# Patient Record
Sex: Female | Born: 1950 | Race: Black or African American | Hispanic: No | Smoking: Current every day smoker
Health system: Southern US, Community
[De-identification: ages and names within clinical notes are randomized; demographics above are authoritative.]

## PROBLEM LIST (undated history)

## (undated) DIAGNOSIS — I452 Bifascicular block: Secondary | ICD-10-CM

## (undated) DIAGNOSIS — M419 Scoliosis, unspecified: Secondary | ICD-10-CM

## (undated) DIAGNOSIS — F329 Major depressive disorder, single episode, unspecified: Secondary | ICD-10-CM

## (undated) DIAGNOSIS — G4700 Insomnia, unspecified: Secondary | ICD-10-CM

## (undated) DIAGNOSIS — I499 Cardiac arrhythmia, unspecified: Secondary | ICD-10-CM

## (undated) DIAGNOSIS — F32A Depression, unspecified: Secondary | ICD-10-CM

## (undated) DIAGNOSIS — F419 Anxiety disorder, unspecified: Secondary | ICD-10-CM

## (undated) DIAGNOSIS — I10 Essential (primary) hypertension: Secondary | ICD-10-CM

## (undated) DIAGNOSIS — I5032 Chronic diastolic (congestive) heart failure: Secondary | ICD-10-CM

## (undated) HISTORY — DX: Depression, unspecified: F32.A

## (undated) HISTORY — PX: BACK SURGERY: SHX140

## (undated) HISTORY — DX: Major depressive disorder, single episode, unspecified: F32.9

## (undated) HISTORY — PX: TONSILLECTOMY: SUR1361

## (undated) HISTORY — DX: Insomnia, unspecified: G47.00

## (undated) HISTORY — DX: Cardiac arrhythmia, unspecified: I49.9

## (undated) HISTORY — DX: Anxiety disorder, unspecified: F41.9

---

## 2004-08-18 ENCOUNTER — Ambulatory Visit: Payer: Self-pay | Admitting: Internal Medicine

## 2004-09-06 ENCOUNTER — Ambulatory Visit: Payer: Self-pay | Admitting: Internal Medicine

## 2007-09-19 ENCOUNTER — Ambulatory Visit: Payer: Self-pay | Admitting: Hospitalist

## 2007-09-19 ENCOUNTER — Encounter (INDEPENDENT_AMBULATORY_CARE_PROVIDER_SITE_OTHER): Payer: Self-pay | Admitting: Hospitalist

## 2007-09-19 ENCOUNTER — Ambulatory Visit: Payer: Self-pay | Admitting: Cardiovascular Disease

## 2007-09-19 ENCOUNTER — Inpatient Hospital Stay (HOSPITAL_COMMUNITY): Admission: EM | Admit: 2007-09-19 | Discharge: 2007-09-24 | Payer: Self-pay | Admitting: Emergency Medicine

## 2007-09-20 ENCOUNTER — Encounter (INDEPENDENT_AMBULATORY_CARE_PROVIDER_SITE_OTHER): Payer: Self-pay | Admitting: Hospitalist

## 2007-11-05 ENCOUNTER — Emergency Department (HOSPITAL_COMMUNITY): Admission: EM | Admit: 2007-11-05 | Discharge: 2007-11-05 | Payer: Self-pay | Admitting: Emergency Medicine

## 2007-11-13 ENCOUNTER — Inpatient Hospital Stay (HOSPITAL_COMMUNITY): Admission: EM | Admit: 2007-11-13 | Discharge: 2007-11-15 | Payer: Self-pay | Admitting: Emergency Medicine

## 2008-02-02 ENCOUNTER — Emergency Department (HOSPITAL_COMMUNITY): Admission: EM | Admit: 2008-02-02 | Discharge: 2008-02-02 | Payer: Self-pay | Admitting: Emergency Medicine

## 2008-04-21 ENCOUNTER — Emergency Department (HOSPITAL_COMMUNITY): Admission: EM | Admit: 2008-04-21 | Discharge: 2008-04-21 | Payer: Self-pay | Admitting: Emergency Medicine

## 2010-07-07 ENCOUNTER — Encounter: Admission: RE | Admit: 2010-07-07 | Discharge: 2010-07-07 | Payer: Self-pay | Admitting: Geriatric Medicine

## 2010-09-14 ENCOUNTER — Ambulatory Visit: Admit: 2010-09-14 | Payer: Self-pay | Admitting: Internal Medicine

## 2010-09-20 ENCOUNTER — Encounter (INDEPENDENT_AMBULATORY_CARE_PROVIDER_SITE_OTHER): Payer: Self-pay | Admitting: *Deleted

## 2010-09-20 ENCOUNTER — Ambulatory Visit
Admission: RE | Admit: 2010-09-20 | Discharge: 2010-09-20 | Payer: Self-pay | Source: Home / Self Care | Attending: Internal Medicine | Admitting: Internal Medicine

## 2010-09-27 ENCOUNTER — Ambulatory Visit: Admit: 2010-09-27 | Payer: Self-pay | Admitting: Internal Medicine

## 2010-09-29 NOTE — Letter (Signed)
Summary: New Patient letter  [facility]  [address] [place], [place] [address]   Phone: [phone]  Fax: [phone]       09/20/2010 MRN: [id number]  Heidi Blevins [address] [place], [place]  [address]  Dear Heidi Blevins,  Welcome to the Gastroenterology Division at [facility].    You are scheduled to see Dr.  Leone Payor on 10-20-10 at 2:30P.M. on the 3rd floor at [facility], [address]  We ask that you try to arrive at our office 15 minutes prior to your appointment time to allow for check-in.  We would like you to complete the enclosed self-administered evaluation form prior to your visit and bring it with you on the day of your appointment.  We will review it with you.  Also, please bring a complete list of all your medications or, if you prefer, bring the medication bottles and we will list them.  Please bring your insurance card so that we may make a copy of it.  If your insurance requires a referral to see a specialist, please bring your referral form from your primary care physician.  Co-payments are due at the time of your visit and may be paid by cash, check or credit card.     Your office visit will consist of a consult with your physician (includes a physical exam), any laboratory testing he/she may order, scheduling of any necessary diagnostic testing (e.g. x-ray, ultrasound, CT-scan), and scheduling of a procedure (e.g. Endoscopy, Colonoscopy) if required.  Please allow enough time on your schedule to allow for any/all of these possibilities.    If you cannot keep your appointment, please call [phone] to cancel or reschedule prior to your appointment date.  This allows [place] the opportunity to schedule an appointment for another patient in need of care.  If you do not cancel or reschedule by 5 p.m. the business day prior to your appointment date, you will be charged a $50.00 late cancellation/no-show fee.    Thank you for  choosing [facility] for your medical needs.  We appreciate the opportunity to care for you.  Please visit [place] at our website  to learn more about our practice.                     Sincerely,                                                             The Gastroenterology Division

## 2010-09-29 NOTE — Miscellaneous (Signed)
Summary: PV for colon-needs OV  Clinical Lists Changes   Pt is from assisted living facility.  Here for screening colon.  Pt on multiple psychotrophic drugs.  Also c/o severe abdominal bloating.  Colonoscopy cancelled and scheduled for new patient appt w/ Dr. Leone Payor for 10/20/2010 at 2:30.

## 2010-10-20 ENCOUNTER — Encounter: Payer: Self-pay | Admitting: Internal Medicine

## 2010-10-20 ENCOUNTER — Ambulatory Visit (INDEPENDENT_AMBULATORY_CARE_PROVIDER_SITE_OTHER): Payer: Medicare Other | Admitting: Internal Medicine

## 2010-10-20 ENCOUNTER — Encounter (INDEPENDENT_AMBULATORY_CARE_PROVIDER_SITE_OTHER): Payer: Self-pay | Admitting: *Deleted

## 2010-10-20 DIAGNOSIS — R141 Gas pain: Secondary | ICD-10-CM

## 2010-10-20 DIAGNOSIS — R142 Eructation: Secondary | ICD-10-CM

## 2010-10-20 DIAGNOSIS — R143 Flatulence: Secondary | ICD-10-CM | POA: Insufficient documentation

## 2010-10-20 DIAGNOSIS — Z1211 Encounter for screening for malignant neoplasm of colon: Secondary | ICD-10-CM

## 2010-10-25 NOTE — Letter (Signed)
Summary: [facility] Instructions  [facility]  [address] [place], [place] [address]   Phone: [phone]  Fax: [phone]       Heidi Blevins    [birth year]    MRN: [id number]        Procedure Day /Date:11/01/10 TUE     Arrival Time:1230 pm     Procedure Time:130 pm    Location of Procedure:                    X  [facility] (4th Floor)   PREPARATION FOR COLONOSCOPY WITH MOVIPREP   Starting 5 days prior to your procedure 10/27/10 do not eat nuts, seeds, popcorn, corn, beans, peas,  salads, or any raw vegetables.  Do not take any fiber supplements (e.g. Metamucil, Citrucel, and Benefiber).  THE DAY BEFORE YOUR PROCEDURE         DATE: 10/31/10  DAY:MON  1.  Drink clear liquids the entire day-NO SOLID FOOD  2.  Do not drink anything colored red or purple.  Avoid juices with pulp.  No orange juice.  3.  Drink at least 64 oz. (8 glasses) of fluid/clear liquids during the day to prevent dehydration and help the prep work efficiently.  CLEAR LIQUIDS INCLUDE: Water Jello Ice Popsicles Tea (sugar ok, no milk/cream) Powdered fruit flavored drinks Coffee (sugar ok, no milk/cream) Gatorade Juice: apple, white grape, white cranberry  Lemonade Clear bullion, consomm, broth Carbonated beverages (any kind) Strained chicken noodle soup Hard Candy                             4.  In the morning, mix first dose of MoviPrep solution:    Empty 1 Pouch A and 1 Pouch B into the disposable container    Add lukewarm drinking water to the top line of the container. Mix to dissolve    Refrigerate (mixed solution should be used within 24 hrs)  5.  Begin drinking the prep at 5:00 p.m. The MoviPrep container is divided by 4 marks.   Every 15 minutes drink the solution down to the next mark (approximately 8 oz) until the full liter is complete.   6.  Follow completed prep with 16 oz of clear liquid of your choice (Nothing red or purple).  Continue to drink  clear liquids until bedtime.  7.  Before going to bed, mix second dose of MoviPrep solution:    Empty 1 Pouch A and 1 Pouch B into the disposable container    Add lukewarm drinking water to the top line of the container. Mix to dissolve    Refrigerate  THE DAY OF YOUR PROCEDURE      DATE: 11/01/10 DAY: TUE  Beginning at 830 a.m. (5 hours before procedure):         1. Every 15 minutes, drink the solution down to the next mark (approx 8 oz) until the full liter is complete.  2. Follow completed prep with 16 oz. of clear liquid of your choice.    3. You may drink clear liquids until 1130 am (2 HOURS BEFORE PROCEDURE).   MEDICATION INSTRUCTIONS  Unless otherwise instructed, you should take regular prescription medications with a small sip of water   as early as possible the morning of your procedure.        OTHER INSTRUCTIONS  You will need a responsible adult at least 60 years of age to accompany you  and drive you home.   This person must remain in the waiting room during your procedure.  Wear loose fitting clothing that is easily removed.  Leave jewelry and other valuables at home.  However, you may wish to bring a book to read or  an iPod/MP3 player to listen to music as you wait for your procedure to start.  Remove all body piercing jewelry and leave at home.  Total time from sign-in until discharge is approximately 2-3 hours.  You should go home directly after your procedure and rest.  You can resume normal activities the  day after your procedure.  The day of your procedure you should not:   Drive   Make legal decisions   Operate machinery   Drink alcohol   Return to work  You will receive specific instructions about eating, activities and medications before you leave.    The above instructions have been reviewed and explained to me by   _______________________    I fully understand and can verbalize these instructions _____________________________  Date _________

## 2010-10-25 NOTE — Assessment & Plan Note (Signed)
Summary: colon cx.  needs OV for abd bloating, need for procedure.   History of Present Illness Visit Type: Initial Visit Primary GI MD: Stan Head MD [facility] Primary Provider: Florentina Jenny, MD Chief Complaint: Colon screening, abdominal bloating History of Present Illness:   60 yo African-American woman, resides in an assisted lving center. She is complaining of intermittent bloating and abdominal distention as well as gas pain. This started about 1 month ago. No medication changes then. Has never had this before. No diet change.  Staff member here says some days her abdomen is very distended. Passage of flatus will relieve the distention.     GI Review of Systems    Reports bloating.      Denies abdominal pain, acid reflux, belching, chest pain, dysphagia with liquids, dysphagia with solids, heartburn, loss of appetite, nausea, vomiting, vomiting blood, weight loss, and  weight gain.        Denies anal fissure, black tarry stools, change in bowel habit, constipation, diarrhea, diverticulosis, fecal incontinence, heme positive stool, hemorrhoids, irritable bowel syndrome, jaundice, light color stool, liver problems, rectal bleeding, and  rectal pain. Preventive Screening-Counseling & Management  Alcohol-Tobacco     Smoking Status: current      Drug Use:  no.      Current Medications (verified): 1)  Fluphenazine Hcl 5 Mg Tabs (Fluphenazine Hcl) .... 2 By Mouth At Bedtime 2)  Lithium Carbonate 450 Mg Cr-Tabs (Lithium Carbonate) .Marland Kitchen.. 1 By Mouth Two Times A Day 3)  Zyprexa 10 Mg Tabs (Olanzapine) .... 3 By Mouth At Bedtime 4)  Lorazepam 1 Mg Tabs (Lorazepam) .Marland Kitchen.. 1 By Mouth Three Times A Day 5)  Flonase 50 Mcg/act Susp (Fluticasone Propionate) .... Inhale 1 Spray Into Each Nostril Two Times A Day 6)  Vitamin D3 1000 Unit Tabs (Cholecalciferol) .Marland Kitchen.. 1 By Mouth Once Daily 7)  Cartia Xt 180 Mg Xr24h-Cap (Diltiazem Hcl Coated Beads) .Marland Kitchen.. 1 By Mouth Once Daily 8)  Cranberry 400 Mg  Caps (Cranberry) .Marland Kitchen.. 1 By Mouth Once Daily 9)  Zolpidem Tartrate 10 Mg Tabs (Zolpidem Tartrate) .Marland Kitchen.. 1 By Mouth At Bedtime 10)  Sertraline Hcl 100 Mg Tabs (Sertraline Hcl) .Marland Kitchen.. 1 By Mouth At Bedtime 11)  Nystatin 100000 Unit/gm Crea (Nystatin) .... Apply To Afected Area Twice Daily As Needed 12)  Mucinex 600 Mg Xr12h-Tab (Guaifenesin) .Marland Kitchen.. 1 By Mouth Every 12 Hours As Needed For Cough 13)  Acetaminophen 325 Mg Tabs (Acetaminophen) .... 2 By Mouth Every Six Hours As Needed 14)  Fluphenazine Hcl 5 Mg Tabs (Fluphenazine Hcl) .... Take 2 By Mouth  Every Morning 15)  Ibuprofen 600 Mg Tabs (Ibuprofen) .... Take 1 By Mouth Every 6 Hours As Needed For Back Pain  Allergies (verified): 1)  ! Sulfa  Past History:  Past Medical History: schizoaffective disorder insomnia  anxiety depression vit d def  Arrhythmia  Past Surgical History: Back Surgery - fell out of tree age 34  Family History: No FH of Colon Cancer:  Social History: Patient currently smokes.  Alcohol Use - no Daily Caffeine Use Illicit Drug Use - no Lives in assisted living, makes own decisions.Smoking Status:  current Drug Use:  no  Review of Systems       The patient complains of back pain, heart rhythm changes, and sleeping problems.         All other ROS negative except as per HPI.   Vital Signs:  Patient profile:   60 year old female Height:  64 inches Weight:      167.50 pounds BMI:     28.86 Pulse rate:   72 / minute Pulse rhythm:   regular BP sitting:   126 / 78  (left arm) Cuff size:   regular  Vitals Entered By: June McMurray CMA Duncan Dull) (October 20, 2010 2:26 PM)  Physical Exam  General:  Well developed, well nourished, no acute distress. Eyes:  PERRLA, no icterus. bilateral arcus Mouth:  dentures, otherwise clear Neck:  Supple; no masses or thyromegaly. Lungs:  Clear throughout to auscultation. Heart:  Regular rate and rhythm; no murmurs, rubs,  or bruits. Abdomen:  soft, decreased  abdominal wall tone, moderate diastasis recti BS+, no fluid wave, nontender, no HSM or mass Rectal:  deferred until time of colonoscopy.   Extremities:  no edema Neurologic:  Alert and  oriented x3 Psych:  speech slightly slow but oherwise she is appropriate and with normal affect   Impression & Recommendations:  Problem # 1:  ABDOMINAL BLOATING (ICD-787.3) Assessment New No red flags to report. Colonoscopy is for screening but will await that before further evaluation.  Problem # 2:  SCREENING, COLON CANCER (ICD-V76.51) Assessment: New  Risks, benefits,and indications of endoscopic procedure(s) were reviewed with the patient and all questions answered.  Orders: Colonoscopy (Colon)  Patient Instructions: 1)  Colonoscopy brochure given.  2)  You have been scheduled for a Colonoscopy for 11/01/10. 3)  Instructions have been provided. 4)  Your prescription for your Movi prep has been  printed and handed to you. 5)  The medication list was reviewed and reconciled.  All changed / newly prescribed medications were explained.  A complete medication list was provided to the patient / caregiver. Prescriptions: MOVIPREP 100 GM  SOLR (PEG-KCL-NACL-NASULF-NA ASC-C) As per prep instructions.  #1 x 0   Entered by:   Chales Abrahams CMA (AAMA)   Authorized by:   Iva Boop MD, [facility]   Signed by:   Chales Abrahams CMA (AAMA) on 10/20/2010   Method used:   Print then Give to Patient   RxID:   [id number] MOVIPREP 100 GM  SOLR (PEG-KCL-NACL-NASULF-NA ASC-C) As per prep instructions.  #1 x 0   Entered by:   Chales Abrahams CMA (AAMA)   Authorized by:   Iva Boop MD, [facility]   Signed by:   Chales Abrahams CMA (AAMA) on 10/20/2010   Method used:   Historical   RxID:   [id number]  cc: Dr. Florentina Jenny

## 2010-11-01 ENCOUNTER — Other Ambulatory Visit: Payer: Medicare Other | Admitting: Internal Medicine

## 2010-11-30 ENCOUNTER — Encounter: Payer: Self-pay | Admitting: Internal Medicine

## 2010-12-01 ENCOUNTER — Encounter: Payer: Self-pay | Admitting: Internal Medicine

## 2010-12-01 ENCOUNTER — Ambulatory Visit (AMBULATORY_SURGERY_CENTER): Payer: Medicare Other | Admitting: Internal Medicine

## 2010-12-01 VITALS — BP 126/76 | HR 74 | Temp 98.7°F | Resp 18 | Ht 65.0 in | Wt 166.0 lb

## 2010-12-01 DIAGNOSIS — D126 Benign neoplasm of colon, unspecified: Secondary | ICD-10-CM

## 2010-12-01 DIAGNOSIS — K635 Polyp of colon: Secondary | ICD-10-CM

## 2010-12-01 DIAGNOSIS — K5730 Diverticulosis of large intestine without perforation or abscess without bleeding: Secondary | ICD-10-CM

## 2010-12-01 DIAGNOSIS — Z1211 Encounter for screening for malignant neoplasm of colon: Secondary | ICD-10-CM

## 2010-12-01 MED ORDER — SODIUM CHLORIDE 0.9 % IV SOLN: 500.0000 mL | INTRAVENOUS | Status: DC

## 2010-12-01 MED ORDER — IBUPROFEN 600 MG PO TABS: 600.0000 mg | ORAL_TABLET | Freq: Four times a day (QID) | ORAL | Status: DC | PRN

## 2010-12-01 NOTE — Progress Notes (Signed)
Mild diverticulosis, polyp

## 2010-12-01 NOTE — Patient Instructions (Addendum)
6 polyps were removed today. They appear benign. No aspirin or NSAID-anti-inflammatory medications until December 15, 2010.

## 2010-12-01 NOTE — Progress Notes (Signed)
Hand off report to Eual Fines RN

## 2010-12-05 ENCOUNTER — Telehealth: Payer: Self-pay | Admitting: *Deleted

## 2010-12-05 NOTE — Telephone Encounter (Signed)

## 2010-12-12 ENCOUNTER — Encounter: Payer: Self-pay | Admitting: Internal Medicine

## 2010-12-12 NOTE — Progress Notes (Signed)
Quick Note:  5 adenomas so recall colonoscopy 3 years - 11/2013 ______

## 2011-01-10 NOTE — Procedures (Signed)
EEG NUMBER:  [id number].   HISTORY:  This is a 60 year old patient with episodes of altered mental  status with drowsiness.  Patient is being evaluated for possible seizure  events.  This is a routine EEG.  No skull defects were noted.   MEDICATIONS:  1. Protonix.  2. Lovenox.  3. Diflucan.  4. Zithromax.   EEG CLASSIFICATION:  Normal awake and drowsy.   DESCRIPTION OF RECORDING:  Background rhythm:  This recording consists  of a fairly well modulated medium amplitude alpha rhythm of 9 Hz that is  reactive to eye opening and closure.  As the record progresses, the  patient appears initially to be in an awakened state.  Off and on during  the recording, the patient seems to enter the drowsy state with a  dropout of the background rhythm activities and onset of 7 rate Hz  background slowing seen.  The patient never clearly enters stage II  sleep at any time.  Towards the end of the recording, photic stimulation  is performed resulting in a bilateral and symmetric photic drive  response.  Hyperventilation was not performed.  At no time during the  recording did it appear rhythms of spike wave discharges or evidence of  focal slowing.  EKG monitor shows no evidence of cardiac rhythm  abnormalities with a heart rate of 72.   IMPRESSION:  This is a normal EEG recording awake and drowsy state.  No  evidence of ictal or interictal discharges were seen.      Marlan Palau, M.D.  Electronically Signed     ZOX:WRUE  D:  09/20/2007 13:08:08  T:  09/20/2007 14:37:16  Job #:  [id number]

## 2011-01-10 NOTE — Consult Note (Signed)
NAMESHELBYLYNN, WALCZYK NO.:  [id number]   MEDICAL RECORD NO.:  [id number]          PATIENT TYPE:  INP   LOCATION:  1828                         FACILITY:  [facility]   PHYSICIAN:  Marlan Palau, M.D.  DATE OF BIRTH:  07-10-1951   DATE OF CONSULTATION:  09/20/2007  DATE OF DISCHARGE:                                 CONSULTATION   HISTORY OF PRESENT ILLNESS:  Heidi Blevins is a 60 year old, right-  handed, black female born 02-08-1951 with a history of schizoaffective  disorder on very large doses of medications.  The patient lives in a  group home setting.  The patient was noted to have onset of sudden  episodes of altered mental status that began around breakfast time on  the day of admission on January 22 and recurred today in the late  morning. The patient suddenly becomes sleepy,  lethargic and nonfocal  examination is noted.  The patient has had a CT scan of the brain on  admission that questions some small vessel disease but no acute changes  were seen.  The patient has been set up for an MRI scan of the brain but  this has not yet been done.  A 2-D echocardiogram has been done and  shows no cardiac source of embolus that is apparent.  Ejection fraction  60-65%.  A carotid Doppler study was done and preliminary results are  unremarkable.  An EEG study has been done but the results are pending at  this time.  Neurology is called to see this patient as a code stroke.  Code stroke was cancelled after initial evaluation revealed a nonfocal  examination.   PAST MEDICAL HISTORY:  1. Altered mental status and lethargy sudden onset, rule out seizure      versus TIA.  2. Schizoaffective disorder.  3. History of scoliosis.  4. Lumbosacral spine surgery in the past following a fall.  5. The patient smokes a pack of cigarettes a day, does not drink      alcohol.  6. Has no known allergies.   CURRENT MEDICATIONS:  1. Aspirin 325 mg a day.  2. Clozaril 400 mg  twice daily.  3. Lovenox 40 mg subcu q.12 h.  4. Diflucan 100 mg daily.  5. Lithium 450 mg q.12 h.  6. Ativan 1 mg q.h.s.  7. Protonix 40 mg daily.  8. Zoloft 100 mg daily.   SOCIAL HISTORY:  This patient is unmarried, has two children, both sons.  The patient is not employed.   FAMILY MEDICAL HISTORY:  Notable that mother is alive, has a history of  stroke and high blood pressure. Father died with peptic ulcer disease.  The patient has several brothers and sisters, one sister has lupus.  The  patient has two sisters that are in good health, a brother that has  hypertension.   PHYSICAL EXAMINATION:  VITAL SIGNS:  Blood pressure is 121/66, heart  rate 50, respiratory rate 16.  The patient is afebrile.  GENERAL:  This patient is a fairly well-developed black female who is  sleepy but can be aroused at the time  examination.  HEENT:  Head is atraumatic.  EYES:  Pupils are round, small pupils noted.  Disks are difficult to  visualize but appear to be flat.  NECK:  Supple. No carotid bruits noted.  RESPIRATORY:  Clear.  CARDIOVASCULAR:  Slow heart rate.  No obvious murmurs or rubs noted.  EXTREMITIES:  Without significant edema.  NEUROLOGIC:  Cranial nerves as above. There may be some slight  depression of the right nasolabial fold but the patient has relatively  symmetric smile and grimace. Extraocular movements are relatively full.  The patient blinks to threat bilaterally.  Has full visual fields.  The  patient has somewhat garbled speech pattern but not aphasic.  The  patient is able answer questions.   Motor testing reveals the patient has good motor function of the arms  and legs, tries to sit up and get out of bed on her own.  The patient  has no obvious asterixis, is able to perform finger-nose-finger, has  difficulty performing toe to finger due to lethargy. Deep tendon  reflexes depressed but symmetric.  Toes are neutral bilaterally.  The  patient reports symmetric  pinprick to soft touch and vibratory sensation  throughout.   REVIEW OF SYSTEMS:  Difficult to obtain but the patient reports some  mild right-sided headache.  Denies shortness of breath, chest pains,  abdominal pains, numbness and  weakness on the arms or legs.   LABORATORY VALUES:  Notable for white count of 8.5, hemoglobin 11.4,  hematocrit 34.1, MCV of 94.0, platelets of 258. The patient has a  cholesterol level of 125, HDL 42, LDL 76, VLDL of 7.  CK of 76, MB  fraction 1.7, troponin-I of 0.03, sodium 142, potassium 4.2, chloride  115, CO2 of 24, glucose of 85, BUN of 9, creatinine 0.71, total bili of  0.5, alk phosphatase 85, SGOT of 17, SGPT of 17, total protein 5.58,  albumin of 3.1, calcium 9.5.  Lithium level is within therapeutic range.   Urine drug screen was positive for benzodiazepines, otherwise negative.  Urinalysis reveals specific gravity of 1.008, pH of 6.5.   IMPRESSION:  1. Sudden recurring altered mental status, rule out seizures,      transient ischemic attacks are possible but less likely.  2. Schizoaffective disorder.   This patient is on high doses of medication that can be quite sedating.  The patient was on a combination Geodon 80 mg twice daily and Clozaril  400 mg twice daily as well as Ativan prior to this admission.  It is not  apparent that there have been any medication changes since the end of  October 2008. Clozaril in particular can be very sedating.  Given the  sudden onset of altered mental status that has been recurrent, seizures  are high on the list of differential diagnosis.  The patient is also  undergoing that of a TIA workup.   PLAN:  1. Would agree with the MRI of the brain and MRI angiogram.  2. EEG has been done and will need to follow-up with these results.      If the workup is unremarkable, would consider lowering the a.m.      dose of Clozaril. Lithium does appear to be within the therapeutic      range.  Will follow the  patient's clinical course while in-house.     Marlan Palau, M.D.  Electronically Signed    CKW/MEDQ  D:  09/20/2007  T:  09/20/2007  Job:  [id number]

## 2011-01-10 NOTE — Consult Note (Signed)
NAMESCHAE, CANDO           ACCOUNT NO.:  [id number]   MEDICAL RECORD NO.:  [id number]          PATIENT TYPE:  INP   LOCATION:  4729                         FACILITY:  [facility]   PHYSICIAN:  Veverly Fells. Excell Seltzer, MD  DATE OF BIRTH:  05/31/1951   DATE OF CONSULTATION:  09/20/2007  DATE OF DISCHARGE:                                 CONSULTATION   REASON FOR CONSULTATION:  Abnormal EKG with altered mental status.   HISTORY OF PRESENT ILLNESS:  Heidi Blevins is a 60 year old woman  who we were asked to see, because of because of right bundle branch  block with left anterior fascicular block on EKG.  There is a question  of whether she has had syncope.  Heidi Blevins is a 60 year old woman  with schizoaffective disorder who became unresponsive at a group meeting  and then had altered mental status for several hours.  She has been on  several new antipsychotic medications, and now that these have been  stopped, seems to be much improved with regard to her mental status.  Heidi Blevins has no history of cardiac disease.  She has not had a  previous EKG.  She denies past history of chest pain, dyspnea or  syncope.  She has no other acute complaints at this time.   PAST MEDICAL HISTORY:  Is pertinent for:  1. Schizoaffective disorder.  2. Scoliosis.  3. Tobacco abuse.   SOCIAL HISTORY:  The patient has a son who is very supportive.  She  lives in a group home.  She smokes cigarettes but does not drink  alcohol.   FAMILY HISTORY:  The patient's mother died of CVA.  She had  hypertension.  She has a brother with hypertension and a sister with  lupus.   REVIEW OF SYSTEMS:  A complete 12-point review of systems was performed.  There are no pertinent positives to report.   MEDICATIONS:  Currently are:  1. Protonix 40 mg IV daily.  2. Lovenox 40 mg sub-cu daily.  3. Aspirin 325 mg daily.  4. Diflucan 100 mg daily.  5. Zithromax 500 mg daily.  6. At home. she was taking Zoloft,  lorazepam, Geodon, lithium,      clozapine and Ambien.   ALLERGIES:  NKDA.   EXAMINATION:  GENERAL:  On exam, the patient is alert and oriented.  She  is in no acute distress.  She is a very pleasant woman.  VITAL SIGNS:  Blood pressure 131/78, heart rate 90, respiratory rate 18,  oxygen saturation is 99% on room air, temperature 97.9.  HEENT:  Normal.  NECK:  Normal carotid upstroke without bruits.  Jugular venous pressure  normal.  No thyromegaly or thyroid nodules.  LUNGS:  Clear to auscultation bilaterally.  BACK:  There is a midline scar from the cervical spine, all the way to  the lower lumbar spine.  There is no CVA tenderness.  CARDIAC:  The apex  is discreet nondisplaced.  HEART:  Regular rate rhythm without murmurs or gallops.  ABDOMEN:  Soft, nontender, no organomegaly, no bruits.  EXTREMITIES:  No clubbing, cyanosis or edema.  Peripheral pulses  2+,  stable throughout.  SKIN:  Warm, dry without rash.  Lymphatics no adenopathy.  NEUROLOGIC:  Cranial nerves II-XII intact.  Strength 5/5 and equal in  the arms and legs bilaterally.   EKG shows sinus rhythm with right bundle branch block and left anterior  fascicular block.   Echocardiogram shows normal LV size and systolic function, LVEF  estimated at 60% to 65%.  There are no regional wall motion  abnormalities.  There is no significant valvular disease.   ASSESSMENT:  1. Abnormal EKG with bifascicular block.  2. Altered mental status, likely secondary to psychotropic      medications.  3. Tobacco abuse.   Heidi Blevins is appears to be much improved, after change in her  medications.  Her EKG findings are nonspecific.  I have reviewed her  telemetry, and she has had no evidence of high-grade heart block or  significant brady or tachyarrhythmias.  Her normal echocardiogram is  reassuring.  I do not think she needs any other any further cardiac  workup at present.  If she has symptoms suggestive of true syncope, she   will certainly need to be assessed in the setting of her abnormal  conduction system.  AV nodal blocking agents should be avoided.      Veverly Fells. Excell Seltzer, MD  Electronically Signed     MDC/MEDQ  D:  09/20/2007  T:  09/21/2007  Job:  [id number]

## 2011-01-10 NOTE — Discharge Summary (Signed)
NAMEMORRIGAN, WICKENS           ACCOUNT NO.:  [id number]   MEDICAL RECORD NO.:  [id number]          PATIENT TYPE:  INP   LOCATION:  4729                         FACILITY:  [facility]   PHYSICIAN:  Eliseo Gum, M.D.   DATE OF BIRTH:  13-Apr-1951   DATE OF ADMISSION:  09/19/2007  DATE OF DISCHARGE:  09/24/2007                               DISCHARGE SUMMARY   CONTINUITY DOCTOR:  Primary care Dr. at nursing home.   CONSULTANTS:  1. Dr. Lesia Sago, neurology.  2. Dr. Tonny Bollman, cardiology.   DISCHARGE DIAGNOSES:  1. Altered mental status secondary to medications.  2. Schizoaffective disorder.  3. Scoliosis.  4. Fascicular block.   DISCHARGE MEDICATIONS:  1. Geodon 20 mg orally b.i.d.  2. Aspirin 325 mg p.o. daily.  3. Lithium 450 mg p.o. every 12 hours.  4. Lorazepam 1 mg p.o. q.h.s.  5. Zoloft 100 mg p.o. daily.  6. Miconazole apply b.i.d. for candida.  7. Metronidazole 500 mg p.o. twice a day for 7 days.  8. Zolpidem 10 mg p.o. at bedtime as needed.  9. Clozapine 200 mg p.o. b.i.d.   DISPOSITION ON FOLLOWUP:  Ms. Nana Vastine needs to be followed up  at the nursing home by her primary care physician.  They need to follow  up her resolution symptoms.  Also, she needs to be followed up with an  EKG to see the effect of her psych medications on the QT segment.   LABS THAT NEED TO BE FOLLOWED UP:  DNA probe for chlamydia and gonorrhea  down here in the hospital.  Also, the wet prep for Trichomonas, need to  be followed up.  Ms. Garside also she needs to be seen by her  psychiatry at the nursing home to readjust her dose of medication as  needed.   PROCEDURE PERFORMED:  1-Chest x-ray shows a stable cardiac silhouette.  There is interval improvement of the bilateral lower lobe which suggests  atelectasis, particularly of the lower lobe opacity.  There are small  bilateral pleural effusions.  There is mild interstitial edema.   2-MRI of the head shows no acute  infarct.  Mild atrophy.  Scattered  nonspecific white matter-type changes.  There is also ectatic flow  system with impression up on the undersurface of the hypothalamus and  mammillary body.  There is normal appearance of the left internal  carotid artery.  MRI of the brain, intracranial sclerotic type changes.  There is a possibility of left internal carotid artery stenosis at the  level of the carotid peripheral bifurcation is raised based on T1 axial  images.   3-Carotid Doppler, there is mild mixed plaque throughout bilaterally  vertebral artery and blood flow bilaterally.  No significant right ICA  stenosis noted.  No significant left ICA stenosis noted.   4-A 2D echo, overall left ventricular systolic function was normal.  Left ventricular ejection fraction was 60% to 65%.  There were no left  ventricular wall motion abnormalities.  Left ventricular wall thickening  was at the upper limits of normal.   HISTORY OF PRESENT ILLNESS:  Ms. Braylyn Eye is a  60 year old  female with past medical history of schizoaffective disorder who was  brought to the emergency department after being unresponsive while  having breakfast at nursing home.  She denies prior to the episode any  chest pain, dyspnea, palpitations.  She relates that she does not  remember what happened.  She only recalls waking up in the ED.  She is  also complaining of vaginal itching seen 1 week ago.   VITAL SIGNS ON ADMISSION:  Blood pressure 137/76.  Oxygen saturation 99%  on 2 L.  Pulse 59.  Respirations 18.  Temperature 98.7.   PHYSICAL EXAM:  GENERAL:  Patient was awake, able to answer and respond  to some questions.  EYES:  Pinpoint pupil.  No gaze periphery.  RESPIRATIONS:  Clear breath sounds.  Minor crackles at the base.  CARDIOVASCULAR:  S1 and S2.  Regular rhythm and rate.  No murmur.  GASTROINTESTINAL:  Soft, nontender, and nondistended.  NEUROLOGICAL EXAM:  Was not able to perform at the beginning  because  patient was awake and arousable but was not cooperating.   ADMISSION LABS:  Sodium 139, potassium 3.9, chloride 112, bicarb 21, BUN  8, creatinine 0.75, glucose 101, white blood cells 7.0, hemoglobin 13.3,  platelets 265, ANC 5.1, anion gap 6, bilirubin 0.7, alkaline phosphatase  109, ALT 21, AST 24, protein 6.7, albumin 4.1, calcium 10.1, ABG 7.35,  pH, CO2 of 44, oxygen 208, lithium level 0.86.  UA negative.  EKG,  patient had right bundle branch block, right axis deviation, normal sins  rhythm.   HOSPITAL COURSE:  1. Altered mental status versus syncope.  Ms. Shingler was admitted      and she had a workup for a syncope episode and for altered mental      status.  MRI, MRA, carotid Doppler, 2D echo were done as a result      as above.  Cardiology was consulted.  They decided not to do any      further intervention because on telemetry patient was sinus rhythm      and troponin and cardiac enzymes x3 were negative.  She was not      complaining of chest pain.  On the 4th day of hospitalization, Ms.      Antwine had another altered mental status changes in which she was      arousable but she appears sleeping, lethargic.  On telemetry during      that episode, she was sinus rhythm.  Blood pressure was stable at      126/70, oxygen saturation 95%.  The only thing that correlates with      that episode was that she received her dose of clozapine 30 minutes      before AMS.  A stroke code was called at that moment and it was      cancelled because the patient was not having any focal neurologic      findings.  EEG was reviewed and it did not show any sign of seizure      activity.  So patient's altered mental status was considered      secondary to medication.  Other cause of altered mental status was      ruled metabolic causes with normal electrolytes.  She was with      normal blood pressure.  She was not orthostatic.  Glucose level was      normal.  Other cause of syncope  was also ruled out like cardiac  cause.  She did not have any arrhythmia during telemetry, 2D echo      was normal.  MRI and MRA of the brain did not show any acute      infarct.   1. Vaginal candida versus bacterial vaginosis.  During      hospitalization, patient received treatment with fluconazole.  A      pelvic exam was performed and culture for DNA for chlamydia,      gonorrhea and wet prep was sent to the laboratory.  She is going to      be treated with metronidazole 500 mg p.o. twice a day for 7 days      empirically.  The dose of metronidazole needs to be changed if      there is Trichomonas vaginosis on the result of the test.   1. Schizoaffective disorder.  We were considering that the patient      altered mental status was secondary to her medication doses.  Dr.      Dorene Grebe was consulted.  Clozapine was changed to 200 mg p.o.      b.i.d.  Geodon was decreased to 20 mg orally b.i.d. because of some      changes on the EKG.  They need to be readjusted and increased as      needed after reviewing EKG because of prolonged Q/T.  We continued      the same dose of lithium and lorazepam 1 mg p.o. q.h.s.   DISCHARGE LABS AND VITAL SIGNS:  On the day of discharge, Ms. Giorgio  was alert, awake, and with no new complaints.  Vital signs, temperature  97.6, blood pressure 125/80, respirations 18, pulse 76.  Labs, sodium  142, potassium 4.2, chloride 111, CO2 of 27, glucose 94, BUN 9,  creatinine 0.84, white blood cells 7.3, hemoglobin 12.0, hematocrit  36.0, MCV 94.5, MCA 33.4, platelets 277.      Hartley Barefoot, MD  Electronically Signed      Eliseo Gum, M.D.  Electronically Signed    BR/MEDQ  D:  09/24/2007  T:  09/24/2007  Job:  [id number]

## 2011-01-10 NOTE — Discharge Summary (Signed)
NAMEGUDRUN, AXE           ACCOUNT NO.:  [id number]   MEDICAL RECORD NO.:  [id number]          PATIENT TYPE:  INP   LOCATION:  5503                         FACILITY:  [facility]   PHYSICIAN:  Wilson Singer, M.D.DATE OF BIRTH:  [birth year]   DATE OF ADMISSION:  11/13/2007  DATE OF DISCHARGE:  11/15/2007                               DISCHARGE SUMMARY   FINAL DISCHARGE DIAGNOSES:  1. Possible urinary tract infection.  2. Schizoaffective disorder.  3. Dementia.  4. Pneumonia ruled out.   MEDICATIONS ON DISCHARGE:  1. Lithium carbonate 450 mg b.i.d.  2. Ativan 1 mg daily.  3. Ativan 2 mg q.h.s.  4. Geodon 80 mg b.i.d.  5. Clozapine 400 mg b.i.d.  6. Ambien 10 mg q.h.s.  p.r.n.  7. Ciprofloxacin 500 mg b.i.d. for three days.   CONDITION ON DISCHARGE:  Stable.   HISTORY:  This 60 year old lady was admitted in a somewhat lethargic  state and the initial admitting diagnosis was a possible right upper  lobe pneumonia.  Please see initial history and physical examination  done by Dr. Lilly Cove.   HOSPITAL PROGRESS:  The patient's initial diagnosis was really in doubt  in terms of her clinical signs and symptoms, but based only on possible  chest x-ray findings.  My examination of her made me think that really  this was not the diagnosis and a repeat chest x-ray on November 14, 2007,  confirmed that there was no obvious pneumonia present, whatsoever.  However, the urine is growing more than 100,000 colonies of an  unidentified organism at the present time.  Therefore, she was started  on ciprofloxacin empirically, although she had been already on Avelox as  an antibiotic of choice for possible pneumonia.  Today, she is  clinically stable with no new complaints.  There is no cough or  shortness of breath.  On physical examination, temperature 97.9, blood  pressure 118/75, pulse 53, saturation 100% on room air.  Lung fields are  entirely clear.   FURTHER DISPOSITION:  The  plan is to discharge her to [facility]  again today with ciprofloxacin for three further days.  I suspect that  her lethargic state may be largely to do with medications, which may  need to be adjusted by the psychiatrist that looks after her.      Wilson Singer, M.D.  Electronically Signed     NCG/MEDQ  D:  11/15/2007  T:  11/15/2007  Job:  [id number]

## 2011-01-10 NOTE — H&P (Signed)
NAME:  Heidi Blevins, Heidi Blevins           ACCOUNT NO.:  [id number]   MEDICAL RECORD NO.:  [id number]          PATIENT TYPE:  INP   LOCATION:  5503                         FACILITY:  [facility]   PHYSICIAN:  Wilson Singer, M.D.DATE OF BIRTH:  Sep 20, 1950   DATE OF ADMISSION:  11/13/2007  DATE OF DISCHARGE:                              HISTORY & PHYSICAL   HISTORY:  This is a 60 year old African American female who has a  history of schizo-affective disorder and dementia and lives at [facility], who apparently was more confused today.  She denies any  shortness of breath, but does say she has a dry cough.  There is no  obvious fever that she admits to, nor was there one documented in the  emergency room record.  She unfortunately cannot give me any other  further clear history.   PAST MEDICAL HISTORY:  Significant for dementia and schizo-affective  disorder.   PAST SURGICAL HISTORY:  Scoliosis repair.   SOCIAL HISTORY:  She lives at [facility].  She currently smokes 4-  5 cigarettes per day.  She does not drink alcohol.   MEDICATIONS:  1. Zoloft 100 mg daily.  2. Ativan 1 mg daily.  3. Ativan 2 mg nightly.  4. Geodon 80 mg b.i.d.  5. Lithium 450 mg b.i.d.  6. Clonazepam 400 mg b.i.d.  7. Ambien 10 mg nightly p.r.n.   ALLERGIES:  NONE NOTED, BUT SHE SAYS SHE IS ALLERGIC TO SULFA DRUGS.   FAMILY HISTORY:  Noncontributory.   REVIEW OF SYSTEMS:  Apart from the symptoms mentioned above, there are  no other symptoms referable to all systems reviewed.  In fact, the  patient does not quite understand why she is here.   PHYSICAL EXAMINATION:  Temperature 98.5.  Blood pressure 124/64.  Pulse  72.  Respiratory rate 12 to 14.  Saturation 100%.  She looks  systemically well and not toxic.  She does not look confused or  lethargic at the present time, nor does she look toxic.  CARDIOVASCULAR:  Heart sounds present and normal, without any murmurs.  RESPIRATORY:  Lung fields are  clear.  Possibly some crackles at the  right lower zone.  ABDOMEN:  Soft, nontender with no hepatosplenomegaly.  NEUROLOGIC:  She is alert and oriented without any focal neurological  findings.   INVESTIGATIONS:  Chest x-ray showed the possibility of a right lower  lobe infiltrate.  Hemoglobin 12.3, white blood cell count 7.4.  Platelets 273.  Sodium 140, potassium 4.0, chloride 111, bicarbonate 22,  glucose 89, BUN 10, creatinine 0.75.  Alcohol level less than 5.  Urinalysis is unremarkable.  Lithium level is 0.91 in the normal range.   IMPRESSION:  1. Possible right lower lobe pneumonia, although clinically this is      not entirely convincing.  2. Schizo-affective disorder.  3. Dementia.   PLAN:  1. Admit.  2. Intravenous antibiotics.  3. Probable discharge soon based on hospital progress.      Wilson Singer, M.D.  Electronically Signed     NCG/MEDQ  D:  11/13/2007  T:  11/13/2007  Job:  [id number] 

## 2011-01-10 NOTE — Consult Note (Signed)
NAMERAMANDA, PAULES           ACCOUNT NO.:  [id number]   MEDICAL RECORD NO.:  [id number]          PATIENT TYPE:  INP   LOCATION:  4729                         FACILITY:  [facility]   PHYSICIAN:  Antonietta Breach, M.D.  DATE OF BIRTH:  [birth year]   DATE OF CONSULTATION:  09/23/2007  DATE OF DISCHARGE:                                 CONSULTATION   REQUESTING PHYSICIAN:  Eliseo Gum, M.D.   REASON FOR CONSULTATION:  Adverse psychotropic effects and  hallucinations.   HISTORY OF PRESENT ILLNESS:  Ms. Heidi Blevins is a 60 year old  female admitted to the [facility] on September 19, 2007.  The patient  has been experiencing for the past week sudden obtundation episodes that  are associated with her Clozaril dosing.  She also has been taking  Geodon, please see the discussion below.  Her Geodon was at 80 mg daily.  Her Clozaril was at 400 mg b.i.d. upon admission.  She also has been  taking lithium 450 mg b.i.d. as well as Zoloft 100 mg q.a.m. and Ativan  1 mg q.h.s.  The patient reports that although she has chronic auditory  hallucinations, she had not been experiencing any command destructive  auditory hallucinations.  The intensity of the hallucinations is low  enough that  she can enjoy a TV show as well as music and time with her  family.   Other than the possible relationship between Clozaril and her episodes,  she does not appear to be having any adverse psychotropic attacks by  history.  However, please see the discussion of EKG below.  At this  time, her Geodon has been discontinued and her Clozaril has been  decreased to 200 mg b.i.d.   PAST PSYCHIATRIC HISTORY:  The patient does have a long-term history of  severe auditory hallucination episodes.  She also has a history of mood  swings.  She has received Zoloft for anti-depression over the long-term.  She also has received Zoloft for antianxiety.  She is receiving Ativan 1  mg q.h.s. for anti-insomnia and  anti-acute anxiety.  She has been taking  lithium 400 mg b.i.d. as a mood stabilizer.  The patient reports that  she never has had a period of complete remission from auditory  hallucinations since she first began to experience psychotic episodes.  [facility]   FAMILY PSYCHIATRIC HISTORY:  None known.   SOCIAL HISTORY:  The patient has 2 sons.  She lives in a group home.  She smokes cigarettes.  She does not use any alcohol or illegal drugs.  Occupationally, she is retired.   PAST MEDICAL HISTORY:  1. Scoliosis.  2. History of lumbosacral surgery.  3. 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  On the original EKG at this admission was 509      milliseconds.  This was before the discontinuation of the Geodon.   REVIEW OF SYSTEMS:  Please see the review of systems.   ALLERGIES:  NO KNOWN DRUG ALLERGIES.   MEDICATIONS:  MAR is  reviewed.  Her psychotropics include:  1. Clozaril 200 mg b.i.d.  2. Lithium 450 mg b.i.d.  3. Atacand 1 mg q.h.s.  4. Zoloft 100 mg q.a.m.   DIAGNOSTICS:  An MR of the brain showed no infarct and mild atrophy.   LABORATORY DATA:  WBC 7.2, hemoglobin 12.1, platelet count 250, sodium  142, BUN 9, creatinine 0.77, SGOT 17, SGPT 17.  Urine drug screen  positive for benzodiazepines.  Lithium blood level was 0.86.   REVIEW OF SYSTEMS:  CONSTITUTIONAL/HEAD, EYES, EARS, NOSE AND THROAT:  Mouth unremarkable.  NEUROLOGIC:  Unremarkable.  PSYCHIATRIC:  As above.  CARDIOVASCULAR:  Cardiology has consulted and they assessed a  bifascicular block with no need for acute intervention.  The QTC of 509  was recorded prior to the discontinuation of the Geodon as well as prior  to the reduction of Clozaril.  VITAL SIGNS:  Temperature 100.0, pulse 73, respiratory rate 16, blood  pressure 94/56.  GENERAL APPEARANCE:  Heidi Blevins is a middle-aged female lying in a  supine position in her  hospital bed with no abnormal involuntary  movements.  MENTAL STATUS:  Heidi Blevins is alert.  She is oriented to all spheres.  Her attention span is within normal limits.  Her eye contact is good.  Her affect is slightly anxious.  Her mood is slightly anxious.  She is  oriented completely to all spheres.  Her memory is intact to immediate,  recent and remote.  Her speech involves normal repeat and prosody  without dysarthria.  The speech volume is mildly decreased.  Thought  process is logical, coherent, goal-directed.  No looseness of  association.  Fund of knowledge and intelligence are within normal  limits.  Language, expression and comprehension are intact.  Thought  content:  No thoughts of harming herself, no thoughts of harming others,  no delusions.  The hallucinations are of a mild level.  Please see the  history of present illness.  There are no command destructive  hallucinations.  Insight is intact.  Judgment is intact.   Respiratory, gastrointestinal, genitourinary, skin, musculoskeletal,  endocrine, metabolic hematologic, lymphatic all unremarkable.   ASSESSMENT:  AXIS I:  293.82.  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  The patient may have a form of  schizoaffective disorder.  She also may have a form of schizophrenia.  Typically patients with schizoaffective disorder will not have the  severe deterioration pattern in social and occupational functioning that  patients with schizophrenia have.  293.84.  Anxiety disorder not otherwise specified.  The patient has been  maintained on mood stabilization medication for the cyclic mood  component.  AXIS II:  None.  AXIS III:  See general medical section.  AXIS IV:  General medical.  AXIS V:  55.   Heidi Blevins is not at risk to harm herself or others.  She does agree  to call emergency services immediately for any thoughts of harming  herself, thoughts of harming others or other psychiatric  emergency  symptoms.   The undersigned provided ego supportive psychotherapy and education.  The patient does concur with the adjustments in her psychotropic  medication that are currently underway.   RECOMMENDATIONS:  1. Would continue with Clozaril at the current dosage 200 mg b.i.d.  2. Would recheck her EKG.  If the EKG QTC is less than 450 msc, would      increase her Clozaril back to the original dosage.  Would not  restart the Geodon.  3. Would also find it helpful if cardiology could provide some input      regarding managing her antipsychotics utilizing the patient's QTC.      For example, with the condition of  bifascicular block and damage      to the conduction system with the usual QTC parameters apply in      managing risks of antipsychotics and their adverse effects.  4. Would insure that a copy of this consultation as well as cardiology      and her general medical dictations are sent to her outpatient      psychiatrist for the next followup appointment.  5. Would ask the social worker to set this patient up with an      outpatient psychiatric  followup within the first week of discharge.  1. Would continue the patient's lithium as her primary mood      stabilizer, Ativan 1 mg q.h.s. for antianxiety and anti-insomnia,      Zoloft 100 mg q.a.m. for anti-depression and antianxiety.  2. As alluded to above, the management of her hallucinations will      involve the long-term ongoing judgment, weighing adverse effects,      particularly cardiologic, versus the likelihood of further      reduction of hallucinations.      Antonietta Breach, M.D.  Electronically Signed     JW/MEDQ  D:  09/24/2007  T:  09/24/2007  Job:  [id number]

## 2011-05-19 LAB — CBC
HCT: 33.9 — ABNORMAL LOW
HCT: 34.7 — ABNORMAL LOW
Hemoglobin: 12
Hemoglobin: 12.1
MCHC: 32.8
MCHC: 33.3
MCHC: 33.4
MCV: 94
MCV: 94.2
MCV: 94.3
Platelets: 259
Platelets: 261
RBC: 3.69 — ABNORMAL LOW
RBC: 3.8 — ABNORMAL LOW
RBC: 3.86 — ABNORMAL LOW
RDW: 13.5
RDW: 13.7
RDW: 13.7
RDW: 13.7
RDW: 13.7
WBC: 6.6
WBC: 7.3
WBC: 7.5

## 2011-05-19 LAB — RAPID URINE DRUG SCREEN, HOSP PERFORMED
Amphetamines: NOT DETECTED
Tetrahydrocannabinol: NOT DETECTED

## 2011-05-19 LAB — CULTURE, BLOOD (ROUTINE X 2): Culture: NO GROWTH

## 2011-05-19 LAB — BASIC METABOLIC PANEL
BUN: 10
BUN: 10
CO2: 24
Calcium: 10.1
Calcium: 9.9
Chloride: 112
Chloride: 113 — ABNORMAL HIGH
Creatinine, Ser: 0.66
Creatinine, Ser: 0.77
GFR calc Af Amer: >60
GFR calc Af Amer: >60
GFR calc Af Amer: >60
GFR calc non Af Amer: >60
GFR calc non Af Amer: >60
GFR calc non Af Amer: >60
Glucose, Bld: 106 — ABNORMAL HIGH
Glucose, Bld: 91
Potassium: 3.7
Potassium: 4.2
Sodium: 142
Sodium: 142
Sodium: 142

## 2011-05-19 LAB — LIPID PANEL
Cholesterol: 125
HDL: 42
LDL Cholesterol: 76
Total CHOL/HDL Ratio: 3
Triglycerides: 37
VLDL: 7

## 2011-05-19 LAB — DIFFERENTIAL
Eosinophils Absolute: 0
Eosinophils Absolute: 0
Eosinophils Relative: 0
Lymphocytes Relative: 21
Lymphocytes Relative: 24
Lymphocytes Relative: 26
Lymphs Abs: 1.4
Lymphs Abs: 1.7
Lymphs Abs: 2
Monocytes Relative: 10
Monocytes Relative: 7
Monocytes Relative: 9
Neutrophils Relative %: 66
Neutrophils Relative %: 72

## 2011-05-19 LAB — CARDIAC PANEL(CRET KIN+CKTOT+MB+TROPI)
CK, MB: 1.7
Relative Index: INVALID
Total CK: 71
Total CK: 76

## 2011-05-19 LAB — TROPONIN I: Troponin I: 0.02

## 2011-05-19 LAB — COMPREHENSIVE METABOLIC PANEL
ALT: 17
ALT: 24
AST: 17
AST: 21
Calcium: 10.1
Calcium: 9.5
Creatinine, Ser: 0.71
GFR calc Af Amer: >60
GFR calc Af Amer: >60
GFR calc non Af Amer: >60
Glucose, Bld: 101 — ABNORMAL HIGH
Glucose, Bld: 85
Sodium: 139
Sodium: 142
Total Protein: 5.5 — ABNORMAL LOW
Total Protein: 6.7

## 2011-05-19 LAB — POCT I-STAT 3, ART BLOOD GAS (G3+)
Acid-base deficit: 2
Bicarbonate: 24.2 — ABNORMAL HIGH
Operator id: 244801
TCO2: 25

## 2011-05-19 LAB — URINALYSIS, ROUTINE W REFLEX MICROSCOPIC
Bilirubin Urine: NEGATIVE
Ketones, ur: NEGATIVE
Nitrite: NEGATIVE
Specific Gravity, Urine: 1.008
Urobilinogen, UA: 0.2

## 2011-05-19 LAB — GC/CHLAMYDIA PROBE AMP, GENITAL: GC Probe Amp, Genital: NEGATIVE

## 2011-05-19 LAB — I-STAT 8, (EC8 V) (CONVERTED LAB)
Acid-base deficit: 5 — ABNORMAL HIGH
Chloride: 113 — ABNORMAL HIGH
HCT: 42
Hemoglobin: 14.3
Operator id: 198171
Potassium: 4
Sodium: 139
pH, Ven: 7.412 — ABNORMAL HIGH

## 2011-05-19 LAB — MAGNESIUM: Magnesium: 2.1

## 2011-05-19 LAB — PROTIME-INR: INR: 1

## 2011-05-19 LAB — WET PREP, GENITAL

## 2011-05-19 LAB — POCT I-STAT CREATININE: Operator id: 198171

## 2011-05-22 LAB — DIFFERENTIAL
Basophils Absolute: 0
Eosinophils Relative: 0
Lymphocytes Relative: 23
Lymphocytes Relative: 29
Lymphs Abs: 2.1
Monocytes Absolute: 0.4
Monocytes Relative: 10
Neutro Abs: 4.5
Neutrophils Relative %: 61

## 2011-05-22 LAB — COMPREHENSIVE METABOLIC PANEL
Albumin: 3.5
Alkaline Phosphatase: 88
BUN: 10
BUN: 9
Calcium: 10.1
Calcium: 10.5
Creatinine, Ser: 0.74
Creatinine, Ser: 0.78
Glucose, Bld: 89
Potassium: 4.2
Sodium: 140
Total Protein: 6.2
Total Protein: 6.2

## 2011-05-22 LAB — CBC
HCT: 36.4
HCT: 36.1
HCT: 36.6
Hemoglobin: 12.2
Hemoglobin: 12.3
MCHC: 33.5
MCHC: 33.7
Platelets: 252
RDW: 15.1
RDW: 14.2
RDW: 14.8

## 2011-05-22 LAB — POCT CARDIAC MARKERS
Myoglobin, poc: 31.1
Operator id: 234501

## 2011-05-22 LAB — BASIC METABOLIC PANEL
CO2: 25
Chloride: 112
GFR calc non Af Amer: >60
Glucose, Bld: 85
Potassium: 4.3
Sodium: 141

## 2011-05-22 LAB — LITHIUM LEVEL
Lithium Lvl: 0.78 — ABNORMAL LOW
Lithium Lvl: 0.91

## 2011-05-22 LAB — URINE CULTURE

## 2011-05-22 LAB — RAPID URINE DRUG SCREEN, HOSP PERFORMED
Benzodiazepines: POSITIVE — AB
Tetrahydrocannabinol: NOT DETECTED

## 2011-05-22 LAB — URINALYSIS, ROUTINE W REFLEX MICROSCOPIC
Glucose, UA: NEGATIVE
Hgb urine dipstick: NEGATIVE
Specific Gravity, Urine: 1.006

## 2011-05-25 LAB — POCT I-STAT, CHEM 8
BUN: 9
Calcium, Ion: 1.36 — ABNORMAL HIGH
Chloride: 111
Creatinine, Ser: 0.8
Glucose, Bld: 82
HCT: 38
Hemoglobin: 12.9
Potassium: 4.1
Sodium: 142
TCO2: 22

## 2011-05-25 LAB — URINE MICROSCOPIC-ADD ON

## 2011-05-25 LAB — URINALYSIS, ROUTINE W REFLEX MICROSCOPIC
Glucose, UA: NEGATIVE
Nitrite: NEGATIVE
Specific Gravity, Urine: 1.022
pH: 6.5

## 2011-05-31 ENCOUNTER — Other Ambulatory Visit: Payer: Self-pay | Admitting: Geriatric Medicine

## 2011-05-31 DIAGNOSIS — Z1231 Encounter for screening mammogram for malignant neoplasm of breast: Secondary | ICD-10-CM

## 2011-07-11 ENCOUNTER — Ambulatory Visit: Payer: Medicare Other

## 2011-08-03 ENCOUNTER — Ambulatory Visit: Payer: Medicare Other

## 2011-08-05 ENCOUNTER — Emergency Department (HOSPITAL_COMMUNITY): Payer: Medicare Other

## 2011-08-05 ENCOUNTER — Emergency Department (HOSPITAL_COMMUNITY)
Admission: EM | Admit: 2011-08-05 | Discharge: 2011-08-06 | Disposition: A | Discharge status: Home / Self Care | Payer: Medicare Other | Attending: Emergency Medicine | Admitting: Emergency Medicine

## 2011-08-05 ENCOUNTER — Encounter (HOSPITAL_COMMUNITY): Payer: Self-pay | Admitting: Emergency Medicine

## 2011-08-05 DIAGNOSIS — J069 Acute upper respiratory infection, unspecified: Secondary | ICD-10-CM | POA: Insufficient documentation

## 2011-08-05 DIAGNOSIS — R509 Fever, unspecified: Secondary | ICD-10-CM

## 2011-08-05 DIAGNOSIS — F3289 Other specified depressive episodes: Secondary | ICD-10-CM | POA: Insufficient documentation

## 2011-08-05 DIAGNOSIS — N390 Urinary tract infection, site not specified: Secondary | ICD-10-CM

## 2011-08-05 DIAGNOSIS — F329 Major depressive disorder, single episode, unspecified: Secondary | ICD-10-CM | POA: Insufficient documentation

## 2011-08-05 LAB — URINALYSIS, ROUTINE W REFLEX MICROSCOPIC
Hgb urine dipstick: NEGATIVE
Specific Gravity, Urine: 1.011 (ref 1.005–1.030)
Urobilinogen, UA: 1 mg/dL (ref 0.0–1.0)
pH: 7 (ref 5.0–8.0)

## 2011-08-05 LAB — CBC
HCT: 38 % (ref 36.0–46.0)
Hemoglobin: 12.4 g/dL (ref 12.0–15.0)
MCH: 30.2 pg (ref 26.0–34.0)
MCHC: 32.6 g/dL (ref 30.0–36.0)

## 2011-08-05 LAB — URINE MICROSCOPIC-ADD ON

## 2011-08-05 LAB — BASIC METABOLIC PANEL
BUN: 6 mg/dL (ref 6–23)
Creatinine, Ser: 0.7 mg/dL (ref 0.50–1.10)
GFR calc Af Amer: >90 mL/min (ref >90)
GFR calc non Af Amer: >90 mL/min (ref >90)

## 2011-08-05 LAB — DIFFERENTIAL
Basophils Relative: 0 % (ref 0–1)
Eosinophils Absolute: 0 10*3/uL (ref 0.0–0.7)
Monocytes Absolute: 0.3 10*3/uL (ref 0.1–1.0)
Monocytes Relative: 3 % (ref 3–12)

## 2011-08-05 LAB — RAPID STREP SCREEN (MED CTR MEBANE ONLY): Streptococcus, Group A Screen (Direct): NEGATIVE

## 2011-08-05 MED ORDER — DEXTROSE 5 % IV SOLN
1.0000 g | Freq: Once | INTRAVENOUS | Status: AC
  Administered 2011-08-05: 1 g via INTRAVENOUS
  Filled 2011-08-05: qty 10

## 2011-08-05 MED ORDER — SODIUM CHLORIDE 0.9 % IV BOLUS (SEPSIS)
500.0000 mL | Freq: Once | INTRAVENOUS | Status: AC
  Administered 2011-08-05: 500 mL via INTRAVENOUS

## 2011-08-05 MED ORDER — ACETAMINOPHEN 500 MG PO TABS
1000.0000 mg | ORAL_TABLET | Freq: Once | ORAL | Status: AC
  Administered 2011-08-05: 1000 mg via ORAL
  Filled 2011-08-05: qty 2

## 2011-08-05 MED ORDER — DEXTROSE 5 % IV SOLN: 1.0000 g | Freq: Once | INTRAVENOUS | Status: DC

## 2011-08-05 MED ORDER — SODIUM CHLORIDE 0.9 % IV SOLN
INTRAVENOUS | Status: DC
  Administered 2011-08-05: 23:00:00 via INTRAVENOUS

## 2011-08-05 MED ORDER — IBUPROFEN 200 MG PO TABS
400.0000 mg | ORAL_TABLET | Freq: Once | ORAL | Status: AC
  Administered 2011-08-05: 400 mg via ORAL
  Filled 2011-08-05: qty 2

## 2011-08-05 MED ORDER — CEPHALEXIN 500 MG PO CAPS: 500.0000 mg | ORAL_CAPSULE | Freq: Four times a day (QID) | ORAL | Status: AC

## 2011-08-05 NOTE — ED Notes (Signed)
Pt to ED with nausea/fever/cough cold symptoms that started today. Pt also with c/o general aches all over

## 2011-08-05 NOTE — ED Notes (Signed)
ZOX:WRUEA<VW> Expected date:08/05/11<BR> Expected time: 4:35 PM<BR> Means of arrival:Ambulance<BR> Comments:<BR> EMS 10 GC, 60 yof flu-like symptoms

## 2011-08-05 NOTE — ED Provider Notes (Signed)
History     CSN: [id number] Arrival date & time: 08/05/2011  5:00 PM   Chief Complaint  Patient presents with  . Fever   HPI Pt was seen at 1705.   Per pt, c/o gradual onset and persistence of constant generalized body aches/fatigue, runny/stuffy nose, sinus congestion, sore throat, nausea, home fevers/chills, and cough that started this morning.  Pt states others at her facility have similar symptoms.  Denies CP/SOB, no abd pain, no back pain, no vomiting/diarrhea, no rash.    Past Medical History  Diagnosis Date  . Schizoaffective disorder   . Insomnia   . Anxiety   . Arrhythmia   . Depression   . Vitamin D deficiency     Past Surgical History  Procedure Date  . Back surgery     History  Substance Use Topics  . Smoking status: Current Everyday Smoker -- 0.5 packs/day for 10 years    Types: Cigarettes  . Smokeless tobacco: Never Used  . Alcohol Use: No    Review of Systems ROS: Statement: All systems negative except as marked or noted in the HPI; Constitutional: +fever and chills. ; ; Eyes: Negative for eye pain, redness and discharge. ; ; ENMT: Negative for ear pain, hoarseness, +nasal congestion, sinus pressure and sore throat. ; ; Cardiovascular: Negative for chest pain, palpitations, diaphoresis, dyspnea and peripheral edema. ; ; Respiratory: +cough. Negative for wheezing and stridor. ; ; Gastrointestinal: +nausea.  Negative for vomiting, diarrhea, abdominal pain, blood in stool, hematemesis, jaundice and rectal bleeding. . ; ; Genitourinary: Negative for dysuria, flank pain and hematuria. ; ; Musculoskeletal: Negative for back pain and neck pain. Negative for swelling and trauma.; ; Skin: Negative for pruritus, rash, abrasions, blisters, bruising and skin lesion.; ; Neuro: +generalized body aches/fatigue.  Negative for headache, lightheadedness and neck stiffness. Negative for altered level of consciousness , altered mental status, extremity weakness, paresthesias,  involuntary movement, seizure and syncope.     Allergies  Sulfonamide derivatives  Home Medications   Current Outpatient Rx  Name Route Sig Dispense Refill  . ACETAMINOPHEN 325 MG PO TABS Oral Take 650 mg by mouth every 6 (six) hours as needed.      Marland Kitchen VITAMIN D 1000 UNITS PO TABS Oral Take 1,000 Units by mouth daily.      Marland Kitchen CRANBERRY 400 MG PO TABS Oral Take 400 mg by mouth daily.      Marland Kitchen DILTIAZEM HCL ER COATED BEADS 180 MG PO CP24 Oral Take 180 mg by mouth daily.      Marland Kitchen FLUPHENAZINE HCL PO Oral Take 5 mg by mouth at bedtime. 2 tabs     . FLUTICASONE PROPIONATE 50 MCG/ACT NA SUSP Nasal 1 spray by Nasal route 2 (two) times daily.      . GUAIFENESIN ER 600 MG PO TB12 Oral Take 600 mg by mouth 2 (two) times daily.      . IBUPROFEN 600 MG PO TABS Oral Take 1 tablet (600 mg total) by mouth every 6 (six) hours as needed. DO NOT TAKE AGAIN UNTIL December 15, 2010 30 tablet   . LITHIUM CARBONATE PO Oral Take 440 mg by mouth daily.      Marland Kitchen LORAZEPAM 1 MG PO TABS Oral Take 1 mg by mouth every 8 (eight) hours.      . NYSTATIN 100000 UNIT/GM EX CREA Topical Apply 1 application topically 2 (two) times daily.      Marland Kitchen OLANZAPINE 10 MG PO TABS Oral Take 10 mg by  mouth at bedtime. 3 tabs      . SERTRALINE HCL 100 MG PO TABS Oral Take 100 mg by mouth at bedtime.      Marland Kitchen ZOLPIDEM TARTRATE 10 MG PO TABS Oral Take 10 mg by mouth at bedtime as needed.        BP 147/71  Pulse 93  Temp(Src) 101.1 F (38.4 C) (Oral)  Resp 24  SpO2 93%  Physical Exam 1710: Physical examination:  Nursing notes reviewed; Vital signs and O2 SAT reviewed;  Constitutional: Well developed, Well nourished, Well hydrated, In no acute distress; Head:  Normocephalic, atraumatic; Eyes: EOMI, PERRL, No scleral icterus; ENMT: Mouth and pharynx normal, Mucous membranes moist; Neck: Supple, Full range of motion, No lymphadenopathy; Cardiovascular: Regular rate and rhythm, No murmur, rub, or gallop; Respiratory: Breath sounds coarse & equal  bilaterally, No wheezes, +non-productive cough during exam, Normal respiratory effort/excursion; Chest: Nontender, Movement normal; Abdomen: Soft, Nontender, Nondistended, Normal bowel sounds; Extremities: Pulses normal, No tenderness, No edema, No calf edema or asymmetry.; Neuro: AA&Ox3, Major CN grossly intact. No gross focal motor or sensory deficits in extremities.; Skin: Color normal, Warm, Dry, no rash, no petechiae.    ED Course  Procedures   MDM  MDM Reviewed: nursing note and vitals Interpretation: labs and x-ray   Results for orders placed during the hospital encounter of 08/05/11  URINALYSIS, ROUTINE W REFLEX MICROSCOPIC      Component Value Range   Color, Urine YELLOW  YELLOW    APPearance CLOUDY (*) CLEAR    Specific Gravity, Urine 1.011  1.005 - 1.030    pH 7.0  5.0 - 8.0    Glucose, UA NEGATIVE  NEGATIVE (mg/dL)   Hgb urine dipstick NEGATIVE  NEGATIVE    Bilirubin Urine NEGATIVE  NEGATIVE    Ketones, ur NEGATIVE  NEGATIVE (mg/dL)   Protein, ur NEGATIVE  NEGATIVE (mg/dL)   Urobilinogen, UA 1.0  0.0 - 1.0 (mg/dL)   Nitrite POSITIVE (*) NEGATIVE    Leukocytes, UA TRACE (*) NEGATIVE   RAPID STREP SCREEN      Component Value Range   Streptococcus, Group A Screen (Direct) NEGATIVE  NEGATIVE   URINE MICROSCOPIC-ADD ON      Component Value Range   Squamous Epithelial / LPF RARE  RARE    WBC, UA 0-2  <3 (WBC/hpf)   RBC / HPF 0-2  <3 (RBC/hpf)   Bacteria, UA MANY (*) RARE   LACTIC ACID, PLASMA      Component Value Range   Lactic Acid, Venous 0.8  0.5 - 2.2 (mmol/L)  CBC      Component Value Range   WBC 11.5 (*) 4.0 - 10.5 (K/uL)   RBC 4.11  3.87 - 5.11 (MIL/uL)   Hemoglobin 12.4  12.0 - 15.0 (g/dL)   HCT 16.1  09.6 - 04.5 (%)   MCV 92.5  78.0 - 100.0 (fL)   MCH 30.2  26.0 - 34.0 (pg)   MCHC 32.6  30.0 - 36.0 (g/dL)   RDW 40.9  81.1 - 91.4 (%)   Platelets 320  150 - 400 (K/uL)  DIFFERENTIAL      Component Value Range   Neutrophils Relative 94 (*) 43 - 77 (%)    Neutro Abs 10.7 (*) 1.7 - 7.7 (K/uL)   Lymphocytes Relative 4 (*) 12 - 46 (%)   Lymphs Abs 0.4 (*) 0.7 - 4.0 (K/uL)   Monocytes Relative 3  3 - 12 (%)   Monocytes Absolute 0.3  0.1 -  1.0 (K/uL)   Eosinophils Relative 0  0 - 5 (%)   Eosinophils Absolute 0.0  0.0 - 0.7 (K/uL)   Basophils Relative 0  0 - 1 (%)   Basophils Absolute 0.0  0.0 - 0.1 (K/uL)  BASIC METABOLIC PANEL      Component Value Range   Sodium 138  135 - 145 (mEq/L)   Potassium 3.6  3.5 - 5.1 (mEq/L)   Chloride 106  96 - 112 (mEq/L)   CO2 22  19 - 32 (mEq/L)   Glucose, Bld 151 (*) 70 - 99 (mg/dL)   BUN 6  6 - 23 (mg/dL)   Creatinine, Ser 1.61  0.50 - 1.10 (mg/dL)   Calcium 09.6 (*) 8.4 - 10.5 (mg/dL)   GFR calc non Af Amer >90  >90 (mL/min)   GFR calc Af Amer >90  >90 (mL/min)   Dg Chest 2 View  08/05/2011  *RADIOLOGY REPORT*  Clinical Data: Smoker with cough and shortness of breath.  CHEST - 2 VIEW 08/05/2011:  Comparison: Portable chest x-ray 11/14/2007 and two-view chest x- ray 09/20/2007 [facility].  Findings: Severe thoracic scoliosis convex right with Harrington rod.  Linear atelectasis in the inferior right upper lobe and in the left lower lobe.  No confluent airspace consolidation. Pulmonary vascularity normal.  No visible pleural effusions. Cardiac silhouette mildly enlarged but stable.  Thoracic aorta tortuous, unchanged.  Hilar and mediastinal contours otherwise unremarkable.  IMPRESSION: Linear atelectasis in the right upper lobe and in the left lower lobe.  No acute cardiopulmonary disease otherwise.  Original Report Authenticated By: Arnell Sieving, M.D.     10:03 PM:  Fever improved after APAP.  Lactic acid normal.  +UTI, UC pending.  Will dose IV rocephin now, rx keflex.  VS remain stable.  Family at bedside states "she and her roommate keep passing this cold back and forth."  Want to go back to facility.  Dx testing d/w pt and family.  Questions answered.  Verb understanding, agreeable to d/c  home with outpt f/u.       Larue Drawdy Allison Quarry, DO 08/07/11 1606

## 2011-08-06 NOTE — ED Notes (Signed)
Discharge instructions given to son, son verbalized understanding discharge instructions

## 2011-08-08 LAB — URINE CULTURE: Colony Count: >=100000

## 2011-08-09 NOTE — ED Notes (Signed)
+   urine Patient treated with keflex-sensitive to same-chart appended per protocol MD. 

## 2011-10-19 ENCOUNTER — Ambulatory Visit: Payer: Medicare Other

## 2013-12-04 ENCOUNTER — Encounter: Payer: Self-pay | Admitting: Internal Medicine

## 2014-05-15 ENCOUNTER — Encounter: Payer: Self-pay | Admitting: Internal Medicine

## 2016-05-01 ENCOUNTER — Emergency Department (HOSPITAL_COMMUNITY)
Admission: EM | Admit: 2016-05-01 | Discharge: 2016-05-01 | Disposition: A | Discharge status: Home / Self Care | Payer: Medicare Other | Source: Home / Self Care | Attending: Emergency Medicine | Admitting: Emergency Medicine

## 2016-05-01 ENCOUNTER — Emergency Department (HOSPITAL_COMMUNITY): Payer: Medicare Other

## 2016-05-01 ENCOUNTER — Encounter (HOSPITAL_COMMUNITY): Payer: Self-pay | Admitting: Emergency Medicine

## 2016-05-01 DIAGNOSIS — I10 Essential (primary) hypertension: Secondary | ICD-10-CM

## 2016-05-01 DIAGNOSIS — Y92129 Unspecified place in nursing home as the place of occurrence of the external cause: Secondary | ICD-10-CM | POA: Insufficient documentation

## 2016-05-01 DIAGNOSIS — Y939 Activity, unspecified: Secondary | ICD-10-CM | POA: Insufficient documentation

## 2016-05-01 DIAGNOSIS — Z79899 Other long term (current) drug therapy: Secondary | ICD-10-CM

## 2016-05-01 DIAGNOSIS — F17210 Nicotine dependence, cigarettes, uncomplicated: Secondary | ICD-10-CM | POA: Insufficient documentation

## 2016-05-01 DIAGNOSIS — Y999 Unspecified external cause status: Secondary | ICD-10-CM

## 2016-05-01 DIAGNOSIS — W1839XA Other fall on same level, initial encounter: Secondary | ICD-10-CM | POA: Insufficient documentation

## 2016-05-01 DIAGNOSIS — R531 Weakness: Secondary | ICD-10-CM | POA: Insufficient documentation

## 2016-05-01 DIAGNOSIS — R410 Disorientation, unspecified: Secondary | ICD-10-CM | POA: Insufficient documentation

## 2016-05-01 DIAGNOSIS — W19XXXA Unspecified fall, initial encounter: Secondary | ICD-10-CM

## 2016-05-01 LAB — CBC WITH DIFFERENTIAL/PLATELET
BASOS ABS: 0 10*3/uL (ref 0.0–0.1)
Basophils Relative: 0 %
EOS ABS: 0.2 10*3/uL (ref 0.0–0.7)
Eosinophils Relative: 3 %
HCT: 37.6 % (ref 36.0–46.0)
HEMOGLOBIN: 12 g/dL (ref 12.0–15.0)
LYMPHS ABS: 1.7 10*3/uL (ref 0.7–4.0)
LYMPHS PCT: 23 %
MCH: 29.8 pg (ref 26.0–34.0)
MCHC: 31.9 g/dL (ref 30.0–36.0)
MCV: 93.3 fL (ref 78.0–100.0)
Monocytes Absolute: 0.6 10*3/uL (ref 0.1–1.0)
Monocytes Relative: 8 %
NEUTROS PCT: 66 %
Neutro Abs: 4.9 10*3/uL (ref 1.7–7.7)
Platelets: 260 10*3/uL (ref 150–400)
RBC: 4.03 MIL/uL (ref 3.87–5.11)
RDW: 13.1 % (ref 11.5–15.5)
WBC: 7.4 10*3/uL (ref 4.0–10.5)

## 2016-05-01 LAB — COMPREHENSIVE METABOLIC PANEL
ALK PHOS: 88 U/L (ref 38–126)
ALT: 26 U/L (ref 14–54)
AST: 23 U/L (ref 15–41)
Albumin: 4.1 g/dL (ref 3.5–5.0)
Anion gap: 6 (ref 5–15)
BUN: 8 mg/dL (ref 6–20)
CALCIUM: 10.7 mg/dL — AB (ref 8.9–10.3)
CO2: 22 mmol/L (ref 22–32)
CREATININE: 0.87 mg/dL (ref 0.44–1.00)
Chloride: 110 mmol/L (ref 101–111)
GFR calc non Af Amer: >60 mL/min (ref >60)
GLUCOSE: 96 mg/dL (ref 65–99)
Potassium: 3.8 mmol/L (ref 3.5–5.1)
SODIUM: 138 mmol/L (ref 135–145)
Total Bilirubin: 0.6 mg/dL (ref 0.3–1.2)
Total Protein: 6.6 g/dL (ref 6.5–8.1)

## 2016-05-01 LAB — URINALYSIS, ROUTINE W REFLEX MICROSCOPIC
BILIRUBIN URINE: NEGATIVE
Glucose, UA: NEGATIVE mg/dL
Hgb urine dipstick: NEGATIVE
Ketones, ur: NEGATIVE mg/dL
NITRITE: NEGATIVE
Protein, ur: NEGATIVE mg/dL
SPECIFIC GRAVITY, URINE: 1.012 (ref 1.005–1.030)
pH: 7 (ref 5.0–8.0)

## 2016-05-01 LAB — URINE MICROSCOPIC-ADD ON: RBC / HPF: NONE SEEN RBC/hpf (ref 0–5)

## 2016-05-01 LAB — LITHIUM LEVEL: Lithium Lvl: 0.68 mmol/L (ref 0.60–1.20)

## 2016-05-01 NOTE — ED Triage Notes (Signed)
Per EMS,  Patient from skilled nursing facility ([facility]). Pt was going to smoking area and she fell outside on patio.  Pt states that she also fell in same area yesterday.  EMS states that no weakness in legs or arms. Speech slurred since yesterday.    Patient does complain of numbness in left arm but says that she has had numbness for a while.  Vital signs stable. 18 G IV in left wrist.

## 2016-05-01 NOTE — ED Provider Notes (Signed)
[facility] DEPT Provider Note   CSN: [id number] Arrival date & time: 05/01/16  1408     History   Chief Complaint Chief Complaint  Patient presents with  . Fall    HPI Heidi Blevins is a 65 y.o. female.  HPI Patient is a 65 year old female past medical history of anxiety and depression schizoaffective disorder currently treated on lithium who comes in today after suffering a fall at her care facility. Patient states that she had a fall yesterday the facility however she did not have any complaints. Patient had a second fall today on her way to the smoking area again, additionally nursing facility staff state the patient's filled I will use on herself last night and made no effort to clean it up or acknowledge it. Patient has recent history of a UTI. Patient denies fevers chills nausea vomiting but does endorse some left upper extremity numbness. Patient denies chest pain or shortness of breath. Past Medical History:  Diagnosis Date  . Anxiety   . Arrhythmia   . Bifascicular block   . Depression   . Hypertension   . Insomnia   . Schizoaffective disorder   . Scoliosis   . Vitamin D deficiency     Patient Active Problem List   Diagnosis Date Noted  . Complicated UTI (urinary tract infection) 05/03/2016  . Hypercalcemia 05/03/2016  . Dehydration 05/03/2016  . Acute encephalopathy 05/03/2016  . 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 Schizoaffective disorder ([place]) 05/03/2016  . Bifascicular block 05/03/2016    Past Surgical History:  Procedure Laterality Date  . BACK SURGERY     "as a child after she fell out of a tree"  . TONSILLECTOMY      OB History    No data available       Home Medications    Prior to Admission medications   Medication Sig Start Date End Date Taking? Authorizing Provider  acetaminophen (TYLENOL) 325 MG tablet Take 650 mg by mouth every 6 (six) hours as needed. For fever/pain.    Historical Provider, MD  guaifenesin (ROBITUSSIN)  100 MG/5ML syrup Take 200 mg by mouth every 6 (six) hours as needed for cough.    Historical Provider, MD  hydrOXYzine (ATARAX/VISTARIL) 50 MG tablet Take 100 mg by mouth 3 (three) times daily.    Historical Provider, MD  lisinopril (PRINIVIL,ZESTRIL) 5 MG tablet Take 5 mg by mouth daily.    Historical Provider, MD  metoprolol tartrate (LOPRESSOR) 25 MG tablet Take 12.5 mg by mouth 2 (two) times daily.    Historical Provider, MD  risperiDONE (RISPERDAL) 3 MG tablet Take 3 mg by mouth 2 (two) times daily.    Historical Provider, MD  sertraline (ZOLOFT) 100 MG tablet Take 150 mg by mouth at bedtime.     Historical Provider, MD    Family History Family History  Problem Relation Age of Onset  . CVA Mother   . Hypertension Mother   . Lupus Sister   . Hypertension Brother   . Peptic Ulcer Disease Father   . Colon cancer Neg Hx     Social History Social History  Substance Use Topics  . Smoking status: Current Every Day Smoker    Packs/day: 0.25    Years: 40.00    Types: Cigarettes  . Smokeless tobacco: Never Used  . Alcohol use No     Allergies   Sulfonamide derivatives   Review of Systems Review of Systems  Constitutional: Negative for chills and fever.  Respiratory: Negative  for shortness of breath.   Cardiovascular: Negative for chest pain.  Gastrointestinal: Negative for abdominal pain.  Neurological: Positive for numbness. Negative for dizziness, weakness and light-headedness.  Psychiatric/Behavioral: Positive for confusion. Negative for agitation and behavioral problems.  All other systems reviewed and are negative.    Physical Exam Updated Vital Signs BP 146/80   Pulse 71   Temp 98.2 F (36.8 C) (Oral)   Resp 24   Ht 5\' 5"  (1.651 m)   SpO2 97%   Physical Exam  Constitutional: She is oriented to person, place, and time. She appears well-developed and well-nourished. No distress.  HENT:  Head: Normocephalic and atraumatic.  Eyes: Conjunctivae are normal.    Neck: Neck supple.  Cardiovascular: Normal rate and regular rhythm.   No murmur heard. Pulmonary/Chest: Effort normal and breath sounds normal. No respiratory distress.  Abdominal: Soft. There is no tenderness.  Musculoskeletal: She exhibits no edema.  Neurological: She is alert and oriented to person, place, and time. No cranial nerve deficit.  Skin: Skin is warm and dry.  Psychiatric: She has a normal mood and affect.  Nursing note and vitals reviewed.    ED Treatments / Results  Labs (all labs ordered are listed, but only abnormal results are displayed) Labs Reviewed  URINE CULTURE - Abnormal; Notable for the following:       Result Value   Culture MULTIPLE SPECIES PRESENT, SUGGEST RECOLLECTION (*)    All other components within normal limits  URINALYSIS, ROUTINE W REFLEX MICROSCOPIC (NOT AT [facility]) - Abnormal; Notable for the following:    APPearance CLOUDY (*)    Leukocytes, UA MODERATE (*)    All other components within normal limits  COMPREHENSIVE METABOLIC PANEL - Abnormal; Notable for the following:    Calcium 10.7 (*)    All other components within normal limits  URINE MICROSCOPIC-ADD ON - Abnormal; Notable for the following:    Squamous Epithelial / LPF 0-5 (*)    Bacteria, UA FEW (*)    All other components within normal limits  CBC WITH DIFFERENTIAL/PLATELET  LITHIUM LEVEL    EKG  EKG Interpretation  Date/Time:  Monday May 01 2016 14:19:45 EDT Ventricular Rate:  65 PR Interval:    QRS Duration: 162 QT Interval:  468 QTC Calculation: 487 R Axis:   -73 Text Interpretation:  Normal sinus rhythm RBBB and LAFB similar to 2009 Confirmed by GOLDSTON MD, [place] [id number]) on 05/01/2016 3:51:22 PM Also confirmed by Regenia Skeeter MD, [place] [phone]), editor Gilford Rile, CCT, [place] ([id number])  on 05/01/2016 4:27:54 PM       Radiology Dg Chest 2 View  Result Date: 05/03/2016 CLINICAL DATA:  Weakness.  Altered mental status . EXAM: CHEST  2 VIEW COMPARISON:  05/01/2016.   08/05/2011 . FINDINGS: Heart size stable. Mild right base subsegmental atelectasis and or infiltrate. Rounded density projected over the right perihilar region most likely pulmonary vessel on end. Follow-up chest x-rays recommended to demonstrate resolution of these findings. No pleural effusion or pneumothorax. Thoracic spine scoliosis. Prior thoracolumbar spine spine fusion. IMPRESSION: 1. Mild right base subsegmental atelectasis and or infiltrate. Rounded density projected the right perihilar region, most likely a pulmonary vessel on end. Follow-up chest x-rays recommended to demonstrate resolution of these findings. 2. Thoracolumbar spine scoliosis with thoracolumbar spine fusion again noted . Electronically Signed   By: Marcello Moores  Register   On: 05/03/2016 16:18   Ct Head Wo Contrast  Result Date: 05/03/2016 CLINICAL DATA:  Altered mental status. Weakness starting 2 days ago.  Fall. Right arm pain. EXAM: CT HEAD WITHOUT CONTRAST TECHNIQUE: Contiguous axial images were obtained from the base of the skull through the vertex without intravenous contrast. COMPARISON:  05/01/2016 FINDINGS: Brain: There is difficulty with head positioning resulting in asymmetry of slice position between right and left sides. Small stable remote lacunar infarct in the right lentiform nucleus, image 16/3. Periventricular white matter and corona radiata hypodensities favor chronic ischemic microvascular white matter disease. Otherwise, the brainstem, cerebellum, cerebral peduncles, thalami, basal ganglia, basilar cisterns, and ventricular system appear within normal limits. No intracranial hemorrhage, mass lesion, or acute CVA. Vascular: There is atherosclerotic calcification of the cavernous carotid arteries bilaterally. Skull: Unremarkable Sinuses/Orbits: Mild chronic ethmoid sinusitis. Mild chronic left frontal sinusitis. Other: Large plugs of cerumen (2.0 cm in length on the left and 1.6 cm in length on the right) appear to  completely occlude the external auditory canals bilaterally, and likely interfere with auditory acuity. IMPRESSION: 1. No acute intracranial findings. 2. Periventricular white matter and corona radiata hypodensities favor chronic ischemic microvascular white matter disease. 3. Small remote lacunar infarct in the right lentiform nucleus. 4. Very large plugs of cerumen completely occlude the external auditory canals bilaterally, and likely interfere with auditory acuity. Electronically Signed   By: Van Clines M.D.   On: 05/03/2016 15:53    Procedures Procedures (including critical care time)  Medications Ordered in ED Medications - No data to display   Initial Impression / Assessment and Plan / ED Course  I have reviewed the triage vital signs and the nursing notes.  Pertinent labs & imaging results that were available during my care of the patient were reviewed by me and considered in my medical decision making (see chart for details).  Clinical Course   Patient is a 65 year old female past medical history of anxiety and depression schizoaffective disorder currently treated on lithium who comes in today after suffering a fall at her care facility. Patient states that she had a fall yesterday the facility however she did not have any complaints. Patient had a second fall today on her way to the smoking area again, additionally nursing facility staff state the patient's filled I will use on herself last night and made no effort to clean it up or acknowledge it. Patient has recent history of a UTI. Patient denies fevers chills nausea vomiting but does endorse some left upper extremity numbness. Patient denies chest pain or shortness of breath.  Physical exam patient in c-collar. Cranial nerves II through XII intact. Patient clear to auscultation abdomen soft nontender. Remainder physical exam within normal limits.  We'll check lithium level chest x-ray basic labs as well as urine and CT head  and C-spine.  Patient's workup largely within normal limits.  CTs showed no acute abnormalities.  Patient given appropriate follow-up and return precautions, patient voiced understanding.  Patient agreeable to discharge this time.   Final Clinical Impressions(s) / ED Diagnoses   Final diagnoses:  Fall, initial encounter    New Prescriptions Discharge Medication List as of 05/01/2016  5:48 PM       Chapman Moss, MD 05/04/16 [id number]    Sherwood Gambler, MD 05/10/16 [phone]

## 2016-05-01 NOTE — ED Notes (Signed)
Pt states that she felt weak which caused her to fall.

## 2016-05-03 ENCOUNTER — Inpatient Hospital Stay (HOSPITAL_COMMUNITY)
Admission: EM | Admit: 2016-05-03 | Discharge: 2016-05-06 | DRG: 689 | Disposition: A | Discharge status: Nursing Home (Includes ICF) | Payer: Medicare Other | Attending: Family Medicine | Admitting: Family Medicine

## 2016-05-03 ENCOUNTER — Emergency Department (HOSPITAL_COMMUNITY): Payer: Medicare Other

## 2016-05-03 ENCOUNTER — Encounter (HOSPITAL_COMMUNITY): Payer: Self-pay

## 2016-05-03 DIAGNOSIS — I119 Hypertensive heart disease without heart failure: Secondary | ICD-10-CM | POA: Diagnosis present

## 2016-05-03 DIAGNOSIS — R5383 Other fatigue: Secondary | ICD-10-CM | POA: Diagnosis not present

## 2016-05-03 DIAGNOSIS — Z823 Family history of stroke: Secondary | ICD-10-CM

## 2016-05-03 DIAGNOSIS — E876 Hypokalemia: Secondary | ICD-10-CM | POA: Diagnosis present

## 2016-05-03 DIAGNOSIS — I739 Peripheral vascular disease, unspecified: Secondary | ICD-10-CM | POA: Diagnosis present

## 2016-05-03 DIAGNOSIS — G9341 Metabolic encephalopathy: Secondary | ICD-10-CM | POA: Diagnosis present

## 2016-05-03 DIAGNOSIS — N390 Urinary tract infection, site not specified: Secondary | ICD-10-CM | POA: Diagnosis not present

## 2016-05-03 DIAGNOSIS — Z79899 Other long term (current) drug therapy: Secondary | ICD-10-CM

## 2016-05-03 DIAGNOSIS — F259 Schizoaffective disorder, unspecified: Secondary | ICD-10-CM | POA: Diagnosis present

## 2016-05-03 DIAGNOSIS — R2681 Unsteadiness on feet: Secondary | ICD-10-CM | POA: Diagnosis present

## 2016-05-03 DIAGNOSIS — F17210 Nicotine dependence, cigarettes, uncomplicated: Secondary | ICD-10-CM | POA: Diagnosis present

## 2016-05-03 DIAGNOSIS — Z8249 Family history of ischemic heart disease and other diseases of the circulatory system: Secondary | ICD-10-CM

## 2016-05-03 DIAGNOSIS — G9340 Encephalopathy, unspecified: Secondary | ICD-10-CM | POA: Diagnosis present

## 2016-05-03 DIAGNOSIS — F419 Anxiety disorder, unspecified: Secondary | ICD-10-CM | POA: Diagnosis present

## 2016-05-03 DIAGNOSIS — Z882 Allergy status to sulfonamides status: Secondary | ICD-10-CM

## 2016-05-03 DIAGNOSIS — I679 Cerebrovascular disease, unspecified: Secondary | ICD-10-CM | POA: Diagnosis present

## 2016-05-03 DIAGNOSIS — Z8673 Personal history of transient ischemic attack (TIA), and cerebral infarction without residual deficits: Secondary | ICD-10-CM

## 2016-05-03 DIAGNOSIS — E860 Dehydration: Secondary | ICD-10-CM

## 2016-05-03 DIAGNOSIS — Z9181 History of falling: Secondary | ICD-10-CM

## 2016-05-03 DIAGNOSIS — I452 Bifascicular block: Secondary | ICD-10-CM | POA: Diagnosis present

## 2016-05-03 DIAGNOSIS — R296 Repeated falls: Secondary | ICD-10-CM | POA: Diagnosis present

## 2016-05-03 DIAGNOSIS — F329 Major depressive disorder, single episode, unspecified: Secondary | ICD-10-CM | POA: Diagnosis present

## 2016-05-03 HISTORY — DX: Scoliosis, unspecified: M41.9

## 2016-05-03 HISTORY — DX: Essential (primary) hypertension: I10

## 2016-05-03 HISTORY — DX: Bifascicular block: I45.2

## 2016-05-03 LAB — BASIC METABOLIC PANEL
Anion gap: 8 (ref 5–15)
BUN: 11 mg/dL (ref 6–20)
CHLORIDE: 112 mmol/L — AB (ref 101–111)
CO2: 20 mmol/L — ABNORMAL LOW (ref 22–32)
CREATININE: 0.87 mg/dL (ref 0.44–1.00)
Calcium: 10.6 mg/dL — ABNORMAL HIGH (ref 8.9–10.3)
GFR calc Af Amer: >60 mL/min (ref >60)
GFR calc non Af Amer: >60 mL/min (ref >60)
GLUCOSE: 93 mg/dL (ref 65–99)
Potassium: 3.6 mmol/L (ref 3.5–5.1)
SODIUM: 140 mmol/L (ref 135–145)

## 2016-05-03 LAB — CBC
HEMATOCRIT: 37.2 % (ref 36.0–46.0)
Hemoglobin: 11.8 g/dL — ABNORMAL LOW (ref 12.0–15.0)
MCH: 29.4 pg (ref 26.0–34.0)
MCHC: 31.7 g/dL (ref 30.0–36.0)
MCV: 92.5 fL (ref 78.0–100.0)
PLATELETS: 262 10*3/uL (ref 150–400)
RBC: 4.02 MIL/uL (ref 3.87–5.11)
RDW: 13.1 % (ref 11.5–15.5)
WBC: 12.2 10*3/uL — AB (ref 4.0–10.5)

## 2016-05-03 LAB — URINALYSIS, ROUTINE W REFLEX MICROSCOPIC
BILIRUBIN URINE: NEGATIVE
GLUCOSE, UA: NEGATIVE mg/dL
KETONES UR: 15 mg/dL — AB
Nitrite: NEGATIVE
PH: 6.5 (ref 5.0–8.0)
PROTEIN: 30 mg/dL — AB
Specific Gravity, Urine: 1.018 (ref 1.005–1.030)

## 2016-05-03 LAB — URINE MICROSCOPIC-ADD ON: RBC / HPF: NONE SEEN RBC/hpf (ref 0–5)

## 2016-05-03 LAB — I-STAT CG4 LACTIC ACID, ED
LACTIC ACID, VENOUS: 0.68 mmol/L (ref 0.5–1.9)
LACTIC ACID, VENOUS: 0.64 mmol/L (ref 0.5–1.9)

## 2016-05-03 LAB — HEPATIC FUNCTION PANEL
ALK PHOS: 91 U/L (ref 38–126)
ALT: 25 U/L (ref 14–54)
AST: 24 U/L (ref 15–41)
Albumin: 4 g/dL (ref 3.5–5.0)
BILIRUBIN DIRECT: 0.1 mg/dL (ref 0.1–0.5)
BILIRUBIN INDIRECT: 0.7 mg/dL (ref 0.3–0.9)
BILIRUBIN TOTAL: 0.8 mg/dL (ref 0.3–1.2)
Total Protein: 6.8 g/dL (ref 6.5–8.1)

## 2016-05-03 LAB — URINE CULTURE

## 2016-05-03 LAB — CBG MONITORING, ED: Glucose-Capillary: 98 mg/dL (ref 65–99)

## 2016-05-03 LAB — LITHIUM LEVEL: LITHIUM LVL: 0.47 mmol/L — AB (ref 0.60–1.20)

## 2016-05-03 MED ORDER — SODIUM CHLORIDE 0.9 % IV SOLN
Freq: Once | INTRAVENOUS | Status: AC
  Administered 2016-05-03: 18:00:00 via INTRAVENOUS

## 2016-05-03 MED ORDER — ACETAMINOPHEN 325 MG PO TABS
650.0000 mg | ORAL_TABLET | Freq: Four times a day (QID) | ORAL | Status: DC | PRN
  Administered 2016-05-06: 650 mg via ORAL
  Filled 2016-05-03: qty 2

## 2016-05-03 MED ORDER — LISINOPRIL 5 MG PO TABS
5.0000 mg | ORAL_TABLET | Freq: Every day | ORAL | Status: DC
  Administered 2016-05-04 – 2016-05-06 (×3): 5 mg via ORAL
  Filled 2016-05-03 (×3): qty 1

## 2016-05-03 MED ORDER — SERTRALINE HCL 50 MG PO TABS
150.0000 mg | ORAL_TABLET | Freq: Every day | ORAL | Status: DC
  Administered 2016-05-03 – 2016-05-05 (×3): 150 mg via ORAL
  Filled 2016-05-03 (×3): qty 1

## 2016-05-03 MED ORDER — SODIUM CHLORIDE 0.9 % IV SOLN
INTRAVENOUS | Status: DC
  Administered 2016-05-03 – 2016-05-04 (×3): via INTRAVENOUS
  Administered 2016-05-05: 1000 mL via INTRAVENOUS
  Administered 2016-05-05: 11:00:00 via INTRAVENOUS

## 2016-05-03 MED ORDER — ASPIRIN EC 81 MG PO TBEC
81.0000 mg | DELAYED_RELEASE_TABLET | Freq: Every day | ORAL | Status: DC
  Administered 2016-05-03 – 2016-05-06 (×4): 81 mg via ORAL
  Filled 2016-05-03 (×4): qty 1

## 2016-05-03 MED ORDER — POLYETHYLENE GLYCOL 3350 17 G PO PACK
17.0000 g | PACK | Freq: Every day | ORAL | Status: DC
  Administered 2016-05-03 – 2016-05-06 (×4): 17 g via ORAL
  Filled 2016-05-03 (×4): qty 1

## 2016-05-03 MED ORDER — DEXTROSE 5 % IV SOLN
1.0000 g | INTRAVENOUS | Status: DC
  Administered 2016-05-04 – 2016-05-05 (×2): 1 g via INTRAVENOUS
  Filled 2016-05-03 (×3): qty 10

## 2016-05-03 MED ORDER — ENOXAPARIN SODIUM 40 MG/0.4ML ~~LOC~~ SOLN
40.0000 mg | SUBCUTANEOUS | Status: DC
  Administered 2016-05-03 – 2016-05-05 (×3): 40 mg via SUBCUTANEOUS
  Filled 2016-05-03 (×3): qty 0.4

## 2016-05-03 MED ORDER — METOPROLOL TARTRATE 12.5 MG HALF TABLET
12.5000 mg | ORAL_TABLET | Freq: Two times a day (BID) | ORAL | Status: DC
  Administered 2016-05-03 – 2016-05-05 (×5): 12.5 mg via ORAL
  Filled 2016-05-03 (×5): qty 1

## 2016-05-03 MED ORDER — SODIUM CHLORIDE 0.9 % IV BOLUS (SEPSIS)
1000.0000 mL | Freq: Once | INTRAVENOUS | Status: AC
  Administered 2016-05-03: 1000 mL via INTRAVENOUS

## 2016-05-03 MED ORDER — DEXTROSE 5 % IV SOLN
1.0000 g | Freq: Once | INTRAVENOUS | Status: AC
  Administered 2016-05-03: 1 g via INTRAVENOUS
  Filled 2016-05-03: qty 10

## 2016-05-03 MED ORDER — SODIUM CHLORIDE 0.9 % IV SOLN: INTRAVENOUS | Status: AC

## 2016-05-03 MED ORDER — RISPERIDONE 3 MG PO TABS
3.0000 mg | ORAL_TABLET | Freq: Two times a day (BID) | ORAL | Status: DC
  Administered 2016-05-03 – 2016-05-06 (×6): 3 mg via ORAL
  Filled 2016-05-03 (×6): qty 1

## 2016-05-03 NOTE — ED Notes (Signed)
Pt returned from CT °

## 2016-05-03 NOTE — ED Triage Notes (Signed)
Per [facility], Pt is coming from [facility] with continued weakness and altered Mental Status that started two days ago. Pt was seen here in the ED and discharged home. Pt had a decline in mental status three days ago with a fall that came afterwards. Pt was sent to the ED for fall. Facility reports the weakness and AMS has gotten worse since discharge. Pt's baseline is Alert and Oriented x4. Pt is in facility for schizoaffect. Pt ambulates and takes care of ADLs. Upon arrival to ED, Pt is drowsy, has a droop to the right face while sleeping, but can correct when moving face, complains of right arm pain, and will not follow all commands. Vitals per EMS: CBG 102, 26 RR, 95% on RA, 82 HR, 161/94. EMS placed a 18 Gauge IV L AC

## 2016-05-03 NOTE — ED Notes (Signed)
MD Bland Span at the bedside

## 2016-05-03 NOTE — H&P (Signed)
History and Physical:    Heidi Blevins   [id number] DOB: [birth year] DOA: 05/03/2016  Referring MD/provider: Duffy Bruce, MD PCP: [name], MD   Patient coming from: ALF  Chief Complaint: AMS, recurrent falls  History of Present Illness:   Heidi Blevins is an 65 y.o. female with a PMH of schizoaffective disorder who resides in an assisted living facility and who presented to the hospital for evaluation of weakness and multiple falls. Patient was recently evaluated in the ED on 05/01/16 after falling when she went out to go smoke on the patio. She also has had altered mental status and soiled herself at her facility without making any attempts to clean it up toward knowledge in which is unusual for her. A CT of her head and cervical spine were done during that visit and these were negative for acute findings. Urinalysis was fairly unremarkable and a culture ultimately grew multiple bacterial morphotypes. According to the patient's son, she has become increasingly confused and has a total of 3 falls over the past week with unsteadiness of the feet and generalized fatigue. She is normally talkative and interactive, but has become increasingly withdrawn. Her appetite is diminished and she has had little oral intake over the past several days. The patient's mentation precludes getting an exact history from her, but she does endorse abdominal pain and dysuria. She cannot tell me how long she has had the pain for or the quality of the pain. Her speech is difficult to understand.  ED Course:  The patient had a repeat CT scan of her head which was unremarkable. Chest x-ray showed mild right base subsegmental atelectasis and/or infiltrate. Lactic acid was not elevated. Urinalysis shows 6-30 WBC and a few bacteria. WBC is mildly elevated at 12.2. Chemistries show elevated calcium which has been elevated chronically.  ROS:   Review of Systems  Constitutional: Positive for  malaise/fatigue. Negative for chills and fever.  HENT: Negative.   Eyes: Negative.   Respiratory: Negative.   Cardiovascular: Negative.   Gastrointestinal: Positive for abdominal pain.  Genitourinary: Positive for dysuria.  Musculoskeletal: Positive for falls.  Neurological: Positive for weakness.  Endo/Heme/Allergies: Negative.   Psychiatric/Behavioral: Positive for depression.    Past Medical History:   Past Medical History:  Diagnosis Date  . Anxiety   . Arrhythmia   . Bifascicular block   . Depression   . Insomnia   . Schizoaffective disorder   . Scoliosis   . Vitamin D deficiency     Past Surgical History:   Past Surgical History:  Procedure Laterality Date  . BACK SURGERY      Social History:   Social History   Social History  . Marital status: Divorced    Spouse name: N/A  . Number of children: N/A  . Years of education: N/A   Occupational History  . Unemployed    Social History Main Topics  . Smoking status: Current Every Day Smoker    Packs/day: 0.50    Years: 10.00    Types: Cigarettes  . Smokeless tobacco: Never Used  . Alcohol use No  . Drug use: No  . Sexual activity: Not on file   Other Topics Concern  . Not on file   Social History Narrative   Lives in an ALF.  Normally independent of ADLs and with ambulation.    Allergies   Sulfonamide derivatives  Family history:   Family History  Problem Relation Age of Onset  . CVA  Mother   . Hypertension Mother   . Lupus Sister   . Hypertension Brother   . Peptic Ulcer Disease Father   . Colon cancer Neg Hx     Current Medications:   Prior to Admission medications   Medication Sig Start Date End Date Taking? Authorizing Provider  acetaminophen (TYLENOL) 325 MG tablet Take 650 mg by mouth every 6 (six) hours as needed. For fever/pain.   Yes Historical Provider, MD  guaifenesin (ROBITUSSIN) 100 MG/5ML syrup Take 200 mg by mouth every 6 (six) hours as needed for cough.   Yes  Historical Provider, MD  hydrOXYzine (ATARAX/VISTARIL) 50 MG tablet Take 100 mg by mouth 3 (three) times daily.   Yes Historical Provider, MD  lisinopril (PRINIVIL,ZESTRIL) 5 MG tablet Take 5 mg by mouth daily.   Yes Historical Provider, MD  metoprolol tartrate (LOPRESSOR) 25 MG tablet Take 12.5 mg by mouth 2 (two) times daily.   Yes Historical Provider, MD  risperiDONE (RISPERDAL) 3 MG tablet Take 3 mg by mouth 2 (two) times daily.   Yes Historical Provider, MD  sertraline (ZOLOFT) 100 MG tablet Take 150 mg by mouth at bedtime.    Yes Historical Provider, MD    Physical Exam:   Vitals:   05/03/16 1500 05/03/16 1515 05/03/16 1545 05/03/16 1730  BP: 159/83 140/83 167/85 166/84  Pulse: 87 84 80 85  Resp: 20 24 20 18   SpO2: 98% 97% 100% 100%  Weight:      Height:         Physical Exam: Blood pressure 166/84, pulse 85, resp. rate 18, height 5\' 5"  (1.651 m), weight 72.6 kg (160 lb), SpO2 100 %. Gen: No acute distress. Head: Normocephalic, atraumatic. Eyes: Pupils equal, round and reactive to light. Extraocular movements intact.  Sclerae nonicteric but injected bilaterally. No lid lag. Mouth: Oropharynx reveals extremely dry mucous membranes. Dentures to the upper palate. Neck: Supple, no thyromegaly, no lymphadenopathy, no jugular venous distention. Chest: Lungs are clear to auscultation with good air movement. No rales, rhonchi or wheezes.  CV: Heart sounds are regular with an S1, S2. No murmurs, rubs, clicks, or gallops.  Abdomen: Soft, mildly tender lower abdomen to deep palpation, nondistended with normal active bowel sounds. No hepatosplenomegaly or palpable masses. Extremities: Extremities are without clubbing, edema, or cyanosis. Pedal pulses 2+.  Skin: Warm and dry. No rashes, lesions or wounds. Neuro: Sleepy/disoriented; grossly nonfocal.  Psych: Insight is good and judgment is impaired. Mood and affect flat.   Data Review:    Labs: Basic Metabolic Panel:  Recent  Labs Lab 05/01/16 1640 05/03/16 1505  NA 138 140  K 3.8 3.6  CL 110 112*  CO2 22 20*  GLUCOSE 96 93  BUN 8 11  CREATININE 0.87 0.87  CALCIUM 10.7* 10.6*   Liver Function Tests:  Recent Labs Lab 05/01/16 1640  AST 23  ALT 26  ALKPHOS 88  BILITOT 0.6  PROT 6.6  ALBUMIN 4.1   CBC:  Recent Labs Lab 05/01/16 1640 05/03/16 1505  WBC 7.4 12.2*  NEUTROABS 4.9  --   HGB 12.0 11.8*  HCT 37.6 37.2  MCV 93.3 92.5  PLT 260 262   CBG:  Recent Labs Lab 05/03/16 1502  GLUCAP 98    Urinalysis    Component Value Date/Time   COLORURINE YELLOW 05/03/2016 1509   APPEARANCEUR TURBID (A) 05/03/2016 1509   LABSPEC 1.018 05/03/2016 1509   PHURINE 6.5 05/03/2016 1509   GLUCOSEU NEGATIVE 05/03/2016 1509   HGBUR  TRACE (A) 05/03/2016 1509   BILIRUBINUR NEGATIVE 05/03/2016 1509   KETONESUR 15 (A) 05/03/2016 1509   PROTEINUR 30 (A) 05/03/2016 1509   UROBILINOGEN 1.0 08/05/2011 1751   NITRITE NEGATIVE 05/03/2016 1509   LEUKOCYTESUR LARGE (A) 05/03/2016 1509      Radiographic Studies: Dg Chest 2 View  Result Date: 05/03/2016 CLINICAL DATA:  Weakness.  Altered mental status . EXAM: CHEST  2 VIEW COMPARISON:  05/01/2016.  08/05/2011 . FINDINGS: Heart size stable. Mild right base subsegmental atelectasis and or infiltrate. Rounded density projected over the right perihilar region most likely pulmonary vessel on end. Follow-up chest x-rays recommended to demonstrate resolution of these findings. No pleural effusion or pneumothorax. Thoracic spine scoliosis. Prior thoracolumbar spine spine fusion. IMPRESSION: 1. Mild right base subsegmental atelectasis and or infiltrate. Rounded density projected the right perihilar region, most likely a pulmonary vessel on end. Follow-up chest x-rays recommended to demonstrate resolution of these findings. 2. Thoracolumbar spine scoliosis with thoracolumbar spine fusion again noted . Electronically Signed   By: Marcello Moores  Register   On: 05/03/2016 16:18    Ct Head Wo Contrast  Result Date: 05/03/2016 CLINICAL DATA:  Altered mental status. Weakness starting 2 days ago. Fall. Right arm pain. EXAM: CT HEAD WITHOUT CONTRAST TECHNIQUE: Contiguous axial images were obtained from the base of the skull through the vertex without intravenous contrast. COMPARISON:  05/01/2016 FINDINGS: Brain: There is difficulty with head positioning resulting in asymmetry of slice position between right and left sides. Small stable remote lacunar infarct in the right lentiform nucleus, image 16/3. Periventricular white matter and corona radiata hypodensities favor chronic ischemic microvascular white matter disease. Otherwise, the brainstem, cerebellum, cerebral peduncles, thalami, basal ganglia, basilar cisterns, and ventricular system appear within normal limits. No intracranial hemorrhage, mass lesion, or acute CVA. Vascular: There is atherosclerotic calcification of the cavernous carotid arteries bilaterally. Skull: Unremarkable Sinuses/Orbits: Mild chronic ethmoid sinusitis. Mild chronic left frontal sinusitis. Other: Large plugs of cerumen (2.0 cm in length on the left and 1.6 cm in length on the right) appear to completely occlude the external auditory canals bilaterally, and likely interfere with auditory acuity. IMPRESSION: 1. No acute intracranial findings. 2. Periventricular white matter and corona radiata hypodensities favor chronic ischemic microvascular white matter disease. 3. Small remote lacunar infarct in the right lentiform nucleus. 4. Very large plugs of cerumen completely occlude the external auditory canals bilaterally, and likely interfere with auditory acuity. Electronically Signed   By: Van Clines M.D.   On: 05/03/2016 15:53    EKG: Independently reviewed. Sinus rhythm at 83 bpm. QTC 508 ms. Right bundle branch block and left anterior fascicular block.   Assessment/Plan:   Principal Problem:   Acute encephalopathy/Dehydration While its possible  that the patient's symptoms are from her UTI, cultures done 05/01/16 were polymicrobial and her urinalysis looks similar. Would hold sedating medications including hydroxyzine. Would hydrate as the patient is clinically very dehydrated. Would do a reversible causes of dementia evaluation including B-12, RPR, and TSH.  Active Problems:   UTI (urinary tract infection) We'll continue Rocephin for now and follow-up urine culture.    Hypercalcemia Appears to be chronic. We'll check an intact PTH.    Cerebrovascular disease CT scan shows microvascular small vessel disease and remote lacunar infarct. Will start on aspirin.    Schizoaffective disorder (HCC) Continue Risperdal and Zoloft.    Bifascicular block Chronic. No evidence of acute coronary syndrome.   Other information:   DVT prophylaxis: Lovenox ordered. Code Status: Full  code. Family Communication: No family at the bedside. Disposition Plan: From an assisted living facility with plans to return there. Consults called: None. Admission status: Observation.  The medical decision making on this patient was of high complexity and the patient is at high risk for clinical deterioration, therefore this is a level 3 visit.   Willette Mudry [facility] Pager [phone] Cell: [phone]   If 7PM-7AM, please contact night-coverage [email or web address] Password [email or web address] 05/03/2016, 5:51 PM

## 2016-05-03 NOTE — ED Provider Notes (Signed)
[facility] DEPT Provider Note   CSN: [id number] Arrival date & time: 05/03/16  1359     History   Chief Complaint Chief Complaint  Patient presents with  . Fatigue    HPI Heidi Blevins is a 65 y.o. female.  HPI 65 year old female past medical history of schizoaffective disorder who presents with altered mental status. Over the last 6 days, the patient's son states the patient is become increasingly confused and altered. She has had 3 falls over the last week due to unsteadiness on her feet and general fatigue. At baseline, the patient is normally very interactive and talkative over the last several days she's become increasingly withdrawn. She fell on 9/4 and was evaluated in the ED, at which time imaging was negative and she was sent home. Since then she has had little to no by mouth intake. She began to complain of suprapubic abdominal pain. Remainder of history limited due to patient's altered mental status  Past Medical History:  Diagnosis Date  . Anxiety   . Arrhythmia   . Bifascicular block   . Depression   . Hypertension   . Insomnia   . Schizoaffective disorder   . Scoliosis   . Vitamin D deficiency     Patient Active Problem List   Diagnosis Date Noted  . Complicated UTI (urinary tract infection) 05/03/2016  . Hypercalcemia 05/03/2016  . Dehydration 05/03/2016  . Acute encephalopathy 05/03/2016  . 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 Schizoaffective disorder ([place]) 05/03/2016  . Bifascicular block 05/03/2016    Past Surgical History:  Procedure Laterality Date  . BACK SURGERY     "as a child after she fell out of a tree"  . TONSILLECTOMY      OB History    No data available       Home Medications    Prior to Admission medications   Medication Sig Start Date End Date Taking? Authorizing Provider  acetaminophen (TYLENOL) 325 MG tablet Take 650 mg by mouth every 6 (six) hours as needed. For fever/pain.   Yes Historical Provider, MD    guaifenesin (ROBITUSSIN) 100 MG/5ML syrup Take 200 mg by mouth every 6 (six) hours as needed for cough.   Yes Historical Provider, MD  hydrOXYzine (ATARAX/VISTARIL) 50 MG tablet Take 100 mg by mouth 3 (three) times daily.   Yes Historical Provider, MD  lisinopril (PRINIVIL,ZESTRIL) 5 MG tablet Take 5 mg by mouth daily.   Yes Historical Provider, MD  metoprolol tartrate (LOPRESSOR) 25 MG tablet Take 12.5 mg by mouth 2 (two) times daily.   Yes Historical Provider, MD  risperiDONE (RISPERDAL) 3 MG tablet Take 3 mg by mouth 2 (two) times daily.   Yes Historical Provider, MD  sertraline (ZOLOFT) 100 MG tablet Take 150 mg by mouth at bedtime.    Yes Historical Provider, MD    Family History Family History  Problem Relation Age of Onset  . CVA Mother   . Hypertension Mother   . Lupus Sister   . Hypertension Brother   . Peptic Ulcer Disease Father   . Colon cancer Neg Hx     Social History Social History  Substance Use Topics  . Smoking status: Current Every Day Smoker    Packs/day: 0.25    Years: 40.00    Types: Cigarettes  . Smokeless tobacco: Never Used  . Alcohol use No     Allergies   Sulfonamide derivatives   Review of Systems Review of Systems  Unable to perform ROS: Mental  status change  Constitutional: Positive for fatigue. Negative for fever.  Gastrointestinal: Positive for abdominal pain and nausea. Negative for diarrhea and vomiting.  Musculoskeletal: Positive for gait problem.  All other systems reviewed and are negative.    Physical Exam Updated Vital Signs BP (!) 168/80 (BP Location: Right Arm)   Pulse 89   Temp 97.3 F (36.3 C) (Oral)   Resp 18   Ht 5\' 5"  (1.651 m)   Wt 160 lb (72.6 kg)   SpO2 100%   BMI 26.63 kg/m   Physical Exam  Constitutional: She is oriented to person, place, and time. She appears well-developed and well-nourished. No distress.  HENT:  Head: Normocephalic and atraumatic.  Eyes: Conjunctivae are normal.  Neck: Neck supple.   Cardiovascular: Normal rate, regular rhythm and normal heart sounds.  Exam reveals no friction rub.   No murmur heard. Pulmonary/Chest: Effort normal and breath sounds normal. No respiratory distress. She has no wheezes. She has no rales.  Abdominal: Soft. She exhibits no distension. There is tenderness (mild, suprapubic).  Musculoskeletal: She exhibits no edema.  Neurological: She is alert and oriented to person, place, and time. She exhibits normal muscle tone.  Skin: Skin is warm. Capillary refill takes less than 2 seconds.  Psychiatric: She has a normal mood and affect.  Nursing note and vitals reviewed.    ED Treatments / Results  Labs (all labs ordered are listed, but only abnormal results are displayed) Labs Reviewed  BASIC METABOLIC PANEL - Abnormal; Notable for the following:       Result Value   Chloride 112 (*)    CO2 20 (*)    Calcium 10.6 (*)    All other components within normal limits  CBC - Abnormal; Notable for the following:    WBC 12.2 (*)    Hemoglobin 11.8 (*)    All other components within normal limits  URINALYSIS, ROUTINE W REFLEX MICROSCOPIC (NOT AT [facility]) - Abnormal; Notable for the following:    APPearance TURBID (*)    Hgb urine dipstick TRACE (*)    Ketones, ur 15 (*)    Protein, ur 30 (*)    Leukocytes, UA LARGE (*)    All other components within normal limits  LITHIUM LEVEL - Abnormal; Notable for the following:    Lithium Lvl 0.47 (*)    All other components within normal limits  URINE MICROSCOPIC-ADD ON - Abnormal; Notable for the following:    Squamous Epithelial / LPF 0-5 (*)    Bacteria, UA FEW (*)    All other components within normal limits  URINE CULTURE  CULTURE, BLOOD (ROUTINE X 2)  CULTURE, BLOOD (ROUTINE X 2)  MRSA PCR SCREENING  HEPATIC FUNCTION PANEL  TSH  HEMOGLOBIN A1C  BASIC METABOLIC PANEL  VITAMIN B12  RPR  PTH, INTACT AND CALCIUM  CBG MONITORING, ED  I-STAT CG4 LACTIC ACID, ED  I-STAT CG4 LACTIC ACID, ED     EKG  EKG Interpretation  Date/Time:  Wednesday May 03 2016 14:03:24 EDT Ventricular Rate:  83 PR Interval:    QRS Duration: 154 QT Interval:  432 QTC Calculation: 508 R Axis:   -82 Text Interpretation:  Sinus rhythm RBBB and LAFB Similar to 05/01/2008 No significant change since last tracing Confirmed by Jaimen Melone MD, Kasyn Rolph [id number]) on 05/03/2016 3:32:10 PM       Radiology Dg Chest 2 View  Result Date: 05/03/2016 CLINICAL DATA:  Weakness.  Altered mental status . EXAM: CHEST  2 VIEW COMPARISON:  05/01/2016.  08/05/2011 . FINDINGS: Heart size stable. Mild right base subsegmental atelectasis and or infiltrate. Rounded density projected over the right perihilar region most likely pulmonary vessel on end. Follow-up chest x-rays recommended to demonstrate resolution of these findings. No pleural effusion or pneumothorax. Thoracic spine scoliosis. Prior thoracolumbar spine spine fusion. IMPRESSION: 1. Mild right base subsegmental atelectasis and or infiltrate. Rounded density projected the right perihilar region, most likely a pulmonary vessel on end. Follow-up chest x-rays recommended to demonstrate resolution of these findings. 2. Thoracolumbar spine scoliosis with thoracolumbar spine fusion again noted . Electronically Signed   By: Marcello Moores  Register   On: 05/03/2016 16:18   Ct Head Wo Contrast  Result Date: 05/03/2016 CLINICAL DATA:  Altered mental status. Weakness starting 2 days ago. Fall. Right arm pain. EXAM: CT HEAD WITHOUT CONTRAST TECHNIQUE: Contiguous axial images were obtained from the base of the skull through the vertex without intravenous contrast. COMPARISON:  05/01/2016 FINDINGS: Brain: There is difficulty with head positioning resulting in asymmetry of slice position between right and left sides. Small stable remote lacunar infarct in the right lentiform nucleus, image 16/3. Periventricular white matter and corona radiata hypodensities favor chronic ischemic microvascular white  matter disease. Otherwise, the brainstem, cerebellum, cerebral peduncles, thalami, basal ganglia, basilar cisterns, and ventricular system appear within normal limits. No intracranial hemorrhage, mass lesion, or acute CVA. Vascular: There is atherosclerotic calcification of the cavernous carotid arteries bilaterally. Skull: Unremarkable Sinuses/Orbits: Mild chronic ethmoid sinusitis. Mild chronic left frontal sinusitis. Other: Large plugs of cerumen (2.0 cm in length on the left and 1.6 cm in length on the right) appear to completely occlude the external auditory canals bilaterally, and likely interfere with auditory acuity. IMPRESSION: 1. No acute intracranial findings. 2. Periventricular white matter and corona radiata hypodensities favor chronic ischemic microvascular white matter disease. 3. Small remote lacunar infarct in the right lentiform nucleus. 4. Very large plugs of cerumen completely occlude the external auditory canals bilaterally, and likely interfere with auditory acuity. Electronically Signed   By: Van Clines M.D.   On: 05/03/2016 15:53    Procedures Procedures (including critical care time)  Medications Ordered in ED Medications  0.9 %  sodium chloride infusion ( Intravenous Not Given 05/03/16 1949)  lisinopril (PRINIVIL,ZESTRIL) tablet 5 mg (not administered)  metoprolol tartrate (LOPRESSOR) tablet 12.5 mg (12.5 mg Oral Given 05/03/16 2206)  risperiDONE (RISPERDAL) tablet 3 mg (3 mg Oral Given 05/03/16 2206)  acetaminophen (TYLENOL) tablet 650 mg (not administered)  sertraline (ZOLOFT) tablet 150 mg (150 mg Oral Given 05/03/16 2206)  enoxaparin (LOVENOX) injection 40 mg (40 mg Subcutaneous Given 05/03/16 2207)  0.9 %  sodium chloride infusion ( Intravenous New Bag/Given 05/03/16 1950)  polyethylene glycol (MIRALAX / GLYCOLAX) packet 17 g (17 g Oral Given 05/03/16 2206)  aspirin EC tablet 81 mg (81 mg Oral Given 05/03/16 2206)  cefTRIAXone (ROCEPHIN) 1 g in dextrose 5 % 50 mL IVPB (not  administered)  sodium chloride 0.9 % bolus 1,000 mL (0 mLs Intravenous Stopped 05/03/16 1630)  cefTRIAXone (ROCEPHIN) 1 g in dextrose 5 % 50 mL IVPB (0 g Intravenous Stopped 05/03/16 1840)  0.9 %  sodium chloride infusion ( Intravenous Transfusing/Transfer 05/03/16 1847)     Initial Impression / Assessment and Plan / ED Course  I have reviewed the triage vital signs and the nursing notes.  Pertinent labs & imaging results that were available during my care of the patient were reviewed by me and considered in my medical decision making (see  chart for details).  Clinical Course    65 yo F with PMHx of schizoaffective d/o who presents with increasing fatigue, confusion, and increased frequency of falls. Seen 9/4 with negative imaging. On arrival here, pt AOx3 but no focal neuro deficits. She has mildly slurred speech that improves with arousal. Initial DDx is broad, and includes occult infection (UTI, PNA), CVA, SDH, electrolyte abnormality (dehydration, uremia, etc), less likely ACS. EKG unremarkable. Will check labs, possibly admit. Will repeat CT Head. Last known normal >24 hours ago.  CT head shows NAICA. Labs reviewed as above. UA shows dehydration, +leukocytes c/w UTI. This is new from previous. BMP with mild dehyration. No signs of sepsis and LA normal. Will start Rocephin, continue IVF, admit to medicine.  Final Clinical Impressions(s) / ED Diagnoses   Final diagnoses:  UTI (lower urinary tract infection)  Dehydration    New Prescriptions Current Discharge Medication List       Duffy Bruce, MD 05/04/16 [phone]

## 2016-05-03 NOTE — Progress Notes (Signed)
Pt admitted to 5w14 from ED at shift change. Pt is A&Ox1. Son at bedside. Pt's skin is warm, dry and intact. Pt and son oriented to room. [name] brought depends from home and requesting pt to wear them. Pt placed on bedalarm. Will continue to monitor pt. Ranelle Oyster, RN

## 2016-05-04 ENCOUNTER — Observation Stay (HOSPITAL_COMMUNITY): Payer: Medicare Other

## 2016-05-04 DIAGNOSIS — Z79899 Other long term (current) drug therapy: Secondary | ICD-10-CM | POA: Diagnosis not present

## 2016-05-04 DIAGNOSIS — I739 Peripheral vascular disease, unspecified: Secondary | ICD-10-CM | POA: Diagnosis present

## 2016-05-04 DIAGNOSIS — E876 Hypokalemia: Secondary | ICD-10-CM | POA: Diagnosis present

## 2016-05-04 DIAGNOSIS — Z8673 Personal history of transient ischemic attack (TIA), and cerebral infarction without residual deficits: Secondary | ICD-10-CM | POA: Diagnosis not present

## 2016-05-04 DIAGNOSIS — Z882 Allergy status to sulfonamides status: Secondary | ICD-10-CM | POA: Diagnosis not present

## 2016-05-04 DIAGNOSIS — F251 Schizoaffective disorder, depressive type: Secondary | ICD-10-CM | POA: Diagnosis not present

## 2016-05-04 DIAGNOSIS — G9341 Metabolic encephalopathy: Secondary | ICD-10-CM | POA: Diagnosis present

## 2016-05-04 DIAGNOSIS — R5383 Other fatigue: Secondary | ICD-10-CM | POA: Diagnosis present

## 2016-05-04 DIAGNOSIS — I452 Bifascicular block: Secondary | ICD-10-CM | POA: Diagnosis present

## 2016-05-04 DIAGNOSIS — R2681 Unsteadiness on feet: Secondary | ICD-10-CM | POA: Diagnosis present

## 2016-05-04 DIAGNOSIS — G9340 Encephalopathy, unspecified: Secondary | ICD-10-CM | POA: Diagnosis not present

## 2016-05-04 DIAGNOSIS — F329 Major depressive disorder, single episode, unspecified: Secondary | ICD-10-CM | POA: Diagnosis present

## 2016-05-04 DIAGNOSIS — R296 Repeated falls: Secondary | ICD-10-CM | POA: Diagnosis present

## 2016-05-04 DIAGNOSIS — Z9181 History of falling: Secondary | ICD-10-CM | POA: Diagnosis not present

## 2016-05-04 DIAGNOSIS — E860 Dehydration: Secondary | ICD-10-CM | POA: Diagnosis present

## 2016-05-04 DIAGNOSIS — Z8249 Family history of ischemic heart disease and other diseases of the circulatory system: Secondary | ICD-10-CM | POA: Diagnosis not present

## 2016-05-04 DIAGNOSIS — N390 Urinary tract infection, site not specified: Secondary | ICD-10-CM | POA: Diagnosis present

## 2016-05-04 DIAGNOSIS — Z823 Family history of stroke: Secondary | ICD-10-CM | POA: Diagnosis not present

## 2016-05-04 DIAGNOSIS — I119 Hypertensive heart disease without heart failure: Secondary | ICD-10-CM | POA: Diagnosis present

## 2016-05-04 DIAGNOSIS — F17210 Nicotine dependence, cigarettes, uncomplicated: Secondary | ICD-10-CM | POA: Diagnosis present

## 2016-05-04 DIAGNOSIS — F419 Anxiety disorder, unspecified: Secondary | ICD-10-CM | POA: Diagnosis present

## 2016-05-04 DIAGNOSIS — F259 Schizoaffective disorder, unspecified: Secondary | ICD-10-CM | POA: Diagnosis present

## 2016-05-04 LAB — MRSA PCR SCREENING: MRSA BY PCR: NEGATIVE

## 2016-05-04 LAB — BASIC METABOLIC PANEL
ANION GAP: 10 (ref 5–15)
BUN: 11 mg/dL (ref 6–20)
CHLORIDE: 114 mmol/L — AB (ref 101–111)
CO2: 18 mmol/L — ABNORMAL LOW (ref 22–32)
Calcium: 10.2 mg/dL (ref 8.9–10.3)
Creatinine, Ser: 0.93 mg/dL (ref 0.44–1.00)
GFR calc Af Amer: >60 mL/min (ref >60)
GFR calc non Af Amer: >60 mL/min (ref >60)
GLUCOSE: 92 mg/dL (ref 65–99)
POTASSIUM: 3.5 mmol/L (ref 3.5–5.1)
Sodium: 142 mmol/L (ref 135–145)

## 2016-05-04 LAB — TSH: TSH: 1.753 u[IU]/mL (ref 0.350–4.500)

## 2016-05-04 LAB — VITAMIN B12: VITAMIN B 12: 761 pg/mL (ref 180–914)

## 2016-05-04 LAB — RPR: RPR Ser Ql: NONREACTIVE

## 2016-05-04 NOTE — Care Management Note (Signed)
Case Management Note  Patient Details  Name: Heidi Blevins MRN: [id number] Date of Birth: February 05, 1951  Subjective/Objective:                 Patient in obs from [facility] for [place].    Action/Plan:  CM and CSw for continue to follow for DC planning.   Expected Discharge Date:                  Expected Discharge Plan:  Assisted Living / Rest Home ([place])  In-House Referral:  Clinical Social Work  Discharge planning Services  CM Consult  Post Acute Care Choice:    Choice offered to:     DME Arranged:    DME Agency:     HH Arranged:    [place] Agency:     Status of Service:  In process, will continue to follow  If discussed at Long Length of Stay Meetings, dates discussed:    Additional Comments:  Carles Collet, RN 05/04/2016, 12:29 PM

## 2016-05-04 NOTE — Procedures (Signed)
EEG Report  Clinical History:  UTI, confusion, generalized weakness.  Technical Summary: A 19 channel digital EEG recording was performed using the 10-20 international system of electrode placement.  Bipolar and Referential montages were used.  The total recording time was about 20 minutes.  Description:  The patient is in stage 2 sleep for the majority of the study with the presence of theta frequency background, vertex sharp waves, and frontocentral sleep spindles.  No abnormalities during sleep.  When she does wake up during the latter part of the study, with eyes closed she has a posterior dominant rhythm of 9 Hz. Both hemispheres are symmetrical and there is no focal slowing present.  There are no epileptiform discharges or electrographic seizures present.   Impression:  This is a normal EEG in the awake and sleep states.   Rogue Jury, MS, MD

## 2016-05-04 NOTE — Progress Notes (Signed)
Patients son was in room, inquiring if patient was getting anything for anxiety, as she takes antianxiety medicine every day at [facility].  He thought she was having tremors, and actually, she was just cold.  Another blanket added did the trick.

## 2016-05-04 NOTE — Clinical Social Work Note (Signed)
Clinical Social Work Assessment  Patient Details  Name: Heidi Blevins MRN: [id number] Date of Birth: 13-Jan-1951  Date of referral:  05/04/16               Reason for consult:  Facility Placement                Permission sought to share information with:  Facility Sport and exercise psychologist, Family Supports Permission granted to share information::  No (Patient is disoriented; completed assessment w/ son at bedside)  Name::     Geneticist, molecular::  [place]  Relationship::  son  Contact Information:  [phone]  Housing/Transportation Living arrangements for the past 2 months:  [place] of Information:  Adult Children Patient Interpreter Needed:  None Criminal Activity/Legal Involvement Pertinent to Current Situation/Hospitalization:  No - Comment as needed Significant Relationships:  Adult Children Lives with:  Facility Resident Do you feel safe going back to the place where you live?  Yes Need for family participation in patient care:  Yes (Comment)  Care giving concerns:  CSW received referral regarding discharge planning. Patient is disoriented. CSW spoke with patient's son at bedside. He stated patient is from [facility] and will return there at discharge. CSW to continue to follow for discharge needs.    Social Worker assessment / plan:  Patient to return to ALF at discharge. Sons will take her by car.   Employment status:  Retired Forensic scientist:  Information systems manager, Medicaid In [place] PT Recommendations:  [place] / Referral to community resources:     Patient/Family's Response to care:  Patient's son expressed understanding of CSW role and discharge process.   Patient/Family's Understanding of and Emotional Response to Diagnosis, Current Treatment, and Prognosis:  No questions/concerns at this time, though patient's son hopes she gets better soon.  Emotional Assessment Appearance:  Appears stated  age Attitude/Demeanor/Rapport:  Unable to Assess Affect (typically observed):  Unable to Assess Orientation:  Oriented to Self Alcohol / Substance use:  Not Applicable Psych involvement (Current and /or in the community):  No (Comment)  Discharge Needs  Concerns to be addressed:  Care Coordination Readmission within the last 30 days:  No Current discharge risk:  None Barriers to Discharge:  Continued Medical Work up, Requiring sitter/restraints   [name], [place] 05/04/2016, 2:29 PM

## 2016-05-04 NOTE — Evaluation (Signed)
Physical Therapy Evaluation Patient Details Name: Heidi Blevins MRN: [id number] DOB: 1950-10-30 Today's Date: 05/04/2016   History of Present Illness  65 y.o. female admitted for acute encephalopathy, dehydration, and UTI. PMH significant for schizoaffective disorder, HTN, arrhythmia, insomnia, vitamin D deficiency, depression, and anxiety.   Clinical Impression  Patient presents with decreased mobility and AMS with deficits listed in PT problem list.  She will benefit from skilled PT in the acute setting to allow return to ALF with follow up HHPT.     Follow Up Recommendations Home health PT    Equipment Recommendations  None recommended by PT    Recommendations for Other Services       Precautions / Restrictions Precautions Precautions: Fall Restrictions Weight Bearing Restrictions: No      Mobility  Bed Mobility Overal bed mobility: Needs Assistance Bed Mobility: Supine to Sit Rolling: Mod assist   Supine to sit: Max assist     General bed mobility comments: assist for rolling and bringing legs off bed and lifting trunk upright  Transfers Overall transfer level: Needs assistance Equipment used: 2 person hand held assist Transfers: Sit to/from Stand Sit to Stand: Mod assist;+2 physical assistance         General transfer comment: assist to bring forward, pt accepting weight on her legs; stood x 2  Ambulation/Gait             General Gait Details: attempted to assist for stepping with lateral weight shift and pt started shuffling foot, but then legs buckled  Stairs            Wheelchair Mobility    Modified Rankin (Stroke Patients Only)       Balance Overall balance assessment: Needs assistance   Sitting balance-Leahy Scale: Poor Sitting balance - Comments: leaning back; at times able to sit with supervision, but jerky and at times falling back     Standing balance-Leahy Scale: Poor Standing balance comment: +2 A for standing  balance                             Pertinent Vitals/Pain Pain Assessment: No/denies pain    Home Living Family/patient expects to be discharged to:: Assisted living ([facility])                 Additional Comments: Unable to obtain information. Pt did not respond to any of therapist's questions and no family present.    Prior Function Level of Independence: Independent         Comments: Per chart, pt was independent with all ADLs and did not use AD for ambulation. Unsure if this is accurate as pt unable to provide information and no family present.     Hand Dominance        Extremity/Trunk Assessment   Upper Extremity Assessment: Difficult to assess due to impaired cognition           Lower Extremity Assessment: Difficult to assess due to impaired cognition         Communication   Communication: Other (comment) (mumbles to most questions, only clear answer second time I asked was her first name)  Cognition Arousal/Alertness: Lethargic Behavior During Therapy: Flat affect Overall Cognitive Status: Impaired/Different from baseline Area of Impairment: Orientation;Attention;Memory;Following commands;Safety/judgement;Awareness;Problem solving Orientation Level: Disoriented to;Place;Time;Situation Current Attention Level: Focused Memory: Decreased short-term memory Following Commands: Follows one step commands inconsistently Safety/Judgement: Decreased awareness of safety;Decreased awareness of deficits Awareness: Intellectual  Problem Solving: Slow processing;Decreased initiation;Requires tactile cues General Comments: lethargic, but rouses easily to voice, doesn't respond to many questions    General Comments      Exercises        Assessment/Plan    PT Assessment Patient needs continued PT services  PT Diagnosis Generalized weakness;Altered mental status   PT Problem List Decreased safety awareness;Decreased mobility;Decreased  activity tolerance;Decreased balance;Decreased knowledge of use of DME;Decreased cognition  PT Treatment Interventions DME instruction;Therapeutic activities;Therapeutic exercise;Gait training;Functional mobility training;Patient/family education   PT Goals (Current goals can be found in the Care Plan section) Acute Rehab PT Goals Patient Stated Goal: none stated PT Goal Formulation: Patient unable to participate in goal setting Time For Goal Achievement: 05/18/16 Potential to Achieve Goals: Fair    Frequency Min 2X/week   Barriers to discharge        Co-evaluation               End of Session Equipment Utilized During Treatment: Gait belt Activity Tolerance: Patient limited by fatigue Patient left: in bed;with call bell/phone within reach;with bed alarm set      Functional Assessment Tool Used: Clinical Judgement Functional Limitation: Mobility: Walking and moving around Mobility: Walking and Moving Around Current Status [phone]): At least 60 percent but less than 80 percent impaired, limited or restricted Mobility: Walking and Moving Around Goal Status [phone]): At least 40 percent but less than 60 percent impaired, limited or restricted    Time: 1350-1408 PT Time Calculation (min) (ACUTE ONLY): 18 min   Charges:   PT Evaluation $PT Eval Moderate Complexity: 1 Procedure     PT G Codes:   PT G-Codes **NOT FOR INPATIENT CLASS** Functional Assessment Tool Used: Clinical Judgement Functional Limitation: Mobility: Walking and moving around Mobility: Walking and Moving Around Current Status [id number]): At least 60 percent but less than 80 percent impaired, limited or restricted Mobility: Walking and Moving Around Goal Status [phone]): At least 40 percent but less than 60 percent impaired, limited or restricted    Reginia Naas 05/04/2016, 3:18 PM  Magda Kiel, [place] 05/04/2016

## 2016-05-04 NOTE — Evaluation (Signed)
Clinical/Bedside Swallow Evaluation Patient Details  Name: Heidi Blevins MRN: [id number] Date of Birth: 20-Jul-1951  Today's Date: 05/04/2016 Time: SLP Start Time (ACUTE ONLY): 1000 SLP Stop Time (ACUTE ONLY): 1015 SLP Time Calculation (min) (ACUTE ONLY): 15 min  Past Medical History:  Past Medical History:  Diagnosis Date  . Anxiety   . Arrhythmia   . Bifascicular block   . Depression   . Hypertension   . Insomnia   . Schizoaffective disorder   . Scoliosis   . Vitamin D deficiency    Past Surgical History:  Past Surgical History:  Procedure Laterality Date  . BACK SURGERY     "as a child after she fell out of a tree"  . TONSILLECTOMY     HPI:  Pt is admitted with acute encephalopathy and dehydration with UTI. Chest x-ray showed mild right base subsegmental atelectasis and/or infiltrate. Pt has a history of schizoaffective disorder, resides at ALF. Was recently admitted for UTI, but has no SLP in history.    Assessment / Plan / Recommendation Clinical Impression  Pt is drowsy and needs tactile and verbal cues to attend to PO intake. When attentitve, swallow is normal with no signs of aspiration, timely and strong. Pt did drift off x1 while trying to drink, with immediate hard cough. Would recommend pt consume dys 1 (puree) solids and thin liquids until mentation and arousal improve. There is no evidence of a significant dysphagia that would impact safety with PO when awake. Careful feeding and full supervision is needed. Will f/u for tolerance.     Aspiration Risk  Moderate aspiration risk    Diet Recommendation Dysphagia 1 (Puree);Thin liquid   Liquid Administration via: Cup;Straw Medication Administration: Whole meds with puree Supervision: Staff to assist with self feeding;Full supervision/cueing for compensatory strategies Compensations: Slow rate;Small sips/bites Postural Changes: Seated upright at 90 degrees    Other  Recommendations Oral Care Recommendations:  Oral care BID   Follow up Recommendations  None    Frequency and Duration min 2x/week  1 week       Prognosis Prognosis for Safe Diet Advancement: Good      Swallow Study   General HPI: Pt is admitted with acute encephalopathy and dehydration with UTI. Chest x-ray showed mild right base subsegmental atelectasis and/or infiltrate. Pt has a history of schizoaffective disorder, resides at ALF. Was recently admitted for UTI, but has no SLP in history.  Type of Study: Bedside Swallow Evaluation Previous Swallow Assessment: none Diet Prior to this Study: Thin liquids Temperature Spikes Noted: No Respiratory Status: Room air History of Recent Intubation: No Behavior/Cognition: Requires cueing;Distractible;Confused Oral Cavity Assessment: Dried secretions Oral Care Completed by SLP: Yes Oral Cavity - Dentition: Dentures, top;Edentulous Self-Feeding Abilities: Needs assist Patient Positioning: Upright in bed Baseline Vocal Quality: Normal Volitional Cough: Strong Volitional Swallow: Unable to elicit    Oral/Motor/Sensory Function Overall Oral Motor/Sensory Function: Within functional limits   Ice Chips Ice chips: Not tested   Thin Liquid Thin Liquid: Impaired Presentation: Cup;Straw Pharyngeal  Phase Impairments: Cough - Immediate    Nectar Thick Nectar Thick Liquid: Not tested   Honey Thick Honey Thick Liquid: Not tested   Puree Puree: Within functional limits   Solid   GO   Solid: Not tested       Herbie Baltimore, MA CCC-SLP [id number]  Diron Haddon, Katherene Ponto 05/04/2016,10:39 AM

## 2016-05-04 NOTE — Progress Notes (Signed)
EEG completed, results pending. 

## 2016-05-04 NOTE — Evaluation (Signed)
Occupational Therapy Evaluation Patient Details Name: Heidi Blevins MRN: [id number] DOB: 1951/08/13 Today's Date: 05/04/2016    History of Present Illness 65 y.o. female admitted for acute encephalopathy, dehydration, and UTI. PMH significant for schizoaffective disorder, HTN, arrhythmia, insomnia, vitamin D deficiency, depression, and anxiety.    Clinical Impression   PTA, pt was independent with ADLs and mobility per chart - pt unable to provide information due to lethargy and cognitive deficits. Pt required total assist for bed level ADLs and was unable to safely eat even with total assist provided. Pt lives at an ALF per chart and will require SNF for post-acute rehab at d/c. Pt will benefit from continued acute OT to increase independence and safety with ADLs and mobility to allow for safe discharge to the next venue of care. Will continue to follow acutely.    Follow Up Recommendations  SNF;Supervision/Assistance - 24 hour    Equipment Recommendations  Other (comment) (TBD at next venue of care)    Recommendations for Other Services       Precautions / Restrictions Precautions Precautions: Fall Restrictions Weight Bearing Restrictions: No      Mobility Bed Mobility Overal bed mobility: Needs Assistance;+2 for physical assistance Bed Mobility: Rolling Rolling: Total assist         General bed mobility comments: Due to pt's cognitive deficits and increased physical assistance required, did not mobilize pt further for safety.  Transfers                      Balance                                            ADL Overall ADL's : Needs assistance/impaired Eating/Feeding: Total assistance;Bed level   Grooming: Wash/dry face;Total assistance;Bed level                                 General ADL Comments: Pt unable to follow commands. Pt required total assist to wash face and hands despite multimodal cues, increased, time  and hand-over-hand assist. Attempted to assist pt eat breakfast, but pt very lethargic and would not chew jello or swallow small sips of liquids. Pt dozed off x4 while completing tasks with therapist.      Vision Additional Comments: Difficult to assess due to cognition   Perception     Praxis      Pertinent Vitals/Pain Pain Assessment: No/denies pain     Hand Dominance     Extremity/Trunk Assessment Upper Extremity Assessment Upper Extremity Assessment: Difficult to assess due to impaired cognition   Lower Extremity Assessment Lower Extremity Assessment: Difficult to assess due to impaired cognition       Communication     Cognition Arousal/Alertness: Lethargic Behavior During Therapy: Flat affect Overall Cognitive Status: Impaired/Different from baseline Area of Impairment: Orientation;Attention;Memory;Following commands;Safety/judgement;Awareness;Problem solving Orientation Level: Disoriented to;Place;Time;Situation Current Attention Level: Focused Memory: Decreased short-term memory Following Commands: Follows one step commands inconsistently Safety/Judgement: Decreased awareness of safety;Decreased awareness of deficits Awareness: Intellectual Problem Solving: Slow processing;Difficulty sequencing;Requires verbal cues;Requires tactile cues;Decreased initiation General Comments: When asked pt to state her name, she did not reply. When therapist said "Is your name Judson Roch" pt replied "No" and when therapist asked "Is your name Koral?" pt replied "Yes." Pt unable to respond to any other questions  and follow 10% of commands.   General Comments       Exercises       Shoulder Instructions      Home Living Family/patient expects to be discharged to:: Assisted living [facility])                                 Additional Comments: Unable to obtain information. Pt did not respond to any of therapist's questions and no family present.       Prior Functioning/Environment Level of Independence: Independent        Comments: Per chart, pt was independent with all ADLs and did not use AD for ambulation. Unsure if this is accurate as pt unable to provide information and no family present.    OT Diagnosis: Generalized weakness;Cognitive deficits;Altered mental status   OT Problem List: Decreased strength;Decreased range of motion;Decreased activity tolerance;Impaired balance (sitting and/or standing);Decreased coordination;Decreased cognition;Decreased safety awareness;Decreased knowledge of use of DME or AE   OT Treatment/Interventions: Self-care/ADL training;Therapeutic exercise;Neuromuscular education;DME and/or AE instruction;Therapeutic activities;Cognitive remediation/compensation;Patient/family education;Balance training    OT Goals(Current goals can be found in the care plan section) Acute Rehab OT Goals Patient Stated Goal: none stated OT Goal Formulation: Patient unable to participate in goal setting ADL Goals Pt Will Perform Grooming: with min assist;sitting Pt Will Perform Upper Body Bathing: with min assist;sitting Pt Will Perform Lower Body Bathing: with mod assist;sit to/from stand Pt Will Transfer to Toilet: with mod assist;ambulating;bedside commode (over toilet) Pt Will Perform Toileting - Clothing Manipulation and hygiene: with mod assist;sit to/from stand  OT Frequency: Min 2X/week   Barriers to D/C:            Co-evaluation              End of Session Nurse Communication: Mobility status  Activity Tolerance: Patient limited by lethargy Patient left: in bed;with call bell/phone within reach;with bed alarm set   Time: [id number] OT Time Calculation (min): 15 min Charges:  OT General Charges $OT Visit: 1 Procedure OT Evaluation $OT Eval High Complexity: 1 Procedure G-Codes: OT G-codes **NOT FOR INPATIENT CLASS** Functional Assessment Tool Used: clinical judgement Functional Limitation: Self  care Self Care Current Status [id number]): At least 80 percent but less than 100 percent impaired, limited or restricted Self Care Goal Status [id number]): At least 40 percent but less than 60 percent impaired, limited or restricted  Redmond Baseman, OTR/L Pager: [phone] 05/04/2016, 1:57 PM

## 2016-05-04 NOTE — Progress Notes (Signed)
PROGRESS NOTE    Heidi Blevins  [id number] DOB: [birth year] DOA: 05/03/2016 PCP: [name], MD   Brief Narrative:  Heidi Blevins is an 65 y.o. female with a PMH of schizoaffective disorder who resides in an assisted living facility and who presented to the hospital for evaluation of weakness and multiple falls. Admitted for UTI and acute encephalopathy. CT scan of her head showed no acute events. Chest x-ray showed mild right base subsegmental atelectasis and/or infiltrate. WBC is mildly elevated at 12.2. Chemistries show elevated calcium. Sedating meds on hold. Day 1 of hospital stay patient continues to be poorly responsive and difficult to understand, labs for reversible causes of metabolic encephalopathy so far negative. Neuro was consulted, recommended MRI and EEG.   Assessment & Plan:   Principal Problem:   Acute encephalopathy Active Problems:   Complicated UTI (urinary tract infection)   Hypercalcemia   Dehydration   Cerebrovascular disease   Schizoaffective disorder (HCC)   Bifascicular block     Acute encephalopathy/Dehydration Likely secondary to UTI/dehydration, labs for reversible causes of metabolic encephalopathy so far negative. Patient not at baseline, and poor appetite.  - Continue antibiotic therapy  - Continue IVF  - Neuro consult (verbal recommendation - Brain MRI and EEG) - Speech pathology - recommended  Thin liquid diet  - Will order ammonia - Continue to monitor w/ safety precautions   Active Problems:   UTI (urinary tract infection) - continue Rocephin for now and follow-up urine culture.  Hypercalcemia: Appears to be chronic. - Follow up intact PTH. - IV hydration   Cerebrovascular disease: CT scan shows microvascular small vessel disease and remote lacunar infarct. Likely to be chronic changes  -Continue aspirin.  Schizoaffective disorder (HCC) - Continue Risperdal and Zoloft.  Bifascicular block - Chronic. No evidence of  acute coronary syndrome.  DVT prophylaxis: Lovenox ordered. Code Status: Full code. Family Communication: No family at the bedside. Disposition Plan: From an assisted living facility with plans to return there.   Consultants:   Neurology   Procedures:   None    Antimicrobials:  Rocephin  (05/03/16) will continue for now, will switch to PO when patient more alert   Subjective:  Patient seen and examined at bedside. Difficult to understand patient. Patient alert but not oriented to place. Patient reports no pain.   Called son Kermit Mothershed, he reports that patient is not at baseline, which is normally independent with mild difficult on her speech. He notice declining in mental status 3 days ago. Also not eating or drinking well.  Objective: Vitals:   05/03/16 1906 05/03/16 1935 05/03/16 2147 05/04/16 0558  BP: (!) 171/88  (!) 168/80 (!) 179/75  Pulse: 93  89 79  Resp: (!) 2 20 18 18   Temp: 98.1 F (36.7 C)  97.3 F (36.3 C) 99 F (37.2 C)  TempSrc: Oral  Oral Oral  SpO2: 98%  100% 96%  Weight:      Height:        Intake/Output Summary (Last 24 hours) at 05/04/16 1457 Last data filed at 05/04/16 0612  Gross per 24 hour  Intake          1295.83 ml  Output              350 ml  Net           945.83 ml   Filed Weights   05/03/16 1412  Weight: 72.6 kg (160 lb)    Examination:  General exam: Appears  calm and comfortable  HEENT: PERRLA, OP dry mucosa. Respiratory system: Clear to auscultation. Respiratory effort normal. Cardiovascular system: S1 & S2 heard, RRR. No JVD, murmurs, rubs, gallops or clicks. Gastrointestinal system: Abdomen mild suprapubic tenderness, is nondistended, and soft. No organomegaly or masses felt. Normal bowel sounds heard. Central nervous system: Sleepy/disoriented to place. No focal deficits Extremities: Good ROM, No edema, Pedal pulses 2+.  Skin: No rashes, lesions or ulcers Psychiatry: Judgement and insight appear impaired. Mood &  affect flat      Data Reviewed: I have personally reviewed following labs and imaging studies  CBC:  Recent Labs Lab 05/01/16 1640 05/03/16 1505  WBC 7.4 12.2*  NEUTROABS 4.9  --   HGB 12.0 11.8*  HCT 37.6 37.2  MCV 93.3 92.5  PLT 260 [id number]   Basic Metabolic Panel:  Recent Labs Lab 05/01/16 1640 05/03/16 1505 05/04/16 0411  NA 138 140 142  K 3.8 3.6 3.5  CL 110 112* 114*  CO2 22 20* 18*  GLUCOSE 96 93 92  BUN 8 11 11   CREATININE 0.87 0.87 0.93  CALCIUM 10.7* 10.6* 10.2   GFR: Estimated Creatinine Clearance: 60.2 mL/min (by C-G formula based on SCr of 0.93 mg/dL). Liver Function Tests:  Recent Labs Lab 05/01/16 1640 05/03/16 1921  AST 23 24  ALT 26 25  ALKPHOS 88 91  BILITOT 0.6 0.8  PROT 6.6 6.8  ALBUMIN 4.1 4.0   No results for input(s): LIPASE, AMYLASE in the last 168 hours. No results for input(s): AMMONIA in the last 168 hours. Coagulation Profile: No results for input(s): INR, PROTIME in the last 168 hours. Cardiac Enzymes: No results for input(s): CKTOTAL, CKMB, CKMBINDEX, TROPONINI in the last 168 hours. BNP (last 3 results) No results for input(s): PROBNP in the last 8760 hours. HbA1C: No results for input(s): HGBA1C in the last 72 hours. CBG:  Recent Labs Lab 05/03/16 1502  GLUCAP 98   Lipid Profile: No results for input(s): CHOL, HDL, LDLCALC, TRIG, CHOLHDL, LDLDIRECT in the last 72 hours. Thyroid Function Tests:  Recent Labs  05/04/16 0411  TSH 1.753   Anemia Panel:  Recent Labs  05/04/16 0411  VITAMINB12 761   Sepsis Labs:  Recent Labs Lab 05/03/16 1520 05/03/16 1802  LATICACIDVEN 0.68 0.64    Recent Results (from the past 240 hour(s))  Urine culture     Status: Abnormal   Collection Time: 05/01/16  5:07 PM  Result Value Ref Range Status   Specimen Description URINE, CLEAN CATCH  Final   Special Requests NONE  Final   Culture MULTIPLE SPECIES PRESENT, SUGGEST RECOLLECTION (A)  Final   Report Status 05/03/2016  FINAL  Final  MRSA PCR Screening     Status: None   Collection Time: 05/03/16  9:18 PM  Result Value Ref Range Status   MRSA by PCR NEGATIVE NEGATIVE Final    Comment:        The GeneXpert MRSA Assay (FDA approved for NASAL specimens only), is one component of a comprehensive MRSA colonization surveillance program. It is not intended to diagnose MRSA infection nor to guide or monitor treatment for MRSA infections.          Radiology Studies: Dg Chest 2 View  Result Date: 05/03/2016 CLINICAL DATA:  Weakness.  Altered mental status . EXAM: CHEST  2 VIEW COMPARISON:  05/01/2016.  08/05/2011 . FINDINGS: Heart size stable. Mild right base subsegmental atelectasis and or infiltrate. Rounded density projected over the right perihilar region most likely  pulmonary vessel on end. Follow-up chest x-rays recommended to demonstrate resolution of these findings. No pleural effusion or pneumothorax. Thoracic spine scoliosis. Prior thoracolumbar spine spine fusion. IMPRESSION: 1. Mild right base subsegmental atelectasis and or infiltrate. Rounded density projected the right perihilar region, most likely a pulmonary vessel on end. Follow-up chest x-rays recommended to demonstrate resolution of these findings. 2. Thoracolumbar spine scoliosis with thoracolumbar spine fusion again noted . Electronically Signed   By: Marcello Moores  Register   On: 05/03/2016 16:18   Ct Head Wo Contrast  Result Date: 05/03/2016 CLINICAL DATA:  Altered mental status. Weakness starting 2 days ago. Fall. Right arm pain. EXAM: CT HEAD WITHOUT CONTRAST TECHNIQUE: Contiguous axial images were obtained from the base of the skull through the vertex without intravenous contrast. COMPARISON:  05/01/2016 FINDINGS: Brain: There is difficulty with head positioning resulting in asymmetry of slice position between right and left sides. Small stable remote lacunar infarct in the right lentiform nucleus, image 16/3. Periventricular white matter and  corona radiata hypodensities favor chronic ischemic microvascular white matter disease. Otherwise, the brainstem, cerebellum, cerebral peduncles, thalami, basal ganglia, basilar cisterns, and ventricular system appear within normal limits. No intracranial hemorrhage, mass lesion, or acute CVA. Vascular: There is atherosclerotic calcification of the cavernous carotid arteries bilaterally. Skull: Unremarkable Sinuses/Orbits: Mild chronic ethmoid sinusitis. Mild chronic left frontal sinusitis. Other: Large plugs of cerumen (2.0 cm in length on the left and 1.6 cm in length on the right) appear to completely occlude the external auditory canals bilaterally, and likely interfere with auditory acuity. IMPRESSION: 1. No acute intracranial findings. 2. Periventricular white matter and corona radiata hypodensities favor chronic ischemic microvascular white matter disease. 3. Small remote lacunar infarct in the right lentiform nucleus. 4. Very large plugs of cerumen completely occlude the external auditory canals bilaterally, and likely interfere with auditory acuity. Electronically Signed   By: Van Clines M.D.   On: 05/03/2016 15:53        Scheduled Meds: . sodium chloride   Intravenous STAT  . aspirin EC  81 mg Oral Daily  . cefTRIAXone (ROCEPHIN)  IV  1 g Intravenous Q24H  . enoxaparin (LOVENOX) injection  40 mg Subcutaneous Q24H  . lisinopril  5 mg Oral Daily  . metoprolol tartrate  12.5 mg Oral BID  . polyethylene glycol  17 g Oral Daily  . risperiDONE  3 mg Oral BID  . sertraline  150 mg Oral QHS   Continuous Infusions: . sodium chloride 125 mL/hr at 05/04/16 0445     LOS: 0 days    Time spent: 60 minutes    Doreatha Lew, MD [facility] Pager [phone]  If 7PM-7AM, please contact night-coverage [email or web address] Password [email or web address] 05/04/2016, 2:57 PM

## 2016-05-05 DIAGNOSIS — G9340 Encephalopathy, unspecified: Secondary | ICD-10-CM

## 2016-05-05 DIAGNOSIS — F251 Schizoaffective disorder, depressive type: Secondary | ICD-10-CM

## 2016-05-05 LAB — HEMOGLOBIN A1C
Hgb A1c MFr Bld: 5.1 % (ref 4.8–5.6)
Mean Plasma Glucose: 100 mg/dL

## 2016-05-05 LAB — BASIC METABOLIC PANEL
ANION GAP: 7 (ref 5–15)
BUN: 9 mg/dL (ref 6–20)
CO2: 18 mmol/L — ABNORMAL LOW (ref 22–32)
Calcium: 9.6 mg/dL (ref 8.9–10.3)
Chloride: 116 mmol/L — ABNORMAL HIGH (ref 101–111)
Creatinine, Ser: 0.81 mg/dL (ref 0.44–1.00)
GFR calc Af Amer: >60 mL/min (ref >60)
Glucose, Bld: 107 mg/dL — ABNORMAL HIGH (ref 65–99)
POTASSIUM: 3.4 mmol/L — AB (ref 3.5–5.1)
SODIUM: 141 mmol/L (ref 135–145)

## 2016-05-05 LAB — CBC
HEMATOCRIT: 32.1 % — AB (ref 36.0–46.0)
HEMOGLOBIN: 10.4 g/dL — AB (ref 12.0–15.0)
MCH: 29.5 pg (ref 26.0–34.0)
MCHC: 32.4 g/dL (ref 30.0–36.0)
MCV: 91.2 fL (ref 78.0–100.0)
PLATELETS: 219 10*3/uL (ref 150–400)
RBC: 3.52 MIL/uL — AB (ref 3.87–5.11)
RDW: 13.6 % (ref 11.5–15.5)
WBC: 10.1 10*3/uL (ref 4.0–10.5)

## 2016-05-05 LAB — PTH, INTACT AND CALCIUM
Calcium, Total (PTH): 10 mg/dL (ref 8.7–10.3)
PTH: 63 pg/mL (ref 15–65)

## 2016-05-05 LAB — MAGNESIUM: MAGNESIUM: 1.7 mg/dL (ref 1.7–2.4)

## 2016-05-05 LAB — AMMONIA: AMMONIA: 20 umol/L (ref 9–35)

## 2016-05-05 LAB — PHOSPHORUS: PHOSPHORUS: 2.2 mg/dL — AB (ref 2.5–4.6)

## 2016-05-05 MED ORDER — POTASSIUM CHLORIDE CRYS ER 20 MEQ PO TBCR
40.0000 meq | EXTENDED_RELEASE_TABLET | Freq: Once | ORAL | Status: AC
  Administered 2016-05-05: 40 meq via ORAL
  Filled 2016-05-05: qty 2

## 2016-05-05 MED ORDER — SODIUM CHLORIDE 0.45 % IV SOLN
INTRAVENOUS | Status: DC
  Administered 2016-05-05: 14:00:00 via INTRAVENOUS
  Administered 2016-05-06: 1000 mL via INTRAVENOUS

## 2016-05-05 NOTE — Consult Note (Signed)
Admission H&P    Chief Complaint: Altered mental status.  HPI: Heidi Blevins is an 65 y.o. female history of schizoaffective disorder, hypertension, depression, cardiac dysrhythmia and anxiety, admitted on 05/03/2016 for altered mental status. She was found to have likely urinary tract infection as well as dehydration. She also has chronic hypercalcemia. CT scan of her head showed no acute intracranial abnormality. MRI on 05/04/2016 also showed no acute changes. EEG on 05/04/2016 showed normal findings during wakefulness and during sleep. Vitamin B 12 and TSH were normal. RPR was nonreactive. Patient's son thinks that her mental status has improved since admission, although not back to baseline.  Past Medical History:  Diagnosis Date  . Anxiety   . Arrhythmia   . Bifascicular block   . Depression   . Hypertension   . Insomnia   . Schizoaffective disorder   . Scoliosis   . Vitamin D deficiency     Past Surgical History:  Procedure Laterality Date  . BACK SURGERY     "as a child after she fell out of a tree"  . TONSILLECTOMY      Family History  Problem Relation Age of Onset  . CVA Mother   . Hypertension Mother   . Lupus Sister   . Hypertension Brother   . Peptic Ulcer Disease Father   . Colon cancer Neg Hx    Social History:  reports that she has been smoking Cigarettes.  She has a 10.00 pack-year smoking history. She has never used smokeless tobacco. She reports that she does not drink alcohol or use drugs.  Allergies:  Allergies  Allergen Reactions  . Sulfonamide Derivatives     Facility-Administered Medications Prior to Admission  Medication Dose Route Frequency Provider Last Rate Last Dose  . 0.9 %  sodium chloride infusion  500 mL Intravenous Continuous Gatha Mayer, MD       Medications Prior to Admission  Medication Sig Dispense Refill  . acetaminophen (TYLENOL) 325 MG tablet Take 650 mg by mouth every 6 (six) hours as needed. For fever/pain.    Marland Kitchen  guaifenesin (ROBITUSSIN) 100 MG/5ML syrup Take 200 mg by mouth every 6 (six) hours as needed for cough.    . hydrOXYzine (ATARAX/VISTARIL) 50 MG tablet Take 100 mg by mouth 3 (three) times daily.    Marland Kitchen lisinopril (PRINIVIL,ZESTRIL) 5 MG tablet Take 5 mg by mouth daily.    . metoprolol tartrate (LOPRESSOR) 25 MG tablet Take 12.5 mg by mouth 2 (two) times daily.    . risperiDONE (RISPERDAL) 3 MG tablet Take 3 mg by mouth 2 (two) times daily.    . sertraline (ZOLOFT) 100 MG tablet Take 150 mg by mouth at bedtime.       ROS: Unavailable due to patient's confusional state.  Physical Examination: Blood pressure (!) 169/88, pulse 90, temperature 98.9 F (37.2 C), temperature source Oral, resp. rate 20, height _0  (1.651 m), weight 72.6 kg (160 lb), SpO2 98 %.  HEENT-  Normocephalic, no lesions, without obvious abnormality.  Normal external eye and conjunctiva.  Normal TM's bilaterally.  Normal auditory canals and external ears. Normal external nose, mucus membranes and septum.  Normal pharynx. Neck supple with no masses, nodes, nodules or enlargement. Cardiovascular - regular rate and rhythm, S1, S2 normal, no murmur, click, rub or gallop Lungs - chest clear, no wheezing, rales, normal symmetric air entry Abdomen - soft, non-tender; bowel sounds normal; no masses,  no organomegaly Extremities - no joint deformities, effusion, or inflammation  Neurologic Examination: Mental Status: Somewhat drowsy, disoriented to time including current age, no acute distress.  Speech fluent without evidence of aphasia. Able to follow commands without difficulty. Cranial Nerves: II-Visual fields were normal. III/IV/VI-Pupils were equal and reacted normally to light. Extraocular movements were full and conjugate.    V/VII-no facial numbness and no facial weakness. VIII-normal. X-normal speech. XI: trapezius strength/neck flexion strength normal bilaterally XII-midline tongue extension with normal  strength. Motor: 5/5 bilaterally with normal tone and bulk Sensory: Normal throughout. Deep Tendon Reflexes: 2+ and symmetric. Plantars: Mute bilaterally Cerebellar: Normal coordination of upper extremities. Carotid auscultation: Normal  Results for orders placed or performed during the hospital encounter of 05/03/16 (from the past 48 hour(s))  CBG monitoring, ED     Status: None   Collection Time: 05/03/16  3:02 PM  Result Value Ref Range   Glucose-Capillary 98 65 - 99 mg/dL  Basic metabolic panel     Status: Abnormal   Collection Time: 05/03/16  3:05 PM  Result Value Ref Range   Sodium 140 135 - 145 mmol/L   Potassium 3.6 3.5 - 5.1 mmol/L   Chloride 112 (H) 101 - 111 mmol/L   CO2 20 (L) 22 - 32 mmol/L   Glucose, Bld 93 65 - 99 mg/dL   BUN 11 6 - 20 mg/dL   Creatinine, Ser 0.87 0.44 - 1.00 mg/dL   Calcium 10.6 (H) 8.9 - 10.3 mg/dL   GFR calc non Af Amer >60 >60 mL/min   GFR calc Af Amer >60 >60 mL/min    Comment: (NOTE) The eGFR has been calculated using the CKD EPI equation. This calculation has not been validated in all clinical situations. eGFR's persistently <60 mL/min signify possible Chronic Kidney Disease.    Anion gap 8 5 - 15  CBC     Status: Abnormal   Collection Time: 05/03/16  3:05 PM  Result Value Ref Range   WBC 12.2 (H) 4.0 - 10.5 K/uL   RBC 4.02 3.87 - 5.11 MIL/uL   Hemoglobin 11.8 (L) 12.0 - 15.0 g/dL   HCT 37.2 36.0 - 46.0 %   MCV 92.5 78.0 - 100.0 fL   MCH 29.4 26.0 - 34.0 pg   MCHC 31.7 30.0 - 36.0 g/dL   RDW 13.1 11.5 - 15.5 %   Platelets 262 150 - 400 K/uL  Urinalysis, Routine w reflex microscopic     Status: Abnormal   Collection Time: 05/03/16  3:09 PM  Result Value Ref Range   Color, Urine YELLOW YELLOW   APPearance TURBID (A) CLEAR   Specific Gravity, Urine 1.018 1.005 - 1.030   pH 6.5 5.0 - 8.0   Glucose, UA NEGATIVE NEGATIVE mg/dL   Hgb urine dipstick TRACE (A) NEGATIVE   Bilirubin Urine NEGATIVE NEGATIVE   Ketones, ur 15 (A)  NEGATIVE mg/dL   Protein, ur 30 (A) NEGATIVE mg/dL   Nitrite NEGATIVE NEGATIVE   Leukocytes, UA LARGE (A) NEGATIVE  Urine microscopic-add on     Status: Abnormal   Collection Time: 05/03/16  3:09 PM  Result Value Ref Range   Squamous Epithelial / LPF 0-5 (A) NONE SEEN   WBC, UA 6-30 0 - 5 WBC/hpf   RBC / HPF NONE SEEN 0 - 5 RBC/hpf   Bacteria, UA FEW (A) NONE SEEN   Urine-Other AMORPHOUS URATES/PHOSPHATES   I-Stat CG4 Lactic Acid, ED     Status: None   Collection Time: 05/03/16  3:20 PM  Result Value Ref Range   Lactic  Acid, Venous 0.68 0.5 - 1.9 mmol/L  Blood culture (routine x 2)     Status: None (Preliminary result)   Collection Time: 05/03/16  5:03 PM  Result Value Ref Range   Specimen Description BLOOD RIGHT ARM    Special Requests BOTTLES DRAWN AEROBIC AND ANAEROBIC 5CC    Culture NO GROWTH < 24 HOURS    Report Status PENDING   Blood culture (routine x 2)     Status: None (Preliminary result)   Collection Time: 05/03/16  5:05 PM  Result Value Ref Range   Specimen Description BLOOD LEFT HAND    Special Requests IN PEDIATRIC BOTTLE 4CC    Culture NO GROWTH < 24 HOURS    Report Status PENDING   I-Stat CG4 Lactic Acid, ED     Status: None   Collection Time: 05/03/16  6:02 PM  Result Value Ref Range   Lactic Acid, Venous 0.64 0.5 - 1.9 mmol/L  Hepatic function panel     Status: None   Collection Time: 05/03/16  7:21 PM  Result Value Ref Range   Total Protein 6.8 6.5 - 8.1 g/dL   Albumin 4.0 3.5 - 5.0 g/dL   AST 24 15 - 41 U/L   ALT 25 14 - 54 U/L   Alkaline Phosphatase 91 38 - 126 U/L   Total Bilirubin 0.8 0.3 - 1.2 mg/dL   Bilirubin, Direct 0.1 0.1 - 0.5 mg/dL   Indirect Bilirubin 0.7 0.3 - 0.9 mg/dL  Lithium level     Status: Abnormal   Collection Time: 05/03/16  7:21 PM  Result Value Ref Range   Lithium Lvl 0.47 (L) 0.60 - 1.20 mmol/L  MRSA PCR Screening     Status: None   Collection Time: 05/03/16  9:18 PM  Result Value Ref Range   MRSA by PCR NEGATIVE  NEGATIVE    Comment:        The GeneXpert MRSA Assay (FDA approved for NASAL specimens only), is one component of a comprehensive MRSA colonization surveillance program. It is not intended to diagnose MRSA infection nor to guide or monitor treatment for MRSA infections.   TSH     Status: None   Collection Time: 05/04/16  4:11 AM  Result Value Ref Range   TSH 1.753 0.350 - 4.500 uIU/mL  Hemoglobin A1c     Status: None   Collection Time: 05/04/16  4:11 AM  Result Value Ref Range   Hgb A1c MFr Bld 5.1 4.8 - 5.6 %    Comment: (NOTE)         Pre-diabetes: 5.7 - 6.4         Diabetes: >6.4         Glycemic control for adults with diabetes: <7.0    Mean Plasma Glucose 100 mg/dL    Comment: (NOTE) Performed At: [facility] [place], [place] [id number] Lindon Romp MD JM:[id number]   Basic metabolic panel     Status: Abnormal   Collection Time: 05/04/16  4:11 AM  Result Value Ref Range   Sodium 142 135 - 145 mmol/L   Potassium 3.5 3.5 - 5.1 mmol/L   Chloride 114 (H) 101 - 111 mmol/L   CO2 18 (L) 22 - 32 mmol/L   Glucose, Bld 92 65 - 99 mg/dL   BUN 11 6 - 20 mg/dL   Creatinine, Ser 0.93 0.44 - 1.00 mg/dL   Calcium 10.2 8.9 - 10.3 mg/dL   GFR calc non Af Amer >60 >60  mL/min   GFR calc Af Amer >60 >60 mL/min    Comment: (NOTE) The eGFR has been calculated using the CKD EPI equation. This calculation has not been validated in all clinical situations. eGFR's persistently <60 mL/min signify possible Chronic Kidney Disease.    Anion gap 10 5 - 15  Vitamin B12     Status: None   Collection Time: 05/04/16  4:11 AM  Result Value Ref Range   Vitamin B-12 761 180 - 914 pg/mL    Comment: (NOTE) This assay is not validated for testing neonatal or myeloproliferative syndrome specimens for Vitamin B12 levels.   RPR     Status: None   Collection Time: 05/04/16  4:11 AM  Result Value Ref Range   RPR Ser Ql Non Reactive Non Reactive    Comment:  (NOTE) Performed At: [facility] [place], [place] [id number] Lindon Romp MD OE:[id number]    Dg Chest 2 View  Result Date: 05/03/2016 CLINICAL DATA:  Weakness.  Altered mental status . EXAM: CHEST  2 VIEW COMPARISON:  05/01/2016.  08/05/2011 . FINDINGS: Heart size stable. Mild right base subsegmental atelectasis and or infiltrate. Rounded density projected over the right perihilar region most likely pulmonary vessel on end. Follow-up chest x-rays recommended to demonstrate resolution of these findings. No pleural effusion or pneumothorax. Thoracic spine scoliosis. Prior thoracolumbar spine spine fusion. IMPRESSION: 1. Mild right base subsegmental atelectasis and or infiltrate. Rounded density projected the right perihilar region, most likely a pulmonary vessel on end. Follow-up chest x-rays recommended to demonstrate resolution of these findings. 2. Thoracolumbar spine scoliosis with thoracolumbar spine fusion again noted . Electronically Signed   By: Marcello Moores  Register   On: 05/03/2016 16:18   Ct Head Wo Contrast  Result Date: 05/03/2016 CLINICAL DATA:  Altered mental status. Weakness starting 2 days ago. Fall. Right arm pain. EXAM: CT HEAD WITHOUT CONTRAST TECHNIQUE: Contiguous axial images were obtained from the base of the skull through the vertex without intravenous contrast. COMPARISON:  05/01/2016 FINDINGS: Brain: There is difficulty with head positioning resulting in asymmetry of slice position between right and left sides. Small stable remote lacunar infarct in the right lentiform nucleus, image 16/3. Periventricular white matter and corona radiata hypodensities favor chronic ischemic microvascular white matter disease. Otherwise, the brainstem, cerebellum, cerebral peduncles, thalami, basal ganglia, basilar cisterns, and ventricular system appear within normal limits. No intracranial hemorrhage, mass lesion, or acute CVA. Vascular: There is atherosclerotic calcification  of the cavernous carotid arteries bilaterally. Skull: Unremarkable Sinuses/Orbits: Mild chronic ethmoid sinusitis. Mild chronic left frontal sinusitis. Other: Large plugs of cerumen (2.0 cm in length on the left and 1.6 cm in length on the right) appear to completely occlude the external auditory canals bilaterally, and likely interfere with auditory acuity. IMPRESSION: 1. No acute intracranial findings. 2. Periventricular white matter and corona radiata hypodensities favor chronic ischemic microvascular white matter disease. 3. Small remote lacunar infarct in the right lentiform nucleus. 4. Very large plugs of cerumen completely occlude the external auditory canals bilaterally, and likely interfere with auditory acuity. Electronically Signed   By: Van Clines M.D.   On: 05/03/2016 15:53   Mr Brain Wo Contrast  Result Date: 05/04/2016 CLINICAL DATA:  Altered mental status. EXAM: MRI HEAD WITHOUT CONTRAST TECHNIQUE: Multiplanar, multiecho pulse sequences of the brain and surrounding structures were obtained without intravenous contrast. COMPARISON:  Head CT 05/03/2016, brain MRI 09/20/2007 FINDINGS: Despite efforts by the technologist and patient, motion artifact is present on today's  examination and could not be eliminated. This reduces the sensitivity and specificity of the study. The examination could not be completed due to the patient's altered mental status and inability to cooperate with the technologist instructions. Diffusion weighted imaging, axial gradient echo and axial T2 weighted sequences were obtained. Brain: No acute infarct or intraparenchymal hemorrhage. There is multifocal periventricular and subcortical hyperintense T2 weighted signal compatible with chronic microvascular disease. No midline shift. No hydrocephalus. Vascular: No evidence of chronic microhemorrhage or amyloid angiopathy. Sinuses/Orbits: No fluid levels or advanced mucosal thickening. No mastoid effusion. IMPRESSION: 1.  Truncated examination, as above. 2. No acute infarct, midline shift or evidence of acute hemorrhage. Electronically Signed   By: Ulyses Jarred M.D.   On: 05/04/2016 22:11    Assessment/Plan 65 year old lady admitted with altered mental status with encephalopathic state most likely secondary to multiple factors, including urinary tract infection and dehydration, as well as baseline psychiatric disorder. Mild early dementia cannot be ruled out.  Recommend no changes in current management. No further neurodiagnostic studies are indicated at this point.  We will continue to follow this patient with you, for now.  C.R. Nicole Kindred, MD [facility] [phone]  05/05/2016, 4:38 AM

## 2016-05-05 NOTE — Progress Notes (Signed)
Speech Language Pathology Treatment: Dysphagia  Patient Details Name: Heidi Blevins MRN: [id number] DOB: 04-07-1951 Today's Date: 05/05/2016 Time: 0900-1020 SLP Time Calculation (min) (ACUTE ONLY): 80 min  Assessment / Plan / Recommendation Clinical Impression  Pt demonstrated improved sustained arousal, but still with generalized weakness requiring assistance with self feeding. SLP positioned pt at edge of bed with min assist for trunk support. And mod assist to grasp cups for self feeding. Pt tolerated thin liquids well with one cough when swallow appeared dis coordinated. Will upgrade texture to dys 2 (fine chop) for energy conservation.    HPI HPI: Pt is admitted with acute encephalopathy and dehydration with UTI. Chest x-ray showed mild right base subsegmental atelectasis and/or infiltrate. Pt has a history of schizoaffective disorder, resides at ALF. Was recently admitted for UTI, but has no SLP in history.       SLP Plan  Continue with current plan of care     Recommendations  Diet recommendations: Dysphagia 2 (fine chop);Thin liquid Medication Administration: Whole meds with puree Supervision: Staff to assist with self feeding Compensations: Slow rate;Small sips/bites Postural Changes and/or Swallow Maneuvers: Seated upright 90 degrees             Plan: Continue with current plan of care     GO               [facility], MA CCC-SLP [id number]  Heidi Blevins 05/05/2016, 10:16 AM

## 2016-05-05 NOTE — Progress Notes (Signed)
PROGRESS NOTE    Heidi Blevins  [id number] DOB: Apr 17, 1951 DOA: 05/03/2016 PCP: [name], MD   Brief Narrative:  Heidi Blevins is an 65 y.o. female with a PMH of schizoaffective disorder who resides in an assisted living facility and who presented to the hospital for evaluation of weakness and multiple falls. Admitted for UTI and acute encephalopathy. CT scan of her head showed no acute events. Chest x-ray showed mild right base subsegmental atelectasis and/or infiltrate. WBC is mildly elevated at 12.2. Chemistries show elevated calcium. Sedating meds on hold, labs for reversible causes of metabolic encephalopathy so far negative. EEG and MRI negative. Neuro evaluation recommends continue current management. Today patient more alert although continue to be disoriented to place. White count trending down, urine cultures pending, blood cultures no growth up to date. PT recommended home therapy.   Assessment & Plan:   Principal Problem:   Acute encephalopathy Active Problems:   Complicated UTI (urinary tract infection)   Hypercalcemia   Dehydration   Cerebrovascular disease   Schizoaffective disorder (HCC)   Bifascicular block     Acute encephalopathy/Dehydration - patient improving slowly Likely secondary to UTI/dehydration, labs for reversible causes of metabolic encephalopathy so far negative. MRI negative for acute events EEG normal. Ammonia levels normal - Continue antibiotic therapy  - We'll decrease IV fluid due to elevated blood pressures - Neuro recommendations seen and appreciated - Continue to monitor w/ safety precautions     UTI (urinary tract infection) - previous culture 4 days ago with polymicrobial results - continue Rocephin for now and follow-up urine culture.  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 Likely to be chronic changes  -Continue aspirin.  Schizoaffective disorder (HCC) - Continue Risperdal and Zoloft.  Bifascicular block - Chronic. No evidence of acute coronary syndrome.  Hypokalemia: Likely likely due to IV fluids - Replace it with oral potassium 40 mEq - Repeat BMP in a.m.  DVT prophylaxis: Lovenox ordered. Code Status: Full code. Family Communication: No family at the bedside. Disposition Plan: From an assisted living facility with plans to return there with home therapy. I waiting for patient to be crit close to baseline.  Consultants:   Neurology   Procedures:   None    Antimicrobials:  Rocephin  (05/03/16) will continue for now, will switch to PO when patient more alert   Subjective:  Patient seen and examined at bedside. Her awake more responsive than yesterday seems to be improving. Her son patient has improved but not baseline yet. Was able to understand better and follow commands. Patient continues with poor appetite. Denies pain, dizziness and weakness  Objective: Vitals:   05/04/16 1400 05/04/16 2139 05/05/16 0408 05/05/16 1027  BP: (!) 175/80 (!) 177/85 (!) 169/88 (!) 157/76  Pulse: 78 83 90 75  Resp: 18 17 20 18   Temp: 98 F (36.7 C) 99.2 F (37.3 C) 98.9 F (37.2 C)   TempSrc: Oral Oral Oral   SpO2: 99% 99% 98%   Weight:      Height:        Intake/Output Summary (Last 24 hours) at 05/05/16 1317 Last data filed at 05/05/16 1200  Gross per 24 hour  Intake          2127.08 ml  Output  0 ml  Net          2127.08 ml   Filed Weights   05/03/16 1412  Weight: 72.6 kg (160 lb)    Examination:  General exam: Appears calm and comfortable  HEENT: PERRLA, OP dry mucosa - improving. Respiratory system: Clear to auscultation. Respiratory effort normal. Cardiovascular system: S1 & S2 heard, RRR. No JVD, murmurs, rubs, gallops or clicks. Gastrointestinal system: Abdomen,  is nondistended, nontender and soft. No organomegaly or masses felt. Normal bowel sounds heard. Central nervous system: Awake, oriented to person, not time or place, follow commands. No focal deficits Extremities: Good ROM, No edema, Pedal pulses 2+.  Skin: No rashes, lesions or ulcers Psychiatry:  Mood & affect flat     Data Reviewed: I have personally reviewed following labs and imaging studies  CBC:  Recent Labs Lab 05/01/16 1640 05/03/16 1505 05/05/16 0728  WBC 7.4 12.2* 10.1  NEUTROABS 4.9  --   --   HGB 12.0 11.8* 10.4*  HCT 37.6 37.2 32.1*  MCV 93.3 92.5 91.2  PLT 260 262 [id number]   Basic Metabolic Panel:  Recent Labs Lab 05/01/16 1640 05/03/16 1505 05/04/16 0411 05/05/16 0728  NA 138 140 142 141  K 3.8 3.6 3.5 3.4*  CL 110 112* 114* 116*  CO2 22 20* 18* 18*  GLUCOSE 96 93 92 107*  BUN 8 11 11 9   CREATININE 0.87 0.87 0.93 0.81  CALCIUM 10.7* 10.6* 10.2  10.0 9.6   GFR: Estimated Creatinine Clearance: 69.1 mL/min (by C-G formula based on SCr of 0.81 mg/dL). Liver Function Tests:  Recent Labs Lab 05/01/16 1640 05/03/16 1921  AST 23 24  ALT 26 25  ALKPHOS 88 91  BILITOT 0.6 0.8  PROT 6.6 6.8  ALBUMIN 4.1 4.0   No results for input(s): LIPASE, AMYLASE in the last 168 hours.  Recent Labs Lab 05/05/16 0728  AMMONIA 20   Coagulation Profile: No results for input(s): INR, PROTIME in the last 168 hours. Cardiac Enzymes: No results for input(s): CKTOTAL, CKMB, CKMBINDEX, TROPONINI in the last 168 hours. BNP (last 3 results) No results for input(s): PROBNP in the last 8760 hours. HbA1C:  Recent Labs  05/04/16 0411  HGBA1C 5.1   CBG:  Recent Labs Lab 05/03/16 1502  GLUCAP 98   Lipid Profile: No results for input(s): CHOL, HDL, LDLCALC, TRIG, CHOLHDL, LDLDIRECT in the last 72 hours. Thyroid Function Tests:  Recent Labs  05/04/16 0411  TSH 1.753   Anemia Panel:  Recent Labs  05/04/16 0411  VITAMINB12 761   Sepsis Labs:  Recent  Labs Lab 05/03/16 1520 05/03/16 1802  LATICACIDVEN 0.68 0.64    Recent Results (from the past 240 hour(s))  Urine culture     Status: Abnormal   Collection Time: 05/01/16  5:07 PM  Result Value Ref Range Status   Specimen Description URINE, CLEAN CATCH  Final   Special Requests NONE  Final   Culture MULTIPLE SPECIES PRESENT, SUGGEST RECOLLECTION (A)  Final   Report Status 05/03/2016 FINAL  Final  Urine culture     Status: None (Preliminary result)   Collection Time: 05/03/16  3:09 PM  Result Value Ref Range Status   Specimen Description URINE, CLEAN CATCH  Final   Special Requests Normal  Final   Culture CULTURE REINCUBATED FOR BETTER GROWTH  Final   Report Status PENDING  Incomplete  Blood culture (routine x 2)     Status: None (Preliminary result)   Collection Time: 05/03/16  5:03 PM  Result Value Ref Range Status   Specimen Description BLOOD RIGHT ARM  Final   Special Requests BOTTLES DRAWN AEROBIC AND ANAEROBIC 5CC  Final   Culture NO GROWTH 2 DAYS  Final   Report Status PENDING  Incomplete  Blood culture (routine x 2)     Status: None (Preliminary result)   Collection Time: 05/03/16  5:05 PM  Result Value Ref Range Status   Specimen Description BLOOD LEFT HAND  Final   Special Requests IN PEDIATRIC BOTTLE 4CC  Final   Culture NO GROWTH 2 DAYS  Final   Report Status PENDING  Incomplete  MRSA PCR Screening     Status: None   Collection Time: 05/03/16  9:18 PM  Result Value Ref Range Status   MRSA by PCR NEGATIVE NEGATIVE Final    Comment:        The GeneXpert MRSA Assay (FDA approved for NASAL specimens only), is one component of a comprehensive MRSA colonization surveillance program. It is not intended to diagnose MRSA infection nor to guide or monitor treatment for MRSA infections.       Radiology Studies: Dg Chest 2 View  Result Date: 05/03/2016 CLINICAL DATA:  Weakness.  Altered mental status . EXAM: CHEST  2 VIEW COMPARISON:  05/01/2016.  08/05/2011 .  FINDINGS: Heart size stable. Mild right base subsegmental atelectasis and or infiltrate. Rounded density projected over the right perihilar region most likely pulmonary vessel on end. Follow-up chest x-rays recommended to demonstrate resolution of these findings. No pleural effusion or pneumothorax. Thoracic spine scoliosis. Prior thoracolumbar spine spine fusion. IMPRESSION: 1. Mild right base subsegmental atelectasis and or infiltrate. Rounded density projected the right perihilar region, most likely a pulmonary vessel on end. Follow-up chest x-rays recommended to demonstrate resolution of these findings. 2. Thoracolumbar spine scoliosis with thoracolumbar spine fusion again noted . Electronically Signed   By: Marcello Moores  Register   On: 05/03/2016 16:18   Ct Head Wo Contrast  Result Date: 05/03/2016 CLINICAL DATA:  Altered mental status. Weakness starting 2 days ago. Fall. Right arm pain. EXAM: CT HEAD WITHOUT CONTRAST TECHNIQUE: Contiguous axial images were obtained from the base of the skull through the vertex without intravenous contrast. COMPARISON:  05/01/2016 FINDINGS: Brain: There is difficulty with head positioning resulting in asymmetry of slice position between right and left sides. Small stable remote lacunar infarct in the right lentiform nucleus, image 16/3. Periventricular white matter and corona radiata hypodensities favor chronic ischemic microvascular white matter disease. Otherwise, the brainstem, cerebellum, cerebral peduncles, thalami, basal ganglia, basilar cisterns, and ventricular system appear within normal limits. No intracranial hemorrhage, mass lesion, or acute CVA. Vascular: There is atherosclerotic calcification of the cavernous carotid arteries bilaterally. Skull: Unremarkable Sinuses/Orbits: Mild chronic ethmoid sinusitis. Mild chronic left frontal sinusitis. Other: Large plugs of cerumen (2.0 cm in length on the left and 1.6 cm in length on the right) appear to completely occlude the  external auditory canals bilaterally, and likely interfere with auditory acuity. IMPRESSION: 1. No acute intracranial findings. 2. Periventricular white matter and corona radiata hypodensities favor chronic ischemic microvascular white matter disease. 3. Small remote lacunar infarct in the right lentiform nucleus. 4. Very large plugs of cerumen completely occlude the external auditory canals bilaterally, and likely interfere with auditory acuity. Electronically Signed   By: Van Clines M.D.   On: 05/03/2016 15:53   Mr Brain Wo Contrast  Result Date: 05/04/2016 CLINICAL DATA:  Altered mental status. EXAM: MRI HEAD WITHOUT CONTRAST TECHNIQUE: Multiplanar,  multiecho pulse sequences of the brain and surrounding structures were obtained without intravenous contrast. COMPARISON:  Head CT 05/03/2016, brain MRI 09/20/2007 FINDINGS: Despite efforts by the technologist and patient, motion artifact is present on today's examination and could not be eliminated. This reduces the sensitivity and specificity of the study. The examination could not be completed due to the patient's altered mental status and inability to cooperate with the technologist instructions. Diffusion weighted imaging, axial gradient echo and axial T2 weighted sequences were obtained. Brain: No acute infarct or intraparenchymal hemorrhage. There is multifocal periventricular and subcortical hyperintense T2 weighted signal compatible with chronic microvascular disease. No midline shift. No hydrocephalus. Vascular: No evidence of chronic microhemorrhage or amyloid angiopathy. Sinuses/Orbits: No fluid levels or advanced mucosal thickening. No mastoid effusion. IMPRESSION: 1. Truncated examination, as above. 2. No acute infarct, midline shift or evidence of acute hemorrhage. Electronically Signed   By: Ulyses Jarred M.D.   On: 05/04/2016 22:11        Scheduled Meds: . aspirin EC  81 mg Oral Daily  . cefTRIAXone (ROCEPHIN)  IV  1 g Intravenous  Q24H  . enoxaparin (LOVENOX) injection  40 mg Subcutaneous Q24H  . lisinopril  5 mg Oral Daily  . metoprolol tartrate  12.5 mg Oral BID  . polyethylene glycol  17 g Oral Daily  . risperiDONE  3 mg Oral BID  . sertraline  150 mg Oral QHS   Continuous Infusions: . sodium chloride       LOS: 1 day    Time spent: 60 minutes   Doreatha Lew, MD [facility] Pager [phone]  If 7PM-7AM, please contact night-coverage [email or web address] Password [email or web address] 05/05/2016, 1:17 PM

## 2016-05-06 LAB — BASIC METABOLIC PANEL
Anion gap: 8 (ref 5–15)
BUN: 6 mg/dL (ref 6–20)
CHLORIDE: 114 mmol/L — AB (ref 101–111)
CO2: 20 mmol/L — ABNORMAL LOW (ref 22–32)
CREATININE: 0.74 mg/dL (ref 0.44–1.00)
Calcium: 9.6 mg/dL (ref 8.9–10.3)
GFR calc Af Amer: >60 mL/min (ref >60)
GFR calc non Af Amer: >60 mL/min (ref >60)
Glucose, Bld: 92 mg/dL (ref 65–99)
Potassium: 3.7 mmol/L (ref 3.5–5.1)
SODIUM: 142 mmol/L (ref 135–145)

## 2016-05-06 LAB — PHOSPHORUS: Phosphorus: 2.2 mg/dL — ABNORMAL LOW (ref 2.5–4.6)

## 2016-05-06 LAB — URINE CULTURE: SPECIAL REQUESTS: NORMAL

## 2016-05-06 LAB — MAGNESIUM: MAGNESIUM: 1.7 mg/dL (ref 1.7–2.4)

## 2016-05-06 MED ORDER — AMOXICILLIN-POT CLAVULANATE 875-125 MG PO TABS: 1.0000 | ORAL_TABLET | Freq: Two times a day (BID) | ORAL | 0 refills | Status: AC

## 2016-05-06 MED ORDER — AMOXICILLIN-POT CLAVULANATE 875-125 MG PO TABS
1.0000 | ORAL_TABLET | Freq: Two times a day (BID) | ORAL | Status: DC
  Administered 2016-05-06: 1 via ORAL
  Filled 2016-05-06: qty 1

## 2016-05-06 MED ORDER — METOPROLOL TARTRATE 25 MG PO TABS
25.0000 mg | ORAL_TABLET | Freq: Two times a day (BID) | ORAL | Status: DC
  Administered 2016-05-06: 25 mg via ORAL
  Filled 2016-05-06: qty 1

## 2016-05-06 MED ORDER — METOPROLOL TARTRATE 25 MG PO TABS: 25.0000 mg | ORAL_TABLET | Freq: Two times a day (BID) | ORAL | 0 refills | Status: DC

## 2016-05-06 MED ORDER — ASPIRIN 81 MG PO TBEC: 81.0000 mg | DELAYED_RELEASE_TABLET | Freq: Every day | ORAL | 0 refills | Status: DC

## 2016-05-06 NOTE — Discharge Summary (Signed)
Physician Discharge Summary  Heidi Blevins [id number] DOB: 04-Nov-1950 DOA: 05/03/2016  PCP: [name], MD  Admit date: 05/03/2016 Discharge date: 05/06/2016  Admitted From:  ALF Disposition: ALF with HHPT  Recommendations for Outpatient Follow-up:  1. Follow up with PCP in 1 weeks 2. Repeat UA  1 week after antibiotic therapy is completed 3. Encourage oral hydration  4. Home PT   Home Health:  PT    Discharge Condition: Stable CODE STATUS: FULL Diet recommendation: Dysphagia 2 (fine chop);Thin liquid  Brief/Interim Summary:  Heidi Blevins an 65 y.o.femalewith a PMH of schizoaffective disorder who resides in an assisted living facility and who presented to the hospital for evaluation of weakness and multiple falls. Admitted for UTI and acute encephalopathy. CT scan of her head showed no acute events. Chest x-ray showed mild right base subsegmental atelectasis and/or infiltrate. WBC is mildly elevated at 12.2. Chemistries show elevated calcium. Sedating meds placed on hold, labs for reversible causes of metabolic encephalopathy negative. Neurology consulted, EEG and MRI negative. White count down to normal, urine cultures positive for multiple organisms, blood cultures no growth over 48hrs. Patient mental status has return to baseline. Case discussed with her son, whom agrees the patient is back to baseline. Patient will continue on antibiotics for 10 more days and will be discharge to her ALF today with physical therapy.   Discharge Diagnoses:  Principal Problem:   Acute encephalopathy Active Problems:   Complicated UTI (urinary tract infection)   Hypercalcemia   Dehydration   Cerebrovascular disease   Schizoaffective disorder (HCC)   Bifascicular block  Acute encephalopathy/Dehydration - Secondary to UTI unspecified organism - resolved Likely secondary to UTI/dehydration, labs for reversible causes of metabolic encephalopathy negative. 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 Likely to be chronic changes  -Continue aspirin.  Schizoaffective disorder (HCC) - Continue Risperdal and Zoloft. - Avoid sedating medications  Bifascicular block - Chronic. No evidence of acute coronary syndrome.   Discharge Instructions  Discharge Instructions    Diet - low sodium heart healthy    Complete by:  As directed   Increase activity slowly    Complete by:  As directed       Medication List    STOP taking these medications   hydrOXYzine 50 MG tablet Commonly known as:  ATARAX/VISTARIL     TAKE these medications   acetaminophen 325 MG tablet Commonly known as:  TYLENOL Take 650 mg by mouth every 6 (six) hours as needed. For fever/pain.   amoxicillin-clavulanate 875-125 MG tablet Commonly known as:  AUGMENTIN Take 1 tablet by mouth every 12 (twelve) hours.   aspirin 81 MG EC tablet Take 1 tablet (81 mg total) by mouth daily.   guaifenesin 100 MG/5ML syrup Commonly known as:  ROBITUSSIN Take 200 mg by mouth every 6 (six) hours as needed for cough.   lisinopril 5 MG tablet Commonly known as:  PRINIVIL,ZESTRIL Take 5 mg by mouth daily.   metoprolol tartrate 25 MG tablet Commonly known as:  LOPRESSOR Take 1 tablet (25 mg total) by  mouth 2 (two) times daily. What changed:  how much to take   risperiDONE 3 MG tablet Commonly known as:  RISPERDAL Take 3 mg by mouth 2 (two) times daily.   sertraline 100 MG tablet Commonly known as:  ZOLOFT Take 150 mg  by mouth at bedtime.       Allergies  Allergen Reactions  . Sulfonamide Derivatives     Consultations:  Neurology, Wallie Char, MD   Procedures/Studies: Dg Chest 2 View  Result Date: 05/03/2016 CLINICAL DATA:  Weakness.  Altered mental status . EXAM: CHEST  2 VIEW COMPARISON:  05/01/2016.  08/05/2011 . FINDINGS: Heart size stable. Mild right base subsegmental atelectasis and or infiltrate. Rounded density projected over the right perihilar region most likely pulmonary vessel on end. Follow-up chest x-rays recommended to demonstrate resolution of these findings. No pleural effusion or pneumothorax. Thoracic spine scoliosis. Prior thoracolumbar spine spine fusion. IMPRESSION: 1. Mild right base subsegmental atelectasis and or infiltrate. Rounded density projected the right perihilar region, most likely a pulmonary vessel on end. Follow-up chest x-rays recommended to demonstrate resolution of these findings. 2. Thoracolumbar spine scoliosis with thoracolumbar spine fusion again noted . Electronically Signed   By: Marcello Moores  Register   On: 05/03/2016 16:18   Dg Chest 2 View  Result Date: 05/01/2016 CLINICAL DATA:  Fall. Hx of Arrhythmia, schizoaffective disorder. Daily smoker. EXAM: CHEST  2 VIEW COMPARISON:  08/05/2011 FINDINGS: The heart is enlarged. There are no focal consolidations or pleural effusions. No pulmonary edema. Stable appearance of scoliosis and spinal fixation rods. IMPRESSION: Cardiomegaly without pulmonary edema. Electronically Signed   By: Nolon Nations M.D.   On: 05/01/2016 15:57   Ct Head Wo Contrast  Result Date: 05/03/2016 CLINICAL DATA:  Altered mental status. Weakness starting 2 days ago. Fall. Right arm pain. EXAM: CT HEAD WITHOUT CONTRAST TECHNIQUE: Contiguous axial images were obtained from the base of the skull through the vertex without intravenous contrast. COMPARISON:  05/01/2016 FINDINGS: Brain: There is difficulty with head positioning resulting in asymmetry  of slice position between right and left sides. Small stable remote lacunar infarct in the right lentiform nucleus, image 16/3. Periventricular white matter and corona radiata hypodensities favor chronic ischemic microvascular white matter disease. Otherwise, the brainstem, cerebellum, cerebral peduncles, thalami, basal ganglia, basilar cisterns, and ventricular system appear within normal limits. No intracranial hemorrhage, mass lesion, or acute CVA. Vascular: There is atherosclerotic calcification of the cavernous carotid arteries bilaterally. Skull: Unremarkable Sinuses/Orbits: Mild chronic ethmoid sinusitis. Mild chronic left frontal sinusitis. Other: Large plugs of cerumen (2.0 cm in length on the left and 1.6 cm in length on the right) appear to completely occlude the external auditory canals bilaterally, and likely interfere with auditory acuity. IMPRESSION: 1. No acute intracranial findings. 2. Periventricular white matter and corona radiata hypodensities favor chronic ischemic microvascular white matter disease. 3. Small remote lacunar infarct in the right lentiform nucleus. 4. Very large plugs of cerumen completely occlude the external auditory canals bilaterally, and likely interfere with auditory acuity. Electronically Signed   By: Van Clines M.D.   On: 05/03/2016 15:53   Ct Head Wo Contrast  Result Date: 05/01/2016 CLINICAL DATA:  Status post fall today. Headache and neck pain. Initial encounter. EXAM: CT HEAD WITHOUT CONTRAST CT CERVICAL SPINE WITHOUT CONTRAST TECHNIQUE: Multidetector CT imaging of the head and cervical spine was performed following the standard protocol without intravenous contrast. Multiplanar CT image reconstructions of the cervical spine were also generated. COMPARISON:  Head CT scan 11/13/2007. FINDINGS:  CT HEAD FINDINGS There is some chronic microvascular ischemic change. No evidence of acute abnormality including hemorrhage, infarct, mass lesion, mass effect, midline  shift or abnormal extra-axial fluid collection. No hydrocephalus or pneumocephalus. The calvarium is intact. Imaged paranasal sinuses and mastoid air cells are clear. CT CERVICAL SPINE FINDINGS Vertebral body height and alignment are maintained. Mild loss of disc space height is seen at C4-5 and C5-6 with some uncovertebral spurring. Lung apices are clear. IMPRESSION: No acute abnormality head or cervical spine. Electronically Signed   By: Inge Rise M.D.   On: 05/01/2016 16:38   Ct Cervical Spine Wo Contrast  Result Date: 05/01/2016 CLINICAL DATA:  Status post fall today. Headache and neck pain. Initial encounter. EXAM: CT HEAD WITHOUT CONTRAST CT CERVICAL SPINE WITHOUT CONTRAST TECHNIQUE: Multidetector CT imaging of the head and cervical spine was performed following the standard protocol without intravenous contrast. Multiplanar CT image reconstructions of the cervical spine were also generated. COMPARISON:  Head CT scan 11/13/2007. FINDINGS: CT HEAD FINDINGS There is some chronic microvascular ischemic change. No evidence of acute abnormality including hemorrhage, infarct, mass lesion, mass effect, midline shift or abnormal extra-axial fluid collection. No hydrocephalus or pneumocephalus. The calvarium is intact. Imaged paranasal sinuses and mastoid air cells are clear. CT CERVICAL SPINE FINDINGS Vertebral body height and alignment are maintained. Mild loss of disc space height is seen at C4-5 and C5-6 with some uncovertebral spurring. Lung apices are clear. IMPRESSION: No acute abnormality head or cervical spine. Electronically Signed   By: Inge Rise M.D.   On: 05/01/2016 16:38   Mr Brain Wo Contrast  Result Date: 05/04/2016 CLINICAL DATA:  Altered mental status. EXAM: MRI HEAD WITHOUT CONTRAST TECHNIQUE: Multiplanar, multiecho pulse sequences of the brain and surrounding structures were obtained without intravenous contrast. COMPARISON:  Head CT 05/03/2016, brain MRI 09/20/2007 FINDINGS:  Despite efforts by the technologist and patient, motion artifact is present on today's examination and could not be eliminated. This reduces the sensitivity and specificity of the study. The examination could not be completed due to the patient's altered mental status and inability to cooperate with the technologist instructions. Diffusion weighted imaging, axial gradient echo and axial T2 weighted sequences were obtained. Brain: No acute infarct or intraparenchymal hemorrhage. There is multifocal periventricular and subcortical hyperintense T2 weighted signal compatible with chronic microvascular disease. No midline shift. No hydrocephalus. Vascular: No evidence of chronic microhemorrhage or amyloid angiopathy. Sinuses/Orbits: No fluid levels or advanced mucosal thickening. No mastoid effusion. IMPRESSION: 1. Truncated examination, as above. 2. No acute infarct, midline shift or evidence of acute hemorrhage. Electronically Signed   By: Ulyses Jarred M.D.   On: 05/04/2016 22:11     Subjective:  Patient seen and examined at bedside. Significantly improved and yesterday, today awake and alert. Patient was able to carry a full conversation and follow commands. Appetite has increased and patient states eating well. Denies chest pain, dizziness, weakness, nausea, vomiting, diarrhea and shortness of breath. Spoke with son [name]) he believed the patient is back to baseline.   Discharge Exam: Vitals:   05/05/16 2137 05/06/16 0540  BP: (!) 181/83 (!) 173/82  Pulse: 72 72  Resp: 20 18  Temp: 98.2 F (36.8 C) 98.2 F (36.8 C)   Vitals:   05/05/16 1027 05/05/16 1431 05/05/16 2137 05/06/16 0540  BP: (!) 157/76 (!) 160/86 (!) 181/83 (!) 173/82  Pulse: 75 73 72 72  Resp: 18 18 20 18   Temp:  98.3 F (36.8 C) 98.2 F (36.8  C) 98.2 F (36.8 C)  TempSrc:  Oral  Oral  SpO2:  94% 97% 99%  Weight:      Height:        General: Pt is alert, awake, not in acute distress Cardiovascular: RRR, S1/S2 +, no  rubs, no gallops Respiratory: CTA bilaterally, no wheezing, no rhonchi Abdominal: Soft, NT, ND, bowel sounds + Extremities: no edema, no cyanosis    The results of significant diagnostics from this hospitalization (including imaging, microbiology, ancillary and laboratory) are listed below for reference.     Microbiology: Recent Results (from the past 240 hour(s))  Urine culture     Status: Abnormal   Collection Time: 05/01/16  5:07 PM  Result Value Ref Range Status   Specimen Description URINE, CLEAN CATCH  Final   Special Requests NONE  Final   Culture MULTIPLE SPECIES PRESENT, SUGGEST RECOLLECTION (A)  Final   Report Status 05/03/2016 FINAL  Final  Urine culture     Status: Abnormal   Collection Time: 05/03/16  3:09 PM  Result Value Ref Range Status   Specimen Description URINE, CLEAN CATCH  Final   Special Requests Normal  Final   Culture MULTIPLE SPECIES PRESENT, SUGGEST RECOLLECTION (A)  Final   Report Status 05/06/2016 FINAL  Final  Blood culture (routine x 2)     Status: None (Preliminary result)   Collection Time: 05/03/16  5:03 PM  Result Value Ref Range Status   Specimen Description BLOOD RIGHT ARM  Final   Special Requests BOTTLES DRAWN AEROBIC AND ANAEROBIC 5CC  Final   Culture NO GROWTH 3 DAYS  Final   Report Status PENDING  Incomplete  Blood culture (routine x 2)     Status: None (Preliminary result)   Collection Time: 05/03/16  5:05 PM  Result Value Ref Range Status   Specimen Description BLOOD LEFT HAND  Final   Special Requests IN PEDIATRIC BOTTLE 4CC  Final   Culture NO GROWTH 3 DAYS  Final   Report Status PENDING  Incomplete  MRSA PCR Screening     Status: None   Collection Time: 05/03/16  9:18 PM  Result Value Ref Range Status   MRSA by PCR NEGATIVE NEGATIVE Final    Comment:        The GeneXpert MRSA Assay (FDA approved for NASAL specimens only), is one component of a comprehensive MRSA colonization surveillance program. It is not intended to  diagnose MRSA infection nor to guide or monitor treatment for MRSA infections.      Labs: BNP (last 3 results) No results for input(s): BNP in the last 8760 hours. Basic Metabolic Panel:  Recent Labs Lab 05/01/16 1640 05/03/16 1505 05/04/16 0411 05/05/16 0723 05/05/16 0728 05/06/16 0721  NA 138 140 142  --  141 142  K 3.8 3.6 3.5  --  3.4* 3.7  CL 110 112* 114*  --  116* 114*  CO2 22 20* 18*  --  18* 20*  GLUCOSE 96 93 92  --  107* 92  BUN 8 11 11   --  9 6  CREATININE 0.87 0.87 0.93  --  0.81 0.74  CALCIUM 10.7* 10.6* 10.2  10.0  --  9.6 9.6  MG  --   --   --  1.7  --  1.7  PHOS  --   --   --  2.2*  --  2.2*   Liver Function Tests:  Recent Labs Lab 05/01/16 1640 05/03/16 1921  AST 23 24  ALT  26 25  ALKPHOS 88 91  BILITOT 0.6 0.8  PROT 6.6 6.8  ALBUMIN 4.1 4.0   No results for input(s): LIPASE, AMYLASE in the last 168 hours.  Recent Labs Lab 05/05/16 0728  AMMONIA 20   CBC:  Recent Labs Lab 05/01/16 1640 05/03/16 1505 05/05/16 0728  WBC 7.4 12.2* 10.1  NEUTROABS 4.9  --   --   HGB 12.0 11.8* 10.4*  HCT 37.6 37.2 32.1*  MCV 93.3 92.5 91.2  PLT 260 262 219   Cardiac Enzymes: No results for input(s): CKTOTAL, CKMB, CKMBINDEX, TROPONINI in the last 168 hours. BNP: Invalid input(s): POCBNP CBG:  Recent Labs Lab 05/03/16 1502  GLUCAP 98   D-Dimer No results for input(s): DDIMER in the last 72 hours. Hgb A1c  Recent Labs  05/04/16 0411  HGBA1C 5.1   Lipid Profile No results for input(s): CHOL, HDL, LDLCALC, TRIG, CHOLHDL, LDLDIRECT in the last 72 hours. Thyroid function studies  Recent Labs  05/04/16 0411  TSH 1.753   Anemia work up  Recent Labs  05/04/16 0411  VITAMINB12 761   Urinalysis    Component Value Date/Time   COLORURINE YELLOW 05/03/2016 1509   APPEARANCEUR TURBID (A) 05/03/2016 1509   LABSPEC 1.018 05/03/2016 1509   PHURINE 6.5 05/03/2016 1509   GLUCOSEU NEGATIVE 05/03/2016 1509   HGBUR TRACE (A)  05/03/2016 1509   BILIRUBINUR NEGATIVE 05/03/2016 1509   KETONESUR 15 (A) 05/03/2016 1509   PROTEINUR 30 (A) 05/03/2016 1509   UROBILINOGEN 1.0 08/05/2011 1751   NITRITE NEGATIVE 05/03/2016 1509   LEUKOCYTESUR LARGE (A) 05/03/2016 1509   Sepsis Labs Invalid input(s): PROCALCITONIN,  WBC,  LACTICIDVEN Microbiology Recent Results (from the past 240 hour(s))  Urine culture     Status: Abnormal   Collection Time: 05/01/16  5:07 PM  Result Value Ref Range Status   Specimen Description URINE, CLEAN CATCH  Final   Special Requests NONE  Final   Culture MULTIPLE SPECIES PRESENT, SUGGEST RECOLLECTION (A)  Final   Report Status 05/03/2016 FINAL  Final  Urine culture     Status: Abnormal   Collection Time: 05/03/16  3:09 PM  Result Value Ref Range Status   Specimen Description URINE, CLEAN CATCH  Final   Special Requests Normal  Final   Culture MULTIPLE SPECIES PRESENT, SUGGEST RECOLLECTION (A)  Final   Report Status 05/06/2016 FINAL  Final  Blood culture (routine x 2)     Status: None (Preliminary result)   Collection Time: 05/03/16  5:03 PM  Result Value Ref Range Status   Specimen Description BLOOD RIGHT ARM  Final   Special Requests BOTTLES DRAWN AEROBIC AND ANAEROBIC 5CC  Final   Culture NO GROWTH 3 DAYS  Final   Report Status PENDING  Incomplete  Blood culture (routine x 2)     Status: None (Preliminary result)   Collection Time: 05/03/16  5:05 PM  Result Value Ref Range Status   Specimen Description BLOOD LEFT HAND  Final   Special Requests IN PEDIATRIC BOTTLE 4CC  Final   Culture NO GROWTH 3 DAYS  Final   Report Status PENDING  Incomplete  MRSA PCR Screening     Status: None   Collection Time: 05/03/16  9:18 PM  Result Value Ref Range Status   MRSA by PCR NEGATIVE NEGATIVE Final    Comment:        The GeneXpert MRSA Assay (FDA approved for NASAL specimens only), is one component of a comprehensive MRSA colonization surveillance program.  It is not intended to diagnose  MRSA infection nor to guide or monitor treatment for MRSA infections.      Time coordinating discharge: 30 minutes  SIGNED:  Doreatha Lew, MD  [facility] 05/06/2016, 11:47 AM Pager   If 7PM-7AM, please contact night-coverage [email or web address] Password [email or web address]

## 2016-05-06 NOTE — NC FL2 (Signed)
[place] LEVEL OF CARE SCREENING TOOL     IDENTIFICATION  Patient Name: Heidi Blevins Birthdate: 27-Nov-1950 Sex: female Admission Date (Current Location): 05/03/2016  [facility]: [place] [facility] and [place] Number:  Herbalist and Address:  [facility], [place] [address], [place],  [address]      Provider Number: [id number]  Attending Physician Name and Address:  Doreatha Lew, MD  Relative Name and Phone Number:  Reece Agar, son, [phone]    Current Level of Care: Hospital Recommended Level of Care: [place] Prior Approval Number:    Date Approved/Denied:   PASRR Number:    Discharge Plan: Other (Comment) (ALF)    Current Diagnoses: Patient Active Problem List   Diagnosis Date Noted  . Complicated UTI (urinary tract infection) 05/03/2016  . Hypercalcemia 05/03/2016  . Dehydration 05/03/2016  . Acute encephalopathy 05/03/2016  . 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 Schizoaffective disorder ([place]) 05/03/2016  . Bifascicular block 05/03/2016    Orientation RESPIRATION BLADDER Height & Weight     Self  Normal Incontinent Weight: 72.6 kg (160 lb) Height:  5\' 5"  (165.1 cm)  BEHAVIORAL SYMPTOMS/MOOD NEUROLOGICAL BOWEL NUTRITION STATUS   (NONE)  (NONE) Continent Diet - No Table Salt Added  AMBULATORY STATUS COMMUNICATION OF NEEDS Skin   Limited Assist Verbally Normal                       Personal Care Assistance Level of Assistance  Bathing, Feeding, Dressing Bathing Assistance: Limited assistance Feeding assistance: Limited assistance Dressing Assistance: Limited assistance     Functional Limitations Info  Sight, Hearing, Speech Sight Info: Adequate Hearing Info: Adequate Speech Info: Adequate    SPECIAL CARE FACTORS FREQUENCY  PT (By licensed PT), OT (By licensed OT)     PT Frequency: min 2x/week OT Frequency: min 2x/week            Contractures      Additional Factors Info  Code  Status, Allergies, Psychotropic Code Status Info: Full Allergies Info: Sulfonamide Derivatives Psychotropic Info: Zoloft         Current Medications (05/06/2016):  This is the current hospital active medication list Current Facility-Administered Medications  Medication Dose Route Frequency Provider Last Rate Last Dose  . 0.45 % sodium chloride infusion   Intravenous Continuous Doreatha Lew, MD 75 mL/hr at 05/06/16 0559 1,000 mL at 05/06/16 0559  . acetaminophen (TYLENOL) tablet 650 mg  650 mg Oral Q6H PRN Venetia Maxon Rama, MD   650 mg at 05/06/16 0559  . amoxicillin-clavulanate (AUGMENTIN) 875-125 MG per tablet 1 tablet  1 tablet Oral Q12H Doreatha Lew, MD      . aspirin EC tablet 81 mg  81 mg Oral Daily Venetia Maxon Rama, MD   81 mg at 05/06/16 0957  . enoxaparin (LOVENOX) injection 40 mg  40 mg Subcutaneous Q24H Venetia Maxon Rama, MD   40 mg at 05/05/16 2158  . lisinopril (PRINIVIL,ZESTRIL) tablet 5 mg  5 mg Oral Daily Venetia Maxon Rama, MD   5 mg at 05/06/16 0957  . metoprolol tartrate (LOPRESSOR) tablet 25 mg  25 mg Oral BID Doreatha Lew, MD   25 mg at 05/06/16 0957  . polyethylene glycol (MIRALAX / GLYCOLAX) packet 17 g  17 g Oral Daily Venetia Maxon Rama, MD   17 g at 05/06/16 0957  . risperiDONE (RISPERDAL) tablet 3 mg  3 mg Oral BID Venetia Maxon Rama, MD  3 mg at 05/06/16 0957  . sertraline (ZOLOFT) tablet 150 mg  150 mg Oral QHS Venetia Maxon Rama, MD   150 mg at 05/05/16 2158     Discharge Medications: Medication List    STOP taking these medications   hydrOXYzine 50 MG tablet Commonly known as:  ATARAX/VISTARIL    TAKE these medications   acetaminophen 325 MG tablet Commonly known as:  TYLENOL Take 650 mg by mouth every 6 (six) hours as needed. For fever/pain.  amoxicillin-clavulanate 875-125 MG tablet Commonly known as:  AUGMENTIN Take 1 tablet by mouth every 12 (twelve) hours.  aspirin 81 MG EC tablet Take 1 tablet (81 mg total) by mouth daily.   guaifenesin 100 MG/5ML syrup Commonly known as:  ROBITUSSIN Take 200 mg by mouth every 6 (six) hours as needed for cough.  lisinopril 5 MG tablet Commonly known as:  PRINIVIL,ZESTRIL Take 5 mg by mouth daily.  metoprolol tartrate 25 MG tablet Commonly known as:  LOPRESSOR Take 1 tablet (25 mg total) by mouth 2 (two) times daily. What changed:  how much to take  risperiDONE 3 MG tablet Commonly known as:  RISPERDAL Take 3 mg by mouth 2 (two) times daily.  sertraline 100 MG tablet Commonly known as:  ZOLOFT Take 150 mg by mouth at bedtime.     Relevant Imaging Results:  Relevant Lab Results:   Additional Information SSN: [id number]    Home health PT and OT  Rigoberto Noel, LCSW

## 2016-05-06 NOTE — Clinical Social Work Note (Signed)
Per MD patient ready to DC back to [facility]F. Patent approved by Tish to return. RN, patient/family (Per RN, son Reece Agar will transport patient around 2:00PM), and facility notified of patient's DC. RN given number for report. CSW signing off at this time.   Liz Beach MSW, [place], [place], JI:[id number]

## 2016-05-06 NOTE — Discharge Instructions (Signed)
Dehydration, Adult Dehydration means your body does not have as much fluid or water as it needs. It happens when you take in less fluid than you lose. Your kidneys, brain, and heart will not work properly without the right amount of fluids.  Dehydration can range from mild to severe. It should be treated right away to help prevent it from becoming severe. HOME CARE  Drink enough fluid to keep your pee (urine) clear or pale yellow.  Drink water or fluid slowly by taking small sips. You can also try sucking on ice cubes.  Have food or drinks that contain electrolytes. Examples include bananas and sports drinks.  Take over-the-counter and prescription medicines only as told by your doctor.  Prepare oral rehydration solution (ORS) according to the instructions that came with it. Take sips of ORS every 5 minutes until your pee returns to normal.  If you are throwing up (vomiting) or have watery poop (diarrhea), keep trying to drink water, ORS, or both.  If you have watery poop, avoid:  Drinks with caffeine.  Fruit juice.  Milk.  Carbonated soft drinks.  Do not take salt tablets. This can lead to having too much sodium in your body (hypernatremia). GET HELP IF:  You cannot eat or drink without throwing up.  You have had mild watery poop for longer than 24 hours.  You have a fever. GET HELP RIGHT AWAY IF:   You have very strong thirst.  You have very bad watery poop.  You have not peed in 6-8 hours, or you have peed only a small amount of very dark pee.  You have shriveled skin.  You are dizzy, confused, or both.   This information is not intended to replace advice given to you by your health care provider. Make sure you discuss any questions you have with your health care provider.   Document Released: 06/10/2009 Document Revised: 05/05/2015 Document Reviewed: 12/30/2014 Elsevier Interactive Patient Education 2016 Elsevier Inc.   Urinary Tract Infection A urinary  tract infection (UTI) can occur any place along the urinary tract. The tract includes the kidneys, ureters, bladder, and urethra. A type of germ called bacteria often causes a UTI. UTIs are often helped with antibiotic medicine.  HOME CARE   If given, take antibiotics as told by your doctor. Finish them even if you start to feel better.  Drink enough fluids to keep your pee (urine) clear or pale yellow.  Avoid tea, drinks with caffeine, and bubbly (carbonated) drinks.  Pee often. Avoid holding your pee in for a long time.  Pee before and after having sex (intercourse).  Wipe from front to back after you poop (bowel movement) if you are a woman. Use each tissue only once. GET HELP RIGHT AWAY IF:   You have back pain.  You have lower belly (abdominal) pain.  You have chills.  You feel sick to your stomach (nauseous).  You throw up (vomit).  Your burning or discomfort with peeing does not go away.  You have a fever.  Your symptoms are not better in 3 days. MAKE SURE YOU:   Understand these instructions.  Will watch your condition.  Will get help right away if you are not doing well or get worse.   This information is not intended to replace advice given to you by your health care provider. Make sure you discuss any questions you have with your health care provider.   Document Released: 01/31/2008 Document Revised: 09/04/2014 Document Reviewed: 03/14/2012 Elsevier  Interactive Patient Education ©2016 Elsevier Inc. ° °

## 2016-05-08 LAB — CULTURE, BLOOD (ROUTINE X 2)
CULTURE: NO GROWTH
Culture: NO GROWTH

## 2016-05-16 ENCOUNTER — Emergency Department (HOSPITAL_COMMUNITY)
Admission: EM | Admit: 2016-05-16 | Discharge: 2016-05-16 | Disposition: A | Discharge status: Home / Self Care | Payer: Medicare Other | Attending: Emergency Medicine | Admitting: Emergency Medicine

## 2016-05-16 ENCOUNTER — Encounter (HOSPITAL_COMMUNITY): Payer: Self-pay

## 2016-05-16 ENCOUNTER — Emergency Department (HOSPITAL_COMMUNITY): Payer: Medicare Other

## 2016-05-16 DIAGNOSIS — Z79899 Other long term (current) drug therapy: Secondary | ICD-10-CM | POA: Diagnosis not present

## 2016-05-16 DIAGNOSIS — Z7982 Long term (current) use of aspirin: Secondary | ICD-10-CM | POA: Insufficient documentation

## 2016-05-16 DIAGNOSIS — I10 Essential (primary) hypertension: Secondary | ICD-10-CM | POA: Diagnosis not present

## 2016-05-16 DIAGNOSIS — M549 Dorsalgia, unspecified: Secondary | ICD-10-CM | POA: Diagnosis present

## 2016-05-16 DIAGNOSIS — F17210 Nicotine dependence, cigarettes, uncomplicated: Secondary | ICD-10-CM | POA: Insufficient documentation

## 2016-05-16 DIAGNOSIS — Z8673 Personal history of transient ischemic attack (TIA), and cerebral infarction without residual deficits: Secondary | ICD-10-CM | POA: Diagnosis not present

## 2016-05-16 LAB — CBC
HEMATOCRIT: 38.8 % (ref 36.0–46.0)
HEMOGLOBIN: 12.4 g/dL (ref 12.0–15.0)
MCH: 29.7 pg (ref 26.0–34.0)
MCHC: 32 g/dL (ref 30.0–36.0)
MCV: 92.8 fL (ref 78.0–100.0)
Platelets: 342 10*3/uL (ref 150–400)
RBC: 4.18 MIL/uL (ref 3.87–5.11)
RDW: 13.4 % (ref 11.5–15.5)
WBC: 9.9 10*3/uL (ref 4.0–10.5)

## 2016-05-16 LAB — LITHIUM LEVEL: Lithium Lvl: 0.78 mmol/L (ref 0.60–1.20)

## 2016-05-16 LAB — BASIC METABOLIC PANEL
ANION GAP: 6 (ref 5–15)
BUN: 19 mg/dL (ref 6–20)
CHLORIDE: 108 mmol/L (ref 101–111)
CO2: 25 mmol/L (ref 22–32)
Calcium: 10.8 mg/dL — ABNORMAL HIGH (ref 8.9–10.3)
Creatinine, Ser: 1.13 mg/dL — ABNORMAL HIGH (ref 0.44–1.00)
GFR calc Af Amer: 58 mL/min — ABNORMAL LOW (ref >60)
GFR calc non Af Amer: 50 mL/min — ABNORMAL LOW (ref >60)
GLUCOSE: 117 mg/dL — AB (ref 65–99)
POTASSIUM: 4.1 mmol/L (ref 3.5–5.1)
Sodium: 139 mmol/L (ref 135–145)

## 2016-05-16 LAB — URINALYSIS, ROUTINE W REFLEX MICROSCOPIC
BILIRUBIN URINE: NEGATIVE
GLUCOSE, UA: NEGATIVE mg/dL
HGB URINE DIPSTICK: NEGATIVE
Ketones, ur: NEGATIVE mg/dL
Leukocytes, UA: NEGATIVE
NITRITE: NEGATIVE
PH: 7 (ref 5.0–8.0)
Protein, ur: NEGATIVE mg/dL
SPECIFIC GRAVITY, URINE: 1.017 (ref 1.005–1.030)

## 2016-05-16 LAB — I-STAT TROPONIN, ED: Troponin i, poc: 0.01 ng/mL (ref 0.00–0.08)

## 2016-05-16 MED ORDER — ONDANSETRON 4 MG PO TBDP: 4.0000 mg | ORAL_TABLET | Freq: Three times a day (TID) | ORAL | 0 refills | Status: DC | PRN

## 2016-05-16 MED ORDER — HYDROCODONE-ACETAMINOPHEN 5-325 MG PO TABS: 1.0000 | ORAL_TABLET | ORAL | 0 refills | Status: DC | PRN

## 2016-05-16 NOTE — ED Triage Notes (Signed)
Per Pt, Pt is coming from assisted living with son with complaints of lower bilateral back pain and lethargy that has been consistent since patient was released from the hospital. Family reports decreased appetite and nausea.

## 2016-05-16 NOTE — ED Provider Notes (Signed)
[facility] DEPT Provider Note   CSN: [id number] Arrival date & time: 05/16/16  1205     History   Chief Complaint Chief Complaint  Patient presents with  . Back Pain    HPI Heidi Blevins is a 65 y.o. female.  The history is provided by the patient, medical records and a relative.  Back Pain   Associated symptoms include weakness.    65 year old female with history of anxiety, depression, hypertension, schizoaffective disorder, scoliosis, presenting to the ED for multiple complaints. Patient was recently admitted to the hospital and discharged about 10 days ago.  She was here for UTI and acute encephalopathy.  Per her son who is present at bedside, she seemed to get better while in the hospital and was ultimately discharged back to her assisted living facility with antibiotics and orders for physical therapy. He states currently she appears better than she was prior to last admission, however she has not returned back to baseline. He states he was contacted today for her to come to the emergency room for complaint of back pain. She did have a fall prior to her admission, none since this time.  Staff there also report she has not been eating or drinking very well and has continued to be weak. He states usually she is ambulatory without assistance, however since leaving the hospital has remained wheelchair-bound as she is too weak to walk. He is not sure how her physical therapy is going. States currently she seems oriented to her baseline without apparent confusion or changes in her speech. Patient denies any chest pain or shortness of breath. No headache or dizziness. There've not been any other recent medication changes aside from the antibiotics which she has completed.  Past Medical History:  Diagnosis Date  . Anxiety   . Arrhythmia   . Bifascicular block   . Depression   . Hypertension   . Insomnia   . Schizoaffective disorder   . Scoliosis   . Vitamin D deficiency      Patient Active Problem List   Diagnosis Date Noted  . Complicated UTI (urinary tract infection) 05/03/2016  . Hypercalcemia 05/03/2016  . Dehydration 05/03/2016  . Acute encephalopathy 05/03/2016  . 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 Schizoaffective disorder ([place]) 05/03/2016  . Bifascicular block 05/03/2016    Past Surgical History:  Procedure Laterality Date  . BACK SURGERY     "as a child after she fell out of a tree"  . TONSILLECTOMY      OB History    No data available       Home Medications    Prior to Admission medications   Medication Sig Start Date End Date Taking? Authorizing Provider  amoxicillin-clavulanate (AUGMENTIN) 875-125 MG tablet Take 1 tablet by mouth every 12 (twelve) hours. 05/06/16 05/16/16 Yes Doreatha Lew, MD  benztropine (COGENTIN) 1 MG tablet Take 1 mg by mouth 2 (two) times daily.   Yes Historical Provider, MD  cholecalciferol (VITAMIN D) 400 units TABS tablet Take 400 Units by mouth daily.   Yes Historical Provider, MD  Cranberry 500 MG TABS Take 1 tablet by mouth daily.   Yes Historical Provider, MD  cycloSPORINE (RESTASIS) 0.05 % ophthalmic emulsion Place 1 drop into both eyes 2 (two) times daily.   Yes Historical Provider, MD  fluPHENAZine (PROLIXIN) 10 MG tablet Take 10 mg by mouth 2 (two) times daily.   Yes Historical Provider, MD  fluticasone (FLONASE) 50 MCG/ACT nasal spray Place 1 spray into both  nostrils 2 (two) times daily.   Yes Historical Provider, MD  guaifenesin (ROBITUSSIN) 100 MG/5ML syrup Take 200 mg by mouth every 6 (six) hours as needed for cough.   Yes Historical Provider, MD  hydrOXYzine (ATARAX/VISTARIL) 50 MG tablet Take 50 mg by mouth 2 (two) times daily.   Yes Historical Provider, MD  ibuprofen (ADVIL,MOTRIN) 200 MG tablet Take 200 mg by mouth every 6 (six) hours as needed for moderate pain.   Yes Historical Provider, MD  lisinopril (PRINIVIL,ZESTRIL) 5 MG tablet Take 5 mg by mouth daily.   Yes Historical Provider,  MD  lithium carbonate 300 MG capsule Take 300 mg by mouth 2 (two) times daily with a meal.   Yes Historical Provider, MD  Melatonin 3 MG TABS Take 1 tablet by mouth at bedtime.   Yes Historical Provider, MD  metoprolol tartrate (LOPRESSOR) 25 MG tablet Take 1 tablet (25 mg total) by mouth 2 (two) times daily. Patient taking differently: Take 12.5 mg by mouth 2 (two) times daily.  05/06/16  Yes Doreatha Lew, MD  Propylene Glycol (SYSTANE BALANCE) 0.6 % SOLN Place 1 drop into both eyes 2 (two) times daily.   Yes Historical Provider, MD  risperiDONE (RISPERDAL) 3 MG tablet Take 3 mg by mouth 2 (two) times daily.   Yes Historical Provider, MD  sertraline (ZOLOFT) 100 MG tablet Take 150 mg by mouth at bedtime.    Yes Historical Provider, MD  aspirin EC 81 MG EC tablet Take 1 tablet (81 mg total) by mouth daily. Patient not taking: Reported on 05/16/2016 05/06/16   Doreatha Lew, MD    Family History Family History  Problem Relation Age of Onset  . CVA Mother   . Hypertension Mother   . Lupus Sister   . Hypertension Brother   . Peptic Ulcer Disease Father   . Colon cancer Neg Hx     Social History Social History  Substance Use Topics  . Smoking status: Current Every Day Smoker    Packs/day: 0.25    Years: 40.00    Types: Cigarettes  . Smokeless tobacco: Never Used  . Alcohol use No     Allergies   Sulfonamide derivatives   Review of Systems Review of Systems  Constitutional: Positive for appetite change.  Musculoskeletal: Positive for back pain.  Neurological: Positive for weakness.  All other systems reviewed and are negative.    Physical Exam Updated Vital Signs BP 107/85   Pulse 70   Temp 98.4 F (36.9 C) (Oral)   Resp 15   SpO2 99%   Physical Exam  Constitutional: She is oriented to person, place, and time. She appears well-developed and well-nourished.  HENT:  Head: Normocephalic and atraumatic.  Mouth/Throat: Oropharynx is clear and moist.  Eyes:  Conjunctivae and EOM are normal. Pupils are equal, round, and reactive to light.  Neck: Normal range of motion.  Cardiovascular: Normal rate, regular rhythm and normal heart sounds.   Pulmonary/Chest: Effort normal and breath sounds normal. She has no wheezes. She has no rhonchi. She has no rales.  Abdominal: Soft. Bowel sounds are normal. There is no CVA tenderness.  Abdomen soft, nondistended, no focal tenderness No CVA tenderness  Musculoskeletal: Normal range of motion.       Cervical back: Normal.       Thoracic back: Normal.  No gross deformities of the lumbar spine, no apparent focal tenderness to palpation; normal strength of both legs; normal sensation throughout  Neurological: She is alert  and oriented to person, place, and time.  AAO to baseline person at bedside, spontaneously moving extremities well, no focal deficits noted  Skin: Skin is warm and dry.  Psychiatric: She has a normal mood and affect.  Nursing note and vitals reviewed.    ED Treatments / Results  Labs (all labs ordered are listed, but only abnormal results are displayed) Labs Reviewed  URINALYSIS, [place] (NOT AT [facility])  BASIC METABOLIC PANEL  CBC  LITHIUM LEVEL  LITHIUM LEVEL  BASIC METABOLIC PANEL  CBC  I-STAT TROPOININ, ED    EKG  EKG Interpretation  Date/Time:  Tuesday May 16 2016 14:20:33 EDT Ventricular Rate:  71 PR Interval:    QRS Duration: 141 QT Interval:  433 QTC Calculation: 471 R Axis:   -75 Text Interpretation:  Sinus rhythm RBBB and LAFB No significant change since last tracing Abnormal ekg Confirmed by Carmin Muskrat  MD [phone]) on 05/16/2016 2:47:22 PM       Radiology Dg Lumbar Spine Complete  Result Date: 05/16/2016 CLINICAL DATA:  Low back pain radiating to the left leg. Fell at home several weeks ago. EXAM: LUMBAR SPINE - COMPLETE 4+ VIEW COMPARISON:  None. FINDINGS: Chronic thoracolumbar curvature convex to the left. Chronic spinal fixation  with visualization of a distal Harrington ride with a laminar hip on the right at the L2-3 level. Disc degeneration at L2-3, L3-4, L4-5 and L5-S1. Some deformity of the L2 and L3 vertebral bodies that are presumably chronic. There is no finding that I can identify as being acute. Osteoarthritis of the left hip is noted. 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 Spinal curvature convex to the left. 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 No acute/ traumatic finding. Electronically Signed   By: Nelson Chimes M.D.   On: 05/16/2016 15:06    Procedures Procedures (including critical care time)  Medications Ordered in ED Medications - No data to display   Initial Impression / Assessment and Plan / ED Course  I have reviewed the triage vital signs and the nursing notes.  Pertinent labs & imaging results that were available during my care of the patient were reviewed by me and considered in my medical decision making (see chart for details).  Clinical Course   65 year old female here with back pain. Recent hospital admission for UTI earlier this month. She is afebrile and nontoxic here. Son reports she still appears somewhat generally weak but is oriented to her baseline. Facility staff also reports her appetite has been somewhat poor. Her abdomen is soft and benign here. Lumbar spine without gross deformity or signs of trauma. No apparent focal tenderness on exam. She is moving her extremities well, normal sensation throughout.  Screening lab work reassuring.  UA without signs of infection.  Trop negative.  Lumbar films without evidence of fracture-- chronic thoracic scoliosis noted and degenerative changes which is likely causing her pain.  3:51 PM Patient has remained stable here.  Son reports she has remained at her baseline.  Lithium level pending.  As long as this is not in toxic range, do not see a need  for emergent admission here today and feel patient can be discharged back to her assisted living facility with pain control for her back. This was discussed with patient and son at the bedside, they both acknowledged understanding and agreed with plan of care.   Care signed out to oncoming provider at  this time to follow-up on lithium level.  Case discussed with attending physician, Dr. Vanita Panda, who evaluated patient and agrees with assessment and plan of care.  Final Clinical Impressions(s) / ED Diagnoses   Final diagnoses:  Back pain, unspecified location    New Prescriptions New Prescriptions   HYDROCODONE-ACETAMINOPHEN (NORCO/VICODIN) 5-325 MG TABLET    Take 1 tablet by mouth every 4 (four) hours as needed.   ONDANSETRON (ZOFRAN ODT) 4 MG DISINTEGRATING TABLET    Take 1 tablet (4 mg total) by mouth every 8 (eight) hours as needed for nausea.     Larene Pickett, PA-C 05/16/16 [place], PA-C 05/16/16 1556    Carmin Muskrat, MD 05/18/16 1125

## 2016-05-16 NOTE — ED Notes (Signed)
Notified by lab that lithium level blood specimen was grossly hemolyzed.

## 2016-05-16 NOTE — Discharge Instructions (Signed)
Take the prescribed medication as directed.  Norco can make you mildly sleepy-- use with caution. Follow-up with your primary care doctor. Return to the ED for new or worsening symptoms.

## 2016-05-16 NOTE — ED Notes (Signed)
Pt placed in wheelchair and taken out to her daughter in laws car.

## 2016-06-06 ENCOUNTER — Emergency Department (HOSPITAL_COMMUNITY): Payer: Medicare Other

## 2016-06-06 ENCOUNTER — Encounter (HOSPITAL_COMMUNITY): Payer: Self-pay | Admitting: Emergency Medicine

## 2016-06-06 ENCOUNTER — Emergency Department (HOSPITAL_COMMUNITY)
Admission: EM | Admit: 2016-06-06 | Discharge: 2016-06-06 | Disposition: A | Discharge status: Home / Self Care | Payer: Medicare Other | Attending: Emergency Medicine | Admitting: Emergency Medicine

## 2016-06-06 DIAGNOSIS — I10 Essential (primary) hypertension: Secondary | ICD-10-CM | POA: Insufficient documentation

## 2016-06-06 DIAGNOSIS — Z79899 Other long term (current) drug therapy: Secondary | ICD-10-CM | POA: Diagnosis not present

## 2016-06-06 DIAGNOSIS — N390 Urinary tract infection, site not specified: Secondary | ICD-10-CM | POA: Insufficient documentation

## 2016-06-06 DIAGNOSIS — F17210 Nicotine dependence, cigarettes, uncomplicated: Secondary | ICD-10-CM | POA: Diagnosis not present

## 2016-06-06 DIAGNOSIS — Z7982 Long term (current) use of aspirin: Secondary | ICD-10-CM | POA: Diagnosis not present

## 2016-06-06 LAB — COMPREHENSIVE METABOLIC PANEL
ALT: 19 U/L (ref 14–54)
ANION GAP: 8 (ref 5–15)
AST: 22 U/L (ref 15–41)
Albumin: 3.8 g/dL (ref 3.5–5.0)
Alkaline Phosphatase: 133 U/L — ABNORMAL HIGH (ref 38–126)
BUN: 17 mg/dL (ref 6–20)
CALCIUM: 10.6 mg/dL — AB (ref 8.9–10.3)
CHLORIDE: 110 mmol/L (ref 101–111)
CO2: 22 mmol/L (ref 22–32)
Creatinine, Ser: 0.92 mg/dL (ref 0.44–1.00)
Glucose, Bld: 111 mg/dL — ABNORMAL HIGH (ref 65–99)
Potassium: 4 mmol/L (ref 3.5–5.1)
SODIUM: 140 mmol/L (ref 135–145)
Total Bilirubin: 0.8 mg/dL (ref 0.3–1.2)
Total Protein: 7.2 g/dL (ref 6.5–8.1)

## 2016-06-06 LAB — CBC WITH DIFFERENTIAL/PLATELET
BASOS ABS: 0 10*3/uL (ref 0.0–0.1)
Basophils Relative: 0 %
EOS ABS: 0.3 10*3/uL (ref 0.0–0.7)
EOS PCT: 2 %
HCT: 34.7 % — ABNORMAL LOW (ref 36.0–46.0)
HEMOGLOBIN: 11.5 g/dL — AB (ref 12.0–15.0)
LYMPHS ABS: 2 10*3/uL (ref 0.7–4.0)
Lymphocytes Relative: 14 %
MCH: 29.3 pg (ref 26.0–34.0)
MCHC: 33.1 g/dL (ref 30.0–36.0)
MCV: 88.5 fL (ref 78.0–100.0)
Monocytes Absolute: 1.3 10*3/uL — ABNORMAL HIGH (ref 0.1–1.0)
Monocytes Relative: 9 %
NEUTROS PCT: 75 %
Neutro Abs: 11.3 10*3/uL — ABNORMAL HIGH (ref 1.7–7.7)
PLATELETS: 393 10*3/uL (ref 150–400)
RBC: 3.92 MIL/uL (ref 3.87–5.11)
RDW: 13.9 % (ref 11.5–15.5)
WBC: 15 10*3/uL — AB (ref 4.0–10.5)

## 2016-06-06 LAB — LIPASE, BLOOD: LIPASE: 19 U/L (ref 11–51)

## 2016-06-06 LAB — URINALYSIS, ROUTINE W REFLEX MICROSCOPIC
Bilirubin Urine: NEGATIVE
Glucose, UA: NEGATIVE mg/dL
HGB URINE DIPSTICK: NEGATIVE
Ketones, ur: NEGATIVE mg/dL
NITRITE: NEGATIVE
PROTEIN: NEGATIVE mg/dL
SPECIFIC GRAVITY, URINE: 1.013 (ref 1.005–1.030)
pH: 8 (ref 5.0–8.0)

## 2016-06-06 LAB — URINE MICROSCOPIC-ADD ON
BACTERIA UA: NONE SEEN
RBC / HPF: NONE SEEN RBC/hpf (ref 0–5)
SQUAMOUS EPITHELIAL / LPF: NONE SEEN

## 2016-06-06 MED ORDER — DEXTROSE 5 % IV SOLN
1.0000 g | Freq: Once | INTRAVENOUS | Status: AC
  Administered 2016-06-06: 1 g via INTRAVENOUS
  Filled 2016-06-06: qty 10

## 2016-06-06 MED ORDER — CEPHALEXIN 500 MG PO CAPS: 500.0000 mg | ORAL_CAPSULE | Freq: Four times a day (QID) | ORAL | 0 refills | Status: DC

## 2016-06-06 MED ORDER — SODIUM CHLORIDE 0.9 % IV SOLN
INTRAVENOUS | Status: DC
  Administered 2016-06-06: 21:00:00 via INTRAVENOUS

## 2016-06-06 MED ORDER — SODIUM CHLORIDE 0.9 % IV BOLUS (SEPSIS)
1000.0000 mL | Freq: Once | INTRAVENOUS | Status: AC
  Administered 2016-06-06: 1000 mL via INTRAVENOUS

## 2016-06-06 MED ORDER — SODIUM CHLORIDE 0.9 % IV BOLUS (SEPSIS)
1000.0000 mL | Freq: Once | INTRAVENOUS | Status: AC
Start: 2016-06-06 — End: 2016-06-06
  Administered 2016-06-06: 1000 mL via INTRAVENOUS

## 2016-06-06 MED ORDER — ONDANSETRON HCL 4 MG/2ML IJ SOLN
4.0000 mg | Freq: Once | INTRAMUSCULAR | Status: AC
  Administered 2016-06-06: 4 mg via INTRAVENOUS
  Filled 2016-06-06: qty 2

## 2016-06-06 MED ORDER — HYDROMORPHONE HCL 1 MG/ML IJ SOLN
0.5000 mg | INTRAMUSCULAR | Status: DC | PRN
  Administered 2016-06-06: 0.5 mg via INTRAVENOUS
  Filled 2016-06-06: qty 1

## 2016-06-06 NOTE — Discharge Instructions (Signed)
The urine sample does show a urinary tract infection. We are given a dose of IV antibiotics in the emergency room continue the Keflex antibiotic. Make sure to follow up with the nursing home doctor to have your urine rechecked. Make sure to drink plenty of fluids

## 2016-06-06 NOTE — Progress Notes (Signed)
Son stated he is waiting to speak to the doctor before taking his mother home. Per son his mom does not have her dentures in however her face drooped like this last time she was admitted with a urinary infection. Son stated the primary nurse is aware and he will talk to the EDP about his concerns.

## 2016-06-06 NOTE — ED Provider Notes (Signed)
[facility] DEPT Provider Note   CSN: [id number] Arrival date & time: 06/06/16  1542     History   Chief Complaint Chief Complaint  Patient presents with  . Urinary Tract Infection    HPI Heidi Blevins is a 65 y.o. female. Patient was sent to the emergency room for evaluation of decreased appetite, fatigue and lethargy. Patient has a history of prior urinary tract infections. She also has history of schizophrenia. She is a resident of [facility]. Staff was concerned that she was developing an infection so they sent her to the emergency room for evaluation. Patient herself mentions having generalized fatigue and poor appetite. She denies any dysuria. She denies any chest pain. She denies any abdominal pain. No vomiting or diarrhea.    Past Medical History:  Diagnosis Date  . Anxiety   . Arrhythmia   . Bifascicular block   . Depression   . Hypertension   . Insomnia   . Schizoaffective disorder   . Scoliosis   . Vitamin D deficiency     Patient Active Problem List   Diagnosis Date Noted  . Complicated UTI (urinary tract infection) 05/03/2016  . Hypercalcemia 05/03/2016  . Dehydration 05/03/2016  . Acute encephalopathy 05/03/2016  . 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 Schizoaffective disorder ([place]) 05/03/2016  . Bifascicular block 05/03/2016    Past Surgical History:  Procedure Laterality Date  . BACK SURGERY     "as a child after she fell out of a tree"  . TONSILLECTOMY      OB History    No data available       Home Medications    Prior to Admission medications   Medication Sig Start Date End Date Taking? Authorizing Provider  aspirin EC 81 MG EC tablet Take 1 tablet (81 mg total) by mouth daily. Patient not taking: Reported on 05/16/2016 05/06/16   Doreatha Lew, MD  benztropine (COGENTIN) 1 MG tablet Take 1 mg by mouth 2 (two) times daily.    Historical Provider, MD  cephALEXin (KEFLEX) 500 MG capsule Take 1 capsule  (500 mg total) by mouth 4 (four) times daily. 06/06/16   Dorie Rank, MD  cholecalciferol (VITAMIN D) 400 units TABS tablet Take 400 Units by mouth daily.    Historical Provider, MD  Cranberry 500 MG TABS Take 1 tablet by mouth daily.    Historical Provider, MD  cycloSPORINE (RESTASIS) 0.05 % ophthalmic emulsion Place 1 drop into both eyes 2 (two) times daily.    Historical Provider, MD  fluPHENAZine (PROLIXIN) 10 MG tablet Take 10 mg by mouth 2 (two) times daily.    Historical Provider, MD  fluticasone (FLONASE) 50 MCG/ACT nasal spray Place 1 spray into both nostrils 2 (two) times daily.    Historical Provider, MD  guaifenesin (ROBITUSSIN) 100 MG/5ML syrup Take 200 mg by mouth every 6 (six) hours as needed for cough.    Historical Provider, MD  HYDROcodone-acetaminophen (NORCO/VICODIN) 5-325 MG tablet Take 1 tablet by mouth every 4 (four) hours as needed. 05/16/16   Larene Pickett, PA-C  hydrOXYzine (ATARAX/VISTARIL) 50 MG tablet Take 50 mg by mouth 2 (two) times daily.    Historical Provider, MD  ibuprofen (ADVIL,MOTRIN) 200 MG tablet Take 200 mg by mouth every 6 (six) hours as needed for moderate pain.    Historical Provider, MD  lisinopril (PRINIVIL,ZESTRIL) 5 MG tablet Take 5 mg by mouth daily.    Historical Provider, MD  lithium carbonate 300 MG capsule  Take 300 mg by mouth 2 (two) times daily with a meal.    Historical Provider, MD  Melatonin 3 MG TABS Take 1 tablet by mouth at bedtime.    Historical Provider, MD  metoprolol tartrate (LOPRESSOR) 25 MG tablet Take 1 tablet (25 mg total) by mouth 2 (two) times daily. Patient taking differently: Take 12.5 mg by mouth 2 (two) times daily.  05/06/16   Doreatha Lew, MD  ondansetron (ZOFRAN ODT) 4 MG disintegrating tablet Take 1 tablet (4 mg total) by mouth every 8 (eight) hours as needed for nausea. 05/16/16   Larene Pickett, PA-C  Propylene Glycol (SYSTANE BALANCE) 0.6 % SOLN Place 1 drop into both eyes 2 (two) times daily.    Historical Provider,  MD  risperiDONE (RISPERDAL) 3 MG tablet Take 3 mg by mouth 2 (two) times daily.    Historical Provider, MD  sertraline (ZOLOFT) 100 MG tablet Take 150 mg by mouth at bedtime.     Historical Provider, MD    Family History Family History  Problem Relation Age of Onset  . CVA Mother   . Hypertension Mother   . Lupus Sister   . Hypertension Brother   . Peptic Ulcer Disease Father   . Colon cancer Neg Hx     Social History Social History  Substance Use Topics  . Smoking status: Current Every Day Smoker    Packs/day: 0.25    Years: 40.00    Types: Cigarettes  . Smokeless tobacco: Never Used  . Alcohol use No     Allergies   Sulfonamide derivatives   Review of Systems Review of Systems  All other systems reviewed and are negative.    Physical Exam Updated Vital Signs BP 144/98 (BP Location: Left Arm)   Pulse 101   Temp 97.5 F (36.4 C) (Oral)   Resp 15   SpO2 95%   Physical Exam  Constitutional: No distress.  Elderly  HENT:  Head: Normocephalic and atraumatic.  Right Ear: External ear normal.  Left Ear: External ear normal.  Mouth/Throat: No oropharyngeal exudate.  Mucous membranes are dry  Eyes: Conjunctivae are normal. Right eye exhibits no discharge. Left eye exhibits no discharge. No scleral icterus.  Neck: Neck supple. No tracheal deviation present.  Cardiovascular: Normal rate, regular rhythm and intact distal pulses.   Pulmonary/Chest: Effort normal and breath sounds normal. No stridor. No respiratory distress. She has no wheezes. She has no rales.  Abdominal: Soft. Bowel sounds are normal. She exhibits no distension. There is no tenderness. There is no rebound and no guarding.  Musculoskeletal: She exhibits no edema or tenderness.  Neurological: She is alert. She has normal strength. No cranial nerve deficit (no facial droop, extraocular movements intact, no slurred speech) or sensory deficit. She exhibits normal muscle tone. She displays no seizure  activity. Coordination normal.  Generalized weakness but no focal deficits, patient is able to sit up in bed, equal grip strength bilaterally, nl plantar flexion bilaterally  Skin: Skin is warm and dry. No rash noted. She is not diaphoretic.  Psychiatric: She has a normal mood and affect.  Nursing note and vitals reviewed.    ED Treatments / Results  Labs (all labs ordered are listed, but only abnormal results are displayed) Labs Reviewed  COMPREHENSIVE METABOLIC PANEL - Abnormal; Notable for the following:       Result Value   Glucose, Bld 111 (*)    Calcium 10.6 (*)    Alkaline Phosphatase 133 (*)  All other components within normal limits  CBC WITH DIFFERENTIAL/PLATELET - Abnormal; Notable for the following:    WBC 15.0 (*)    Hemoglobin 11.5 (*)    HCT 34.7 (*)    Neutro Abs 11.3 (*)    Monocytes Absolute 1.3 (*)    All other components within normal limits  URINALYSIS, ROUTINE W REFLEX MICROSCOPIC (NOT AT [facility]) - Abnormal; Notable for the following:    APPearance CLOUDY (*)    Leukocytes, UA TRACE (*)    All other components within normal limits  URINE CULTURE  LIPASE, BLOOD  URINE MICROSCOPIC-ADD ON    Radiology Dg Chest 2 View  Result Date: 06/06/2016 CLINICAL DATA:  Weakness EXAM: CHEST  2 VIEW COMPARISON:  05/03/2016 FINDINGS: Cardiomediastinal silhouette is stable. Dextroscoliosis thoracic spine again noted. Metallic rod in thoracic spine is stable. Limited study by poor inspiration. Streaky right basilar atelectasis or infiltrate. No pulmonary edema. IMPRESSION: Dextroscoliosis thoracic spine again noted. Metallic rod in thoracic spine is stable. Limited study by poor inspiration. Streaky right basilar atelectasis or infiltrate. No pulmonary edema. Electronically Signed   By: Lahoma Crocker M.D.   On: 06/06/2016 17:09    Procedures Procedures (including critical care time)  Medications Ordered in ED Medications  sodium chloride 0.9 % bolus 1,000 mL (not  administered)    And  0.9 %  sodium chloride infusion (not administered)  HYDROmorphone (DILAUDID) injection 0.5 mg (not administered)  cefTRIAXone (ROCEPHIN) 1 g in dextrose 5 % 50 mL IVPB (not administered)  ondansetron (ZOFRAN) injection 4 mg (4 mg Intravenous Given 06/06/16 1749)  sodium chloride 0.9 % bolus 1,000 mL (1,000 mLs Intravenous New Bag/Given 06/06/16 1747)     Initial Impression / Assessment and Plan / ED Course  I have reviewed the triage vital signs and the nursing notes.  Pertinent labs & imaging results that were available during my care of the patient were reviewed by me and considered in my medical decision making (see chart for details).  Clinical Course  Labs are consistent with a UTI.  PT does have an elevated WBC but is not febrile and BP is normal.  Will give, IV fluids and a dose of IV abx.  Pt should be able to continue treatment at the nursing facility.  Final Clinical Impressions(s) / ED Diagnoses   Final diagnoses:  Urinary tract infection without hematuria, site unspecified    New Prescriptions New Prescriptions   CEPHALEXIN (KEFLEX) 500 MG CAPSULE    Take 1 capsule (500 mg total) by mouth 4 (four) times daily.     Dorie Rank, MD 06/06/16 [phone]

## 2016-06-06 NOTE — ED Notes (Signed)
Bed: [id number] Expected date:  Expected time:  Means of arrival:  Comments: EMS-FTT-UTI

## 2016-06-06 NOTE — ED Triage Notes (Signed)
Patient from [facility] facility with change in behavior noted by staff.  Staff reports decreased appetite, urinating on the floor, more lethargic and fatigued than usual.  Patient is A&O x4 according to EMS.  History schizo-affective disorder.  Patient takes lithium.

## 2016-06-08 LAB — URINE CULTURE: Culture: >=100000 — AB

## 2016-06-09 ENCOUNTER — Telehealth (HOSPITAL_BASED_OUTPATIENT_CLINIC_OR_DEPARTMENT_OTHER): Payer: Self-pay

## 2016-06-09 NOTE — Telephone Encounter (Signed)
Post ED Visit - Positive Culture Follow-up: Unsuccessful Patient Follow-up  Culture assessed and recommendations reviewed by: []  Elenor Quinones, Pharm.D. []  Heide Guile, Pharm.D., BCPS []  Parks Neptune, Pharm.D. []  Alycia Rossetti, Pharm.D., BCPS []  [place], [place].D., BCPS, AAHIVP []  Legrand Como, Pharm.D., BCPS, AAHIVP []  Milus Glazier, Pharm.D. []  Stephens November, [place].D. Dimitri Ped Pharm D Positive urine culture  []  Patient discharged without antimicrobial prescription and treatment is now indicated [x]  Organism is resistant to prescribed ED discharge antimicrobial []  Patient with positive blood cultures   Unable to contact patient after 3 attempts, letter will be sent to address on file  Genia Del 06/09/2016, 10:54 AM

## 2016-06-12 ENCOUNTER — Inpatient Hospital Stay (HOSPITAL_COMMUNITY)
Admission: EM | Admit: 2016-06-12 | Discharge: 2016-06-18 | DRG: 689 | Disposition: A | Discharge status: Skilled Nursing Facility | Payer: Medicare Other | Attending: Family Medicine | Admitting: Family Medicine

## 2016-06-12 ENCOUNTER — Emergency Department (HOSPITAL_COMMUNITY): Payer: Medicare Other

## 2016-06-12 ENCOUNTER — Encounter (HOSPITAL_COMMUNITY): Payer: Self-pay | Admitting: Emergency Medicine

## 2016-06-12 DIAGNOSIS — Z7951 Long term (current) use of inhaled steroids: Secondary | ICD-10-CM

## 2016-06-12 DIAGNOSIS — F17210 Nicotine dependence, cigarettes, uncomplicated: Secondary | ICD-10-CM | POA: Diagnosis present

## 2016-06-12 DIAGNOSIS — Z823 Family history of stroke: Secondary | ICD-10-CM

## 2016-06-12 DIAGNOSIS — R4182 Altered mental status, unspecified: Secondary | ICD-10-CM | POA: Diagnosis not present

## 2016-06-12 DIAGNOSIS — N390 Urinary tract infection, site not specified: Secondary | ICD-10-CM | POA: Diagnosis not present

## 2016-06-12 DIAGNOSIS — F329 Major depressive disorder, single episode, unspecified: Secondary | ICD-10-CM | POA: Diagnosis present

## 2016-06-12 DIAGNOSIS — J690 Pneumonitis due to inhalation of food and vomit: Secondary | ICD-10-CM | POA: Diagnosis present

## 2016-06-12 DIAGNOSIS — F259 Schizoaffective disorder, unspecified: Secondary | ICD-10-CM | POA: Diagnosis present

## 2016-06-12 DIAGNOSIS — G9341 Metabolic encephalopathy: Secondary | ICD-10-CM | POA: Diagnosis present

## 2016-06-12 DIAGNOSIS — Z8249 Family history of ischemic heart disease and other diseases of the circulatory system: Secondary | ICD-10-CM

## 2016-06-12 DIAGNOSIS — F419 Anxiety disorder, unspecified: Secondary | ICD-10-CM | POA: Diagnosis present

## 2016-06-12 DIAGNOSIS — N179 Acute kidney failure, unspecified: Secondary | ICD-10-CM | POA: Diagnosis present

## 2016-06-12 DIAGNOSIS — I452 Bifascicular block: Secondary | ICD-10-CM | POA: Diagnosis present

## 2016-06-12 DIAGNOSIS — E440 Moderate protein-calorie malnutrition: Secondary | ICD-10-CM | POA: Diagnosis present

## 2016-06-12 DIAGNOSIS — Z7982 Long term (current) use of aspirin: Secondary | ICD-10-CM

## 2016-06-12 DIAGNOSIS — G9340 Encephalopathy, unspecified: Secondary | ICD-10-CM | POA: Diagnosis present

## 2016-06-12 DIAGNOSIS — I679 Cerebrovascular disease, unspecified: Secondary | ICD-10-CM | POA: Diagnosis present

## 2016-06-12 DIAGNOSIS — Z6823 Body mass index (BMI) 23.0-23.9, adult: Secondary | ICD-10-CM

## 2016-06-12 DIAGNOSIS — I10 Essential (primary) hypertension: Secondary | ICD-10-CM

## 2016-06-12 DIAGNOSIS — E860 Dehydration: Secondary | ICD-10-CM | POA: Diagnosis present

## 2016-06-12 DIAGNOSIS — M6281 Muscle weakness (generalized): Secondary | ICD-10-CM

## 2016-06-12 DIAGNOSIS — R0600 Dyspnea, unspecified: Secondary | ICD-10-CM

## 2016-06-12 DIAGNOSIS — G4700 Insomnia, unspecified: Secondary | ICD-10-CM | POA: Diagnosis present

## 2016-06-12 DIAGNOSIS — E872 Acidosis: Secondary | ICD-10-CM | POA: Diagnosis present

## 2016-06-12 LAB — CBC WITH DIFFERENTIAL/PLATELET
BASOS ABS: 0.1 10*3/uL (ref 0.0–0.1)
Basophils Relative: 1 %
EOS PCT: 2 %
Eosinophils Absolute: 0.3 10*3/uL (ref 0.0–0.7)
HEMATOCRIT: 36.1 % (ref 36.0–46.0)
Hemoglobin: 12.1 g/dL (ref 12.0–15.0)
LYMPHS PCT: 14 %
Lymphs Abs: 1.8 10*3/uL (ref 0.7–4.0)
MCH: 29.8 pg (ref 26.0–34.0)
MCHC: 33.5 g/dL (ref 30.0–36.0)
MCV: 88.9 fL (ref 78.0–100.0)
Monocytes Absolute: 1.2 10*3/uL — ABNORMAL HIGH (ref 0.1–1.0)
Monocytes Relative: 9 %
NEUTROS ABS: 9.8 10*3/uL — AB (ref 1.7–7.7)
Neutrophils Relative %: 74 %
PLATELETS: 323 10*3/uL (ref 150–400)
RBC: 4.06 MIL/uL (ref 3.87–5.11)
RDW: 13.6 % (ref 11.5–15.5)
WBC: 13.1 10*3/uL — AB (ref 4.0–10.5)

## 2016-06-12 LAB — BASIC METABOLIC PANEL
ANION GAP: 10 (ref 5–15)
BUN: 13 mg/dL (ref 6–20)
CO2: 20 mmol/L — ABNORMAL LOW (ref 22–32)
Calcium: 11.3 mg/dL — ABNORMAL HIGH (ref 8.9–10.3)
Chloride: 110 mmol/L (ref 101–111)
Creatinine, Ser: 1.23 mg/dL — ABNORMAL HIGH (ref 0.44–1.00)
GFR calc Af Amer: 52 mL/min — ABNORMAL LOW (ref >60)
GFR, EST NON AFRICAN AMERICAN: 45 mL/min — AB (ref >60)
GLUCOSE: 100 mg/dL — AB (ref 65–99)
POTASSIUM: 3.7 mmol/L (ref 3.5–5.1)
Sodium: 140 mmol/L (ref 135–145)

## 2016-06-12 MED ORDER — SODIUM CHLORIDE 0.9 % IV BOLUS (SEPSIS)
500.0000 mL | Freq: Once | INTRAVENOUS | Status: AC
  Administered 2016-06-12: 500 mL via INTRAVENOUS

## 2016-06-12 NOTE — ED Notes (Signed)
Patient transported to X-ray 

## 2016-06-12 NOTE — ED Notes (Signed)
Nurse currently starting IV,  Will get labs.

## 2016-06-12 NOTE — ED Triage Notes (Signed)
Patient arrived to ED via [facility]. EMS reports: Patient is resident at [facility]. Staff reports that son yesterday reported patient "not herself." Staff reports patient usually talkative, but not so now.  Staff also report recent UTI.  VSS. BP 130/70, Pulse 90, 96-97% on room air. CBG 110

## 2016-06-12 NOTE — ED Provider Notes (Signed)
[facility] DEPT Provider Note   CSN: [id number] Arrival date & time: 06/12/16  2219     History   Chief Complaint Chief Complaint  Patient presents with  . Altered Mental Status    HPI Heidi Blevins is a 65 y.o. female.  HPI   Level V caveat - AMS  Patient brought to the ER by EMS for altered mental status. She is recently being treated for a UTI. She comes in today with all normal vital signs. She has a PMH of anxiety, arrhythmia, bifascicular block, depression, hypertension, insomnia, schizoaffective disorder, sciolosis, Vit D deficiency.  The patient is currently non verbal and will not follow commands. Per facility, the son says she is not acting like herself. Per previous chart review, patient is verbal, is able to sit up on her own and follows commands.   Past Medical History:  Diagnosis Date  . Anxiety   . Arrhythmia   . Bifascicular block   . Depression   . Hypertension   . Insomnia   . Schizoaffective disorder   . Scoliosis   . Vitamin D deficiency     Patient Active Problem List   Diagnosis Date Noted  . AKI (acute kidney injury) ([place]) 06/13/2016  . UTI (urinary tract infection) 06/13/2016  . Complicated UTI (urinary tract infection) 05/03/2016  . Hypercalcemia 05/03/2016  . Dehydration 05/03/2016  . Acute encephalopathy 05/03/2016  . 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 Schizoaffective disorder ([place]) 05/03/2016  . Bifascicular block 05/03/2016    Past Surgical History:  Procedure Laterality Date  . BACK SURGERY     "as a child after she fell out of a tree"  . TONSILLECTOMY      OB History    No data available       Home Medications    Prior to Admission medications   Medication Sig Start Date End Date Taking? Authorizing Provider  aspirin EC 81 MG EC tablet Take 1 tablet (81 mg total) by mouth daily. 05/06/16  Yes Doreatha Lew, MD  benzocaine-resorcinol (VAGISIL) 5-2 % vaginal cream Place 1 application vaginally as  needed for itching.   Yes Historical Provider, MD  benztropine (COGENTIN) 1 MG tablet Take 1 mg by mouth 2 (two) times daily.   Yes Historical Provider, MD  cephALEXin (KEFLEX) 500 MG capsule Take 1 capsule (500 mg total) by mouth 4 (four) times daily. 06/06/16  Yes Dorie Rank, MD  cholecalciferol (VITAMIN D) 400 units TABS tablet Take 400 Units by mouth daily.   Yes Historical Provider, MD  Cranberry 500 MG TABS Take 1 tablet by mouth 2 (two) times daily.    Yes Historical Provider, MD  cycloSPORINE (RESTASIS) 0.05 % ophthalmic emulsion Place 1 drop into both eyes 2 (two) times daily.   Yes Historical Provider, MD  fluPHENAZine (PROLIXIN) 10 MG tablet Take 10 mg by mouth 2 (two) times daily.   Yes Historical Provider, MD  fluticasone (FLONASE) 50 MCG/ACT nasal spray Place 1 spray into both nostrils 2 (two) times daily.   Yes Historical Provider, MD  guaifenesin (ROBITUSSIN) 100 MG/5ML syrup Take 200 mg by mouth every 6 (six) hours as needed for cough.   Yes Historical Provider, MD  hydrOXYzine (ATARAX/VISTARIL) 50 MG tablet Take 100 mg by mouth 2 (two) times daily.    Yes Historical Provider, MD  ibuprofen (ADVIL,MOTRIN) 200 MG tablet Take 400 mg by mouth every 6 (six) hours as needed for moderate pain.    Yes Historical Provider, MD  lisinopril (  PRINIVIL,ZESTRIL) 5 MG tablet Take 5 mg by mouth daily.   Yes Historical Provider, MD  lithium carbonate 300 MG capsule Take 300 mg by mouth 2 (two) times daily with a meal.   Yes Historical Provider, MD  Melatonin 3 MG TABS Take 3 mg by mouth at bedtime.    Yes Historical Provider, MD  Nutritional Supplements (NUTRITIONAL SHAKE PLUS PO) Take 1 Container by mouth 3 (three) times daily.   Yes Historical Provider, MD  Propylene Glycol (SYSTANE BALANCE) 0.6 % SOLN Place 1 drop into both eyes 2 (two) times daily.   Yes Historical Provider, MD  risperiDONE (RISPERDAL) 3 MG tablet Take 3 mg by mouth 2 (two) times daily.   Yes Historical Provider, MD  sertraline  (ZOLOFT) 100 MG tablet Take 150 mg by mouth daily.    Yes Historical Provider, MD  HYDROcodone-acetaminophen (NORCO/VICODIN) 5-325 MG tablet Take 1 tablet by mouth every 4 (four) hours as needed. Patient not taking: Reported on 06/12/2016 05/16/16   Larene Pickett, PA-C  metoprolol tartrate (LOPRESSOR) 25 MG tablet Take 1 tablet (25 mg total) by mouth 2 (two) times daily. Patient not taking: Reported on 06/12/2016 05/06/16   Doreatha Lew, MD  ondansetron (ZOFRAN ODT) 4 MG disintegrating tablet Take 1 tablet (4 mg total) by mouth every 8 (eight) hours as needed for nausea. Patient not taking: Reported on 06/12/2016 05/16/16   Larene Pickett, PA-C    Family History Family History  Problem Relation Age of Onset  . CVA Mother   . Hypertension Mother   . Lupus Sister   . Hypertension Brother   . Peptic Ulcer Disease Father   . Colon cancer Neg Hx     Social History Social History  Substance Use Topics  . Smoking status: Current Every Day Smoker    Packs/day: 0.25    Years: 40.00    Types: Cigarettes  . Smokeless tobacco: Never Used  . Alcohol use No     Allergies   Sulfonamide derivatives   Review of Systems Review of Systems Level V caveat - AMS  Physical Exam Updated Vital Signs BP 154/90   Pulse 75   Temp 98.4 F (36.9 C) (Oral)   Resp 24   SpO2 98%   Physical Exam Constitutional: She appears well-developed and well-nourished. No distress.  HENT:  Head: Normocephalic and atraumatic.  Eyes: Conjunctivae and EOM are normal. Pupils are equal, round, and reactive to light. No scleral icterus.  Neck: Normal range of motion. Neck supple.  Cardiovascular: Normal rate, regular rhythm, normal heart sounds and intact distal pulses.   Pulmonary/Chest: Effort normal. No respiratory distress. She has no wheezes. She has no rales. She exhibits no tenderness.  Abdominal: Soft. There is no tenderness.  Musculoskeletal: She exhibits no edema or tenderness.  Neurological: She  is alert. She displays a negative Romberg sign.  Unable to asses cranial nerves d/t pts inability to follow commands.  Patient is alert but will not answer questions, respond to commands. No seizure activity noted.  Skin: Skin is warm and dry. No petechiae, no purpura and no rash noted. She is not diaphoretic.  Psychiatric: Her speech is not slurred. Cognition and memory are impaired.  Nursing note and vitals reviewed.  ED Treatments / Results  Labs (all labs ordered are listed, but only abnormal results are displayed) Labs Reviewed  URINALYSIS, ROUTINE W REFLEX MICROSCOPIC (NOT AT [facility]) - Abnormal; Notable for the following:       Result Value  APPearance CLOUDY (*)    Bilirubin Urine MODERATE (*)    Ketones, ur 15 (*)    Leukocytes, UA LARGE (*)    All other components within normal limits  CBC WITH DIFFERENTIAL/PLATELET - Abnormal; Notable for the following:    WBC 13.1 (*)    Neutro Abs 9.8 (*)    Monocytes Absolute 1.2 (*)    All other components within normal limits  BASIC METABOLIC PANEL - Abnormal; Notable for the following:    CO2 20 (*)    Glucose, Bld 100 (*)    Creatinine, Ser 1.23 (*)    Calcium 11.3 (*)    GFR calc non Af Amer 45 (*)    GFR calc Af Amer 52 (*)    All other components within normal limits  URINE MICROSCOPIC-ADD ON - Abnormal; Notable for the following:    Squamous Epithelial / LPF 0-5 (*)    Bacteria, UA MANY (*)    Casts HYALINE CASTS (*)    All other components within normal limits  I-STAT ARTERIAL BLOOD GAS, ED - Abnormal; Notable for the following:    pO2, Arterial 67.0 (*)    Bicarbonate 19.2 (*)    Acid-base deficit 5.0 (*)    All other components within normal limits  URINE CULTURE  LITHIUM LEVEL  HEPATIC FUNCTION PANEL    EKG  EKG Interpretation None       Radiology Dg Chest 2 View  Result Date: 06/12/2016 CLINICAL DATA:  Initial evaluation for acute altered mental status. The EXAM: CHEST  2 VIEW COMPARISON:  Prior  radiograph from 06/06/2016. FINDINGS: Study somewhat limited by overlying arm on lateral projection. Cardiomegaly is stable.  Mediastinal silhouette unchanged. Lungs hypoinflated. Mild scattered peribronchial thickening. No focal infiltrates. No overt pulmonary edema. No pleural effusion. No pneumothorax. Scoliosis with Harrington rod fixation noted. No acute osseous abnormality. Osteopenia noted. IMPRESSION: 1. Mild scattered peribronchial thickening, nonspecific, but could reflect sequela of acute bronchiolitis in the correct clinical setting. Mild pulmonary interstitial congestion could also have this appearance. 2. Stable cardiomegaly. Electronically Signed   By: Jeannine Boga M.D.   On: 06/12/2016 23:41   Ct Head Wo Contrast  Result Date: 06/13/2016 CLINICAL DATA:  Acute onset of altered mental status. Initial encounter. EXAM: CT HEAD WITHOUT CONTRAST TECHNIQUE: Contiguous axial images were obtained from the base of the skull through the vertex without intravenous contrast. COMPARISON:  CT of the head performed 05/03/2016, and MRI of the brain performed 05/04/2016 FINDINGS: Brain: No evidence of acute infarction, hemorrhage, hydrocephalus, extra-axial collection or mass lesion/mass effect. Mild prominence of the ventricles and sulci suggests mild cortical volume loss. Mild periventricular and subcortical white matter change likely reflects small vessel ischemic microangiopathy. A chronic lacunar infarct is noted at the right basal ganglia. The brainstem and fourth ventricle are within normal limits. The cerebral hemispheres demonstrate grossly normal gray-white differentiation. No mass effect or midline shift is seen. Vascular: No hyperdense vessel or unexpected calcification. Skull: There is no evidence of fracture; visualized osseous structures are unremarkable in appearance. Sinuses/Orbits: The visualized portions of the orbits are within normal limits. The paranasal sinuses and mastoid air cells  are well-aerated. Other: No significant soft tissue abnormalities are seen. IMPRESSION: 1. No acute intracranial pathology seen on CT. 2. Mild cortical volume loss and scattered small vessel ischemic microangiopathy. 3. Chronic lacunar infarct at the right basal ganglia. Electronically Signed   By: Garald Balding M.D.   On: 06/13/2016 00:13    Procedures Procedures (  including critical care time)  Medications Ordered in ED Medications  cefTRIAXone (ROCEPHIN) 1 g in dextrose 5 % 50 mL IVPB (1 g Intravenous New Bag/Given 06/13/16 0211)  sodium chloride 0.9 % bolus 500 mL (0 mLs Intravenous Stopped 06/13/16 0110)  sodium chloride 0.9 % bolus 500 mL (0 mLs Intravenous Stopped 06/13/16 0110)     Initial Impression / Assessment and Plan / ED Course  I have reviewed the triage vital signs and the nursing notes.  Pertinent labs & imaging results that were available during my care of the patient were reviewed by me and considered in my medical decision making (see chart for details).  Clinical Course    CRITICAL CARE Performed by: Linus Mako Total critical care time: 40 minutes Critical care time was exclusive of separately billable procedures and treating other patients. Critical care was necessary to treat or prevent imminent or life-threatening deterioration. Critical care was time spent personally by me on the following activities: development of treatment plan with patient and/or surrogate as well as nursing, discussions with consultants, evaluation of patient's response to treatment, examination of patient, obtaining history from patient or surrogate, ordering and performing treatments and interventions, ordering and review of laboratory studies, ordering and review of radiographic studies, pulse oximetry and re-evaluation of patient's condition.   Patients Head CT and chest xray are unremarkable. She has a significant UTI which is the only explanation for AMS that is noted. She is  also dehydrated and according to the facility mal nourished. She moves both arms and legs but does not do so with purposeful movements. She will shy away from my touching her eyelids.  Admit to inpatient, Tele, Dr. Blaine Hamper, [place], [facility] admits. Given 1 mg IV rocephin and urine culture sent out.  Final Clinical Impressions(s) / ED Diagnoses   Final diagnoses:  Altered mental status, unspecified altered mental status type  Urinary tract infection without hematuria, site unspecified    New Prescriptions New Prescriptions   No medications on file     Delos Haring, PA-C 06/13/16 [place], DO 06/15/16 1004

## 2016-06-13 DIAGNOSIS — I679 Cerebrovascular disease, unspecified: Secondary | ICD-10-CM | POA: Diagnosis present

## 2016-06-13 DIAGNOSIS — F258 Other schizoaffective disorders: Secondary | ICD-10-CM

## 2016-06-13 DIAGNOSIS — F259 Schizoaffective disorder, unspecified: Secondary | ICD-10-CM | POA: Diagnosis present

## 2016-06-13 DIAGNOSIS — Z8249 Family history of ischemic heart disease and other diseases of the circulatory system: Secondary | ICD-10-CM | POA: Diagnosis not present

## 2016-06-13 DIAGNOSIS — E440 Moderate protein-calorie malnutrition: Secondary | ICD-10-CM | POA: Diagnosis present

## 2016-06-13 DIAGNOSIS — N179 Acute kidney failure, unspecified: Secondary | ICD-10-CM

## 2016-06-13 DIAGNOSIS — Z6823 Body mass index (BMI) 23.0-23.9, adult: Secondary | ICD-10-CM | POA: Diagnosis not present

## 2016-06-13 DIAGNOSIS — G9340 Encephalopathy, unspecified: Secondary | ICD-10-CM | POA: Diagnosis not present

## 2016-06-13 DIAGNOSIS — G9341 Metabolic encephalopathy: Secondary | ICD-10-CM | POA: Diagnosis present

## 2016-06-13 DIAGNOSIS — Z7951 Long term (current) use of inhaled steroids: Secondary | ICD-10-CM | POA: Diagnosis not present

## 2016-06-13 DIAGNOSIS — F17210 Nicotine dependence, cigarettes, uncomplicated: Secondary | ICD-10-CM | POA: Diagnosis present

## 2016-06-13 DIAGNOSIS — F329 Major depressive disorder, single episode, unspecified: Secondary | ICD-10-CM | POA: Diagnosis present

## 2016-06-13 DIAGNOSIS — Z7982 Long term (current) use of aspirin: Secondary | ICD-10-CM | POA: Diagnosis not present

## 2016-06-13 DIAGNOSIS — E860 Dehydration: Secondary | ICD-10-CM | POA: Diagnosis present

## 2016-06-13 DIAGNOSIS — N390 Urinary tract infection, site not specified: Secondary | ICD-10-CM | POA: Diagnosis present

## 2016-06-13 DIAGNOSIS — I452 Bifascicular block: Secondary | ICD-10-CM | POA: Diagnosis present

## 2016-06-13 DIAGNOSIS — F419 Anxiety disorder, unspecified: Secondary | ICD-10-CM | POA: Diagnosis present

## 2016-06-13 DIAGNOSIS — G4700 Insomnia, unspecified: Secondary | ICD-10-CM | POA: Diagnosis present

## 2016-06-13 DIAGNOSIS — I10 Essential (primary) hypertension: Secondary | ICD-10-CM

## 2016-06-13 DIAGNOSIS — J690 Pneumonitis due to inhalation of food and vomit: Secondary | ICD-10-CM | POA: Diagnosis present

## 2016-06-13 DIAGNOSIS — E872 Acidosis: Secondary | ICD-10-CM | POA: Diagnosis present

## 2016-06-13 DIAGNOSIS — R4182 Altered mental status, unspecified: Secondary | ICD-10-CM | POA: Diagnosis present

## 2016-06-13 DIAGNOSIS — Z823 Family history of stroke: Secondary | ICD-10-CM | POA: Diagnosis not present

## 2016-06-13 LAB — CBC
HCT: 36.8 % (ref 36.0–46.0)
Hemoglobin: 12.1 g/dL (ref 12.0–15.0)
MCH: 29.2 pg (ref 26.0–34.0)
MCHC: 32.9 g/dL (ref 30.0–36.0)
MCV: 88.9 fL (ref 78.0–100.0)
PLATELETS: 401 10*3/uL — AB (ref 150–400)
RBC: 4.14 MIL/uL (ref 3.87–5.11)
RDW: 13.7 % (ref 11.5–15.5)
WBC: 14.3 10*3/uL — AB (ref 4.0–10.5)

## 2016-06-13 LAB — BASIC METABOLIC PANEL
Anion gap: 12 (ref 5–15)
BUN: 12 mg/dL (ref 6–20)
CHLORIDE: 110 mmol/L (ref 101–111)
CO2: 22 mmol/L (ref 22–32)
CREATININE: 1.12 mg/dL — AB (ref 0.44–1.00)
Calcium: 11.2 mg/dL — ABNORMAL HIGH (ref 8.9–10.3)
GFR calc Af Amer: 58 mL/min — ABNORMAL LOW (ref >60)
GFR calc non Af Amer: 50 mL/min — ABNORMAL LOW (ref >60)
GLUCOSE: 108 mg/dL — AB (ref 65–99)
Potassium: 3.5 mmol/L (ref 3.5–5.1)
SODIUM: 144 mmol/L (ref 135–145)

## 2016-06-13 LAB — SODIUM, URINE, RANDOM: Sodium, Ur: 51 mmol/L

## 2016-06-13 LAB — URINE MICROSCOPIC-ADD ON: RBC / HPF: NONE SEEN RBC/hpf (ref 0–5)

## 2016-06-13 LAB — HEPATIC FUNCTION PANEL
ALK PHOS: 124 U/L (ref 38–126)
ALT: 26 U/L (ref 14–54)
AST: 35 U/L (ref 15–41)
Albumin: 3.5 g/dL (ref 3.5–5.0)
BILIRUBIN INDIRECT: 0.9 mg/dL (ref 0.3–0.9)
BILIRUBIN TOTAL: 1.1 mg/dL (ref 0.3–1.2)
Bilirubin, Direct: 0.2 mg/dL (ref 0.1–0.5)
Total Protein: 7.3 g/dL (ref 6.5–8.1)

## 2016-06-13 LAB — RESPIRATORY PANEL BY PCR
Adenovirus: NOT DETECTED
BORDETELLA PERTUSSIS-RVPCR: NOT DETECTED
CHLAMYDOPHILA PNEUMONIAE-RVPPCR: NOT DETECTED
CORONAVIRUS 229E-RVPPCR: NOT DETECTED
CORONAVIRUS HKU1-RVPPCR: NOT DETECTED
Coronavirus NL63: NOT DETECTED
Coronavirus OC43: NOT DETECTED
INFLUENZA B-RVPPCR: NOT DETECTED
Influenza A: NOT DETECTED
Metapneumovirus: NOT DETECTED
Mycoplasma pneumoniae: NOT DETECTED
Parainfluenza Virus 1: NOT DETECTED
Parainfluenza Virus 2: NOT DETECTED
Parainfluenza Virus 3: NOT DETECTED
Parainfluenza Virus 4: NOT DETECTED
Respiratory Syncytial Virus: NOT DETECTED
Rhinovirus / Enterovirus: NOT DETECTED

## 2016-06-13 LAB — I-STAT ARTERIAL BLOOD GAS, ED
ACID-BASE DEFICIT: 5 mmol/L — AB (ref 0.0–2.0)
BICARBONATE: 19.2 mmol/L — AB (ref 20.0–28.0)
O2 Saturation: 93 %
PO2 ART: 67 mmHg — AB (ref 83.0–108.0)
TCO2: 20 mmol/L (ref 0–100)
pCO2 arterial: 32.7 mmHg (ref 32.0–48.0)
pH, Arterial: 7.377 (ref 7.350–7.450)

## 2016-06-13 LAB — CREATININE, URINE, RANDOM: Creatinine, Urine: 146.36 mg/dL

## 2016-06-13 LAB — MRSA PCR SCREENING: MRSA by PCR: POSITIVE — AB

## 2016-06-13 LAB — LITHIUM LEVEL: Lithium Lvl: 1.03 mmol/L (ref 0.60–1.20)

## 2016-06-13 LAB — URINALYSIS, ROUTINE W REFLEX MICROSCOPIC
GLUCOSE, UA: NEGATIVE mg/dL
HGB URINE DIPSTICK: NEGATIVE
KETONES UR: 15 mg/dL — AB
Nitrite: NEGATIVE
PH: 6.5 (ref 5.0–8.0)
Protein, ur: NEGATIVE mg/dL
Specific Gravity, Urine: 1.014 (ref 1.005–1.030)

## 2016-06-13 LAB — CBG MONITORING, ED
GLUCOSE-CAPILLARY: 110 mg/dL — AB (ref 65–99)
GLUCOSE-CAPILLARY: 90 mg/dL (ref 65–99)

## 2016-06-13 LAB — LACTIC ACID, PLASMA: Lactic Acid, Venous: 0.8 mmol/L (ref 0.5–1.9)

## 2016-06-13 LAB — PROCALCITONIN: Procalcitonin: <0.1 ng/mL

## 2016-06-13 MED ORDER — ONDANSETRON HCL 4 MG/2ML IJ SOLN: 4.0000 mg | Freq: Four times a day (QID) | INTRAMUSCULAR | Status: DC | PRN

## 2016-06-13 MED ORDER — ASPIRIN 300 MG RE SUPP
300.0000 mg | Freq: Every day | RECTAL | Status: DC
  Administered 2016-06-14 – 2016-06-17 (×4): 300 mg via RECTAL
  Filled 2016-06-13 (×4): qty 1

## 2016-06-13 MED ORDER — FLUTICASONE PROPIONATE 50 MCG/ACT NA SUSP
1.0000 | Freq: Two times a day (BID) | NASAL | Status: DC
  Administered 2016-06-13 – 2016-06-18 (×10): 1 via NASAL
  Filled 2016-06-13 (×2): qty 16

## 2016-06-13 MED ORDER — SODIUM CHLORIDE 0.9 % IV BOLUS (SEPSIS)
500.0000 mL | Freq: Once | INTRAVENOUS | Status: AC
  Administered 2016-06-13: 500 mL via INTRAVENOUS

## 2016-06-13 MED ORDER — ENOXAPARIN SODIUM 40 MG/0.4ML ~~LOC~~ SOLN
40.0000 mg | Freq: Every day | SUBCUTANEOUS | Status: DC
  Administered 2016-06-13 – 2016-06-18 (×6): 40 mg via SUBCUTANEOUS
  Filled 2016-06-13 (×7): qty 0.4

## 2016-06-13 MED ORDER — SODIUM CHLORIDE 0.9 % IV BOLUS (SEPSIS)
1000.0000 mL | Freq: Once | INTRAVENOUS | Status: AC
  Administered 2016-06-13: 1000 mL via INTRAVENOUS

## 2016-06-13 MED ORDER — HYDRALAZINE HCL 20 MG/ML IJ SOLN: 5.0000 mg | INTRAMUSCULAR | Status: DC | PRN

## 2016-06-13 MED ORDER — POLYVINYL ALCOHOL 1.4 % OP SOLN
1.0000 [drp] | Freq: Two times a day (BID) | OPHTHALMIC | Status: DC
  Administered 2016-06-13 – 2016-06-18 (×10): 1 [drp] via OPHTHALMIC
  Filled 2016-06-13 (×2): qty 15

## 2016-06-13 MED ORDER — MELATONIN 3 MG PO TABS
3.0000 mg | ORAL_TABLET | Freq: Every day | ORAL | Status: DC
  Administered 2016-06-14 – 2016-06-17 (×4): 3 mg via ORAL
  Filled 2016-06-13 (×6): qty 1

## 2016-06-13 MED ORDER — LITHIUM CARBONATE 300 MG PO CAPS
300.0000 mg | ORAL_CAPSULE | Freq: Two times a day (BID) | ORAL | Status: DC
  Administered 2016-06-14 – 2016-06-18 (×9): 300 mg via ORAL
  Filled 2016-06-13 (×11): qty 1

## 2016-06-13 MED ORDER — DEXTROSE 5 % IV SOLN
1.0000 g | Freq: Once | INTRAVENOUS | Status: AC
  Administered 2016-06-13: 1 g via INTRAVENOUS
  Filled 2016-06-13: qty 10

## 2016-06-13 MED ORDER — FLUPHENAZINE HCL 10 MG PO TABS
10.0000 mg | ORAL_TABLET | Freq: Two times a day (BID) | ORAL | Status: DC
  Administered 2016-06-14 – 2016-06-18 (×9): 10 mg via ORAL
  Filled 2016-06-13 (×12): qty 1

## 2016-06-13 MED ORDER — DEXTROSE 5 % IV SOLN
1.0000 g | Freq: Three times a day (TID) | INTRAVENOUS | Status: DC
  Administered 2016-06-13 – 2016-06-16 (×8): 1 g via INTRAVENOUS
  Filled 2016-06-13 (×10): qty 1

## 2016-06-13 MED ORDER — SODIUM CHLORIDE 0.9 % IV SOLN: INTRAVENOUS | Status: DC

## 2016-06-13 MED ORDER — DEXTROSE 5 % IV SOLN
2.0000 g | Freq: Once | INTRAVENOUS | Status: AC
  Administered 2016-06-13: 2 g via INTRAVENOUS
  Filled 2016-06-13: qty 2

## 2016-06-13 MED ORDER — HYDROXYZINE HCL 25 MG PO TABS: 100.0000 mg | ORAL_TABLET | Freq: Two times a day (BID) | ORAL | Status: DC

## 2016-06-13 MED ORDER — ONDANSETRON HCL 4 MG PO TABS: 4.0000 mg | ORAL_TABLET | Freq: Four times a day (QID) | ORAL | Status: DC | PRN

## 2016-06-13 MED ORDER — CRANBERRY 500 MG PO TABS: 1.0000 | ORAL_TABLET | Freq: Two times a day (BID) | ORAL | Status: DC

## 2016-06-13 MED ORDER — SERTRALINE HCL 50 MG PO TABS
150.0000 mg | ORAL_TABLET | Freq: Every day | ORAL | Status: DC
  Administered 2016-06-14 – 2016-06-18 (×5): 150 mg via ORAL
  Filled 2016-06-13 (×5): qty 1

## 2016-06-13 MED ORDER — ACETAMINOPHEN 325 MG PO TABS
650.0000 mg | ORAL_TABLET | Freq: Four times a day (QID) | ORAL | Status: DC | PRN
  Administered 2016-06-17: 650 mg via ORAL
  Filled 2016-06-13: qty 2

## 2016-06-13 MED ORDER — RISPERIDONE 3 MG PO TABS
3.0000 mg | ORAL_TABLET | Freq: Two times a day (BID) | ORAL | Status: DC
  Filled 2016-06-13: qty 1

## 2016-06-13 MED ORDER — ORAL CARE MOUTH RINSE
15.0000 mL | Freq: Two times a day (BID) | OROMUCOSAL | Status: DC
  Administered 2016-06-14 – 2016-06-18 (×9): 15 mL via OROMUCOSAL

## 2016-06-13 MED ORDER — ASPIRIN EC 81 MG PO TBEC: 81.0000 mg | DELAYED_RELEASE_TABLET | Freq: Every day | ORAL | Status: DC

## 2016-06-13 MED ORDER — BENZOCAINE-RESORCINOL 5-2 % VA CREA: 1.0000 "application " | TOPICAL_CREAM | VAGINAL | Status: DC | PRN

## 2016-06-13 MED ORDER — VANCOMYCIN HCL IN DEXTROSE 750-5 MG/150ML-% IV SOLN
750.0000 mg | Freq: Two times a day (BID) | INTRAVENOUS | Status: DC
  Administered 2016-06-13 – 2016-06-15 (×5): 750 mg via INTRAVENOUS
  Filled 2016-06-13 (×8): qty 150

## 2016-06-13 MED ORDER — HALOPERIDOL LACTATE 5 MG/ML IJ SOLN: 2.0000 mg | Freq: Four times a day (QID) | INTRAMUSCULAR | Status: DC | PRN

## 2016-06-13 MED ORDER — BENZTROPINE MESYLATE 1 MG PO TABS
1.0000 mg | ORAL_TABLET | Freq: Two times a day (BID) | ORAL | Status: DC
  Administered 2016-06-14 – 2016-06-18 (×9): 1 mg via ORAL
  Filled 2016-06-13 (×10): qty 1

## 2016-06-13 MED ORDER — VITAMIN D 1000 UNITS PO TABS
1000.0000 [IU] | ORAL_TABLET | Freq: Every day | ORAL | Status: DC
  Administered 2016-06-14 – 2016-06-18 (×5): 1000 [IU] via ORAL
  Filled 2016-06-13 (×5): qty 1

## 2016-06-13 MED ORDER — SODIUM CHLORIDE 0.9 % IV SOLN
INTRAVENOUS | Status: DC
  Administered 2016-06-13 – 2016-06-15 (×4): via INTRAVENOUS
  Filled 2016-06-13 (×8): qty 1000

## 2016-06-13 MED ORDER — CHLORHEXIDINE GLUCONATE CLOTH 2 % EX PADS
6.0000 | MEDICATED_PAD | Freq: Every day | CUTANEOUS | Status: DC
  Administered 2016-06-14 – 2016-06-18 (×4): 6 via TOPICAL

## 2016-06-13 MED ORDER — GUAIFENESIN 100 MG/5ML PO SYRP
200.0000 mg | ORAL_SOLUTION | Freq: Four times a day (QID) | ORAL | Status: DC | PRN
  Filled 2016-06-13: qty 10

## 2016-06-13 MED ORDER — MUPIROCIN 2 % EX OINT
TOPICAL_OINTMENT | Freq: Two times a day (BID) | CUTANEOUS | Status: DC
  Administered 2016-06-13 – 2016-06-18 (×10): via NASAL
  Filled 2016-06-13 (×3): qty 22

## 2016-06-13 MED ORDER — ACETAMINOPHEN 650 MG RE SUPP: 650.0000 mg | Freq: Four times a day (QID) | RECTAL | Status: DC | PRN

## 2016-06-13 MED ORDER — WHITE PETROLATUM GEL
Status: AC
  Administered 2016-06-13: 1
  Filled 2016-06-13: qty 1

## 2016-06-13 MED ORDER — VANCOMYCIN HCL 10 G IV SOLR
1500.0000 mg | Freq: Once | INTRAVENOUS | Status: AC
  Administered 2016-06-13: 1500 mg via INTRAVENOUS
  Filled 2016-06-13: qty 1500

## 2016-06-13 MED ORDER — CYCLOSPORINE 0.05 % OP EMUL
1.0000 [drp] | Freq: Two times a day (BID) | OPHTHALMIC | Status: DC
  Administered 2016-06-13 – 2016-06-18 (×10): 1 [drp] via OPHTHALMIC
  Filled 2016-06-13 (×12): qty 1

## 2016-06-13 MED ORDER — CEFTRIAXONE SODIUM 1 G IJ SOLR
1.0000 g | INTRAMUSCULAR | Status: DC
Start: 2016-06-13 — End: 2016-06-13

## 2016-06-13 MED ORDER — SODIUM CHLORIDE 0.9% FLUSH
3.0000 mL | Freq: Two times a day (BID) | INTRAVENOUS | Status: DC
  Administered 2016-06-13 – 2016-06-18 (×9): 3 mL via INTRAVENOUS

## 2016-06-13 NOTE — Progress Notes (Signed)
Pharmacy Antibiotic Note  Heidi Blevins is a 65 y.o. female admitted on 06/12/2016 with pneumonia and UTI.  Pharmacy has been consulted for vancomycin and cefepime dosing. Pt is afebrile but WBC is elevated at 14.3. Scr is 1.12. Recent urine culture with enterococcus.   Plan: - Vancomycin 1500mg  IV x 1 then 750mg  IV Q12H - Cefepime 2gm IV x 1 then 1gm IV Q8H - F/u renal fxn, C&S, clinical status and trough at SS    Temp (24hrs), Avg:98.3 F (36.8 C), Min:98.2 F (36.8 C), Max:98.4 F (36.9 C)   Recent Labs Lab 06/06/16 1650 06/12/16 2242 06/13/16 0310  WBC 15.0* 13.1* 14.3*  CREATININE 0.92 1.23* 1.12*  LATICACIDVEN  --   --  0.8    CrCl cannot be calculated (Unknown ideal weight.).    Allergies  Allergen Reactions  . Sulfonamide Derivatives Hives         Antimicrobials this admission: Vanc 10/17>> Cefepime 10/17>> CTX x 1 10/17  Dose adjustments this admission: N/A  Microbiology results: 10/10 Urine - enterococcus  Thank you for allowing pharmacy to be a part of this patient's care.  Hikaru Delorenzo, Rande Lawman 06/13/2016 8:58 AM

## 2016-06-13 NOTE — ED Notes (Signed)
Pt squirming in bed and bending left arm where iv is in left ac, it has not infused all the way again her son left to get some sleep states that he  Or his brother will be back to see her

## 2016-06-13 NOTE — Progress Notes (Signed)
PROGRESS NOTE  Heidi Blevins [id number] DOB: 01/18/1951 DOA: 06/12/2016 PCP: [name], MD  Brief History:  65 y.o. female with medical history significant of hypertension, depression, anxiety, schizoaffective disorder, hypercalcemia, bifasicular block, tobacco abuse, stroke, who presents with AMS. The patient's son stated that the patient had some lethargy and malaise beginning 06/07/2016. At that time, her facility made a decision to start treating the patient empirically for UTI and start the patient on cephalexin. Unfortunately, the patient's mental status continued to decline to the point where she was unable to get up to go to the dining room to eat at her assisted living facility, [facility].  As a result, the patient's son states that she has had minimal oral intake for the past 5 days.  There's been some complaints of nausea without any emesis. There is been no fevers, chills, chest pain, shortness breath, vomiting, diarrhea, abdominal pain. Notably, the patient was admitted to the hospital from 05/03/2016 through 05/06/2016. At that time, the patient was treated for UTI.  Enterococcus faecium grew from culture resistant to ampicillin.  The patient was discharge with Augmentin. Upon presentation, the patient was noted to have WBC 14.3, serum creatinine 1.23. Chest x-ray showed peribronchial thickening with slight increase in left lower lobe atelectasis.  Assessment/Plan: Acute metabolic encephalopathy -Multifactorial including UTI, dehydration, and possible aspiration pneumonitis -Continue IV fluids -Given history of Enterococcus faecium from culture--start empiric vancomycin pending culture data -Patient had extensive workup during her last admission including MRI brain negative for acute findings -Serum B12, TSH, RPR, ammonia, lithium level--all unremarkable previously  Pyuria -likely UTI -vancomycin as discussed above -d/c ceftriaxone -06/12/16 UA--TNTC  WBC  LLL opacity -CXR--personally reviewed--appears to have slight increase LLL opacity vs 06/06/16 CXR -concerned about aspiration pneumonitis in setting of decreased mentation x 5 days -speech therapy eval when pt is more awake -d/c ceftriaxone -start cefepime--tx as HCAP/aspiration -procalcitonin -viral respiratory panel  AKI -baseline creatinine 0.7-0.9 -presenting creatinine 1.23 -secondary to dehydration -improving with IVF  Hypercalcemia -Secondary to lithium -Review of the medical record reveals that the patient has had hypercalcemia chronically which likely due to the patient's chronic lithium use -Certainly, the patient's immobility may contribute to slight worsening -Chronic calcium 11.6 -Intact PTH -Continue IV fluids  Schizoaffective disorder -Continue home medications including Cogentin, Prolixin, lithium, Risperdal -d/c vistaril for now -may need to d/c other meds if no improvement in mental status -continue zoloft    Disposition Plan:   ALF vs SNF 2-3 days  Family Communication:   Son updated at bedside--Total time spent 40 minutes.  Greater than 50% spent face to face counseling and coordinating care.  0750 to 0830   Consultants:  none  Code Status:  FULL--confirmed with son  DVT Prophylaxis:  [place] Lovenox   Procedures: As Listed in Progress Note Above  Antibiotics: None    Subjective: Patient is encephalopathic. She awakens to examination. She intermittently answers yes or no questions. Denies any chest pain, short of breath. Otherwise review of systems unobtainable secondary to patient's encephalopathy.  Objective: Vitals:   06/13/16 0545 06/13/16 0630 06/13/16 0645 06/13/16 0700  BP: 146/78 124/98 131/79 116/70  Pulse: 84 67 83 84  Resp: 16 17 19 18   Temp:      TempSrc:      SpO2: 97% 98% 100% 99%    Intake/Output Summary (Last 24 hours) at 06/13/16 0810 Last data filed at 06/13/16 0245  Gross per 24 hour  Intake             2050  ml  Output                0 ml  Net             2050 ml   Weight change:  Exam:   General:  Pt is alert, follows commands appropriately, not in acute distress  HEENT: No icterus, No thrush, No neck mass, [place]/AT; no meningismus  Cardiovascular: RRR, S1/S2, no rubs, no gallops  Respiratory: Diminished breath sounds. Right basal crackles. No wheeze.  Abdomen: Soft/+BS, non tender, non distended, no guarding  Extremities: No edema, No lymphangitis, No petechiae, No rashes, no synovitis   Data Reviewed: I have personally reviewed following labs and imaging studies Basic Metabolic Panel:  Recent Labs Lab 06/06/16 1650 06/12/16 2242 06/13/16 0310  NA 140 140 144  K 4.0 3.7 3.5  CL 110 110 110  CO2 22 20* 22  GLUCOSE 111* 100* 108*  BUN 17 13 12   CREATININE 0.92 1.23* 1.12*  CALCIUM 10.6* 11.3* 11.2*   Liver Function Tests:  Recent Labs Lab 06/06/16 1650 06/12/16 2327  AST 22 35  ALT 19 26  ALKPHOS 133* 124  BILITOT 0.8 1.1  PROT 7.2 7.3  ALBUMIN 3.8 3.5    Recent Labs Lab 06/06/16 1650  LIPASE 19   No results for input(s): AMMONIA in the last 168 hours. Coagulation Profile: No results for input(s): INR, PROTIME in the last 168 hours. CBC:  Recent Labs Lab 06/06/16 1650 06/12/16 2242 06/13/16 0310  WBC 15.0* 13.1* 14.3*  NEUTROABS 11.3* 9.8*  --   HGB 11.5* 12.1 12.1  HCT 34.7* 36.1 36.8  MCV 88.5 88.9 88.9  PLT 393 323 401*   Cardiac Enzymes: No results for input(s): CKTOTAL, CKMB, CKMBINDEX, TROPONINI in the last 168 hours. BNP: Invalid input(s): POCBNP CBG: No results for input(s): GLUCAP in the last 168 hours. HbA1C: No results for input(s): HGBA1C in the last 72 hours. Urine analysis:    Component Value Date/Time   COLORURINE YELLOW 06/13/2016 0102   APPEARANCEUR CLOUDY (A) 06/13/2016 0102   LABSPEC 1.014 06/13/2016 0102   PHURINE 6.5 06/13/2016 0102   GLUCOSEU NEGATIVE 06/13/2016 0102   HGBUR NEGATIVE 06/13/2016 0102    BILIRUBINUR MODERATE (A) 06/13/2016 0102   KETONESUR 15 (A) 06/13/2016 0102   PROTEINUR NEGATIVE 06/13/2016 0102   UROBILINOGEN 1.0 08/05/2011 1751   NITRITE NEGATIVE 06/13/2016 0102   LEUKOCYTESUR LARGE (A) 06/13/2016 0102   Sepsis Labs: @LABRCNTIP (procalcitonin:4,lacticidven:4) ) Recent Results (from the past 240 hour(s))  Urine culture     Status: Abnormal   Collection Time: 06/06/16  4:30 PM  Result Value Ref Range Status   Specimen Description URINE, RANDOM  Final   Special Requests NONE  Final   Culture >=100,000 COLONIES/mL ENTEROCOCCUS FAECIUM (A)  Final   Report Status 06/08/2016 FINAL  Final   Organism ID, Bacteria ENTEROCOCCUS FAECIUM (A)  Final      Susceptibility   Enterococcus faecium - MIC*    AMPICILLIN >=32 RESISTANT Resistant     LEVOFLOXACIN >=8 RESISTANT Resistant     NITROFURANTOIN 256 RESISTANT Resistant     VANCOMYCIN <=0.5 SENSITIVE Sensitive     LINEZOLID 2 SENSITIVE Sensitive     * >=100,000 COLONIES/mL ENTEROCOCCUS FAECIUM     Scheduled Meds: . aspirin EC  81 mg Oral Daily  . benztropine  1 mg Oral BID  . cholecalciferol  1,000 Units Oral  Daily  . cycloSPORINE  1 drop Both Eyes BID  . enoxaparin (LOVENOX) injection  40 mg Subcutaneous Daily  . fluPHENAZine  10 mg Oral BID  . fluticasone  1 spray Each Nare BID  . hydrOXYzine  100 mg Oral BID  . lithium carbonate  300 mg Oral BID WC  . Melatonin  3 mg Oral QHS  . polyvinyl alcohol  1 drop Both Eyes BID  . risperiDONE  3 mg Oral BID  . sertraline  150 mg Oral Daily  . sodium chloride flush  3 mL Intravenous Q12H   Continuous Infusions: . cefTRIAXone (ROCEPHIN)  IV      Procedures/Studies: Dg Chest 2 View  Result Date: 06/12/2016 CLINICAL DATA:  Initial evaluation for acute altered mental status. The EXAM: CHEST  2 VIEW COMPARISON:  Prior radiograph from 06/06/2016. FINDINGS: Study somewhat limited by overlying arm on lateral projection. Cardiomegaly is stable.  Mediastinal silhouette  unchanged. Lungs hypoinflated. Mild scattered peribronchial thickening. No focal infiltrates. No overt pulmonary edema. No pleural effusion. No pneumothorax. Scoliosis with Harrington rod fixation noted. No acute osseous abnormality. Osteopenia noted. IMPRESSION: 1. Mild scattered peribronchial thickening, nonspecific, but could reflect sequela of acute bronchiolitis in the correct clinical setting. Mild pulmonary interstitial congestion could also have this appearance. 2. Stable cardiomegaly. Electronically Signed   By: Jeannine Boga M.D.   On: 06/12/2016 23:41   Dg Chest 2 View  Result Date: 06/06/2016 CLINICAL DATA:  Weakness EXAM: CHEST  2 VIEW COMPARISON:  05/03/2016 FINDINGS: Cardiomediastinal silhouette is stable. Dextroscoliosis thoracic spine again noted. Metallic rod in thoracic spine is stable. Limited study by poor inspiration. Streaky right basilar atelectasis or infiltrate. No pulmonary edema. IMPRESSION: Dextroscoliosis thoracic spine again noted. Metallic rod in thoracic spine is stable. Limited study by poor inspiration. Streaky right basilar atelectasis or infiltrate. No pulmonary edema. Electronically Signed   By: Lahoma Crocker M.D.   On: 06/06/2016 17:09   Dg Lumbar Spine Complete  Result Date: 05/16/2016 CLINICAL DATA:  Low back pain radiating to the left leg. Fell at home several weeks ago. EXAM: LUMBAR SPINE - COMPLETE 4+ VIEW COMPARISON:  None. FINDINGS: Chronic thoracolumbar curvature convex to the left. Chronic spinal fixation with visualization of a distal Harrington ride with a laminar hip on the right at the L2-3 level. Disc degeneration at L2-3, L3-4, L4-5 and L5-S1. Some deformity of the L2 and L3 vertebral bodies that are presumably chronic. There is no finding that I can identify as being acute. Osteoarthritis of the left hip is noted. 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 Spinal curvature convex to the left.  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 No acute/ traumatic finding. Electronically Signed   By: Nelson Chimes M.D.   On: 05/16/2016 15:06   Ct Head Wo Contrast  Result Date: 06/13/2016 CLINICAL DATA:  Acute onset of altered mental status. Initial encounter. EXAM: CT HEAD WITHOUT CONTRAST TECHNIQUE: Contiguous axial images were obtained from the base of the skull through the vertex without intravenous contrast. COMPARISON:  CT of the head performed 05/03/2016, and MRI of the brain performed 05/04/2016 FINDINGS: Brain: No evidence of acute infarction, hemorrhage, hydrocephalus, extra-axial collection or mass lesion/mass effect. Mild prominence of the ventricles and sulci suggests mild cortical volume loss. Mild periventricular and subcortical white matter change likely reflects small vessel ischemic microangiopathy. A chronic lacunar infarct is noted at the right basal ganglia. The brainstem and fourth  ventricle are within normal limits. The cerebral hemispheres demonstrate grossly normal gray-white differentiation. No mass effect or midline shift is seen. Vascular: No hyperdense vessel or unexpected calcification. Skull: There is no evidence of fracture; visualized osseous structures are unremarkable in appearance. Sinuses/Orbits: The visualized portions of the orbits are within normal limits. The paranasal sinuses and mastoid air cells are well-aerated. Other: No significant soft tissue abnormalities are seen. IMPRESSION: 1. No acute intracranial pathology seen on CT. 2. Mild cortical volume loss and scattered small vessel ischemic microangiopathy. 3. Chronic lacunar infarct at the right basal ganglia. Electronically Signed   By: Garald Balding M.D.   On: 06/13/2016 00:13    Elana Jian, DO  [facility] Pager [phone]  If 7PM-7AM, please contact night-coverage [email or web address] Password [email or web address] 06/13/2016, 8:10 AM   LOS: 0 days

## 2016-06-13 NOTE — ED Notes (Signed)
Report called to [facility] pt is 65 year old black female here for alt loc, pt dx with uti, failed stroke swallow x 2 dr made aware,  Family back now, had 2 ivs rt wrist and left ac both patent

## 2016-06-13 NOTE — ED Notes (Signed)
CBG 110. 

## 2016-06-13 NOTE — Progress Notes (Signed)
Initial Nutrition Assessment  DOCUMENTATION CODES:   Not applicable  INTERVENTION:   -RD will follow for diet advancement and supplement as appropriate -If pt continues to be unable to take PO safely, recommend short term nutrition support:  Initiate Jevity 1.2 @ 20 ml/hr and increase by 10 ml every 4 hours to goal rate of 60 ml/hr.   Tube feeding regimen provides 1728 kcal (100% of needs), 80 grams of protein, and 1162 ml of H2O.   NUTRITION DIAGNOSIS:   Inadequate oral intake related to inability to eat as evidenced by NPO status.  GOAL:   Patient will meet greater than or equal to 90% of their needs  MONITOR:   Diet advancement, Labs, Weight trends, Skin, I & O's  REASON FOR ASSESSMENT:   Consult Assessment of nutrition requirement/status  ASSESSMENT:   Heidi Blevins is a 65 y.o. female with medical history significant of hypertension, depression, anxiety, schizoaffective disorder, hypercalcemia, bifasicular block, tobacco abuse, stroke, who presents with AMS.  Pt admitted with AMS and UTI.   Pt is a resident of [facility], where she has been a resident for approximately 8 years. Unable to obtain hx from pt, due to AMS, however, pt son reports pt more alert during visit since admission. Pt was able to greet this RD and would respond in incoherent language when this RD spoke to her.   Hx obtained from pt's son Gerald Stabs) at bedside. He reports that pt has never been "a big eater", however, oral intake has declined over the past 8 days PTA related to decline in functional status. At baseline, pt is able to feed herself and walk with minimal assistance, however, pt unable to perform these tasks currently. Per pt son, pt has not had "a full meal in 8 days". He expressed concern over pt failing swallow screen. Pt did not have swallowing issues prior to this event and received a regular diet. Pt son also shares that pt was receiving "Great Shakes" PTA.   Pt son reveals  that pt's UBW is around 160#. He estimates pt has lost 15# over the past 2 weeks. However, no data is available to confirm this.   Nutrition-Focused physical exam completed. Findings are mild fat depletion, mild muscle depletion, and no edema. Suspect some degree of muscle depletion and weight loss may be related to dehydration. Unable to identify malnutrition at this time, however, pt is at high risk given hx and NPO status.   Labs reviewed: CBGS: 90-100.   Diet Order:  Diet NPO time specified Except for: Sips with Meds  Skin:  Reviewed, no issues  Last BM:  PTA  Height:   Ht Readings from Last 1 Encounters:  06/13/16 5\' 6"  (1.676 m)    Weight:   Wt Readings from Last 1 Encounters:  06/13/16 145 lb 11.2 oz (66.1 kg)    Ideal Body Weight:  59.1 kg  BMI:  Body mass index is 23.52 kg/m.  Estimated Nutritional Needs:   Kcal:  1600-1800  Protein:  75-90 grams  Fluid:  1.6-1.8 L  EDUCATION NEEDS:   No education needs identified at this time  Milanni Ayub A. Jimmye Norman, RD, LDN, CDE Pager: [phone] After hours Pager: [phone]

## 2016-06-13 NOTE — ED Notes (Signed)
Pt placed on bedpaN but did not pee , pt placed in adult diaper and pulled up in bed and made comfortable

## 2016-06-13 NOTE — Evaluation (Signed)
Clinical/Bedside Swallow Evaluation Patient Details  Name: Heidi Blevins MRN: [id number] Date of Birth: May 26, 1951  Today's Date: 06/13/2016 Time: SLP Start Time (ACUTE ONLY): 1555 SLP Stop Time (ACUTE ONLY): 1606 SLP Time Calculation (min) (ACUTE ONLY): 11 min  Past Medical History:  Past Medical History:  Diagnosis Date  . Anxiety   . Arrhythmia   . Bifascicular block   . Depression   . Hypertension   . Insomnia   . Schizoaffective disorder   . Scoliosis   . Vitamin D deficiency    Past Surgical History:  Past Surgical History:  Procedure Laterality Date  . BACK SURGERY     "as a child after she fell out of a tree"  . TONSILLECTOMY     HPI:  65 y.o.femalewith medical history significant of hypertension, depression, anxiety, schizoaffective disorder, hypercalcemia, bifasicular block, tobacco abuse, stroke, who presents with AMS. Admitted with pna and UTI. She failed the SSS. Head CT showed no acute intracranial pathology.   Assessment / Plan / Recommendation Clinical Impression  Pt was lethargic upon arrival and SLP repositioned her for PO trials. Oral motor exam was limited due to pt's reduced mentation; however, her vocal quality was normal. Pt consumed a single ice chip trial and poor bolus awareness, anterior loss, reduced mastication and lingual movements were all observed. SLP attempted to initiate oral movement by offering a drink via straw and despite maximal verbal cueing no labial seal occurred. Given mentation and bedside performance, no further PO trials were offered. Recommend pt remain NPO. Will f/u for PO readiness pending improved alertness and mentation.    Aspiration Risk  Moderate aspiration risk    Diet Recommendation NPO   Medication Administration: Via alternative means    Other  Recommendations Oral Care Recommendations: Oral care QID   Follow up Recommendations 24 hour supervision/assistance      Frequency and Duration min 2x/week  2  weeks       Prognosis Prognosis for Safe Diet Advancement: Good Barriers to Reach Goals: Cognitive deficits;Severity of deficits      Swallow Study   General HPI: 65 y.o.femalewith medical history significant of hypertension, depression, anxiety, schizoaffective disorder, hypercalcemia, bifasicular block, tobacco abuse, stroke, who presents with AMS. Admitted with pna and UTI. She failed the SSS. Head CT showed no acute intracranial pathology. Type of Study: Bedside Swallow Evaluation Previous Swallow Assessment: BSE on 05/04/16 Diet Prior to this Study: NPO Temperature Spikes Noted: No Respiratory Status: Room air History of Recent Intubation: No Behavior/Cognition: Requires cueing;Lethargic/Drowsy Oral Cavity Assessment: Dried secretions Oral Care Completed by SLP: No Oral Cavity - Dentition: Missing dentition Self-Feeding Abilities: Total assist Patient Positioning: Upright in bed Baseline Vocal Quality: Normal Volitional Cough: Cognitively unable to elicit    Oral/Motor/Sensory Function Overall Oral Motor/Sensory Function: Other (comment) (limited assessment due to cognition)   Ice Chips Ice chips: Impaired Presentation: Spoon Oral Phase Impairments: Reduced labial seal;Poor awareness of bolus;Impaired mastication;Reduced lingual movement/coordination Oral Phase Functional Implications: Other (comment) (anterior loss)   Thin Liquid Thin Liquid: Not tested    Nectar Thick Nectar Thick Liquid: Not tested   Honey Thick Honey Thick Liquid: Not tested   Puree Puree: Not tested   Solid   GO   Solid: Not tested       Ezekiel Slocumb, Student SLP  Shela Leff 06/13/2016,4:28 PM

## 2016-06-13 NOTE — H&P (Signed)
History and Physical    Heidi Blevins [id number] DOB: December 23, 1950 DOA: 06/12/2016  Referring MD/NP/PA:   PCP: [name], MD   Patient coming from:  The patient is coming from SNF.  At baseline, pt is dependent for most of ADL.   Chief Complaint: AMS  HPI: Heidi Blevins is a 65 y.o. female with medical history significant of hypertension, depression, anxiety, schizoaffective disorder, hypercalcemia, bifasicular block, tobacco abuse, stroke, who presents with AMS.  Per pt's sons, patient has not been able to eat well in the past 8 days.Ppatient is noted to be "not herself" by staff in SNF. Staff reports patient usually talkative, but not so now. She is more confused than her baseline. She is not actively coughing, nauseated. She does not seem to have abdominal pain, diarrhea, chest pain or shortness of breath. Not sure if she has any symptoms of UTI. She moves all extremities.  ED Course: pt was found to have posterior urinalysis with large amount of leukocytes, WBC 13.1, lactate 0.8, AKI with Cre 1.23, Ca 11.3, temperature normal, no tachycardia, no tachypnea, negative CT head for acute intracranial abnormalities. X-ray showed mild peirbronchial thickening. Pt is admitted to tele bed as inpt.  Review of Systems: Could not be reviewed due to altered mental status.  Allergy:  Allergies  Allergen Reactions  . Sulfonamide Derivatives Hives         Past Medical History:  Diagnosis Date  . Anxiety   . Arrhythmia   . Bifascicular block   . Depression   . Hypertension   . Insomnia   . Schizoaffective disorder   . Scoliosis   . Vitamin D deficiency     Past Surgical History:  Procedure Laterality Date  . BACK SURGERY     "as a child after she fell out of a tree"  . TONSILLECTOMY      Social History:  reports that she has been smoking Cigarettes.  She has a 10.00 pack-year smoking history. She has never used smokeless tobacco. She reports that she does not drink  alcohol or use drugs.  Family History:  Family History  Problem Relation Age of Onset  . CVA Mother   . Hypertension Mother   . Lupus Sister   . Hypertension Brother   . Peptic Ulcer Disease Father   . Colon cancer Neg Hx      Prior to Admission medications   Medication Sig Start Date End Date Taking? Authorizing Provider  aspirin EC 81 MG EC tablet Take 1 tablet (81 mg total) by mouth daily. 05/06/16  Yes Doreatha Lew, MD  benzocaine-resorcinol (VAGISIL) 5-2 % vaginal cream Place 1 application vaginally as needed for itching.   Yes Historical Provider, MD  benztropine (COGENTIN) 1 MG tablet Take 1 mg by mouth 2 (two) times daily.   Yes Historical Provider, MD  cephALEXin (KEFLEX) 500 MG capsule Take 1 capsule (500 mg total) by mouth 4 (four) times daily. 06/06/16  Yes Dorie Rank, MD  cholecalciferol (VITAMIN D) 400 units TABS tablet Take 400 Units by mouth daily.   Yes Historical Provider, MD  Cranberry 500 MG TABS Take 1 tablet by mouth 2 (two) times daily.    Yes Historical Provider, MD  cycloSPORINE (RESTASIS) 0.05 % ophthalmic emulsion Place 1 drop into both eyes 2 (two) times daily.   Yes Historical Provider, MD  fluPHENAZine (PROLIXIN) 10 MG tablet Take 10 mg by mouth 2 (two) times daily.   Yes Historical Provider, MD  fluticasone (  FLONASE) 50 MCG/ACT nasal spray Place 1 spray into both nostrils 2 (two) times daily.   Yes Historical Provider, MD  guaifenesin (ROBITUSSIN) 100 MG/5ML syrup Take 200 mg by mouth every 6 (six) hours as needed for cough.   Yes Historical Provider, MD  hydrOXYzine (ATARAX/VISTARIL) 50 MG tablet Take 100 mg by mouth 2 (two) times daily.    Yes Historical Provider, MD  ibuprofen (ADVIL,MOTRIN) 200 MG tablet Take 400 mg by mouth every 6 (six) hours as needed for moderate pain.    Yes Historical Provider, MD  lisinopril (PRINIVIL,ZESTRIL) 5 MG tablet Take 5 mg by mouth daily.   Yes Historical Provider, MD  lithium carbonate 300 MG capsule Take 300 mg by  mouth 2 (two) times daily with a meal.   Yes Historical Provider, MD  Melatonin 3 MG TABS Take 3 mg by mouth at bedtime.    Yes Historical Provider, MD  Nutritional Supplements (NUTRITIONAL SHAKE PLUS PO) Take 1 Container by mouth 3 (three) times daily.   Yes Historical Provider, MD  Propylene Glycol (SYSTANE BALANCE) 0.6 % SOLN Place 1 drop into both eyes 2 (two) times daily.   Yes Historical Provider, MD  risperiDONE (RISPERDAL) 3 MG tablet Take 3 mg by mouth 2 (two) times daily.   Yes Historical Provider, MD  sertraline (ZOLOFT) 100 MG tablet Take 150 mg by mouth daily.    Yes Historical Provider, MD  HYDROcodone-acetaminophen (NORCO/VICODIN) 5-325 MG tablet Take 1 tablet by mouth every 4 (four) hours as needed. Patient not taking: Reported on 06/12/2016 05/16/16   Larene Pickett, PA-C  metoprolol tartrate (LOPRESSOR) 25 MG tablet Take 1 tablet (25 mg total) by mouth 2 (two) times daily. Patient not taking: Reported on 06/12/2016 05/06/16   Doreatha Lew, MD  ondansetron (ZOFRAN ODT) 4 MG disintegrating tablet Take 1 tablet (4 mg total) by mouth every 8 (eight) hours as needed for nausea. Patient not taking: Reported on 06/12/2016 05/16/16   Larene Pickett, PA-C    Physical Exam: Vitals:   06/13/16 0545 06/13/16 0630 06/13/16 0645 06/13/16 0700  BP: 146/78 124/98 131/79 116/70  Pulse: 84 67 83 84  Resp: 16 17 19 18   Temp:      TempSrc:      SpO2: 97% 98% 100% 99%   General: Not in acute distress. Dry mucus and membrane HEENT:       Eyes: PERRL, EOMI, no scleral icterus.       ENT: No discharge from the ears and nose, no pharynx injection, no tonsillar enlargement.        Neck: No JVD, no bruit, no mass felt. Heme: No neck lymph node enlargement. Cardiac: S1/S2, RRR, No murmurs, No gallops or rubs. Respiratory: No rales, wheezing, rhonchi or rubs. GI: Soft, nondistended, nontender, no organomegaly, BS present. GU: No hematuria Ext: No pitting leg edema bilaterally. 2+DP/PT pulse  bilaterally. Musculoskeletal: No joint deformities, No joint redness or warmth, no limitation of ROM in spin. Skin: No rashes.  Neuro: confused, not oriented X3, cranial nerves II-XII grossly intact, moves all extremities. Psych: Patient is not psychotic, no suicidal or hemocidal ideation.  Labs on Admission: I have personally reviewed following labs and imaging studies  CBC:  Recent Labs Lab 06/06/16 1650 06/12/16 2242 06/13/16 0310  WBC 15.0* 13.1* 14.3*  NEUTROABS 11.3* 9.8*  --   HGB 11.5* 12.1 12.1  HCT 34.7* 36.1 36.8  MCV 88.5 88.9 88.9  PLT 393 323 [id number]*   Basic Metabolic Panel:  Recent Labs Lab 06/06/16 1650 06/12/16 2242 06/13/16 0310  NA 140 140 144  K 4.0 3.7 3.5  CL 110 110 110  CO2 22 20* 22  GLUCOSE 111* 100* 108*  BUN 17 13 12   CREATININE 0.92 1.23* 1.12*  CALCIUM 10.6* 11.3* 11.2*   GFR: CrCl cannot be calculated (Unknown ideal weight.). Liver Function Tests:  Recent Labs Lab 06/06/16 1650 06/12/16 2327  AST 22 35  ALT 19 26  ALKPHOS 133* 124  BILITOT 0.8 1.1  PROT 7.2 7.3  ALBUMIN 3.8 3.5    Recent Labs Lab 06/06/16 1650  LIPASE 19   No results for input(s): AMMONIA in the last 168 hours. Coagulation Profile: No results for input(s): INR, PROTIME in the last 168 hours. Cardiac Enzymes: No results for input(s): CKTOTAL, CKMB, CKMBINDEX, TROPONINI in the last 168 hours. BNP (last 3 results) No results for input(s): PROBNP in the last 8760 hours. HbA1C: No results for input(s): HGBA1C in the last 72 hours. CBG: No results for input(s): GLUCAP in the last 168 hours. Lipid Profile: No results for input(s): CHOL, HDL, LDLCALC, TRIG, CHOLHDL, LDLDIRECT in the last 72 hours. Thyroid Function Tests: No results for input(s): TSH, T4TOTAL, FREET4, T3FREE, THYROIDAB in the last 72 hours. Anemia Panel: No results for input(s): VITAMINB12, FOLATE, FERRITIN, TIBC, IRON, RETICCTPCT in the last 72 hours. Urine analysis:    Component Value  Date/Time   COLORURINE YELLOW 06/13/2016 0102   APPEARANCEUR CLOUDY (A) 06/13/2016 0102   LABSPEC 1.014 06/13/2016 0102   PHURINE 6.5 06/13/2016 0102   GLUCOSEU NEGATIVE 06/13/2016 0102   HGBUR NEGATIVE 06/13/2016 0102   BILIRUBINUR MODERATE (A) 06/13/2016 0102   KETONESUR 15 (A) 06/13/2016 0102   PROTEINUR NEGATIVE 06/13/2016 0102   UROBILINOGEN 1.0 08/05/2011 1751   NITRITE NEGATIVE 06/13/2016 0102   LEUKOCYTESUR LARGE (A) 06/13/2016 0102   Sepsis Labs: @LABRCNTIP (procalcitonin:4,lacticidven:4) ) Recent Results (from the past 240 hour(s))  Urine culture     Status: Abnormal   Collection Time: 06/06/16  4:30 PM  Result Value Ref Range Status   Specimen Description URINE, RANDOM  Final   Special Requests NONE  Final   Culture >=100,000 COLONIES/mL ENTEROCOCCUS FAECIUM (A)  Final   Report Status 06/08/2016 FINAL  Final   Organism ID, Bacteria ENTEROCOCCUS FAECIUM (A)  Final      Susceptibility   Enterococcus faecium - MIC*    AMPICILLIN >=32 RESISTANT Resistant     LEVOFLOXACIN >=8 RESISTANT Resistant     NITROFURANTOIN 256 RESISTANT Resistant     VANCOMYCIN <=0.5 SENSITIVE Sensitive     LINEZOLID 2 SENSITIVE Sensitive     * >=100,000 COLONIES/mL ENTEROCOCCUS FAECIUM     Radiological Exams on Admission: Dg Chest 2 View  Result Date: 06/12/2016 CLINICAL DATA:  Initial evaluation for acute altered mental status. The EXAM: CHEST  2 VIEW COMPARISON:  Prior radiograph from 06/06/2016. FINDINGS: Study somewhat limited by overlying arm on lateral projection. Cardiomegaly is stable.  Mediastinal silhouette unchanged. Lungs hypoinflated. Mild scattered peribronchial thickening. No focal infiltrates. No overt pulmonary edema. No pleural effusion. No pneumothorax. Scoliosis with Harrington rod fixation noted. No acute osseous abnormality. Osteopenia noted. IMPRESSION: 1. Mild scattered peribronchial thickening, nonspecific, but could reflect sequela of acute bronchiolitis in the correct  clinical setting. Mild pulmonary interstitial congestion could also have this appearance. 2. Stable cardiomegaly. Electronically Signed   By: Jeannine Boga M.D.   On: 06/12/2016 23:41   Ct Head Wo Contrast  Result Date: 06/13/2016 CLINICAL DATA:  Acute onset of altered mental status. Initial encounter. EXAM: CT HEAD WITHOUT CONTRAST TECHNIQUE: Contiguous axial images were obtained from the base of the skull through the vertex without intravenous contrast. COMPARISON:  CT of the head performed 05/03/2016, and MRI of the brain performed 05/04/2016 FINDINGS: Brain: No evidence of acute infarction, hemorrhage, hydrocephalus, extra-axial collection or mass lesion/mass effect. Mild prominence of the ventricles and sulci suggests mild cortical volume loss. Mild periventricular and subcortical white matter change likely reflects small vessel ischemic microangiopathy. A chronic lacunar infarct is noted at the right basal ganglia. The brainstem and fourth ventricle are within normal limits. The cerebral hemispheres demonstrate grossly normal gray-white differentiation. No mass effect or midline shift is seen. Vascular: No hyperdense vessel or unexpected calcification. Skull: There is no evidence of fracture; visualized osseous structures are unremarkable in appearance. Sinuses/Orbits: The visualized portions of the orbits are within normal limits. The paranasal sinuses and mastoid air cells are well-aerated. Other: No significant soft tissue abnormalities are seen. IMPRESSION: 1. No acute intracranial pathology seen on CT. 2. Mild cortical volume loss and scattered small vessel ischemic microangiopathy. 3. Chronic lacunar infarct at the right basal ganglia. Electronically Signed   By: Garald Balding M.D.   On: 06/13/2016 00:13     EKG:  Not done in ED, will get one.   Assessment/Plan Principal Problem:   UTI (urinary tract infection) Active Problems:   Hypercalcemia   Dehydration   Acute  encephalopathy   Cerebrovascular disease   Schizoaffective disorder (HCC)   Bifascicular block   AKI (acute kidney injury) ([place])   Essential hypertension   UTI (urinary tract infection): Patient has positive urinalysis, indicating possible UTI, which may partially explain her altered mental status. Pt is not septic. Lactic acid is normal. Hemodynamically stable.  - Admit to telemetry as inpt - Ceftriaxone by IV - Follow up results of urine and blood cx and amend antibiotic regimen if needed per sensitivity results  Dehydration: -- IVF: 2L of NS bolus in ED, followed by 100cc/h   Hypercalcemia: This seemed to be a chronic issues. Etiology is not clear. Patient had PTH of 63 on 05/04/16. May be related to dehydration. Lithium use may have also contributed partially -IVF as above -f/u by BMP  Acute encephalopathy: Likely multifactorial, including UTI, dehydration, electrolyte disturbance (Ca 11.3) and AKI.  -treat underlying issues as above -IVF -Frequent neuro check  Cerebrovascular disease:  -Continue ASA  Schizoaffective disorder and depression: The Lithium level I.03 which is okay. -continue home Cogentin, fluphenazine, risperidone, lithium and zoloft  AKI: Likely due to prerenal secondary to dehydration and continuation of ACEI, NSAIDs - IVF as above - Check FeNa - Follow up renal function by BMP - Hold lisinopril and ibuprofen  HTN: -hold lisinopril due to worsening renal function -IV hydralazine when necessary  Protein calorie malnutrition-moderate -Consult to nutrition   DVT ppx: SQ Lovenox Code Status: Full code Family Communication: Yes, patient's 2 sons at bed side Disposition Plan:  Anticipate discharge back to previous SNF  environment Consults called:  none Admission status: Inpatient/tele      Date of Service 06/13/2016    Ivor Costa [facility] Pager [phone]  If 7PM-7AM, please contact night-coverage [email or web address] Password  [email or web address] 06/13/2016, 7:40 AM

## 2016-06-13 NOTE — ED Notes (Signed)
POCT CBG resulted 90; Cindy, RN aware

## 2016-06-13 NOTE — Progress Notes (Signed)
PHARMACIST - PHYSICIAN ORDER COMMUNICATION  CONCERNING: P&T Medication Policy on Herbal Medications  DESCRIPTION:  This patient's order for:  Cranberry  has been noted.  This product(s) is classified as an "herbal" or natural product. Due to a lack of definitive safety studies or FDA approval, nonstandard manufacturing practices, plus the potential risk of unknown drug-drug interactions while on inpatient medications, the Pharmacy and Therapeutics Committee does not permit the use of "herbal" or natural products of this type within [facility].   ACTION TAKEN: The pharmacy department is unable to verify this order at this time and your patient has been informed of this safety policy. Please reevaluate patient's clinical condition at discharge and address if the herbal or natural product(s) should be resumed at that time.   

## 2016-06-13 NOTE — Progress Notes (Signed)
Called by ED RN--failed bedside swallow screen eval --will need speech therapy eval when more awake --I acknowledged that it is okay to hold cogentin, vit D, fluphenazine, lithium, melatonin, risperdal, zoloft for now. -change ASA to PR -continue IVF and IV abx  DTat

## 2016-06-13 NOTE — Progress Notes (Addendum)
Paged by RN requesting meds for patient acting up I went to evaluate patient.  Laying in bed awake but confused -pt is a little more alert than this am but remains confused and not following commands -she answered no when asked about cp or sob.  Remainder ROS unobtainable -pt at risk for delirium, normally takes risperdal 3 mg bid -haldol 2mg  IV q 6 hrs prn agitation  DTat

## 2016-06-13 NOTE — ED Notes (Signed)
Phlebotomy at bedside at this time.

## 2016-06-13 NOTE — ED Notes (Signed)
Pt failed stroke swallow screen again coild not follow commands to lick top and bottem lip, son in room and aware

## 2016-06-13 NOTE — ED Notes (Signed)
Pt given mouth care and face washed,  Son I n room

## 2016-06-13 NOTE — ED Notes (Signed)
Family at bedside. 

## 2016-06-13 NOTE — Progress Notes (Signed)
CRITICAL VALUE ALERT  Critical value received:  MRSA positive PCR  Date of notification:  06/13/2016  Time of notification:  16:15  Critical value read back:Yes.    Nurse who received alert:  Alphonsus Sias  MD notified (1st page):  Dr. Carles Collet   Time of first page:  16:16  MD notified (2nd page):  Time of second page:  Responding MD:  Dr. Carles Collet   Time MD responded:  16:18

## 2016-06-13 NOTE — Care Management Note (Signed)
Case Management Note  Patient Details  Name: HAYAT FERRONE MRN: [id number] Date of Birth: 03-Sep-1950  Subjective/Objective:                 Patient admitted through ED from [place] for AMS. Recently through ED on 06/06/16 for UTI and DC'd on Keflex.    Action/Plan:  CM will follow with CSW for discharge planning.   Expected Discharge Date:                  Expected Discharge Plan:  Assisted Living / Rest Home  In-House Referral:  Clinical Social Work  Discharge planning Services  CM Consult  Post Acute Care Choice:    Choice offered to:     DME Arranged:    DME Agency:     HH Arranged:    HH Agency:     Status of Service:  In process, will continue to follow  If discussed at Long Length of Stay Meetings, dates discussed:    Additional Comments:  Carles Collet, RN 06/13/2016, 2:36 PM

## 2016-06-13 NOTE — ED Notes (Signed)
Admitting dr made awre of pt failing stroke swallow screen x 2

## 2016-06-14 DIAGNOSIS — I679 Cerebrovascular disease, unspecified: Secondary | ICD-10-CM

## 2016-06-14 LAB — BASIC METABOLIC PANEL
ANION GAP: 8 (ref 5–15)
BUN: 11 mg/dL (ref 6–20)
CALCIUM: 11 mg/dL — AB (ref 8.9–10.3)
CO2: 18 mmol/L — AB (ref 22–32)
Chloride: 121 mmol/L — ABNORMAL HIGH (ref 101–111)
Creatinine, Ser: 1.03 mg/dL — ABNORMAL HIGH (ref 0.44–1.00)
GFR, EST NON AFRICAN AMERICAN: 56 mL/min — AB (ref >60)
Glucose, Bld: 103 mg/dL — ABNORMAL HIGH (ref 65–99)
POTASSIUM: 3.5 mmol/L (ref 3.5–5.1)
Sodium: 147 mmol/L — ABNORMAL HIGH (ref 135–145)

## 2016-06-14 LAB — CBC
HEMATOCRIT: 32.6 % — AB (ref 36.0–46.0)
HEMOGLOBIN: 10.4 g/dL — AB (ref 12.0–15.0)
MCH: 28.8 pg (ref 26.0–34.0)
MCHC: 31.9 g/dL (ref 30.0–36.0)
MCV: 90.3 fL (ref 78.0–100.0)
Platelets: 377 10*3/uL (ref 150–400)
RBC: 3.61 MIL/uL — AB (ref 3.87–5.11)
RDW: 14.1 % (ref 11.5–15.5)
WBC: 13.4 10*3/uL — ABNORMAL HIGH (ref 4.0–10.5)

## 2016-06-14 LAB — MAGNESIUM: MAGNESIUM: 1.9 mg/dL (ref 1.7–2.4)

## 2016-06-14 LAB — PARATHYROID HORMONE, INTACT (NO CA): PTH: 85 pg/mL — ABNORMAL HIGH (ref 15–65)

## 2016-06-14 LAB — GLUCOSE, CAPILLARY
Glucose-Capillary: 97 mg/dL (ref 65–99)
Glucose-Capillary: 117 mg/dL — ABNORMAL HIGH (ref 65–99)

## 2016-06-14 MED ORDER — RISPERIDONE 3 MG PO TABS: 3.0000 mg | ORAL_TABLET | Freq: Two times a day (BID) | ORAL | Status: DC

## 2016-06-14 MED ORDER — RISPERIDONE 3 MG PO TABS
3.0000 mg | ORAL_TABLET | Freq: Two times a day (BID) | ORAL | Status: DC
  Administered 2016-06-14 – 2016-06-18 (×9): 3 mg via ORAL
  Filled 2016-06-14 (×10): qty 1

## 2016-06-14 MED ORDER — LORAZEPAM 2 MG/ML IJ SOLN
0.5000 mg | Freq: Once | INTRAMUSCULAR | Status: AC
  Administered 2016-06-14: 0.5 mg via INTRAVENOUS
  Filled 2016-06-14: qty 1

## 2016-06-14 NOTE — Progress Notes (Signed)
Call received per floor RN at 2046 regarding Pt with acute change in mental status. Per floor RN day shift reported Pt oriented to name and attempting to get OOB often. Tonight Pt found lethargic and minimally responsive to floor RN. Po2 78% on room air per floor RN. Advised RN to place Pt on NRB check full set of VS and page MD while RRT en route. Upon my arrival at 2050 Pt found resting in bed, eyes open, able to track. Pupils size 3 equal round responsive.  Withdraws to pain x 4 extremities. Pt seen moving all four limbs with fair strength but does not follow commands.  Lung sounds clear, Po2 100% on NRB and placed on [place] 4 L . Tylene Fantasia [facility] paged and updated on Pt status. Discussed current assessment and chart notes showing Pt wax/wane neuro status and lethargy since admission. No orders at this time. RN advised to monitor Pt closely tonight, alert Provider and myself for worsening changes. RRT to follow tonight.

## 2016-06-14 NOTE — Progress Notes (Signed)
PROGRESS NOTE    Heidi Blevins  [id number] DOB: [birth year] DOA: 06/12/2016 PCP: [name], MD   Brief Narrative: Heidi Blevins is a 65 y.o. female with a history of hypertension, depression, anxiety, schizoaffective disorder, hypercalcemia, bifasicular block, tobacco abuse, stroke. She presented with altered mental status which has been unresolved from previous admission for UTI. She was initially started on ceftriaxone but transition to vancomycin for history of enterococcus faecium.   Assessment & Plan:   Principal Problem:   UTI (urinary tract infection) Active Problems:   Hypercalcemia   Dehydration   Acute encephalopathy   Cerebrovascular disease   Schizoaffective disorder (HCC)   Bifascicular block   AKI (acute kidney injury) ([place])   Essential hypertension   Acute metabolic encephalopathy Likely secondary to UTI vs dehydration vs aspiration pneumonitis vs combination. -continue IV fluids -continue vancomycin/cefepime  Pyuria Likely UTI -continue vancomycin as above -urine culture pending  LLL opacity Concerning for HCAP/aspiration pneumonia. Speech therapy recommends NPO for now. Viral respiratory panel negative -continue cefepime  AKI Improved -continue IVF  Hypercalcemia ?Chronic. Recently worked up. Recent PTH elevated. Appears likely that this is secondary to chronic lithium usage -monitor calcium  Schizoaffective disorder -continue cogentin, prolixin, lithium, Risperdal -continue zoloft -if no improvement in mental status, may consult psych for adjustment of medications    DVT prophylaxis: Lovenox subq Code Status: Full code Family Communication: Son, sister Disposition Plan: Discharge to SNF in 2-3 days   Consultants:   None  Procedures:  None  Antimicrobials:   Ceftriaxone (10/17)  Vancomycin (10/17>  Cefepime (10/17>>   Subjective: Patient reports no pain.  Objective: Vitals:   06/13/16 2116 06/14/16  0549 06/14/16 0800 06/14/16 1338  BP: (!) 167/89 (!) 169/97 (!) 173/90 (!) 155/87  Pulse: 85 95 86 (!) 106  Resp: 20 20 18 20   Temp: 99.8 F (37.7 C) 98.2 F (36.8 C) 98.3 F (36.8 C) 97.6 F (36.4 C)  TempSrc: Oral Oral Oral Oral  SpO2: 98% 100% 97% 100%  Weight:      Height:        Intake/Output Summary (Last 24 hours) at 06/14/16 1507 Last data filed at 06/14/16 0658  Gross per 24 hour  Intake           1477.5 ml  Output                0 ml  Net           1477.5 ml   Filed Weights   06/13/16 1443  Weight: 66.1 kg (145 lb 11.2 oz)    Examination:  General exam: Appears calm and comfortable. Lying in right side. Respiratory system: Clear to auscultation. Respiratory effort normal. Cardiovascular system: S1 & S2 heard, RRR. No murmurs, rubs, gallops or clicks. Gastrointestinal system: Abdomen is nondistended, soft and nontender. No organomegaly or masses felt. Normal bowel sounds heard. Central nervous system: Alert. Could not assess orientation. No focal neurological deficits. Extremities: No edema. No calf tenderness Skin: No cyanosis. No rashes Psychiatry: could not assess    Data Reviewed: I have personally reviewed following labs and imaging studies  CBC:  Recent Labs Lab 06/12/16 2242 06/13/16 0310 06/14/16 0716  WBC 13.1* 14.3* 13.4*  NEUTROABS 9.8*  --   --   HGB 12.1 12.1 10.4*  HCT 36.1 36.8 32.6*  MCV 88.9 88.9 90.3  PLT 323 401* [id number]   Basic Metabolic Panel:  Recent Labs Lab 06/12/16 2242 06/13/16 0310 06/14/16 0716  NA 140  144 147*  K 3.7 3.5 3.5  CL 110 110 121*  CO2 20* 22 18*  GLUCOSE 100* 108* 103*  BUN 13 12 11   CREATININE 1.23* 1.12* 1.03*  CALCIUM 11.3* 11.2* 11.0*  MG  --   --  1.9   GFR: Estimated Creatinine Clearance: 51 mL/min (by C-G formula based on SCr of 1.03 mg/dL (H)). Liver Function Tests:  Recent Labs Lab 06/12/16 2327  AST 35  ALT 26  ALKPHOS 124  BILITOT 1.1  PROT 7.3  ALBUMIN 3.5   No results for  input(s): LIPASE, AMYLASE in the last 168 hours. No results for input(s): AMMONIA in the last 168 hours. Coagulation Profile: No results for input(s): INR, PROTIME in the last 168 hours. Cardiac Enzymes: No results for input(s): CKTOTAL, CKMB, CKMBINDEX, TROPONINI in the last 168 hours. BNP (last 3 results) No results for input(s): PROBNP in the last 8760 hours. HbA1C: No results for input(s): HGBA1C in the last 72 hours. CBG:  Recent Labs Lab 06/13/16 0855 06/13/16 1252 06/14/16 0745  GLUCAP 110* 90 97   Lipid Profile: No results for input(s): CHOL, HDL, LDLCALC, TRIG, CHOLHDL, LDLDIRECT in the last 72 hours. Thyroid Function Tests: No results for input(s): TSH, T4TOTAL, FREET4, T3FREE, THYROIDAB in the last 72 hours. Anemia Panel: No results for input(s): VITAMINB12, FOLATE, FERRITIN, TIBC, IRON, RETICCTPCT in the last 72 hours. Sepsis Labs:  Recent Labs Lab 06/13/16 0310 06/13/16 0915  PROCALCITON  --  <0.10  LATICACIDVEN 0.8  --     Recent Results (from the past 240 hour(s))  Urine culture     Status: Abnormal   Collection Time: 06/06/16  4:30 PM  Result Value Ref Range Status   Specimen Description URINE, RANDOM  Final   Special Requests NONE  Final   Culture >=100,000 COLONIES/mL ENTEROCOCCUS FAECIUM (A)  Final   Report Status 06/08/2016 FINAL  Final   Organism ID, Bacteria ENTEROCOCCUS FAECIUM (A)  Final      Susceptibility   Enterococcus faecium - MIC*    AMPICILLIN >=32 RESISTANT Resistant     LEVOFLOXACIN >=8 RESISTANT Resistant     NITROFURANTOIN 256 RESISTANT Resistant     VANCOMYCIN <=0.5 SENSITIVE Sensitive     LINEZOLID 2 SENSITIVE Sensitive     * >=100,000 COLONIES/mL ENTEROCOCCUS FAECIUM  Urine culture     Status: Abnormal (Preliminary result)   Collection Time: 06/13/16  1:02 AM  Result Value Ref Range Status   Specimen Description URINE, CATHETERIZED  Final   Special Requests NONE  Final   Culture >=100,000 COLONIES/mL ENTEROCOCCUS FAECIUM  (A)  Final   Report Status PENDING  Incomplete  Culture, blood (Routine X 2) w Reflex to ID Panel     Status: None (Preliminary result)   Collection Time: 06/13/16  3:05 AM  Result Value Ref Range Status   Specimen Description BLOOD RIGHT ARM  Final   Special Requests AEROBIC BOTTLE ONLY 5ML  Final   Culture NO GROWTH < 12 HOURS  Final   Report Status PENDING  Incomplete  Culture, blood (Routine X 2) w Reflex to ID Panel     Status: None (Preliminary result)   Collection Time: 06/13/16  3:10 AM  Result Value Ref Range Status   Specimen Description BLOOD RIGHT HAND  Final   Special Requests AEROBIC BOTTLE ONLY 5ML  Final   Culture NO GROWTH < 12 HOURS  Final   Report Status PENDING  Incomplete  Respiratory Panel by PCR  Status: None   Collection Time: 06/13/16  2:26 PM  Result Value Ref Range Status   Adenovirus NOT DETECTED NOT DETECTED Final   Coronavirus 229E NOT DETECTED NOT DETECTED Final   Coronavirus HKU1 NOT DETECTED NOT DETECTED Final   Coronavirus NL63 NOT DETECTED NOT DETECTED Final   Coronavirus OC43 NOT DETECTED NOT DETECTED Final   Metapneumovirus NOT DETECTED NOT DETECTED Final   Rhinovirus / Enterovirus NOT DETECTED NOT DETECTED Final   Influenza A NOT DETECTED NOT DETECTED Final   Influenza B NOT DETECTED NOT DETECTED Final   Parainfluenza Virus 1 NOT DETECTED NOT DETECTED Final   Parainfluenza Virus 2 NOT DETECTED NOT DETECTED Final   Parainfluenza Virus 3 NOT DETECTED NOT DETECTED Final   Parainfluenza Virus 4 NOT DETECTED NOT DETECTED Final   Respiratory Syncytial Virus NOT DETECTED NOT DETECTED Final   Bordetella pertussis NOT DETECTED NOT DETECTED Final   Chlamydophila pneumoniae NOT DETECTED NOT DETECTED Final   Mycoplasma pneumoniae NOT DETECTED NOT DETECTED Final  MRSA PCR Screening     Status: Abnormal   Collection Time: 06/13/16  2:26 PM  Result Value Ref Range Status   MRSA by PCR POSITIVE (A) NEGATIVE Final    Comment:        The GeneXpert MRSA  Assay (FDA approved for NASAL specimens only), is one component of a comprehensive MRSA colonization surveillance program. It is not intended to diagnose MRSA infection nor to guide or monitor treatment for MRSA infections. RESULT CALLED TO, READ BACK BY AND VERIFIED WITH: [facility] RN 16:15 06/13/16 ([email or web address])          Radiology Studies: Dg Chest 2 View  Result Date: 06/12/2016 CLINICAL DATA:  Initial evaluation for acute altered mental status. The EXAM: CHEST  2 VIEW COMPARISON:  Prior radiograph from 06/06/2016. FINDINGS: Study somewhat limited by overlying arm on lateral projection. Cardiomegaly is stable.  Mediastinal silhouette unchanged. Lungs hypoinflated. Mild scattered peribronchial thickening. No focal infiltrates. No overt pulmonary edema. No pleural effusion. No pneumothorax. Scoliosis with Harrington rod fixation noted. No acute osseous abnormality. Osteopenia noted. IMPRESSION: 1. Mild scattered peribronchial thickening, nonspecific, but could reflect sequela of acute bronchiolitis in the correct clinical setting. Mild pulmonary interstitial congestion could also have this appearance. 2. Stable cardiomegaly. Electronically Signed   By: Jeannine Boga M.D.   On: 06/12/2016 23:41   Ct Head Wo Contrast  Result Date: 06/13/2016 CLINICAL DATA:  Acute onset of altered mental status. Initial encounter. EXAM: CT HEAD WITHOUT CONTRAST TECHNIQUE: Contiguous axial images were obtained from the base of the skull through the vertex without intravenous contrast. COMPARISON:  CT of the head performed 05/03/2016, and MRI of the brain performed 05/04/2016 FINDINGS: Brain: No evidence of acute infarction, hemorrhage, hydrocephalus, extra-axial collection or mass lesion/mass effect. Mild prominence of the ventricles and sulci suggests mild cortical volume loss. Mild periventricular and subcortical white matter change likely reflects small vessel ischemic microangiopathy. A chronic lacunar  infarct is noted at the right basal ganglia. The brainstem and fourth ventricle are within normal limits. The cerebral hemispheres demonstrate grossly normal gray-white differentiation. No mass effect or midline shift is seen. Vascular: No hyperdense vessel or unexpected calcification. Skull: There is no evidence of fracture; visualized osseous structures are unremarkable in appearance. Sinuses/Orbits: The visualized portions of the orbits are within normal limits. The paranasal sinuses and mastoid air cells are well-aerated. Other: No significant soft tissue abnormalities are seen. IMPRESSION: 1. No acute intracranial pathology seen on CT. 2. Mild cortical volume  loss and scattered small vessel ischemic microangiopathy. 3. Chronic lacunar infarct at the right basal ganglia. Electronically Signed   By: Garald Balding M.D.   On: 06/13/2016 00:13        Scheduled Meds: . aspirin  300 mg Rectal Daily  . benztropine  1 mg Oral BID  . ceFEPime (MAXIPIME) IV  1 g Intravenous Q8H  . Chlorhexidine Gluconate Cloth  6 each Topical Q0600  . cholecalciferol  1,000 Units Oral Daily  . cycloSPORINE  1 drop Both Eyes BID  . enoxaparin (LOVENOX) injection  40 mg Subcutaneous Daily  . fluPHENAZine  10 mg Oral BID  . fluticasone  1 spray Each Nare BID  . lithium carbonate  300 mg Oral BID WC  . mouth rinse  15 mL Mouth Rinse q12n4p  . Melatonin  3 mg Oral QHS  . mupirocin ointment   Nasal BID  . polyvinyl alcohol  1 drop Both Eyes BID  . risperiDONE  3 mg Oral BID  . sertraline  150 mg Oral Daily  . sodium chloride flush  3 mL Intravenous Q12H  . vancomycin  750 mg Intravenous Q12H   Continuous Infusions: . sodium chloride 0.9 % 1,000 mL with potassium chloride 20 mEq infusion 75 mL/hr at 06/14/16 0549     LOS: 1 day     Cordelia Poche [facility] 06/14/2016, 3:07 PM Pager: [phone]  If 7PM-7AM, please contact night-coverage [email or web address] Password [email or web address] 06/14/2016, 3:07 PM

## 2016-06-14 NOTE — Progress Notes (Signed)
Speech Language Pathology Treatment: Dysphagia  Patient Details Name: Heidi Blevins MRN: [id number] DOB: 11/02/1950 Today's Date: 06/14/2016 Time: 0842-0900 SLP Time Calculation (min) (ACUTE ONLY): 18 min  Assessment / Plan / Recommendation Clinical Impression  Pt demonstrates improvement in awareness of PO, but still quite lethargic. WIth tactile cues for hand over hand feeding, verbal cues for attention and awareness pt was able to take sips of water vis cup and straw though both methods elicited coughing. Attempted nectar, but by that time pt was falling asleep with PO in mouth and needed cues to swallow. Recommend pt remain NPO until arousal has improved. Pt may have meds crushed in puree, though ok to to try whole if necessary, check for pocketing.    HPI HPI: 65 y.o.femalewith medical history significant of hypertension, depression, anxiety, schizoaffective disorder, hypercalcemia, bifasicular block, tobacco abuse, stroke, who presents with AMS. Admitted with pna and UTI. She failed the SSS. Head CT showed no acute intracranial pathology.      SLP Plan  Continue with current plan of care     Recommendations  Diet recommendations: NPO (except for meds) Medication Administration: Crushed with puree                Oral Care Recommendations: Oral care QID Follow up Recommendations: 24 hour supervision/assistance;Skilled Nursing facility Plan: Continue with current plan of care       [place] Ashyia Schraeder, MA CCC-SLP [id number]  Lynann Beaver 06/14/2016, 9:04 AM

## 2016-06-14 NOTE — Clinical Social Work Note (Signed)
Clinical Social Work Assessment  Patient Details  Name: Heidi Blevins MRN: [id number] Date of Birth: [birth year]  Date of referral:  06/14/16               Reason for consult:  Discharge Planning                Permission sought to share information with:  Facility Sport and exercise psychologist, Family Supports Permission granted to share information::  No (Patient is disoriented; completed assessment with son, Heidi Blevins)  Name::     Proofreader::  SNFs  Relationship::  Son  Contact Information:  [phone]  Housing/Transportation Living arrangements for the past 2 months:  [place] of Information:  Adult Children, Facility Patient Interpreter Needed:  None Criminal Activity/Legal Involvement Pertinent to Current Situation/Hospitalization:  No - Comment as needed Significant Relationships:  Adult Children Lives with:  Facility Resident Do you feel safe going back to the place where you live?  Yes Need for family participation in patient care:  Yes (Comment)  Care giving concerns:  CSW received consult for possible discharge planning. Patient is disoriented. CSW completed assessment with patient's son, Heidi Blevins. Patient is from [facility] and per facility, is able to return once stable. Patient's son thinks patient can return with extra home health care since he does not want patient to become disoriented with different surroundings, but he will speak to his family and decide on the discharge plan.    Social Worker assessment / plan:  Patient is from [facility] and will likely return there at discharge.   Employment status:  Retired Forensic scientist:  Medicare PT Recommendations:  [place] / Referral to community resources:  [place]  Patient/Family's Response to care:  Patient's son expressed appreciation for CSW's help.  Patient/Family's Understanding of and Emotional Response to Diagnosis, Current  Treatment, and Prognosis:  No questions/concerns at this time.   Emotional Assessment Appearance:  Appears stated age Attitude/Demeanor/Rapport:  Unable to Assess Affect (typically observed):  Unable to Assess Orientation:   (Disoriented x4) Alcohol / Substance use:  Not Applicable Psych involvement (Current and /or in the community):  No (Comment)  Discharge Needs  Concerns to be addressed:  Care Coordination Readmission within the last 30 days:  No Current discharge risk:  None Barriers to Discharge:  Continued Medical Work up   [employer], [place] 06/14/2016, 11:11 AM

## 2016-06-14 NOTE — Progress Notes (Addendum)
Pt was unable to swallow, NP paged, she advised to hold all PO meds until assessed by a SLP.  Pt was very restless and unable to calm down to sleep. NP put in Ativan. Med given. Pt resting. Will continue to monitor.  Pt had Mittens on during the start of shift. The son with pt requested that the mittens be removed because it seemed to be disturbing the pt. The second son that came in to stay the night with pt requested that the mittens be replaced. Message passed on to day RN.

## 2016-06-15 LAB — BASIC METABOLIC PANEL
Anion gap: 6 (ref 5–15)
BUN: 9 mg/dL (ref 6–20)
CHLORIDE: 123 mmol/L — AB (ref 101–111)
CO2: 19 mmol/L — ABNORMAL LOW (ref 22–32)
CREATININE: 0.88 mg/dL (ref 0.44–1.00)
Calcium: 11.1 mg/dL — ABNORMAL HIGH (ref 8.9–10.3)
GFR calc non Af Amer: >60 mL/min (ref >60)
Glucose, Bld: 127 mg/dL — ABNORMAL HIGH (ref 65–99)
POTASSIUM: 3.8 mmol/L (ref 3.5–5.1)
SODIUM: 148 mmol/L — AB (ref 135–145)

## 2016-06-15 LAB — CBC
HCT: 34.3 % — ABNORMAL LOW (ref 36.0–46.0)
HEMOGLOBIN: 11.2 g/dL — AB (ref 12.0–15.0)
MCH: 29.4 pg (ref 26.0–34.0)
MCHC: 32.7 g/dL (ref 30.0–36.0)
MCV: 90 fL (ref 78.0–100.0)
Platelets: 366 10*3/uL (ref 150–400)
RBC: 3.81 MIL/uL — AB (ref 3.87–5.11)
RDW: 14.2 % (ref 11.5–15.5)
WBC: 14.7 10*3/uL — AB (ref 4.0–10.5)

## 2016-06-15 LAB — URINE CULTURE: Culture: >=100000 — AB

## 2016-06-15 LAB — GLUCOSE, CAPILLARY: GLUCOSE-CAPILLARY: 116 mg/dL — AB (ref 65–99)

## 2016-06-15 LAB — PROCALCITONIN

## 2016-06-15 MED ORDER — POTASSIUM CHLORIDE IN NACL 20-0.9 MEQ/L-% IV SOLN
INTRAVENOUS | Status: DC
  Administered 2016-06-15 – 2016-06-16 (×2): via INTRAVENOUS
  Filled 2016-06-15 (×2): qty 1000

## 2016-06-15 MED ORDER — ENSURE ENLIVE PO LIQD
237.0000 mL | Freq: Two times a day (BID) | ORAL | Status: DC
  Administered 2016-06-16 – 2016-06-18 (×4): 237 mL via ORAL

## 2016-06-15 NOTE — Progress Notes (Signed)
Speech Language Pathology Treatment: Dysphagia  Patient Details Name: LANIER YELL MRN: [id number] DOB: [birth year] Today's Date: 06/15/2016 Time: [id number] SLP Time Calculation (min) (ACUTE ONLY): 14 min  Assessment / Plan / Recommendation Clinical Impression  Pt is alert, still distractible and restless. SLP was able to use moderate to max tactile visual and verbal cues to improve pts awareness of PO. She accepted sips of thin liquids in isolation without signs of aspiration. When given solids to masticate, pt pocketed the unmasticated solid in the right buccal cavity and had some coughing with water given with solids in her mouth. Recommend a dys 1 (puree) diet and thin liquids pills whole in puree. Will f/u for further upgrade.    HPI        SLP Plan  Continue with current plan of care     Recommendations  Diet recommendations: Dysphagia 1 (puree);Thin liquid Liquids provided via: Cup;Straw Medication Administration: Crushed with puree Supervision: Staff to assist with self feeding;Full supervision/cueing for compensatory strategies Compensations: Slow rate;Small sips/bites Postural Changes and/or Swallow Maneuvers: Seated upright 90 degrees                Oral Care Recommendations: Oral care BID Follow up Recommendations: 24 hour supervision/assistance;Skilled Nursing facility Plan: Continue with current plan of care       GO                Daina Cara, Katherene Ponto 06/15/2016, 2:24 PM

## 2016-06-15 NOTE — Evaluation (Signed)
Physical Therapy Evaluation Patient Details Name: Heidi Blevins MRN: [id number] DOB: 07-06-1951 Today's Date: 06/15/2016   History of Present Illness  65 y.o. female with medical history significant of hypertension, depression, anxiety, schizoaffective disorder, hypercalcemia, bifasicular block, tobacco abuse, stroke, who presents with AMS. Admitted with pna and UTI.  Clinical Impression  Patient presents with decreased mobility due to deficits listed in PT problem list.  She presents with myoclonic jerking similar to asterixis which limits safety and independence with transfers, gait and sitting balance.  She will benefit from skilled PT in the acute setting and follow up SNF level rehab prior to return to ALF.    Follow Up Recommendations SNF    Equipment Recommendations  None recommended by PT    Recommendations for Other Services       Precautions / Restrictions Precautions Precautions: Fall      Mobility  Bed Mobility Overal bed mobility: Needs Assistance Bed Mobility: Supine to Sit;Sit to Supine     Supine to sit: Max assist Sit to supine: Mod assist   General bed mobility comments: allowed pt increased time to allow her to initiate movement to EOB and for progress with multimodal cues, but pt unable to process movement, so provided help for legs off bed and to lift trunk and scoot to EOB, to supine lifted legs into bed  Transfers Overall transfer level: Needs assistance   Transfers: Stand Pivot Transfers   Stand pivot transfers: Max assist       General transfer comment: pt able to initiate, but legs buckle with tremor (?asterixis) requiring help to prevent falls pivot to chair, then BSC, then to bed  Ambulation/Gait             General Gait Details: unable due to LE buckling  Stairs            Wheelchair Mobility    Modified Rankin (Stroke Patients Only)       Balance Overall balance assessment: Needs assistance   Sitting  balance-Leahy Scale: Poor Sitting balance - Comments: falls backward, then recovers on her own most of the time, involuntary muscle jerks   Standing balance support: Bilateral upper extremity supported Standing balance-Leahy Scale: Poor Standing balance comment: pivoting to bed from [facility] assist for balance while pulling up brief                             Pertinent Vitals/Pain Faces Pain Scale: No hurt    Home Living Family/patient expects to be discharged to:: Assisted living                      Prior Function           Comments: son reports pt was able to walk without device prior to illness, but states she has been back and forth couple of times (to hospital for tx of UTI, then not cleared up prior to return to ALF)      Hand Dominance        Extremity/Trunk Assessment   Upper Extremity Assessment: Generalized weakness;Difficult to assess due to impaired cognition           Lower Extremity Assessment: Generalized weakness;Difficult to assess due to impaired cognition         Communication   Communication: Expressive difficulties (mumbles)  Cognition Arousal/Alertness: Awake/alert Behavior During Therapy: Flat affect Overall Cognitive Status: Impaired/Different from baseline Area of Impairment: Attention;Following commands;Problem solving  Current Attention Level: Focused   Following Commands: Follows one step commands inconsistently     Problem Solving: Slow processing;Decreased initiation;Requires tactile cues General Comments: son relates pt usually with wax/wane of mental status due to prolonged use of meds for schizophrenia    General Comments General comments (skin integrity, edema, etc.): attempted to engage pt in LE therex in sitting, but pt unable to complete more that one or two reps with visual demonstration, multimodal cues    Exercises     Assessment/Plan    PT Assessment Patient needs continued PT services  PT  Problem List Decreased strength;Decreased activity tolerance;Decreased balance;Decreased mobility;Decreased knowledge of use of DME;Decreased safety awareness;Decreased cognition          PT Treatment Interventions DME instruction;Therapeutic activities;Therapeutic exercise;Patient/family education;Gait training;Functional mobility training    PT Goals (Current goals can be found in the Care Plan section)  Acute Rehab PT Goals Patient Stated Goal: To get stronger back to functioning at ALF PT Goal Formulation: With family Time For Goal Achievement: 06/22/16 Potential to Achieve Goals: Fair    Frequency Min 3X/week   Barriers to discharge        Co-evaluation               End of Session Equipment Utilized During Treatment: Gait belt Activity Tolerance: Patient limited by fatigue Patient left: in bed;with call bell/phone within reach;with bed alarm set;with family/visitor present           Time: 1232-1256 PT Time Calculation (min) (ACUTE ONLY): 24 min   Charges:   PT Evaluation $PT Eval Moderate Complexity: 1 Procedure PT Treatments $Therapeutic Activity: 8-22 mins   PT G CodesReginia Naas 2016/07/11, 5:05 PM  Magda Kiel, [place] 2016/07/11

## 2016-06-15 NOTE — NC FL2 (Signed)
[place] LEVEL OF CARE SCREENING TOOL     IDENTIFICATION  Patient Name: Heidi Blevins Birthdate: Apr 11, 1951 Sex: female Admission Date (Current Location): 06/12/2016  [facility] and [place] Number:  Herbalist and Address:  The . [facility], [place] [address], [place], [place] [address]      Provider Number: [id number]  Attending Physician Name and Address:  Mariel Aloe, MD  Relative Name and Phone Number:  Reece Agar, son, [phone]    Current Level of Care: Hospital Recommended Level of Care: [place] Prior Approval Number:    Date Approved/Denied:   PASRR Number:    Discharge Plan: SNF    Current Diagnoses: Patient Active Problem List   Diagnosis Date Noted  . AKI (acute kidney injury) ([place]) 06/13/2016  . UTI (urinary tract infection) 06/13/2016  . Essential hypertension 06/13/2016  . Urinary tract infection without hematuria   . Complicated UTI (urinary tract infection) 05/03/2016  . Hypercalcemia 05/03/2016  . Dehydration 05/03/2016  . Acute encephalopathy 05/03/2016  . 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 Schizoaffective disorder ([place]) 05/03/2016  . Bifascicular block 05/03/2016    Orientation RESPIRATION BLADDER Height & Weight     Self  Normal Incontinent Weight: 145 lb 11.2 oz (66.1 kg) Height:  5\' 6"  (167.6 cm)  BEHAVIORAL SYMPTOMS/MOOD NEUROLOGICAL BOWEL NUTRITION STATUS   (None)   Continent Diet  AMBULATORY STATUS COMMUNICATION OF NEEDS Skin   Limited Assist Verbally Normal                       Personal Care Assistance Level of Assistance  Bathing, Dressing Bathing Assistance: Limited assistance Feeding assistance: Limited assistance Dressing Assistance: Limited assistance     Functional Limitations Info             SPECIAL CARE FACTORS FREQUENCY  PT (By licensed PT), OT (By licensed OT)     PT Frequency: 5/wk OT Frequency: 5/wk            Contractures       Additional Factors Info  Code Status, Allergies, Psychotropic, Isolation Precautions Code Status Info: FULL Allergies Info: Sulfonamide Derivatives Psychotropic Info: zoloft, risperdal   Isolation Precautions Info: MRSA     Current Medications (06/15/2016):  This is the current hospital active medication list Current Facility-Administered Medications  Medication Dose Route Frequency Provider Last Rate Last Dose  . acetaminophen (TYLENOL) tablet 650 mg  650 mg Oral Q6H PRN Ivor Costa, MD       Or  . acetaminophen (TYLENOL) suppository 650 mg  650 mg Rectal Q6H PRN Ivor Costa, MD      . aspirin suppository 300 mg  300 mg Rectal Daily Orson Eva, MD   300 mg at 06/15/16 0904  . benztropine (COGENTIN) tablet 1 mg  1 mg Oral BID Ivor Costa, MD   1 mg at 06/15/16 0839  . ceFEPIme (MAXIPIME) 1 g in dextrose 5 % 50 mL IVPB  1 g Intravenous Q8H Rachel L Rumbarger, RPH   1 g at 06/15/16 1136  . Chlorhexidine Gluconate Cloth 2 % PADS 6 each  6 each Topical Q0600 Orson Eva, MD   6 each at 06/14/16 0549  . cholecalciferol (VITAMIN D) tablet 1,000 Units  1,000 Units Oral Daily Ivor Costa, MD   1,000 Units at 06/15/16 [phone]  . cycloSPORINE (RESTASIS) 0.05 % ophthalmic emulsion 1 drop  1 drop Both Eyes BID Ivor Costa, MD   1 drop at  06/15/16 0856  . enoxaparin (LOVENOX) injection 40 mg  40 mg Subcutaneous Daily Ivor Costa, MD   40 mg at 06/15/16 0847  . fluPHENAZine (PROLIXIN) tablet 10 mg  10 mg Oral BID Ivor Costa, MD   10 mg at 06/15/16 0835  . fluticasone (FLONASE) 50 MCG/ACT nasal spray 1 spray  1 spray Each Nare BID Ivor Costa, MD   1 spray at 06/15/16 0844  . guaifenesin (ROBITUSSIN) 100 MG/5ML syrup 200 mg  200 mg Oral Q6H PRN Ivor Costa, MD      . hydrALAZINE (APRESOLINE) injection 5 mg  5 mg Intravenous Q2H PRN Ivor Costa, MD      . lithium carbonate capsule 300 mg  300 mg Oral BID WC Ivor Costa, MD   300 mg at 06/15/16 0834  . MEDLINE mouth rinse  15 mL Mouth Rinse q12n4p Orson Eva, MD   15 mL at  06/14/16 1600  . Melatonin TABS 3 mg  3 mg Oral QHS Ivor Costa, MD   3 mg at 06/14/16 2237  . mupirocin ointment (BACTROBAN) 2 %   Nasal BID Orson Eva, MD      . ondansetron [facility]) tablet 4 mg  4 mg Oral Q6H PRN Ivor Costa, MD       Or  . ondansetron [facility]) injection 4 mg  4 mg Intravenous Q6H PRN Ivor Costa, MD      . polyvinyl alcohol (LIQUIFILM TEARS) 1.4 % ophthalmic solution 1 drop  1 drop Both Eyes BID Ivor Costa, MD   1 drop at 06/15/16 0859  . risperiDONE (RISPERDAL) tablet 3 mg  3 mg Oral BID Gardiner Barefoot, NP   3 mg at 06/15/16 [id number]  . sertraline (ZOLOFT) tablet 150 mg  150 mg Oral Daily Ivor Costa, MD   150 mg at 06/15/16 0836  . sodium chloride 0.9 % 1,000 mL with potassium chloride 20 mEq infusion   Intravenous Continuous Orson Eva, MD 75 mL/hr at 06/15/16 0100    . sodium chloride flush (NS) 0.9 % injection 3 mL  3 mL Intravenous Q12H Ivor Costa, MD   3 mL at 06/14/16 2238  . vancomycin (VANCOCIN) IVPB 750 mg/150 ml premix  750 mg Intravenous Q12H Valeda Malm Rumbarger, RPH   750 mg at 06/15/16 [id number]     Discharge Medications: Please see discharge summary for a list of discharge medications.  Relevant Imaging Results:  Relevant Lab Results:   Additional Information SSN: [id number]      Faysal Fenoglio, Connye Burkitt, LCSW

## 2016-06-15 NOTE — Progress Notes (Signed)
PROGRESS NOTE    Heidi Blevins  [id number] DOB: 02-24-1951 DOA: 06/12/2016 PCP: [name], MD   Brief Narrative: Heidi Blevins is a 65 y.o. female with a history of hypertension, depression, anxiety, schizoaffective disorder, hypercalcemia, bifasicular block, tobacco abuse, stroke. She presented with altered mental status which has been unresolved from previous admission for UTI. She was initially started on ceftriaxone but transition to vancomycin for history of enterococcus faecium.   Assessment & Plan:   Principal Problem:   UTI (urinary tract infection) Active Problems:   Hypercalcemia   Dehydration   Acute encephalopathy   Cerebrovascular disease   Schizoaffective disorder (HCC)   Bifascicular block   AKI (acute kidney injury) ([place])   Essential hypertension   Acute metabolic encephalopathy Likely secondary to untreated UTI vs dehydration vs aspiration pneumonitis vs combination. Urine culture positive for enterococcus faecium -continue IV fluids -continue vancomycin/cefepime  UTI Enterococcus faecium sensitive to vancomycin -continue vancomycin as above  LLL opacity Concerning for HCAP/aspiration pneumonia. Speech therapy recommends NPO for now. Viral respiratory panel negative -continue cefepime -wean to room air  AKI Improved -continue IVF  Hypercalcemia ?Chronic. Recently worked up. Recent PTH elevated. Appears likely that this is secondary to chronic lithium usage -monitor calcium  Schizoaffective disorder -continue cogentin, prolixin, lithium, Risperdal -continue zoloft -if no improvement in mental status, may consult psych for adjustment of medications    DVT prophylaxis: Lovenox subq Code Status: Full code Family Communication: None at bedside Disposition Plan: Discharge to SNF in 1-2 days   Consultants:   None  Procedures:  None  Antimicrobials:   Ceftriaxone (10/17)  Vancomycin (10/17>  Cefepime  (10/17>>   Subjective: Patient reports no pain. Had an episode overnight where she had acute hypoxic failure requiring O2. Rapid response called.  Objective: Vitals:   06/14/16 2336 06/15/16 0618 06/15/16 0953 06/15/16 1400  BP: (!) 142/81 (!) 149/88 (!) 162/89 (!) 146/88  Pulse: 82 89 (!) 105 89  Resp: 16 18 18 18   Temp: 98.5 F (36.9 C) 97.7 F (36.5 C) 98.9 F (37.2 C) 98.6 F (37 C)  TempSrc: Oral Oral Oral   SpO2: 100% 100% 97% 100%  Weight:      Height:        Intake/Output Summary (Last 24 hours) at 06/15/16 1413 Last data filed at 06/15/16 0939  Gross per 24 hour  Intake             1100 ml  Output                0 ml  Net             1100 ml   Filed Weights   06/13/16 1443  Weight: 66.1 kg (145 lb 11.2 oz)    Examination:  General exam: Appears calm and comfortable. Lying in right side. Respiratory system: Clear to auscultation. Respiratory effort normal. Cardiovascular system: S1 & S2 heard, RRR. No murmurs, rubs, gallops or clicks. Gastrointestinal system: Abdomen is nondistended, soft and nontender. No organomegaly or masses felt. Normal bowel sounds heard. Central nervous system: Alert. Could not assess orientation. Extremities: No edema. No calf tenderness Skin: No cyanosis. No rashes Psychiatry: could not assess    Data Reviewed: I have personally reviewed following labs and imaging studies  CBC:  Recent Labs Lab 06/12/16 2242 06/13/16 0310 06/14/16 0716  WBC 13.1* 14.3* 13.4*  NEUTROABS 9.8*  --   --   HGB 12.1 12.1 10.4*  HCT 36.1 36.8 32.6*  MCV 88.9 88.9 90.3  PLT 323 401* [id number]   Basic Metabolic Panel:  Recent Labs Lab 06/12/16 2242 06/13/16 0310 06/14/16 0716  NA 140 144 147*  K 3.7 3.5 3.5  CL 110 110 121*  CO2 20* 22 18*  GLUCOSE 100* 108* 103*  BUN 13 12 11   CREATININE 1.23* 1.12* 1.03*  CALCIUM 11.3* 11.2* 11.0*  MG  --   --  1.9   GFR: Estimated Creatinine Clearance: 51 mL/min (by C-G formula based on SCr of  1.03 mg/dL (H)). Liver Function Tests:  Recent Labs Lab 06/12/16 2327  AST 35  ALT 26  ALKPHOS 124  BILITOT 1.1  PROT 7.3  ALBUMIN 3.5   No results for input(s): LIPASE, AMYLASE in the last 168 hours. No results for input(s): AMMONIA in the last 168 hours. Coagulation Profile: No results for input(s): INR, PROTIME in the last 168 hours. Cardiac Enzymes: No results for input(s): CKTOTAL, CKMB, CKMBINDEX, TROPONINI in the last 168 hours. BNP (last 3 results) No results for input(s): PROBNP in the last 8760 hours. HbA1C: No results for input(s): HGBA1C in the last 72 hours. CBG:  Recent Labs Lab 06/13/16 0855 06/13/16 1252 06/14/16 0745 06/14/16 2058 06/15/16 0850  GLUCAP 110* 90 97 117* 116*   Lipid Profile: No results for input(s): CHOL, HDL, LDLCALC, TRIG, CHOLHDL, LDLDIRECT in the last 72 hours. Thyroid Function Tests: No results for input(s): TSH, T4TOTAL, FREET4, T3FREE, THYROIDAB in the last 72 hours. Anemia Panel: No results for input(s): VITAMINB12, FOLATE, FERRITIN, TIBC, IRON, RETICCTPCT in the last 72 hours. Sepsis Labs:  Recent Labs Lab 06/13/16 0310 06/13/16 0915 06/15/16 0501  PROCALCITON  --  <0.10 <0.10  LATICACIDVEN 0.8  --   --     Recent Results (from the past 240 hour(s))  Urine culture     Status: Abnormal   Collection Time: 06/06/16  4:30 PM  Result Value Ref Range Status   Specimen Description URINE, RANDOM  Final   Special Requests NONE  Final   Culture >=100,000 COLONIES/mL ENTEROCOCCUS FAECIUM (A)  Final   Report Status 06/08/2016 FINAL  Final   Organism ID, Bacteria ENTEROCOCCUS FAECIUM (A)  Final      Susceptibility   Enterococcus faecium - MIC*    AMPICILLIN >=32 RESISTANT Resistant     LEVOFLOXACIN >=8 RESISTANT Resistant     NITROFURANTOIN 256 RESISTANT Resistant     VANCOMYCIN <=0.5 SENSITIVE Sensitive     LINEZOLID 2 SENSITIVE Sensitive     * >=100,000 COLONIES/mL ENTEROCOCCUS FAECIUM  Urine culture     Status:  Abnormal   Collection Time: 06/13/16  1:02 AM  Result Value Ref Range Status   Specimen Description URINE, CATHETERIZED  Final   Special Requests NONE  Final   Culture >=100,000 COLONIES/mL ENTEROCOCCUS FAECIUM (A)  Final   Report Status 06/15/2016 FINAL  Final   Organism ID, Bacteria ENTEROCOCCUS FAECIUM (A)  Final      Susceptibility   Enterococcus faecium - MIC*    AMPICILLIN >=32 RESISTANT Resistant     LEVOFLOXACIN >=8 RESISTANT Resistant     NITROFURANTOIN 256 RESISTANT Resistant     VANCOMYCIN <=0.5 SENSITIVE Sensitive     * >=100,000 COLONIES/mL ENTEROCOCCUS FAECIUM  Culture, blood (Routine X 2) w Reflex to ID Panel     Status: None (Preliminary result)   Collection Time: 06/13/16  3:05 AM  Result Value Ref Range Status   Specimen Description BLOOD RIGHT ARM  Final   Special Requests AEROBIC  BOTTLE ONLY 5ML  Final   Culture NO GROWTH 1 DAY  Final   Report Status PENDING  Incomplete  Culture, blood (Routine X 2) w Reflex to ID Panel     Status: None (Preliminary result)   Collection Time: 06/13/16  3:10 AM  Result Value Ref Range Status   Specimen Description BLOOD RIGHT HAND  Final   Special Requests AEROBIC BOTTLE ONLY 5ML  Final   Culture NO GROWTH 1 DAY  Final   Report Status PENDING  Incomplete  Respiratory Panel by PCR     Status: None   Collection Time: 06/13/16  2:26 PM  Result Value Ref Range Status   Adenovirus NOT DETECTED NOT DETECTED Final   Coronavirus 229E NOT DETECTED NOT DETECTED Final   Coronavirus HKU1 NOT DETECTED NOT DETECTED Final   Coronavirus NL63 NOT DETECTED NOT DETECTED Final   Coronavirus OC43 NOT DETECTED NOT DETECTED Final   Metapneumovirus NOT DETECTED NOT DETECTED Final   Rhinovirus / Enterovirus NOT DETECTED NOT DETECTED Final   Influenza A NOT DETECTED NOT DETECTED Final   Influenza B NOT DETECTED NOT DETECTED Final   Parainfluenza Virus 1 NOT DETECTED NOT DETECTED Final   Parainfluenza Virus 2 NOT DETECTED NOT DETECTED Final    Parainfluenza Virus 3 NOT DETECTED NOT DETECTED Final   Parainfluenza Virus 4 NOT DETECTED NOT DETECTED Final   Respiratory Syncytial Virus NOT DETECTED NOT DETECTED Final   Bordetella pertussis NOT DETECTED NOT DETECTED Final   Chlamydophila pneumoniae NOT DETECTED NOT DETECTED Final   Mycoplasma pneumoniae NOT DETECTED NOT DETECTED Final  MRSA PCR Screening     Status: Abnormal   Collection Time: 06/13/16  2:26 PM  Result Value Ref Range Status   MRSA by PCR POSITIVE (A) NEGATIVE Final    Comment:        The GeneXpert MRSA Assay (FDA approved for NASAL specimens only), is one component of a comprehensive MRSA colonization surveillance program. It is not intended to diagnose MRSA infection nor to guide or monitor treatment for MRSA infections. RESULT CALLED TO, READ BACK BY AND VERIFIED WITH: [facility] RN 16:15 06/13/16 ([email or web address])          Radiology Studies: No results found.      Scheduled Meds: . aspirin  300 mg Rectal Daily  . benztropine  1 mg Oral BID  . ceFEPime (MAXIPIME) IV  1 g Intravenous Q8H  . Chlorhexidine Gluconate Cloth  6 each Topical Q0600  . cholecalciferol  1,000 Units Oral Daily  . cycloSPORINE  1 drop Both Eyes BID  . enoxaparin (LOVENOX) injection  40 mg Subcutaneous Daily  . fluPHENAZine  10 mg Oral BID  . fluticasone  1 spray Each Nare BID  . lithium carbonate  300 mg Oral BID WC  . mouth rinse  15 mL Mouth Rinse q12n4p  . Melatonin  3 mg Oral QHS  . mupirocin ointment   Nasal BID  . polyvinyl alcohol  1 drop Both Eyes BID  . risperiDONE  3 mg Oral BID  . sertraline  150 mg Oral Daily  . sodium chloride flush  3 mL Intravenous Q12H  . vancomycin  750 mg Intravenous Q12H   Continuous Infusions: . sodium chloride 0.9 % 1,000 mL with potassium chloride 20 mEq infusion 75 mL/hr at 06/15/16 0100     LOS: 2 days     Cordelia Poche [facility] 06/15/2016, 2:13 PM Pager: [phone]  If 7PM-7AM, please contact  night-coverage [email or web address] Password [email or web address]  06/15/2016, 2:13 PM

## 2016-06-15 NOTE — Progress Notes (Signed)
Nutrition Follow-up  DOCUMENTATION CODES:   Not applicable  INTERVENTION:   -Ensure Enlive po BID, each supplement provides 350 kcal and 20 grams of protein  NUTRITION DIAGNOSIS:   Predicted suboptimal nutrient intake related to lethargy/confusion as evidenced by  (decreased alertness, nutrition hx).  Ongoing  GOAL:   Patient will meet greater than or equal to 90% of their needs  Progressing  MONITOR:   PO intake, Supplement acceptance, Diet advancement, Labs, Weight trends, Skin, I & O's  REASON FOR ASSESSMENT:   Consult Assessment of nutrition requirement/status  ASSESSMENT:   Heidi Blevins is a 65 y.o. female with medical history significant of hypertension, depression, anxiety, schizoaffective disorder, hypercalcemia, bifasicular block, tobacco abuse, stroke, who presents with AMS.  Pt re-evaluated by SLP; diet was held up until today, due to somnolence. Pt is more alert today, however, pocketing solid foods. SLP advanced diet to dysphagia 1 with thin liquids.   RD will add supplements to assist in optimizing nutritional intake, as well as due to hx of poor oral intake and supplement use PTA.   Labs reviewed: Na: 147, CBGS: 97-117.  Diet Order:  DIET - DYS 1 Room service appropriate? Yes; Fluid consistency: Thin  Skin:  Reviewed, no issues  Last BM:  PTA  Height:   Ht Readings from Last 1 Encounters:  06/13/16 5\' 6"  (1.676 m)    Weight:   Wt Readings from Last 1 Encounters:  06/13/16 145 lb 11.2 oz (66.1 kg)    Ideal Body Weight:  59.1 kg  BMI:  Body mass index is 23.52 kg/m.  Estimated Nutritional Needs:   Kcal:  1600-1800  Protein:  75-90 grams  Fluid:  1.6-1.8 L  EDUCATION NEEDS:   No education needs identified at this time  Celese Banner A. Jimmye Norman, RD, LDN, CDE Pager: [phone] After hours Pager: [phone]

## 2016-06-15 NOTE — Progress Notes (Signed)
Pt iv vanc was hang up by the first shift nurse without open to run, pharmacy was called to reschedlule her vanc that was due at 2200 for me to give the previous one that was not open to run, I called [facility] and the on call physician because pt was not actually behaving and looks like the report I received from the day shift nurse, Brooke from [facility] came to review pt and the on call physician called to check up on patient but she said that is pt baseline, will continue to monitor pt

## 2016-06-16 DIAGNOSIS — B952 Enterococcus as the cause of diseases classified elsewhere: Secondary | ICD-10-CM

## 2016-06-16 LAB — BASIC METABOLIC PANEL
ANION GAP: 7 (ref 5–15)
BUN: 8 mg/dL (ref 6–20)
CO2: 19 mmol/L — AB (ref 22–32)
Calcium: 11 mg/dL — ABNORMAL HIGH (ref 8.9–10.3)
Chloride: 123 mmol/L — ABNORMAL HIGH (ref 101–111)
Creatinine, Ser: 0.98 mg/dL (ref 0.44–1.00)
GFR, EST NON AFRICAN AMERICAN: 59 mL/min — AB (ref >60)
GLUCOSE: 113 mg/dL — AB (ref 65–99)
POTASSIUM: 4 mmol/L (ref 3.5–5.1)
Sodium: 149 mmol/L — ABNORMAL HIGH (ref 135–145)

## 2016-06-16 LAB — CBC
HEMATOCRIT: 34.9 % — AB (ref 36.0–46.0)
HEMOGLOBIN: 11.3 g/dL — AB (ref 12.0–15.0)
MCH: 29.3 pg (ref 26.0–34.0)
MCHC: 32.4 g/dL (ref 30.0–36.0)
MCV: 90.4 fL (ref 78.0–100.0)
Platelets: 358 10*3/uL (ref 150–400)
RBC: 3.86 MIL/uL — AB (ref 3.87–5.11)
RDW: 14.5 % (ref 11.5–15.5)
WBC: 12.9 10*3/uL — ABNORMAL HIGH (ref 4.0–10.5)

## 2016-06-16 LAB — GLUCOSE, CAPILLARY: Glucose-Capillary: 116 mg/dL — ABNORMAL HIGH (ref 65–99)

## 2016-06-16 LAB — VANCOMYCIN, TROUGH: Vancomycin Tr: 26 ug/mL (ref 15–20)

## 2016-06-16 MED ORDER — LEVOFLOXACIN 500 MG PO TABS
500.0000 mg | ORAL_TABLET | Freq: Every day | ORAL | Status: DC
Start: 2016-06-16 — End: 2016-06-17
  Administered 2016-06-16 – 2016-06-17 (×2): 500 mg via ORAL
  Filled 2016-06-16: qty 1

## 2016-06-16 MED ORDER — LISINOPRIL 5 MG PO TABS
5.0000 mg | ORAL_TABLET | Freq: Every day | ORAL | Status: DC
  Administered 2016-06-16 – 2016-06-17 (×2): 5 mg via ORAL
  Filled 2016-06-16 (×2): qty 1

## 2016-06-16 MED ORDER — VANCOMYCIN HCL 10 G IV SOLR
1250.0000 mg | INTRAVENOUS | Status: DC
  Administered 2016-06-16 – 2016-06-17 (×2): 1250 mg via INTRAVENOUS
  Filled 2016-06-16 (×3): qty 1250

## 2016-06-16 MED ORDER — METOPROLOL TARTRATE 25 MG PO TABS: 25.0000 mg | ORAL_TABLET | Freq: Two times a day (BID) | ORAL | Status: DC

## 2016-06-16 MED ORDER — HALOPERIDOL LACTATE 5 MG/ML IJ SOLN
1.0000 mg | Freq: Once | INTRAMUSCULAR | Status: AC
  Administered 2016-06-16: 1 mg via INTRAVENOUS
  Filled 2016-06-16: qty 1

## 2016-06-16 MED ORDER — SODIUM CHLORIDE 0.45 % IV SOLN
INTRAVENOUS | Status: DC
  Administered 2016-06-16 – 2016-06-17 (×2): via INTRAVENOUS

## 2016-06-16 NOTE — Progress Notes (Signed)
PROGRESS NOTE    Heidi Blevins  [id number] DOB: 1950-09-29 DOA: 06/12/2016 PCP: [name], MD   Brief Narrative: Heidi Blevins is a 65 y.o. female with a history of hypertension, depression, anxiety, schizoaffective disorder, hypercalcemia, bifasicular block, tobacco abuse, stroke. She presented with altered mental status which has been unresolved from previous admission for UTI. She was initially started on ceftriaxone but transition to vancomycin for history of enterococcus faecium.   Assessment & Plan:   Principal Problem:   UTI (urinary tract infection) Active Problems:   Hypercalcemia   Dehydration   Acute encephalopathy   Cerebrovascular disease   Schizoaffective disorder (HCC)   Bifascicular block   AKI (acute kidney injury) ([place])   Essential hypertension   Acute metabolic encephalopathy Likely secondary to untreated UTI vs dehydration vs aspiration pneumonitis vs combination. Urine culture positive for enterococcus faecium. Appears to be improved. -continue IV fluids -continue vancomycin/cefepime  UTI Enterococcus faecium sensitive to vancomycin -continue vancomycin as above  LLL opacity Concerning for HCAP/aspiration pneumonia. Viral respiratory panel negative -discontinue cefepime -start levaquin  AKI Improved -continue IVF  Hypercalcemia ?Chronic. Recently worked up. Recent PTH elevated. Appears likely that this is secondary to chronic lithium usage -monitor calcium  Schizoaffective disorder -continue cogentin, prolixin, lithium, Risperdal -continue zoloft   DVT prophylaxis: Lovenox subq Code Status: Full code Family Communication: None at bedside Disposition Plan: Discharge to SNF in tomorrow   Consultants:   None  Procedures:  None  Antimicrobials:   Ceftriaxone (10/17)  Cefepime (10/17>>10/18)  Vancomycin (10/17>>  Levaquin (10/20>>   Subjective: Patient reports a headache. Otherwise, no  pain  Objective: Vitals:   06/15/16 0953 06/15/16 1400 06/15/16 2255 06/16/16 0514  BP: (!) 162/89 (!) 146/88 (!) 161/95 (!) 162/103  Pulse: (!) 105 89 91 96  Resp: 18 18 20 18   Temp: 98.9 F (37.2 C) 98.6 F (37 C) 98.1 F (36.7 C) 98.8 F (37.1 C)  TempSrc: Oral  Oral Oral  SpO2: 97% 100% 98% 99%  Weight:      Height:        Intake/Output Summary (Last 24 hours) at 06/16/16 1123 Last data filed at 06/16/16 0900  Gross per 24 hour  Intake          1857.25 ml  Output                0 ml  Net          1857.25 ml   Filed Weights   06/13/16 1443  Weight: 66.1 kg (145 lb 11.2 oz)    Examination:  General exam: Appears calm and comfortable. Lying in right side. Respiratory system: Clear to auscultation. Respiratory effort slightly increased. Cardiovascular system: S1 & S2 heard, RRR. No murmurs, rubs, gallops or clicks. Gastrointestinal system: Abdomen is nondistended, soft and nontender. No organomegaly or masses felt. Normal bowel sounds heard. Central nervous system: Alert. Oriented to person only. Responds appropriately to commands. Extremities: No edema. No calf tenderness Skin: No cyanosis. No rashes Psychiatry: Has conversations with those not physically in the room.     Data Reviewed: I have personally reviewed following labs and imaging studies  CBC:  Recent Labs Lab 06/12/16 2242 06/13/16 0310 06/14/16 0716 06/15/16 1425 06/16/16 0755  WBC 13.1* 14.3* 13.4* 14.7* 12.9*  NEUTROABS 9.8*  --   --   --   --   HGB 12.1 12.1 10.4* 11.2* 11.3*  HCT 36.1 36.8 32.6* 34.3* 34.9*  MCV 88.9 88.9 90.3 90.0 90.4  PLT 323 401* 377 366 [id number]   Basic Metabolic Panel:  Recent Labs Lab 06/12/16 2242 06/13/16 0310 06/14/16 0716 06/15/16 1425 06/16/16 0755  NA 140 144 147* 148* 149*  K 3.7 3.5 3.5 3.8 4.0  CL 110 110 121* 123* 123*  CO2 20* 22 18* 19* 19*  GLUCOSE 100* 108* 103* 127* 113*  BUN 13 12 11 9 8   CREATININE 1.23* 1.12* 1.03* 0.88 0.98  CALCIUM  11.3* 11.2* 11.0* 11.1* 11.0*  MG  --   --  1.9  --   --    GFR: Estimated Creatinine Clearance: 53.6 mL/min (by C-G formula based on SCr of 0.98 mg/dL). Liver Function Tests:  Recent Labs Lab 06/12/16 2327  AST 35  ALT 26  ALKPHOS 124  BILITOT 1.1  PROT 7.3  ALBUMIN 3.5   No results for input(s): LIPASE, AMYLASE in the last 168 hours. No results for input(s): AMMONIA in the last 168 hours. Coagulation Profile: No results for input(s): INR, PROTIME in the last 168 hours. Cardiac Enzymes: No results for input(s): CKTOTAL, CKMB, CKMBINDEX, TROPONINI in the last 168 hours. BNP (last 3 results) No results for input(s): PROBNP in the last 8760 hours. HbA1C: No results for input(s): HGBA1C in the last 72 hours. CBG:  Recent Labs Lab 06/13/16 1252 06/14/16 0745 06/14/16 2058 06/15/16 0850 06/16/16 0835  GLUCAP 90 97 117* 116* 116*   Lipid Profile: No results for input(s): CHOL, HDL, LDLCALC, TRIG, CHOLHDL, LDLDIRECT in the last 72 hours. Thyroid Function Tests: No results for input(s): TSH, T4TOTAL, FREET4, T3FREE, THYROIDAB in the last 72 hours. Anemia Panel: No results for input(s): VITAMINB12, FOLATE, FERRITIN, TIBC, IRON, RETICCTPCT in the last 72 hours. Sepsis Labs:  Recent Labs Lab 06/13/16 0310 06/13/16 0915 06/15/16 0501  PROCALCITON  --  <0.10 <0.10  LATICACIDVEN 0.8  --   --     Recent Results (from the past 240 hour(s))  Urine culture     Status: Abnormal   Collection Time: 06/06/16  4:30 PM  Result Value Ref Range Status   Specimen Description URINE, RANDOM  Final   Special Requests NONE  Final   Culture >=100,000 COLONIES/mL ENTEROCOCCUS FAECIUM (A)  Final   Report Status 06/08/2016 FINAL  Final   Organism ID, Bacteria ENTEROCOCCUS FAECIUM (A)  Final      Susceptibility   Enterococcus faecium - MIC*    AMPICILLIN >=32 RESISTANT Resistant     LEVOFLOXACIN >=8 RESISTANT Resistant     NITROFURANTOIN 256 RESISTANT Resistant     VANCOMYCIN <=0.5  SENSITIVE Sensitive     LINEZOLID 2 SENSITIVE Sensitive     * >=100,000 COLONIES/mL ENTEROCOCCUS FAECIUM  Urine culture     Status: Abnormal   Collection Time: 06/13/16  1:02 AM  Result Value Ref Range Status   Specimen Description URINE, CATHETERIZED  Final   Special Requests NONE  Final   Culture >=100,000 COLONIES/mL ENTEROCOCCUS FAECIUM (A)  Final   Report Status 06/15/2016 FINAL  Final   Organism ID, Bacteria ENTEROCOCCUS FAECIUM (A)  Final      Susceptibility   Enterococcus faecium - MIC*    AMPICILLIN >=32 RESISTANT Resistant     LEVOFLOXACIN >=8 RESISTANT Resistant     NITROFURANTOIN 256 RESISTANT Resistant     VANCOMYCIN <=0.5 SENSITIVE Sensitive     * >=100,000 COLONIES/mL ENTEROCOCCUS FAECIUM  Culture, blood (Routine X 2) w Reflex to ID Panel     Status: None (Preliminary result)   Collection Time: 06/13/16  3:05 AM  Result Value Ref Range Status   Specimen Description BLOOD RIGHT ARM  Final   Special Requests AEROBIC BOTTLE ONLY 5ML  Final   Culture NO GROWTH 3 DAYS  Final   Report Status PENDING  Incomplete  Culture, blood (Routine X 2) w Reflex to ID Panel     Status: None (Preliminary result)   Collection Time: 06/13/16  3:10 AM  Result Value Ref Range Status   Specimen Description BLOOD RIGHT HAND  Final   Special Requests AEROBIC BOTTLE ONLY 5ML  Final   Culture NO GROWTH 3 DAYS  Final   Report Status PENDING  Incomplete  Respiratory Panel by PCR     Status: None   Collection Time: 06/13/16  2:26 PM  Result Value Ref Range Status   Adenovirus NOT DETECTED NOT DETECTED Final   Coronavirus 229E NOT DETECTED NOT DETECTED Final   Coronavirus HKU1 NOT DETECTED NOT DETECTED Final   Coronavirus NL63 NOT DETECTED NOT DETECTED Final   Coronavirus OC43 NOT DETECTED NOT DETECTED Final   Metapneumovirus NOT DETECTED NOT DETECTED Final   Rhinovirus / Enterovirus NOT DETECTED NOT DETECTED Final   Influenza A NOT DETECTED NOT DETECTED Final   Influenza B NOT DETECTED NOT  DETECTED Final   Parainfluenza Virus 1 NOT DETECTED NOT DETECTED Final   Parainfluenza Virus 2 NOT DETECTED NOT DETECTED Final   Parainfluenza Virus 3 NOT DETECTED NOT DETECTED Final   Parainfluenza Virus 4 NOT DETECTED NOT DETECTED Final   Respiratory Syncytial Virus NOT DETECTED NOT DETECTED Final   Bordetella pertussis NOT DETECTED NOT DETECTED Final   Chlamydophila pneumoniae NOT DETECTED NOT DETECTED Final   Mycoplasma pneumoniae NOT DETECTED NOT DETECTED Final  MRSA PCR Screening     Status: Abnormal   Collection Time: 06/13/16  2:26 PM  Result Value Ref Range Status   MRSA by PCR POSITIVE (A) NEGATIVE Final    Comment:        The GeneXpert MRSA Assay (FDA approved for NASAL specimens only), is one component of a comprehensive MRSA colonization surveillance program. It is not intended to diagnose MRSA infection nor to guide or monitor treatment for MRSA infections. RESULT CALLED TO, READ BACK BY AND VERIFIED WITH: [facility] RN 16:15 06/13/16 ([email or web address])          Radiology Studies: No results found.      Scheduled Meds: . aspirin  300 mg Rectal Daily  . benztropine  1 mg Oral BID  . ceFEPime (MAXIPIME) IV  1 g Intravenous Q8H  . Chlorhexidine Gluconate Cloth  6 each Topical Q0600  . cholecalciferol  1,000 Units Oral Daily  . cycloSPORINE  1 drop Both Eyes BID  . enoxaparin (LOVENOX) injection  40 mg Subcutaneous Daily  . feeding supplement (ENSURE ENLIVE)  237 mL Oral BID BM  . fluPHENAZine  10 mg Oral BID  . fluticasone  1 spray Each Nare BID  . lisinopril  5 mg Oral Daily  . lithium carbonate  300 mg Oral BID WC  . mouth rinse  15 mL Mouth Rinse q12n4p  . Melatonin  3 mg Oral QHS  . mupirocin ointment   Nasal BID  . polyvinyl alcohol  1 drop Both Eyes BID  . risperiDONE  3 mg Oral BID  . sertraline  150 mg Oral Daily  . sodium chloride flush  3 mL Intravenous Q12H  . vancomycin  1,250 mg Intravenous Q24H   Continuous Infusions: . sodium chloride  LOS: 3 days     Cordelia Poche [facility] 06/16/2016, 11:23 AM Pager: [phone]  If 7PM-7AM, please contact night-coverage [email or web address] Password [email or web address] 06/16/2016, 11:23 AM

## 2016-06-16 NOTE — Progress Notes (Signed)
CRITICAL VALUE ALERT  Critical value received:  Vanc Trough 26   Date of notification:  06/16/16  Time of notification:  9:18  Critical value read back:Yes.    Nurse who received alert:  Rosalio Loud RN  MD notified (1st page):  Dr. Lonny Prude   Time of first page:  09:20

## 2016-06-16 NOTE — Progress Notes (Addendum)
5pm  Spoke with [employer] rep at 4:30pm who states she was processing information for this pt  Still no PASAR assigned to pt- can NOT DC to SNF without PASAR approval- weekend CSW to check for approval prior to DC.  12:15pm Pt son, Heidi Blevins, is agreeable to SNF stay- would like [facility]- plan for DC to [facility] 10/21.  Awaiting PASAR approval- requested clinicals sent- fax confirmation received  Jorge Ny, [place] Clinical Social Worker [phone]

## 2016-06-16 NOTE — Progress Notes (Signed)
Pharmacy Antibiotic Note  Heidi Blevins is a 65 y.o. female admitted on 06/12/2016 with pneumonia and UTI.  Pharmacy has been consulted for vancomycin dosing. Pt is afebrile, WBC 12.9, SCr 0.98  10/20 VT 26 (goal 15 -20) on vanc 750mg  IV Q12H  Plan: 1. Adjust vancomycin dose to 1250 mg every 24 hours  2. F/u renal fxn, clinical picture, trough at SS  Height: 5\' 6"  (167.6 cm) Weight: 145 lb 11.2 oz (66.1 kg) IBW/kg (Calculated) : 59.3  Temp (24hrs), Avg:98.5 F (36.9 C), Min:98.1 F (36.7 C), Max:98.8 F (37.1 C)   Recent Labs Lab 06/12/16 2242 06/13/16 0310 06/14/16 0716 06/15/16 1425 06/16/16 0755  WBC 13.1* 14.3* 13.4* 14.7* 12.9*  CREATININE 1.23* 1.12* 1.03* 0.88 0.98  LATICACIDVEN  --  0.8  --   --   --   VANCOTROUGH  --   --   --   --  26*    Estimated Creatinine Clearance: 53.6 mL/min (by C-G formula based on SCr of 0.98 mg/dL).    Allergies  Allergen Reactions  . Sulfonamide Derivatives Hives         Antimicrobials this admission:  Vanc 10/17 >>   Cefepime 10/17 >>  CTX x 1 10/17  Dose adjustments this admission: 10/20 VT 26 on Vancomycin 750 mg IV Q12H, SCr 0.98   Microbiology results: 10/17 BCx: px 10/17 UCx: E. Faecium S to vancomycin R to PCN  10/10 Urine: E. faecium    Thank you for allowing pharmacy to be a part of this patient's care.  Pierson Student 06/16/2016 10:09 AM

## 2016-06-16 NOTE — Care Management Important Message (Signed)
Important Message  Patient Details  Name: Heidi Blevins MRN: [id number] Date of Birth: 06-13-1951   Medicare Important Message Given:  Yes    Carles Collet, RN 06/16/2016, 1:54 PM

## 2016-06-17 ENCOUNTER — Inpatient Hospital Stay (HOSPITAL_COMMUNITY): Payer: Medicare Other

## 2016-06-17 DIAGNOSIS — E872 Acidosis: Secondary | ICD-10-CM

## 2016-06-17 LAB — BLOOD GAS, ARTERIAL
Acid-base deficit: 2.6 mmol/L — ABNORMAL HIGH (ref 0.0–2.0)
Bicarbonate: 21.9 mmol/L (ref 20.0–28.0)
Drawn by: 103701
FIO2: 100
O2 Saturation: 99.7 %
PATIENT TEMPERATURE: 99.6
PCO2 ART: 40.3 mmHg (ref 32.0–48.0)
PH ART: 7.358 (ref 7.350–7.450)
PO2 ART: 380 mmHg — AB (ref 83.0–108.0)

## 2016-06-17 LAB — BASIC METABOLIC PANEL
Anion gap: 8 (ref 5–15)
BUN: 11 mg/dL (ref 6–20)
CALCIUM: 10.9 mg/dL — AB (ref 8.9–10.3)
CO2: 17 mmol/L — AB (ref 22–32)
CREATININE: 0.91 mg/dL (ref 0.44–1.00)
Chloride: 123 mmol/L — ABNORMAL HIGH (ref 101–111)
GFR calc Af Amer: >60 mL/min (ref >60)
GFR calc non Af Amer: >60 mL/min (ref >60)
GLUCOSE: 102 mg/dL — AB (ref 65–99)
Potassium: 4.7 mmol/L (ref 3.5–5.1)
Sodium: 148 mmol/L — ABNORMAL HIGH (ref 135–145)

## 2016-06-17 LAB — LITHIUM LEVEL: Lithium Lvl: 0.45 mmol/L — ABNORMAL LOW (ref 0.60–1.20)

## 2016-06-17 LAB — PROCALCITONIN

## 2016-06-17 LAB — GLUCOSE, CAPILLARY: Glucose-Capillary: 93 mg/dL (ref 65–99)

## 2016-06-17 MED ORDER — METOPROLOL TARTRATE 25 MG PO TABS
25.0000 mg | ORAL_TABLET | Freq: Two times a day (BID) | ORAL | Status: DC
  Administered 2016-06-17 – 2016-06-18 (×3): 25 mg via ORAL
  Filled 2016-06-17 (×3): qty 1

## 2016-06-17 NOTE — Significant Event (Addendum)
Rapid Response Event Note  Overview: Time Called: 1116 Arrival Time: [id number] Event Type: Neurologic, Respiratory  Initial Focused Assessment: Called by RNs, per RNS, patient was having changes to her neuro status and her respiratory status, TRH MD was already in to the patient, ABG was ordered.  Upon assessment, patient was responsive, alert, follow commands in all extremities, no droop, no sensory loss noted, pupils reactive. VS stable, patient able to move all extremities. No respiratory upon my assessment, good air movement in all lung fields  Interventions: -- ABG was drawn, WNL -- MD was on the unit, I reviewed the patient with him, patient was not in respiratory distress, she was placed on NRB for sats in the 70s prior to my arrival, but her ABG revealed PaO2 greater than 200 and sat of 99. I instructed RN to wean her oxygen down  Plan of Care (if not transferred): -- instructed RN to call me if things change. Patient is stable -- called for a follow up later in the afternoon, per RN, patient has improved as the day has gone on and having her family at the bedside has helped her mental status  Event Summary: Name of Physician Notified: Dr. Teryl Lucy was at bedside and in on the unit at the time    Outcome: Stayed in room and stabalized  Event End Time: 1215  Kijuan Gallicchio R

## 2016-06-17 NOTE — Progress Notes (Signed)
PROGRESS NOTE    Heidi Blevins  [id number] DOB: 1951-07-30 DOA: 06/12/2016 PCP: [name], MD   Brief Narrative: Heidi Blevins is a 65 y.o. female with a history of hypertension, depression, anxiety, schizoaffective disorder, hypercalcemia, bifasicular block, tobacco abuse, stroke. She presented with altered mental status which has been unresolved from previous admission for UTI. She was initially started on ceftriaxone but transition to vancomycin for history of enterococcus faecium.   Assessment & Plan:   Principal Problem:   UTI (urinary tract infection) Active Problems:   Hypercalcemia   Dehydration   Acute encephalopathy   Cerebrovascular disease   Schizoaffective disorder (HCC)   Bifascicular block   AKI (acute kidney injury) ([place])   Essential hypertension   Acute metabolic encephalopathy Likely secondary to untreated UTI vs dehydration vs aspiration pneumonitis vs combination. Urine culture positive for enterococcus faecium. Worsened today. ?iatrogenic from fluids -discontinue IV fluids -lithium level -ABG -discontinue Levaquin as this was added and could be contributing  Non-anion gap acidosis Worsening. Possibly worsening mental status. Possible RTA. No diarrhea charted. -ABG as above -urine studies (anion gap)  UTI Enterococcus faecium sensitive to vancomycin -continue vancomycin as above  LLL opacity Concerning for HCAP/aspiration pneumonia. Viral respiratory panel negative -discontinue cefepime -discontinue Levaquin. Patient has received 5 days of treatment.  AKI Improved -continue IVF  Hypercalcemia ?Chronic. Recently worked up. Recent PTH elevated. Appears likely that this is secondary to chronic lithium usage -monitor calcium  Schizoaffective disorder -continue cogentin, prolixin, lithium, Risperdal -continue zoloft   DVT prophylaxis: Lovenox subq Code Status: Full code Family Communication: None at bedside Disposition  Plan: Discharge pending improvement in mental status   Consultants:   None  Procedures:  None  Antimicrobials:   Ceftriaxone (10/17)  Cefepime (10/17>>10/18)  Vancomycin (10/17>>  Levaquin (10/20>>10/21)   Subjective: Patient not as responsive today. Responds to voice. Not following commands.  Objective: Vitals:   06/16/16 2158 06/17/16 0024 06/17/16 0606 06/17/16 1112  BP: (!) 176/96 (!) 149/93 (!) 168/98 (!) 159/98  Pulse: 94 94 90 92  Resp: 18 17 17    Temp:  99.2 F (37.3 C) 99.6 F (37.6 C)   TempSrc:      SpO2: 99% 98% 97% 100%  Weight:      Height:        Intake/Output Summary (Last 24 hours) at 06/17/16 1124 Last data filed at 06/17/16 0500  Gross per 24 hour  Intake          1518.75 ml  Output                1 ml  Net          1517.75 ml   Filed Weights   06/13/16 1443  Weight: 66.1 kg (145 lb 11.2 oz)    Examination:  General exam: Appears calm and comfortable. Respiratory system: Clear to auscultation. Respiratory effort increased. Cardiovascular system: S1 & S2 heard, RRR. No murmurs, rubs, gallops or clicks. Gastrointestinal system: Abdomen is nondistended, soft and nontender. No organomegaly or masses felt. Normal bowel sounds heard. Central nervous system: Alert. Nor oriented today Extremities: No edema. No calf tenderness Skin: No cyanosis. No rashes Psychiatry: Could not assess    Data Reviewed: I have personally reviewed following labs and imaging studies  CBC:  Recent Labs Lab 06/12/16 2242 06/13/16 0310 06/14/16 0716 06/15/16 1425 06/16/16 0755  WBC 13.1* 14.3* 13.4* 14.7* 12.9*  NEUTROABS 9.8*  --   --   --   --  HGB 12.1 12.1 10.4* 11.2* 11.3*  HCT 36.1 36.8 32.6* 34.3* 34.9*  MCV 88.9 88.9 90.3 90.0 90.4  PLT 323 401* 377 366 [id number]   Basic Metabolic Panel:  Recent Labs Lab 06/13/16 0310 06/14/16 0716 06/15/16 1425 06/16/16 0755 06/17/16 0705  NA 144 147* 148* 149* 148*  K 3.5 3.5 3.8 4.0 4.7  CL 110  121* 123* 123* 123*  CO2 22 18* 19* 19* 17*  GLUCOSE 108* 103* 127* 113* 102*  BUN 12 11 9 8 11   CREATININE 1.12* 1.03* 0.88 0.98 0.91  CALCIUM 11.2* 11.0* 11.1* 11.0* 10.9*  MG  --  1.9  --   --   --    GFR: Estimated Creatinine Clearance: 57.7 mL/min (by C-G formula based on SCr of 0.91 mg/dL). Liver Function Tests:  Recent Labs Lab 06/12/16 2327  AST 35  ALT 26  ALKPHOS 124  BILITOT 1.1  PROT 7.3  ALBUMIN 3.5   No results for input(s): LIPASE, AMYLASE in the last 168 hours. No results for input(s): AMMONIA in the last 168 hours. Coagulation Profile: No results for input(s): INR, PROTIME in the last 168 hours. Cardiac Enzymes: No results for input(s): CKTOTAL, CKMB, CKMBINDEX, TROPONINI in the last 168 hours. BNP (last 3 results) No results for input(s): PROBNP in the last 8760 hours. HbA1C: No results for input(s): HGBA1C in the last 72 hours. CBG:  Recent Labs Lab 06/14/16 0745 06/14/16 2058 06/15/16 0850 06/16/16 0835 06/17/16 0811  GLUCAP 97 117* 116* 116* 93   Sepsis Labs:  Recent Labs Lab 06/13/16 0310 06/13/16 0915 06/15/16 0501 06/17/16 0705  PROCALCITON  --  <0.10 <0.10 <0.10  LATICACIDVEN 0.8  --   --   --     Recent Results (from the past 240 hour(s))  Urine culture     Status: Abnormal   Collection Time: 06/13/16  1:02 AM  Result Value Ref Range Status   Specimen Description URINE, CATHETERIZED  Final   Special Requests NONE  Final   Culture >=100,000 COLONIES/mL ENTEROCOCCUS FAECIUM (A)  Final   Report Status 06/15/2016 FINAL  Final   Organism ID, Bacteria ENTEROCOCCUS FAECIUM (A)  Final      Susceptibility   Enterococcus faecium - MIC*    AMPICILLIN >=32 RESISTANT Resistant     LEVOFLOXACIN >=8 RESISTANT Resistant     NITROFURANTOIN 256 RESISTANT Resistant     VANCOMYCIN <=0.5 SENSITIVE Sensitive     * >=100,000 COLONIES/mL ENTEROCOCCUS FAECIUM  Culture, blood (Routine X 2) w Reflex to ID Panel     Status: None (Preliminary  result)   Collection Time: 06/13/16  3:05 AM  Result Value Ref Range Status   Specimen Description BLOOD RIGHT ARM  Final   Special Requests AEROBIC BOTTLE ONLY 5ML  Final   Culture NO GROWTH 3 DAYS  Final   Report Status PENDING  Incomplete  Culture, blood (Routine X 2) w Reflex to ID Panel     Status: None (Preliminary result)   Collection Time: 06/13/16  3:10 AM  Result Value Ref Range Status   Specimen Description BLOOD RIGHT HAND  Final   Special Requests AEROBIC BOTTLE ONLY 5ML  Final   Culture NO GROWTH 3 DAYS  Final   Report Status PENDING  Incomplete  Respiratory Panel by PCR     Status: None   Collection Time: 06/13/16  2:26 PM  Result Value Ref Range Status   Adenovirus NOT DETECTED NOT DETECTED Final   Coronavirus 229E NOT DETECTED  NOT DETECTED Final   Coronavirus HKU1 NOT DETECTED NOT DETECTED Final   Coronavirus NL63 NOT DETECTED NOT DETECTED Final   Coronavirus OC43 NOT DETECTED NOT DETECTED Final   Metapneumovirus NOT DETECTED NOT DETECTED Final   Rhinovirus / Enterovirus NOT DETECTED NOT DETECTED Final   Influenza A NOT DETECTED NOT DETECTED Final   Influenza B NOT DETECTED NOT DETECTED Final   Parainfluenza Virus 1 NOT DETECTED NOT DETECTED Final   Parainfluenza Virus 2 NOT DETECTED NOT DETECTED Final   Parainfluenza Virus 3 NOT DETECTED NOT DETECTED Final   Parainfluenza Virus 4 NOT DETECTED NOT DETECTED Final   Respiratory Syncytial Virus NOT DETECTED NOT DETECTED Final   Bordetella pertussis NOT DETECTED NOT DETECTED Final   Chlamydophila pneumoniae NOT DETECTED NOT DETECTED Final   Mycoplasma pneumoniae NOT DETECTED NOT DETECTED Final  MRSA PCR Screening     Status: Abnormal   Collection Time: 06/13/16  2:26 PM  Result Value Ref Range Status   MRSA by PCR POSITIVE (A) NEGATIVE Final    Comment:        The GeneXpert MRSA Assay (FDA approved for NASAL specimens only), is one component of a comprehensive MRSA colonization surveillance program. It is  not intended to diagnose MRSA infection nor to guide or monitor treatment for MRSA infections. RESULT CALLED TO, READ BACK BY AND VERIFIED WITH: [facility] RN 16:15 06/13/16 ([email or web address])          Radiology Studies: No results found.      Scheduled Meds: . aspirin  300 mg Rectal Daily  . benztropine  1 mg Oral BID  . Chlorhexidine Gluconate Cloth  6 each Topical Q0600  . cholecalciferol  1,000 Units Oral Daily  . cycloSPORINE  1 drop Both Eyes BID  . enoxaparin (LOVENOX) injection  40 mg Subcutaneous Daily  . feeding supplement (ENSURE ENLIVE)  237 mL Oral BID BM  . fluPHENAZine  10 mg Oral BID  . fluticasone  1 spray Each Nare BID  . lisinopril  5 mg Oral Daily  . lithium carbonate  300 mg Oral BID WC  . mouth rinse  15 mL Mouth Rinse q12n4p  . Melatonin  3 mg Oral QHS  . metoprolol tartrate  25 mg Oral BID  . mupirocin ointment   Nasal BID  . polyvinyl alcohol  1 drop Both Eyes BID  . risperiDONE  3 mg Oral BID  . sertraline  150 mg Oral Daily  . sodium chloride flush  3 mL Intravenous Q12H  . vancomycin  1,250 mg Intravenous Q24H   Continuous Infusions:     LOS: 4 days     Cordelia Poche [facility] 06/17/2016, 11:24 AM Pager: [phone]  If 7PM-7AM, please contact night-coverage [email or web address] Password [email or web address] 06/17/2016, 11:24 AM

## 2016-06-18 LAB — CULTURE, BLOOD (ROUTINE X 2)
CULTURE: NO GROWTH
Culture: NO GROWTH

## 2016-06-18 LAB — BASIC METABOLIC PANEL
ANION GAP: 3 — AB (ref 5–15)
BUN: 13 mg/dL (ref 6–20)
CALCIUM: 10.4 mg/dL — AB (ref 8.9–10.3)
CO2: 24 mmol/L (ref 22–32)
CREATININE: 0.83 mg/dL (ref 0.44–1.00)
Chloride: 120 mmol/L — ABNORMAL HIGH (ref 101–111)
Glucose, Bld: 107 mg/dL — ABNORMAL HIGH (ref 65–99)
Potassium: 3.6 mmol/L (ref 3.5–5.1)
SODIUM: 147 mmol/L — AB (ref 135–145)

## 2016-06-18 LAB — GLUCOSE, CAPILLARY: GLUCOSE-CAPILLARY: 151 mg/dL — AB (ref 65–99)

## 2016-06-18 MED ORDER — ASPIRIN EC 81 MG PO TBEC
81.0000 mg | DELAYED_RELEASE_TABLET | Freq: Every day | ORAL | Status: DC
  Administered 2016-06-18: 81 mg via ORAL
  Filled 2016-06-18: qty 1

## 2016-06-18 MED ORDER — AMOXICILLIN-POT CLAVULANATE 875-125 MG PO TABS: 1.0000 | ORAL_TABLET | Freq: Two times a day (BID) | ORAL | Status: DC

## 2016-06-18 MED ORDER — LISINOPRIL 10 MG PO TABS: 10.0000 mg | ORAL_TABLET | Freq: Every day | ORAL | Status: DC

## 2016-06-18 MED ORDER — VANCOMYCIN HCL 10 G IV SOLR: 1250.0000 mg | Freq: Once | INTRAVENOUS | Status: AC

## 2016-06-18 MED ORDER — AMOXICILLIN-POT CLAVULANATE 500-125 MG PO TABS: 1.0000 | ORAL_TABLET | Freq: Three times a day (TID) | ORAL | Status: DC

## 2016-06-18 MED ORDER — INFLUENZA VAC SPLIT QUAD 0.5 ML IM SUSY: 0.5000 mL | PREFILLED_SYRINGE | INTRAMUSCULAR | Status: DC

## 2016-06-18 MED ORDER — LISINOPRIL 10 MG PO TABS
10.0000 mg | ORAL_TABLET | Freq: Every day | ORAL | Status: DC
  Administered 2016-06-18: 10 mg via ORAL
  Filled 2016-06-18: qty 1

## 2016-06-18 MED ORDER — AMOXICILLIN-POT CLAVULANATE 875-125 MG PO TABS
1.0000 | ORAL_TABLET | Freq: Two times a day (BID) | ORAL | Status: DC
  Administered 2016-06-18: 1 via ORAL
  Filled 2016-06-18: qty 1

## 2016-06-18 MED ORDER — PNEUMOCOCCAL VAC POLYVALENT 25 MCG/0.5ML IJ INJ: 0.5000 mL | INJECTION | INTRAMUSCULAR | Status: DC

## 2016-06-18 NOTE — Discharge Summary (Signed)
Physician Discharge Summary  Heidi Blevins [id number] DOB: 06/05/1951 DOA: 06/12/2016  PCP: [name], MD  Admit date: 06/12/2016 Discharge date: 06/18/2016  Admitted From: SNF Disposition:  SNF  Recommendations for Outpatient Follow-up:  1. Follow up with PCP in 1 week 2. Please obtain BMP in 2-3 days to recheck sodium 3. Vancomycin for one dose on 10/23 to complete 7 day course 4. Continued treatment of presumed aspiration pneumonia   Discharge Condition: Stable CODE STATUS: Full code  Brief/Interim Summary:  HPI written by Dr. Ivor Costa on 06/13/2016  HPI: Heidi Blevins is a 65 y.o. female with medical history significant of hypertension, depression, anxiety, schizoaffective disorder, hypercalcemia, bifasicular block, tobacco abuse, stroke, who presents with AMS.  Per pt's sons, patient has not been able to eat well in the past 8 days.Ppatient is noted to be "not herself" by staff in SNF. Staff reports patient usually talkative, but not so now. She is more confused than her baseline. She is not actively coughing, nauseated. She does not seem to have abdominal pain, diarrhea, chest pain or shortness of breath. Not sure if she has any symptoms of UTI. She moves all extremities.  ED Course: pt was found to have posterior urinalysis with large amount of leukocytes, WBC 13.1, lactate 0.8, AKI with Cre 1.23, Ca 11.3, temperature normal, no tachycardia, no tachypnea, negative CT head for acute intracranial abnormalities. X-ray showed mild peirbronchial thickening. Pt is admitted to tele bed as inpt.    Hospital course:  Acute metabolic encephalopathy Patient presented with this complaint from the rehab facility. Concern this was secondary to infection. Patient with apparent pneumonia, likely secondary to aspiration and UTI. Patient also dehydrated. Patient started on Vancomycin and cefepime (patient received one dose of ceftriaxone) to treat both UTI (presumed  enterococcus faecium at the time from previous culture) and pneumonia. IV fluids given for dehydration. Patient's mental status improved, however, she had worsened mental status on 10/21 with rapid response called. Patient was found to have a short episode of acute respiratory and was given non-rebreather for a few minutes with almost immediate improvement, no long needing non-rebreather (question as to whether it was a false alarm from false reading). Patient was initially groggy that morning, but improved within an hour. ABG obtained and was unremarkable. Levaquin had been started and was discontinued. Augmentin started to complete course for aspiration pneumonia. On day of discharge, patient very alert and more oriented than she had been since admission.   Non-anion gap acidosis Likely secondary to IV fluids in setting of RTA. Improved with cessation of IV fluids.  UTI Enterococcus faecium sensitive to vancomycin isolated on urine culture. She received 6 days of vancomycin before discharge. To receive one more day on 10/23 around 1400. Confirmed that this can be done as out patient at SNF  LLL opacity Concerning for HCAP/aspiration pneumonia. Viral respiratory panel negative. Patient given one dose of ceftriaxone, followed be cefepime. She was transitioned to Levaquin which may have cause her to have worsening mental status, so that was switched to augmentin to complete a 7 day course  AKI Improved with IV fluids  Hypercalcemia ?Chronic. Recently worked up. Recent PTH elevated. Appears likely that this is secondary to chronic lithium usage. Calcium remained stable and improved slightly.  Schizoaffective disorder Continued cogentin, prolixin, lithium, Risperdal, Zoloft   Discharge Diagnoses:  Principal Problem:   UTI (urinary tract infection) Active Problems:   Hypercalcemia   Dehydration   Acute encephalopathy   Cerebrovascular  disease   Schizoaffective disorder ([place])    Bifascicular block   AKI (acute kidney injury) ([place])   Essential hypertension    Discharge Instructions     Medication List    STOP taking these medications   cephALEXin 500 MG capsule Commonly known as:  KEFLEX   HYDROcodone-acetaminophen 5-325 MG tablet Commonly known as:  NORCO/VICODIN   ondansetron 4 MG disintegrating tablet Commonly known as:  ZOFRAN ODT     TAKE these medications   amoxicillin-clavulanate 875-125 MG tablet Commonly known as:  AUGMENTIN Take 1 tablet by mouth every 12 (twelve) hours. Last dose 06/19/2016.   aspirin 81 MG EC tablet Take 1 tablet (81 mg total) by mouth daily.   benzocaine-resorcinol 5-2 % vaginal cream Commonly known as:  VAGISIL Place 1 application vaginally as needed for itching.   benztropine 1 MG tablet Commonly known as:  COGENTIN Take 1 mg by mouth 2 (two) times daily.   cholecalciferol 400 units Tabs tablet Commonly known as:  VITAMIN D Take 400 Units by mouth daily.   Cranberry 500 MG Tabs Take 1 tablet by mouth 2 (two) times daily.   cycloSPORINE 0.05 % ophthalmic emulsion Commonly known as:  RESTASIS Place 1 drop into both eyes 2 (two) times daily.   fluPHENAZine 10 MG tablet Commonly known as:  PROLIXIN Take 10 mg by mouth 2 (two) times daily.   fluticasone 50 MCG/ACT nasal spray Commonly known as:  FLONASE Place 1 spray into both nostrils 2 (two) times daily.   guaifenesin 100 MG/5ML syrup Commonly known as:  ROBITUSSIN Take 200 mg by mouth every 6 (six) hours as needed for cough.   hydrOXYzine 50 MG tablet Commonly known as:  ATARAX/VISTARIL Take 100 mg by mouth 2 (two) times daily.   ibuprofen 200 MG tablet Commonly known as:  ADVIL,MOTRIN Take 400 mg by mouth every 6 (six) hours as needed for moderate pain.   lisinopril 10 MG tablet Commonly known as:  PRINIVIL,ZESTRIL Take 1 tablet (10 mg total) by mouth daily. What changed:  medication strength  how much to take   lithium carbonate 300  MG capsule Take 300 mg by mouth 2 (two) times daily with a meal.   Melatonin 3 MG Tabs Take 3 mg by mouth at bedtime.   metoprolol tartrate 25 MG tablet Commonly known as:  LOPRESSOR Take 1 tablet (25 mg total) by mouth 2 (two) times daily.   NUTRITIONAL SHAKE PLUS PO Take 1 Container by mouth 3 (three) times daily.   risperiDONE 3 MG tablet Commonly known as:  RISPERDAL Take 3 mg by mouth 2 (two) times daily.   sertraline 100 MG tablet Commonly known as:  ZOLOFT Take 150 mg by mouth daily.   SYSTANE BALANCE 0.6 % Soln Generic drug:  Propylene Glycol Place 1 drop into both eyes 2 (two) times daily.   vancomycin 1,250 mg in sodium chloride 0.9 % 250 mL Inject 1,250 mg into the vein once. Give one dose on 10/23 at 1400. Last dose is on 10/23. Start taking on:  06/19/2016       Allergies  Allergen Reactions  . Sulfonamide Derivatives Hives         Consultations:  None   Procedures/Studies: Dg Chest 2 View  Result Date: 06/12/2016 CLINICAL DATA:  Initial evaluation for acute altered mental status. The EXAM: CHEST  2 VIEW COMPARISON:  Prior radiograph from 06/06/2016. FINDINGS: Study somewhat limited by overlying arm on lateral projection. Cardiomegaly is stable.  Mediastinal silhouette  unchanged. Lungs hypoinflated. Mild scattered peribronchial thickening. No focal infiltrates. No overt pulmonary edema. No pleural effusion. No pneumothorax. Scoliosis with Harrington rod fixation noted. No acute osseous abnormality. Osteopenia noted. IMPRESSION: 1. Mild scattered peribronchial thickening, nonspecific, but could reflect sequela of acute bronchiolitis in the correct clinical setting. Mild pulmonary interstitial congestion could also have this appearance. 2. Stable cardiomegaly. Electronically Signed   By: Jeannine Boga M.D.   On: 06/12/2016 23:41   Dg Chest 2 View  Result Date: 06/06/2016 CLINICAL DATA:  Weakness EXAM: CHEST  2 VIEW COMPARISON:  05/03/2016  FINDINGS: Cardiomediastinal silhouette is stable. Dextroscoliosis thoracic spine again noted. Metallic rod in thoracic spine is stable. Limited study by poor inspiration. Streaky right basilar atelectasis or infiltrate. No pulmonary edema. IMPRESSION: Dextroscoliosis thoracic spine again noted. Metallic rod in thoracic spine is stable. Limited study by poor inspiration. Streaky right basilar atelectasis or infiltrate. No pulmonary edema. Electronically Signed   By: Lahoma Crocker M.D.   On: 06/06/2016 17:09   Ct Head Wo Contrast  Result Date: 06/13/2016 CLINICAL DATA:  Acute onset of altered mental status. Initial encounter. EXAM: CT HEAD WITHOUT CONTRAST TECHNIQUE: Contiguous axial images were obtained from the base of the skull through the vertex without intravenous contrast. COMPARISON:  CT of the head performed 05/03/2016, and MRI of the brain performed 05/04/2016 FINDINGS: Brain: No evidence of acute infarction, hemorrhage, hydrocephalus, extra-axial collection or mass lesion/mass effect. Mild prominence of the ventricles and sulci suggests mild cortical volume loss. Mild periventricular and subcortical white matter change likely reflects small vessel ischemic microangiopathy. A chronic lacunar infarct is noted at the right basal ganglia. The brainstem and fourth ventricle are within normal limits. The cerebral hemispheres demonstrate grossly normal gray-white differentiation. No mass effect or midline shift is seen. Vascular: No hyperdense vessel or unexpected calcification. Skull: There is no evidence of fracture; visualized osseous structures are unremarkable in appearance. Sinuses/Orbits: The visualized portions of the orbits are within normal limits. The paranasal sinuses and mastoid air cells are well-aerated. Other: No significant soft tissue abnormalities are seen. IMPRESSION: 1. No acute intracranial pathology seen on CT. 2. Mild cortical volume loss and scattered small vessel ischemic  microangiopathy. 3. Chronic lacunar infarct at the right basal ganglia. Electronically Signed   By: Garald Balding M.D.   On: 06/13/2016 00:13   Dg Chest Port 1 View  Result Date: 06/17/2016 CLINICAL DATA:  Dyspnea.  Recent UTI and fever.  Possible pneumonia. EXAM: PORTABLE CHEST 1 VIEW COMPARISON:  06/12/2016 FINDINGS: Patient is rotated to the left. Lungs are adequately inflated with mild stable elevation the right hemidiaphragm. No evidence of effusion. Mild prominence of the perihilar markings likely due to vascular congestion. Mild stable cardiomegaly. Moderate curvature of the spine convex right with stabilization hardware unchanged. IMPRESSION: Mild stable cardiomegaly with findings suggesting mild vascular congestion. Electronically Signed   By: Marin Olp M.D.   On: 06/17/2016 14:19      Subjective: Patient very responsive today. States no pain. Said "thank you" as I left the room.  Discharge Exam: Vitals:   06/17/16 2124 06/18/16 0505  BP: (!) 162/85 (!) 155/76  Pulse: 69 68  Resp: 19 20  Temp: 98.2 F (36.8 C) 98.6 F (37 C)   Vitals:   06/17/16 1148 06/17/16 1336 06/17/16 2124 06/18/16 0505  BP: (!) 161/93 (!) 157/79 (!) 162/85 (!) 155/76  Pulse: 87 66 69 68  Resp:  20 19 20   Temp:  98.5 F (36.9 C) 98.2 F (  36.8 C) 98.6 F (37 C)  TempSrc:  Oral Oral Oral  SpO2: 100% 100% 98% 100%  Weight:      Height:        General exam: Appears calm and comfortable. Respiratory system: Clear to auscultation. No wheezing. Normal respiratory effort. Cardiovascular system: S1 & S2 heard, RRR. No murmurs, rubs, gallops or clicks. Gastrointestinal system: Abdomen is nondistended, soft and nontender. No organomegaly or masses felt. Normal bowel sounds heard. Central nervous system: Alert. Oriented to person only, place and year/month. Responds appropriately to commands. Extremities: No edema. No calf tenderness Skin: No cyanosis. No rashes Psychiatry: Flat affect  The  results of significant diagnostics from this hospitalization (including imaging, microbiology, ancillary and laboratory) are listed below for reference.     Microbiology: Recent Results (from the past 240 hour(s))  Urine culture     Status: Abnormal   Collection Time: 06/13/16  1:02 AM  Result Value Ref Range Status   Specimen Description URINE, CATHETERIZED  Final   Special Requests NONE  Final   Culture >=100,000 COLONIES/mL ENTEROCOCCUS FAECIUM (A)  Final   Report Status 06/15/2016 FINAL  Final   Organism ID, Bacteria ENTEROCOCCUS FAECIUM (A)  Final      Susceptibility   Enterococcus faecium - MIC*    AMPICILLIN >=32 RESISTANT Resistant     LEVOFLOXACIN >=8 RESISTANT Resistant     NITROFURANTOIN 256 RESISTANT Resistant     VANCOMYCIN <=0.5 SENSITIVE Sensitive     * >=100,000 COLONIES/mL ENTEROCOCCUS FAECIUM  Culture, blood (Routine X 2) w Reflex to ID Panel     Status: None (Preliminary result)   Collection Time: 06/13/16  3:05 AM  Result Value Ref Range Status   Specimen Description BLOOD RIGHT ARM  Final   Special Requests AEROBIC BOTTLE ONLY 5ML  Final   Culture NO GROWTH 4 DAYS  Final   Report Status PENDING  Incomplete  Culture, blood (Routine X 2) w Reflex to ID Panel     Status: None (Preliminary result)   Collection Time: 06/13/16  3:10 AM  Result Value Ref Range Status   Specimen Description BLOOD RIGHT HAND  Final   Special Requests AEROBIC BOTTLE ONLY 5ML  Final   Culture NO GROWTH 4 DAYS  Final   Report Status PENDING  Incomplete  Respiratory Panel by PCR     Status: None   Collection Time: 06/13/16  2:26 PM  Result Value Ref Range Status   Adenovirus NOT DETECTED NOT DETECTED Final   Coronavirus 229E NOT DETECTED NOT DETECTED Final   Coronavirus HKU1 NOT DETECTED NOT DETECTED Final   Coronavirus NL63 NOT DETECTED NOT DETECTED Final   Coronavirus OC43 NOT DETECTED NOT DETECTED Final   Metapneumovirus NOT DETECTED NOT DETECTED Final   Rhinovirus / Enterovirus  NOT DETECTED NOT DETECTED Final   Influenza A NOT DETECTED NOT DETECTED Final   Influenza B NOT DETECTED NOT DETECTED Final   Parainfluenza Virus 1 NOT DETECTED NOT DETECTED Final   Parainfluenza Virus 2 NOT DETECTED NOT DETECTED Final   Parainfluenza Virus 3 NOT DETECTED NOT DETECTED Final   Parainfluenza Virus 4 NOT DETECTED NOT DETECTED Final   Respiratory Syncytial Virus NOT DETECTED NOT DETECTED Final   Bordetella pertussis NOT DETECTED NOT DETECTED Final   Chlamydophila pneumoniae NOT DETECTED NOT DETECTED Final   Mycoplasma pneumoniae NOT DETECTED NOT DETECTED Final  MRSA PCR Screening     Status: Abnormal   Collection Time: 06/13/16  2:26 PM  Result Value Ref  Range Status   MRSA by PCR POSITIVE (A) NEGATIVE Final    Comment:        The GeneXpert MRSA Assay (FDA approved for NASAL specimens only), is one component of a comprehensive MRSA colonization surveillance program. It is not intended to diagnose MRSA infection nor to guide or monitor treatment for MRSA infections. RESULT CALLED TO, READ BACK BY AND VERIFIED WITH: [facility] RN 16:15 06/13/16 ([email or web address])      Labs: BNP (last 3 results) No results for input(s): BNP in the last 8760 hours. Basic Metabolic Panel:  Recent Labs Lab 06/14/16 0716 06/15/16 1425 06/16/16 0755 06/17/16 0705 06/18/16 0554  NA 147* 148* 149* 148* 147*  K 3.5 3.8 4.0 4.7 3.6  CL 121* 123* 123* 123* 120*  CO2 18* 19* 19* 17* 24  GLUCOSE 103* 127* 113* 102* 107*  BUN 11 9 8 11 13   CREATININE 1.03* 0.88 0.98 0.91 0.83  CALCIUM 11.0* 11.1* 11.0* 10.9* 10.4*  MG 1.9  --   --   --   --    Liver Function Tests:  Recent Labs Lab 06/12/16 2327  AST 35  ALT 26  ALKPHOS 124  BILITOT 1.1  PROT 7.3  ALBUMIN 3.5   No results for input(s): LIPASE, AMYLASE in the last 168 hours. No results for input(s): AMMONIA in the last 168 hours. CBC:  Recent Labs Lab 06/12/16 2242 06/13/16 0310 06/14/16 0716 06/15/16 1425 06/16/16 0755  WBC  13.1* 14.3* 13.4* 14.7* 12.9*  NEUTROABS 9.8*  --   --   --   --   HGB 12.1 12.1 10.4* 11.2* 11.3*  HCT 36.1 36.8 32.6* 34.3* 34.9*  MCV 88.9 88.9 90.3 90.0 90.4  PLT 323 401* 377 366 358   Cardiac Enzymes: No results for input(s): CKTOTAL, CKMB, CKMBINDEX, TROPONINI in the last 168 hours. BNP: Invalid input(s): POCBNP CBG:  Recent Labs Lab 06/14/16 2058 06/15/16 0850 06/16/16 0835 06/17/16 0811 06/18/16 0752  GLUCAP 117* 116* 116* 93 151*   D-Dimer No results for input(s): DDIMER in the last 72 hours. Hgb A1c No results for input(s): HGBA1C in the last 72 hours. Lipid Profile No results for input(s): CHOL, HDL, LDLCALC, TRIG, CHOLHDL, LDLDIRECT in the last 72 hours. Thyroid function studies No results for input(s): TSH, T4TOTAL, T3FREE, THYROIDAB in the last 72 hours.  Invalid input(s): FREET3 Anemia work up No results for input(s): VITAMINB12, FOLATE, FERRITIN, TIBC, IRON, RETICCTPCT in the last 72 hours. Urinalysis    Component Value Date/Time   COLORURINE YELLOW 06/13/2016 0102   APPEARANCEUR CLOUDY (A) 06/13/2016 0102   LABSPEC 1.014 06/13/2016 0102   PHURINE 6.5 06/13/2016 0102   GLUCOSEU NEGATIVE 06/13/2016 0102   HGBUR NEGATIVE 06/13/2016 0102   BILIRUBINUR MODERATE (A) 06/13/2016 0102   KETONESUR 15 (A) 06/13/2016 0102   PROTEINUR NEGATIVE 06/13/2016 0102   UROBILINOGEN 1.0 08/05/2011 1751   NITRITE NEGATIVE 06/13/2016 0102   LEUKOCYTESUR LARGE (A) 06/13/2016 0102   Sepsis Labs Invalid input(s): PROCALCITONIN,  WBC,  LACTICIDVEN Microbiology Recent Results (from the past 240 hour(s))  Urine culture     Status: Abnormal   Collection Time: 06/13/16  1:02 AM  Result Value Ref Range Status   Specimen Description URINE, CATHETERIZED  Final   Special Requests NONE  Final   Culture >=100,000 COLONIES/mL ENTEROCOCCUS FAECIUM (A)  Final   Report Status 06/15/2016 FINAL  Final   Organism ID, Bacteria ENTEROCOCCUS FAECIUM (A)  Final      Susceptibility    Enterococcus  faecium - MIC*    AMPICILLIN >=32 RESISTANT Resistant     LEVOFLOXACIN >=8 RESISTANT Resistant     NITROFURANTOIN 256 RESISTANT Resistant     VANCOMYCIN <=0.5 SENSITIVE Sensitive     * >=100,000 COLONIES/mL ENTEROCOCCUS FAECIUM  Culture, blood (Routine X 2) w Reflex to ID Panel     Status: None (Preliminary result)   Collection Time: 06/13/16  3:05 AM  Result Value Ref Range Status   Specimen Description BLOOD RIGHT ARM  Final   Special Requests AEROBIC BOTTLE ONLY 5ML  Final   Culture NO GROWTH 4 DAYS  Final   Report Status PENDING  Incomplete  Culture, blood (Routine X 2) w Reflex to ID Panel     Status: None (Preliminary result)   Collection Time: 06/13/16  3:10 AM  Result Value Ref Range Status   Specimen Description BLOOD RIGHT HAND  Final   Special Requests AEROBIC BOTTLE ONLY 5ML  Final   Culture NO GROWTH 4 DAYS  Final   Report Status PENDING  Incomplete  Respiratory Panel by PCR     Status: None   Collection Time: 06/13/16  2:26 PM  Result Value Ref Range Status   Adenovirus NOT DETECTED NOT DETECTED Final   Coronavirus 229E NOT DETECTED NOT DETECTED Final   Coronavirus HKU1 NOT DETECTED NOT DETECTED Final   Coronavirus NL63 NOT DETECTED NOT DETECTED Final   Coronavirus OC43 NOT DETECTED NOT DETECTED Final   Metapneumovirus NOT DETECTED NOT DETECTED Final   Rhinovirus / Enterovirus NOT DETECTED NOT DETECTED Final   Influenza A NOT DETECTED NOT DETECTED Final   Influenza B NOT DETECTED NOT DETECTED Final   Parainfluenza Virus 1 NOT DETECTED NOT DETECTED Final   Parainfluenza Virus 2 NOT DETECTED NOT DETECTED Final   Parainfluenza Virus 3 NOT DETECTED NOT DETECTED Final   Parainfluenza Virus 4 NOT DETECTED NOT DETECTED Final   Respiratory Syncytial Virus NOT DETECTED NOT DETECTED Final   Bordetella pertussis NOT DETECTED NOT DETECTED Final   Chlamydophila pneumoniae NOT DETECTED NOT DETECTED Final   Mycoplasma pneumoniae NOT DETECTED NOT DETECTED Final   MRSA PCR Screening     Status: Abnormal   Collection Time: 06/13/16  2:26 PM  Result Value Ref Range Status   MRSA by PCR POSITIVE (A) NEGATIVE Final    Comment:        The GeneXpert MRSA Assay (FDA approved for NASAL specimens only), is one component of a comprehensive MRSA colonization surveillance program. It is not intended to diagnose MRSA infection nor to guide or monitor treatment for MRSA infections. RESULT CALLED TO, READ BACK BY AND VERIFIED WITH: [facility] RN 16:15 06/13/16 ([email or web address])      Time coordinating discharge: Over 30 minutes  SIGNED:   Cordelia Poche, MD [facility] 06/18/2016, 1:29 PM Pager [phone]  If 7PM-7AM, please contact night-coverage [email or web address] Password [email or web address]

## 2016-06-18 NOTE — Progress Notes (Signed)
Pt can d/c to [facility] today as she has been assigned a [facility] number.  Family/pt aware and agreeable to plan.  MD aware and will work on d/c summary.  CSW will arrange transport via [place].  Creta Levin, LCSW [employer] [phone]

## 2016-06-18 NOTE — Progress Notes (Signed)
Pt has been accepted to [facility].  Pt/family are aware.  [place] [facility] will transfer.  Creta Levin, LCSW Weekend [employer] [phone]

## 2016-07-11 ENCOUNTER — Telehealth (HOSPITAL_BASED_OUTPATIENT_CLINIC_OR_DEPARTMENT_OTHER): Payer: Self-pay | Admitting: Emergency Medicine

## 2016-07-11 NOTE — Telephone Encounter (Signed)
LOST TO FOLLOWUP 

## 2016-10-16 IMAGING — CT CT HEAD W/O CM
2 of 4 series · 9 of 47 positions shown, 11 images · non-contrast
Comparison: CT of the head performed 05/03/2016, and MRI of the
brain performed 05/04/2016

CLINICAL DATA: Acute onset of altered mental status. Initial
encounter.

EXAM:
CT HEAD WITHOUT CONTRAST
TECHNIQUE: Contiguous axial images were obtained from the base of the skull
through the vertex without intravenous contrast.

[Series 205: coronals · coronal · 0.42mm/px · 6 of 62 slices shown, 8 images]
[im 7/62  brain]
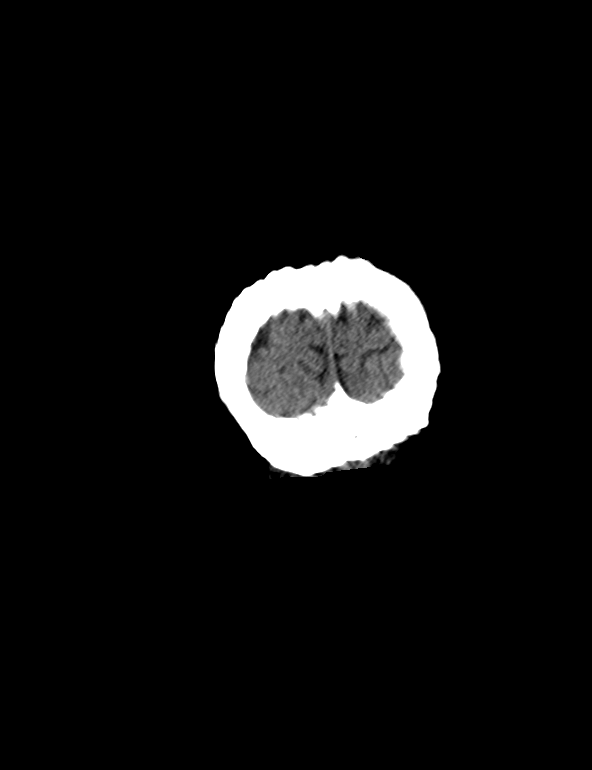
[im 7/62  bone]
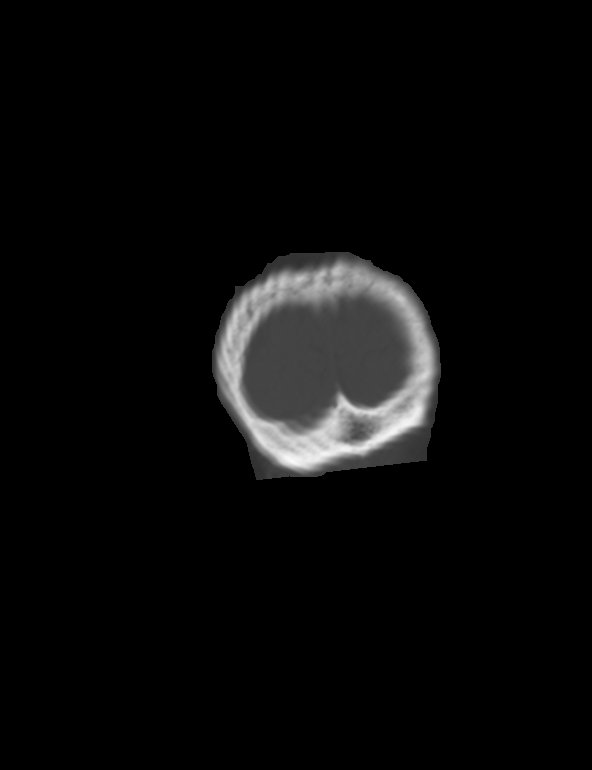
[im 21/62  brain]
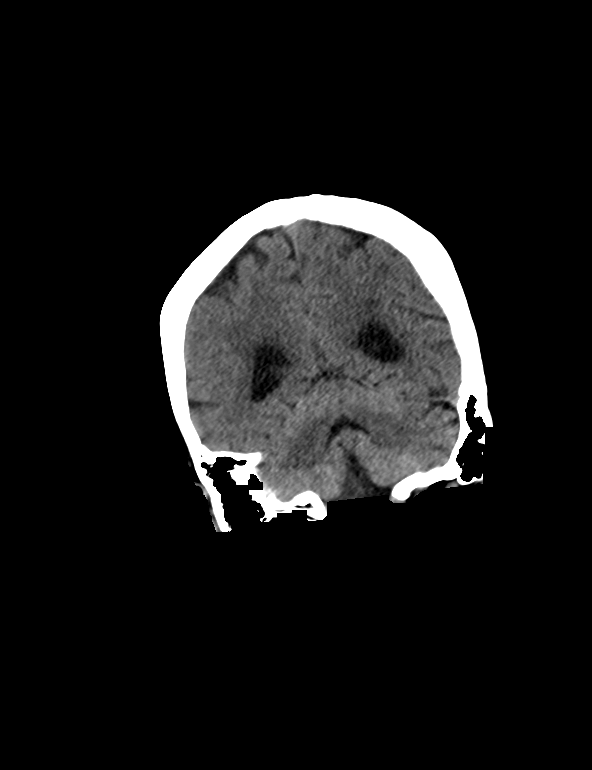
[im 28/62  brain]
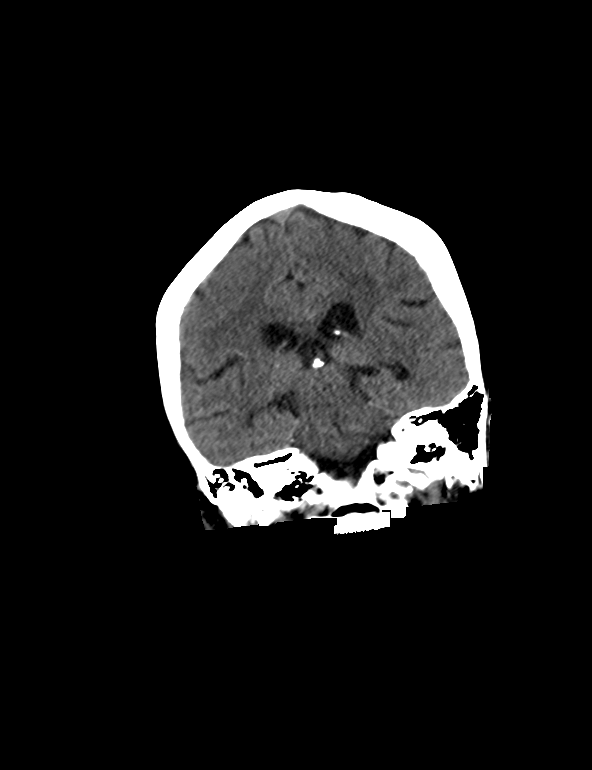
[im 34/62  brain]
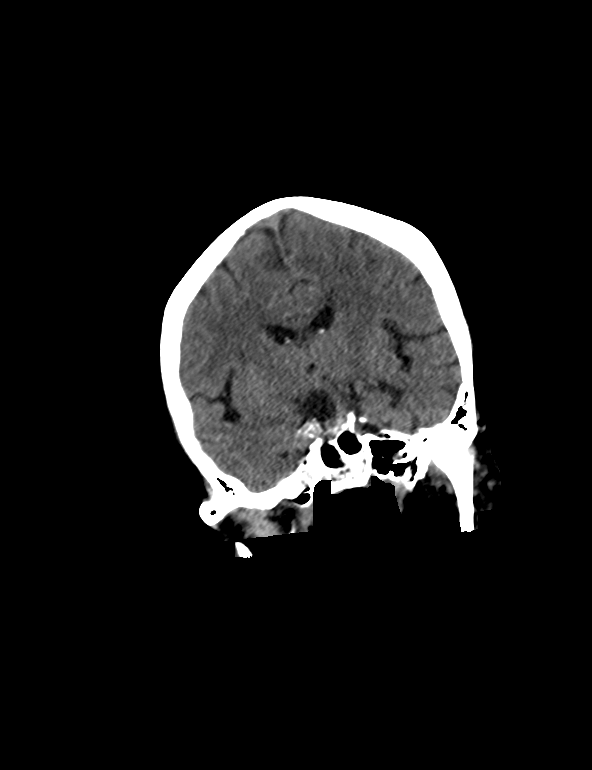
[im 48/62  brain]
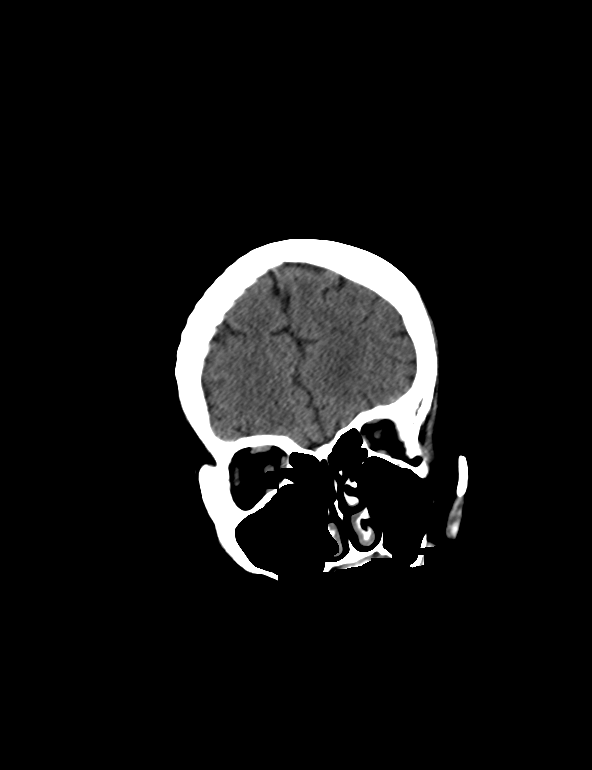
[im 48/62  bone]
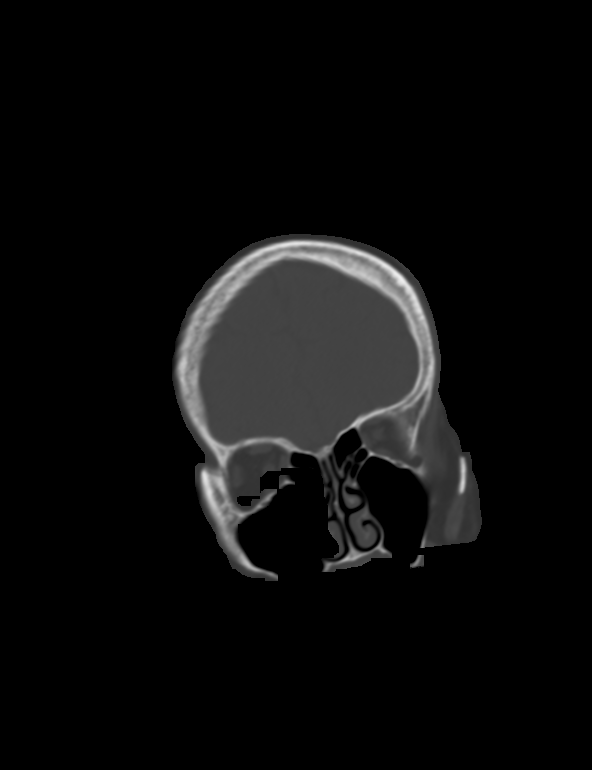
[im 55/62  brain]
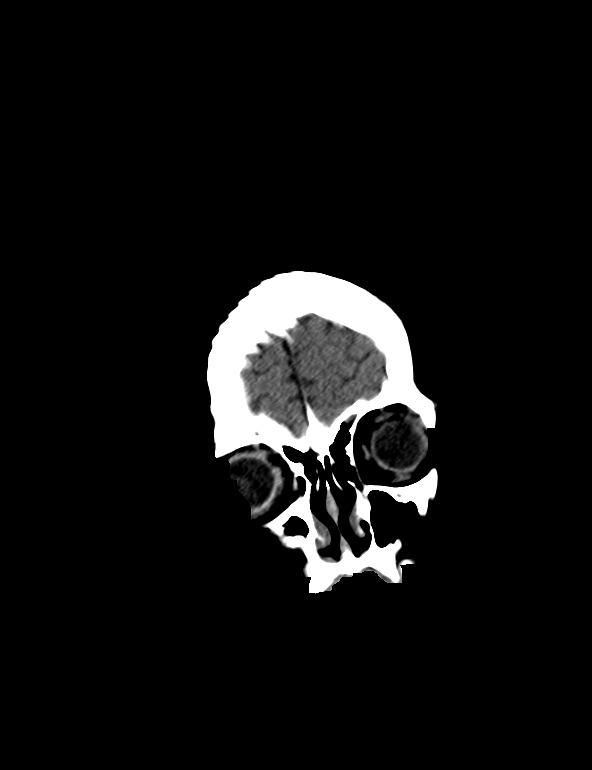

[Series 207: sagittals · sagittal · 0.40mm/px · 3 of 45 slices shown]
[im 15/45  brain]
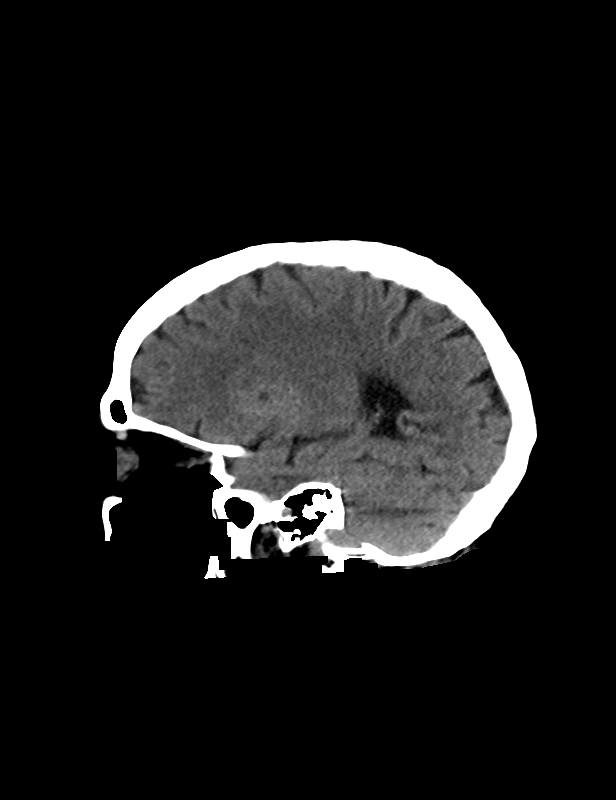
[im 23/45  brain]
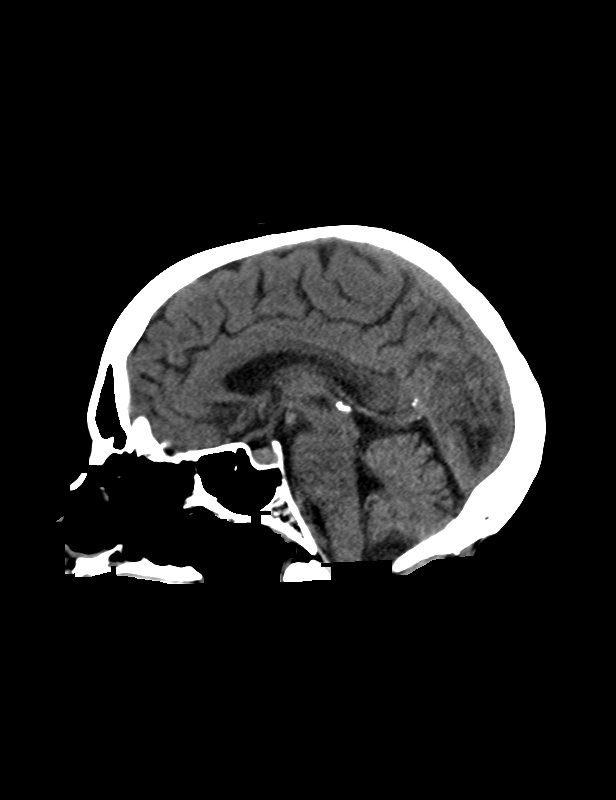
[im 30/45  brain]
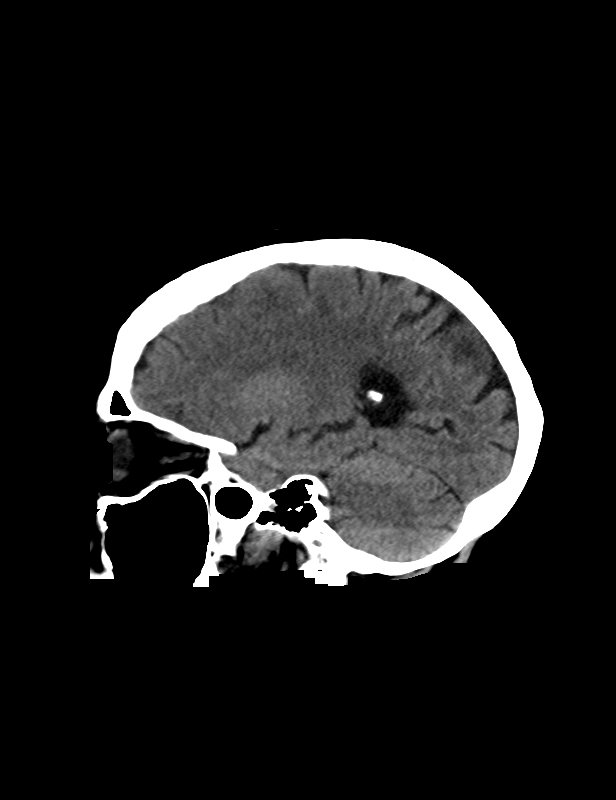

[9 of 47 positions shown; findings below may reference images not displayed]

FINDINGS: Brain: No evidence of acute infarction, hemorrhage, hydrocephalus,
extra-axial collection or mass lesion/mass effect.

Mild prominence of the ventricles and sulci suggests mild cortical
volume loss. Mild periventricular and subcortical white matter
change likely reflects small vessel ischemic microangiopathy. A
chronic lacunar infarct is noted at the right basal ganglia.

The brainstem and fourth ventricle are within normal limits. The
cerebral hemispheres demonstrate grossly normal gray-white
differentiation. No mass effect or midline shift is seen.

Vascular: No hyperdense vessel or unexpected calcification.

Skull: There is no evidence of fracture; visualized osseous
structures are unremarkable in appearance.

Sinuses/Orbits: The visualized portions of the orbits are within
normal limits. The paranasal sinuses and mastoid air cells are
well-aerated.

Other: No significant soft tissue abnormalities are seen.
IMPRESSION: 1. No acute intracranial pathology seen on CT.
2. Mild cortical volume loss and scattered small vessel ischemic
microangiopathy.
3. Chronic lacunar infarct at the right basal ganglia.

## 2016-10-16 IMAGING — DX DG CHEST 2V
2 series · 2 of 2 positions shown · non-contrast
Comparison: Prior radiograph from 06/06/2016.

CLINICAL DATA: Initial evaluation for acute altered mental status.
The

EXAM:
CHEST  2 VIEW

[chest lat]
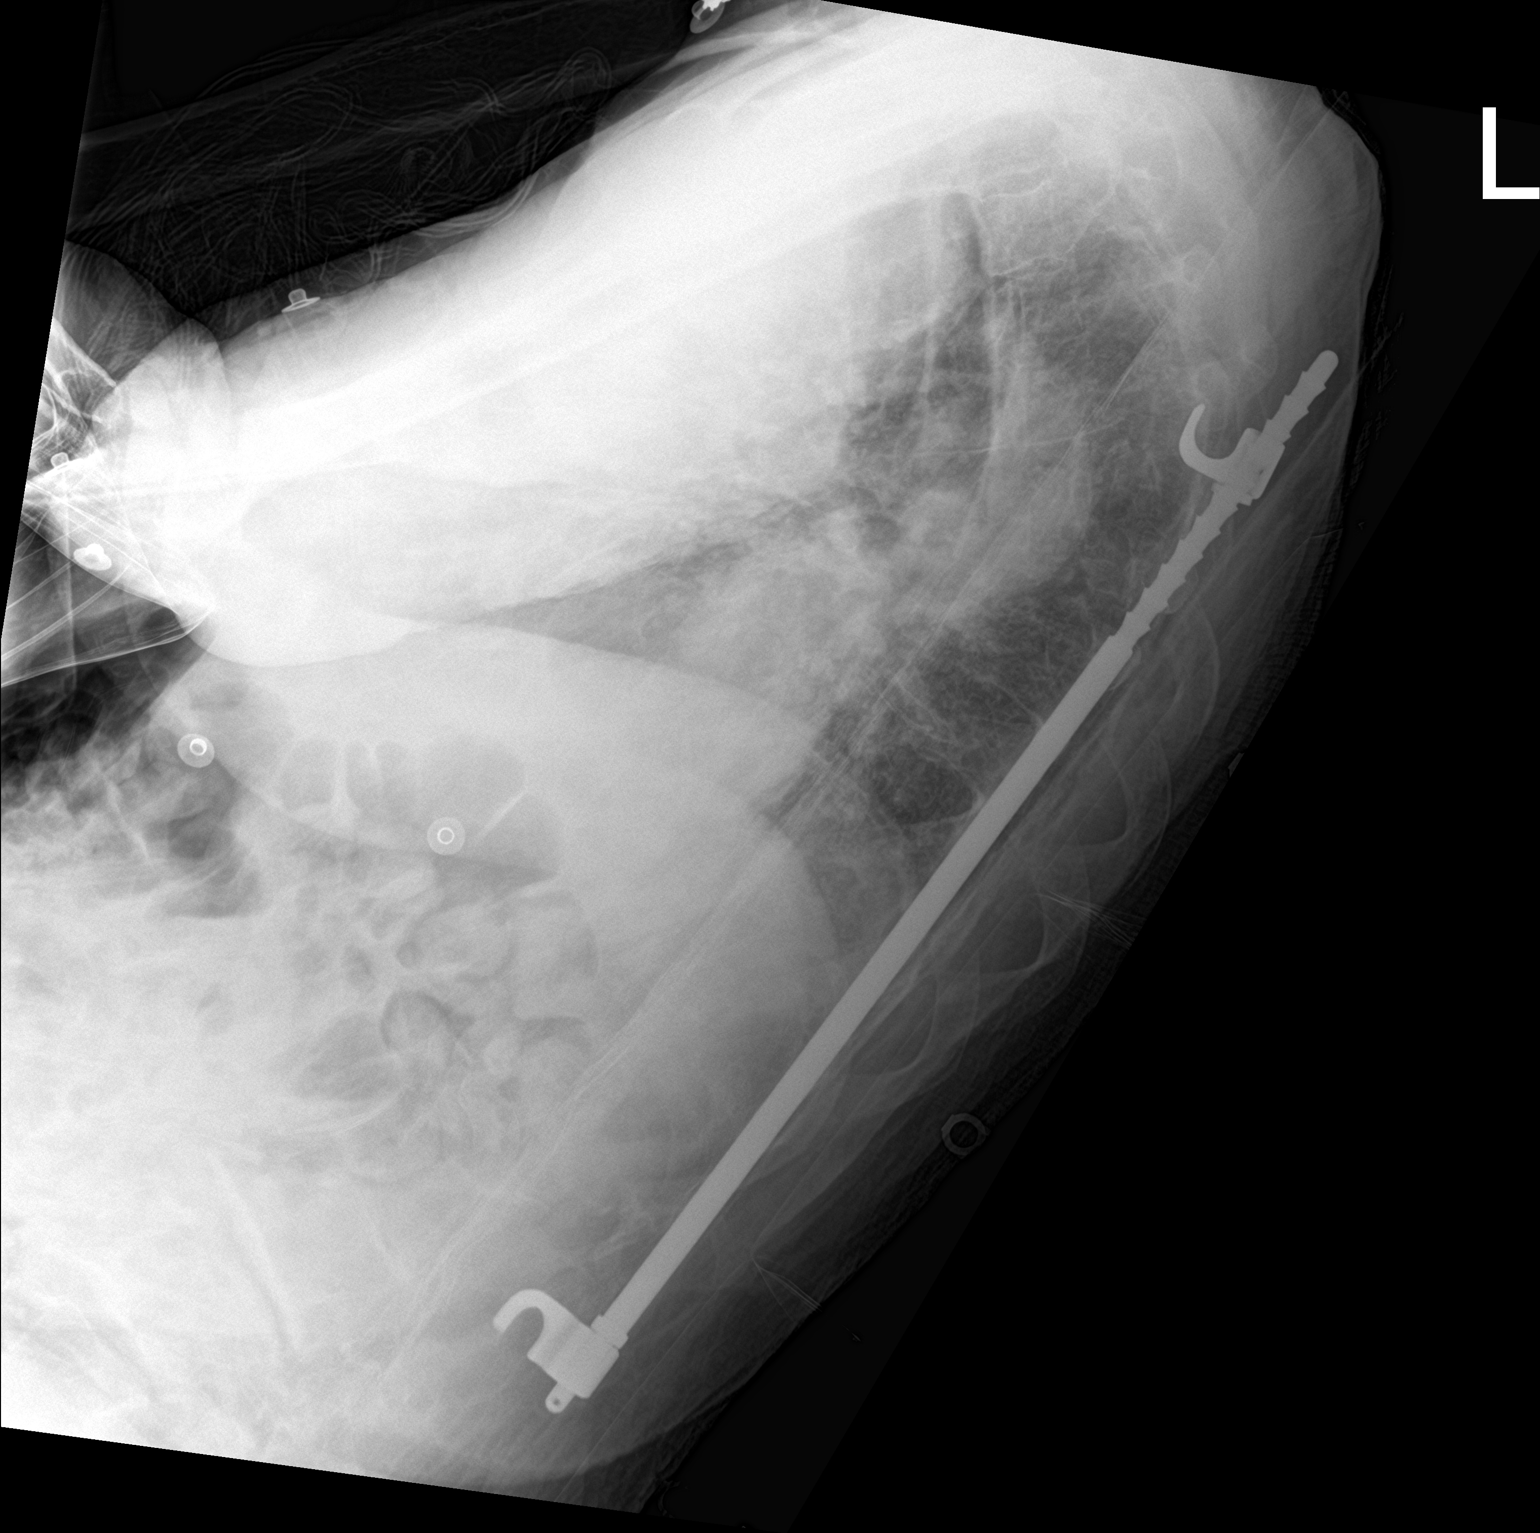

[chest ap]
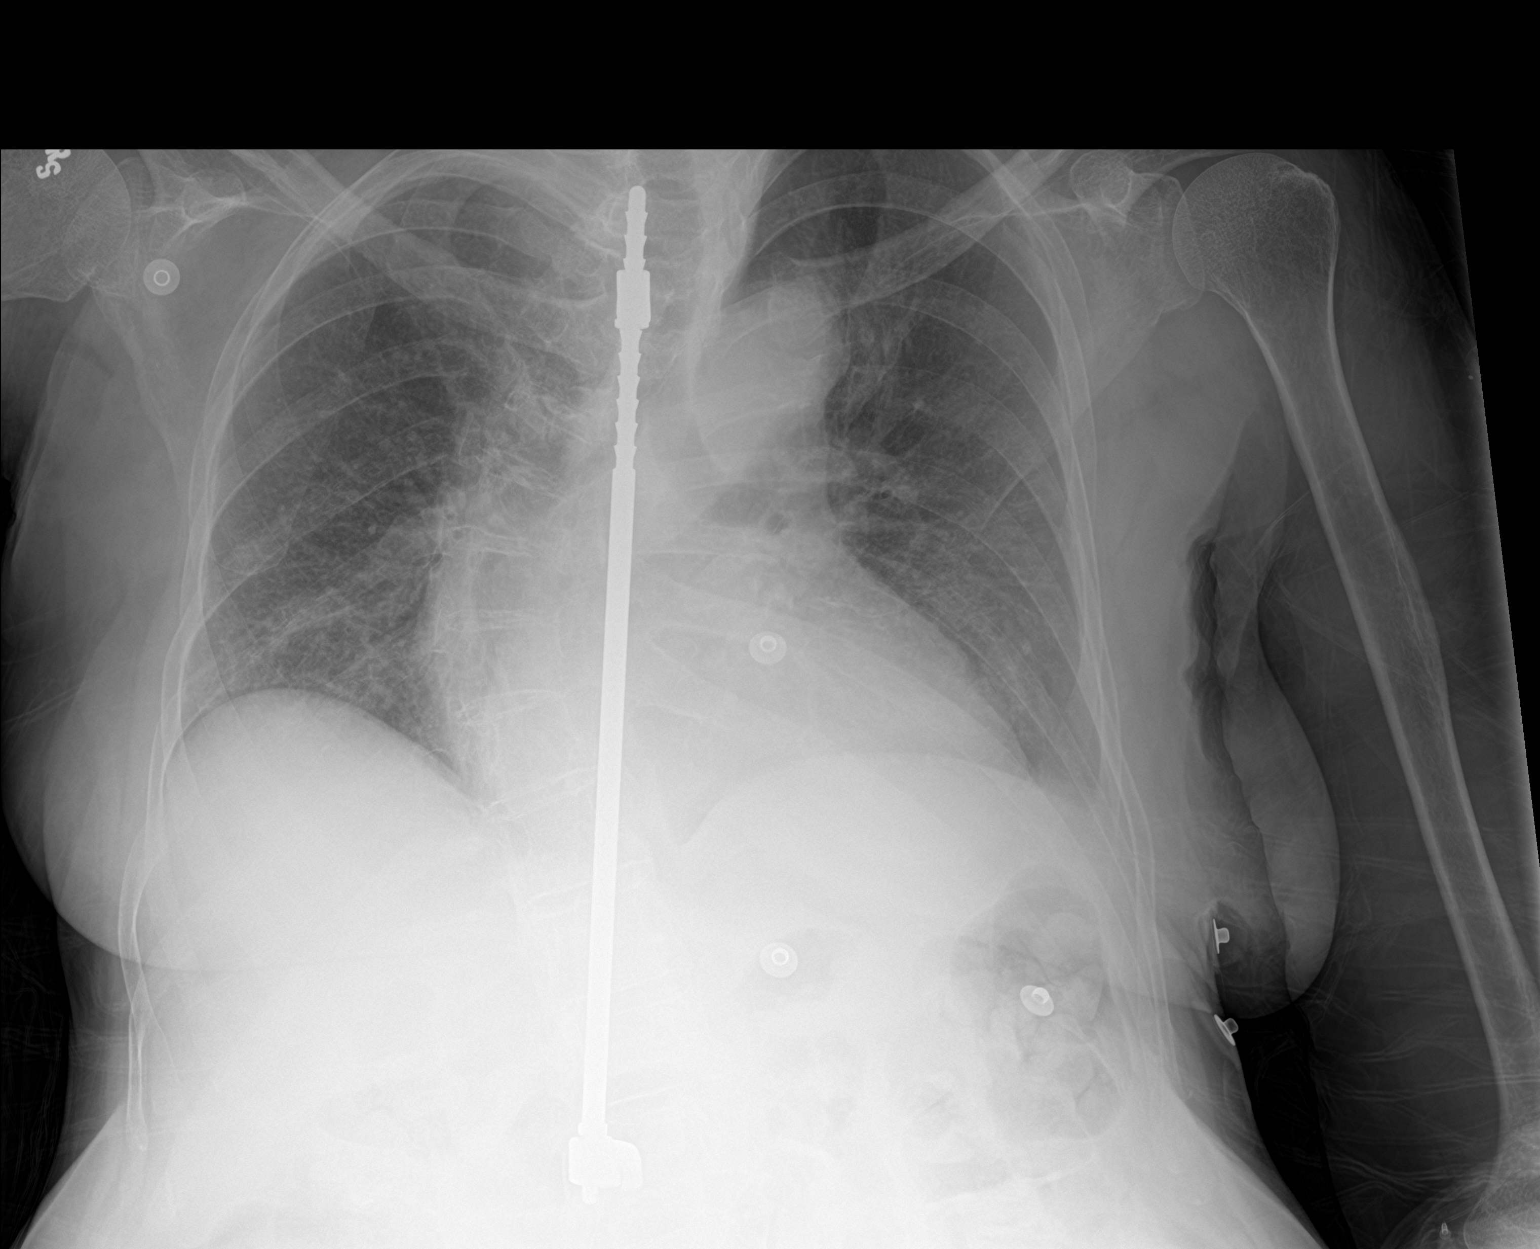

[2 of 2 positions shown; findings below may reference images not displayed]

FINDINGS: Study somewhat limited by overlying arm on lateral projection.

Cardiomegaly is stable.  Mediastinal silhouette unchanged.

Lungs hypoinflated. Mild scattered peribronchial thickening. No
focal infiltrates. No overt pulmonary edema. No pleural effusion. No
pneumothorax.

Scoliosis with Harrington rod fixation noted. No acute osseous
abnormality. Osteopenia noted.
IMPRESSION: 1. Mild scattered peribronchial thickening, nonspecific, but could
reflect sequela of acute bronchiolitis in the correct clinical
setting. Mild pulmonary interstitial congestion could also have this
appearance.
2. Stable cardiomegaly.

## 2017-09-19 ENCOUNTER — Other Ambulatory Visit: Payer: Self-pay | Admitting: Internal Medicine

## 2017-09-19 DIAGNOSIS — Z1231 Encounter for screening mammogram for malignant neoplasm of breast: Secondary | ICD-10-CM

## 2017-09-21 ENCOUNTER — Ambulatory Visit
Admission: RE | Admit: 2017-09-21 | Discharge: 2017-09-21 | Disposition: A | Discharge status: Home / Self Care | Payer: Medicare Other | Source: Ambulatory Visit | Attending: Internal Medicine | Admitting: Internal Medicine

## 2017-09-21 DIAGNOSIS — Z1231 Encounter for screening mammogram for malignant neoplasm of breast: Secondary | ICD-10-CM

## 2017-09-24 ENCOUNTER — Other Ambulatory Visit: Payer: Self-pay | Admitting: Internal Medicine

## 2017-09-24 DIAGNOSIS — R928 Other abnormal and inconclusive findings on diagnostic imaging of breast: Secondary | ICD-10-CM

## 2017-09-26 ENCOUNTER — Ambulatory Visit
Admission: RE | Admit: 2017-09-26 | Discharge: 2017-09-26 | Disposition: A | Discharge status: Home / Self Care | Payer: Medicare Other | Source: Ambulatory Visit | Attending: Internal Medicine | Admitting: Internal Medicine

## 2017-09-26 ENCOUNTER — Other Ambulatory Visit: Payer: Self-pay | Admitting: Internal Medicine

## 2017-09-26 DIAGNOSIS — R928 Other abnormal and inconclusive findings on diagnostic imaging of breast: Secondary | ICD-10-CM

## 2017-09-26 DIAGNOSIS — R921 Mammographic calcification found on diagnostic imaging of breast: Secondary | ICD-10-CM

## 2018-03-25 ENCOUNTER — Ambulatory Visit
Admission: RE | Admit: 2018-03-25 | Discharge: 2018-03-25 | Disposition: A | Discharge status: Home / Self Care | Payer: Medicare Other | Source: Ambulatory Visit | Attending: Internal Medicine | Admitting: Internal Medicine

## 2018-03-25 DIAGNOSIS — R921 Mammographic calcification found on diagnostic imaging of breast: Secondary | ICD-10-CM

## 2018-09-26 ENCOUNTER — Encounter (HOSPITAL_COMMUNITY): Payer: Self-pay

## 2018-09-26 ENCOUNTER — Emergency Department (HOSPITAL_COMMUNITY): Payer: Medicare Other

## 2018-09-26 ENCOUNTER — Other Ambulatory Visit: Payer: Self-pay

## 2018-09-26 ENCOUNTER — Inpatient Hospital Stay (HOSPITAL_COMMUNITY)
Admission: EM | Admit: 2018-09-26 | Discharge: 2018-10-03 | DRG: 470 | Disposition: A | Discharge status: Skilled Nursing Facility | Payer: Medicare Other | Attending: Internal Medicine | Admitting: Internal Medicine

## 2018-09-26 ENCOUNTER — Inpatient Hospital Stay (HOSPITAL_COMMUNITY): Payer: Medicare Other

## 2018-09-26 DIAGNOSIS — Z96649 Presence of unspecified artificial hip joint: Secondary | ICD-10-CM

## 2018-09-26 DIAGNOSIS — Z419 Encounter for procedure for purposes other than remedying health state, unspecified: Secondary | ICD-10-CM

## 2018-09-26 DIAGNOSIS — D72829 Elevated white blood cell count, unspecified: Secondary | ICD-10-CM | POA: Diagnosis present

## 2018-09-26 DIAGNOSIS — W19XXXA Unspecified fall, initial encounter: Secondary | ICD-10-CM | POA: Diagnosis not present

## 2018-09-26 DIAGNOSIS — M1612 Unilateral primary osteoarthritis, left hip: Secondary | ICD-10-CM | POA: Diagnosis present

## 2018-09-26 DIAGNOSIS — F329 Major depressive disorder, single episode, unspecified: Secondary | ICD-10-CM | POA: Diagnosis present

## 2018-09-26 DIAGNOSIS — I4510 Unspecified right bundle-branch block: Secondary | ICD-10-CM | POA: Diagnosis present

## 2018-09-26 DIAGNOSIS — F259 Schizoaffective disorder, unspecified: Secondary | ICD-10-CM | POA: Diagnosis present

## 2018-09-26 DIAGNOSIS — I452 Bifascicular block: Secondary | ICD-10-CM | POA: Diagnosis present

## 2018-09-26 DIAGNOSIS — R05 Cough: Secondary | ICD-10-CM

## 2018-09-26 DIAGNOSIS — D62 Acute posthemorrhagic anemia: Secondary | ICD-10-CM | POA: Diagnosis not present

## 2018-09-26 DIAGNOSIS — F419 Anxiety disorder, unspecified: Secondary | ICD-10-CM | POA: Diagnosis present

## 2018-09-26 DIAGNOSIS — E559 Vitamin D deficiency, unspecified: Secondary | ICD-10-CM | POA: Diagnosis present

## 2018-09-26 DIAGNOSIS — Z7982 Long term (current) use of aspirin: Secondary | ICD-10-CM

## 2018-09-26 DIAGNOSIS — Y92092 Bedroom in other non-institutional residence as the place of occurrence of the external cause: Secondary | ICD-10-CM | POA: Diagnosis not present

## 2018-09-26 DIAGNOSIS — W06XXXA Fall from bed, initial encounter: Secondary | ICD-10-CM | POA: Diagnosis not present

## 2018-09-26 DIAGNOSIS — Z882 Allergy status to sulfonamides status: Secondary | ICD-10-CM

## 2018-09-26 DIAGNOSIS — S72002A Fracture of unspecified part of neck of left femur, initial encounter for closed fracture: Principal | ICD-10-CM

## 2018-09-26 DIAGNOSIS — Z8249 Family history of ischemic heart disease and other diseases of the circulatory system: Secondary | ICD-10-CM | POA: Diagnosis not present

## 2018-09-26 DIAGNOSIS — M79605 Pain in left leg: Secondary | ICD-10-CM | POA: Diagnosis present

## 2018-09-26 DIAGNOSIS — Z79899 Other long term (current) drug therapy: Secondary | ICD-10-CM

## 2018-09-26 DIAGNOSIS — F258 Other schizoaffective disorders: Secondary | ICD-10-CM | POA: Diagnosis not present

## 2018-09-26 DIAGNOSIS — F17210 Nicotine dependence, cigarettes, uncomplicated: Secondary | ICD-10-CM | POA: Diagnosis present

## 2018-09-26 DIAGNOSIS — R059 Cough, unspecified: Secondary | ICD-10-CM

## 2018-09-26 DIAGNOSIS — I10 Essential (primary) hypertension: Secondary | ICD-10-CM | POA: Diagnosis present

## 2018-09-26 LAB — CBC WITH DIFFERENTIAL/PLATELET
Abs Immature Granulocytes: 0.12 10*3/uL — ABNORMAL HIGH (ref 0.00–0.07)
BASOS ABS: 0 10*3/uL (ref 0.0–0.1)
BASOS PCT: 0 %
EOS ABS: 0 10*3/uL (ref 0.0–0.5)
EOS PCT: 0 %
HCT: 41.9 % (ref 36.0–46.0)
Hemoglobin: 13 g/dL (ref 12.0–15.0)
IMMATURE GRANULOCYTES: 1 %
Lymphocytes Relative: 6 %
Lymphs Abs: 1.2 10*3/uL (ref 0.7–4.0)
MCH: 30.7 pg (ref 26.0–34.0)
MCHC: 31 g/dL (ref 30.0–36.0)
MCV: 99.1 fL (ref 80.0–100.0)
Monocytes Absolute: 0.9 10*3/uL (ref 0.1–1.0)
Monocytes Relative: 5 %
NEUTROS PCT: 88 %
NRBC: 0 % (ref 0.0–0.2)
Neutro Abs: 16.1 10*3/uL — ABNORMAL HIGH (ref 1.7–7.7)
PLATELETS: 358 10*3/uL (ref 150–400)
RBC: 4.23 MIL/uL (ref 3.87–5.11)
RDW: 12.6 % (ref 11.5–15.5)
WBC: 18.3 10*3/uL — ABNORMAL HIGH (ref 4.0–10.5)

## 2018-09-26 LAB — URINALYSIS, ROUTINE W REFLEX MICROSCOPIC
BILIRUBIN URINE: NEGATIVE
Glucose, UA: NEGATIVE mg/dL
Ketones, ur: NEGATIVE mg/dL
Nitrite: NEGATIVE
Protein, ur: NEGATIVE mg/dL
Specific Gravity, Urine: 1.009 (ref 1.005–1.030)
pH: 7 (ref 5.0–8.0)

## 2018-09-26 LAB — BASIC METABOLIC PANEL
ANION GAP: 8 (ref 5–15)
BUN: 15 mg/dL (ref 8–23)
CALCIUM: 11.6 mg/dL — AB (ref 8.9–10.3)
CO2: 24 mmol/L (ref 22–32)
Chloride: 106 mmol/L (ref 98–111)
Creatinine, Ser: 0.91 mg/dL (ref 0.44–1.00)
Glucose, Bld: 125 mg/dL — ABNORMAL HIGH (ref 70–99)
Potassium: 4 mmol/L (ref 3.5–5.1)
SODIUM: 138 mmol/L (ref 135–145)

## 2018-09-26 LAB — PROTIME-INR
INR: 1.17
Prothrombin Time: 14.8 seconds (ref 11.4–15.2)

## 2018-09-26 LAB — TYPE AND SCREEN
ABO/RH(D): O POS
Antibody Screen: POSITIVE
PT AG Type: NEGATIVE

## 2018-09-26 LAB — SURGICAL PCR SCREEN
MRSA, PCR: NEGATIVE
Staphylococcus aureus: NEGATIVE

## 2018-09-26 MED ORDER — LORAZEPAM 0.5 MG PO TABS
0.5000 mg | ORAL_TABLET | Freq: Two times a day (BID) | ORAL | Status: DC | PRN
  Administered 2018-09-28: 0.5 mg via ORAL
  Filled 2018-09-26: qty 1

## 2018-09-26 MED ORDER — NYSTATIN 100000 UNIT/ML MT SUSP
5.0000 mL | Freq: Four times a day (QID) | OROMUCOSAL | Status: DC
  Administered 2018-09-26 – 2018-10-03 (×24): 500000 [IU] via ORAL
  Filled 2018-09-26 (×30): qty 5

## 2018-09-26 MED ORDER — MORPHINE SULFATE (PF) 4 MG/ML IV SOLN
1.0000 mg | INTRAVENOUS | Status: DC | PRN
  Administered 2018-09-27 (×3): 1 mg via INTRAVENOUS
  Filled 2018-09-26 (×4): qty 1

## 2018-09-26 MED ORDER — FLUPHENAZINE HCL 2.5 MG PO TABS
20.0000 mg | ORAL_TABLET | Freq: Every day | ORAL | Status: DC
  Administered 2018-09-26 – 2018-10-03 (×6): 20 mg via ORAL
  Filled 2018-09-26: qty 8
  Filled 2018-09-26 (×2): qty 2
  Filled 2018-09-26 (×3): qty 8
  Filled 2018-09-26 (×3): qty 2
  Filled 2018-09-26: qty 4
  Filled 2018-09-26: qty 8

## 2018-09-26 MED ORDER — LITHIUM CARBONATE 300 MG PO CAPS
300.0000 mg | ORAL_CAPSULE | Freq: Once | ORAL | Status: AC
  Administered 2018-09-26: 300 mg via ORAL
  Filled 2018-09-26: qty 1

## 2018-09-26 MED ORDER — SODIUM CHLORIDE 0.9 % IV SOLN
INTRAVENOUS | Status: DC
  Administered 2018-09-26: 22:00:00 via INTRAVENOUS

## 2018-09-26 MED ORDER — PROPYLENE GLYCOL 0.6 % OP SOLN: 1.0000 [drp] | Freq: Three times a day (TID) | OPHTHALMIC | Status: DC

## 2018-09-26 MED ORDER — MELATONIN 3 MG PO TABS
6.0000 mg | ORAL_TABLET | Freq: Every day | ORAL | Status: DC
  Administered 2018-09-26 – 2018-10-02 (×7): 6 mg via ORAL
  Filled 2018-09-26 (×7): qty 2

## 2018-09-26 MED ORDER — ACETAMINOPHEN 10 MG/ML IV SOLN
1000.0000 mg | Freq: Four times a day (QID) | INTRAVENOUS | Status: AC | PRN
  Administered 2018-09-26 (×2): 1000 mg via INTRAVENOUS
  Filled 2018-09-26 (×3): qty 100

## 2018-09-26 MED ORDER — BENZTROPINE MESYLATE 1 MG PO TABS
1.0000 mg | ORAL_TABLET | Freq: Two times a day (BID) | ORAL | Status: DC
  Administered 2018-09-26 – 2018-10-03 (×13): 1 mg via ORAL
  Filled 2018-09-26 (×14): qty 1

## 2018-09-26 MED ORDER — ONDANSETRON HCL 4 MG/2ML IJ SOLN: 4.0000 mg | Freq: Four times a day (QID) | INTRAMUSCULAR | Status: DC | PRN

## 2018-09-26 MED ORDER — LITHIUM CARBONATE 300 MG PO CAPS
300.0000 mg | ORAL_CAPSULE | ORAL | Status: DC
  Administered 2018-09-28 – 2018-10-03 (×6): 300 mg via ORAL
  Filled 2018-09-26 (×7): qty 1

## 2018-09-26 MED ORDER — TRANEXAMIC ACID 1000 MG/10ML IV SOLN
2000.0000 mg | INTRAVENOUS | Status: DC
  Filled 2018-09-26 (×2): qty 20

## 2018-09-26 MED ORDER — TRANEXAMIC ACID-NACL 1000-0.7 MG/100ML-% IV SOLN
1000.0000 mg | INTRAVENOUS | Status: DC
  Filled 2018-09-26 (×2): qty 100

## 2018-09-26 MED ORDER — LORAZEPAM 0.5 MG PO TABS
0.5000 mg | ORAL_TABLET | Freq: Four times a day (QID) | ORAL | Status: DC
  Administered 2018-09-26 – 2018-10-03 (×26): 0.5 mg via ORAL
  Filled 2018-09-26 (×26): qty 1

## 2018-09-26 MED ORDER — ACETAMINOPHEN 500 MG PO TABS
500.0000 mg | ORAL_TABLET | Freq: Once | ORAL | Status: AC
  Administered 2018-09-26: 500 mg via ORAL
  Filled 2018-09-26: qty 1

## 2018-09-26 MED ORDER — ONDANSETRON HCL 4 MG PO TABS: 4.0000 mg | ORAL_TABLET | Freq: Four times a day (QID) | ORAL | Status: DC | PRN

## 2018-09-26 MED ORDER — LITHIUM CARBONATE 150 MG PO CAPS
150.0000 mg | ORAL_CAPSULE | ORAL | Status: DC
  Administered 2018-09-28 – 2018-10-02 (×5): 150 mg via ORAL
  Filled 2018-09-26 (×8): qty 1

## 2018-09-26 MED ORDER — DOCUSATE SODIUM 100 MG PO CAPS
100.0000 mg | ORAL_CAPSULE | Freq: Two times a day (BID) | ORAL | Status: DC
  Administered 2018-09-26 – 2018-10-03 (×13): 100 mg via ORAL
  Filled 2018-09-26 (×13): qty 1

## 2018-09-26 MED ORDER — LURASIDONE HCL 40 MG PO TABS
120.0000 mg | ORAL_TABLET | Freq: Every day | ORAL | Status: DC
  Administered 2018-09-26 – 2018-10-03 (×7): 120 mg via ORAL
  Filled 2018-09-26 (×8): qty 3

## 2018-09-26 MED ORDER — IPRATROPIUM-ALBUTEROL 0.5-2.5 (3) MG/3ML IN SOLN
3.0000 mL | Freq: Four times a day (QID) | RESPIRATORY_TRACT | Status: DC
  Administered 2018-09-26 – 2018-09-27 (×2): 3 mL via RESPIRATORY_TRACT
  Filled 2018-09-26 (×2): qty 3

## 2018-09-26 MED ORDER — BISACODYL 5 MG PO TBEC: 5.0000 mg | DELAYED_RELEASE_TABLET | Freq: Every day | ORAL | Status: DC | PRN

## 2018-09-26 MED ORDER — POLYVINYL ALCOHOL 1.4 % OP SOLN
1.0000 [drp] | Freq: Three times a day (TID) | OPHTHALMIC | Status: DC
  Administered 2018-09-26 – 2018-10-03 (×18): 1 [drp] via OPHTHALMIC
  Filled 2018-09-26: qty 15

## 2018-09-26 MED ORDER — SERTRALINE HCL 100 MG PO TABS
100.0000 mg | ORAL_TABLET | Freq: Every day | ORAL | Status: DC
  Administered 2018-09-26 – 2018-10-03 (×7): 100 mg via ORAL
  Filled 2018-09-26: qty 2
  Filled 2018-09-26 (×6): qty 1

## 2018-09-26 MED ORDER — FENTANYL CITRATE (PF) 100 MCG/2ML IJ SOLN
25.0000 ug | INTRAMUSCULAR | Status: AC | PRN
  Administered 2018-09-26 (×2): 25 ug via INTRAVENOUS
  Filled 2018-09-26 (×2): qty 2

## 2018-09-26 NOTE — Consult Note (Addendum)
ORTHOPAEDIC CONSULTATION  REQUESTING PHYSICIAN: Elmarie Shiley, MD  Chief Complaint: Left femoral neck fracture  HPI: Heidi Blevins is a 68 y.o. female who presents with left hip fracture s/p mechanical fall out of bed about 1 week ago.  She developed worsening pain and inability to ambulate over the past week.  The patient endorses severe pain in the left hip, that does not radiate, grinding in quality, worse with any movement, better with immobilization.  Denies LOC/fever/chills/nausea/vomiting.  Walks without assistive devices (walker, cane, wheelchair).  Does live independently.  Denies LOC, neck pain, abd pain.  Past Medical History:  Diagnosis Date  . Anxiety   . Arrhythmia   . Bifascicular block   . Depression   . Hypertension   . Insomnia   . Schizoaffective disorder   . Scoliosis   . Vitamin D deficiency    Past Surgical History:  Procedure Laterality Date  . BACK SURGERY     "as a child after she fell out of a tree"  . TONSILLECTOMY     Social History   Socioeconomic History  . Marital status: Divorced    Spouse name: Not on file  . Number of children: Not on file  . Years of education: Not on file  . Highest education level: Not on file  Occupational History  . Occupation: Unemployed  Social Needs  . Financial resource strain: Not on file  . Food insecurity:    Worry: Not on file    Inability: Not on file  . Transportation needs:    Medical: Not on file    Non-medical: Not on file  Tobacco Use  . Smoking status: Current Every Day Smoker    Packs/day: 0.25    Years: 40.00    Pack years: 10.00    Types: Cigarettes  . Smokeless tobacco: Never Used  Substance and Sexual Activity  . Alcohol use: No  . Drug use: No  . Sexual activity: Never  Lifestyle  . Physical activity:    Days per week: Not on file    Minutes per session: Not on file  . Stress: Not on file  Relationships  . Social connections:    Talks on phone: Not on file   Gets together: Not on file    Attends religious service: Not on file    Active member of club or organization: Not on file    Attends meetings of clubs or organizations: Not on file    Relationship status: Not on file  Other Topics Concern  . Not on file  Social History Narrative   Lives in an ALF.  Normally independent of ADLs and with ambulation.   Family History  Problem Relation Age of Onset  . CVA Mother   . Hypertension Mother   . Lupus Sister   . Hypertension Brother   . Peptic Ulcer Disease Father   . Colon cancer Neg Hx    Allergies  Allergen Reactions  . Sulfonamide Derivatives Hives        Prior to Admission medications   Medication Sig Start Date End Date Taking? Authorizing Provider  acetaminophen (TYLENOL) 500 MG tablet Take 500 mg by mouth every morning.    Yes [provider]  acetaminophen (TYLENOL) 500 MG tablet Take 500 mg by mouth 3 (three) times daily as needed for mild pain or headache. Atleast 4 hours apart from scheduled dose   Yes [provider]  aspirin EC 81 MG EC tablet Take 1 tablet (  81 mg total) by mouth daily. 05/06/16  Yes Patrecia Pour, Christean Grief, MD  benzocaine-resorcinol (VAGISIL) 5-2 % vaginal cream Place 1 application vaginally as needed for itching.   Yes [provider]  benztropine (COGENTIN) 1 MG tablet Take 1 mg by mouth 2 (two) times daily.   Yes [provider]  Cranberry 500 MG TABS Take 500 mg by mouth 2 (two) times daily.    Yes [provider]  ENSURE (ENSURE) Take 237 mLs by mouth 2 (two) times daily. Strawberry   Yes [provider]  ferrous [id number] C-folic acid (FEROCON) capsule Take 1 capsule by mouth at bedtime.   Yes [provider]  fluPHENAZine (PROLIXIN) 10 MG tablet Take 20 mg by mouth daily.    Yes [provider]  LATUDA 120 MG TABS Take 120 mg by mouth daily. 09/11/18  Yes [provider]  lisinopril (PRINIVIL,ZESTRIL) 5 MG tablet Take  5 mg by mouth daily. 09/11/18  Yes [provider]  lithium carbonate 150 MG capsule Take 150-300 mg by mouth 2 (two) times daily. 300mg  at 8am 150mg  at 8pm 09/11/18  Yes [provider]  loratadine (CLARITIN) 10 MG tablet Take 10 mg by mouth daily.   Yes [provider]  LORazepam (ATIVAN) 0.5 MG tablet Take 0.5 mg by mouth 4 (four) times daily. 09/23/18  Yes [provider]  LORazepam (ATIVAN) 1 MG tablet Take 0.5 mg by mouth 2 (two) times daily as needed for anxiety.   Yes [provider]  Melatonin 5 MG TABS Take 5 mg by mouth at bedtime.   Yes [provider]  metoprolol tartrate (LOPRESSOR) 25 MG tablet Take 1 tablet (25 mg total) by mouth 2 (two) times daily. 05/06/16  Yes Patrecia Pour, Christean Grief, MD  Nutritional Supplements (NUTRITIONAL SHAKE PLUS PO) Take 1 Container by mouth 3 (three) times daily.   Yes [provider]  Propylene Glycol (SYSTANE BALANCE) 0.6 % SOLN Place 1 drop into both eyes 3 (three) times daily.    Yes [provider]  RISPERDAL CONSTA 37.5 MG injection Inject 37.5 mg into the skin as directed. Every two weeks 09/16/18  Yes [provider]  sertraline (ZOLOFT) 100 MG tablet Take 100 mg by mouth daily.    Yes [provider]  traMADol (ULTRAM) 50 MG tablet Take 25 mg by mouth every 8 (eight) hours as needed for pain. 09/16/18  Yes [provider]   Dg Lumbar Spine Complete  Result Date: 09/26/2018 CLINICAL DATA:  Fall.  Back pain. EXAM: LUMBAR SPINE - COMPLETE 4+ VIEW COMPARISON:  05/16/2016 FINDINGS: Five lumbar type vertebral bodies. Lumbar levoscoliosis with Harrington rod in place and partially covered, spanning L2 superiorly. There is ankylosis/fusion of the spanned levels. Subjacent severe degenerative disease which limits radiographic evaluation. No acute finding seen in the lumbar spine. Known left proximal femur fracture and left hip osteoarthritis. IMPRESSION: 1. No acute  finding in the lumbar spine. 2. Scoliosis with Harrington rod fusion. There is advanced lumbar degenerative disease below the fusion. 3. Known left proximal femur fracture. Electronically Signed   By: Monte Fantasia M.D.   On: 09/26/2018 08:31   Ct Hip Left Wo Contrast  Result Date: 09/26/2018 CLINICAL DATA:  Left hip pain since a fall out of bed 09/19/2018. Initial encounter. EXAM: CT OF THE LEFT HIP WITHOUT CONTRAST TECHNIQUE: Multidetector CT imaging of the left hip was performed according to the standard protocol. Multiplanar CT image reconstructions were also generated. COMPARISON:  Plain films left hip earlier today. FINDINGS: Bones/Joint/Cartilage As seen on the comparison exam, the patient has a fracture through the base of the left femoral neck with impaction of approximately 1.5 cm. No extension along the intertrochanteric ridge is identified. No other fracture is seen. Moderately severe left hip degenerative change is present with subchondral cyst formation and osteophytosis identified. Loss of disc space height and vacuum disc phenomenon are seen at L3-4, L4-5 and L5-S1. No lytic or sclerotic lesion is identified. Ligaments Suboptimally assessed by CT. Muscles and Tendons Intact. Soft tissues Imaged intrapelvic contents demonstrate atherosclerotic vascular disease. IMPRESSION: Mildly impacted fracture through the base of the left femoral neck. No other acute abnormality. Moderately severe left hip osteoarthritis. Lower lumbar spondylosis. Atherosclerosis. Electronically Signed   By: Inge Rise M.D.   On: 09/26/2018 10:07   Dg Chest Port 1 View  Result Date: 09/26/2018 CLINICAL DATA:  Left hip fracture.  Preoperative study. EXAM: PORTABLE CHEST 1 VIEW COMPARISON:  Chest x-ray dated June 17, 2016. FINDINGS: The patient remains rotated to the left. Stable mild cardiomegaly. Normal pulmonary vascularity. No focal consolidation, pleural effusion, or pneumothorax. No acute osseous  abnormality. Unchanged thoracic dextroscoliosis status post Harrington rod placement. IMPRESSION: No active disease. Electronically Signed   By: Titus Dubin M.D.   On: 09/26/2018 11:43   Dg Hip Unilat With Pelvis 2-3 Views Left  Result Date: 09/26/2018 CLINICAL DATA:  Fall from bed on Thursday. EXAM: DG HIP (WITH OR WITHOUT PELVIS) 2-3V LEFT COMPARISON:  None. FINDINGS: Proximal femur fracture, likely basicervical, with impaction. Although 4 images were obtained they all have a similar projection in this patient who is rotated to the left left hip. CT could further evaluate if clinically needed. Osteoarthritis with asymmetric spurring and sclerosis. No evident pelvic ring fracture. Negative right hip. IMPRESSION: 1. Proximal left femur fracture, likely basicervical femoral neck fracture. 2. Left hip osteoarthritis. Electronically Signed   By: Monte Fantasia M.D.   On: 09/26/2018 08:26    All pertinent xrays, MRI, CT independently reviewed and interpreted  Positive ROS: All other systems have been reviewed and were otherwise negative with the exception of those mentioned in the HPI and as above.  Physical Exam: General: Alert, no acute distress Cardiovascular: No pedal edema Respiratory: No cyanosis, no use of accessory musculature GI: No organomegaly, abdomen is soft and non-tender Skin: No lesions in the area of chief complaint Neurologic: Sensation intact distally Psychiatric: Patient is competent for consent with normal mood and affect Lymphatic: No axillary or cervical lymphadenopathy  MUSCULOSKELETAL:  - pain with movement of the hip and extremity - skin intact - NVI distally - compartments soft  Assessment: Left femoral neck fracture Preexisting left hip DJD  Plan: - total hip replacement is recommended, patient are aware of r/b/a and wish to proceed - consent obtained - medical optimization per primary team - cardiology consult pending for bifascicular block - surgery  is planned for early afternoon tomorrow - NPO after midnight  Thank you for the consult and the opportunity to see Ms. Manfred Shirts Eduard Roux, MD [place] 3:19 PM

## 2018-09-26 NOTE — H&P (Signed)
History and Physical  Heidi Blevins BLT:[id number] DOB: 1951-04-22 DOA: 09/26/2018  PCP: [name], MD Patient coming from: Assisted living facility  I have personally briefly reviewed patient's old medical records in [place]   Chief Complaint: Left leg pain.  HPI: Heidi Blevins is a 68 y.o. female past medical history significant for bifascicular block, schizophrenia, arrhythmia, hypertension who presents to the emergency department complaining of left leg pain.  Patient reports she is been having left leg pain for the last week.  She fell a week ago, from the bed to the floor.  She was sleeping in her bed and the next thing that she remembers she was on the floor.  She had an x-ray performed and it was reported to be negative for fracture.  Today patient was not able to ambulate, go to the bathroom due to severe pain.  Denies chest pain, shortness of breath.  She is still current smoker.  She reports cough.  Evaluation in the ED: Sodium 138 potassium 4.0 BUN 0.9, calcium 11.6, white blood cell 18, hemoglobin 13, x-ray negative for acute pulmonary disease.  X-ray of the left hip: Proximal left femur fracture, likely basicervical femoral neck Fracture. Left hip osteoarthritis. CT  of the left hip;Mildly impacted fracture through the base of the left femoral neck. No other acute abnormality.  Review of Systems: All systems reviewed and apart from history of presenting illness, are negative.  Past Medical History:  Diagnosis Date  . Anxiety   . Arrhythmia   . Bifascicular block   . Depression   . Hypertension   . Insomnia   . Schizoaffective disorder   . Scoliosis   . Vitamin D deficiency    Past Surgical History:  Procedure Laterality Date  . BACK SURGERY     "as a child after she fell out of a tree"  . TONSILLECTOMY     Social History:  reports that she has been smoking cigarettes. She has a 10.00 pack-year smoking history. She has never used  smokeless tobacco. She reports that she does not drink alcohol or use drugs.   Allergies  Allergen Reactions  . Sulfonamide Derivatives Hives         Family History  Problem Relation Age of Onset  . CVA Mother   . Hypertension Mother   . Lupus Sister   . Hypertension Brother   . Peptic Ulcer Disease Father   . Colon cancer Neg Hx     Prior to Admission medications   Medication Sig Start Date End Date Taking? Authorizing Provider  acetaminophen (TYLENOL) 500 MG tablet Take 500 mg by mouth every morning.    Yes [provider]  acetaminophen (TYLENOL) 500 MG tablet Take 500 mg by mouth 3 (three) times daily as needed for mild pain or headache. Atleast 4 hours apart from scheduled dose   Yes [provider]  aspirin EC 81 MG EC tablet Take 1 tablet (81 mg total) by mouth daily. 05/06/16  Yes Patrecia Pour, Christean Grief, MD  benzocaine-resorcinol (VAGISIL) 5-2 % vaginal cream Place 1 application vaginally as needed for itching.   Yes [provider]  benztropine (COGENTIN) 1 MG tablet Take 1 mg by mouth 2 (two) times daily.   Yes [provider]  Cranberry 500 MG TABS Take 500 mg by mouth 2 (two) times daily.    Yes [provider]  ENSURE (ENSURE) Take 237 mLs by mouth 2 (two) times daily. Strawberry  Yes [provider]  ferrous [id number] C-folic acid (FEROCON) capsule Take 1 capsule by mouth at bedtime.   Yes [provider]  fluPHENAZine (PROLIXIN) 10 MG tablet Take 20 mg by mouth daily.    Yes [provider]  LATUDA 120 MG TABS Take 120 mg by mouth daily. 09/11/18  Yes [provider]  lisinopril (PRINIVIL,ZESTRIL) 5 MG tablet Take 5 mg by mouth daily. 09/11/18  Yes [provider]  lithium carbonate 150 MG capsule Take 150-300 mg by mouth 2 (two) times daily. 300mg  at 8am 150mg  at 8pm 09/11/18  Yes [provider]  loratadine (CLARITIN) 10 MG tablet Take 10 mg by mouth daily.   Yes  [provider]  LORazepam (ATIVAN) 0.5 MG tablet Take 0.5 mg by mouth 4 (four) times daily. 09/23/18  Yes [provider]  LORazepam (ATIVAN) 1 MG tablet Take 0.5 mg by mouth 2 (two) times daily as needed for anxiety.   Yes [provider]  Melatonin 5 MG TABS Take 5 mg by mouth at bedtime.   Yes [provider]  metoprolol tartrate (LOPRESSOR) 25 MG tablet Take 1 tablet (25 mg total) by mouth 2 (two) times daily. 05/06/16  Yes Patrecia Pour, Christean Grief, MD  Nutritional Supplements (NUTRITIONAL SHAKE PLUS PO) Take 1 Container by mouth 3 (three) times daily.   Yes [provider]  Propylene Glycol (SYSTANE BALANCE) 0.6 % SOLN Place 1 drop into both eyes 3 (three) times daily.    Yes [provider]  RISPERDAL CONSTA 37.5 MG injection Inject 37.5 mg into the skin as directed. Every two weeks 09/16/18  Yes [provider]  sertraline (ZOLOFT) 100 MG tablet Take 100 mg by mouth daily.    Yes [provider]  traMADol (ULTRAM) 50 MG tablet Take 25 mg by mouth every 8 (eight) hours as needed for pain. 09/16/18  Yes [provider]  amoxicillin-clavulanate (AUGMENTIN) 875-125 MG tablet Take 1 tablet by mouth every 12 (twelve) hours. Last dose 06/19/2016. Patient not taking: Reported on 09/26/2018 06/18/16   Mariel Aloe, MD  lisinopril (PRINIVIL,ZESTRIL) 10 MG tablet Take 1 tablet (10 mg total) by mouth daily. Patient not taking: Reported on 09/26/2018 06/18/16   Mariel Aloe, MD   Physical Exam: Vitals:   09/26/18 5366 09/26/18 1030 09/26/18 1125  BP: (!) 152/73  (!) 151/75  Pulse: 66 77 83  Resp: 16 (!) 24 (!) 25  Temp: 98.6 F (37 C)    TempSrc: Oral    SpO2: 96% 97% 99%     General exam: Moderately built and nourished patient, lying comfortably supine on the gurney in no obvious distress.  Head, eyes and ENT: Nontraumatic and normocephalic. Pupils equally reacting to light and accommodation. Oral mucosa dry, white  plaque on her tongue  Neck: Supple. No JVD, carotid bruit or thyromegaly.  Lymphatics: No lymphadenopathy.  Respiratory system: Lateral rhonchus sporadic wheezing . no increased work of breathing.  Cardiovascular system: S1 and S2 heard, RRR. No JVD, murmurs, gallops, clicks or pedal edema.  Gastrointestinal system: Abdomen is nondistended, soft and nontender. Normal bowel sounds heard. No organomegaly or masses appreciated.  Central nervous system: Alert and oriented. No focal neurological deficits.  Extremities: . Peripheral pulses symmetrically felt.  Left leg shorter than right  Skin: No rashes or acute findings.  Musculoskeletal system: Negative exam.  Psychiatry: Pleasant and cooperative.   Labs on Admission:  Basic Metabolic Panel: Recent Labs  Lab 09/26/18 0910  NA  138  K 4.0  CL 106  CO2 24  GLUCOSE 125*  BUN 15  CREATININE 0.91  CALCIUM 11.6*   Liver Function Tests: No results for input(s): AST, ALT, ALKPHOS, BILITOT, PROT, ALBUMIN in the last 168 hours. No results for input(s): LIPASE, AMYLASE in the last 168 hours. No results for input(s): AMMONIA in the last 168 hours. CBC: Recent Labs  Lab 09/26/18 0910  WBC 18.3*  NEUTROABS 16.1*  HGB 13.0  HCT 41.9  MCV 99.1  PLT 358   Cardiac Enzymes: No results for input(s): CKTOTAL, CKMB, CKMBINDEX, TROPONINI in the last 168 hours.  BNP (last 3 results) No results for input(s): PROBNP in the last 8760 hours. CBG: No results for input(s): GLUCAP in the last 168 hours.  Radiological Exams on Admission: Dg Lumbar Spine Complete  Result Date: 09/26/2018 CLINICAL DATA:  Fall.  Back pain. EXAM: LUMBAR SPINE - COMPLETE 4+ VIEW COMPARISON:  05/16/2016 FINDINGS: Five lumbar type vertebral bodies. Lumbar levoscoliosis with Harrington rod in place and partially covered, spanning L2 superiorly. There is ankylosis/fusion of the spanned levels. Subjacent severe degenerative disease which limits radiographic  evaluation. No acute finding seen in the lumbar spine. Known left proximal femur fracture and left hip osteoarthritis. IMPRESSION: 1. No acute finding in the lumbar spine. 2. Scoliosis with Harrington rod fusion. There is advanced lumbar degenerative disease below the fusion. 3. Known left proximal femur fracture. Electronically Signed   By: Monte Fantasia M.D.   On: 09/26/2018 08:31   Ct Hip Left Wo Contrast  Result Date: 09/26/2018 CLINICAL DATA:  Left hip pain since a fall out of bed 09/19/2018. Initial encounter. EXAM: CT OF THE LEFT HIP WITHOUT CONTRAST TECHNIQUE: Multidetector CT imaging of the left hip was performed according to the standard protocol. Multiplanar CT image reconstructions were also generated. COMPARISON:  Plain films left hip earlier today. FINDINGS: Bones/Joint/Cartilage As seen on the comparison exam, the patient has a fracture through the base of the left femoral neck with impaction of approximately 1.5 cm. No extension along the intertrochanteric ridge is identified. No other fracture is seen. Moderately severe left hip degenerative change is present with subchondral cyst formation and osteophytosis identified. Loss of disc space height and vacuum disc phenomenon are seen at L3-4, L4-5 and L5-S1. No lytic or sclerotic lesion is identified. Ligaments Suboptimally assessed by CT. Muscles and Tendons Intact. Soft tissues Imaged intrapelvic contents demonstrate atherosclerotic vascular disease. IMPRESSION: Mildly impacted fracture through the base of the left femoral neck. No other acute abnormality. Moderately severe left hip osteoarthritis. Lower lumbar spondylosis. Atherosclerosis. Electronically Signed   By: Inge Rise M.D.   On: 09/26/2018 10:07   Dg Chest Port 1 View  Result Date: 09/26/2018 CLINICAL DATA:  Left hip fracture.  Preoperative study. EXAM: PORTABLE CHEST 1 VIEW COMPARISON:  Chest x-ray dated June 17, 2016. FINDINGS: The patient remains rotated to the left.  Stable mild cardiomegaly. Normal pulmonary vascularity. No focal consolidation, pleural effusion, or pneumothorax. No acute osseous abnormality. Unchanged thoracic dextroscoliosis status post Harrington rod placement. IMPRESSION: No active disease. Electronically Signed   By: Titus Dubin M.D.   On: 09/26/2018 11:43   Dg Hip Unilat With Pelvis 2-3 Views Left  Result Date: 09/26/2018 CLINICAL DATA:  Fall from bed on Thursday. EXAM: DG HIP (WITH OR WITHOUT PELVIS) 2-3V LEFT COMPARISON:  None. FINDINGS: Proximal femur fracture, likely basicervical, with impaction. Although 4 images were obtained they all have a similar projection in this patient who is rotated  to the left left hip. CT could further evaluate if clinically needed. Osteoarthritis with asymmetric spurring and sclerosis. No evident pelvic ring fracture. Negative right hip. IMPRESSION: 1. Proximal left femur fracture, likely basicervical femoral neck fracture. 2. Left hip osteoarthritis. Electronically Signed   By: Monte Fantasia M.D.   On: 09/26/2018 08:26    EKG: Independently reviewed.  Right bundle branch block, left anterior fascicular block.  Assessment/Plan Active Problems:   Schizoaffective disorder (HCC)   Bifascicular block   Essential hypertension   Hip fracture (HCC)  1-Left Hip fracture;  Patient will require surgery.  Dr. Erlinda Hong with Ortho is recommending  for patient to be transferred to [facility] for surgery. We will ask cardiology for preop clearance due to his bifascicular block. Hold on anticoagulation for DVT prophylaxis in anticipation of surgery. We will also hold baby aspirin. IV tylenol, Fentanyl for pain.  2-Bifascicular block Will hold metoprolol, await cardiology evaluation.Marland Kitchen  3-schizophrenia; Continue with current dentine, Latuda, lithium, Ativan.  4-possible COPD;  Patient is a current smoker, she has bilateral rhonchus and wheezing.  I will start nebulizer treatment..  She will need pulmonary  function tests as an outpatient.  5-hypertension; Hold lisinopril prior to surgery.  Will order PRN hydralazine.   6-Leukocytosis; chest x ray negative.  Check UA> . Follow trend.   DVT Prophylaxis: SCD for now/  Code Status: Full code.  Family Communication: Son at bedside.  Disposition Plan: Admit to [facility] [place] for hip surgery   Time spent: 75 minutes.   Elmarie Shiley MD [facility]   If 7PM-7AM, please contact night-coverage [email or web address] Password [email or web address]  09/26/2018, 12:16 PM

## 2018-09-26 NOTE — ED Notes (Signed)
Patient sleeping at this time.

## 2018-09-26 NOTE — ED Notes (Signed)
[facility] called. 

## 2018-09-26 NOTE — ED Notes (Signed)
Bed: WA03 Expected date:  Expected time:  Means of arrival:  Comments: EMS hip pain

## 2018-09-26 NOTE — ED Provider Notes (Addendum)
Heidi Blevins DEPT Provider Note   CSN: [id number] Arrival date & time: 09/26/18  6834     History   Chief Complaint Chief Complaint  Patient presents with  . Back Pain  . Leg Pain    HPI Heidi Blevins is a 68 y.o. female.  HPI Presents with concern of low back and left hip pain. Patient does have multiple medical issues, lives in a facility, but typically is ambulatory, without a walker or cane. Patient fell almost 1 week ago, from her bed to the floor. Since that time she has had change in ambulatory status, with inability to walk. Reportedly the patient did have x-rays performed, though it is unclear what those results were, and which anatomic areas were evaluated. She notes that since that fall, she has had persistent pain in the low back, left hip, worse with activity, attempts at motion. Today, the patient had inability to go to the bathroom, losing control of her bladder, prior to being taken there, secondary to pain limiting her transport status. She denies other new complaints, acknowledges a history of multiple medical issues, including scoliosis with prior surgical repair. It is unclear if she is taking pain medication for relief.  Past Medical History:  Diagnosis Date  . Anxiety   . Arrhythmia   . Bifascicular block   . Depression   . Hypertension   . Insomnia   . Schizoaffective disorder   . Scoliosis   . Vitamin D deficiency     Patient Active Problem List   Diagnosis Date Noted  . AKI (acute kidney injury) ([place]) 06/13/2016  . UTI (urinary tract infection) 06/13/2016  . Essential hypertension 06/13/2016  . Urinary tract infection without hematuria   . Complicated UTI (urinary tract infection) 05/03/2016  . Hypercalcemia 05/03/2016  . Dehydration 05/03/2016  . Acute encephalopathy 05/03/2016  . 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 Schizoaffective disorder (Grandfather) 05/03/2016  . Bifascicular block 05/03/2016     Past Surgical History:  Procedure Laterality Date  . BACK SURGERY     "as a child after she fell out of a tree"  . TONSILLECTOMY       OB History   No obstetric history on file.      Home Medications    Prior to Admission medications   Medication Sig Start Date End Date Taking? Authorizing Provider  acetaminophen (TYLENOL) 500 MG tablet Take 500 mg by mouth every morning.    Yes [provider]  acetaminophen (TYLENOL) 500 MG tablet Take 500 mg by mouth 3 (three) times daily as needed for mild pain or headache. Atleast 4 hours apart from scheduled dose   Yes [provider]  aspirin EC 81 MG EC tablet Take 1 tablet (81 mg total) by mouth daily. 05/06/16  Yes Heidi Blevins, Heidi Grief, MD  benzocaine-resorcinol (VAGISIL) 5-2 % vaginal cream Place 1 application vaginally as needed for itching.   Yes [provider]  benztropine (COGENTIN) 1 MG tablet Take 1 mg by mouth 2 (two) times daily.   Yes [provider]  Cranberry 500 MG TABS Take 500 mg by mouth 2 (two) times daily.    Yes [provider]  ENSURE (ENSURE) Take 237 mLs by mouth 2 (two) times daily. Strawberry   Yes [provider]  ferrous [id number] C-folic acid (FEROCON) capsule Take 1 capsule by mouth at bedtime.   Yes [provider]  fluPHENAZine (PROLIXIN) 10 MG tablet Take 20 mg by mouth daily.  Yes [provider]  LATUDA 120 MG TABS Take 120 mg by mouth daily. 09/11/18  Yes [provider]  lisinopril (PRINIVIL,ZESTRIL) 5 MG tablet Take 5 mg by mouth daily. 09/11/18  Yes [provider]  lithium carbonate 150 MG capsule Take 150-300 mg by mouth 2 (two) times daily. 300mg  at 8am 150mg  at 8pm 09/11/18  Yes [provider]  loratadine (CLARITIN) 10 MG tablet Take 10 mg by mouth daily.   Yes [provider]  LORazepam (ATIVAN) 0.5 MG tablet Take 0.5 mg by mouth 4 (four) times daily. 09/23/18  Yes [provider]  LORazepam (ATIVAN) 1 MG tablet Take 0.5 mg by mouth 2 (two) times daily as needed for anxiety.   Yes [provider]  Melatonin 5 MG TABS Take 5 mg by mouth at bedtime.   Yes [provider]  metoprolol tartrate (LOPRESSOR) 25 MG tablet Take 1 tablet (25 mg total) by mouth 2 (two) times daily. 05/06/16  Yes Heidi Blevins, Heidi Grief, MD  Nutritional Supplements (NUTRITIONAL SHAKE PLUS PO) Take 1 Container by mouth 3 (three) times daily.   Yes [provider]  Propylene Glycol (SYSTANE BALANCE) 0.6 % SOLN Place 1 drop into both eyes 3 (three) times daily.    Yes [provider]  RISPERDAL CONSTA 37.5 MG injection Inject 37.5 mg into the skin as directed. Every two weeks 09/16/18  Yes [provider]  sertraline (ZOLOFT) 100 MG tablet Take 100 mg by mouth daily.    Yes [provider]  traMADol (ULTRAM) 50 MG tablet Take 25 mg by mouth every 8 (eight) hours as needed for pain. 09/16/18  Yes [provider]  amoxicillin-clavulanate (AUGMENTIN) 875-125 MG tablet Take 1 tablet by mouth every 12 (twelve) hours. Last dose 06/19/2016. Patient not taking: Reported on 09/26/2018 06/18/16   Mariel Aloe, MD  lisinopril (PRINIVIL,ZESTRIL) 10 MG tablet Take 1 tablet (10 mg total) by mouth daily. Patient not taking: Reported on 09/26/2018 06/18/16   Mariel Aloe, MD    Family History Family History  Problem Relation Age of Onset  . CVA Mother   . Hypertension Mother   . Lupus Sister   . Hypertension Brother   . Peptic Ulcer Disease Father   . Colon cancer Neg Hx     Social History Social History   Tobacco Use  . Smoking status: Current Every Day Smoker    Packs/day: 0.25    Years: 40.00    Pack years: 10.00    Types: Cigarettes  . Smokeless tobacco: Never Used  Substance Use Topics  . Alcohol use: No  . Drug use: No     Allergies   Sulfonamide derivatives   Review of Systems Review of Systems  Constitutional:        Per HPI, otherwise negative  HENT:       Per HPI, otherwise negative  Respiratory:       Per HPI, otherwise negative  Cardiovascular:       Per HPI, otherwise negative  Gastrointestinal: Negative for vomiting.  Endocrine:       Negative aside from HPI  Genitourinary:       Neg aside from HPI   Musculoskeletal:       Per HPI, otherwise negative  Skin: Negative.   Neurological: Negative for syncope.     Physical Exam Updated Vital Signs BP (!) 152/73 (BP Location: Right Arm)   Pulse 66   Temp 98.6 F (37 C) (Oral)  Resp 16   SpO2 96% Comment: Simultaneous filing. User may not have seen previous data.  Physical Exam Vitals signs and nursing note reviewed.  Constitutional:      General: She is not in acute distress.    Appearance: She is well-developed.  HENT:     Head: Normocephalic and atraumatic.  Eyes:     Conjunctiva/sclera: Conjunctivae normal.  Cardiovascular:     Rate and Rhythm: Normal rate and regular rhythm.  Pulmonary:     Effort: Pulmonary effort is normal. No respiratory distress.     Breath sounds: Normal breath sounds. No stridor.  Abdominal:     General: There is no distension.  Musculoskeletal:     Right hip: Normal.     Left hip: She exhibits decreased range of motion, tenderness and bony tenderness.     Right knee: Normal.     Left knee: Normal.       Legs:  Skin:    General: Skin is warm and dry.  Neurological:     Mental Status: She is alert and oriented to person, place, and time.     Cranial Nerves: No cranial nerve deficit.      ED Treatments / Results  Labs (all labs ordered are listed, but only abnormal results are displayed) Labs Reviewed  BASIC METABOLIC PANEL - Abnormal; Notable for the following components:      Result Value   Glucose, Bld 125 (*)    Calcium 11.6 (*)    All other components within normal limits  CBC WITH DIFFERENTIAL/PLATELET - Abnormal; Notable for the following components:   WBC 18.3 (*)     Neutro Abs 16.1 (*)    Abs Immature Granulocytes 0.12 (*)    All other components within normal limits  PROTIME-INR  TYPE AND SCREEN    EKG EKG Interpretation  Date/Time:  Thursday September 26 2018 09:09:44 EST Ventricular Rate:  74 PR Interval:    QRS Duration: 157 QT Interval:  422 QTC Calculation: 469 R Axis:   -72 Text Interpretation:  Sinus rhythm RBBB and LAFB No significant change since last tracing Confirmed by Duffy Bruce [id number]) on 09/26/2018 9:17:24 AM   Radiology Dg Lumbar Spine Complete  Result Date: 09/26/2018 CLINICAL DATA:  Fall.  Back pain. EXAM: LUMBAR SPINE - COMPLETE 4+ VIEW COMPARISON:  05/16/2016 FINDINGS: Five lumbar type vertebral bodies. Lumbar levoscoliosis with Harrington rod in place and partially covered, spanning L2 superiorly. There is ankylosis/fusion of the spanned levels. Subjacent severe degenerative disease which limits radiographic evaluation. No acute finding seen in the lumbar spine. Known left proximal femur fracture and left hip osteoarthritis. IMPRESSION: 1. No acute finding in the lumbar spine. 2. Scoliosis with Harrington rod fusion. There is advanced lumbar degenerative disease below the fusion. 3. Known left proximal femur fracture. Electronically Signed   By: Monte Fantasia M.D.   On: 09/26/2018 08:31   Ct Hip Left Wo Contrast  Result Date: 09/26/2018 CLINICAL DATA:  Left hip pain since a fall out of bed 09/19/2018. Initial encounter. EXAM: CT OF THE LEFT HIP WITHOUT CONTRAST TECHNIQUE: Multidetector CT imaging of the left hip was performed according to the standard protocol. Multiplanar CT image reconstructions were also generated. COMPARISON:  Plain films left hip earlier today. FINDINGS: Bones/Joint/Cartilage As seen on the comparison exam, the patient has a fracture through the base of the left femoral neck with impaction of approximately 1.5 cm. No extension along the intertrochanteric ridge is identified. No other fracture is seen.  Moderately severe left hip degenerative change is present with subchondral cyst formation and osteophytosis identified. Loss of disc space height and vacuum disc phenomenon are seen at L3-4, L4-5 and L5-S1. No lytic or sclerotic lesion is identified. Ligaments Suboptimally assessed by CT. Muscles and Tendons Intact. Soft tissues Imaged intrapelvic contents demonstrate atherosclerotic vascular disease. IMPRESSION: Mildly impacted fracture through the base of the left femoral neck. No other acute abnormality. Moderately severe left hip osteoarthritis. Lower lumbar spondylosis. Atherosclerosis. Electronically Signed   By: Inge Rise M.D.   On: 09/26/2018 10:07   Dg Hip Unilat With Pelvis 2-3 Views Left  Result Date: 09/26/2018 CLINICAL DATA:  Fall from bed on Thursday. EXAM: DG HIP (WITH OR WITHOUT PELVIS) 2-3V LEFT COMPARISON:  None. FINDINGS: Proximal femur fracture, likely basicervical, with impaction. Although 4 images were obtained they all have a similar projection in this patient who is rotated to the left left hip. CT could further evaluate if clinically needed. Osteoarthritis with asymmetric spurring and sclerosis. No evident pelvic ring fracture. Negative right hip. IMPRESSION: 1. Proximal left femur fracture, likely basicervical femoral neck fracture. 2. Left hip osteoarthritis. Electronically Signed   By: Monte Fantasia M.D.   On: 09/26/2018 08:26    Procedures Procedures (including critical care time)  Medications Ordered in ED Medications  fentaNYL (SUBLIMAZE) injection 25 mcg (25 mcg Intravenous Given 09/26/18 0920)  acetaminophen (TYLENOL) tablet 500 mg (500 mg Oral Given 09/26/18 0915)     Initial Impression / Assessment and Plan / ED Course  I have reviewed the triage vital signs and the nursing notes.  Pertinent labs & imaging results that were available during my care of the patient were reviewed by me and considered in my medical decision making (see chart for details).      Update: Patient now company by her son. I discussed all findings, reviewed x-ray concern for fracture with them. With need for greater definition, CT scan ordered.  Update: I discussed the patient's case with our orthopedist on-call, with concern for femoral neck fracture, patient will be admitted for further monitoring, management, surgical repair. With lack of definition on x-ray, CT scan will be performed.  10:29 AM Remains in similar condition, now with less pain. She is aware of all findings, as is her family member. With CT evidence consistent with x-ray findings concerning for fracture, the patient will require admission to the hospitalist service for further monitoring and management. Patient's labs generally reassuring, she does have mild leukocytosis, this may be secondary to her fracture which is possibly been present for 1 week.  Patient has no fever, no abdominal pain, no complaints of dysuria, no other obvious source of infection, though urinalysis is pending on admission.  The request of our hospitalist colleagues I contacted our cardiology colleagues for assistance with medical clearance for surgery, they will follow as a consulting team. Final Clinical Impressions(s) / ED Diagnoses  Fall, initial encounter Left hip fracture   Carmin Muskrat, MD 09/26/18 1031    Carmin Muskrat, MD 09/26/18 1141

## 2018-09-26 NOTE — ED Notes (Signed)
Report given 5N nurse

## 2018-09-26 NOTE — ED Triage Notes (Signed)
Pt from [facility] assisted living by EMS. Pt c/o fall last Thursday from bed to floor. Pt hit her back on the way down. Had xray done in facility. Facility staff says pt normally doesn't use a walker or assistance to ambulate but today required much assistance and urinated herself trying to get to the restroom.   Pt states pain is in low back, L hip and radiates down left leg. Worse with exertion or ambulation.

## 2018-09-27 ENCOUNTER — Inpatient Hospital Stay (HOSPITAL_COMMUNITY): Payer: Medicare Other

## 2018-09-27 ENCOUNTER — Encounter (HOSPITAL_COMMUNITY)
Admission: EM | Disposition: A | Discharge status: Skilled Nursing Facility | Payer: Self-pay | Source: Home / Self Care | Attending: Internal Medicine

## 2018-09-27 ENCOUNTER — Encounter (HOSPITAL_COMMUNITY): Payer: Self-pay

## 2018-09-27 ENCOUNTER — Inpatient Hospital Stay (HOSPITAL_COMMUNITY): Payer: Medicare Other | Admitting: Anesthesiology

## 2018-09-27 DIAGNOSIS — F258 Other schizoaffective disorders: Secondary | ICD-10-CM

## 2018-09-27 DIAGNOSIS — S72042A Displaced fracture of base of neck of left femur, initial encounter for closed fracture: Secondary | ICD-10-CM

## 2018-09-27 DIAGNOSIS — I452 Bifascicular block: Secondary | ICD-10-CM

## 2018-09-27 DIAGNOSIS — I10 Essential (primary) hypertension: Secondary | ICD-10-CM

## 2018-09-27 HISTORY — PX: TOTAL HIP ARTHROPLASTY: SHX124

## 2018-09-27 LAB — CBC
HCT: 36.1 % (ref 36.0–46.0)
Hemoglobin: 11.7 g/dL — ABNORMAL LOW (ref 12.0–15.0)
MCH: 31.2 pg (ref 26.0–34.0)
MCHC: 32.4 g/dL (ref 30.0–36.0)
MCV: 96.3 fL (ref 80.0–100.0)
Platelets: 321 10*3/uL (ref 150–400)
RBC: 3.75 MIL/uL — ABNORMAL LOW (ref 3.87–5.11)
RDW: 12.5 % (ref 11.5–15.5)
WBC: 16.9 10*3/uL — ABNORMAL HIGH (ref 4.0–10.5)
nRBC: 0 % (ref 0.0–0.2)

## 2018-09-27 LAB — BASIC METABOLIC PANEL
Anion gap: 9 (ref 5–15)
BUN: 14 mg/dL (ref 8–23)
CO2: 21 mmol/L — ABNORMAL LOW (ref 22–32)
Calcium: 10.7 mg/dL — ABNORMAL HIGH (ref 8.9–10.3)
Chloride: 112 mmol/L — ABNORMAL HIGH (ref 98–111)
Creatinine, Ser: 1 mg/dL (ref 0.44–1.00)
GFR calc Af Amer: >60 mL/min (ref >60)
GFR calc non Af Amer: 58 mL/min — ABNORMAL LOW (ref >60)
Glucose, Bld: 124 mg/dL — ABNORMAL HIGH (ref 70–99)
Potassium: 3.8 mmol/L (ref 3.5–5.1)
Sodium: 142 mmol/L (ref 135–145)

## 2018-09-27 LAB — HIV ANTIBODY (ROUTINE TESTING W REFLEX): HIV Screen 4th Generation wRfx: NONREACTIVE

## 2018-09-27 SURGERY — ARTHROPLASTY, HIP, TOTAL, ANTERIOR APPROACH
Anesthesia: General | Site: Hip | Laterality: Left

## 2018-09-27 MED ORDER — KETOROLAC TROMETHAMINE 15 MG/ML IJ SOLN
15.0000 mg | Freq: Four times a day (QID) | INTRAMUSCULAR | Status: AC
  Administered 2018-09-27 – 2018-09-28 (×4): 15 mg via INTRAVENOUS
  Filled 2018-09-27 (×4): qty 1

## 2018-09-27 MED ORDER — POVIDONE-IODINE 10 % EX SWAB
2.0000 "application " | Freq: Once | CUTANEOUS | Status: AC
  Administered 2018-09-27: 2 via TOPICAL

## 2018-09-27 MED ORDER — 0.9 % SODIUM CHLORIDE (POUR BTL) OPTIME
TOPICAL | Status: DC | PRN
  Administered 2018-09-27: 1000 mL

## 2018-09-27 MED ORDER — FENTANYL CITRATE (PF) 250 MCG/5ML IJ SOLN
INTRAMUSCULAR | Status: AC
  Filled 2018-09-27: qty 5

## 2018-09-27 MED ORDER — LACTATED RINGERS IV SOLN
INTRAVENOUS | Status: DC
  Administered 2018-09-27 (×2): via INTRAVENOUS

## 2018-09-27 MED ORDER — HYDROCODONE-ACETAMINOPHEN 5-325 MG PO TABS
1.0000 | ORAL_TABLET | ORAL | Status: DC | PRN
  Administered 2018-09-28 – 2018-09-30 (×4): 1 via ORAL
  Filled 2018-09-27 (×4): qty 1

## 2018-09-27 MED ORDER — OXYCODONE HCL 5 MG/5ML PO SOLN: 5.0000 mg | Freq: Once | ORAL | Status: DC | PRN

## 2018-09-27 MED ORDER — BUPIVACAINE LIPOSOME 1.3 % IJ SUSP
20.0000 mL | INTRAMUSCULAR | Status: DC
  Administered 2018-09-27: 266 mg
  Filled 2018-09-27: qty 20

## 2018-09-27 MED ORDER — ROCURONIUM BROMIDE 100 MG/10ML IV SOLN
INTRAVENOUS | Status: DC | PRN
  Administered 2018-09-27: 50 mg via INTRAVENOUS

## 2018-09-27 MED ORDER — FENTANYL CITRATE (PF) 100 MCG/2ML IJ SOLN: 25.0000 ug | INTRAMUSCULAR | Status: DC | PRN

## 2018-09-27 MED ORDER — HYDROCODONE-ACETAMINOPHEN 7.5-325 MG PO TABS
1.0000 | ORAL_TABLET | ORAL | Status: DC | PRN
  Administered 2018-09-29 – 2018-10-03 (×11): 1 via ORAL
  Filled 2018-09-27 (×12): qty 1

## 2018-09-27 MED ORDER — METHOCARBAMOL 1000 MG/10ML IJ SOLN
500.0000 mg | Freq: Four times a day (QID) | INTRAVENOUS | Status: DC | PRN
  Filled 2018-09-27: qty 5

## 2018-09-27 MED ORDER — ACETAMINOPHEN 325 MG PO TABS: 325.0000 mg | ORAL_TABLET | ORAL | Status: DC | PRN

## 2018-09-27 MED ORDER — PROPOFOL 10 MG/ML IV BOLUS
INTRAVENOUS | Status: DC | PRN
  Administered 2018-09-27: 100 mg via INTRAVENOUS
  Administered 2018-09-27: 30 mg via INTRAVENOUS

## 2018-09-27 MED ORDER — CEFAZOLIN SODIUM-DEXTROSE 2-4 GM/100ML-% IV SOLN
2.0000 g | INTRAVENOUS | Status: AC
  Administered 2018-09-27: 2 g via INTRAVENOUS
  Filled 2018-09-27: qty 100

## 2018-09-27 MED ORDER — MEPERIDINE HCL 50 MG/ML IJ SOLN: 6.2500 mg | INTRAMUSCULAR | Status: DC | PRN

## 2018-09-27 MED ORDER — FENTANYL CITRATE (PF) 250 MCG/5ML IJ SOLN
INTRAMUSCULAR | Status: DC | PRN
  Administered 2018-09-27: 25 ug via INTRAVENOUS
  Administered 2018-09-27 (×2): 50 ug via INTRAVENOUS
  Administered 2018-09-27 (×2): 25 ug via INTRAVENOUS
  Administered 2018-09-27: 50 ug via INTRAVENOUS
  Administered 2018-09-27: 25 ug via INTRAVENOUS
  Administered 2018-09-27: 50 ug via INTRAVENOUS
  Administered 2018-09-27 (×2): 25 ug via INTRAVENOUS

## 2018-09-27 MED ORDER — LISINOPRIL 5 MG PO TABS
5.0000 mg | ORAL_TABLET | Freq: Every day | ORAL | Status: DC
  Administered 2018-09-28 – 2018-10-03 (×6): 5 mg via ORAL
  Filled 2018-09-27 (×6): qty 1

## 2018-09-27 MED ORDER — METHOCARBAMOL 500 MG PO TABS
500.0000 mg | ORAL_TABLET | Freq: Four times a day (QID) | ORAL | Status: DC | PRN
  Administered 2018-09-29 – 2018-10-02 (×4): 500 mg via ORAL
  Filled 2018-09-27 (×4): qty 1

## 2018-09-27 MED ORDER — LIDOCAINE HCL (CARDIAC) PF 100 MG/5ML IV SOSY
PREFILLED_SYRINGE | INTRAVENOUS | Status: DC | PRN
  Administered 2018-09-27: 100 mg via INTRAVENOUS

## 2018-09-27 MED ORDER — MAGNESIUM CITRATE PO SOLN: 1.0000 | Freq: Once | ORAL | Status: DC | PRN

## 2018-09-27 MED ORDER — ONDANSETRON HCL 4 MG/2ML IJ SOLN: 4.0000 mg | Freq: Once | INTRAMUSCULAR | Status: DC | PRN

## 2018-09-27 MED ORDER — ENOXAPARIN SODIUM 40 MG/0.4ML ~~LOC~~ SOLN
40.0000 mg | SUBCUTANEOUS | Status: DC
  Administered 2018-09-28 – 2018-10-02 (×5): 40 mg via SUBCUTANEOUS
  Filled 2018-09-27 (×5): qty 0.4

## 2018-09-27 MED ORDER — SORBITOL 70 % SOLN: 30.0000 mL | Freq: Every day | Status: DC | PRN

## 2018-09-27 MED ORDER — ACETAMINOPHEN 500 MG PO TABS
500.0000 mg | ORAL_TABLET | Freq: Four times a day (QID) | ORAL | Status: AC
  Administered 2018-09-27 – 2018-09-28 (×4): 500 mg via ORAL
  Filled 2018-09-27 (×4): qty 1

## 2018-09-27 MED ORDER — ACETAMINOPHEN 160 MG/5ML PO SOLN: 325.0000 mg | ORAL | Status: DC | PRN

## 2018-09-27 MED ORDER — ALBUTEROL SULFATE (2.5 MG/3ML) 0.083% IN NEBU: 2.5000 mg | INHALATION_SOLUTION | RESPIRATORY_TRACT | Status: DC | PRN

## 2018-09-27 MED ORDER — CEFAZOLIN SODIUM-DEXTROSE 2-4 GM/100ML-% IV SOLN
2.0000 g | Freq: Four times a day (QID) | INTRAVENOUS | Status: AC
  Administered 2018-09-27 – 2018-09-28 (×3): 2 g via INTRAVENOUS
  Filled 2018-09-27 (×3): qty 100

## 2018-09-27 MED ORDER — ACETAMINOPHEN 325 MG PO TABS
325.0000 mg | ORAL_TABLET | Freq: Four times a day (QID) | ORAL | Status: DC | PRN
  Administered 2018-09-29: 325 mg via ORAL
  Filled 2018-09-27: qty 1

## 2018-09-27 MED ORDER — ENOXAPARIN SODIUM 40 MG/0.4ML ~~LOC~~ SOLN: 40.0000 mg | Freq: Every day | SUBCUTANEOUS | 0 refills | Status: DC

## 2018-09-27 MED ORDER — PHENOL 1.4 % MT LIQD: 1.0000 | OROMUCOSAL | Status: DC | PRN

## 2018-09-27 MED ORDER — HYDROCODONE-ACETAMINOPHEN 7.5-325 MG PO TABS: 1.0000 | ORAL_TABLET | Freq: Four times a day (QID) | ORAL | 0 refills | Status: DC | PRN

## 2018-09-27 MED ORDER — POLYETHYLENE GLYCOL 3350 17 G PO PACK: 17.0000 g | PACK | Freq: Every day | ORAL | Status: DC | PRN

## 2018-09-27 MED ORDER — SODIUM CHLORIDE 0.9 % IR SOLN
Status: DC | PRN
  Administered 2018-09-27: 1000 mL

## 2018-09-27 MED ORDER — DOCUSATE SODIUM 100 MG PO CAPS: 100.0000 mg | ORAL_CAPSULE | Freq: Two times a day (BID) | ORAL | Status: DC

## 2018-09-27 MED ORDER — ONDANSETRON HCL 4 MG/2ML IJ SOLN
INTRAMUSCULAR | Status: DC | PRN
  Administered 2018-09-27: 4 mg via INTRAVENOUS

## 2018-09-27 MED ORDER — OXYCODONE HCL 5 MG PO TABS: 5.0000 mg | ORAL_TABLET | Freq: Once | ORAL | Status: DC | PRN

## 2018-09-27 MED ORDER — PHENYLEPHRINE HCL 10 MG/ML IJ SOLN
INTRAMUSCULAR | Status: DC | PRN
  Administered 2018-09-27 (×3): 80 ug via INTRAVENOUS
  Administered 2018-09-27: 40 ug via INTRAVENOUS
  Administered 2018-09-27: 80 ug via INTRAVENOUS
  Administered 2018-09-27: 160 ug via INTRAVENOUS

## 2018-09-27 MED ORDER — ONDANSETRON HCL 4 MG/2ML IJ SOLN: 4.0000 mg | Freq: Four times a day (QID) | INTRAMUSCULAR | Status: DC | PRN

## 2018-09-27 MED ORDER — VANCOMYCIN HCL 1000 MG IV SOLR
INTRAVENOUS | Status: AC
  Filled 2018-09-27: qty 1000

## 2018-09-27 MED ORDER — ALUM & MAG HYDROXIDE-SIMETH 200-200-20 MG/5ML PO SUSP: 30.0000 mL | ORAL | Status: DC | PRN

## 2018-09-27 MED ORDER — MENTHOL 3 MG MT LOZG: 1.0000 | LOZENGE | OROMUCOSAL | Status: DC | PRN

## 2018-09-27 MED ORDER — TRANEXAMIC ACID 1000 MG/10ML IV SOLN
2000.0000 mg | INTRAVENOUS | Status: AC
  Administered 2018-09-27 (×2): 2000 mg via TOPICAL
  Filled 2018-09-27: qty 20

## 2018-09-27 MED ORDER — ESMOLOL HCL 100 MG/10ML IV SOLN
INTRAVENOUS | Status: DC | PRN
  Administered 2018-09-27 (×2): 10 mg via INTRAVENOUS

## 2018-09-27 MED ORDER — SODIUM CHLORIDE 0.9 % IV SOLN
INTRAVENOUS | Status: DC
  Administered 2018-09-27: 18:00:00 via INTRAVENOUS

## 2018-09-27 MED ORDER — SUGAMMADEX SODIUM 200 MG/2ML IV SOLN
INTRAVENOUS | Status: DC | PRN
  Administered 2018-09-27: 125 mg via INTRAVENOUS

## 2018-09-27 MED ORDER — ONDANSETRON HCL 4 MG PO TABS: 4.0000 mg | ORAL_TABLET | Freq: Four times a day (QID) | ORAL | Status: DC | PRN

## 2018-09-27 MED ORDER — MORPHINE SULFATE (PF) 2 MG/ML IV SOLN: 1.0000 mg | INTRAVENOUS | Status: DC | PRN

## 2018-09-27 SURGICAL SUPPLY — 57 items
BAG DECANTER FOR FLEXI CONT (MISCELLANEOUS) ×3 IMPLANT
CABLE CERLAGE W/CRIMP 1.8 (Cable) ×4 IMPLANT
CABLE CERLAGE W/CRIMP 1.8MM (Cable) ×2 IMPLANT
CELLS DAT CNTRL 66122 CELL SVR (MISCELLANEOUS) IMPLANT
COVER PERINEAL POST (MISCELLANEOUS) ×3 IMPLANT
COVER SURGICAL LIGHT HANDLE (MISCELLANEOUS) ×3 IMPLANT
COVER WAND RF STERILE (DRAPES) ×3 IMPLANT
CUP SECTOR GRIPTON 50MM (Cup) ×3 IMPLANT
DRAPE C-ARM 42X72 X-RAY (DRAPES) ×3 IMPLANT
DRAPE POUCH INSTRU U-SHP 10X18 (DRAPES) ×3 IMPLANT
DRAPE STERI IOBAN 125X83 (DRAPES) ×3 IMPLANT
DRAPE U-SHAPE 47X51 STRL (DRAPES) ×6 IMPLANT
DRSG AQUACEL AG ADV 3.5X10 (GAUZE/BANDAGES/DRESSINGS) ×3 IMPLANT
DURAPREP 26ML APPLICATOR (WOUND CARE) ×6 IMPLANT
ELECT BLADE 4.0 EZ CLEAN MEGAD (MISCELLANEOUS) ×3
ELECT REM PT RETURN 9FT ADLT (ELECTROSURGICAL) ×3
ELECTRODE BLDE 4.0 EZ CLN MEGD (MISCELLANEOUS) ×1 IMPLANT
ELECTRODE REM PT RTRN 9FT ADLT (ELECTROSURGICAL) ×1 IMPLANT
GLOVE BIOGEL PI IND STRL 7.0 (GLOVE) ×1 IMPLANT
GLOVE BIOGEL PI INDICATOR 7.0 (GLOVE) ×2
GLOVE ECLIPSE 7.0 STRL STRAW (GLOVE) ×6 IMPLANT
GLOVE SKINSENSE NS SZ7.5 (GLOVE) ×2
GLOVE SKINSENSE STRL SZ7.5 (GLOVE) ×1 IMPLANT
GLOVE SURG SYN 7.5  E (GLOVE) ×8
GLOVE SURG SYN 7.5 E (GLOVE) ×4 IMPLANT
GOWN SRG XL XLNG 56XLVL 4 (GOWN DISPOSABLE) ×1 IMPLANT
GOWN STRL NON-REIN XL XLG LVL4 (GOWN DISPOSABLE) ×2
GOWN STRL REUS W/ TWL LRG LVL3 (GOWN DISPOSABLE) IMPLANT
GOWN STRL REUS W/ TWL XL LVL3 (GOWN DISPOSABLE) ×1 IMPLANT
GOWN STRL REUS W/TWL LRG LVL3 (GOWN DISPOSABLE)
GOWN STRL REUS W/TWL XL LVL3 (GOWN DISPOSABLE) ×2
HANDPIECE INTERPULSE COAX TIP (DISPOSABLE) ×2
HEAD FEMORAL 32 CERAMIC (Hips) ×3 IMPLANT
HOOD PEEL AWAY FLYTE STAYCOOL (MISCELLANEOUS) ×6 IMPLANT
IV NS IRRIG 3000ML ARTHROMATIC (IV SOLUTION) ×3 IMPLANT
KIT BASIN OR (CUSTOM PROCEDURE TRAY) ×3 IMPLANT
LINER ACET PNNCL PLUS4 NEUTRAL (Hips) ×1 IMPLANT
MARKER SKIN DUAL TIP RULER LAB (MISCELLANEOUS) ×3 IMPLANT
NEEDLE SPNL 18GX3.5 QUINCKE PK (NEEDLE) ×3 IMPLANT
PACK TOTAL JOINT (CUSTOM PROCEDURE TRAY) ×3 IMPLANT
PACK UNIVERSAL I (CUSTOM PROCEDURE TRAY) ×3 IMPLANT
PINNACLE PLUS 4 NEUTRAL (Hips) ×3 IMPLANT
RTRCTR WOUND ALEXIS 18CM MED (MISCELLANEOUS)
SAW OSC TIP CART 19.5X105X1.3 (SAW) ×3 IMPLANT
SCREW 6.5MMX25MM (Screw) ×3 IMPLANT
SET HNDPC FAN SPRY TIP SCT (DISPOSABLE) ×1 IMPLANT
STAPLER VISISTAT 35W (STAPLE) IMPLANT
STEM CORAIL KA13 (Stem) ×3 IMPLANT
SUT ETHIBOND 2 V 37 (SUTURE) ×3 IMPLANT
SUT VIC AB 1 CT1 27 (SUTURE) ×2
SUT VIC AB 1 CT1 27XBRD ANBCTR (SUTURE) ×1 IMPLANT
SUT VIC AB 2-0 CT1 27 (SUTURE) ×4
SUT VIC AB 2-0 CT1 TAPERPNT 27 (SUTURE) ×2 IMPLANT
SYRINGE 60CC LL (MISCELLANEOUS) ×3 IMPLANT
TOWEL OR 17X26 10 PK STRL BLUE (TOWEL DISPOSABLE) ×3 IMPLANT
TRAY CATH 16FR W/PLASTIC CATH (SET/KITS/TRAYS/PACK) IMPLANT
YANKAUER SUCT BULB TIP NO VENT (SUCTIONS) ×3 IMPLANT

## 2018-09-27 NOTE — Anesthesia Preprocedure Evaluation (Signed)
Anesthesia Evaluation  Patient identified by MRN, date of birth, ID band Patient awake    Reviewed: Allergy & Precautions, H&P , NPO status , Patient's Chart, lab work & pertinent test results, reviewed documented beta blocker date and time   Airway Mallampati: II  TM Distance: >3 FB Neck ROM: full    Dental no notable dental hx.    Pulmonary neg pulmonary ROS, Current Smoker,    Pulmonary exam normal breath sounds clear to auscultation       Cardiovascular Exercise Tolerance: Good hypertension, Pt. on medications and Pt. on home beta blockers + dysrhythmias  Rhythm:regular Rate:Normal     Neuro/Psych PSYCHIATRIC DISORDERS Anxiety Depression Schizophrenia negative neurological ROS     GI/Hepatic negative GI ROS, Neg liver ROS,   Endo/Other  negative endocrine ROS  Renal/GU ARFRenal disease  negative genitourinary   Musculoskeletal   Abdominal   Peds  Hematology negative hematology ROS (+)   Anesthesia Other Findings   Reproductive/Obstetrics negative OB ROS                             Anesthesia Physical Anesthesia Plan  ASA: III  Anesthesia Plan: General   Post-op Pain Management:    Induction:   PONV Risk Score and Plan: 3 and Ondansetron and Treatment may vary due to age or medical condition  Airway Management Planned: Oral ETT and LMA  Additional Equipment:   Intra-op Plan:   Post-operative Plan: Extubation in OR  Informed Consent: I have reviewed the patients History and Physical, chart, labs and discussed the procedure including the risks, benefits and alternatives for the proposed anesthesia with the patient or authorized representative who has indicated his/her understanding and acceptance.     Dental Advisory Given  Plan Discussed with: CRNA, Anesthesiologist and Surgeon  Anesthesia Plan Comments:         Anesthesia Quick Evaluation

## 2018-09-27 NOTE — Op Note (Signed)
LEFT TOTAL HIP ARTHROPLASTY ANTERIOR APPROACH  Procedure Note [name]   [id number]  Pre-op Diagnosis: Left femoral neck fracture, degenerative hip disease     Post-op Diagnosis: same   Operative Procedures  1. Total hip replacement; Left hip; uncemented [id number]   Personnel  Surgeon(s): Heidi Koyanagi, MD  Assist: Madalyn Rob, PA-C; necessary for the timely completion of procedure and due to complexity of procedure.   Anesthesia: general  Prosthesis: Depuy Acetabulum: Pinnacle 50 mm Femur: Corail KA 13 Head: 32 mm size: +1 Liner: +4 neutral Bearing Type: ceramic on poly  Total Hip Arthroplasty (Anterior Approach) Op Note:  After informed consent was obtained and the operative extremity marked in the holding area, the patient was brought back to the operating room and placed supine on the HANA table. Next, the operative extremity was prepped and draped in normal sterile fashion. Surgical timeout occurred verifying patient identification, surgical site, surgical procedure and administration of antibiotics.  A modified anterior Smith-Peterson approach to the hip was performed, using the interval between tensor fascia lata and sartorius.  Dissection was carried bluntly down onto the anterior hip capsule. The lateral femoral circumflex vessels were identified and coagulated. A capsulotomy was performed and the capsular flaps tagged for later repair.  Fluoroscopy was utilized to prepare for the femoral neck cut. The neck osteotomy was performed. The femoral head was removed, the acetabular rim was cleared of soft tissue and attention was turned to reaming the acetabulum.  Sequential reaming was performed under fluoroscopic guidance. We reamed to a size 49 mm, and then impacted the acetabular shell. A 25 mm cancellous screw was placed for added fixation.  This had excellent purchase.  The liner was then placed after irrigation and attention turned to the femur.  After  placing the femoral hook, the leg was taken to externally rotated, extended and adducted position taking care to perform soft tissue releases to allow for adequate mobilization of the femur. Soft tissue was cleared from the shoulder of the greater trochanter and the hook elevator used to improve exposure of the proximal femur.  The fracture line propagated into the posterior femoral canal onto the lesser trochanter.  Therefore, I placed two prophylactic cerclage cables around the femur; one superior to the lesser trochanter and one inferior to the lesser trochanter.  Sequential broaching performed up to a size 13. Trial neck and head were placed. The trial broach had excellent fit and fill of the canal without any displacement of the fracture.  The leg was brought back up to neutral and the construct reduced. The position and sizing of components, offset and leg lengths were checked using fluoroscopy. Stability of the construct was checked in extension and external rotation without any subluxation or impingement of prosthesis. We dislocated the prosthesis, dropped the leg back into position, removed trial components, and irrigated copiously. The final stem and head was then placed, the leg brought back up, the system reduced and fluoroscopy used to verify positioning.  We irrigated, obtained hemostasis and closed the capsule using #2 ethibond suture.  One gram of vancomycin powder was placed in the surgical bed. The fascia was closed with #1 vicryl plus, the deep fat layer was closed with 0 vicryl, the subcutaneous layers closed with 2.0 Vicryl Plus and the skin closed with 2.0 nylon. A sterile dressing was applied. The patient was awakened in the operating room and taken to recovery in stable condition.  All sponge, needle, and instrument counts were  correct at the end of the case.   Position: supine  Complications: none.  Time Out: performed   Drains/Packing: none  Estimated blood loss: see anesthesia  record  Returned to Recovery Room: in good condition.   Antibiotics: yes   Mechanical VTE (DVT) Prophylaxis: sequential compression devices, TED thigh-high  Chemical VTE (DVT) Prophylaxis: lovenox   Fluid Replacement: see anesthesia record  Specimens Removed: 1 to pathology   Sponge and Instrument Count Correct? yes   PACU: portable radiograph - low AP   Plan/RTC: Return in 2 weeks for staple removal. Weight Bearing/Load Lower Extremity: full  Hip precautions: none Suture Removal: 2 weeks   N. Eduard Roux, MD [place] 3:19 PM     Implant Name Type Inv. Item Serial No. Manufacturer Lot No. LRB No. Used  CUP SECTOR GRIPTON 50MM - [id number] Cup CUP SECTOR GRIPTON 50MM  DEPUY SYNTHES [id number] Left 1  SCREW 6.5MMX25MM - [id number] Screw SCREW 6.5MMX25MM  DEPUY SYNTHES [id number] Left 1  PINNACLE PLUS 4 NEUTRAL - [id number] Hips PINNACLE PLUS 4 NEUTRAL  DEPUY SYNTHES [id number] Left 1  CABLE CERLAGE W/CRIMP 1.8MM - [id number] Cable CABLE CERLAGE W/CRIMP 1.8MM  ZIMMER RECON(ORTH,TRAU,BIO,SG) [id number] Left 1  STEM CORAIL KA13 - [id number] Stem STEM CORAIL KA13  DEPUY SYNTHES [id number] Left 1  HEAD FEMORAL 32 CERAMIC - [id number] Hips HEAD FEMORAL 32 CERAMIC  DEPUY SYNTHES [id number] Left 1  CABLE CERLAGE W/CRIMP 1.8MM - [id number] Cable CABLE CERLAGE W/CRIMP 1.8MM  ZIMMER [employer]ON(ORTH,TRAU,BIO,SG) [id number] Left 1

## 2018-09-27 NOTE — Plan of Care (Signed)

## 2018-09-27 NOTE — Progress Notes (Signed)
Patient transported to OR at this time.

## 2018-09-27 NOTE — Anesthesia Procedure Notes (Signed)
Procedure Name: Intubation Date/Time: 09/27/2018 1:42 PM Performed by: Shirlyn Goltz, CRNA Pre-anesthesia Checklist: Patient identified, Emergency Drugs available, Suction available and Patient being monitored Patient Re-evaluated:Patient Re-evaluated prior to induction Oxygen Delivery Method: Circle system utilized Preoxygenation: Pre-oxygenation with 100% oxygen Induction Type: IV induction Ventilation: Mask ventilation without difficulty Laryngoscope Size: Mac and 3 Grade View: Grade I Tube type: Oral Tube size: 7.0 mm Number of attempts: 1 Airway Equipment and Method: Stylet Placement Confirmation: positive ETCO2 and breath sounds checked- equal and bilateral Secured at: 21 cm Tube secured with: Tape Dental Injury: Teeth and Oropharynx as per pre-operative assessment

## 2018-09-27 NOTE — Transfer of Care (Signed)
Immediate Anesthesia Transfer of Care Note  Patient: Heidi Blevins  Procedure(s) Performed: LEFT TOTAL HIP ARTHROPLASTY ANTERIOR APPROACH (Left Hip)  Patient Location: PACU  Anesthesia Type:General  Level of Consciousness: awake  Airway & Oxygen Therapy: Patient Spontanous Breathing and Patient connected to face mask oxygen  Post-op Assessment: Report given to RN and Post -op Vital signs reviewed and stable HR 133 on arrival pacu.  Pt intermittanly snoring.  10mg  esmolol given.  HR down to 107  Post vital signs: Reviewed and stable  Last Vitals:  Vitals Value Taken Time  BP 144/84 09/27/2018  3:48 PM  Temp 36.7 C 09/27/2018  3:48 PM  Pulse 104 09/27/2018  3:52 PM  Resp 12 09/27/2018  3:52 PM  SpO2 95 % 09/27/2018  3:52 PM  Vitals shown include unvalidated device data.  Last Pain:  Vitals:   09/27/18 1548  TempSrc:   PainSc: Asleep         Complications: No apparent anesthesia complications

## 2018-09-27 NOTE — Progress Notes (Signed)
PROGRESS NOTE    Heidi Blevins  DDU:[id number] DOB: 08-11-1951 DOA: 09/26/2018 PCP: [name], MD    Brief Narrative:  68 year old female who presented with left leg pain.  She does have the significant past medical history of bifascicular block, schizophrenia, and hypertension who presents with persistent left leg pain.  Apparently she sustained a mechanical fall about 7 days ago, while she fell from her bed.  Her pain was worsening to the point where she was not able to ambulate.  On her initial physical examination blood pressure 152/73, heart rate 66, respiratory rate 16, temperature 98.6 F, oxygen saturation 99%.  She had a dry mucous membranes, lungs were positive for rhonchorous and sporadic wheezing, heart S1-S2 present and rhythmic, no gallops rubs or murmurs, abdomen soft nontender, no lower extremity edema.  Sodium 138, potassium 4.0, chloride 1 6, bicarb 24, glucose 125, BUN 15, creatinine 0.91, white count 18.3, hemoglobin 13.0, hematocrit 41.9, platelet 358, INR 1.1 urine analysis with greater than 50 white cells.  Left hip CT showed mildly impacted fracture through the base of the left femoral neck.  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  Left hip films with proximal left femur fracture. Chest  X-ray, positive scoliosis with no infiltrates.  EKG sinus rhythm with a left axis deviation and a right bundle branch block, chronic changes.  Patient was admitted to the hospital with a working diagnosis of acute left hip fracture.   Assessment & Plan:   Principal Problem:   Hip fracture ([place]) Active Problems:   Schizoaffective disorder ([place])   Bifascicular block   Essential hypertension   1. Left hip fracture. Patient is scheduled for surgery today, her preoperative cardiac risk stratification is class 1, low risk. She reports to be physically active before hip injury, able to climb steps and walk at a regular pace with no angina. Echocardiogram from 2009 with normal LV  systolic function. EKG with chronic left axis and right bundle brunch block (bifasicular block), no ischemic changes. Patient is medically stable for orthopedic procedure today.   2. Schizoaffective disorder. Continue lithium therapy, lorazepam and sertraline. No confusion or agitation.   3. HTN. Resume lisinopril for blood pressure control.    DVT prophylaxis: enoxaparin   Code Status: full Family Communication: no family at the bedside  Disposition Plan/ discharge barriers: pending surgical intervention.   Body mass index is 22.47 kg/m. Malnutrition Type:      Malnutrition Characteristics:      Nutrition Interventions:     RN Pressure Injury Documentation:     Consultants:   Orthopedics  Procedures:     Antimicrobials:       Subjective: Patient continue to have left hip pain, sharp in nature, worse with movement, improved with analgesics, no associated nausea or vomiting.   Objective: Vitals:   09/26/18 2009 09/26/18 2126 09/27/18 0320 09/27/18 0921  BP: (!) 153/87  (!) 145/77   Pulse: (!) 106  (!) 111 (!) 113  Resp: 20  18 16   Temp: 99.2 F (37.3 C)  99.3 F (37.4 C)   TempSrc: Oral  Oral   SpO2:  98% 95% 94%  Weight:      Height:        Intake/Output Summary (Last 24 hours) at 09/27/2018 1114 Last data filed at 09/27/2018 0320 Gross per 24 hour  Intake 700 ml  Output 1300 ml  Net -600 ml   Filed Weights   09/26/18 1358  Weight: 61.2 kg    Examination:  General: Not in pain or dyspnea, deconditioned  Neurology: Awake and alert, non focal  E ENT: mild pallor, no icterus, oral mucosa moist Cardiovascular: No JVD. S1-S2 present, rhythmic, no gallops, rubs, or murmurs. No lower extremity edema. Pulmonary: vesicular breath sounds bilaterally, adequate air movement, no wheezing, rhonchi or rales. Gastrointestinal. Abdomen with, no organomegaly, non tender, no rebound or guarding Skin. No rashes Musculoskeletal: no joint deformities/ left  lower extremity decreased range of motion due to pain.      Data Reviewed: I have personally reviewed following labs and imaging studies  CBC: Recent Labs  Lab 09/26/18 0910 09/27/18 0322  WBC 18.3* 16.9*  NEUTROABS 16.1*  --   HGB 13.0 11.7*  HCT 41.9 36.1  MCV 99.1 96.3  PLT 358 081   Basic Metabolic Panel: Recent Labs  Lab 09/26/18 0910 09/27/18 0322  NA 138 142  K 4.0 3.8  CL 106 112*  CO2 24 21*  GLUCOSE 125* 124*  BUN 15 14  CREATININE 0.91 1.00  CALCIUM 11.6* 10.7*   GFR: Estimated Creatinine Clearance: 49.1 mL/min (by C-G formula based on SCr of 1 mg/dL). Liver Function Tests: No results for input(s): AST, ALT, ALKPHOS, BILITOT, PROT, ALBUMIN in the last 168 hours. No results for input(s): LIPASE, AMYLASE in the last 168 hours. No results for input(s): AMMONIA in the last 168 hours. Coagulation Profile: Recent Labs  Lab 09/26/18 0910  INR 1.17   Cardiac Enzymes: No results for input(s): CKTOTAL, CKMB, CKMBINDEX, TROPONINI in the last 168 hours. BNP (last 3 results) No results for input(s): PROBNP in the last 8760 hours. HbA1C: No results for input(s): HGBA1C in the last 72 hours. CBG: No results for input(s): GLUCAP in the last 168 hours. Lipid Profile: No results for input(s): CHOL, HDL, LDLCALC, TRIG, CHOLHDL, LDLDIRECT in the last 72 hours. Thyroid Function Tests: No results for input(s): TSH, T4TOTAL, FREET4, T3FREE, THYROIDAB in the last 72 hours. Anemia Panel: No results for input(s): VITAMINB12, FOLATE, FERRITIN, TIBC, IRON, RETICCTPCT in the last 72 hours.    Radiology Studies: I have reviewed all of the imaging during this hospital visit personally     Scheduled Meds: . benztropine  1 mg Oral BID  . docusate sodium  100 mg Oral BID  . fluPHENAZine  20 mg Oral Daily  . lithium carbonate  150 mg Oral Q24H  . lithium carbonate  300 mg Oral Q24H  . LORazepam  0.5 mg Oral QID  . lurasidone  120 mg Oral Daily  . Melatonin  6 mg  Oral QHS  . nystatin  5 mL Oral QID  . polyvinyl alcohol  1 drop Both Eyes TID  . sertraline  100 mg Oral Daily   Continuous Infusions: . sodium chloride 75 mL/hr at 09/26/18 2221  . acetaminophen 1,000 mg (09/26/18 2224)  .  ceFAZolin (ANCEF) IV       LOS: 1 day        Tawni Millers, MD [facility] Pager [phone]

## 2018-09-27 NOTE — Discharge Instructions (Signed)

## 2018-09-28 LAB — BASIC METABOLIC PANEL
Anion gap: 7 (ref 5–15)
BUN: 18 mg/dL (ref 8–23)
CO2: 21 mmol/L — ABNORMAL LOW (ref 22–32)
Calcium: 10 mg/dL (ref 8.9–10.3)
Chloride: 112 mmol/L — ABNORMAL HIGH (ref 98–111)
Creatinine, Ser: 0.99 mg/dL (ref 0.44–1.00)
GFR calc Af Amer: >60 mL/min (ref >60)
GFR calc non Af Amer: 59 mL/min — ABNORMAL LOW (ref >60)
Glucose, Bld: 140 mg/dL — ABNORMAL HIGH (ref 70–99)
Potassium: 4.3 mmol/L (ref 3.5–5.1)
Sodium: 140 mmol/L (ref 135–145)

## 2018-09-28 LAB — CBC
HCT: 28.2 % — ABNORMAL LOW (ref 36.0–46.0)
Hemoglobin: 8.9 g/dL — ABNORMAL LOW (ref 12.0–15.0)
MCH: 31 pg (ref 26.0–34.0)
MCHC: 31.6 g/dL (ref 30.0–36.0)
MCV: 98.3 fL (ref 80.0–100.0)
PLATELETS: 247 10*3/uL (ref 150–400)
RBC: 2.87 MIL/uL — ABNORMAL LOW (ref 3.87–5.11)
RDW: 12.5 % (ref 11.5–15.5)
WBC: 13.9 10*3/uL — ABNORMAL HIGH (ref 4.0–10.5)
nRBC: 0 % (ref 0.0–0.2)

## 2018-09-28 LAB — URINE CULTURE: Culture: >=100000 — AB

## 2018-09-28 NOTE — Progress Notes (Addendum)
PROGRESS NOTE    Heidi Blevins  [id number]:[id number] DOB: 09-22-1950 DOA: 09/26/2018 PCP: [name], MD    Brief Narrative:  68 year old female who presented with left leg pain.  She does have the significant past medical history of bifascicular block, schizophrenia, and hypertension who presents with persistent left leg pain.  Apparently she sustained a mechanical fall about 7 days ago, while she fell from her bed.  Her pain was worsening to the point where she was not able to ambulate.  On her initial physical examination blood pressure 152/73, heart rate 66, respiratory rate 16, temperature 98.6 F, oxygen saturation 99%.  She had a dry mucous membranes, lungs were positive for rhonchorous and sporadic wheezing, heart S1-S2 present and rhythmic, no gallops rubs or murmurs, abdomen soft nontender, no lower extremity edema.  Sodium 138, potassium 4.0, chloride 1 6, bicarb 24, glucose 125, BUN 15, creatinine 0.91, white count 18.3, hemoglobin 13.0, hematocrit 41.9, platelet 358, INR 1.1 urine analysis with greater than 50 white cells.  Left hip CT showed mildly impacted fracture through the base of the left femoral neck.  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  Left hip films with proximal left femur fracture. Chest  X-ray, positive scoliosis with no infiltrates.  EKG sinus rhythm with a left axis deviation and a right bundle branch block, chronic changes.  Patient was admitted to the hospital with a working diagnosis of acute left hip fracture.   Assessment & Plan:   Principal Problem:   Closed displaced fracture of left femoral neck ([facility]) Active Problems:   Schizoaffective disorder ([facility])   Bifascicular block   Essential hypertension   1. Left hip fracture. Patient underwent procedure with no major complications, continue pain control with opiates and non steroidal agents, physical therapy evaluation, out of bed and dvt prophylaxis.   2. Schizoaffective disorder. On lithium,  lorazepam and sertraline, per her home regimen. No confusion or agitation.   3. HTN. Continue lisinopril for blood pressure control.    4. 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 Not affecting patient's hemodynamics or ability to participate in physical therapy. No further work up indicated at this point.   DVT prophylaxis: enoxaparin   Code Status: full Family Communication: no family at the bedside  Disposition Plan/ discharge barriers: pending surgical intervention.   Body mass index is 22.47 kg/m. Malnutrition Type:      Malnutrition Characteristics:      Nutrition Interventions:     RN Pressure Injury Documentation:     Consultants:   Orthopedics  Procedures:     Antimicrobials:       Subjective: Patient is out of bed to the chair, continue to complain of left hip pain, moderate in intensity, no radiation, worse with movement, no nausea or vomiting, no chest pain or dyspnea.   Objective: Vitals:   09/27/18 2103 09/28/18 0014 09/28/18 0353 09/28/18 0801  BP: 121/63 130/74 114/65 130/71  Pulse: (!) 105 95 99 (!) 105  Resp: 20 20 20    Temp: 99 F (37.2 C) 98.4 F (36.9 C) 99.4 F (37.4 C) 100.1 F (37.8 C)  TempSrc: Oral  Oral Oral  SpO2: 100% 99% 99% 94%  Weight:      Height:        Intake/Output Summary (Last 24 hours) at 09/28/2018 1054 Last data filed at 09/28/2018 0625 Gross per 24 hour  Intake 2885 ml  Output 1550 ml  Net 1335 ml   Filed Weights   09/26/18  1358  Weight: 61.2 kg    Examination:   General: deconditioned  Neurology: Awake and alert, non focal  E ENT: mild pallor, no icterus, oral mucosa moist Cardiovascular: No JVD. S1-S2 present, rhythmic, no gallops, rubs, or murmurs. No lower extremity edema. Pulmonary: vesicular breath sounds bilaterally, adequate air movement, no wheezing, rhonchi or rales. Gastrointestinal. Abdomen with no organomegaly, non tender, no rebound or guarding Skin. No  rashes Musculoskeletal: no joint deformities     Data Reviewed: I have personally reviewed following labs and imaging studies  CBC: Recent Labs  Lab 09/26/18 0910 09/27/18 0322 09/28/18 0521  WBC 18.3* 16.9* 13.9*  NEUTROABS 16.1*  --   --   HGB 13.0 11.7* 8.9*  HCT 41.9 36.1 28.2*  MCV 99.1 96.3 98.3  PLT 358 321 482   Basic Metabolic Panel: Recent Labs  Lab 09/26/18 0910 09/27/18 0322 09/28/18 0521  NA 138 142 140  K 4.0 3.8 4.3  CL 106 112* 112*  CO2 24 21* 21*  GLUCOSE 125* 124* 140*  BUN 15 14 18   CREATININE 0.91 1.00 0.99  CALCIUM 11.6* 10.7* 10.0   GFR: Estimated Creatinine Clearance: 49.6 mL/min (by C-G formula based on SCr of 0.99 mg/dL). Liver Function Tests: No results for input(s): AST, ALT, ALKPHOS, BILITOT, PROT, ALBUMIN in the last 168 hours. No results for input(s): LIPASE, AMYLASE in the last 168 hours. No results for input(s): AMMONIA in the last 168 hours. Coagulation Profile: Recent Labs  Lab 09/26/18 0910  INR 1.17   Cardiac Enzymes: No results for input(s): CKTOTAL, CKMB, CKMBINDEX, TROPONINI in the last 168 hours. BNP (last 3 results) No results for input(s): PROBNP in the last 8760 hours. HbA1C: No results for input(s): HGBA1C in the last 72 hours. CBG: No results for input(s): GLUCAP in the last 168 hours. Lipid Profile: No results for input(s): CHOL, HDL, LDLCALC, TRIG, CHOLHDL, LDLDIRECT in the last 72 hours. Thyroid Function Tests: No results for input(s): TSH, T4TOTAL, FREET4, T3FREE, THYROIDAB in the last 72 hours. Anemia Panel: No results for input(s): VITAMINB12, FOLATE, FERRITIN, TIBC, IRON, RETICCTPCT in the last 72 hours.    Radiology Studies: I have reviewed all of the imaging during this hospital visit personally     Scheduled Meds: . acetaminophen  500 mg Oral Q6H  . benztropine  1 mg Oral BID  . docusate sodium  100 mg Oral BID  . enoxaparin (LOVENOX) injection  40 mg Subcutaneous Q24H  . fluPHENAZine   20 mg Oral Daily  . ketorolac  15 mg Intravenous Q6H  . lisinopril  5 mg Oral Daily  . lithium carbonate  150 mg Oral Q24H  . lithium carbonate  300 mg Oral Q24H  . LORazepam  0.5 mg Oral QID  . lurasidone  120 mg Oral Daily  . Melatonin  6 mg Oral QHS  . nystatin  5 mL Oral QID  . polyvinyl alcohol  1 drop Both Eyes TID  . sertraline  100 mg Oral Daily   Continuous Infusions: . sodium chloride 75 mL/hr at 09/26/18 2221  . sodium chloride 75 mL/hr at 09/27/18 1756  . lactated ringers 10 mL/hr at 09/27/18 1255  . methocarbamol (ROBAXIN) IV       LOS: 2 days         Gerome Apley, MD

## 2018-09-28 NOTE — Plan of Care (Signed)
Problem: Education: Goal: Knowledge of General Education information will improve Description Including pain rating scale, medication(s)/side effects and non-pharmacologic comfort measures Outcome: Progressing   Problem: Health Behavior/Discharge Planning: Goal: Ability to manage health-related needs will improve Outcome: Progressing   Problem: Clinical Measurements: Goal: Respiratory complications will improve Outcome: Progressing Goal: Cardiovascular complication will be avoided Outcome: Progressing   Problem: Coping: Goal: Level of anxiety will decrease Outcome: Progressing   Problem: Safety: Goal: Ability to remain free from injury will improve Outcome: Progressing   Problem: Skin Integrity: Goal: Risk for impaired skin integrity will decrease Outcome: Progressing

## 2018-09-28 NOTE — Evaluation (Signed)
Occupational Therapy Evaluation Patient Details Name: Heidi Blevins MRN: [id number] DOB: 09/07/1950 Today's Date: 09/28/2018    History of Present Illness 68 year old female who presented with left leg pain.  She does have the significant past medical history of bifascicular block, schizophrenia, and hypertension who presents with persistent left leg pain.  Apparently she sustained a mechanical fall about 7 days ago, while she fell from her bed. CT showed L femoral neck fx and pt underwent L anterior THA.    Clinical Impression   Pt is a 68 yo female from ALF s/p fall and above dx. Pt PTA: performing ADL with assist from staff as needed and ambulating without AD. Pt currently limited by pain. Pt performing bed mobility with minA +2 and transfers with MinA ambulating a short distance. Pt modA for LB ADL and set-upA for UB ADL. Pt would benefit from continued OT skilled services for ADL, mobility and safety in SNF setting. OT to follow acutely to progress HEP and ADL ability.    Follow Up Recommendations  SNF;Supervision - Intermittent    Equipment Recommendations  3 in 1 bedside commode    Recommendations for Other Services       Precautions / Restrictions Precautions Precautions: Fall Restrictions Weight Bearing Restrictions: Yes LLE Weight Bearing: Weight bearing as tolerated      Mobility Bed Mobility Overal bed mobility: Needs Assistance Bed Mobility: Supine to Sit     Supine to sit: Mod assist;+2 for physical assistance     General bed mobility comments: pt able to initiate rolling to R but mod A needed for LE's off bed and elevation of trunk with scooting of hips to EOB  Transfers Overall transfer level: Needs assistance Equipment used: Rolling walker (2 wheeled) Transfers: Sit to/from Stand Sit to Stand: Min assist;+2 physical assistance         General transfer comment: min A to steady, vc's for hand placement but pt needed tactile cues for this as well     Balance Overall balance assessment: Needs assistance Sitting-balance support: Feet supported;Bilateral upper extremity supported Sitting balance-Leahy Scale: Fair     Standing balance support: Bilateral upper extremity supported Standing balance-Leahy Scale: Poor Standing balance comment: heavy reliance on UE support                           ADL either performed or assessed with clinical judgement   ADL Overall ADL's : Needs assistance/impaired Eating/Feeding: Set up   Grooming: Set up;Sitting   Upper Body Bathing: Set up;Sitting   Lower Body Bathing: Moderate assistance;Sitting/lateral leans;Sit to/from stand   Upper Body Dressing : Set up;Sitting   Lower Body Dressing: Moderate assistance;Sitting/lateral leans;Sit to/from stand   Toilet Transfer: Minimal assistance;BSC   Toileting- Clothing Manipulation and Hygiene: Moderate assistance;Sitting/lateral lean;Sit to/from stand       Functional mobility during ADLs: Minimal assistance;+2 for physical assistance;+2 for safety/equipment;Rolling walker General ADL Comments: Pt performing toilet hygiene with modA due to decreased strength and balance in LLE.     Vision Baseline Vision/History: No visual deficits       Perception     Praxis      Pertinent Vitals/Pain Pain Assessment: Faces Faces Pain Scale: Hurts even more Pain Location: L hip Pain Descriptors / Indicators: Operative site guarding;Grimacing;Guarding Pain Intervention(s): Limited activity within patient's tolerance;Monitored during session;Patient requesting pain meds-RN notified     Hand Dominance     Extremity/Trunk Assessment Upper Extremity Assessment Upper Extremity  Assessment: Generalized weakness   Lower Extremity Assessment Lower Extremity Assessment: Generalized weakness LLE Deficits / Details: LLE s/p L femoral neck fx LLE: Unable to fully assess due to pain LLE Sensation: WNL LLE Coordination: decreased gross motor    Cervical / Trunk Assessment Cervical / Trunk Assessment: Kyphotic   Communication Communication Communication: No difficulties   Cognition Arousal/Alertness: Awake/alert Behavior During Therapy: WFL for tasks assessed/performed Overall Cognitive Status: No family/caregiver present to determine baseline cognitive functioning                                 General Comments: pt follows simple commands, STM deficits apparent, mild anxiety   General Comments       Exercises Exercises: Total Joint Total Joint Exercises Ankle Circles/Pumps: AROM;Both;10 reps;Seated Long Arc Quad: AROM;Left;5 reps;Seated   Shoulder Instructions      Home Living Family/patient expects to be discharged to:: Assisted living                             Home Equipment: Gilford Rile - 2 wheels   Additional Comments: plans for SNF for rehab before back to ALF      Prior Functioning/Environment Level of Independence: Needs assistance  Gait / Transfers Assistance Needed: ambulated independently with RW ADL's / Homemaking Assistance Needed: reports that staff helped her with bathing and dressing   Comments: later pt reported that she did her own dressing, history questionable        OT Problem List: Decreased strength;Decreased activity tolerance;Impaired balance (sitting and/or standing);Decreased safety awareness;Pain;Increased edema      OT Treatment/Interventions: Self-care/ADL training;Therapeutic exercise;Neuromuscular education;Energy conservation;Therapeutic activities;Patient/family education;Balance training    OT Goals(Current goals can be found in the care plan section) Acute Rehab OT Goals Patient Stated Goal: get better OT Goal Formulation: With patient Time For Goal Achievement: 10/12/18 Potential to Achieve Goals: Good ADL Goals Pt Will Perform Lower Body Dressing: with min assist;sit to/from stand Pt Will Perform Toileting - Clothing Manipulation and  hygiene: with modified independence;sit to/from stand Additional ADL Goal #1: Pt will perform ADL functional mobility with supervisionA  OT Frequency: Min 2X/week   Barriers to D/C: Decreased caregiver support          Co-evaluation PT/OT/SLP Co-Evaluation/Treatment: Yes Reason for Co-Treatment: Complexity of the patient's impairments (multi-system involvement);To address functional/ADL transfers PT goals addressed during session: Mobility/safety with mobility;Balance;Proper use of DME OT goals addressed during session: ADL's and self-care      AM-PAC OT "6 Clicks" Daily Activity     Outcome Measure Help from another person eating meals?: None Help from another person taking care of personal grooming?: A Little Help from another person toileting, which includes using toliet, bedpan, or urinal?: A Lot Help from another person bathing (including washing, rinsing, drying)?: A Lot Help from another person to put on and taking off regular upper body clothing?: A Little Help from another person to put on and taking off regular lower body clothing?: A Lot 6 Click Score: 16   End of Session Equipment Utilized During Treatment: Gait belt;Rolling walker Nurse Communication: Mobility status  Activity Tolerance: Patient tolerated treatment well;Patient limited by pain Patient left: in chair;with call bell/phone within reach;with chair alarm set  OT Visit Diagnosis: Unsteadiness on feet (R26.81);Muscle weakness (generalized) (M62.81)                Time: 8299-3716 OT  Time Calculation (min): 34 min Charges:  OT General Charges $OT Visit: 1 Visit OT Evaluation $OT Eval Moderate Complexity: 1 Mod  Darryl Nestle) Marsa Aris OTR/L Acute Rehabilitation Services Pager: [phone] Office: [phone]   Fredda Hammed 09/28/2018, 2:49 PM

## 2018-09-28 NOTE — Evaluation (Signed)
Physical Therapy Evaluation Patient Details Name: Heidi Blevins MRN: [id number] DOB: 14-Aug-1951 Today's Date: 09/28/2018   History of Present Illness  68 year old female who presented with left leg pain.  She does have the significant past medical history of bifascicular block, schizophrenia, and hypertension who presents with persistent left leg pain.  Apparently she sustained a mechanical fall about 7 days ago, while she fell from her bed. CT showed L femoral neck fx and pt underwent L anterior THA.   Clinical Impression  Pt admitted with above diagnosis. Pt currently with functional limitations due to the deficits listed below (see PT Problem List). Pt required mod A +2 for bed mobility, min A +2 for transfer and short distance ambulation. Pt fatigued very quickly. Recommend SNF level care for rehab before returning to ALF.  Pt will benefit from skilled PT to increase their independence and safety with mobility to allow discharge to the venue listed below.      Follow Up Recommendations SNF;Supervision/Assistance - 24 hour    Equipment Recommendations  None recommended by PT    Recommendations for Other Services       Precautions / Restrictions Precautions Precautions: Fall Restrictions Weight Bearing Restrictions: Yes LLE Weight Bearing: Weight bearing as tolerated      Mobility  Bed Mobility Overal bed mobility: Needs Assistance Bed Mobility: Supine to Sit     Supine to sit: Mod assist;+2 for physical assistance     General bed mobility comments: pt able to initiate rolling to R but mod A needed for LE's off bed and elevation of trunk with scooting of hips to EOB  Transfers Overall transfer level: Needs assistance Equipment used: Rolling walker (2 wheeled) Transfers: Sit to/from Stand Sit to Stand: Min assist;+2 physical assistance         General transfer comment: min A to steady, vc's for hand placement but pt needed tactile cues for this as  well  Ambulation/Gait Ambulation/Gait assistance: Min assist;+2 safety/equipment Gait Distance (Feet): 3 Feet Assistive device: Rolling walker (2 wheeled) Gait Pattern/deviations: Step-to pattern;Trunk flexed;Decreased weight shift to left;Antalgic Gait velocity: decreased Gait velocity interpretation: <1.31 ft/sec, indicative of household ambulator General Gait Details: vc's for each step as well as sliding RW fwd  Stairs            Wheelchair Mobility    Modified Rankin (Stroke Patients Only)       Balance Overall balance assessment: Needs assistance Sitting-balance support: Feet supported;Bilateral upper extremity supported Sitting balance-Leahy Scale: Fair     Standing balance support: Bilateral upper extremity supported Standing balance-Leahy Scale: Poor Standing balance comment: heavy reliance on UE support                             Pertinent Vitals/Pain Pain Assessment: Faces Faces Pain Scale: Hurts even more Pain Location: L hip Pain Descriptors / Indicators: Operative site guarding;Grimacing;Guarding Pain Intervention(s): Limited activity within patient's tolerance;Monitored during session;Patient requesting pain meds-RN notified    Home Living Family/patient expects to be discharged to:: Assisted living               Home Equipment: Gilford Rile - 2 wheels Additional Comments: plans for SNF for rehab before back to ALF    Prior Function Level of Independence: Needs assistance   Gait / Transfers Assistance Needed: ambulated independently with RW  ADL's / Homemaking Assistance Needed: reports that staff helped her with bathing and dressing  Comments: later pt reported  that she did her own dressing, history questionable     Hand Dominance        Extremity/Trunk Assessment   Upper Extremity Assessment Upper Extremity Assessment: Defer to OT evaluation    Lower Extremity Assessment Lower Extremity Assessment: LLE  deficits/detail LLE Deficits / Details: hip flex 2-/5, knee ext 3-/5 LLE: Unable to fully assess due to pain LLE Sensation: WNL LLE Coordination: decreased gross motor    Cervical / Trunk Assessment Cervical / Trunk Assessment: Kyphotic  Communication   Communication: No difficulties  Cognition Arousal/Alertness: Awake/alert Behavior During Therapy: WFL for tasks assessed/performed Overall Cognitive Status: No family/caregiver present to determine baseline cognitive functioning                                 General Comments: pt follows simple commands, STM deficits apparent, mild anxiety      General Comments      Exercises Total Joint Exercises Ankle Circles/Pumps: AROM;Both;10 reps;Seated Long Arc Quad: AROM;Left;5 reps;Seated   Assessment/Plan    PT Assessment Patient needs continued PT services  PT Problem List Decreased strength;Decreased activity tolerance;Decreased balance;Decreased mobility;Decreased coordination;Decreased cognition;Decreased knowledge of use of DME;Decreased safety awareness;Decreased knowledge of precautions;Pain       PT Treatment Interventions DME instruction;Gait training;Functional mobility training;Therapeutic activities;Therapeutic exercise;Balance training;Neuromuscular re-education;Cognitive remediation;Patient/family education    PT Goals (Current goals can be found in the Care Plan section)  Acute Rehab PT Goals Patient Stated Goal: get better PT Goal Formulation: With patient Time For Goal Achievement: 10/12/18 Potential to Achieve Goals: Good    Frequency Min 3X/week   Barriers to discharge Decreased caregiver support ALF    Co-evaluation PT/OT/SLP Co-Evaluation/Treatment: Yes Reason for Co-Treatment: Necessary to address cognition/behavior during functional activity;For patient/therapist safety PT goals addressed during session: Mobility/safety with mobility;Balance;Proper use of DME         AM-PAC PT "6  Clicks" Mobility  Outcome Measure Help needed turning from your back to your side while in a flat bed without using bedrails?: A Lot Help needed moving from lying on your back to sitting on the side of a flat bed without using bedrails?: A Lot Help needed moving to and from a bed to a chair (including a wheelchair)?: A Lot Help needed standing up from a chair using your arms (e.g., wheelchair or bedside chair)?: A Little Help needed to walk in hospital room?: A Lot Help needed climbing 3-5 steps with a railing? : Total 6 Click Score: 12    End of Session Equipment Utilized During Treatment: Gait belt Activity Tolerance: Patient tolerated treatment well Patient left: in chair;with chair alarm set;with call bell/phone within reach Nurse Communication: Mobility status PT Visit Diagnosis: Unsteadiness on feet (R26.81);Other abnormalities of gait and mobility (R26.89);Pain Pain - Right/Left: Left Pain - part of body: Hip    Time: 0910-0933 PT Time Calculation (min) (ACUTE ONLY): 23 min   Charges:   PT Evaluation $PT Eval Moderate Complexity: [place]  Pager [phone] Office [place] 09/28/2018, 12:58 PM

## 2018-09-29 ENCOUNTER — Encounter (HOSPITAL_COMMUNITY): Payer: Self-pay | Admitting: *Deleted

## 2018-09-29 LAB — BASIC METABOLIC PANEL
Anion gap: 10 (ref 5–15)
BUN: 12 mg/dL (ref 8–23)
CO2: 21 mmol/L — ABNORMAL LOW (ref 22–32)
Calcium: 9.7 mg/dL (ref 8.9–10.3)
Chloride: 107 mmol/L (ref 98–111)
Creatinine, Ser: 0.83 mg/dL (ref 0.44–1.00)
GFR calc Af Amer: >60 mL/min (ref >60)
Glucose, Bld: 119 mg/dL — ABNORMAL HIGH (ref 70–99)
POTASSIUM: 4.1 mmol/L (ref 3.5–5.1)
Sodium: 138 mmol/L (ref 135–145)

## 2018-09-29 LAB — CBC
HCT: 27.8 % — ABNORMAL LOW (ref 36.0–46.0)
Hemoglobin: 8.6 g/dL — ABNORMAL LOW (ref 12.0–15.0)
MCH: 30 pg (ref 26.0–34.0)
MCHC: 30.9 g/dL (ref 30.0–36.0)
MCV: 96.9 fL (ref 80.0–100.0)
PLATELETS: 252 10*3/uL (ref 150–400)
RBC: 2.87 MIL/uL — ABNORMAL LOW (ref 3.87–5.11)
RDW: 12.2 % (ref 11.5–15.5)
WBC: 13.6 10*3/uL — ABNORMAL HIGH (ref 4.0–10.5)
nRBC: 0 % (ref 0.0–0.2)

## 2018-09-29 MED ORDER — PANTOPRAZOLE SODIUM 40 MG PO TBEC
40.0000 mg | DELAYED_RELEASE_TABLET | Freq: Every day | ORAL | Status: DC
  Administered 2018-09-29 – 2018-10-03 (×5): 40 mg via ORAL
  Filled 2018-09-29 (×5): qty 1

## 2018-09-29 MED ORDER — IBUPROFEN 200 MG PO TABS
400.0000 mg | ORAL_TABLET | Freq: Three times a day (TID) | ORAL | Status: DC
  Administered 2018-09-29 – 2018-09-30 (×3): 400 mg via ORAL
  Filled 2018-09-29 (×3): qty 2

## 2018-09-29 NOTE — Plan of Care (Signed)
Problem: Education: Goal: Knowledge of General Education information will improve Description Including pain rating scale, medication(s)/side effects and non-pharmacologic comfort measures Outcome: Progressing   Problem: Health Behavior/Discharge Planning: Goal: Ability to manage health-related needs will improve Outcome: Progressing   Problem: Clinical Measurements: Goal: Respiratory complications will improve Outcome: Progressing   Problem: Activity: Goal: Risk for activity intolerance will decrease Outcome: Progressing   Problem: Nutrition: Goal: Adequate nutrition will be maintained Outcome: Progressing   Problem: Coping: Goal: Level of anxiety will decrease Outcome: Progressing   Problem: Pain Managment: Goal: General experience of comfort will improve Outcome: Progressing   Problem: Safety: Goal: Ability to remain free from injury will improve Outcome: Progressing   Problem: Skin Integrity: Goal: Risk for impaired skin integrity will decrease Outcome: Progressing

## 2018-09-29 NOTE — Anesthesia Postprocedure Evaluation (Signed)
Anesthesia Post Note  Patient: Heidi Blevins  Procedure(s) Performed: LEFT TOTAL HIP ARTHROPLASTY ANTERIOR APPROACH (Left Hip)     Patient location during evaluation: PACU Anesthesia Type: General Level of consciousness: awake and alert Pain management: pain level controlled Vital Signs Assessment: post-procedure vital signs reviewed and stable Respiratory status: spontaneous breathing, nonlabored ventilation, respiratory function stable and patient connected to nasal cannula oxygen Cardiovascular status: blood pressure returned to baseline and stable Postop Assessment: no apparent nausea or vomiting Anesthetic complications: no    Last Vitals:  Vitals:   09/29/18 1502 09/29/18 1503  BP: 115/66 115/66  Pulse: (!) 108 (!) 108  Resp:  16  Temp:  37.4 C  SpO2: 94% 94%    Last Pain:  Vitals:   09/29/18 1644  TempSrc:   PainSc: Asleep                 Elonna Mcfarlane

## 2018-09-29 NOTE — Progress Notes (Signed)
PROGRESS NOTE    Heidi Blevins  RWE:[id number] DOB: May 13, 1951 DOA: 09/26/2018 PCP: [name], MD    Brief Narrative:  68 year old female who presented with left leg pain. She does have the significant past medicalhistory of bifascicular block, schizophrenia, andhypertension who presents with persistent left leg pain. Apparently she sustained a mechanical fall about 7 days ago, while she fell from her bed. Her pain was worsening to the point where she was not able to ambulate.On her initial physical examination blood pressure 152/73, heart rate 66, respiratory rate16, temperature 98.6 F, oxygen saturation 99%. She had a dry mucousmembranes, lungs were positive for rhonchorous and sporadic wheezing, heart S1-S2 present and rhythmic, no gallops rubs or murmurs, abdomen soft nontender, no lower extremity edema.Sodium 138, potassium 4.0, chloride 1 6, bicarb 24, glucose 125, BUN 15, creatinine 0.91, white count 18.3, hemoglobin 13.0, hematocrit 41.9, platelet 358, INR 1.1 urineanalysis with greater than 50 white cells.Left hip CT showed mildly impacted fracture through the base of the left femoral neck. 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 Left hip films with proximal left femur fracture. ChestX-ray, positive scoliosis with no infiltrates. EKG sinus rhythm with a left axis deviation and a right bundle branch block, chronic changes.  Patient was admitted to the hospital with a working diagnosis of acute left hip fracture.   Assessment & Plan:   Principal Problem:   Closed displaced fracture of left femoral neck (HCC) Active Problems:   Schizoaffective disorder (HCC)   Bifascicular block   Essential hypertension  1. Left hip fracture sp total hip replacement. Continue pain control with acetaminophen, hydrocodone, and ibuprofen. Will continue dvt prophylaxis, and physical therapy. Will add gi prophylaxis with pantoprazole.   2. Schizoaffective disorder.  Continue with lithium, lorazepam and sertraline.  3. HTN. On lisinopril, systolic blood pressure 14 mmHg.   4. 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 Chronic and stable.   DVT prophylaxis:enoxaparin Code Status:full Family Communication:no family at the bedside Disposition Plan/ discharge barriers:pending SNF placement, social services consulted, patient is medically stable for transfer.   Body mass index is 22.47 kg/m. Malnutrition Type:      Malnutrition Characteristics:      Nutrition Interventions:     RN Pressure Injury Documentation:     Consultants:   Orthopedics   Procedures:     Antimicrobials:       Subjective: Patient is out of bed to the chair, complains of left hip pain, no nausea or vomiting, no chest pain or dyspnea.   Objective: Vitals:   09/28/18 1540 09/28/18 1930 09/29/18 0349 09/29/18 0921  BP: (!) 109/57 (!) 100/56 (!) 107/56 124/74  Pulse: (!) 107 (!) 110 (!) 103 (!) 109  Resp: 18 16 14 16   Temp: 99.3 F (37.4 C) 99.8 F (37.7 C) 99.1 F (37.3 C) 99.4 F (37.4 C)  TempSrc: Oral Oral Oral Oral  SpO2: 96% 92% 96% 94%  Weight:      Height:        Intake/Output Summary (Last 24 hours) at 09/29/2018 0955 Last data filed at 09/28/2018 1800 Gross per 24 hour  Intake 540 ml  Output 315 ml  Net 225 ml   Filed Weights   09/26/18 1358  Weight: 61.2 kg    Examination:   General: Not in pain or dyspnea. Deconditioned  Neurology: Awake and alert, non focal  E ENT: mild pallor, no icterus, oral mucosa moist Cardiovascular: No JVD. S1-S2 present, rhythmic, no gallops, rubs, or murmurs. No  lower extremity edema. Pulmonary: vesicular breath sounds bilaterally, adequate air movement, no wheezing, rhonchi or rales. Gastrointestinal. Abdomen with, no organomegaly, non tender, no rebound or guarding Skin. No rashes Musculoskeletal: no joint deformities     Data Reviewed: I have personally reviewed  following labs and imaging studies  CBC: Recent Labs  Lab 09/26/18 0910 09/27/18 0322 09/28/18 0521 09/29/18 0321  WBC 18.3* 16.9* 13.9* 13.6*  NEUTROABS 16.1*  --   --   --   HGB 13.0 11.7* 8.9* 8.6*  HCT 41.9 36.1 28.2* 27.8*  MCV 99.1 96.3 98.3 96.9  PLT 358 321 247 938   Basic Metabolic Panel: Recent Labs  Lab 09/26/18 0910 09/27/18 0322 09/28/18 0521 09/29/18 0321  NA 138 142 140 138  K 4.0 3.8 4.3 4.1  CL 106 112* 112* 107  CO2 24 21* 21* 21*  GLUCOSE 125* 124* 140* 119*  BUN 15 14 18 12   CREATININE 0.91 1.00 0.99 0.83  CALCIUM 11.6* 10.7* 10.0 9.7   GFR: Estimated Creatinine Clearance: 59.2 mL/min (by C-G formula based on SCr of 0.83 mg/dL). Liver Function Tests: No results for input(s): AST, ALT, ALKPHOS, BILITOT, PROT, ALBUMIN in the last 168 hours. No results for input(s): LIPASE, AMYLASE in the last 168 hours. No results for input(s): AMMONIA in the last 168 hours. Coagulation Profile: Recent Labs  Lab 09/26/18 0910  INR 1.17   Cardiac Enzymes: No results for input(s): CKTOTAL, CKMB, CKMBINDEX, TROPONINI in the last 168 hours. BNP (last 3 results) No results for input(s): PROBNP in the last 8760 hours. HbA1C: No results for input(s): HGBA1C in the last 72 hours. CBG: No results for input(s): GLUCAP in the last 168 hours. Lipid Profile: No results for input(s): CHOL, HDL, LDLCALC, TRIG, CHOLHDL, LDLDIRECT in the last 72 hours. Thyroid Function Tests: No results for input(s): TSH, T4TOTAL, FREET4, T3FREE, THYROIDAB in the last 72 hours. Anemia Panel: No results for input(s): VITAMINB12, FOLATE, FERRITIN, TIBC, IRON, RETICCTPCT in the last 72 hours.    Radiology Studies: I have reviewed all of the imaging during this hospital visit personally     Scheduled Meds: . benztropine  1 mg Oral BID  . docusate sodium  100 mg Oral BID  . enoxaparin (LOVENOX) injection  40 mg Subcutaneous Q24H  . fluPHENAZine  20 mg Oral Daily  . lisinopril  5 mg  Oral Daily  . lithium carbonate  150 mg Oral Q24H  . lithium carbonate  300 mg Oral Q24H  . LORazepam  0.5 mg Oral QID  . lurasidone  120 mg Oral Daily  . Melatonin  6 mg Oral QHS  . nystatin  5 mL Oral QID  . polyvinyl alcohol  1 drop Both Eyes TID  . sertraline  100 mg Oral Daily   Continuous Infusions: . lactated ringers 10 mL/hr at 09/27/18 1255  . methocarbamol (ROBAXIN) IV       LOS: 3 days        Britiany Silbernagel Gerome Apley, MD

## 2018-09-29 NOTE — Care Management (Signed)
Spoke with son, [name], regarding d/c plan.  Pt lives at [facility], which is an ALF with 24 -hour supervision.  They are considering moving to another ALF.  Pt and son desire for patient to go to SNF from hospital. [name] states she went to one from here previously on [address] and would like to return there if possible.  He will be hre today and available by phone tomorrow if messages left.

## 2018-09-30 ENCOUNTER — Encounter (HOSPITAL_COMMUNITY): Payer: Self-pay | Admitting: Orthopaedic Surgery

## 2018-09-30 LAB — CBC
HEMATOCRIT: 26.9 % — AB (ref 36.0–46.0)
Hemoglobin: 8.5 g/dL — ABNORMAL LOW (ref 12.0–15.0)
MCH: 30.8 pg (ref 26.0–34.0)
MCHC: 31.6 g/dL (ref 30.0–36.0)
MCV: 97.5 fL (ref 80.0–100.0)
Platelets: 289 10*3/uL (ref 150–400)
RBC: 2.76 MIL/uL — ABNORMAL LOW (ref 3.87–5.11)
RDW: 12.5 % (ref 11.5–15.5)
WBC: 14.8 10*3/uL — AB (ref 4.0–10.5)
nRBC: 0 % (ref 0.0–0.2)

## 2018-09-30 LAB — BPAM RBC
Blood Product Expiration Date: 202002232359
Blood Product Expiration Date: 202002232359
Unit Type and Rh: 5100
Unit Type and Rh: 5100

## 2018-09-30 LAB — TYPE AND SCREEN
ABO/RH(D): O POS
Antibody Screen: POSITIVE
Donor AG Type: NEGATIVE
Donor AG Type: NEGATIVE
Unit division: 0
Unit division: 0

## 2018-09-30 MED ORDER — IBUPROFEN 200 MG PO TABS: 400.0000 mg | ORAL_TABLET | Freq: Four times a day (QID) | ORAL | Status: DC | PRN

## 2018-09-30 NOTE — Progress Notes (Signed)
PROGRESS NOTE    Heidi Blevins  ASN:[id number] DOB: 10-27-1950 DOA: 09/26/2018 PCP: [name], MD    Brief Narrative:  68 year old female who presented with left leg pain. She does have the significant past medicalhistory of bifascicular block, schizophrenia, andhypertension who presents with persistent left leg pain. Apparently she sustained a mechanical fall about 7 days ago, while she fell from her bed. Her pain was worsening to the point where she was not able to ambulate.On her initial physical examination blood pressure 152/73, heart rate 66, respiratory rate16, temperature 98.6 F, oxygen saturation 99%. She had a dry mucousmembranes, lungs were positive for rhonchorous and sporadic wheezing, heart S1-S2 present and rhythmic, no gallops rubs or murmurs, abdomen soft nontender, no lower extremity edema.Sodium 138, potassium 4.0, chloride 1 6, bicarb 24, glucose 125, BUN 15, creatinine 0.91, white count 18.3, hemoglobin 13.0, hematocrit 41.9, platelet 358, INR 1.1 urineanalysis with greater than 50 white cells.Left hip CT showed mildly impacted fracture through the base of the left femoral neck. 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 Left hip films with proximal left femur fracture. ChestX-ray, positive scoliosis with no infiltrates. EKG sinus rhythm with a left axis deviation and a right bundle branch block, chronic changes.  Patient was admitted to the hospital with a working diagnosis of acute left hip fracture.   Assessment & Plan:   Principal Problem:   Closed displaced fracture of left femoral neck (HCC) Active Problems:   Schizoaffective disorder (HCC)   Bifascicular block   Essential hypertension  1. Left hip fracture sp total hip replacement. Patient is medically stable to be transfer to snf, will continue pain control and dvt prophylaxis, out of bed to the chair and ambulating with physical therapy.  2. Schizoaffective disorder.Onlithium,  lorazepam and sertraline, with good toleration.   3. HTN.Continue with  lisinopril for blood pressure control, systolic 193 mmHg.   4. 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    DVT prophylaxis:enoxaparin Code Status:full Family Communication:no family at the bedside Disposition Plan/ discharge barriers:pending SNF placement, social services consulted, patient is medically stable for transfer.    Body mass index is 22.47 kg/m. Malnutrition Type:      Malnutrition Characteristics:      Nutrition Interventions:     RN Pressure Injury Documentation:     Consultants:   Orthopedics   Procedures:     Antimicrobials:       Subjective: Patient is out of bed to the chair, continue to have left hip pain, improved with analgesics, no nausea or vomiting, ambulated with physical therapy.   Objective: Vitals:   09/29/18 2051 09/29/18 2244 09/30/18 0645 09/30/18 1054  BP: 103/89  (!) 105/55 106/65  Pulse: (!) 102  96 98  Resp:      Temp: 100 F (37.8 C) 98.1 F (36.7 C) 98.4 F (36.9 C)   TempSrc: Oral  Oral   SpO2: 100%  97%   Weight:      Height:        Intake/Output Summary (Last 24 hours) at 09/30/2018 1221 Last data filed at 09/29/2018 1551 Gross per 24 hour  Intake 480 ml  Output 300 ml  Net 180 ml   Filed Weights   09/26/18 1358  Weight: 61.2 kg    Examination:   General: deconditioned  Neurology: Awake and alert, non focal  E ENT: mild pallor, no icterus, oral mucosa moist Cardiovascular: No JVD. S1-S2 present, rhythmic, no gallops, rubs, or murmurs. No lower extremity  edema. Pulmonary: positive breath sounds bilaterally, adequate air movement, no wheezing, rhonchi or rales. Gastrointestinal. Abdomen with no organomegaly, non tender, no rebound or guarding Skin. No rashes Musculoskeletal: no joint deformities     Data Reviewed: I have personally reviewed following labs and imaging studies  CBC: Recent  Labs  Lab 09/26/18 0910 09/27/18 0322 09/28/18 0521 09/29/18 0321 09/30/18 0355  WBC 18.3* 16.9* 13.9* 13.6* 14.8*  NEUTROABS 16.1*  --   --   --   --   HGB 13.0 11.7* 8.9* 8.6* 8.5*  HCT 41.9 36.1 28.2* 27.8* 26.9*  MCV 99.1 96.3 98.3 96.9 97.5  PLT 358 321 247 252 532   Basic Metabolic Panel: Recent Labs  Lab 09/26/18 0910 09/27/18 0322 09/28/18 0521 09/29/18 0321  NA 138 142 140 138  K 4.0 3.8 4.3 4.1  CL 106 112* 112* 107  CO2 24 21* 21* 21*  GLUCOSE 125* 124* 140* 119*  BUN 15 14 18 12   CREATININE 0.91 1.00 0.99 0.83  CALCIUM 11.6* 10.7* 10.0 9.7   GFR: Estimated Creatinine Clearance: 59.2 mL/min (by C-G formula based on SCr of 0.83 mg/dL). Liver Function Tests: No results for input(s): AST, ALT, ALKPHOS, BILITOT, PROT, ALBUMIN in the last 168 hours. No results for input(s): LIPASE, AMYLASE in the last 168 hours. No results for input(s): AMMONIA in the last 168 hours. Coagulation Profile: Recent Labs  Lab 09/26/18 0910  INR 1.17   Cardiac Enzymes: No results for input(s): CKTOTAL, CKMB, CKMBINDEX, TROPONINI in the last 168 hours. BNP (last 3 results) No results for input(s): PROBNP in the last 8760 hours. HbA1C: No results for input(s): HGBA1C in the last 72 hours. CBG: No results for input(s): GLUCAP in the last 168 hours. Lipid Profile: No results for input(s): CHOL, HDL, LDLCALC, TRIG, CHOLHDL, LDLDIRECT in the last 72 hours. Thyroid Function Tests: No results for input(s): TSH, T4TOTAL, FREET4, T3FREE, THYROIDAB in the last 72 hours. Anemia Panel: No results for input(s): VITAMINB12, FOLATE, FERRITIN, TIBC, IRON, RETICCTPCT in the last 72 hours.    Radiology Studies: I have reviewed all of the imaging during this hospital visit personally     Scheduled Meds: . benztropine  1 mg Oral BID  . docusate sodium  100 mg Oral BID  . enoxaparin (LOVENOX) injection  40 mg Subcutaneous Q24H  . fluPHENAZine  20 mg Oral Daily  . ibuprofen  400 mg Oral  TID  . lisinopril  5 mg Oral Daily  . lithium carbonate  150 mg Oral Q24H  . lithium carbonate  300 mg Oral Q24H  . LORazepam  0.5 mg Oral QID  . lurasidone  120 mg Oral Daily  . Melatonin  6 mg Oral QHS  . nystatin  5 mL Oral QID  . pantoprazole  40 mg Oral Daily  . polyvinyl alcohol  1 drop Both Eyes TID  . sertraline  100 mg Oral Daily   Continuous Infusions: . lactated ringers 10 mL/hr at 09/27/18 1255  . methocarbamol (ROBAXIN) IV       LOS: 4 days        Sedra Morfin Gerome Apley, MD

## 2018-09-30 NOTE — Plan of Care (Signed)
  Problem: Education: Goal: Knowledge of General Education information will improve Description: Including pain rating scale, medication(s)/side effects and non-pharmacologic comfort measures Outcome: Progressing   Problem: Health Behavior/Discharge Planning: Goal: Ability to manage health-related needs will improve Outcome: Progressing   Problem: Clinical Measurements: Goal: Respiratory complications will improve Outcome: Progressing Goal: Cardiovascular complication will be avoided Outcome: Progressing   Problem: Activity: Goal: Risk for activity intolerance will decrease Outcome: Progressing   Problem: Nutrition: Goal: Adequate nutrition will be maintained Outcome: Progressing   Problem: Coping: Goal: Level of anxiety will decrease Outcome: Progressing   Problem: Elimination: Goal: Will not experience complications related to urinary retention Outcome: Progressing   Problem: Pain Managment: Goal: General experience of comfort will improve Outcome: Progressing   Problem: Safety: Goal: Ability to remain free from injury will improve Outcome: Progressing   Problem: Skin Integrity: Goal: Risk for impaired skin integrity will decrease Outcome: Progressing   

## 2018-09-30 NOTE — Progress Notes (Signed)
Subjective: 3 Days Post-Op Procedure(s) (LRB): LEFT TOTAL HIP ARTHROPLASTY ANTERIOR APPROACH (Left) Patient reports pain as moderate.    Objective: Vital signs in last 24 hours: Temp:  [98.1 F (36.7 C)-100 F (37.8 C)] 98.4 F (36.9 C) (02/03 0645) Pulse Rate:  [96-109] 96 (02/03 0645) Resp:  [16] 16 (02/02 1503) BP: (103-124)/(55-89) 105/55 (02/03 0645) SpO2:  [94 %-100 %] 97 % (02/03 0645)  Intake/Output from previous day: 02/02 0701 - 02/03 0700 In: 720 [P.O.:720] Out: 300 [Urine:300] Intake/Output this shift: No intake/output data recorded.  Recent Labs    09/28/18 0521 09/29/18 0321 09/30/18 0355  HGB 8.9* 8.6* 8.5*   Recent Labs    09/29/18 0321 09/30/18 0355  WBC 13.6* 14.8*  RBC 2.87* 2.76*  HCT 27.8* 26.9*  PLT 252 289   Recent Labs    09/28/18 0521 09/29/18 0321  NA 140 138  K 4.3 4.1  CL 112* 107  CO2 21* 21*  BUN 18 12  CREATININE 0.99 0.83  GLUCOSE 140* 119*  CALCIUM 10.0 9.7   No results for input(s): LABPT, INR in the last 72 hours.  Neurologically intact Neurovascular intact Sensation intact distally Intact pulses distally Dorsiflexion/Plantar flexion intact Incision: dressing C/D/I No cellulitis present Compartment soft    Assessment/Plan: 3 Days Post-Op Procedure(s) (LRB): LEFT TOTAL HIP ARTHROPLASTY ANTERIOR APPROACH (Left) Up with therapy  WBAT LLE ABLA- will defer to medicine team F/u with Dr. Erlinda Hong 2 weeks post-op for suture removal    Aundra Dubin 09/30/2018, 7:26 AM

## 2018-09-30 NOTE — Progress Notes (Signed)
Physical Therapy Treatment Patient Details Name: Heidi Blevins MRN: [id number] DOB: 1950/12/08 Today's Date: 09/30/2018    History of Present Illness 68 year old female who presented with left leg pain.  She does have the significant past medical history of bifascicular block, schizophrenia, and hypertension who presents with persistent left leg pain.  Apparently she sustained a mechanical fall about 7 days ago, while she fell from her bed. CT showed L femoral neck fx and pt underwent L anterior THA.     PT Comments    Continuing work on functional mobility and activity tolerance; Able to incr amb distance today; Still with difficulty slowing down steps and following step-by-step cues; Minguard for amb (with this therapist pulling chair behind); Will consider working with a tech next session to push chair behind, so that frees up PT to give more multimodal and tactile cueing as well  Follow Up Recommendations  SNF;Supervision/Assistance - 24 hour     Equipment Recommendations  None recommended by PT    Recommendations for Other Services       Precautions / Restrictions Precautions Precautions: Fall Restrictions LLE Weight Bearing: Weight bearing as tolerated    Mobility  Bed Mobility Overal bed mobility: Needs Assistance Bed Mobility: Supine to Sit     Supine to sit: Min assist     General bed mobility comments: Min handheld assist to pull to sit  Transfers Overall transfer level: Needs assistance Equipment used: Rolling walker (2 wheeled) Transfers: Sit to/from Stand Sit to Stand: Min assist         General transfer comment: min A to steady, vc's for hand placement but pt needed tactile cues for this as well, especially to reach back for armrests to control descent  Ambulation/Gait Ambulation/Gait assistance: Min assist Gait Distance (Feet): 12 Feet Assistive device: Rolling walker (2 wheeled) Gait Pattern/deviations: Step-to pattern;Decreased step length -  right;Decreased step length - left;Decreased stance time - right;Antalgic;Trunk flexed     General Gait Details: Very short, staccato steps R and L; Difficulty smoothing out steps; Attempted short, step-by-step cueing, but Beyla takes quick steps, difficulty attending to verbal cues; anxious; 2 seated rest breaks; cues to relax shoulders in standing   Stairs             Wheelchair Mobility    Modified Rankin (Stroke Patients Only)       Balance     Sitting balance-Leahy Scale: Fair       Standing balance-Leahy Scale: Poor Standing balance comment: heavy reliance on UE support                            Cognition Arousal/Alertness: Awake/alert Behavior During Therapy: WFL for tasks assessed/performed Overall Cognitive Status: No family/caregiver present to determine baseline cognitive functioning                                 General Comments: pt follows simple commands, STM deficits apparent, mild anxiety      Exercises Total Joint Exercises Ankle Circles/Pumps: AROM;Both;10 reps;Seated Towel Squeeze: AROM;Both;10 reps Heel Slides: AAROM;Left;10 reps Hip ABduction/ADduction: AAROM;Left;10 reps    General Comments        Pertinent Vitals/Pain Pain Assessment: Faces Faces Pain Scale: Hurts even more Pain Location: L hip Pain Descriptors / Indicators: Operative site guarding;Grimacing;Guarding Pain Intervention(s): Monitored during session;RN gave pain meds during session    Home Living  Prior Function            PT Goals (current goals can now be found in the care plan section) Acute Rehab PT Goals Patient Stated Goal: get better PT Goal Formulation: With patient Time For Goal Achievement: 10/12/18 Potential to Achieve Goals: Good Progress towards PT goals: Progressing toward goals(Slowly)    Frequency    Min 3X/week      PT Plan Current plan remains appropriate     Co-evaluation              AM-PAC PT "6 Clicks" Mobility   Outcome Measure  Help needed turning from your back to your side while in a flat bed without using bedrails?: A Little Help needed moving from lying on your back to sitting on the side of a flat bed without using bedrails?: A Little Help needed moving to and from a bed to a chair (including a wheelchair)?: A Lot Help needed standing up from a chair using your arms (e.g., wheelchair or bedside chair)?: A Little Help needed to walk in hospital room?: A Little Help needed climbing 3-5 steps with a railing? : Total 6 Click Score: 15    End of Session Equipment Utilized During Treatment: Gait belt Activity Tolerance: Patient tolerated treatment well Patient left: in chair;with chair alarm set;with call bell/phone within reach Nurse Communication: Mobility status PT Visit Diagnosis: Unsteadiness on feet (R26.81);Other abnormalities of gait and mobility (R26.89);Pain Pain - Right/Left: Left Pain - part of body: Hip     Time: 0831-0902 PT Time Calculation (min) (ACUTE ONLY): 31 min  Charges:  $Gait Training: 8-22 mins $Therapeutic Exercise: 8-22 mins                     Roney Marion, PT  Acute Rehabilitation Services Pager [phone] Office [place] 09/30/2018, 2:06 PM

## 2018-09-30 NOTE — NC FL2 (Signed)
[place] LEVEL OF CARE SCREENING TOOL     IDENTIFICATION  Patient Name: Heidi Blevins Birthdate: 04/07/1951 Sex: female Admission Date (Current Location): 09/26/2018  [facility] and [place] Number:  Herbalist and Address:  [facility] [place],  [address] [place], [place] [address]      Provider Number: [id number]  Attending Physician Name and Address:  Tawni Millers  Relative Name and Phone Number:  Reece Agar (son) [phone]    Current Level of Care: Hospital Recommended Level of Care: [place]  Prior Approval Number:    Date Approved/Denied:   PASRR Number:    Discharge Plan: SNF    Current Diagnoses: Patient Active Problem List   Diagnosis Date Noted  . Closed displaced fracture of left femoral neck ([place]) 09/26/2018  . AKI (acute kidney injury) ([place]) 06/13/2016  . UTI (urinary tract infection) 06/13/2016  . Essential hypertension 06/13/2016  . Urinary tract infection without hematuria   . Complicated UTI (urinary tract infection) 05/03/2016  . Hypercalcemia 05/03/2016  . Dehydration 05/03/2016  . Acute encephalopathy 05/03/2016  . 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 Schizoaffective disorder ([place]) 05/03/2016  . Bifascicular block 05/03/2016    Orientation RESPIRATION BLADDER Height & Weight     Self, Time, Situation, Place  O2(nasal cannula 2L/min) Continent Weight: 61.2 kg Height:  5\' 5"  (165.1 cm)  BEHAVIORAL SYMPTOMS/MOOD NEUROLOGICAL BOWEL NUTRITION STATUS      Continent Diet(see discharge summary)  AMBULATORY STATUS COMMUNICATION OF NEEDS Skin   Extensive Assist Verbally Surgical wounds(left hip closed surgical incision)                       Personal Care Assistance Level of Assistance  Bathing, Feeding, Dressing, Total care Bathing Assistance: Maximum assistance Feeding assistance: Limited assistance Dressing Assistance: Maximum assistance Total Care Assistance:  Maximum assistance   Functional Limitations Info  Sight, Hearing, Speech Sight Info: Impaired Hearing Info: Adequate Speech Info: Adequate    SPECIAL CARE FACTORS FREQUENCY  PT (By licensed PT), OT (By licensed OT)     PT Frequency: min 5x weekly OT Frequency: min 5x weekly            Contractures Contractures Info: Not present    Additional Factors Info  Code Status, Allergies Code Status Info: full Allergies Info: sulfonamide derivatives           Current Medications (09/30/2018):  This is the current hospital active medication list Current Facility-Administered Medications  Medication Dose Route Frequency Provider Last Rate Last Dose  . acetaminophen (TYLENOL) tablet 325-650 mg  325-650 mg Oral Q6H PRN Leandrew Koyanagi, MD   325 mg at 09/29/18 0106  . albuterol (PROVENTIL) (2.5 MG/3ML) 0.083% nebulizer solution 2.5 mg  2.5 mg Nebulization Q4H PRN Leandrew Koyanagi, MD      . alum & mag hydroxide-simeth (MAALOX/MYLANTA) 200-200-20 MG/5ML suspension 30 mL  30 mL Oral Q4H PRN Leandrew Koyanagi, MD      . benztropine (COGENTIN) tablet 1 mg  1 mg Oral BID Leandrew Koyanagi, MD   1 mg at 09/29/18 2031  . bisacodyl (DULCOLAX) EC tablet 5 mg  5 mg Oral Daily PRN Leandrew Koyanagi, MD      . docusate sodium (COLACE) capsule 100 mg  100 mg Oral BID Leandrew Koyanagi, MD   100 mg at 09/29/18 2032  . enoxaparin (LOVENOX) injection 40 mg  40 mg Subcutaneous Q24H Leandrew Koyanagi, MD  40 mg at 09/29/18 1618  . fluPHENAZine (PROLIXIN) tablet 20 mg  20 mg Oral Daily Leandrew Koyanagi, MD   20 mg at 09/29/18 1325  . HYDROcodone-acetaminophen (NORCO) 7.5-325 MG per tablet 1-2 tablet  1-2 tablet Oral Q4H PRN Leandrew Koyanagi, MD   1 tablet at 09/29/18 [phone]  . HYDROcodone-acetaminophen (NORCO/VICODIN) 5-325 MG per tablet 1-2 tablet  1-2 tablet Oral Q4H PRN Leandrew Koyanagi, MD   1 tablet at 09/30/18 [phone]  . ibuprofen (ADVIL,MOTRIN) tablet 400 mg  400 mg Oral TID Tawni Millers, MD   400 mg at 09/29/18 2032  .  lactated ringers infusion   Intravenous Continuous Leandrew Koyanagi, MD 10 mL/hr at 09/27/18 1255    . lisinopril (PRINIVIL,ZESTRIL) tablet 5 mg  5 mg Oral Daily Leandrew Koyanagi, MD   5 mg at 09/29/18 9622  . lithium carbonate capsule 150 mg  150 mg Oral Q24H Leandrew Koyanagi, MD   150 mg at 09/29/18 2030  . lithium carbonate capsule 300 mg  300 mg Oral Q24H Leandrew Koyanagi, MD   300 mg at 09/30/18 0846  . LORazepam (ATIVAN) tablet 0.5 mg  0.5 mg Oral QID Leandrew Koyanagi, MD   0.5 mg at 09/29/18 2033  . LORazepam (ATIVAN) tablet 0.5 mg  0.5 mg Oral BID PRN Leandrew Koyanagi, MD   0.5 mg at 09/28/18 0557  . lurasidone (LATUDA) tablet 120 mg  120 mg Oral Daily Leandrew Koyanagi, MD   120 mg at 09/29/18 0925  . magnesium citrate solution 1 Bottle  1 Bottle Oral Once PRN Leandrew Koyanagi, MD      . Melatonin TABS 6 mg  6 mg Oral QHS Leandrew Koyanagi, MD   6 mg at 09/29/18 2033  . menthol-cetylpyridinium (CEPACOL) lozenge 3 mg  1 lozenge Oral PRN Leandrew Koyanagi, MD       Or  . phenol (CHLORASEPTIC) mouth spray 1 spray  1 spray Mouth/Throat PRN Leandrew Koyanagi, MD      . methocarbamol (ROBAXIN) tablet 500 mg  500 mg Oral Q6H PRN Leandrew Koyanagi, MD   500 mg at 09/30/18 0846   Or  . methocarbamol (ROBAXIN) 500 mg in dextrose 5 % 50 mL IVPB  500 mg Intravenous Q6H PRN Leandrew Koyanagi, MD      . morphine 2 MG/ML injection 1 mg  1 mg Intravenous Q2H PRN Leandrew Koyanagi, MD      . nystatin (MYCOSTATIN) 100000 UNIT/ML suspension 500,000 Units  5 mL Oral QID Leandrew Koyanagi, MD   500,000 Units at 09/29/18 2035  . ondansetron (ZOFRAN) tablet 4 mg  4 mg Oral Q6H PRN Leandrew Koyanagi, MD       Or  . ondansetron [facility]) injection 4 mg  4 mg Intravenous Q6H PRN Leandrew Koyanagi, MD      . pantoprazole (PROTONIX) EC tablet 40 mg  40 mg Oral Daily Arrien, Jimmy Picket, MD   40 mg at 09/29/18 1222  . polyethylene glycol (MIRALAX / GLYCOLAX) packet 17 g  17 g Oral Daily PRN Leandrew Koyanagi, MD      . polyvinyl alcohol (LIQUIFILM TEARS) 1.4 % ophthalmic  solution 1 drop  1 drop Both Eyes TID Leandrew Koyanagi, MD   1 drop at 09/29/18 2035  . sertraline (ZOLOFT) tablet 100 mg  100 mg Oral Daily Leandrew Koyanagi, MD   100 mg at 09/29/18 0926  .  sorbitol 70 % solution 30 mL  30 mL Oral Daily PRN Leandrew Koyanagi, MD         Discharge Medications: Please see discharge summary for a list of discharge medications.  Relevant Imaging Results:  Relevant Lab Results:   Additional Information SSN: [id number]  Alberteen Sam, LCSW

## 2018-10-01 NOTE — Plan of Care (Signed)

## 2018-10-01 NOTE — Progress Notes (Signed)
Physical Therapy Treatment Patient Details Name: DAREEN GUTZWILLER MRN: [id number] DOB: Oct 26, 1950 Today's Date: 10/01/2018    History of Present Illness 68 year old female who presented with left leg pain.  She does have the significant past medical history of bifascicular block, schizophrenia, and hypertension who presents with persistent left leg pain.  Apparently she sustained a mechanical fall about 7 days ago, while she fell from her bed. CT showed L femoral neck fx and pt underwent L anterior THA.     PT Comments    Continuing work on functional mobility and activity tolerance;  Ms. Chick had just gotten a pain pill, and it wasn't quite effective, but she was willing to work; better steps when able to focus and with lots of tactile cueing (therefore, second person helpful to push chair behind); Walked 2 bouts of 5 feet before she requested to end the session  Follow Up Recommendations  SNF;Supervision/Assistance - 24 hour     Equipment Recommendations  None recommended by PT    Recommendations for Other Services       Precautions / Restrictions Precautions Precautions: Fall Restrictions LLE Weight Bearing: Weight bearing as tolerated    Mobility  Bed Mobility Overal bed mobility: Needs Assistance Bed Mobility: Supine to Sit     Supine to sit: Min assist     General bed mobility comments: Sat up to long sit without assist; min assist to help LLE off of bed and to steady  Transfers Overall transfer level: Needs assistance Equipment used: Rolling walker (2 wheeled) Transfers: Sit to/from Stand Sit to Stand: Min guard         General transfer comment: Minguard for safety; cues for hand placement and control  Ambulation/Gait Ambulation/Gait assistance: Min assist;+2 safety/equipment Gait Distance (Feet): 5 Feet(x2) Assistive device: Rolling walker (2 wheeled) Gait Pattern/deviations: Decreased stance time - left;Wide base of support;Antalgic Gait velocity:  decreased   General Gait Details: Very short, staccato steps R and L; Difficulty smoothing out steps; Attempted short, step-by-step cueing, but Zawadi takes quick steps, difficulty attending to verbal cues; anxious; 2 seated rest breaks; cues to relax shoulders in standing; better step length with mutlimodal cues for weight shift fully into stance R and L, but especially L   Stairs             Wheelchair Mobility    Modified Rankin (Stroke Patients Only)       Balance     Sitting balance-Leahy Scale: Fair       Standing balance-Leahy Scale: Poor Standing balance comment: heavy reliance on UE support                            Cognition Arousal/Alertness: Awake/alert Behavior During Therapy: WFL for tasks assessed/performed Overall Cognitive Status: No family/caregiver present to determine baseline cognitive functioning                                 General Comments: pt follows simple commands, STM deficits apparent, mild anxiety      Exercises      General Comments        Pertinent Vitals/Pain Pain Assessment: Faces Faces Pain Scale: Hurts even more Pain Location: L hip Pain Descriptors / Indicators: Operative site guarding;Grimacing;Guarding Pain Intervention(s): Monitored during session;Premedicated before session    Home Living  Prior Function            PT Goals (current goals can now be found in the care plan section) Acute Rehab PT Goals Patient Stated Goal: Agreeable to try walking; at end of session she agreed to goal to walk farther (in teh hallway) next session PT Goal Formulation: With patient Time For Goal Achievement: 10/12/18 Potential to Achieve Goals: Good Progress towards PT goals: Progressing toward goals    Frequency    Min 3X/week      PT Plan Current plan remains appropriate    Co-evaluation              AM-PAC PT "6 Clicks" Mobility   Outcome  Measure  Help needed turning from your back to your side while in a flat bed without using bedrails?: A Little Help needed moving from lying on your back to sitting on the side of a flat bed without using bedrails?: A Little Help needed moving to and from a bed to a chair (including a wheelchair)?: A Little Help needed standing up from a chair using your arms (e.g., wheelchair or bedside chair)?: A Little Help needed to walk in hospital room?: A Little Help needed climbing 3-5 steps with a railing? : Total 6 Click Score: 16    End of Session Equipment Utilized During Treatment: Gait belt Activity Tolerance: Patient tolerated treatment well Patient left: in chair;with chair alarm set;with call bell/phone within reach Nurse Communication: Mobility status PT Visit Diagnosis: Unsteadiness on feet (R26.81);Other abnormalities of gait and mobility (R26.89);Pain Pain - Right/Left: Left Pain - part of body: Hip     Time: 1610-9604 PT Time Calculation (min) (ACUTE ONLY): 25 min  Charges:  $Gait Training: 8-22 mins $Therapeutic Activity: 8-22 mins                     Roney Marion, PT  Acute Rehabilitation Services Pager [phone] Office [place] 10/01/2018, 4:34 PM

## 2018-10-01 NOTE — Plan of Care (Signed)
  Problem: Education: Goal: Knowledge of General Education information will improve Description Including pain rating scale, medication(s)/side effects and non-pharmacologic comfort measures Outcome: Progressing   

## 2018-10-01 NOTE — Progress Notes (Signed)
PROGRESS NOTE    [name]  DTO:[id number] DOB: 1950/09/04 DOA: 09/26/2018 PCP: Heidi Poll, MD    Brief Narrative:  68 year old female who presented with left leg pain. She does have the significant past medicalhistory of bifascicular block, schizophrenia, andhypertension who presents with persistent left leg pain. Apparently she sustained a mechanical fall about 7 days ago, while she fell from her bed. Her pain was worsening to the point where she was not able to ambulate.On her initial physical examination blood pressure 152/73, heart rate 66, respiratory rate16, temperature 98.6 F, oxygen saturation 99%. She had a dry mucousmembranes, lungs were positive for rhonchorous and sporadic wheezing, heart S1-S2 present and rhythmic, no gallops rubs or murmurs, abdomen soft nontender, no lower extremity edema.Sodium 138, potassium 4.0, chloride 1 6, bicarb 24, glucose 125, BUN 15, creatinine 0.91, white count 18.3, hemoglobin 13.0, hematocrit 41.9, platelet 358, INR 1.1 urineanalysis with greater than 50 white cells.Left hip CT showed mildly impacted fracture through the base of the left femoral neck. 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 Left hip films with proximal left femur fracture. ChestX-ray, positive scoliosis with no infiltrates. EKG sinus rhythm with a left axis deviation and a right bundle branch block, chronic changes.  Patient was admitted to the hospital with a working diagnosis of acute left hip fracture.   Assessment & Plan:   Principal Problem:   Closed displaced fracture of left femoral neck (HCC) Active Problems:   Schizoaffective disorder (HCC)   Bifascicular block   Essential hypertension   1. Left hip fracturesp total hip replacement. Patient continue to be  medically stable to be transfer to SNF. Pain control and dvt prophylaxis. Pain reported to be improved today, continue with acetaminophen, hydrocodone, ibuprofen, and morphine.    2. Schizoaffective disorder.Continue withlithium, lorazepam and sertraline. No confusion or agitation.  3. HTN.On Lisinopril for blood pressure control.   4. 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   DVT prophylaxis:enoxaparin Code Status:full Family Communication:no family at the bedside Disposition Plan/ discharge barriers:pending SNF placement.   Body mass index is 22.47 kg/m. Malnutrition Type:      Malnutrition Characteristics:      Nutrition Interventions:     RN Pressure Injury Documentation:     Consultants:   Orthopedics  Procedures:   Left hip arthroplasty.   Antimicrobials:       Subjective: Patient reports improvement in her pain control, no nausea or vomiting, no chest pain or dyspnea. Waiting to be transfer to snf.   Objective: Vitals:   09/30/18 1306 09/30/18 2110 10/01/18 0800 10/01/18 1159  BP: (!) 115/58 114/65 126/64 120/62  Pulse: 91 (!) 102 95 100  Resp: 19 (!) 23  17  Temp: 98 F (36.7 C) 99 F (37.2 C)    TempSrc:  Oral    SpO2: 100% 95%  99%  Weight:      Height:        Intake/Output Summary (Last 24 hours) at 10/01/2018 1324 Last data filed at 10/01/2018 0850 Gross per 24 hour  Intake 720 ml  Output 350 ml  Net 370 ml   Filed Weights   09/26/18 1358  Weight: 61.2 kg    Examination:   General: Not in pain or dyspnea, deconditioned  Neurology: Awake and alert, non focal  E ENT: mild pallor, no icterus, oral mucosa moist Cardiovascular: No JVD. S1-S2 present, rhythmic, no gallops, rubs, or murmurs. No lower extremity edema. Pulmonary: vesicular breath sounds bilaterally, adequate air movement,  no wheezing, rhonchi or rales. Gastrointestinal. Abdomen with no organomegaly, non tender, no rebound or guarding Skin. No rashes Musculoskeletal: no joint deformities     Data Reviewed: I have personally reviewed following labs and imaging studies  CBC: Recent Labs  Lab  09/26/18 0910 09/27/18 0322 09/28/18 0521 09/29/18 0321 09/30/18 0355  WBC 18.3* 16.9* 13.9* 13.6* 14.8*  NEUTROABS 16.1*  --   --   --   --   HGB 13.0 11.7* 8.9* 8.6* 8.5*  HCT 41.9 36.1 28.2* 27.8* 26.9*  MCV 99.1 96.3 98.3 96.9 97.5  PLT 358 321 247 252 277   Basic Metabolic Panel: Recent Labs  Lab 09/26/18 0910 09/27/18 0322 09/28/18 0521 09/29/18 0321  NA 138 142 140 138  K 4.0 3.8 4.3 4.1  CL 106 112* 112* 107  CO2 24 21* 21* 21*  GLUCOSE 125* 124* 140* 119*  BUN 15 14 18 12   CREATININE 0.91 1.00 0.99 0.83  CALCIUM 11.6* 10.7* 10.0 9.7   GFR: Estimated Creatinine Clearance: 59.2 mL/min (by C-G formula based on SCr of 0.83 mg/dL). Liver Function Tests: No results for input(s): AST, ALT, ALKPHOS, BILITOT, PROT, ALBUMIN in the last 168 hours. No results for input(s): LIPASE, AMYLASE in the last 168 hours. No results for input(s): AMMONIA in the last 168 hours. Coagulation Profile: Recent Labs  Lab 09/26/18 0910  INR 1.17   Cardiac Enzymes: No results for input(s): CKTOTAL, CKMB, CKMBINDEX, TROPONINI in the last 168 hours. BNP (last 3 results) No results for input(s): PROBNP in the last 8760 hours. HbA1C: No results for input(s): HGBA1C in the last 72 hours. CBG: No results for input(s): GLUCAP in the last 168 hours. Lipid Profile: No results for input(s): CHOL, HDL, LDLCALC, TRIG, CHOLHDL, LDLDIRECT in the last 72 hours. Thyroid Function Tests: No results for input(s): TSH, T4TOTAL, FREET4, T3FREE, THYROIDAB in the last 72 hours. Anemia Panel: No results for input(s): VITAMINB12, FOLATE, FERRITIN, TIBC, IRON, RETICCTPCT in the last 72 hours.    Radiology Studies: I have reviewed all of the imaging during this hospital visit personally     Scheduled Meds: . benztropine  1 mg Oral BID  . docusate sodium  100 mg Oral BID  . enoxaparin (LOVENOX) injection  40 mg Subcutaneous Q24H  . fluPHENAZine  20 mg Oral Daily  . lisinopril  5 mg Oral Daily  .  lithium carbonate  150 mg Oral Q24H  . lithium carbonate  300 mg Oral Q24H  . LORazepam  0.5 mg Oral QID  . lurasidone  120 mg Oral Daily  . Melatonin  6 mg Oral QHS  . nystatin  5 mL Oral QID  . pantoprazole  40 mg Oral Daily  . polyvinyl alcohol  1 drop Both Eyes TID  . sertraline  100 mg Oral Daily   Continuous Infusions: . lactated ringers 10 mL/hr at 09/27/18 1255  . methocarbamol (ROBAXIN) IV       LOS: 5 days        Mauricio Gerome Apley, MD

## 2018-10-01 NOTE — Clinical Social Work Note (Signed)
Clinical Social Work Assessment  Patient Details  Name: Heidi Blevins MRN: [id number] Date of Birth: 11-06-1950  Date of referral:  10/01/18               Reason for consult:  Discharge Planning                Permission sought to share information with:  Case Manager, Facility Sport and exercise psychologist, Family Supports Permission granted to share information::  Yes, Verbal Permission Granted  Name::     Geneticist, molecular::  SNFs  Relationship::  son  Contact Information:  [phone]  Housing/Transportation Living arrangements for the past 2 months:  [place] of Information:  Patient Patient Interpreter Needed:  None Criminal Activity/Legal Involvement Pertinent to Current Situation/Hospitalization:  No - Comment as needed Significant Relationships:  Adult Children Lives with:  Self Do you feel safe going back to the place where you live?  No Need for family participation in patient care:  Yes (Comment)  Care giving concerns:  CSW received referral for possible SNF placement at time of discharge. Spoke with patient regarding possibility of SNF placement . Patient's  family  is currently unable to care for her at their home given patient's current needs and fall risk.  Patient and  Sons expressed understanding of PT recommendation and are agreeable to SNF placement at time of discharge. CSW to continue to follow and assist with discharge planning needs.     Social Worker assessment / plan:  Spoke with patient and son Heidi Blevins    concerning possibility of rehab at [facility] before returning to [facility].   Employment status:  Retired Forensic scientist:  Medicare PT Recommendations:  [place] / Referral to community resources:  [place]  Patient/Family's Response to care:  Patient and  sons   recognize need for rehab before returning home and are agreeable to a SNF in [place]. They report preference for    [facility] healthcare as they report patient has been there in the past which they enjoyed    . CSW explained insurance authorization process. Patient's family reported that they want patient to get stronger to be able to come back home.    Patient/Family's Understanding of and Emotional Response to Diagnosis, Current Treatment, and Prognosis:  Patient/family is realistic regarding therapy needs and expressed being hopeful for SNF placement. Patient expressed understanding of CSW role and discharge process as well as medical condition. No questions/concerns about plan or treatment.   Emotional Assessment Appearance:  Appears stated age Attitude/Demeanor/Rapport:  Gracious Affect (typically observed):  Accepting Orientation:  Oriented to Self, Oriented to Place, Oriented to  Time, Oriented to Situation Alcohol / Substance use:  Not Applicable Psych involvement (Current and /or in the community):  No (Comment)  Discharge Needs  Concerns to be addressed:  Discharge Planning Concerns Readmission within the last 30 days:  No Current discharge risk:  Dependent with Mobility Barriers to Discharge:  Continued Medical Work up   [employer], [place] 10/01/2018, 10:25 AM

## 2018-10-01 NOTE — Care Management Important Message (Signed)
Important Message  Patient Details  Name: Heidi Blevins MRN: [id number] Date of Birth: 16-Oct-1950   Medicare Important Message Given:  Yes    Annelle Behrendt 10/01/2018, 8:24 AM

## 2018-10-01 NOTE — Progress Notes (Signed)
CSW followed up with [facility] to receive patient's PASRR number. They report it is still under review due to it being Level 2 from patient's diagnosis of schizophrenia. They report it can take 2-3 business days for PASRR to review and give CSW her number to go to SNF facility.   CSW will continue to follow up on pending PASRR number.   [address], [place]

## 2018-10-02 LAB — CBC
HCT: 25.5 % — ABNORMAL LOW (ref 36.0–46.0)
HEMOGLOBIN: 7.9 g/dL — AB (ref 12.0–15.0)
MCH: 29.9 pg (ref 26.0–34.0)
MCHC: 31 g/dL (ref 30.0–36.0)
MCV: 96.6 fL (ref 80.0–100.0)
Platelets: 401 10*3/uL — ABNORMAL HIGH (ref 150–400)
RBC: 2.64 MIL/uL — AB (ref 3.87–5.11)
RDW: 12.2 % (ref 11.5–15.5)
WBC: 10.8 10*3/uL — ABNORMAL HIGH (ref 4.0–10.5)
nRBC: 0.2 % (ref 0.0–0.2)

## 2018-10-02 NOTE — Progress Notes (Signed)
[facility] PROGRESS NOTE  MEKENZIE MODESTE XNA:[id number] DOB: 08-May-1951 DOA: 09/26/2018 PCP: [name], MD  Assessment/Plan: 1. 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  We will continue Lovenox and DC per orthopedics for prophylaxis 2. Acute on chronic anemia.  Previous baseline hemoglobin 11/13.  Has remained stable 8.9 here, currently 7.9.  Will monitor CBC closely, remains asymptomatic with no localizing signs or symptoms of bleeding. 3. Leukocytosis, resolved.  Likely stress related to recent fall/fracture.  Now resolved. 4. Schizoaffective disorder, stable continue home lithium, lorazepam and sertraline, fluphenazine.  Currently well compensated. 5. Hypertension, stable continue home lisinopril 6. 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  Did not need any cardiac preoperative clearance given stability.  Code Status: Full code Family Communication: No family at bedside Disposition Plan: Medically stable for discharge,currently awaiting PASRR per social work to allow for discharge to SNF   Consultants:  Orthopedics  Procedures:  Left total hip replacement, left hip, 09/27/2018  Antibiotics:  None  HPI/Subjective:  BREHANNA DEVENY is a 68 y.o. year old female with medical history significant for bifascicular block, schizophrenia, andhypertension  who presented on 09/26/2018 with left leg pain after mechanical fall from her bed a week prior and was found to have left femoral neck fracture.  No acute complaints this a.m.  Does report some pain but seems to be well-controlled on pain regimen.  No acute events overnight.  Objective: Vitals:   10/02/18 0859 10/02/18 1610  BP: (!) 104/59 (!) 107/57  Pulse: 95 (!) 102  Resp:    Temp: 98.5 F (36.9 C) 99.1 F (37.3 C)  SpO2:  96% 96%    Intake/Output Summary (Last 24 hours) at 10/02/2018 1639 Last data filed at 10/02/2018 1451 Gross per 24 hour  Intake 480 ml  Output 250 ml  Net 230 ml   Filed Weights   09/26/18 1358  Weight: 61.2 kg    Exam:   General: Lying in bed, no acute distress  Cardiovascular: Regular rate and rhythm, no appreciable murmurs rubs or gallops, no peripheral edema  Respiratory: Normal respiratory effort on room air  Abdomen: Soft, nondistended, nontender  Musculoskeletal: Left hip surgical dressing in place  Skin no rashes or skin lesions  Neurologic alert and oriented to person place time and context  Data Reviewed: Basic Metabolic Panel: Recent Labs  Lab 09/26/18 0910 09/27/18 0322 09/28/18 0521 09/29/18 0321  NA 138 142 140 138  K 4.0 3.8 4.3 4.1  CL 106 112* 112* 107  CO2 24 21* 21* 21*  GLUCOSE 125* 124* 140* 119*  BUN 15 14 18 12   CREATININE 0.91 1.00 0.99 0.83  CALCIUM 11.6* 10.7* 10.0 9.7   Liver Function Tests: No results for input(s): AST, ALT, ALKPHOS, BILITOT, PROT, ALBUMIN in the last 168 hours. No results for input(s): LIPASE, AMYLASE in the last 168 hours. No results for input(s): AMMONIA in the last 168 hours. CBC: Recent Labs  Lab 09/26/18 0910 09/27/18 0322 09/28/18 0521 09/29/18 0321 09/30/18 0355 10/02/18 0932  WBC 18.3* 16.9* 13.9* 13.6* 14.8* 10.8*  NEUTROABS 16.1*  --   --   --   --   --   HGB 13.0 11.7* 8.9* 8.6* 8.5* 7.9*  HCT 41.9 36.1 28.2* 27.8* 26.9* 25.5*  MCV 99.1 96.3 98.3 96.9 97.5 96.6  PLT 358 321  247 252 289 401*   Cardiac Enzymes: No results for input(s): CKTOTAL, CKMB, CKMBINDEX, TROPONINI in the last 168 hours. BNP (last 3 results) No results for input(s): BNP in the last 8760 hours.  ProBNP (last 3 results) No results for input(s): PROBNP in the last 8760 hours.  CBG: No results for input(s): GLUCAP in the last 168 hours.  Recent Results (from the past 240 hour(s))  Urine Culture     Status: Abnormal    Collection Time: 09/26/18  3:02 PM  Result Value Ref Range Status   Specimen Description   Final    URINE, RANDOM Performed at [facility] [address] [place], [place] [address]    Special Requests   Final    NONE Performed at [facility], [place] [address], [place], [place] [address]    Culture >=100,000 COLONIES/mL ESCHERICHIA COLI (A)  Final   Report Status 09/28/2018 FINAL  Final   Organism ID, Bacteria ESCHERICHIA COLI (A)  Final      Susceptibility   Escherichia coli - MIC*    AMPICILLIN >=32 RESISTANT Resistant     CEFAZOLIN <=4 SENSITIVE Sensitive     CEFTRIAXONE <=1 SENSITIVE Sensitive     CIPROFLOXACIN >=4 RESISTANT Resistant     GENTAMICIN <=1 SENSITIVE Sensitive     IMIPENEM <=0.25 SENSITIVE Sensitive     NITROFURANTOIN <=16 SENSITIVE Sensitive     TRIMETH/SULFA <=20 SENSITIVE Sensitive     AMPICILLIN/SULBACTAM 16 INTERMEDIATE Intermediate     PIP/TAZO <=4 SENSITIVE Sensitive     Extended ESBL NEGATIVE Sensitive     * >=100,000 COLONIES/mL ESCHERICHIA COLI  Surgical pcr screen     Status: None   Collection Time: 09/26/18  7:22 PM  Result Value Ref Range Status   MRSA, PCR NEGATIVE NEGATIVE Final   Staphylococcus aureus NEGATIVE NEGATIVE Final    Comment: (NOTE) The Xpert SA Assay (FDA approved for NASAL specimens in patients 71 years of age and older), is one component of a comprehensive surveillance program. It is not intended to diagnose infection nor to guide or monitor treatment. Performed at [facility], [place] [address], [place], [place] [address]      Studies: No results found.  Scheduled Meds: . benztropine  1 mg Oral BID  . docusate sodium  100 mg Oral BID  . enoxaparin (LOVENOX) injection  40 mg Subcutaneous Q24H  . fluPHENAZine  20 mg Oral Daily  . lisinopril  5 mg Oral Daily  . lithium carbonate  150 mg Oral Q24H  . lithium carbonate  300 mg Oral Q24H  . LORazepam  0.5 mg Oral QID  .  lurasidone  120 mg Oral Daily  . Melatonin  6 mg Oral QHS  . nystatin  5 mL Oral QID  . pantoprazole  40 mg Oral Daily  . polyvinyl alcohol  1 drop Both Eyes TID  . sertraline  100 mg Oral Daily   Continuous Infusions: . lactated ringers 10 mL/hr at 09/27/18 1255  . methocarbamol (ROBAXIN) IV      Principal Problem:   Closed displaced fracture of left femoral neck (HCC) Active Problems:   Schizoaffective disorder (HCC)   Bifascicular block   Essential hypertension      Desiree Hane  [facility]

## 2018-10-02 NOTE — Progress Notes (Signed)
Physical Therapy Treatment Patient Details Name: Heidi Blevins MRN: [id number] DOB: 04-22-1951 Today's Date: 10/02/2018    History of Present Illness 68 year old female who presented with left leg pain.  She does have the significant past medical history of bifascicular block, schizophrenia, and hypertension who presents with persistent left leg pain.  Apparently she sustained a mechanical fall about 7 days ago, while she fell from her bed. CT showed L femoral neck fx and pt underwent L anterior THA.     PT Comments    Continuing work on functional mobility and activity tolerance;  Very sleepy this afternoon -- arousable, but sleepy; Attempted a round of therex for her hip with equivocal success, as she kept drifting to near sleep (maybe due to pain meds?); Able to transfer OOB to recliner with min/mod assist; Took sitting and standing BPs -- no significant SBP drop; Discussed pt status with Marita [place], RN   Follow Up Recommendations  SNF;Supervision/Assistance - 24 hour     Equipment Recommendations  None recommended by PT    Recommendations for Other Services       Precautions / Restrictions Precautions Precautions: Fall Restrictions LLE Weight Bearing: Weight bearing as tolerated    Mobility  Bed Mobility Overal bed mobility: Needs Assistance Bed Mobility: Supine to Sit     Supine to sit: Min assist     General bed mobility comments: Sat up to long sit without assist; min assist to help LLE off of bed and to steady  Transfers Overall transfer level: Needs assistance Equipment used: Rolling walker (2 wheeled) Transfers: Sit to/from Stand Sit to Stand: Mod assist;Min guard         General transfer comment: Heavy mod assist to stand from bed; numerous cues to push up from bed, but she still moved hands to RW, and needed significantly more assist to stand from bed; stood from reclienr with minguard assist and good use of armrests to push  up  Ambulation/Gait Ambulation/Gait assistance: Min assist;+2 safety/equipment Gait Distance (Feet): (pivotal steps bed to recliner) Assistive device: Rolling walker (2 wheeled) Gait Pattern/deviations: Decreased stance time - left;Wide base of support;Antalgic     General Gait Details: So sleepy today, with more difficulty stepping; opted for just pivot steps to chair   Stairs             Wheelchair Mobility    Modified Rankin (Stroke Patients Only)       Balance                                            Cognition Arousal/Alertness: (Arousable, but really sleepy compared to yesterday) Behavior During Therapy: WFL for tasks assessed/performed Overall Cognitive Status: No family/caregiver present to determine baseline cognitive functioning                                 General Comments: pt follows simple commands, STM deficits apparent, mild anxiety      Exercises Total Joint Exercises Quad Sets: AROM;Left;10 reps Gluteal Sets: Other (comment)(Attempted; but Ms. Grose was sleepy and tended to drift off) Towel Squeeze: AROM;Both;5 reps(with long hold of muscle contraction) Heel Slides: AAROM;Left;10 reps Hip ABduction/ADduction: AAROM;Left;10 reps    General Comments        Pertinent Vitals/Pain Pain Assessment: Faces Faces Pain Scale: Hurts little more Pain  Location: L hip Pain Descriptors / Indicators: Operative site guarding;Grimacing;Guarding Pain Intervention(s): Monitored during session;Premedicated before session    Home Living                      Prior Function            PT Goals (current goals can now be found in the care plan section) Acute Rehab PT Goals Patient Stated Goal: Wants OOB to recliner this afternoon PT Goal Formulation: With patient Time For Goal Achievement: 10/12/18 Potential to Achieve Goals: Good Progress towards PT goals: Not progressing toward goals - comment(very sleepy  today)    Frequency    Min 3X/week      PT Plan Current plan remains appropriate    Co-evaluation              AM-PAC PT "6 Clicks" Mobility   Outcome Measure  Help needed turning from your back to your side while in a flat bed without using bedrails?: A Little Help needed moving from lying on your back to sitting on the side of a flat bed without using bedrails?: A Little Help needed moving to and from a bed to a chair (including a wheelchair)?: A Little Help needed standing up from a chair using your arms (e.g., wheelchair or bedside chair)?: A Little Help needed to walk in hospital room?: A Lot Help needed climbing 3-5 steps with a railing? : Total 6 Click Score: 15    End of Session Equipment Utilized During Treatment: Gait belt Activity Tolerance: Patient tolerated treatment well;Other (comment)(thoguh really sleepy) Patient left: in chair;with chair alarm set;with call bell/phone within reach Nurse Communication: Mobility status PT Visit Diagnosis: Unsteadiness on feet (R26.81);Other abnormalities of gait and mobility (R26.89);Pain Pain - Right/Left: Left Pain - part of body: Hip     Time: 1224-8250 PT Time Calculation (min) (ACUTE ONLY): 42 min  Charges:  $Therapeutic Exercise: 8-22 mins $Therapeutic Activity: 8-22 mins                     Roney Marion, PT  Acute Rehabilitation Services Pager [phone] Office [place] 10/02/2018, 4:50 PM

## 2018-10-02 NOTE — Plan of Care (Signed)
Problem: Education: Goal: Knowledge of General Education information will improve Description Including pain rating scale, medication(s)/side effects and non-pharmacologic comfort measures Outcome: Progressing   Problem: Health Behavior/Discharge Planning: Goal: Ability to manage health-related needs will improve Outcome: Progressing   Problem: Activity: Goal: Risk for activity intolerance will decrease Outcome: Progressing   Problem: Nutrition: Goal: Adequate nutrition will be maintained Outcome: Progressing   Problem: Coping: Goal: Level of anxiety will decrease Outcome: Progressing   Problem: Elimination: Goal: Will not experience complications related to urinary retention Outcome: Progressing   Problem: Pain Managment: Goal: General experience of comfort will improve Outcome: Progressing   Problem: Safety: Goal: Ability to remain free from injury will improve Outcome: Progressing   Problem: Skin Integrity: Goal: Risk for impaired skin integrity will decrease Outcome: Progressing   Problem: Education: Goal: Verbalization of understanding the information provided (i.e., activity precautions, restrictions, etc) will improve Outcome: Progressing   Problem: Activity: Goal: Ability to ambulate and perform ADLs will improve Outcome: Progressing   Problem: Clinical Measurements: Goal: Postoperative complications will be avoided or minimized Outcome: Progressing   Problem: Self-Concept: Goal: Ability to maintain and perform role responsibilities to the fullest extent possible will improve Outcome: Progressing

## 2018-10-03 DIAGNOSIS — D72829 Elevated white blood cell count, unspecified: Secondary | ICD-10-CM

## 2018-10-03 DIAGNOSIS — Z419 Encounter for procedure for purposes other than remedying health state, unspecified: Secondary | ICD-10-CM

## 2018-10-03 LAB — CBC WITH DIFFERENTIAL/PLATELET
Abs Immature Granulocytes: 0.18 10*3/uL — ABNORMAL HIGH (ref 0.00–0.07)
Basophils Absolute: 0.1 10*3/uL (ref 0.0–0.1)
Basophils Relative: 1 %
EOS PCT: 4 %
Eosinophils Absolute: 0.6 10*3/uL — ABNORMAL HIGH (ref 0.0–0.5)
HCT: 26.1 % — ABNORMAL LOW (ref 36.0–46.0)
Hemoglobin: 7.9 g/dL — ABNORMAL LOW (ref 12.0–15.0)
Immature Granulocytes: 1 %
Lymphocytes Relative: 25 %
Lymphs Abs: 3.2 10*3/uL (ref 0.7–4.0)
MCH: 30 pg (ref 26.0–34.0)
MCHC: 30.3 g/dL (ref 30.0–36.0)
MCV: 99.2 fL (ref 80.0–100.0)
Monocytes Absolute: 1.3 10*3/uL — ABNORMAL HIGH (ref 0.1–1.0)
Monocytes Relative: 10 %
Neutro Abs: 7.3 10*3/uL (ref 1.7–7.7)
Neutrophils Relative %: 59 %
Platelets: 442 10*3/uL — ABNORMAL HIGH (ref 150–400)
RBC: 2.63 MIL/uL — ABNORMAL LOW (ref 3.87–5.11)
RDW: 12.3 % (ref 11.5–15.5)
WBC: 12.6 10*3/uL — ABNORMAL HIGH (ref 4.0–10.5)
nRBC: 0.2 % (ref 0.0–0.2)

## 2018-10-03 LAB — CBC
HCT: 25.9 % — ABNORMAL LOW (ref 36.0–46.0)
Hemoglobin: 8 g/dL — ABNORMAL LOW (ref 12.0–15.0)
MCH: 30.2 pg (ref 26.0–34.0)
MCHC: 30.9 g/dL (ref 30.0–36.0)
MCV: 97.7 fL (ref 80.0–100.0)
Platelets: 442 10*3/uL — ABNORMAL HIGH (ref 150–400)
RBC: 2.65 MIL/uL — ABNORMAL LOW (ref 3.87–5.11)
RDW: 12.2 % (ref 11.5–15.5)
WBC: 12.4 10*3/uL — ABNORMAL HIGH (ref 4.0–10.5)
nRBC: 0 % (ref 0.0–0.2)

## 2018-10-03 MED ORDER — LORAZEPAM 0.5 MG PO TABS: 0.5000 mg | ORAL_TABLET | Freq: Four times a day (QID) | ORAL | 0 refills | Status: DC

## 2018-10-03 MED ORDER — POLYETHYLENE GLYCOL 3350 17 G PO PACK: 17.0000 g | PACK | Freq: Every day | ORAL | 0 refills | Status: DC

## 2018-10-03 MED ORDER — METHOCARBAMOL 500 MG PO TABS: 500.0000 mg | ORAL_TABLET | Freq: Four times a day (QID) | ORAL | 0 refills | Status: DC | PRN

## 2018-10-03 MED ORDER — LORAZEPAM 1 MG PO TABS: 0.5000 mg | ORAL_TABLET | Freq: Two times a day (BID) | ORAL | 0 refills | Status: DC | PRN

## 2018-10-03 NOTE — Discharge Summary (Signed)
Discharge Summary  [name] OIZ:[id number] DOB: [birth year]  PCP: [name], MD  Admit date: 09/26/2018 Discharge date: 10/03/2018   Time spent: < 25 minutes  Admitted From: home Disposition:  SNF  Recommendations for Outpatient Follow-up:  1. Follow up with PCP in 1 week 2. Script provided for pain regimen by orthopedic service,  3. New medications: robaxin PRN muscle spasms, miralax 4. Follow up appt: 2 weeks with Dr. Erlinda Hong, for suture removal/wound check 5.     Discharge Diagnoses:  Active Hospital Problems   Diagnosis Date Noted  . Closed displaced fracture of left femoral neck ([place]) 09/26/2018  . Essential hypertension 06/13/2016  . Schizoaffective disorder ([place]) 05/03/2016  . Bifascicular block 05/03/2016    Resolved Hospital Problems  No resolved problems to display.    Discharge Condition: Stable   CODE STATUS:Full   History of present illness:  Heidi Blevins is a 68 y.o. year old female with medical history significant for bifascicular block, schizophrenia, andhypertension  who presented on 09/26/2018 with left leg pain after mechanical fall from her bed a week prior and was found to have left femoral neck fracture. Remaining hospital course addressed in problem based format below:   Hospital Course:   1. Proximal Left femoral neck fracture. Found on Xr and confirmed on CT hip. Underwent Left total hip replacement on 3/38/2505 with no complications. Repeat XR imaging of hip and pelvis showed stable left hip prosthesis. PT evaluation recommended SNF for further rehabilitation, PRN Norco and Robaxin, has outpatient follow-up arranged with orthopedics in 2 weeks.  Will continue Lovenox on discharge and has follow up with Dr. Erlinda Hong in 2 weeks for post op/suture removal. Orthopedics provided script for norco q6 H moderate pain. She can also use robaxin for PRN muscle spasm relief   2. Mechanical Fall. No acute findings found on Xr lumbar spine.     3. Acute on chronic anemia.  Previous baseline hemoglobin 11 prior to admission. Since procedure has remained stable with no bleeding at 8-8.5, on discharge was 8.    4. Leukocytosis, seems chronic based on previous blood work. Likely exacerbated from stress related to recent fall/fracture. downtrended from [age] yo 12.6 prior to discharge. Other sources of infection ruled out and no localizing signs or symptoms.   5. Schizoaffective disorder, stable continue home lithium, lorazepam and sertraline, fluphenazine.    6. Hypertension, stable continue home lisinopril  7. 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  Did not need any cardiac preoperative clearance given stability.   Consultations:  Orthopedics  Procedures/Studies: Left total hip replacement, left hip, 09/27/2018  Discharge Exam: BP (!) 105/59   Pulse 91   Temp 98.2 F (36.8 C) (Oral)   Resp 14   Ht 5\' 5"  (1.651 m)   Wt 61.2 kg   SpO2 97%   BMI 22.47 kg/m   General: Lying in bed, no apparent distress Eyes: EOMI, anicteric ENT: Oral Mucosa clear and moist Cardiovascular: regular rate and rhythm, no murmurs, rubs or gallops, no edema, Respiratory: Normal respiratory effort on room air, lungs clear to auscultation bilaterally Abdomen: soft, non-distended, non-tender, normal bowel sounds Skin: surgical dressing in place on left lateral hip Neurologic: Grossly no focal neuro deficit.Mental status AAOx3, speech normal, Psychiatric:Appropriate affect, and mood   Discharge Instructions You were cared for by a hospitalist during your hospital stay. If you have any questions about your discharge medications or the care you received while you were in the hospital after  you are discharged, you can call the unit and asked to speak with the hospitalist on call if the hospitalist that took care of you is not available. Once you are discharged, your primary care physician will handle any further medical issues.  Please note that NO REFILLS for any discharge medications will be authorized once you are discharged, as it is imperative that you return to your primary care physician (or establish a relationship with a primary care physician if you do not have one) for your aftercare needs so that they can reassess your need for medications and monitor your lab values.  Discharge Instructions    Diet - low sodium heart healthy   Complete by:  As directed    Diet - low sodium heart healthy   Complete by:  As directed    Discharge instructions   Complete by:  As directed    Please follow with primary care in 7 days.   Increase activity slowly   Complete by:  As directed    Increase activity slowly   Complete by:  As directed    Weight bearing as tolerated   Complete by:  As directed      Allergies as of 10/03/2018      Reactions   Sulfonamide Derivatives Hives          Medication List    STOP taking these medications   traMADol 50 MG tablet Commonly known as:  ULTRAM     TAKE these medications   acetaminophen 500 MG tablet Commonly known as:  TYLENOL Take 500 mg by mouth every morning. What changed:  Another medication with the same name was removed. Continue taking this medication, and follow the directions you see here.   aspirin 81 MG EC tablet Take 1 tablet (81 mg total) by mouth daily.   benzocaine-resorcinol 5-2 % vaginal cream Commonly known as:  VAGISIL Place 1 application vaginally as needed for itching.   benztropine 1 MG tablet Commonly known as:  COGENTIN Take 1 mg by mouth 2 (two) times daily.   Cranberry 500 MG Tabs Take 500 mg by mouth 2 (two) times daily.   enoxaparin 40 MG/0.4ML injection Commonly known as:  LOVENOX Inject 0.4 mLs (40 mg total) into the skin daily.   FEROCON capsule Generic drug:  ferrous [id number] C-folic acid Take 1 capsule by mouth at bedtime.   fluPHENAZine 10 MG tablet Commonly known as:  PROLIXIN Take 20 mg by mouth  daily.   HYDROcodone-acetaminophen 7.5-325 MG tablet Commonly known as:  NORCO Take 1-2 tablets by mouth every 6 (six) hours as needed for moderate pain.   LATUDA 120 MG Tabs Generic drug:  Lurasidone HCl Take 120 mg by mouth daily.   lisinopril 5 MG tablet Commonly known as:  PRINIVIL,ZESTRIL Take 5 mg by mouth daily.   lithium carbonate 150 MG capsule Take 150-300 mg by mouth 2 (two) times daily. 300mg  at 8am 150mg  at 8pm   loratadine 10 MG tablet Commonly known as:  CLARITIN Take 10 mg by mouth daily.   LORazepam 0.5 MG tablet Commonly known as:  ATIVAN Take 1 tablet (0.5 mg total) by mouth 4 (four) times daily.   LORazepam 1 MG tablet Commonly known as:  ATIVAN Take 0.5 tablets (0.5 mg total) by mouth 2 (two) times daily as needed for anxiety.   Melatonin 5 MG Tabs Take 5 mg by mouth at bedtime.   methocarbamol 500 MG tablet Commonly known as:  ROBAXIN Take 1 tablet (  500 mg total) by mouth every 6 (six) hours as needed for muscle spasms.   metoprolol tartrate 25 MG tablet Commonly known as:  LOPRESSOR Take 1 tablet (25 mg total) by mouth 2 (two) times daily.   NUTRITIONAL SHAKE PLUS PO Take 1 Container by mouth 3 (three) times daily.   ENSURE Take 237 mLs by mouth 2 (two) times daily. Strawberry   polyethylene glycol packet Commonly known as:  MIRALAX / GLYCOLAX Take 17 g by mouth daily.   RISPERDAL CONSTA 37.5 MG injection Generic drug:  risperiDONE microspheres Inject 37.5 mg into the skin as directed. Every two weeks   sertraline 100 MG tablet Commonly known as:  ZOLOFT Take 100 mg by mouth daily.   SYSTANE BALANCE 0.6 % Soln Generic drug:  Propylene Glycol Place 1 drop into both eyes 3 (three) times daily.            Discharge Care Instructions  (From admission, onward)         Start     Ordered   09/27/18 0000  Weight bearing as tolerated     09/27/18 1524         Allergies  Allergen Reactions  . Sulfonamide Derivatives Hives          Contact information for follow-up providers    Leandrew Koyanagi, MD In 2 weeks.   Specialty:  Orthopedic Surgery Why:  For suture removal, For wound re-check Contact information: [place] [address] [phone]            Contact information for after-discharge care    Destination    [facility] .   Service:  Skilled Nursing Contact information: [address] [place] [phone]                   The results of significant diagnostics from this hospitalization (including imaging, microbiology, ancillary and laboratory) are listed below for reference.    Significant Diagnostic Studies: Dg Lumbar Spine Complete  Result Date: 09/26/2018 CLINICAL DATA:  Fall.  Back pain. EXAM: LUMBAR SPINE - COMPLETE 4+ VIEW COMPARISON:  05/16/2016 FINDINGS: Five lumbar type vertebral bodies. Lumbar levoscoliosis with Harrington rod in place and partially covered, spanning L2 superiorly. There is ankylosis/fusion of the spanned levels. Subjacent severe degenerative disease which limits radiographic evaluation. No acute finding seen in the lumbar spine. Known left proximal femur fracture and left hip osteoarthritis. IMPRESSION: 1. No acute finding in the lumbar spine. 2. Scoliosis with Harrington rod fusion. There is advanced lumbar degenerative disease below the fusion. 3. Known left proximal femur fracture. Electronically Signed   By: [place] Fantasia M.D.   On: 09/26/2018 08:31   Pelvis Portable  Result Date: 09/27/2018 CLINICAL DATA:  LEFT hip replacement EXAM: PORTABLE PELVIS 1-2 VIEWS COMPARISON:  Portable exam 1709 hours compared to intraoperative images of 09/27/2018 FINDINGS: LEFT hip prosthesis components are again identified. Two cerclage wires are present at the proximal femur. Bones demineralized. No definite fracture, dislocation or bone destruction identified on single AP view. IMPRESSION:  LEFT hip prosthesis without definite acute complication. Electronically Signed   By: Lavonia Dana M.D.   On: 09/27/2018 17:59   Ct Hip Left Wo Contrast  Result Date: 09/26/2018 CLINICAL DATA:  Left hip pain since a fall out of bed 09/19/2018. Initial encounter. EXAM: CT OF THE LEFT HIP WITHOUT CONTRAST TECHNIQUE: Multidetector CT imaging of the left hip was performed according to the standard protocol.  Multiplanar CT image reconstructions were also generated. COMPARISON:  Plain films left hip earlier today. FINDINGS: Bones/Joint/Cartilage As seen on the comparison exam, the patient has a fracture through the base of the left femoral neck with impaction of approximately 1.5 cm. No extension along the intertrochanteric ridge is identified. No other fracture is seen. Moderately severe left hip degenerative change is present with subchondral cyst formation and osteophytosis identified. Loss of disc space height and vacuum disc phenomenon are seen at L3-4, L4-5 and L5-S1. No lytic or sclerotic lesion is identified. Ligaments Suboptimally assessed by CT. Muscles and Tendons Intact. Soft tissues Imaged intrapelvic contents demonstrate atherosclerotic vascular disease. IMPRESSION: Mildly impacted fracture through the base of the left femoral neck. No other acute abnormality. Moderately severe left hip osteoarthritis. Lower lumbar spondylosis. Atherosclerosis. Electronically Signed   By: Inge Rise M.D.   On: 09/26/2018 10:07   Dg Chest Port 1 View  Result Date: 09/26/2018 CLINICAL DATA:  Left hip fracture.  Preoperative study. EXAM: PORTABLE CHEST 1 VIEW COMPARISON:  Chest x-ray dated June 17, 2016. FINDINGS: The patient remains rotated to the left. Stable mild cardiomegaly. Normal pulmonary vascularity. No focal consolidation, pleural effusion, or pneumothorax. No acute osseous abnormality. Unchanged thoracic dextroscoliosis status post Harrington rod placement. IMPRESSION: No active disease. Electronically  Signed   By: Titus Dubin M.D.   On: 09/26/2018 11:43   Dg C-arm 1-60 Min  Result Date: 09/27/2018 CLINICAL DATA:  Status post left hip replacement. EXAM: DG C-ARM 61-120 MIN; OPERATIVE LEFT HIP WITH PELVIS FLUOROSCOPY TIME:  42 seconds. COMPARISON:  Radiographs of September 26, 2018. FINDINGS: Two intraoperative fluoroscopic images demonstrate the patient be status post left total hip arthroplasty. The femoral and acetabular components appear to be well situated. Expected postoperative changes are seen in the surrounding soft tissues. IMPRESSION: Status post left total hip arthroplasty. Electronically Signed   By: Marijo Conception, M.D.   On: 09/27/2018 15:28   Dg Hip Operative Unilat W Or W/o Pelvis Left  Result Date: 09/27/2018 CLINICAL DATA:  Status post left hip replacement. EXAM: DG C-ARM 61-120 MIN; OPERATIVE LEFT HIP WITH PELVIS FLUOROSCOPY TIME:  42 seconds. COMPARISON:  Radiographs of September 26, 2018. FINDINGS: Two intraoperative fluoroscopic images demonstrate the patient be status post left total hip arthroplasty. The femoral and acetabular components appear to be well situated. Expected postoperative changes are seen in the surrounding soft tissues. IMPRESSION: Status post left total hip arthroplasty. Electronically Signed   By: Marijo Conception, M.D.   On: 09/27/2018 15:28   Dg Hip Unilat With Pelvis 2-3 Views Left  Result Date: 09/26/2018 CLINICAL DATA:  Fall from bed on Thursday. EXAM: DG HIP (WITH OR WITHOUT PELVIS) 2-3V LEFT COMPARISON:  None. FINDINGS: Proximal femur fracture, likely basicervical, with impaction. Although 4 images were obtained they all have a similar projection in this patient who is rotated to the left left hip. CT could further evaluate if clinically needed. Osteoarthritis with asymmetric spurring and sclerosis. No evident pelvic ring fracture. Negative right hip. IMPRESSION: 1. Proximal left femur fracture, likely basicervical femoral neck fracture. 2. Left hip  osteoarthritis. Electronically Signed   By: [place] Fantasia M.D.   On: 09/26/2018 08:26    Microbiology: Recent Results (from the past 240 hour(s))  Urine Culture     Status: Abnormal   Collection Time: 09/26/18  3:02 PM  Result Value Ref Range Status   Specimen Description   Final    URINE, RANDOM Performed at [facility], [place] [address], [place], [place] [address]    Special Requests   Final    NONE Performed at [facility], [place] [address], [place], [place] [address]    Culture >=100,000 COLONIES/mL ESCHERICHIA COLI (A)  Final   Report Status 09/28/2018 FINAL  Final   Organism ID, Bacteria ESCHERICHIA COLI (A)  Final      Susceptibility   Escherichia coli - MIC*    AMPICILLIN >=32 RESISTANT Resistant     CEFAZOLIN <=4 SENSITIVE Sensitive     CEFTRIAXONE <=1 SENSITIVE Sensitive     CIPROFLOXACIN >=4 RESISTANT Resistant     GENTAMICIN <=1 SENSITIVE Sensitive     IMIPENEM <=0.25 SENSITIVE Sensitive     NITROFURANTOIN <=16 SENSITIVE Sensitive     TRIMETH/SULFA <=20 SENSITIVE Sensitive     AMPICILLIN/SULBACTAM 16 INTERMEDIATE Intermediate     PIP/TAZO <=4 SENSITIVE Sensitive     Extended ESBL NEGATIVE Sensitive     * >=100,000 COLONIES/mL ESCHERICHIA COLI  Surgical pcr screen     Status: None   Collection Time: 09/26/18  7:22 PM  Result Value Ref Range Status   MRSA, PCR NEGATIVE NEGATIVE Final   Staphylococcus aureus NEGATIVE NEGATIVE Final    Comment: (NOTE) The Xpert SA Assay (FDA approved for NASAL specimens in patients 39 years of age and older), is one component of a comprehensive surveillance program. It is not intended to diagnose infection nor to guide or monitor treatment. Performed at [facility], [place] [address], [place], [place] [address]      Labs: Basic Metabolic Panel: Recent Labs  Lab 09/27/18 0322 09/28/18 0521 09/29/18 0321  NA 142 140 138  K 3.8 4.3 4.1  CL 112* 112* 107  CO2 21* 21* 21*  GLUCOSE  124* 140* 119*  BUN 14 18 12   CREATININE 1.00 0.99 0.83  CALCIUM 10.7* 10.0 9.7   Liver Function Tests: No results for input(s): AST, ALT, ALKPHOS, BILITOT, PROT, ALBUMIN in the last 168 hours. No results for input(s): LIPASE, AMYLASE in the last 168 hours. No results for input(s): AMMONIA in the last 168 hours. CBC: Recent Labs  Lab 09/28/18 0521 09/29/18 0321 09/30/18 0355 10/02/18 0932 10/03/18 0144  WBC 13.9* 13.6* 14.8* 10.8* 12.4*  HGB 8.9* 8.6* 8.5* 7.9* 8.0*  HCT 28.2* 27.8* 26.9* 25.5* 25.9*  MCV 98.3 96.9 97.5 96.6 97.7  PLT 247 252 289 401* 442*   Cardiac Enzymes: No results for input(s): CKTOTAL, CKMB, CKMBINDEX, TROPONINI in the last 168 hours. BNP: BNP (last 3 results) No results for input(s): BNP in the last 8760 hours.  ProBNP (last 3 results) No results for input(s): PROBNP in the last 8760 hours.  CBG: No results for input(s): GLUCAP in the last 168 hours.     Signed:  Desiree Hane, MD [facility] 10/03/2018, 9:25 AM

## 2018-10-03 NOTE — Progress Notes (Signed)
Patient will DC to: [place] date: 10/03/2018 Family notified:Damian Transport AC:[id number]  Per MD patient ready for DC to  [facility]. RN, patient, patient's family, and facility notified of DC. Discharge Summary sent to facility. RN given number for report [phone] Room 101a. DC packet on chart. Ambulance transport requested for patient.  CSW signing off.  [place], [place]

## 2018-10-03 NOTE — Clinical Social Work Placement (Signed)
   CLINICAL SOCIAL WORK PLACEMENT  NOTE  Date:  10/03/2018  Patient Details  Name: [name] MRN: [id number] Date of Birth: [birth year]  Clinical Social Work is seeking post-discharge placement for this patient at the [place] level of care (*CSW will initial, date and re-position this form in  chart as items are completed):      Patient/family provided with [facility] Work Department's list of facilities offering this level of care within the geographic area requested by the patient (or if unable, by the patient's family).  Yes   Patient/family informed of their freedom to choose among providers that offer the needed level of care, that participate in Medicare, Medicaid or managed care program needed by the patient, have an available bed and are willing to accept the patient.      Patient/family informed of [facility]'s ownership interest in [facility] and [facility], as well as of the fact that they are under no obligation to receive care at these facilities.  PASRR submitted to EDS on       PASRR number received on 09/30/18     Existing PASRR number confirmed on       FL2 transmitted to all facilities in geographic area requested by pt/family on 09/30/18     FL2 transmitted to all facilities within larger geographic area on       Patient informed that his/her managed care company has contracts with or will negotiate with certain facilities, including the following:        Yes   Patient/family informed of bed offers received.  Patient chooses bed at [facility]     Physician recommends and patient chooses bed at      Patient to be transferred to [facility] on 10/03/18.  Patient to be transferred to facility by [facility]     Patient family notified on 10/03/18 of transfer.  Name of family member notified:  Damian     PHYSICIAN       Additional Comment:     _______________________________________________ Alberteen Sam, LCSW 10/03/2018, 12:20 PM

## 2018-10-03 NOTE — Progress Notes (Signed)
PASRR number received: [id number] [place], [place] [phone]

## 2018-10-18 ENCOUNTER — Inpatient Hospital Stay (INDEPENDENT_AMBULATORY_CARE_PROVIDER_SITE_OTHER): Payer: Medicare Other | Admitting: Orthopaedic Surgery

## 2018-10-22 ENCOUNTER — Ambulatory Visit (INDEPENDENT_AMBULATORY_CARE_PROVIDER_SITE_OTHER): Payer: No Typology Code available for payment source | Admitting: Orthopaedic Surgery

## 2018-10-22 ENCOUNTER — Ambulatory Visit (INDEPENDENT_AMBULATORY_CARE_PROVIDER_SITE_OTHER): Payer: Medicare Other

## 2018-10-22 DIAGNOSIS — M25552 Pain in left hip: Secondary | ICD-10-CM

## 2018-10-22 DIAGNOSIS — S72002A Fracture of unspecified part of neck of left femur, initial encounter for closed fracture: Secondary | ICD-10-CM

## 2018-10-22 NOTE — Progress Notes (Signed)
Post-Op Visit Note   Patient: Heidi Blevins           Date of Birth: [birth year]           MRN: [id number] Visit Date: 10/22/2018 PCP: [name], MD   Assessment & Plan:  Chief Complaint:  Chief Complaint  Patient presents with  . Left Hip - Pain, Routine Post Op   Visit Diagnoses:  1. Closed displaced fracture of left femoral neck (HCC)   2. Pain in left hip     Plan: Patient is 25 days status post left total hip replacement due to left femoral neck fracture.  She is currently at a rehab facility.  She is doing well overall and reports minimal pain.  She continues to improve with physical therapy.  She presents today with her son.  Her surgical incision is well-healed without signs of infection.  She has good range of motion of her hip without pain.  Leg lengths are equal.  From my standpoint she is doing well overall.  We remove the sutures and place Steri-Strips.  Continue with physical therapy for mobilization.  Recheck in 4 weeks with repeat standing AP pelvis and lateral hip.  Follow-Up Instructions: Return in about 4 weeks (around 11/19/2018).   Orders:  Orders Placed This Encounter  Procedures  . XR HIP UNILAT W OR W/O PELVIS 2-3 VIEWS LEFT   No orders of the defined types were placed in this encounter.   Imaging: Xr Hip Unilat W Or W/o Pelvis 2-3 Views Left  Result Date: 10/22/2018 Stable left total hip replacement without evidence of subsidence or complications   PMFS History: Patient Active Problem List   Diagnosis Date Noted  . Closed displaced fracture of left femoral neck ([place]) 09/26/2018  . AKI (acute kidney injury) ([place]) 06/13/2016  . UTI (urinary tract infection) 06/13/2016  . Essential hypertension 06/13/2016  . Urinary tract infection without hematuria   . Complicated UTI (urinary tract infection) 05/03/2016  . Hypercalcemia 05/03/2016  . Dehydration 05/03/2016  . Acute encephalopathy 05/03/2016  . 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  Schizoaffective disorder ([place]) 05/03/2016  . Bifascicular block 05/03/2016   Past Medical History:  Diagnosis Date  . Anxiety   . Arrhythmia   . Bifascicular block   . Depression   . Hypertension   . Insomnia   . Schizoaffective disorder   . Scoliosis   . Vitamin D deficiency     Family History  Problem Relation Age of Onset  . CVA Mother   . Hypertension Mother   . Lupus Sister   . Hypertension Brother   . Peptic Ulcer Disease Father   . Colon cancer Neg Hx     Past Surgical History:  Procedure Laterality Date  . BACK SURGERY     "as a child after she fell out of a tree"  . TONSILLECTOMY    . TOTAL HIP ARTHROPLASTY Left 09/27/2018   Procedure: LEFT TOTAL HIP ARTHROPLASTY ANTERIOR APPROACH;  Surgeon: Leandrew Koyanagi, MD;  Location: [facility];  Service: Orthopedics;  Laterality: Left;   Social History   Occupational History  . Occupation: Unemployed  Tobacco Use  . Smoking status: Current Every Day Smoker    Packs/day: 0.25    Years: 40.00    Pack years: 10.00    Types: Cigarettes  . Smokeless tobacco: Never Used  Substance and Sexual Activity  . Alcohol use: No  . Drug use: No  . Sexual activity: Never

## 2018-11-18 ENCOUNTER — Telehealth (INDEPENDENT_AMBULATORY_CARE_PROVIDER_SITE_OTHER): Payer: Self-pay

## 2018-11-18 NOTE — Telephone Encounter (Signed)
  Called patient. No answer LMOM to return our call. If they return call, please ask them screening questions below. Thank you.   Do you have now or have you had in the past 7 days a fever and/or chills?   Do you have now or have you had in the past 7 days a cough?   Do you have now or have you had in the last 7 days nausea, vomiting or abdominal pain?   Have you been exposed to anyone who has tested positive for COVID-19?   Have you or anyone who lives with you traveled within the last month? 

## 2018-11-19 ENCOUNTER — Ambulatory Visit (INDEPENDENT_AMBULATORY_CARE_PROVIDER_SITE_OTHER): Payer: Medicare Other | Admitting: Orthopaedic Surgery

## 2019-03-16 ENCOUNTER — Encounter (HOSPITAL_COMMUNITY): Payer: Self-pay | Admitting: Emergency Medicine

## 2019-03-16 ENCOUNTER — Emergency Department (HOSPITAL_COMMUNITY)
Admission: EM | Admit: 2019-03-16 | Discharge: 2019-03-16 | Disposition: A | Discharge status: Home / Self Care | Payer: Medicare Other | Attending: Emergency Medicine | Admitting: Emergency Medicine

## 2019-03-16 ENCOUNTER — Emergency Department (HOSPITAL_COMMUNITY): Payer: Medicare Other

## 2019-03-16 ENCOUNTER — Other Ambulatory Visit: Payer: Self-pay

## 2019-03-16 DIAGNOSIS — Z96642 Presence of left artificial hip joint: Secondary | ICD-10-CM | POA: Diagnosis not present

## 2019-03-16 DIAGNOSIS — N390 Urinary tract infection, site not specified: Secondary | ICD-10-CM

## 2019-03-16 DIAGNOSIS — Z79899 Other long term (current) drug therapy: Secondary | ICD-10-CM | POA: Insufficient documentation

## 2019-03-16 DIAGNOSIS — R0602 Shortness of breath: Secondary | ICD-10-CM | POA: Insufficient documentation

## 2019-03-16 DIAGNOSIS — R0789 Other chest pain: Secondary | ICD-10-CM | POA: Diagnosis not present

## 2019-03-16 DIAGNOSIS — R05 Cough: Secondary | ICD-10-CM | POA: Diagnosis not present

## 2019-03-16 DIAGNOSIS — Z7982 Long term (current) use of aspirin: Secondary | ICD-10-CM | POA: Insufficient documentation

## 2019-03-16 DIAGNOSIS — Z7901 Long term (current) use of anticoagulants: Secondary | ICD-10-CM | POA: Insufficient documentation

## 2019-03-16 DIAGNOSIS — I10 Essential (primary) hypertension: Secondary | ICD-10-CM | POA: Insufficient documentation

## 2019-03-16 DIAGNOSIS — R079 Chest pain, unspecified: Secondary | ICD-10-CM

## 2019-03-16 DIAGNOSIS — Z20828 Contact with and (suspected) exposure to other viral communicable diseases: Secondary | ICD-10-CM | POA: Diagnosis not present

## 2019-03-16 DIAGNOSIS — R531 Weakness: Secondary | ICD-10-CM | POA: Insufficient documentation

## 2019-03-16 DIAGNOSIS — F17210 Nicotine dependence, cigarettes, uncomplicated: Secondary | ICD-10-CM | POA: Diagnosis not present

## 2019-03-16 LAB — BASIC METABOLIC PANEL
Anion gap: 7 (ref 5–15)
BUN: 7 mg/dL — ABNORMAL LOW (ref 8–23)
CO2: 21 mmol/L — ABNORMAL LOW (ref 22–32)
Calcium: 10.4 mg/dL — ABNORMAL HIGH (ref 8.9–10.3)
Chloride: 110 mmol/L (ref 98–111)
Creatinine, Ser: 0.96 mg/dL (ref 0.44–1.00)
GFR calc Af Amer: >60 mL/min (ref >60)
GFR calc non Af Amer: >60 mL/min (ref >60)
Glucose, Bld: 135 mg/dL — ABNORMAL HIGH (ref 70–99)
Potassium: 4.3 mmol/L (ref 3.5–5.1)
Sodium: 138 mmol/L (ref 135–145)

## 2019-03-16 LAB — SARS CORONAVIRUS 2 BY RT PCR (HOSPITAL ORDER, PERFORMED IN ~~LOC~~ HOSPITAL LAB): SARS Coronavirus 2: NEGATIVE

## 2019-03-16 LAB — LACTIC ACID, PLASMA
Lactic Acid, Venous: 1.4 mmol/L (ref 0.5–1.9)
Lactic Acid, Venous: 0.8 mmol/L (ref 0.5–1.9)

## 2019-03-16 LAB — URINALYSIS, ROUTINE W REFLEX MICROSCOPIC
Bilirubin Urine: NEGATIVE
Glucose, UA: NEGATIVE mg/dL
Ketones, ur: NEGATIVE mg/dL
Nitrite: POSITIVE — AB
Protein, ur: NEGATIVE mg/dL
Specific Gravity, Urine: 1.008 (ref 1.005–1.030)
pH: 6 (ref 5.0–8.0)

## 2019-03-16 LAB — PROCALCITONIN: Procalcitonin: <0.1 ng/mL

## 2019-03-16 LAB — CBC
HCT: 37.9 % (ref 36.0–46.0)
Hemoglobin: 12 g/dL (ref 12.0–15.0)
MCH: 31.1 pg (ref 26.0–34.0)
MCHC: 31.7 g/dL (ref 30.0–36.0)
MCV: 98.2 fL (ref 80.0–100.0)
Platelets: 299 10*3/uL (ref 150–400)
RBC: 3.86 MIL/uL — ABNORMAL LOW (ref 3.87–5.11)
RDW: 12.9 % (ref 11.5–15.5)
WBC: 16.5 10*3/uL — ABNORMAL HIGH (ref 4.0–10.5)
nRBC: 0 % (ref 0.0–0.2)

## 2019-03-16 LAB — LACTATE DEHYDROGENASE
LDH: 295 U/L — ABNORMAL HIGH (ref 98–192)
LDH: 155 U/L (ref 98–192)

## 2019-03-16 LAB — TROPONIN I (HIGH SENSITIVITY)
Troponin I (High Sensitivity): 3 ng/L (ref <18)
Troponin I (High Sensitivity): 6 ng/L (ref <18)

## 2019-03-16 LAB — C-REACTIVE PROTEIN: CRP: 3.2 mg/dL — ABNORMAL HIGH (ref <1.0)

## 2019-03-16 LAB — TRIGLYCERIDES: Triglycerides: 63 mg/dL (ref <150)

## 2019-03-16 LAB — FIBRINOGEN: Fibrinogen: 558 mg/dL — ABNORMAL HIGH (ref 210–475)

## 2019-03-16 LAB — FERRITIN: Ferritin: 186 ng/mL (ref 11–307)

## 2019-03-16 LAB — D-DIMER, QUANTITATIVE: D-Dimer, Quant: 0.77 ug/mL-FEU — ABNORMAL HIGH (ref 0.00–0.50)

## 2019-03-16 MED ORDER — SODIUM CHLORIDE 0.9 % IV SOLN
1.0000 g | Freq: Once | INTRAVENOUS | Status: AC
  Administered 2019-03-16: 1 g via INTRAVENOUS
  Filled 2019-03-16: qty 10

## 2019-03-16 MED ORDER — MELATONIN 5 MG PO TABS: 5.0000 mg | ORAL_TABLET | Freq: Every day | ORAL | Status: DC

## 2019-03-16 MED ORDER — METOPROLOL TARTRATE 25 MG PO TABS: 25.0000 mg | ORAL_TABLET | Freq: Two times a day (BID) | ORAL | Status: DC

## 2019-03-16 MED ORDER — LORAZEPAM 1 MG PO TABS: 0.5000 mg | ORAL_TABLET | Freq: Once | ORAL | Status: DC

## 2019-03-16 MED ORDER — ONDANSETRON HCL 4 MG/2ML IJ SOLN: 4.0000 mg | Freq: Once | INTRAMUSCULAR | Status: DC

## 2019-03-16 MED ORDER — SODIUM CHLORIDE 0.9% FLUSH
3.0000 mL | Freq: Once | INTRAVENOUS | Status: AC
  Administered 2019-03-16: 08:00:00 3 mL via INTRAVENOUS

## 2019-03-16 MED ORDER — ACETAMINOPHEN 325 MG PO TABS
650.0000 mg | ORAL_TABLET | Freq: Once | ORAL | Status: AC
  Administered 2019-03-16: 650 mg via ORAL
  Filled 2019-03-16: qty 2

## 2019-03-16 MED ORDER — LURASIDONE HCL 120 MG PO TABS: 120.0000 mg | ORAL_TABLET | Freq: Every day | ORAL | Status: DC

## 2019-03-16 MED ORDER — BENZOCAINE-RESORCINOL 5-2 % VA CREA: 1.0000 "application " | TOPICAL_CREAM | VAGINAL | Status: DC | PRN

## 2019-03-16 MED ORDER — LORAZEPAM 1 MG PO TABS: 0.5000 mg | ORAL_TABLET | Freq: Four times a day (QID) | ORAL | Status: DC

## 2019-03-16 MED ORDER — CEPHALEXIN 250 MG PO CAPS: 250.0000 mg | ORAL_CAPSULE | Freq: Four times a day (QID) | ORAL | 0 refills | Status: DC

## 2019-03-16 MED ORDER — ASPIRIN 81 MG PO TBEC: 81.0000 mg | DELAYED_RELEASE_TABLET | Freq: Every day | ORAL | Status: DC

## 2019-03-16 MED ORDER — LORATADINE 10 MG PO TABS: 10.0000 mg | ORAL_TABLET | Freq: Every day | ORAL | Status: DC

## 2019-03-16 MED ORDER — DICLOFENAC SODIUM 1 % TD GEL: 2.0000 g | Freq: Four times a day (QID) | TRANSDERMAL | Status: DC

## 2019-03-16 MED ORDER — POLYETHYLENE GLYCOL 3350 17 G PO PACK: 17.0000 g | PACK | Freq: Every day | ORAL | Status: DC

## 2019-03-16 MED ORDER — ACETAMINOPHEN 500 MG PO TABS: 500.0000 mg | ORAL_TABLET | Freq: Every morning | ORAL | Status: DC

## 2019-03-16 MED ORDER — LITHIUM CARBONATE 150 MG PO CAPS: 150.0000 mg | ORAL_CAPSULE | Freq: Two times a day (BID) | ORAL | Status: DC

## 2019-03-16 MED ORDER — PROPYLENE GLYCOL 0.6 % OP SOLN: 1.0000 [drp] | Freq: Three times a day (TID) | OPHTHALMIC | Status: DC

## 2019-03-16 MED ORDER — LORAZEPAM 1 MG PO TABS: 0.5000 mg | ORAL_TABLET | Freq: Two times a day (BID) | ORAL | Status: DC | PRN

## 2019-03-16 MED ORDER — SERTRALINE HCL 100 MG PO TABS: 100.0000 mg | ORAL_TABLET | Freq: Every day | ORAL | Status: DC

## 2019-03-16 MED ORDER — CEPHALEXIN 500 MG PO CAPS: 500.0000 mg | ORAL_CAPSULE | Freq: Four times a day (QID) | ORAL | 0 refills | Status: DC

## 2019-03-16 NOTE — ED Provider Notes (Signed)
[facility] EMERGENCY DEPARTMENT Provider Note   CSN: [id number] Arrival date & time: 03/16/19  5277     History   Chief Complaint Chief Complaint  Patient presents with  . Chest Pain    HPI Heidi Blevins is a 68 y.o. female.  She is brought in by EMS from her facility at [facility] for evaluation of shortness of breath, cough, chest pressure 5 out of 10 central chest without any radiation along with diarrhea.  She also has headache and body aches.  She is febrile here.  She said there is no reported Covid cases at her facility.     The history is provided by the patient.  Chest Pain Pain location:  Substernal area Pain quality: pressure   Pain radiates to:  Does not radiate Pain severity:  Moderate Onset quality:  Gradual Duration:  12 hours Timing:  Constant Progression:  Unchanged Chronicity:  New Context: at rest   Relieved by:  None tried Worsened by:  Nothing Ineffective treatments:  None tried Associated symptoms: cough, fever, headache and shortness of breath   Associated symptoms: no abdominal pain, no back pain, no lower extremity edema and no vomiting   Risk factors: hypertension and smoking     Past Medical History:  Diagnosis Date  . Anxiety   . Arrhythmia   . Bifascicular block   . Depression   . Hypertension   . Insomnia   . Schizoaffective disorder   . Scoliosis   . Vitamin D deficiency     Patient Active Problem List   Diagnosis Date Noted  . Closed displaced fracture of left femoral neck ([place]) 09/26/2018  . AKI (acute kidney injury) ([place]) 06/13/2016  . UTI (urinary tract infection) 06/13/2016  . Essential hypertension 06/13/2016  . Urinary tract infection without hematuria   . Complicated UTI (urinary tract infection) 05/03/2016  . Hypercalcemia 05/03/2016  . Dehydration 05/03/2016  . Acute encephalopathy 05/03/2016  . 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 Schizoaffective disorder ([place]) 05/03/2016  .  Bifascicular block 05/03/2016    Past Surgical History:  Procedure Laterality Date  . BACK SURGERY     "as a child after she fell out of a tree"  . TONSILLECTOMY    . 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      Home Medications    Prior to Admission medications   Medication Sig Start Date End Date Taking? Authorizing Provider  acetaminophen (TYLENOL) 500 MG tablet Take 500 mg by mouth every morning.     [provider]  aspirin EC 81 MG EC tablet Take 1 tablet (81 mg total) by mouth daily. 05/06/16   Doreatha Lew, MD  benzocaine-resorcinol (VAGISIL) 5-2 % vaginal cream Place 1 application vaginally as needed for itching.    [provider]  benztropine (COGENTIN) 1 MG tablet Take 1 mg by mouth 2 (two) times daily.    [provider]  Cranberry 500 MG TABS Take 500 mg by mouth 2 (two) times daily.     [provider]  enoxaparin (LOVENOX) 40 MG/0.4ML injection Inject 0.4 mLs (40 mg total) into the skin daily. 09/27/18   Leandrew Koyanagi, MD  ENSURE (ENSURE) Take 237 mLs by mouth 2 (two) times daily. Strawberry  [provider]  ferrous [id number] C-folic acid (FEROCON) capsule Take 1 capsule by mouth at bedtime.    [provider]  fluPHENAZine (PROLIXIN) 10 MG tablet Take 20 mg by mouth daily.     [provider]  HYDROcodone-acetaminophen (NORCO) 7.5-325 MG tablet Take 1-2 tablets by mouth every 6 (six) hours as needed for moderate pain. 09/27/18   Leandrew Koyanagi, MD  LATUDA 120 MG TABS Take 120 mg by mouth daily. 09/11/18   [provider]  lisinopril (PRINIVIL,ZESTRIL) 5 MG tablet Take 5 mg by mouth daily. 09/11/18   [provider]  lithium carbonate 150 MG capsule Take 150-300 mg by mouth 2 (two)  times daily. 300mg  at 8am 150mg  at 8pm 09/11/18   [provider]  loratadine (CLARITIN) 10 MG tablet Take 10 mg by mouth daily.    [provider]  LORazepam (ATIVAN) 0.5 MG tablet Take 1 tablet (0.5 mg total) by mouth 4 (four) times daily. 10/03/18   Desiree Hane, MD  LORazepam (ATIVAN) 1 MG tablet Take 0.5 tablets (0.5 mg total) by mouth 2 (two) times daily as needed for anxiety. 10/03/18   Oretha Milch D, MD  Melatonin 5 MG TABS Take 5 mg by mouth at bedtime.    [provider]  methocarbamol (ROBAXIN) 500 MG tablet Take 1 tablet (500 mg total) by mouth every 6 (six) hours as needed for muscle spasms. 10/03/18   Oretha Milch D, MD  metoprolol tartrate (LOPRESSOR) 25 MG tablet Take 1 tablet (25 mg total) by mouth 2 (two) times daily. 05/06/16   Doreatha Lew, MD  Nutritional Supplements (NUTRITIONAL SHAKE PLUS PO) Take 1 Container by mouth 3 (three) times daily.    [provider]  polyethylene glycol (MIRALAX / GLYCOLAX) packet Take 17 g by mouth daily. 10/03/18   Desiree Hane, MD  Propylene Glycol (SYSTANE BALANCE) 0.6 % SOLN Place 1 drop into both eyes 3 (three) times daily.     [provider]  RISPERDAL CONSTA 37.5 MG injection Inject 37.5 mg into the skin as directed. Every two weeks 09/16/18   [provider]  sertraline (ZOLOFT) 100 MG tablet Take 100 mg by mouth daily.     [provider]    Family History Family History  Problem Relation Age of Onset  . CVA Mother   . Hypertension Mother   . Lupus Sister   . Hypertension Brother   . Peptic Ulcer Disease Father   . Colon cancer Neg Hx     Social History Social History   Tobacco Use  . Smoking status: Current Every Day Smoker    Packs/day: 0.25    Years: 40.00    Pack years: 10.00    Types: Cigarettes  . Smokeless tobacco: Never Used  Substance Use Topics  . Alcohol use: No  . Drug use: No     Allergies   Sulfonamide derivatives   Review of  Systems Review of Systems  Constitutional: Positive for fever.  HENT: Negative for sore throat.   Eyes: Negative for visual disturbance.  Respiratory: Positive for cough and shortness of breath.   Cardiovascular: Positive for chest pain.  Gastrointestinal: Negative for abdominal pain and vomiting.  Genitourinary: Negative for dysuria.  Musculoskeletal: Positive for myalgias. Negative for back pain.  Skin: Negative for rash.  Neurological: Positive for headaches.     Physical Exam Updated Vital Signs BP (!) 145/75 (BP Location: Right Arm)   Pulse 87  Temp (!) 100.7 F (38.2 C) (Rectal)   Resp 18   Wt 61 kg   SpO2 94%   BMI 22.38 kg/m   Physical Exam Vitals signs and nursing note reviewed.  Constitutional:      General: She is not in acute distress.    Appearance: She is well-developed.  HENT:     Head: Normocephalic and atraumatic.  Eyes:     Conjunctiva/sclera: Conjunctivae normal.  Neck:     Musculoskeletal: Neck supple.  Cardiovascular:     Rate and Rhythm: Normal rate and regular rhythm.     Heart sounds: Normal heart sounds. No murmur.  Pulmonary:     Effort: Pulmonary effort is normal. No respiratory distress.     Breath sounds: Rhonchi present.  Abdominal:     Palpations: Abdomen is soft.     Tenderness: There is no abdominal tenderness.  Musculoskeletal: Normal range of motion.     Right lower leg: She exhibits no tenderness. No edema.     Left lower leg: She exhibits no tenderness. No edema.  Skin:    General: Skin is warm and dry.     Capillary Refill: Capillary refill takes less than 2 seconds.  Neurological:     General: No focal deficit present.     Mental Status: She is alert.      ED Treatments / Results  Labs (all labs ordered are listed, but only abnormal results are displayed) Labs Reviewed  BASIC METABOLIC PANEL - Abnormal; Notable for the following components:      Result Value   CO2 21 (*)    Glucose, Bld 135 (*)    BUN 7 (*)     Calcium 10.4 (*)    All other components within normal limits  CBC - Abnormal; Notable for the following components:   WBC 16.5 (*)    RBC 3.86 (*)    All other components within normal limits  LACTATE DEHYDROGENASE - Abnormal; Notable for the following components:   LDH 295 (*)    All other components within normal limits  C-REACTIVE PROTEIN - Abnormal; Notable for the following components:   CRP 3.2 (*)    All other components within normal limits  URINALYSIS, ROUTINE W REFLEX MICROSCOPIC - Abnormal; Notable for the following components:   APPearance HAZY (*)    Hgb urine dipstick SMALL (*)    Nitrite POSITIVE (*)    Leukocytes,Ua MODERATE (*)    Bacteria, UA RARE (*)    All other components within normal limits  D-DIMER, QUANTITATIVE (NOT AT [facility]) - Abnormal; Notable for the following components:   D-Dimer, Quant 0.77 (*)    All other components within normal limits  FIBRINOGEN - Abnormal; Notable for the following components:   Fibrinogen 558 (*)    All other components within normal limits  SARS CORONAVIRUS 2 (HOSPITAL ORDER, [place] [facility])  CULTURE, BLOOD (ROUTINE X 2)  CULTURE, BLOOD (ROUTINE X 2)  URINE CULTURE  LACTIC ACID, PLASMA  LACTIC ACID, PLASMA  PROCALCITONIN  FERRITIN  TRIGLYCERIDES  LACTATE DEHYDROGENASE  TROPONIN I (HIGH SENSITIVITY)  TROPONIN I (HIGH SENSITIVITY)    EKG EKG Interpretation  Date/Time:  Sunday March 16 2019 06:38:01 EDT Ventricular Rate:  96 PR Interval:    QRS Duration: 140 QT Interval:  374 QTC Calculation: 473 R Axis:   -85 Text Interpretation:  Sinus rhythm RBBB and LAFB poor baseline similar to prior 1/20 Confirmed by Aletta Edouard [phone]) on 03/16/2019 6:57:40  AM   Radiology Dg Chest Portable 1 View  Result Date: 03/16/2019 CLINICAL DATA:  Chest pressure. History of hypertension. EXAM: PORTABLE CHEST 1 VIEW COMPARISON:  X-rays dated 09/26/2018 and 06/17/2016. FINDINGS: Heart size and mediastinal  contours are stable. Lungs are clear. No pleural effusion or pneumothorax seen. No acute appearing osseous abnormality. Fixation rod within the thoracolumbar spine appears stable in position. IMPRESSION: No active disease. No evidence of pneumonia or pulmonary edema. Electronically Signed   By: Franki Cabot M.D.   On: 03/16/2019 07:39    Procedures Procedures (including critical care time)  Medications Ordered in ED Medications  ondansetron (ZOFRAN) injection 4 mg (4 mg Intravenous Not Given 03/16/19 1354)  LORazepam (ATIVAN) tablet 0.5 mg (0.5 mg Oral Not Given 03/16/19 1355)  diclofenac sodium (VOLTAREN) 1 % transdermal gel 2 g (has no administration in time range)  sodium chloride flush (NS) 0.9 % injection 3 mL (3 mLs Intravenous Given 03/16/19 0744)  acetaminophen (TYLENOL) tablet 650 mg (650 mg Oral Given 03/16/19 0743)  cefTRIAXone (ROCEPHIN) 1 g in sodium chloride 0.9 % 100 mL IVPB (0 g Intravenous Stopped 03/16/19 1248)     Initial Impression / Assessment and Plan / ED Course  I have reviewed the triage vital signs and the nursing notes.  Pertinent labs & imaging results that were available during my care of the patient were reviewed by me and considered in my medical decision making (see chart for details).  Clinical Course as of Mar 15 1724  Sun Mar 15, 1060  5684 68 year old female nursing home resident smoker here with chest pain shortness of breath fever cough.  EKG was read by the computer as concern for an inferior acute MI.  She is a little right bundle branch block and left anterior fascicular block and some poor baseline that I think is more artifact than true MI.  Differential includes Covid, pneumonia, ACS, sepsis, UTI, metabolic derangement   [MB]  31 Nurses cathed patient for urine and she comes up with a 21-50 whites nitrite positive.  Will cover with ceftriaxone.   [MB]  1207 Patient attempted to get up but was still very weak and unable.  I have put a call in for  unassigned for admission.   [MB]  7124 Internal medicine accepted the patient for admission but they called me back and said that the patient was hoping to be able to be discharged.  We will get her up and ambulate her make sure she is safe to go back because she is only in an assisted living situation.   [MB]  5809 Patient was able to ambulate safely here now.  We will discharge her back to her facility on oral antibiotics.   [MB]    Clinical Course User Index [MB] Hayden Rasmussen, MD   Heidi Blevins was evaluated in Emergency Department on 03/16/2019 for the symptoms described in the history of present illness. She was evaluated in the context of the global COVID-19 pandemic, which necessitated consideration that the patient might be at risk for infection with the SARS-CoV-2 virus that causes COVID-19. Institutional protocols and algorithms that pertain to the evaluation of patients at risk for COVID-19 are in a state of rapid change based on information released by regulatory bodies including the CDC and federal and state organizations. These policies and algorithms were followed during the patient's care in the ED.     Final diagnoses:  Nonspecific chest pain  Weakness  Acute lower UTI  Final Clinical Impressions(s) / ED Diagnoses   Final diagnoses:  None    ED Discharge Orders    None       Hayden Rasmussen, MD 03/16/19 1726

## 2019-03-16 NOTE — ED Notes (Signed)
Pt discharge reviewed. Pt unable to sign due to no signature pad. Pt discharged with PTAR.

## 2019-03-16 NOTE — ED Triage Notes (Signed)
Pt transported from [place] for c/o chest pressure "all night" RBBB on EKG, hx of same.  ASA 324mg  given. IV est. +shob, RA sat 97%

## 2019-03-16 NOTE — ED Notes (Signed)
Pt ambulated with the RN and tech. Pt ambulated approx 50 ft with minimal assistance. Pt helped back into bed.

## 2019-03-16 NOTE — Discharge Instructions (Addendum)
You were seen in the emergency department for chest pain along with cough and weakness and fever.  Your Covid test was negative.  You had blood work and a chest x-ray along with a urinalysis.  We offered you admission but you felt like you could return back to your facility.  We are prescribing you an antibiotic to take for urinary tract infection.  Please drink plenty of fluids and use Tylenol as needed for pain and fever.  Follow-up with your doctor and return if any worsening symptoms.

## 2019-03-16 NOTE — Consult Note (Signed)
Date: 03/16/2019               Patient Name:  Heidi Blevins MRN: [id number]  DOB: 30-May-1951 Age / Sex: 68 y.o., female   PCP: [name], MD         Requesting Physician: Dr. Melina Copa, Rebeca Alert, MD    Consulting Reason:  Chest Pain, UT, Weakness, Evaluate for Admission     Chief Complaint: Chest pain, Fever  History of Present Illness: Heidi Blevins is a 68 yo F with a Hx of Schizoaffective disorder, HTN, and CVD who presents from her group home with chest pain. She reports 1 day of chest pain and a fever of 99 at her group home. Her chest pain is worse with deep inspiration or palpation.  She reports a chronic cough, weakness, and mild sensation of dyspnea; which is unchanged from her baseline. She states at times it feels as though she cant catch her breath and when questioned attributes this to her chest pain. She states she would like to get back to her facility if she is on the right medications. She also reports nausea. Denies body aches, changes in cough, new SOB   In the ED: CXR was w/o acute disease; BMP WNL, CBC Showed WBC 16, Trop (-) x2, LA (-) x2, Ferritin and TGs WNL; CROP 3.2, LDH 295. Blood cultures and Urine cultures obtained. U/A consistent with UTI.   Meds: Current Facility-Administered Medications  Medication Dose Route Frequency Provider Last Rate Last Dose  . diclofenac sodium (VOLTAREN) 1 % transdermal gel 2 g  2 g Topical QID Neva Seat, MD      . LORazepam (ATIVAN) tablet 0.5 mg  0.5 mg Oral Once Neva Seat, MD      . metoprolol tartrate (LOPRESSOR) tablet 25 mg  25 mg Oral BID Neva Seat, MD      . ondansetron [facility]) injection 4 mg  4 mg Intravenous Once Neva Seat, MD       Current Outpatient Medications  Medication Sig Dispense Refill  . acetaminophen (TYLENOL) 500 MG tablet Take 500 mg by mouth every morning.     Marland Kitchen aspirin EC 81 MG EC tablet Take 1 tablet (81 mg total) by mouth daily. 30 tablet 0  . benzocaine-resorcinol  (VAGISIL) 5-2 % vaginal cream Place 1 application vaginally as needed for itching.    . benztropine (COGENTIN) 1 MG tablet Take 1 mg by mouth 2 (two) times daily.    . cephALEXin (KEFLEX) 500 MG capsule Take 1 capsule (500 mg total) by mouth 4 (four) times daily. 28 capsule 0  . Cranberry 500 MG TABS Take 500 mg by mouth 2 (two) times daily.     Marland Kitchen enoxaparin (LOVENOX) 40 MG/0.4ML injection Inject 0.4 mLs (40 mg total) into the skin daily. 14 Syringe 0  . ENSURE (ENSURE) Take 237 mLs by mouth 2 (two) times daily. Strawberry    . ferrous [id number] C-folic acid (FEROCON) capsule Take 1 capsule by mouth at bedtime.    . fluPHENAZine (PROLIXIN) 10 MG tablet Take 20 mg by mouth daily.     Marland Kitchen HYDROcodone-acetaminophen (NORCO) 7.5-325 MG tablet Take 1-2 tablets by mouth every 6 (six) hours as needed for moderate pain. 30 tablet 0  . LATUDA 120 MG TABS Take 120 mg by mouth daily.    Marland Kitchen lisinopril (PRINIVIL,ZESTRIL) 5 MG tablet Take 5 mg by mouth daily.    Marland Kitchen lithium carbonate 150 MG capsule Take 150-300 mg by mouth 2 (two) times  daily. 300mg  at 8am 150mg  at 8pm    . loratadine (CLARITIN) 10 MG tablet Take 10 mg by mouth daily.    Marland Kitchen LORazepam (ATIVAN) 0.5 MG tablet Take 1 tablet (0.5 mg total) by mouth 4 (four) times daily. 16 tablet 0  . LORazepam (ATIVAN) 1 MG tablet Take 0.5 tablets (0.5 mg total) by mouth 2 (two) times daily as needed for anxiety. 5 tablet 0  . Melatonin 5 MG TABS Take 5 mg by mouth at bedtime.    . methocarbamol (ROBAXIN) 500 MG tablet Take 1 tablet (500 mg total) by mouth every 6 (six) hours as needed for muscle spasms. 30 tablet 0  . metoprolol tartrate (LOPRESSOR) 25 MG tablet Take 1 tablet (25 mg total) by mouth 2 (two) times daily. 60 tablet 0  . Nutritional Supplements (NUTRITIONAL SHAKE PLUS PO) Take 1 Container by mouth 3 (three) times daily.    . polyethylene glycol (MIRALAX / GLYCOLAX) packet Take 17 g by mouth daily. 14 each 0  . Propylene Glycol (SYSTANE BALANCE)  0.6 % SOLN Place 1 drop into both eyes 3 (three) times daily.     Marland Kitchen RISPERDAL CONSTA 37.5 MG injection Inject 37.5 mg into the skin as directed. Every two weeks    . sertraline (ZOLOFT) 100 MG tablet Take 100 mg by mouth daily.       Allergies: Allergies as of 03/16/2019 - Review Complete 09/30/2018  Allergen Reaction Noted  . Sulfonamide derivatives Hives    Past Medical History:  Diagnosis Date  . Anxiety   . Arrhythmia   . Bifascicular block   . Depression   . Hypertension   . Insomnia   . Schizoaffective disorder   . Scoliosis   . Vitamin D deficiency    Past Surgical History:  Procedure Laterality Date  . BACK SURGERY     "as a child after she fell out of a tree"  . TONSILLECTOMY    . 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 CVA Mother   . Hypertension Mother   . Lupus Sister   . Hypertension Brother   . Peptic Ulcer Disease Father   . Colon cancer Neg Hx    Social History   Socioeconomic History  . Marital status: Divorced    Spouse name: Not on file  . Number of children: Not on file  . Years of education: Not on file  . Highest education level: Not on file  Occupational History  . Occupation: Unemployed  Social Needs  . Financial resource strain: Not on file  . Food insecurity    Worry: Not on file    Inability: Not on file  . Transportation needs    Medical: Not on file    Non-medical: Not on file  Tobacco Use  . Smoking status: Current Every Day Smoker    Packs/day: 0.25    Years: 40.00    Pack years: 10.00    Types: Cigarettes  . Smokeless tobacco: Never Used  Substance and Sexual Activity  . Alcohol use: No  . Drug use: No  . Sexual activity: Never  Lifestyle  . Physical activity    Days per week: Not on file    Minutes per session: Not on  file  . Stress: Not on file  Relationships  .  Social Herbalist on phone: Not on file    Gets together: Not on file    Attends religious service: Not on file    Active member of club or organization: Not on file    Attends meetings of clubs or organizations: Not on file    Relationship status: Not on file  . Intimate partner violence    Fear of current or ex partner: Not on file    Emotionally abused: Not on file    Physically abused: Not on file    Forced sexual activity: Not on file  Other Topics Concern  . Not on file  Social History Narrative   Lives in an ALF.  Normally independent of ADLs and with ambulation.    Review of Systems: Pertinent items are noted in HPI.  Physical Exam: Blood pressure 135/75, pulse 95, temperature 99.6 F (37.6 C), temperature source Rectal, resp. rate (!) 25, weight 61 kg, SpO2 100 %. Physical Exam Constitutional:      General: She is not in acute distress.    Appearance: Normal appearance. She is not toxic-appearing.     Comments: Thin, elderly female  Cardiovascular:     Rate and Rhythm: Normal rate and regular rhythm.     Pulses: Normal pulses.     Heart sounds: Normal heart sounds.  Pulmonary:     Effort: Pulmonary effort is normal. No respiratory distress.     Comments: Trace Crackles LLL Abdominal:     General: Bowel sounds are normal. There is no distension.     Palpations: Abdomen is soft.     Tenderness: There is no abdominal tenderness.     Comments: [place] with palpation of abdomen  Musculoskeletal:        General: No swelling or deformity.  Skin:    General: Skin is warm and dry.  Neurological:     General: No focal deficit present.     Mental Status: Mental status is at baseline.   Lab results: CBC    Component Value Date/Time   WBC 16.5 (H) 03/16/2019 0650   RBC 3.86 (L) 03/16/2019 0650   HGB 12.0 03/16/2019 0650   HCT 37.9 03/16/2019 0650   PLT 299 03/16/2019 0650   MCV 98.2 03/16/2019 0650   MCH  31.1 03/16/2019 0650   MCHC 31.7 03/16/2019 0650   RDW 12.9 03/16/2019 0650   LYMPHSABS PENDING 10/03/2018 0930   MONOABS PENDING 10/03/2018 0930   EOSABS PENDING 10/03/2018 0930   BASOSABS PENDING 10/03/2018 0930   BMP Latest Ref Rng & Units 03/16/2019 09/29/2018 09/28/2018  Glucose 70 - 99 mg/dL 135(H) 119(H) 140(H)  BUN 8 - 23 mg/dL 7(L) 12 18  Creatinine 0.44 - 1.00 mg/dL 0.96 0.83 0.99  Sodium 135 - 145 mmol/L 138 138 140  Potassium 3.5 - 5.1 mmol/L 4.3 4.1 4.3  Chloride 98 - 111 mmol/L 110 107 112(H)  CO2 22 - 32 mmol/L 21(L) 21(L) 21(L)  Calcium 8.9 - 10.3 mg/dL 10.4(H) 9.7 10.0      EKG Interpretation  Date/Time:  Sunday March 16 2019 07:43:19 EDT Ventricular Rate:  97 PR Interval:    QRS Duration: 147 QT Interval:  379 QTC Calculation: 482 R Axis:   -90 Text Interpretation:  Sinus rhythm RBBB and LAFB similar to earlier today Confirmed by Aletta Edouard [phone]) on 03/16/2019 7:55:47 AM Also confirmed by Aletta Edouard [phone]), editor Philomena Doheny [phone])  on 03/16/2019 9:41:22 AM      Imaging results:  Dg Chest Portable 1 View  Result Date: 03/16/2019 CLINICAL DATA:  Chest pressure. History of hypertension. EXAM: PORTABLE CHEST 1 VIEW COMPARISON:  X-rays dated 09/26/2018 and 06/17/2016. FINDINGS: Heart size and mediastinal contours are stable. Lungs are clear. No pleural effusion or pneumothorax seen. No acute appearing osseous abnormality. Fixation rod within the thoracolumbar spine appears stable in position. IMPRESSION: No active disease. No evidence of pneumonia or pulmonary edema. Electronically Signed   By: Franki Cabot M.D.   On: 03/16/2019 07:39   Assessment, Plan, & Recommendations by Problem:   UTI Fever: Patient noted to have U/A consistent with UTI including positive nitrites. Ur Culture pending. She is s/p Ceftriaxone in ED. No specific urinary sympomts reported, but given patient age this does not exclude UTI. Prior Ur Cx from January shows E-coli sensitive  to Cephalosporins (also bactrim, but she has sulfa allergy listed). Some non-specific inflammatory markers are elevated (CRP, LDH) and some are not (Ferritin, Triglycerides). Suspect 2/2 to UTI. Procal <0.10 and CXR clear, do not suspect pneumonia. Given cough, weakness are old and Covid test is negative, very low suspicion for COVID.  - PO antibiotics for UTI, Consider Keflex given previous susceptiblities - Zofran PRN nausea  Dyspnea Chest Wall Pain: Presented with Chest pain. EKG with stable RBBB and LAFB. hsTroponin negative x2. Lactic acid negative x2. Chest pain is reproducible on palpation. Suspect chest wall pain. Patient reports chronic dyspnea sensation and new sensation of being unable to "catch a breath" when question she states the pain is what causes this new sensation. - Recommend Voltaren Gel PRN  Deconditioning Weakness: Patient is a thin elderly female. She states she has people that help her work on her strength at her facility, but she is reportedly as a group home, which would not typically offer these services. She has some paper work for [facility] care, which is a SNF; but EDP noted paperwork from a different facility as well. Patient able to ambulate 50 feet with minimal assist. - Patient states she would like to go back to her facility if she is on the right medications - Recommend discharge to facility  Signed: Neva Seat, MD 03/16/2019, 1:52 PM

## 2019-03-17 ENCOUNTER — Telehealth (HOSPITAL_BASED_OUTPATIENT_CLINIC_OR_DEPARTMENT_OTHER): Payer: Self-pay | Admitting: *Deleted

## 2019-03-17 LAB — BLOOD CULTURE ID PANEL (REFLEXED)

## 2019-03-18 LAB — URINE CULTURE: Culture: >=100000 — AB

## 2019-03-19 ENCOUNTER — Other Ambulatory Visit: Payer: Self-pay

## 2019-03-19 ENCOUNTER — Inpatient Hospital Stay (HOSPITAL_COMMUNITY)
Admission: EM | Admit: 2019-03-19 | Discharge: 2019-03-25 | DRG: 871 | Disposition: A | Discharge status: Skilled Nursing Facility | Payer: Medicare Other | Source: Skilled Nursing Facility | Attending: Internal Medicine | Admitting: Internal Medicine

## 2019-03-19 ENCOUNTER — Emergency Department (HOSPITAL_COMMUNITY): Payer: Medicare Other

## 2019-03-19 DIAGNOSIS — G9340 Encephalopathy, unspecified: Secondary | ICD-10-CM | POA: Diagnosis present

## 2019-03-19 DIAGNOSIS — N390 Urinary tract infection, site not specified: Secondary | ICD-10-CM | POA: Diagnosis not present

## 2019-03-19 DIAGNOSIS — Z8249 Family history of ischemic heart disease and other diseases of the circulatory system: Secondary | ICD-10-CM

## 2019-03-19 DIAGNOSIS — R0602 Shortness of breath: Secondary | ICD-10-CM

## 2019-03-19 DIAGNOSIS — K5900 Constipation, unspecified: Secondary | ICD-10-CM | POA: Diagnosis not present

## 2019-03-19 DIAGNOSIS — Z96642 Presence of left artificial hip joint: Secondary | ICD-10-CM | POA: Diagnosis present

## 2019-03-19 DIAGNOSIS — A419 Sepsis, unspecified organism: Secondary | ICD-10-CM | POA: Diagnosis not present

## 2019-03-19 DIAGNOSIS — Z79899 Other long term (current) drug therapy: Secondary | ICD-10-CM

## 2019-03-19 DIAGNOSIS — R6520 Severe sepsis without septic shock: Secondary | ICD-10-CM | POA: Diagnosis present

## 2019-03-19 DIAGNOSIS — G9341 Metabolic encephalopathy: Secondary | ICD-10-CM

## 2019-03-19 DIAGNOSIS — Z20828 Contact with and (suspected) exposure to other viral communicable diseases: Secondary | ICD-10-CM | POA: Diagnosis present

## 2019-03-19 DIAGNOSIS — R001 Bradycardia, unspecified: Secondary | ICD-10-CM

## 2019-03-19 DIAGNOSIS — Z823 Family history of stroke: Secondary | ICD-10-CM

## 2019-03-19 DIAGNOSIS — Z981 Arthrodesis status: Secondary | ICD-10-CM

## 2019-03-19 DIAGNOSIS — F17210 Nicotine dependence, cigarettes, uncomplicated: Secondary | ICD-10-CM | POA: Diagnosis present

## 2019-03-19 DIAGNOSIS — Z832 Family history of diseases of the blood and blood-forming organs and certain disorders involving the immune mechanism: Secondary | ICD-10-CM

## 2019-03-19 DIAGNOSIS — I10 Essential (primary) hypertension: Secondary | ICD-10-CM | POA: Diagnosis present

## 2019-03-19 DIAGNOSIS — F419 Anxiety disorder, unspecified: Secondary | ICD-10-CM | POA: Diagnosis present

## 2019-03-19 DIAGNOSIS — F259 Schizoaffective disorder, unspecified: Secondary | ICD-10-CM | POA: Diagnosis present

## 2019-03-19 DIAGNOSIS — I452 Bifascicular block: Secondary | ICD-10-CM | POA: Diagnosis present

## 2019-03-19 DIAGNOSIS — F329 Major depressive disorder, single episode, unspecified: Secondary | ICD-10-CM | POA: Diagnosis present

## 2019-03-19 DIAGNOSIS — E860 Dehydration: Secondary | ICD-10-CM | POA: Diagnosis present

## 2019-03-19 DIAGNOSIS — G4700 Insomnia, unspecified: Secondary | ICD-10-CM | POA: Diagnosis present

## 2019-03-19 DIAGNOSIS — Z7982 Long term (current) use of aspirin: Secondary | ICD-10-CM

## 2019-03-19 LAB — CBG MONITORING, ED: Glucose-Capillary: 110 mg/dL — ABNORMAL HIGH (ref 70–99)

## 2019-03-19 LAB — COMPREHENSIVE METABOLIC PANEL
ALT: 16 U/L (ref 0–44)
AST: 14 U/L — ABNORMAL LOW (ref 15–41)
Albumin: 3.6 g/dL (ref 3.5–5.0)
Alkaline Phosphatase: 111 U/L (ref 38–126)
Anion gap: 9 (ref 5–15)
BUN: 13 mg/dL (ref 8–23)
CO2: 21 mmol/L — ABNORMAL LOW (ref 22–32)
Calcium: 10.7 mg/dL — ABNORMAL HIGH (ref 8.9–10.3)
Chloride: 110 mmol/L (ref 98–111)
Creatinine, Ser: 0.69 mg/dL (ref 0.44–1.00)
GFR calc Af Amer: >60 mL/min (ref >60)
GFR calc non Af Amer: >60 mL/min (ref >60)
Glucose, Bld: 118 mg/dL — ABNORMAL HIGH (ref 70–99)
Potassium: 4.1 mmol/L (ref 3.5–5.1)
Sodium: 140 mmol/L (ref 135–145)
Total Bilirubin: 0.3 mg/dL (ref 0.3–1.2)
Total Protein: 7.8 g/dL (ref 6.5–8.1)

## 2019-03-19 LAB — URINALYSIS, ROUTINE W REFLEX MICROSCOPIC
Bacteria, UA: NONE SEEN
Bilirubin Urine: NEGATIVE
Glucose, UA: NEGATIVE mg/dL
Hgb urine dipstick: NEGATIVE
Ketones, ur: NEGATIVE mg/dL
Nitrite: NEGATIVE
Protein, ur: NEGATIVE mg/dL
Specific Gravity, Urine: 1.011 (ref 1.005–1.030)
pH: 7 (ref 5.0–8.0)

## 2019-03-19 LAB — CBC WITH DIFFERENTIAL/PLATELET
Abs Immature Granulocytes: 0.06 10*3/uL (ref 0.00–0.07)
Basophils Absolute: 0.1 10*3/uL (ref 0.0–0.1)
Basophils Relative: 1 %
Eosinophils Absolute: 0.1 10*3/uL (ref 0.0–0.5)
Eosinophils Relative: 1 %
HCT: 37.6 % (ref 36.0–46.0)
Hemoglobin: 11.7 g/dL — ABNORMAL LOW (ref 12.0–15.0)
Immature Granulocytes: 1 %
Lymphocytes Relative: 17 %
Lymphs Abs: 2 10*3/uL (ref 0.7–4.0)
MCH: 30.9 pg (ref 26.0–34.0)
MCHC: 31.1 g/dL (ref 30.0–36.0)
MCV: 99.2 fL (ref 80.0–100.0)
Monocytes Absolute: 0.7 10*3/uL (ref 0.1–1.0)
Monocytes Relative: 6 %
Neutro Abs: 9.1 10*3/uL — ABNORMAL HIGH (ref 1.7–7.7)
Neutrophils Relative %: 74 %
Platelets: 337 10*3/uL (ref 150–400)
RBC: 3.79 MIL/uL — ABNORMAL LOW (ref 3.87–5.11)
RDW: 12.6 % (ref 11.5–15.5)
WBC: 12.1 10*3/uL — ABNORMAL HIGH (ref 4.0–10.5)
nRBC: 0 % (ref 0.0–0.2)

## 2019-03-19 LAB — CULTURE, BLOOD (ROUTINE X 2): Special Requests: ADEQUATE

## 2019-03-19 LAB — PROTIME-INR
INR: 1.2 (ref 0.8–1.2)
Prothrombin Time: 15 seconds (ref 11.4–15.2)

## 2019-03-19 LAB — LACTIC ACID, PLASMA: Lactic Acid, Venous: 0.8 mmol/L (ref 0.5–1.9)

## 2019-03-19 LAB — APTT: aPTT: 38 seconds — ABNORMAL HIGH (ref 24–36)

## 2019-03-19 MED ORDER — IOHEXOL 300 MG/ML  SOLN
100.0000 mL | Freq: Once | INTRAMUSCULAR | Status: AC | PRN
  Administered 2019-03-19: 100 mL via INTRAVENOUS

## 2019-03-19 MED ORDER — LACTATED RINGERS IV BOLUS
1000.0000 mL | Freq: Once | INTRAVENOUS | Status: AC
  Administered 2019-03-19: 1000 mL via INTRAVENOUS

## 2019-03-19 MED ORDER — LORAZEPAM 2 MG/ML IJ SOLN
1.0000 mg | Freq: Once | INTRAMUSCULAR | Status: AC
  Administered 2019-03-19: 1 mg via INTRAVENOUS
  Filled 2019-03-19: qty 1

## 2019-03-19 MED ORDER — SODIUM CHLORIDE 0.9 % IV SOLN
1.0000 g | INTRAVENOUS | Status: DC
  Administered 2019-03-19 – 2019-03-22 (×4): 1 g via INTRAVENOUS
  Filled 2019-03-19: qty 1
  Filled 2019-03-19: qty 10
  Filled 2019-03-19 (×2): qty 1

## 2019-03-19 MED ORDER — SODIUM CHLORIDE 0.9% FLUSH
3.0000 mL | Freq: Once | INTRAVENOUS | Status: AC
  Administered 2019-03-19: 22:00:00 3 mL via INTRAVENOUS

## 2019-03-19 NOTE — ED Notes (Signed)
EKG given to EDP,[place],.MD., for review. 

## 2019-03-19 NOTE — Telephone Encounter (Signed)
Post ED Visit - Positive Culture Follow-up  Culture report reviewed by antimicrobial stewardship pharmacist: [place] Team []  Elenor Quinones, Pharm.D. []  Heide Guile, Pharm.D., BCPS AQ-ID []  Parks Neptune, Pharm.D., BCPS []  Alycia Rossetti, Pharm.D., BCPS []  [place], Pharm.D., BCPS, AAHIVP []  Legrand Como, Pharm.D., BCPS, AAHIVP []  Salome Arnt, PharmD, BCPS []  Johnnette Gourd, PharmD, BCPS []  Hughes Better, PharmD, BCPS []  Leeroy Cha, PharmD []  Laqueta Linden, PharmD, BCPS []  Albertina Parr, PharmD Elicia Lamp PharmD  [place] Team []  Leodis Sias, PharmD []  Lindell Spar, PharmD []  Royetta Asal, PharmD []  Graylin Shiver, Rph []  Rema Fendt) Glennon Mac, PharmD []  Arlyn Dunning, PharmD []  Netta Cedars, PharmD []  Dia Sitter, PharmD []  Leone Haven, PharmD []  Gretta Arab, PharmD []  Theodis Shove, PharmD []  Peggyann Juba, PharmD []  Reuel Boom, PharmD   Positive urine culture Treated with cephalexin, organism sensitive to the same and no further patient follow-up is required at this time.  Hazle Nordmann 03/19/2019, 12:09 PM

## 2019-03-19 NOTE — ED Provider Notes (Signed)
[facility] DEPT Provider Note   CSN: [id number] Arrival date & time: 03/19/19  2106   LEVEL 5 CAVEAT - ALTERED MENTAL STATUS   History   Chief Complaint Chief Complaint  Patient presents with  . Recent UTI    HPI Heidi Blevins is a 68 y.o. female.     HPI  68 year old female presents from the nursing facility with altered mental status.  History is limited and only obtained from EMS as the patient is altered.  Was in the [facility] emergency department on 7/19 and diagnosed with UTI and sent to her facility.  The patient apparently has had progressively worsening mental status and given how worse it was tonight she was sent in.  Unclear how many antibiotic doses she has had.  No reported fevers.  Past Medical History:  Diagnosis Date  . Anxiety   . Arrhythmia   . Bifascicular block   . Depression   . Hypertension   . Insomnia   . Schizoaffective disorder   . Scoliosis   . Vitamin D deficiency     Patient Active Problem List   Diagnosis Date Noted  . Closed displaced fracture of left femoral neck ([place]) 09/26/2018  . AKI (acute kidney injury) ([place]) 06/13/2016  . UTI (urinary tract infection) 06/13/2016  . Essential hypertension 06/13/2016  . Urinary tract infection without hematuria   . Complicated UTI (urinary tract infection) 05/03/2016  . Hypercalcemia 05/03/2016  . Dehydration 05/03/2016  . Acute encephalopathy 05/03/2016  . 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 Schizoaffective disorder ([place]) 05/03/2016  . Bifascicular block 05/03/2016    Past Surgical History:  Procedure Laterality Date  . BACK SURGERY     "as a child after she fell out of a tree"  . TONSILLECTOMY    . 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      Home  Medications    Prior to Admission medications   Medication Sig Start Date End Date Taking? Authorizing Provider  acetaminophen (TYLENOL) 500 MG tablet Take 500 mg by mouth See admin instructions. Take 500 mg by mouth at 9 AM every morning and 650 mg every 4 hours as needed for pain/fever/headaches   Yes [provider]  aspirin EC 81 MG EC tablet Take 1 tablet (81 mg total) by mouth daily. 05/06/16  Yes Patrecia Pour, Christean Grief, MD  benztropine (COGENTIN) 1 MG tablet Take 1 mg by mouth 2 (two) times daily.   Yes [provider]  cephALEXin (KEFLEX) 250 MG capsule Take 1 capsule (250 mg total) by mouth 4 (four) times daily. 03/16/19  Yes Hayden Rasmussen, MD  Cranberry 425 MG CAPS Take 425 mg by mouth 2 (two) times a day.   Yes [provider]  ferrous [id number] C-folic acid (FEROCON) capsule Take 1 capsule by mouth at bedtime.   Yes [provider]  fluPHENAZine (PROLIXIN) 10 MG tablet Take 20 mg by mouth daily.    Yes [provider]  LATUDA 120 MG TABS Take 120 mg by mouth daily. 09/11/18  Yes [provider]  lisinopril (PRINIVIL,ZESTRIL) 5 MG tablet Take 5 mg by mouth daily. Hold if Systolic b/p <998 3/38/25  Yes [provider]  lithium carbonate 150 MG capsule Take 150-300 mg by mouth See  admin instructions. Take 300 mg by mouth in the morning and 150 mg at bedtime 150mg  at 8pm 09/11/18  Yes [provider]  loratadine (CLARITIN) 10 MG tablet Take 10 mg by mouth daily.   Yes [provider]  LORazepam (ATIVAN) 0.5 MG tablet Take 1 tablet (0.5 mg total) by mouth 4 (four) times daily. 10/03/18  Yes Oretha Milch D, MD  Melatonin 5 MG TABS Take 5 mg by mouth at bedtime.   Yes [provider]  metoprolol tartrate (LOPRESSOR) 25 MG tablet Take 1 tablet (25 mg total) by mouth 2 (two) times daily. Patient taking differently: Take 12.5 mg by mouth 2 (two) times daily.  05/06/16  Yes Patrecia Pour, Christean Grief, MD   polyethylene glycol powder (GAVILAX) 17 GM/SCOOP powder Take 17 g by mouth daily. MIX INTO 8 OUNCES OF FLUID, MIX, AND DRINK   Yes [provider]  Pramoxine HCl (VAGISIL ANTI-ITCH MEDICATED EX) Apply 1 application topically daily as needed (for vaginal itching). Apply vaginally once a day   Yes [provider]  Propylene Glycol (SYSTANE BALANCE) 0.6 % SOLN Place 1 drop into both eyes 3 (three) times daily.    Yes [provider]  risperiDONE microspheres (RISPERDAL CONSTA) 50 MG injection Inject 50 mg into the muscle every 14 (fourteen) days.   Yes [provider]  sertraline (ZOLOFT) 100 MG tablet Take 100 mg by mouth daily.    Yes [provider]  ENSURE (ENSURE) Take 237 mLs by mouth 2 (two) times daily. Strawberry    [provider]  HYDROcodone-acetaminophen (NORCO) 7.5-325 MG tablet Take 1-2 tablets by mouth every 6 (six) hours as needed for moderate pain. Patient not taking: Reported on 03/16/2019 09/27/18   Leandrew Koyanagi, MD  methocarbamol (ROBAXIN) 500 MG tablet Take 1 tablet (500 mg total) by mouth every 6 (six) hours as needed for muscle spasms. Patient not taking: Reported on 03/19/2019 10/03/18   Desiree Hane, MD  Nutritional Supplements (NUTRITIONAL SHAKE PLUS PO) Take 1 Container by mouth 3 (three) times daily.    [provider]    Family History Family History  Problem Relation Age of Onset  . CVA Mother   . Hypertension Mother   . Lupus Sister   . Hypertension Brother   . Peptic Ulcer Disease Father   . Colon cancer Neg Hx     Social History Social History   Tobacco Use  . Smoking status: Current Every Day Smoker    Packs/day: 0.25    Years: 40.00    Pack years: 10.00    Types: Cigarettes  . Smokeless tobacco: Never Used  Substance Use Topics  . Alcohol use: No  . Drug use: No     Allergies   Sulfonamide derivatives   Review of Systems Review of Systems  Unable to perform ROS: Mental status  change     Physical Exam Updated Vital Signs BP (!) 189/83 (BP Location: Left Arm)   Pulse 90   Temp 99 F (37.2 C) (Rectal)   Resp (!) 30   SpO2 99%   Physical Exam Vitals signs and nursing note reviewed.  Constitutional:      Appearance: She is well-developed. She is not diaphoretic.  HENT:     Head: Normocephalic and atraumatic.     Right Ear: External ear normal.     Left Ear: External ear normal.     Nose: Nose normal.  Eyes:     General:  Right eye: No discharge.        Left eye: No discharge.  Cardiovascular:     Rate and Rhythm: Normal rate and regular rhythm.     Heart sounds: Normal heart sounds.  Pulmonary:     Effort: Pulmonary effort is normal.     Breath sounds: Normal breath sounds.  Abdominal:     Palpations: Abdomen is soft.     Tenderness: There is abdominal tenderness.  Skin:    General: Skin is warm and dry.  Neurological:     Mental Status: She is lethargic and disoriented.  Psychiatric:        Mood and Affect: Mood is not anxious.      ED Treatments / Results  Labs (all labs ordered are listed, but only abnormal results are displayed) Labs Reviewed  COMPREHENSIVE METABOLIC PANEL - Abnormal; Notable for the following components:      Result Value   CO2 21 (*)    Glucose, Bld 118 (*)    Calcium 10.7 (*)    AST 14 (*)    All other components within normal limits  CBC WITH DIFFERENTIAL/PLATELET - Abnormal; Notable for the following components:   WBC 12.1 (*)    RBC 3.79 (*)    Hemoglobin 11.7 (*)    Neutro Abs 9.1 (*)    All other components within normal limits  APTT - Abnormal; Notable for the following components:   aPTT 38 (*)    All other components within normal limits  URINALYSIS, ROUTINE W REFLEX MICROSCOPIC - Abnormal; Notable for the following components:   Leukocytes,Ua TRACE (*)    All other components within normal limits  CBG MONITORING, ED - Abnormal; Notable for the following components:   Glucose-Capillary  110 (*)    All other components within normal limits  CULTURE, BLOOD (ROUTINE X 2)  CULTURE, BLOOD (ROUTINE X 2)  URINE CULTURE  SARS CORONAVIRUS 2 (HOSPITAL ORDER, [facility])  LACTIC ACID, PLASMA  PROTIME-INR  LACTIC ACID, PLASMA    EKG EKG Interpretation  Date/Time:  Wednesday March 19 2019 21:26:00 EDT Ventricular Rate:  82 PR Interval:    QRS Duration: 135 QT Interval:  407 QTC Calculation: 476 R Axis:   -70 Text Interpretation:  Sinus rhythm Consider left atrial enlargement RBBB and LAFB no significant change since March 16 2019 Confirmed by Sherwood Gambler [phone]) on 03/19/2019 9:38:59 PM   Radiology Dg Chest Port 1 View  Result Date: 03/19/2019 CLINICAL DATA:  Sepsis.  Altered mental status. EXAM: PORTABLE CHEST 1 VIEW COMPARISON:  Chest x-ray dated March 16, 2019. FINDINGS: The heart size and mediastinal contours are within normal limits. Normal pulmonary vascularity. No focal consolidation, pleural effusion, or pneumothorax. No acute osseous abnormality. Unchanged thoracolumbar scoliosis status post Harrington rod fixation. IMPRESSION: No active disease. Electronically Signed   By: Titus Dubin M.D.   On: 03/19/2019 21:39    Procedures Procedures (including critical care time)  Medications Ordered in ED Medications  cefTRIAXone (ROCEPHIN) 1 g in sodium chloride 0.9 % 100 mL IVPB (1 g Intravenous New Bag/Given 03/19/19 2149)  sodium chloride flush (NS) 0.9 % injection 3 mL (3 mLs Intravenous Given 03/19/19 2148)  lactated ringers bolus 1,000 mL (1,000 mLs Intravenous New Bag/Given 03/19/19 2208)     Initial Impression / Assessment and Plan / ED Course  I have reviewed the triage vital signs and the nursing notes.  Pertinent labs & imaging results that were available during my  care of the patient were reviewed by me and considered in my medical decision making (see chart for details).        Patient is altered but protecting airway.   Repeats things when asked but is not really following commands.  She is fairly tender diffusely with some guarding so a CT of her abdomen and pelvis will be obtained.  Given the mental status change CT head will be obtained.  Labs are overall fairly similar to the other day but given the new altered mental status she will need admission.  Care transferred to Dr. Roxanne Mins with CT pending.  Heidi Blevins was evaluated in Emergency Department on 03/19/2019 for the symptoms described in the history of present illness. She was evaluated in the context of the global COVID-19 pandemic, which necessitated consideration that the patient might be at risk for infection with the SARS-CoV-2 virus that causes COVID-19. Institutional protocols and algorithms that pertain to the evaluation of patients at risk for COVID-19 are in a state of rapid change based on information released by regulatory bodies including the CDC and federal and state organizations. These policies and algorithms were followed during the patient's care in the ED.   Final Clinical Impressions(s) / ED Diagnoses   Final diagnoses:  None    ED Discharge Orders    None       Sherwood Gambler, MD 03/19/19 2304

## 2019-03-19 NOTE — ED Notes (Addendum)
Pt. Documented in error CT Abdomen Pelvis W/Contrast.

## 2019-03-19 NOTE — ED Notes (Signed)
Pt. CBG 110, RN, Lovena Le made aware.

## 2019-03-19 NOTE — ED Triage Notes (Signed)
Patient arrives by [facility] from [facility] [place]-called out for altered mental status. EMS states this has been ongoing since discharge from [facility] on Keflex for UTI-weakness,lethargic-normally goes out to smoke and is alert and oriented.

## 2019-03-19 NOTE — ED Notes (Signed)
Patient transported to CT 

## 2019-03-19 NOTE — ED Notes (Signed)
Patient is agitated and restless-trying to pull out IV-taking off pulse ox-patient taking spit out of her mouth and rubbing it between fingers and on hands-Dr. Regenia Skeeter made aware and Ativan ordered and administered

## 2019-03-20 ENCOUNTER — Telehealth: Payer: Self-pay

## 2019-03-20 ENCOUNTER — Inpatient Hospital Stay (HOSPITAL_COMMUNITY): Payer: Medicare Other

## 2019-03-20 ENCOUNTER — Encounter (HOSPITAL_COMMUNITY): Payer: Self-pay | Admitting: Internal Medicine

## 2019-03-20 DIAGNOSIS — I10 Essential (primary) hypertension: Secondary | ICD-10-CM | POA: Diagnosis present

## 2019-03-20 DIAGNOSIS — F419 Anxiety disorder, unspecified: Secondary | ICD-10-CM | POA: Diagnosis present

## 2019-03-20 DIAGNOSIS — K5900 Constipation, unspecified: Secondary | ICD-10-CM | POA: Diagnosis not present

## 2019-03-20 DIAGNOSIS — E860 Dehydration: Secondary | ICD-10-CM | POA: Diagnosis present

## 2019-03-20 DIAGNOSIS — Z96642 Presence of left artificial hip joint: Secondary | ICD-10-CM | POA: Diagnosis present

## 2019-03-20 DIAGNOSIS — R001 Bradycardia, unspecified: Secondary | ICD-10-CM | POA: Diagnosis present

## 2019-03-20 DIAGNOSIS — G4700 Insomnia, unspecified: Secondary | ICD-10-CM | POA: Diagnosis present

## 2019-03-20 DIAGNOSIS — A419 Sepsis, unspecified organism: Principal | ICD-10-CM

## 2019-03-20 DIAGNOSIS — F258 Other schizoaffective disorders: Secondary | ICD-10-CM

## 2019-03-20 DIAGNOSIS — F17210 Nicotine dependence, cigarettes, uncomplicated: Secondary | ICD-10-CM | POA: Diagnosis present

## 2019-03-20 DIAGNOSIS — I452 Bifascicular block: Secondary | ICD-10-CM | POA: Diagnosis present

## 2019-03-20 DIAGNOSIS — N390 Urinary tract infection, site not specified: Secondary | ICD-10-CM

## 2019-03-20 DIAGNOSIS — Z7982 Long term (current) use of aspirin: Secondary | ICD-10-CM | POA: Diagnosis not present

## 2019-03-20 DIAGNOSIS — Z79899 Other long term (current) drug therapy: Secondary | ICD-10-CM | POA: Diagnosis not present

## 2019-03-20 DIAGNOSIS — Z823 Family history of stroke: Secondary | ICD-10-CM | POA: Diagnosis not present

## 2019-03-20 DIAGNOSIS — F259 Schizoaffective disorder, unspecified: Secondary | ICD-10-CM | POA: Diagnosis present

## 2019-03-20 DIAGNOSIS — G9341 Metabolic encephalopathy: Secondary | ICD-10-CM | POA: Diagnosis present

## 2019-03-20 DIAGNOSIS — R6520 Severe sepsis without septic shock: Secondary | ICD-10-CM | POA: Diagnosis present

## 2019-03-20 DIAGNOSIS — Z8249 Family history of ischemic heart disease and other diseases of the circulatory system: Secondary | ICD-10-CM | POA: Diagnosis not present

## 2019-03-20 DIAGNOSIS — R9431 Abnormal electrocardiogram [ECG] [EKG]: Secondary | ICD-10-CM | POA: Diagnosis not present

## 2019-03-20 DIAGNOSIS — Z20828 Contact with and (suspected) exposure to other viral communicable diseases: Secondary | ICD-10-CM | POA: Diagnosis present

## 2019-03-20 DIAGNOSIS — Z981 Arthrodesis status: Secondary | ICD-10-CM | POA: Diagnosis not present

## 2019-03-20 DIAGNOSIS — F329 Major depressive disorder, single episode, unspecified: Secondary | ICD-10-CM | POA: Diagnosis present

## 2019-03-20 DIAGNOSIS — G9340 Encephalopathy, unspecified: Secondary | ICD-10-CM

## 2019-03-20 DIAGNOSIS — Z832 Family history of diseases of the blood and blood-forming organs and certain disorders involving the immune mechanism: Secondary | ICD-10-CM | POA: Diagnosis not present

## 2019-03-20 LAB — CBC WITH DIFFERENTIAL/PLATELET
Abs Immature Granulocytes: 0.04 10*3/uL (ref 0.00–0.07)
Basophils Absolute: 0.1 10*3/uL (ref 0.0–0.1)
Basophils Relative: 1 %
Eosinophils Absolute: 0.2 10*3/uL (ref 0.0–0.5)
Eosinophils Relative: 1 %
HCT: 36 % (ref 36.0–46.0)
Hemoglobin: 11.1 g/dL — ABNORMAL LOW (ref 12.0–15.0)
Immature Granulocytes: 0 %
Lymphocytes Relative: 16 %
Lymphs Abs: 2 10*3/uL (ref 0.7–4.0)
MCH: 31.2 pg (ref 26.0–34.0)
MCHC: 30.8 g/dL (ref 30.0–36.0)
MCV: 101.1 fL — ABNORMAL HIGH (ref 80.0–100.0)
Monocytes Absolute: 0.9 10*3/uL (ref 0.1–1.0)
Monocytes Relative: 7 %
Neutro Abs: 9.6 10*3/uL — ABNORMAL HIGH (ref 1.7–7.7)
Neutrophils Relative %: 75 %
Platelets: 279 10*3/uL (ref 150–400)
RBC: 3.56 MIL/uL — ABNORMAL LOW (ref 3.87–5.11)
RDW: 12.6 % (ref 11.5–15.5)
WBC: 12.8 10*3/uL — ABNORMAL HIGH (ref 4.0–10.5)
nRBC: 0 % (ref 0.0–0.2)

## 2019-03-20 LAB — MAGNESIUM: Magnesium: 2.3 mg/dL (ref 1.7–2.4)

## 2019-03-20 LAB — BASIC METABOLIC PANEL
Anion gap: 11 (ref 5–15)
BUN: 11 mg/dL (ref 8–23)
CO2: 18 mmol/L — ABNORMAL LOW (ref 22–32)
Calcium: 10.3 mg/dL (ref 8.9–10.3)
Chloride: 109 mmol/L (ref 98–111)
Creatinine, Ser: 0.67 mg/dL (ref 0.44–1.00)
GFR calc Af Amer: >60 mL/min (ref >60)
GFR calc non Af Amer: >60 mL/min (ref >60)
Glucose, Bld: 93 mg/dL (ref 70–99)
Potassium: 3.7 mmol/L (ref 3.5–5.1)
Sodium: 138 mmol/L (ref 135–145)

## 2019-03-20 LAB — URINE CULTURE: Culture: NO GROWTH

## 2019-03-20 LAB — SARS CORONAVIRUS 2 BY RT PCR (HOSPITAL ORDER, PERFORMED IN ~~LOC~~ HOSPITAL LAB): SARS Coronavirus 2: NEGATIVE

## 2019-03-20 LAB — ECHOCARDIOGRAM COMPLETE

## 2019-03-20 LAB — PHOSPHORUS: Phosphorus: 3 mg/dL (ref 2.5–4.6)

## 2019-03-20 MED ORDER — SODIUM CHLORIDE 0.9 % IV SOLN
INTRAVENOUS | Status: DC
  Administered 2019-03-20 – 2019-03-22 (×3): via INTRAVENOUS

## 2019-03-20 MED ORDER — ASPIRIN EC 81 MG PO TBEC
81.0000 mg | DELAYED_RELEASE_TABLET | Freq: Every day | ORAL | Status: DC
  Administered 2019-03-20 – 2019-03-25 (×6): 81 mg via ORAL
  Filled 2019-03-20 (×6): qty 1

## 2019-03-20 MED ORDER — BENZTROPINE MESYLATE 1 MG PO TABS
1.0000 mg | ORAL_TABLET | Freq: Two times a day (BID) | ORAL | Status: DC
  Administered 2019-03-20 – 2019-03-25 (×11): 1 mg via ORAL
  Filled 2019-03-20 (×12): qty 1

## 2019-03-20 MED ORDER — ENOXAPARIN SODIUM 40 MG/0.4ML ~~LOC~~ SOLN
40.0000 mg | Freq: Every day | SUBCUTANEOUS | Status: DC
  Administered 2019-03-20 – 2019-03-25 (×6): 40 mg via SUBCUTANEOUS
  Filled 2019-03-20 (×7): qty 0.4

## 2019-03-20 MED ORDER — FE FUMARATE-B12-VIT C-FA-IFC PO CAPS
1.0000 | ORAL_CAPSULE | Freq: Every day | ORAL | Status: DC
  Administered 2019-03-20 – 2019-03-24 (×5): 1 via ORAL
  Filled 2019-03-20 (×7): qty 1

## 2019-03-20 MED ORDER — SERTRALINE HCL 100 MG PO TABS
100.0000 mg | ORAL_TABLET | Freq: Every day | ORAL | Status: DC
  Administered 2019-03-20 – 2019-03-25 (×6): 100 mg via ORAL
  Filled 2019-03-20: qty 1
  Filled 2019-03-20: qty 2
  Filled 2019-03-20 (×4): qty 1

## 2019-03-20 MED ORDER — SODIUM CHLORIDE 0.9 % IV BOLUS (SEPSIS)
1000.0000 mL | Freq: Once | INTRAVENOUS | Status: AC
  Administered 2019-03-20: 1000 mL via INTRAVENOUS

## 2019-03-20 MED ORDER — METOPROLOL TARTRATE 12.5 MG HALF TABLET
12.5000 mg | ORAL_TABLET | Freq: Two times a day (BID) | ORAL | Status: DC
  Administered 2019-03-20 (×2): 12.5 mg via ORAL
  Filled 2019-03-20 (×2): qty 1

## 2019-03-20 MED ORDER — LITHIUM CARBONATE 150 MG PO CAPS
150.0000 mg | ORAL_CAPSULE | Freq: Every day | ORAL | Status: DC
  Administered 2019-03-20 – 2019-03-24 (×5): 150 mg via ORAL
  Filled 2019-03-20 (×7): qty 1

## 2019-03-20 MED ORDER — ACETAMINOPHEN 325 MG PO TABS
650.0000 mg | ORAL_TABLET | Freq: Four times a day (QID) | ORAL | Status: DC | PRN
  Administered 2019-03-20 – 2019-03-25 (×7): 650 mg via ORAL
  Filled 2019-03-20 (×8): qty 2

## 2019-03-20 MED ORDER — ACETAMINOPHEN 650 MG RE SUPP: 650.0000 mg | Freq: Four times a day (QID) | RECTAL | Status: DC | PRN

## 2019-03-20 MED ORDER — LORAZEPAM 0.5 MG PO TABS
0.5000 mg | ORAL_TABLET | Freq: Four times a day (QID) | ORAL | Status: DC
  Administered 2019-03-20 – 2019-03-23 (×16): 0.5 mg via ORAL
  Filled 2019-03-20 (×17): qty 1

## 2019-03-20 MED ORDER — LITHIUM CARBONATE 300 MG PO CAPS
300.0000 mg | ORAL_CAPSULE | Freq: Every day | ORAL | Status: DC
  Administered 2019-03-20 – 2019-03-25 (×6): 300 mg via ORAL
  Filled 2019-03-20 (×6): qty 1

## 2019-03-20 MED ORDER — LISINOPRIL 5 MG PO TABS
5.0000 mg | ORAL_TABLET | Freq: Every day | ORAL | Status: DC
  Administered 2019-03-20: 10:00:00 5 mg via ORAL
  Filled 2019-03-20: qty 1

## 2019-03-20 MED ORDER — POLYETHYLENE GLYCOL 3350 17 G PO PACK
17.0000 g | PACK | Freq: Every day | ORAL | Status: DC
  Administered 2019-03-20 – 2019-03-25 (×6): 17 g via ORAL
  Filled 2019-03-20 (×6): qty 1

## 2019-03-20 MED ORDER — LORATADINE 10 MG PO TABS
10.0000 mg | ORAL_TABLET | Freq: Every day | ORAL | Status: DC
  Administered 2019-03-20 – 2019-03-25 (×6): 10 mg via ORAL
  Filled 2019-03-20 (×6): qty 1

## 2019-03-20 MED ORDER — POLYVINYL ALCOHOL 1.4 % OP SOLN
1.0000 [drp] | Freq: Three times a day (TID) | OPHTHALMIC | Status: DC
  Administered 2019-03-20 – 2019-03-25 (×14): 1 [drp] via OPHTHALMIC
  Filled 2019-03-20: qty 15

## 2019-03-20 MED ORDER — RISPERIDONE MICROSPHERES ER 50 MG IM SRER
50.0000 mg | INTRAMUSCULAR | Status: DC
  Filled 2019-03-20: qty 2

## 2019-03-20 MED ORDER — LURASIDONE HCL 40 MG PO TABS
120.0000 mg | ORAL_TABLET | Freq: Every day | ORAL | Status: DC
  Administered 2019-03-20 – 2019-03-25 (×6): 120 mg via ORAL
  Filled 2019-03-20 (×6): qty 3

## 2019-03-20 MED ORDER — FLUPHENAZINE HCL 5 MG PO TABS
20.0000 mg | ORAL_TABLET | Freq: Every day | ORAL | Status: DC
  Administered 2019-03-20 – 2019-03-25 (×6): 20 mg via ORAL
  Filled 2019-03-20 (×6): qty 4

## 2019-03-20 MED ORDER — MELATONIN 5 MG PO TABS
5.0000 mg | ORAL_TABLET | Freq: Every day | ORAL | Status: DC
  Administered 2019-03-20 – 2019-03-24 (×5): 5 mg via ORAL
  Filled 2019-03-20 (×7): qty 1

## 2019-03-20 NOTE — H&P (Signed)
History and Physical    CANDEE HOON VQM:[id number] DOB: December 05, 1950 DOA: 03/19/2019  PCP: [name], MD   Patient coming from: NH  I have personally briefly reviewed patient's old medical records in [place]  Chief Complaint: AMS.  HPI: Heidi Blevins is a 68 y.o. female with medical history significant of anxiety, arrhythmia, bifascicular block, depression, hypertension, insomnia schizoaffective disorder, scoliosis, vitamin D deficiency who is brought to the emergency department from her facility due to [place].  The patient has had worsening mental status since she was discharged from [facility] and started on Keflex for UTI.  She has been weak and lethargic.  EMS stated the patient normally goes out to smoke, is usually alert and oriented.  She is unable to answer questions at this time and provide further history.  ED Course: Initial vital signs temperature 99 F, pulse 81, respirations 30, blood pressure 166/98 mmHg and O2 sat 97% on room air.  Patient was given a 1000 mL of LR bolus, Ativan 1 mg IVP and a gram of Rocephin IVPB.  Her urinalysis showed trace leukocyte esterase, otherwise was normal white count was 12.1, hemoglobin 11.7 g/dL and platelets 337.  PT was 15.0, INR 1.2 and APTT 38 seconds.  CMP shows a CO2 21 mmol/L.  Glucose of 118 and calcium of 10.7 mg/dL.  All other values are unremarkable.  Her lactic acid was normal.  Blood cultures x2 were drawn.  Urine culture was sent to the lab.  Imaging: CT head did not show any acute intracranial pathology.  CT abdomen/pelvis showed distended urinary bladder with mild wall thickening suspicious for cystitis.  There is heterogeneous enhancement of both kidneys which could represent pyelonephritis, however detailed evaluation is limited by streak active 5 from a spinal fusion hardware.  There is a small to moderate pericardial effusion.  Nodularity of the left lobe of liver.  Please see images and full radiology report for  further details.  Review of Systems: As per HPI otherwise 10 point review of systems negative.   Past Medical History:  Diagnosis Date   Anxiety    Arrhythmia    Bifascicular block    Depression    Hypertension    Insomnia    Schizoaffective disorder    Scoliosis    Vitamin D deficiency     Past Surgical History:  Procedure Laterality Date   BACK SURGERY     "as a child after she fell out of a tree"   TONSILLECTOMY     TOTAL HIP ARTHROPLASTY Left 09/27/2018   Procedure: LEFT TOTAL HIP ARTHROPLASTY ANTERIOR APPROACH;  Surgeon: Leandrew Koyanagi, MD;  Location: [facility];  Service: Orthopedics;  Laterality: Left;     reports that she has been smoking cigarettes. She has a 10.00 pack-year smoking history. She has never used smokeless tobacco. She reports that she does not drink alcohol or use drugs.  Allergies  Allergen Reactions   Sulfonamide Derivatives Hives         Family History  Problem Relation Age of Onset   CVA Mother    Hypertension Mother    Lupus Sister    Hypertension Brother    Peptic Ulcer Disease Father    Colon cancer Neg Hx    Prior to Admission medications   Medication Sig Start Date End Date Taking? Authorizing Provider  acetaminophen (TYLENOL) 500 MG tablet Take 500 mg by mouth See admin instructions. Take 500 mg by mouth at 9 AM every morning and  650 mg every 4 hours as needed for pain/fever/headaches   Yes [provider]  aspirin EC 81 MG EC tablet Take 1 tablet (81 mg total) by mouth daily. 05/06/16  Yes Patrecia Pour, Christean Grief, MD  benztropine (COGENTIN) 1 MG tablet Take 1 mg by mouth 2 (two) times daily.   Yes [provider]  cephALEXin (KEFLEX) 250 MG capsule Take 1 capsule (250 mg total) by mouth 4 (four) times daily. 03/16/19  Yes Hayden Rasmussen, MD  Cranberry 425 MG CAPS Take 425 mg by mouth 2 (two) times a day.   Yes [provider]  ferrous [id number] C-folic acid (FEROCON) capsule Take 1  capsule by mouth at bedtime.   Yes [provider]  fluPHENAZine (PROLIXIN) 10 MG tablet Take 20 mg by mouth daily.    Yes [provider]  LATUDA 120 MG TABS Take 120 mg by mouth daily. 09/11/18  Yes [provider]  lisinopril (PRINIVIL,ZESTRIL) 5 MG tablet Take 5 mg by mouth daily. Hold if Systolic b/p <798 05/18/18  Yes [provider]  lithium carbonate 150 MG capsule Take 150-300 mg by mouth See admin instructions. Take 300 mg by mouth in the morning and 150 mg at bedtime 150mg  at 8pm 09/11/18  Yes [provider]  loratadine (CLARITIN) 10 MG tablet Take 10 mg by mouth daily.   Yes [provider]  LORazepam (ATIVAN) 0.5 MG tablet Take 1 tablet (0.5 mg total) by mouth 4 (four) times daily. 10/03/18  Yes Oretha Milch D, MD  Melatonin 5 MG TABS Take 5 mg by mouth at bedtime.   Yes [provider]  metoprolol tartrate (LOPRESSOR) 25 MG tablet Take 1 tablet (25 mg total) by mouth 2 (two) times daily. Patient taking differently: Take 12.5 mg by mouth 2 (two) times daily.  05/06/16  Yes Patrecia Pour, Christean Grief, MD  polyethylene glycol powder (GAVILAX) 17 GM/SCOOP powder Take 17 g by mouth daily. MIX INTO 8 OUNCES OF FLUID, MIX, AND DRINK   Yes [provider]  Pramoxine HCl (VAGISIL ANTI-ITCH MEDICATED EX) Apply 1 application topically daily as needed (for vaginal itching). Apply vaginally once a day   Yes [provider]  Propylene Glycol (SYSTANE BALANCE) 0.6 % SOLN Place 1 drop into both eyes 3 (three) times daily.    Yes [provider]  risperiDONE microspheres (RISPERDAL CONSTA) 50 MG injection Inject 50 mg into the muscle every 14 (fourteen) days.   Yes [provider]  sertraline (ZOLOFT) 100 MG tablet Take 100 mg by mouth daily.    Yes [provider]  ENSURE (ENSURE) Take 237 mLs by mouth 2 (two) times daily. Strawberry    [provider]  HYDROcodone-acetaminophen (NORCO) 7.5-325 MG  tablet Take 1-2 tablets by mouth every 6 (six) hours as needed for moderate pain. Patient not taking: Reported on 03/16/2019 09/27/18   Leandrew Koyanagi, MD  methocarbamol (ROBAXIN) 500 MG tablet Take 1 tablet (500 mg total) by mouth every 6 (six) hours as needed for muscle spasms. Patient not taking: Reported on 03/19/2019 10/03/18   Desiree Hane, MD  Nutritional Supplements (NUTRITIONAL SHAKE PLUS PO) Take 1 Container by mouth 3 (three) times daily.    [provider]    Physical Exam: Vitals:   03/19/19 2209 03/19/19 2300 03/19/19 2346 03/20/19 0000  BP: (!) 189/83 (!) 168/92 (!) 159/86 (!) 155/84  Pulse: 90 92 79 85  Resp: (!) 30 (!) 21 18 (!) 25  Temp:  TempSrc:      SpO2: 99% 98% 98% 97%    Constitutional: NAD, calm, comfortable Eyes: PERRL, lids and conjunctivae normal ENMT: Mucous membranes are mildly dry. Posterior pharynx clear of any exudate or lesions. Neck: normal, supple, no masses, no thyromegaly Respiratory: Decreased breath sounds in bases, otherwise clear to auscultation bilaterally, no wheezing, no crackles. Normal respiratory effort. No accessory muscle use.  Cardiovascular: Regular rate and rhythm, no murmurs / rubs / gallops. No extremity edema. 2+ pedal pulses. No carotid bruits.  Abdomen: Soft, positive suprapubic tenderness, no masses palpated. No hepatosplenomegaly. Bowel sounds positive.  Musculoskeletal: no clubbing / cyanosis.  Good ROM, no contractures. Normal muscle tone.  Skin: no clinically significant rashes, lesions, ulcers on very limited dermatological exam. Neurologic: Moves all extremities, but unable to fully evaluate. Psychiatric: Somnolent, wakes up briefly and is oriented to name only.  Labs on Admission: I have personally reviewed following labs and imaging studies  CBC: Recent Labs  Lab 03/16/19 0650 03/19/19 2142  WBC 16.5* 12.1*  NEUTROABS  --  9.1*  HGB 12.0 11.7*  HCT 37.9 37.6  MCV 98.2 99.2  PLT 299 673   Basic  Metabolic Panel: Recent Labs  Lab 03/16/19 0650 03/19/19 2142  NA 138 140  K 4.3 4.1  CL 110 110  CO2 21* 21*  GLUCOSE 135* 118*  BUN 7* 13  CREATININE 0.96 0.69  CALCIUM 10.4* 10.7*   GFR: Estimated Creatinine Clearance: 60.6 mL/min (by C-G formula based on SCr of 0.69 mg/dL). Liver Function Tests: Recent Labs  Lab 03/19/19 2142  AST 14*  ALT 16  ALKPHOS 111  BILITOT 0.3  PROT 7.8  ALBUMIN 3.6   No results for input(s): LIPASE, AMYLASE in the last 168 hours. No results for input(s): AMMONIA in the last 168 hours. Coagulation Profile: Recent Labs  Lab 03/19/19 2142  INR 1.2   Cardiac Enzymes: No results for input(s): CKTOTAL, CKMB, CKMBINDEX, TROPONINI in the last 168 hours. BNP (last 3 results) No results for input(s): PROBNP in the last 8760 hours. HbA1C: No results for input(s): HGBA1C in the last 72 hours. CBG: Recent Labs  Lab 03/19/19 2122  GLUCAP 110*   Lipid Profile: No results for input(s): CHOL, HDL, LDLCALC, TRIG, CHOLHDL, LDLDIRECT in the last 72 hours. Thyroid Function Tests: No results for input(s): TSH, T4TOTAL, FREET4, T3FREE, THYROIDAB in the last 72 hours. Anemia Panel: No results for input(s): VITAMINB12, FOLATE, FERRITIN, TIBC, IRON, RETICCTPCT in the last 72 hours. Urine analysis:    Component Value Date/Time   COLORURINE YELLOW 03/19/2019 2219   APPEARANCEUR CLEAR 03/19/2019 2219   LABSPEC 1.011 03/19/2019 2219   PHURINE 7.0 03/19/2019 2219   GLUCOSEU NEGATIVE 03/19/2019 2219   HGBUR NEGATIVE 03/19/2019 2219   [place] NEGATIVE 03/19/2019 2219   KETONESUR NEGATIVE 03/19/2019 2219   PROTEINUR NEGATIVE 03/19/2019 2219   UROBILINOGEN 1.0 08/05/2011 1751   NITRITE NEGATIVE 03/19/2019 2219   LEUKOCYTESUR TRACE (A) 03/19/2019 2219    Radiological Exams on Admission: Ct Head Wo Contrast  Result Date: 03/19/2019 CLINICAL DATA:  Altered level of consciousness (LOC), unexplained EXAM: CT HEAD WITHOUT CONTRAST TECHNIQUE:  Contiguous axial images were obtained from the base of the skull through the vertex without intravenous contrast. COMPARISON:  Head CT 06/12/2016. FINDINGS: Brain: No evidence of acute infarction, hemorrhage, hydrocephalus, extra-axial collection or mass lesion/mass effect. Similar atrophy and chronic small vessel ischemia. Vascular: Atherosclerosis of skullbase vasculature without hyperdense vessel or abnormal calcification. Skull: No fracture or focal lesion.  Sinuses/Orbits: Paranasal sinuses and mastoid air cells are clear. The visualized orbits are unremarkable. Other: None. IMPRESSION: No acute intracranial abnormality. Stable atrophy and chronic small vessel ischemia. Electronically Signed   By: Keith Rake M.D.   On: 03/19/2019 23:59   Ct Abdomen Pelvis W Contrast  Result Date: 03/19/2019 CLINICAL DATA:  Acute abdominal pain. EXAM: CT ABDOMEN AND PELVIS WITH CONTRAST TECHNIQUE: Multidetector CT imaging of the abdomen and pelvis was performed using the standard protocol following bolus administration of intravenous contrast. CONTRAST:  122mL OMNIPAQUE IOHEXOL 300 MG/ML  SOLN COMPARISON:  None. FINDINGS: Lower chest: Small to moderate pericardial effusion measuring up to 2 cm in depth adjacent to the right ventricle. Coronary artery calcifications. Borderline cardiomegaly. No acute airspace disease. Hepatobiliary: No focal liver abnormality is seen. Possible slight capsular nodularity of the left lobe. No gallstones, gallbladder wall thickening, or biliary dilatation. Pancreas: No ductal dilatation or inflammation. Spleen: Normal in size without focal abnormality. Adrenals/Urinary Tract: Normal adrenal glands. No hydronephrosis. No perinephric edema. 16 mm low-density lesion in the upper right kidney is likely a cyst, however streak artifact from adjacent spine hardware obscures accurate characterization. Slight heterogeneous enhancement of both kidneys. Urinary bladder is distended. Questionable  bladder trabeculation. Mild wall thickening about the dome. Stomach/Bowel: Bowel evaluation is limited in the absence of enteric contrast and paucity of intra-abdominal fat. Stomach is nondistended. Duodenal diverticulum without inflammation. No small bowel wall thickening, inflammatory change, or obstruction. Moderate volume of stool throughout the colon. Normal appendix, image 60 series 5. No colonic wall thickening or inflammatory change. Vascular/Lymphatic: Aortic atherosclerosis and tortuosity. No aneurysm. No abdominopelvic adenopathy. Reproductive: Uterus and bilateral adnexa are unremarkable. Other: No free air, free fluid, or intra-abdominal fluid collection. Musculoskeletal: Scoliosis and spinal fusion hardware. Multilevel degenerative change in the spine. Left hip arthroplasty. Prominent heterotopic ossification extends to the anterior iliac crest. IMPRESSION: 1. Distended urinary bladder with mild wall thickening suspicious for cystitis. Heterogeneous enhancement of both kidneys may represent pyelonephritis, however detailed evaluation is limited by streak artifact from spinal fusion hardware. 2. Small to moderate pericardial effusion. 3. Questionable nodular contours about the left lobe of the liver, recommend correlation for cirrhosis risk factors. Aortic Atherosclerosis (ICD10-I70.0). Electronically Signed   By: Keith Rake M.D.   On: 03/19/2019 23:56   Dg Chest Port 1 View  Result Date: 03/19/2019 CLINICAL DATA:  Sepsis.  Altered mental status. EXAM: PORTABLE CHEST 1 VIEW COMPARISON:  Chest x-ray dated March 16, 2019. FINDINGS: The heart size and mediastinal contours are within normal limits. Normal pulmonary vascularity. No focal consolidation, pleural effusion, or pneumothorax. No acute osseous abnormality. Unchanged thoracolumbar scoliosis status post Harrington rod fixation. IMPRESSION: No active disease. Electronically Signed   By: Titus Dubin M.D.   On: 03/19/2019 21:39    EKG:  Independently reviewed. Vent. rate 82 BPM PR interval * ms QRS duration 135 ms QT/QTc 407/476 ms P-R-T axes 89 -70 81 Sinus rhythm Consider left atrial enlargement RBBB and LAFB  Assessment/Plan Principal Problem:   Sepsis secondary to UTI ([place]) Admit to telemetry/inpatient. Continue gentle IV fluids pain Continue ceftriaxone 1 g IVPB every 24 hours. Follow-up blood culture and sensitivity. Follow-up urine culture and sensitivity.  Active Problems:   Acute encephalopathy Likely due to UTI. Neuro checks Continue treatment for urosepsis.    Hypercalcemia Recheck calcium level in a.m. Consider checking PTH and vitamin D level if still elevated.    Schizoaffective disorder (HCC) Continue Cogentin 1 mg p.o. twice daily. Continue Prolixin 20  mg p.o. daily. Continue Latuda 120 mg p.o. daily. Continue lithium carbonate 300 mg p.o. in a.m. Continue lithium carbonate 150 mg p.o. at bedtime. Continue Ativan 0.5 mg p.o. 4 times a day. Continue melatonin 5 mg p.o. bedtime. Continue Risperdal biweekly injections if needed.. Continue sertraline 100 mg p.o. daily.    Bifascicular block Check echocardiogram.    Essential hypertension Continue metoprolol 25 mg p.o. twice daily. Continue lisinopril 5 mg p.o. daily. Monitor BP, HR, renal function electrolytes.   DVT prophylaxis: Lovenox SQ. Code Status: Full code. Family Communication: Disposition Plan: Admit for IV antibiotic therapy for 2 to 3 days. Consults called:  Admission status: Inpatient/telemetry.   Reubin Milan MD [facility]  If 7PM-7AM, please contact night-coverage [email or web address]  03/20/2019, 12:54 AM   This document was prepared using Dragon voice recognition software and may contain some unintended transcription errors.

## 2019-03-20 NOTE — ED Notes (Signed)
Waiting for pharmacy to send up Lovenox.

## 2019-03-20 NOTE — Progress Notes (Signed)
RN got a report from ED at 1502. Patient was transferred to 1550 due to transportation issue. Alert and oriented x 1. No pain complained. Room is set up. Call light is within patient's reach. RN called patient's son to update as well.

## 2019-03-20 NOTE — Telephone Encounter (Signed)
+   Bc currently adm. ? contaminent per Pharm D

## 2019-03-20 NOTE — ED Notes (Signed)
When this RN and Grandville Silos, RN were giving medications, pt was responding to internal stimuli. When asked if she was talking to someone, pt reported she was "talking to God" RN asked pt is she was hearing voices. Pt nodded yes and reported "Yes and they don't like me"

## 2019-03-20 NOTE — ED Notes (Signed)
ED TO INPATIENT HANDOFF REPORT  ED Nurse Name and Phone #: [phone]  S Name/Age/Gender Heidi Blevins 68 y.o. female Room/Bed: WA18/WA18  Code Status   Code Status: Full Code  Home/SNF/Other Nursing Home Patient oriented to: self and situation Is this baseline? Yes   Triage Complete: Triage complete  Chief Complaint Altered Mental Status  Triage Note Patient arrives by [facility] from [facility]-called out for altered mental status. EMS states this has been ongoing since discharge from [facility] on Keflex for UTI-weakness,lethargic-normally goes out to smoke and is alert and oriented.   Allergies Allergies  Allergen Reactions  . Sulfonamide Derivatives Hives         Level of Care/Admitting Diagnosis ED Disposition    ED Disposition Condition Comment   Admit  Hospital Area: [facility] [[id number]]  Level of Care: Telemetry [5]  Admit to tele based on following criteria: Monitor for Ischemic changes  Covid Evaluation: Confirmed COVID Negative  Diagnosis: Sepsis secondary to UTI [facility]) [[id number]]  Admitting Physician: Reubin Milan [[id number]]  Attending Physician: Reubin Milan [[id number]]  Estimated length of stay: past midnight tomorrow  Certification:: I certify this patient will need inpatient services for at least 2 midnights  PT Class (Do Not Modify): Inpatient [101]  PT Acc Code (Do Not Modify): Private [1]       B Medical/Surgery History Past Medical History:  Diagnosis Date  . Anxiety   . Arrhythmia   . Bifascicular block   . Depression   . Hypertension   . Insomnia   . Schizoaffective disorder   . Scoliosis   . Vitamin D deficiency    Past Surgical History:  Procedure Laterality Date  . BACK SURGERY     "as a child after she fell out of a tree"  . TONSILLECTOMY    . TOTAL HIP ARTHROPLASTY Left 09/27/2018   Procedure: LEFT TOTAL HIP ARTHROPLASTY ANTERIOR APPROACH;  Surgeon: Leandrew Koyanagi, MD;   Location: [facility];  Service: Orthopedics;  Laterality: Left;     A IV Location/Drains/Wounds Patient Lines/Drains/Airways Status   Active Line/Drains/Airways    Name:   Placement date:   Placement time:   Site:   Days:   Peripheral IV 03/19/19 Right;Anterior;Upper Forearm   03/19/19    2229    Forearm   1   Incision (Closed) 09/27/18 Hip Left   09/27/18    1511     174          Intake/Output Last 24 hours  Intake/Output Summary (Last 24 hours) at 03/20/2019 1456 Last data filed at 03/20/2019 0354 Gross per 24 hour  Intake 2100 ml  Output 150 ml  Net 1950 ml    Labs/Imaging Results for orders placed or performed during the hospital encounter of 03/19/19 (from the past 48 hour(s))  CBG monitoring, ED     Status: Abnormal   Collection Time: 03/19/19  9:22 PM  Result Value Ref Range   Glucose-Capillary 110 (H) 70 - 99 mg/dL   Comment 1 Notify RN   Comprehensive metabolic panel     Status: Abnormal   Collection Time: 03/19/19  9:42 PM  Result Value Ref Range   Sodium 140 135 - 145 mmol/L   Potassium 4.1 3.5 - 5.1 mmol/L   Chloride 110 98 - 111 mmol/L   CO2 21 (L) 22 - 32 mmol/L   Glucose, Bld 118 (H) 70 - 99 mg/dL   BUN 13 8 - 23 mg/dL  Creatinine, Ser 0.69 0.44 - 1.00 mg/dL   Calcium 10.7 (H) 8.9 - 10.3 mg/dL   Total Protein 7.8 6.5 - 8.1 g/dL   Albumin 3.6 3.5 - 5.0 g/dL   AST 14 (L) 15 - 41 U/L   ALT 16 0 - 44 U/L   Alkaline Phosphatase 111 38 - 126 U/L   Total Bilirubin 0.3 0.3 - 1.2 mg/dL   GFR calc non Af Amer >60 >60 mL/min   GFR calc Af Amer >60 >60 mL/min   Anion gap 9 5 - 15    Comment: Performed at [facility], [place] [address] [place], [place] [address]  Lactic acid, plasma     Status: None   Collection Time: 03/19/19  9:42 PM  Result Value Ref Range   Lactic Acid, Venous 0.8 0.5 - 1.9 mmol/L    Comment: Performed at [facility], [place] [address], [place], [place] [address]  CBC WITH DIFFERENTIAL     Status: Abnormal    Collection Time: 03/19/19  9:42 PM  Result Value Ref Range   WBC 12.1 (H) 4.0 - 10.5 K/uL   RBC 3.79 (L) 3.87 - 5.11 MIL/uL   Hemoglobin 11.7 (L) 12.0 - 15.0 g/dL   HCT 37.6 36.0 - 46.0 %   MCV 99.2 80.0 - 100.0 fL   MCH 30.9 26.0 - 34.0 pg   MCHC 31.1 30.0 - 36.0 g/dL   RDW 12.6 11.5 - 15.5 %   Platelets 337 150 - 400 K/uL   nRBC 0.0 0.0 - 0.2 %   Neutrophils Relative % 74 %   Neutro Abs 9.1 (H) 1.7 - 7.7 K/uL   Lymphocytes Relative 17 %   Lymphs Abs 2.0 0.7 - 4.0 K/uL   Monocytes Relative 6 %   Monocytes Absolute 0.7 0.1 - 1.0 K/uL   Eosinophils Relative 1 %   Eosinophils Absolute 0.1 0.0 - 0.5 K/uL   Basophils Relative 1 %   Basophils Absolute 0.1 0.0 - 0.1 K/uL   Immature Granulocytes 1 %   Abs Immature Granulocytes 0.06 0.00 - 0.07 K/uL    Comment: Performed at [facility], [place] [address], [place], [place] [address]  APTT     Status: Abnormal   Collection Time: 03/19/19  9:42 PM  Result Value Ref Range   aPTT 38 (H) 24 - 36 seconds    Comment:        IF BASELINE aPTT IS ELEVATED, SUGGEST PATIENT RISK ASSESSMENT BE USED TO DETERMINE APPROPRIATE ANTICOAGULANT THERAPY. Performed at [facility], [place] [address] [place], [place] [address]   Protime-INR     Status: None   Collection Time: 03/19/19  9:42 PM  Result Value Ref Range   Prothrombin Time 15.0 11.4 - 15.2 seconds   INR 1.2 0.8 - 1.2    Comment: (NOTE) INR goal varies based on device and disease states. Performed at [facility] [place] [address], [place], [place] [address]   Blood Culture (routine x 2)     Status: None (Preliminary result)   Collection Time: 03/19/19  9:42 PM   Specimen: BLOOD  Result Value Ref Range   Specimen Description      BLOOD RIGHT ANTECUBITAL Performed at [facility], [place] [address], [place], [place] [address]    Special Requests      BOTTLES DRAWN AEROBIC AND ANAEROBIC Blood Culture adequate  volume Performed at [place]  [address] [place], [place] [address]    Culture      NO GROWTH < 12 HOURS Performed at [place] [address] [place], [place] [address]    Report Status PENDING   Magnesium     Status: None   Collection Time: 03/19/19  9:42 PM  Result Value Ref Range   Magnesium 2.3 1.7 - 2.4 mg/dL    Comment: Performed at [facility], [place] [address], [place], [place] [address]  Phosphorus     Status: None   Collection Time: 03/19/19  9:42 PM  Result Value Ref Range   Phosphorus 3.0 2.5 - 4.6 mg/dL    Comment: Performed at [facility], [place] [address] [place], [place] [address]  Urinalysis, Routine w reflex microscopic     Status: Abnormal   Collection Time: 03/19/19 10:19 PM  Result Value Ref Range   Color, Urine YELLOW YELLOW   APPearance CLEAR CLEAR   Specific Gravity, Urine 1.011 1.005 - 1.030   pH 7.0 5.0 - 8.0   Glucose, UA NEGATIVE NEGATIVE mg/dL   Hgb urine dipstick NEGATIVE NEGATIVE   Bilirubin Urine NEGATIVE NEGATIVE   Ketones, ur NEGATIVE NEGATIVE mg/dL   Protein, ur NEGATIVE NEGATIVE mg/dL   Nitrite NEGATIVE NEGATIVE   Leukocytes,Ua TRACE (A) NEGATIVE   RBC / HPF 0-5 0 - 5 RBC/hpf   WBC, UA 11-20 0 - 5 WBC/hpf   Bacteria, UA NONE SEEN NONE SEEN   Squamous Epithelial / LPF 0-5 0 - 5   Mucus PRESENT    Hyaline Casts, UA PRESENT     Comment: Performed at [facility], [place] [address] [place],  [address]  SARS Coronavirus 2 (CEPHEID - Performed in [place] hospital lab), Hosp Order     Status: None   Collection Time: 03/19/19 11:29 PM   Specimen: Nasopharyngeal Swab  Result Value Ref Range   SARS Coronavirus 2 NEGATIVE NEGATIVE    Comment: (NOTE) If result is NEGATIVE SARS-CoV-2 target nucleic acids are NOT DETECTED. The SARS-CoV-2 RNA is generally detectable in upper and lower  respiratory specimens during the acute phase of infection.  The lowest  concentration of SARS-CoV-2 viral copies this assay can detect is 250  copies / mL. A negative result does not preclude SARS-CoV-2 infection  and should not be used as the sole basis for treatment or other  patient management decisions.  A negative result may occur with  improper specimen collection / handling, submission of specimen other  than nasopharyngeal swab, presence of viral mutation(s) within the  areas targeted by this assay, and inadequate number of viral copies  (<250 copies / mL). A negative result must be combined with clinical  observations, patient history, and epidemiological information. If result is POSITIVE SARS-CoV-2 target nucleic acids are DETECTED. The SARS-CoV-2 RNA is generally detectable in upper and lower  respiratory specimens dur ing the acute phase of infection.  Positive  results are indicative of active infection with SARS-CoV-2.  Clinical  correlation with patient history and other diagnostic information is  necessary to determine patient infection status.  Positive results do  not rule out bacterial infection or co-infection with other viruses. If result is PRESUMPTIVE POSTIVE SARS-CoV-2 nucleic acids MAY BE PRESENT.   A presumptive positive result was obtained on the submitted specimen  and confirmed on repeat testing.  While 2019 novel coronavirus  (SARS-CoV-2) nucleic acids may be present in the submitted sample  additional confirmatory testing may  be necessary for epidemiological  and / or clinical management purposes  to differentiate between  SARS-CoV-2 and other Sarbecovirus currently known to infect humans.  If clinically indicated additional testing with an alternate test  methodology [phone]) is advised. The SARS-CoV-2 RNA is generally  detectable in upper and lower respiratory sp ecimens during the acute  phase of infection. The expected result is Negative. Fact Sheet for Patients:   StrictlyIdeas.no Fact Sheet for Healthcare Providers: BankingDealers.co.za This test is not yet approved or cleared by the [place] FDA and has been authorized for detection and/or diagnosis of SARS-CoV-2 by FDA under an Emergency Use Authorization (EUA).  This EUA will remain in effect (meaning this test can be used) for the duration of the COVID-19 declaration under Section 564(b)(1) of the Act, 21 U.S.C. section 360bbb-3(b)(1), unless the authorization is terminated or revoked sooner. Performed at [facility], [place] [address], [place], [place] [address]   CBC with Differential     Status: Abnormal   Collection Time: 03/20/19  5:00 AM  Result Value Ref Range   WBC 12.8 (H) 4.0 - 10.5 K/uL   RBC 3.56 (L) 3.87 - 5.11 MIL/uL   Hemoglobin 11.1 (L) 12.0 - 15.0 g/dL   HCT 36.0 36.0 - 46.0 %   MCV 101.1 (H) 80.0 - 100.0 fL   MCH 31.2 26.0 - 34.0 pg   MCHC 30.8 30.0 - 36.0 g/dL   RDW 12.6 11.5 - 15.5 %   Platelets 279 150 - 400 K/uL   nRBC 0.0 0.0 - 0.2 %   Neutrophils Relative % 75 %   Neutro Abs 9.6 (H) 1.7 - 7.7 K/uL   Lymphocytes Relative 16 %   Lymphs Abs 2.0 0.7 - 4.0 K/uL   Monocytes Relative 7 %   Monocytes Absolute 0.9 0.1 - 1.0 K/uL   Eosinophils Relative 1 %   Eosinophils Absolute 0.2 0.0 - 0.5 K/uL   Basophils Relative 1 %   Basophils Absolute 0.1 0.0 - 0.1 K/uL   Immature Granulocytes 0 %   Abs Immature Granulocytes 0.04 0.00 - 0.07 K/uL    Comment: Performed at [facility], [place] [address] [place], [place] [address]   Ct Head Wo Contrast  Result Date: 03/19/2019 CLINICAL DATA:  Altered level of consciousness (LOC), unexplained EXAM: CT HEAD WITHOUT CONTRAST TECHNIQUE: Contiguous axial images were obtained from the base of the skull through the vertex without intravenous contrast. COMPARISON:  Head CT 06/12/2016. FINDINGS: Brain: No evidence of acute infarction, hemorrhage,  hydrocephalus, extra-axial collection or mass lesion/mass effect. Similar atrophy and chronic small vessel ischemia. Vascular: Atherosclerosis of skullbase vasculature without hyperdense vessel or abnormal calcification. Skull: No fracture or focal lesion. Sinuses/Orbits: Paranasal sinuses and mastoid air cells are clear. The visualized orbits are unremarkable. Other: None. IMPRESSION: No acute intracranial abnormality. Stable atrophy and chronic small vessel ischemia. Electronically Signed   By: Keith Rake M.D.   On: 03/19/2019 23:59   Ct Abdomen Pelvis W Contrast  Result Date: 03/19/2019 CLINICAL DATA:  Acute abdominal pain. EXAM: CT ABDOMEN AND PELVIS WITH CONTRAST TECHNIQUE: Multidetector CT imaging of the abdomen and pelvis was performed using the standard protocol following bolus administration of intravenous contrast. CONTRAST:  184mL OMNIPAQUE IOHEXOL 300 MG/ML  SOLN COMPARISON:  None. FINDINGS: Lower chest: Small to moderate pericardial effusion measuring up to 2 cm in depth adjacent to the right ventricle. Coronary artery calcifications. Borderline cardiomegaly. No acute airspace disease. Hepatobiliary: No focal liver abnormality is  seen. Possible slight capsular nodularity of the left lobe. No gallstones, gallbladder wall thickening, or biliary dilatation. Pancreas: No ductal dilatation or inflammation. Spleen: Normal in size without focal abnormality. Adrenals/Urinary Tract: Normal adrenal glands. No hydronephrosis. No perinephric edema. 16 mm low-density lesion in the upper right kidney is likely a cyst, however streak artifact from adjacent spine hardware obscures accurate characterization. Slight heterogeneous enhancement of both kidneys. Urinary bladder is distended. Questionable bladder trabeculation. Mild wall thickening about the dome. Stomach/Bowel: Bowel evaluation is limited in the absence of enteric contrast and paucity of intra-abdominal fat. Stomach is nondistended. Duodenal  diverticulum without inflammation. No small bowel wall thickening, inflammatory change, or obstruction. Moderate volume of stool throughout the colon. Normal appendix, image 60 series 5. No colonic wall thickening or inflammatory change. Vascular/Lymphatic: Aortic atherosclerosis and tortuosity. No aneurysm. No abdominopelvic adenopathy. Reproductive: Uterus and bilateral adnexa are unremarkable. Other: No free air, free fluid, or intra-abdominal fluid collection. Musculoskeletal: Scoliosis and spinal fusion hardware. Multilevel degenerative change in the spine. Left hip arthroplasty. Prominent heterotopic ossification extends to the anterior iliac crest. IMPRESSION: 1. Distended urinary bladder with mild wall thickening suspicious for cystitis. Heterogeneous enhancement of both kidneys may represent pyelonephritis, however detailed evaluation is limited by streak artifact from spinal fusion hardware. 2. Small to moderate pericardial effusion. 3. Questionable nodular contours about the left lobe of the liver, recommend correlation for cirrhosis risk factors. Aortic Atherosclerosis (ICD10-I70.0). Electronically Signed   By: Keith Rake M.D.   On: 03/19/2019 23:56   Dg Chest Port 1 View  Result Date: 03/19/2019 CLINICAL DATA:  Sepsis.  Altered mental status. EXAM: PORTABLE CHEST 1 VIEW COMPARISON:  Chest x-ray dated March 16, 2019. FINDINGS: The heart size and mediastinal contours are within normal limits. Normal pulmonary vascularity. No focal consolidation, pleural effusion, or pneumothorax. No acute osseous abnormality. Unchanged thoracolumbar scoliosis status post Harrington rod fixation. IMPRESSION: No active disease. Electronically Signed   By: Titus Dubin M.D.   On: 03/19/2019 21:39    Pending Labs Unresulted Labs (From admission, onward)    Start     Ordered   03/27/19 0500  Creatinine, serum  (enoxaparin (LOVENOX)    CrCl >/= 30 ml/min)  Weekly,   R    Comments: while on enoxaparin  therapy    03/20/19 0040   03/20/19 4970  Basic metabolic panel  Once,   R     03/20/19 0601   03/19/19 2122  Blood Culture (routine x 2)  BLOOD CULTURE X 2,   STAT     03/19/19 2122   03/19/19 2122  Urine culture  ONCE - STAT,   STAT     03/19/19 2122          Vitals/Pain Today's Vitals   03/20/19 1130 03/20/19 1230 03/20/19 1330 03/20/19 1400  BP: (!) 174/98 (!) 174/94 (!) 177/75 (!) 171/83  Pulse:    78  Resp: (!) 22 (!) 27 14 20   Temp:      TempSrc:      SpO2:    99%  PainSc:        Isolation Precautions No active isolations  Medications Medications  cefTRIAXone (ROCEPHIN) 1 g in sodium chloride 0.9 % 100 mL IVPB (0 g Intravenous Stopped 03/20/19 0116)  enoxaparin (LOVENOX) injection 40 mg (40 mg Subcutaneous Given 03/20/19 1036)  acetaminophen (TYLENOL) tablet 650 mg (has no administration in time range)    Or  acetaminophen (TYLENOL) suppository 650 mg (has no administration in time range)  aspirin EC tablet 81 mg (81 mg Oral Given 03/20/19 1016)  benztropine (COGENTIN) tablet 1 mg (1 mg Oral Given 03/20/19 1018)  ferrous [id number] C-folic acid (TRINSICON / FOLTRIN) capsule 1 capsule (has no administration in time range)  fluPHENAZine (PROLIXIN) tablet 20 mg (20 mg Oral Given 03/20/19 1019)  lurasidone (LATUDA) tablet 120 mg (120 mg Oral Given 03/20/19 1019)  lisinopril (ZESTRIL) tablet 5 mg (5 mg Oral Given 03/20/19 1017)  lithium carbonate capsule 300 mg (300 mg Oral Given 03/20/19 1016)  loratadine (CLARITIN) tablet 10 mg (10 mg Oral Given 03/20/19 1016)  LORazepam (ATIVAN) tablet 0.5 mg (0.5 mg Oral Given 03/20/19 1414)  Melatonin TABS 5 mg (has no administration in time range)  metoprolol tartrate (LOPRESSOR) tablet 12.5 mg (12.5 mg Oral Given 03/20/19 1018)  polyethylene glycol (MIRALAX / GLYCOLAX) packet 17 g (17 g Oral Given 03/20/19 1024)  sertraline (ZOLOFT) tablet 100 mg (100 mg Oral Given 03/20/19 1018)  polyvinyl alcohol (LIQUIFILM TEARS) 1.4 %  ophthalmic solution 1 drop (1 drop Both Eyes Given 03/20/19 1019)  lithium carbonate capsule 150 mg (has no administration in time range)  sodium chloride flush (NS) 0.9 % injection 3 mL (3 mLs Intravenous Given 03/19/19 2148)  lactated ringers bolus 1,000 mL (0 mLs Intravenous Stopped 03/19/19 2354)  LORazepam (ATIVAN) injection 1 mg (1 mg Intravenous Given 03/19/19 2324)  iohexol (OMNIPAQUE) 300 MG/ML solution 100 mL (100 mLs Intravenous Contrast Given 03/19/19 2331)  sodium chloride 0.9 % bolus 1,000 mL (0 mLs Intravenous Stopped 03/20/19 0916)    Mobility walks with person assist High fall risk   Focused Assessments UTI   R Recommendations: See Admitting Provider Note  Report given to:   Additional Notes: Pt has a UTI, Bipolar and schizophrenia

## 2019-03-20 NOTE — Progress Notes (Signed)
I have seen and assessed patient and agree with Dr. Lavetta Nielsen assessment and plan.  Patient is 68 year old female history of anxiety, arrhythmia, bifascicular block, depression, hypertension, insomnia, schizoaffective disorder, scoliosis, vitamin D deficiency brought to the ED from her facility due to altered mental status.  Patient noted to have worsening altered mental status and she was discharged from [facility] and started on Keflex for UTI.  Patient noted to be weak and lethargic.  Patient admitted under diagnosis of sepsis secondary to UTI.  Patient pancultured.  Patient placed empirically on IV Rocephin.  Patient alert to self and place.  Unsure of the year.  Knows president Trump is the president.  Following commands appropriately.  Patient does state she was hearing voices that are telling her they do not like her.  Patient denies any suicidal ideation or homicidal ideation at this time.  Continue treatment for UTI.  Follow.    No charge.

## 2019-03-20 NOTE — ED Notes (Addendum)
Pt. Documented in error see note in chart. 

## 2019-03-20 NOTE — ED Provider Notes (Signed)
Care assumed from Dr. Regenia Skeeter, patient with apparent urinary tract infection and altered mentation pending CT of head and abdomen and pelvis.  CT scans show no acute process.  She has received antibiotics for her urinary tract infection.  Case is discussed with Dr. Olevia Bowens of [facility], who agrees to admit the patient.   Heidi Fuel, MD 37/95/58 [phone]

## 2019-03-20 NOTE — ED Notes (Signed)
Patient placed on bedpan. Peri care performed after voiding.

## 2019-03-21 DIAGNOSIS — G9341 Metabolic encephalopathy: Secondary | ICD-10-CM

## 2019-03-21 DIAGNOSIS — I452 Bifascicular block: Secondary | ICD-10-CM

## 2019-03-21 LAB — CBC WITH DIFFERENTIAL/PLATELET
Abs Immature Granulocytes: 0.05 10*3/uL (ref 0.00–0.07)
Basophils Absolute: 0.1 10*3/uL (ref 0.0–0.1)
Basophils Relative: 1 %
Eosinophils Absolute: 0.4 10*3/uL (ref 0.0–0.5)
Eosinophils Relative: 3 %
HCT: 33.6 % — ABNORMAL LOW (ref 36.0–46.0)
Hemoglobin: 10.5 g/dL — ABNORMAL LOW (ref 12.0–15.0)
Immature Granulocytes: 1 %
Lymphocytes Relative: 18 %
Lymphs Abs: 2 10*3/uL (ref 0.7–4.0)
MCH: 31.2 pg (ref 26.0–34.0)
MCHC: 31.3 g/dL (ref 30.0–36.0)
MCV: 99.7 fL (ref 80.0–100.0)
Monocytes Absolute: 0.8 10*3/uL (ref 0.1–1.0)
Monocytes Relative: 7 %
Neutro Abs: 7.8 10*3/uL — ABNORMAL HIGH (ref 1.7–7.7)
Neutrophils Relative %: 70 %
Platelets: 300 10*3/uL (ref 150–400)
RBC: 3.37 MIL/uL — ABNORMAL LOW (ref 3.87–5.11)
RDW: 12.4 % (ref 11.5–15.5)
WBC: 11 10*3/uL — ABNORMAL HIGH (ref 4.0–10.5)
nRBC: 0 % (ref 0.0–0.2)

## 2019-03-21 LAB — BASIC METABOLIC PANEL
Anion gap: 9 (ref 5–15)
BUN: 8 mg/dL (ref 8–23)
CO2: 19 mmol/L — ABNORMAL LOW (ref 22–32)
Calcium: 9.6 mg/dL (ref 8.9–10.3)
Chloride: 110 mmol/L (ref 98–111)
Creatinine, Ser: 0.64 mg/dL (ref 0.44–1.00)
GFR calc Af Amer: >60 mL/min (ref >60)
GFR calc non Af Amer: >60 mL/min (ref >60)
Glucose, Bld: 91 mg/dL (ref 70–99)
Potassium: 4.4 mmol/L (ref 3.5–5.1)
Sodium: 138 mmol/L (ref 135–145)

## 2019-03-21 LAB — CULTURE, BLOOD (ROUTINE X 2)
Culture: NO GROWTH
Special Requests: ADEQUATE

## 2019-03-21 MED ORDER — LISINOPRIL 5 MG PO TABS
5.0000 mg | ORAL_TABLET | Freq: Every day | ORAL | Status: DC
  Administered 2019-03-22 – 2019-03-24 (×3): 5 mg via ORAL
  Filled 2019-03-21 (×3): qty 1

## 2019-03-21 MED ORDER — METOPROLOL TARTRATE 12.5 MG HALF TABLET
12.5000 mg | ORAL_TABLET | Freq: Two times a day (BID) | ORAL | Status: DC
  Administered 2019-03-22 – 2019-03-23 (×4): 12.5 mg via ORAL
  Filled 2019-03-21 (×6): qty 1

## 2019-03-21 MED ORDER — SENNOSIDES-DOCUSATE SODIUM 8.6-50 MG PO TABS
1.0000 | ORAL_TABLET | Freq: Two times a day (BID) | ORAL | Status: DC
  Administered 2019-03-21 – 2019-03-25 (×9): 1 via ORAL
  Filled 2019-03-21 (×9): qty 1

## 2019-03-21 MED ORDER — SORBITOL 70 % SOLN
30.0000 mL | Freq: Once | Status: AC
  Administered 2019-03-21: 30 mL via ORAL
  Filled 2019-03-21: qty 30

## 2019-03-21 NOTE — Progress Notes (Signed)
PROGRESS NOTE    Heidi Blevins  IWP:[id number] DOB: 1951/07/17 DOA: 03/19/2019 PCP: [name], MD   Brief Narrative:  HPI per [name] is a 68 y.o. female with medical history significant of anxiety, arrhythmia, bifascicular block, depression, hypertension, insomnia schizoaffective disorder, scoliosis, vitamin D deficiency who is brought to the emergency department from her facility due to [place].  The patient has had worsening mental status since she was discharged from [facility] and started on Keflex for UTI.  She has been weak and lethargic.  EMS stated the patient normally goes out to smoke, is usually alert and oriented.  She is unable to answer questions at this time and provide further history.  ED Course: Initial vital signs temperature 99 F, pulse 81, respirations 30, blood pressure 166/98 mmHg and O2 sat 97% on room air.  Patient was given a 1000 mL of LR bolus, Ativan 1 mg IVP and a gram of Rocephin IVPB.  Her urinalysis showed trace leukocyte esterase, otherwise was normal white count was 12.1, hemoglobin 11.7 g/dL and platelets 337.  PT was 15.0, INR 1.2 and APTT 38 seconds.  CMP shows a CO2 21 mmol/L.  Glucose of 118 and calcium of 10.7 mg/dL.  All other values are unremarkable.  Her lactic acid was normal.  Blood cultures x2 were drawn.  Urine culture was sent to the lab.  Imaging: CT head did not show any acute intracranial pathology.  CT abdomen/pelvis showed distended urinary bladder with mild wall thickening suspicious for cystitis.  There is heterogeneous enhancement of both kidneys which could represent pyelonephritis, however detailed evaluation is limited by streak active 5 from a spinal fusion hardware.  There is a small to moderate pericardial effusion.  Nodularity of the left lobe of liver.  Please see images and full radiology report for further details.  Assessment & Plan:   Principal Problem:   Sepsis secondary to UTI [facility]) Active Problems:  Hypercalcemia   Acute encephalopathy   Schizoaffective disorder (HCC)   Bifascicular block   Essential hypertension   1 sepsis secondary to UTI, POA Patient admitted with acute encephalopathy with lethargy and generalized weakness.  Patient recently diagnosed with a UTI and discharged from [facility] on Keflex.  Urinalysis done on admission with trace leukocytes, nitrite negative, 11-20 WBCs.  Patient with recent urinalysis on 03/16/2019 with urine cultures positive for E. coli which was sensitive to the cephalosporins, resistant to ciprofloxacin, sensitive to Zosyn, Bactrim, Macrobid, imipenem, gentamicin and resistant to ampicillin.  Patient more alert and improving clinically.  Repeat urine cultures pending.  Continue empiric IV Rocephin, supportive care.  Follow.  2.  Acute metabolic encephalopathy Secondary to problem #1.  Patient also noted with complaints of auditory hallucinations.  Continue empiric IV Rocephin.  Follow.  3.  Schizoaffective disorder Patient with a history of schizoaffective disorder on psychiatric medications.  Patient with complaints of auditory hallucinations.  Continue current regimen of Cogentin, Prolixin, Latuda, lithium, Ativan, melatonin, Zoloft, Risperdal as needed.  Due to patient's auditory hallucinations will consult with psychiatry for further evaluation and management.  Follow.  4.  Bifascicular block 2D echo ordered and pending.  Follow.  5.  Hypertension Patient noted to have systolic blood pressures in the 90s this morning.  Will hold antihypertensive medications and resume tomorrow.  Repeat blood pressure manually.  6.  Hypercalcemia Likely secondary to dehydration.  Improved with hydration.  Outpatient follow-up.  7.  Constipation Place on MiraLAX 17 g daily.  Senokot-S  twice daily.   DVT prophylaxis: Lovenox Code Status: Full Family Communication: Updated patient.  No family at bedside. Disposition Plan: Back to facility/group home when  clinically stable.   Consultants:   Psychiatry pending  Procedures:   CT head 03/19/2019  CT abdomen and pelvis 03/19/2019  Chest x-ray 03/19/2019  2D echo pending 03/20/2019  Antimicrobials:   IV Rocephin 03/19/2019   Subjective: Patient sitting up.  Patient just ate her lunch.  Patient denies any chest pain.  No shortness of breath.  States feeling some better from admission.  Patient complaining of hearing voices which is stating that they hate her.  Patient with some complaints of constipation.  Objective: Vitals:   03/20/19 1602 03/20/19 1956 03/21/19 0519 03/21/19 0950  BP: (!) 152/82 101/75 (!) 97/51 140/70  Pulse: 75 65 (!) 56   Resp: 20 19 19    Temp: 99.7 F (37.6 C) 98.3 F (36.8 C) 97.9 F (36.6 C)   TempSrc: Oral Oral Oral   SpO2:  100% 96%   Weight: 61 kg     Height: 5\' 5"  (1.651 m)       Intake/Output Summary (Last 24 hours) at 03/21/2019 1228 Last data filed at 03/21/2019 1141 Gross per 24 hour  Intake 1826.46 ml  Output 600 ml  Net 1226.46 ml   Filed Weights   03/20/19 1602  Weight: 61 kg    Examination:  General exam: Appears calm and comfortable  Respiratory system: Clear to auscultation. Respiratory effort normal. Cardiovascular system: S1 & S2 heard, RRR. No JVD, murmurs, rubs, gallops or clicks. No pedal edema. Gastrointestinal system: Abdomen is distended, soft, nontender, positive bowel sounds.  No rebound.  No guarding. Central nervous system: Alert and oriented. No focal neurological deficits. Extremities: Symmetric 5 x 5 power. Skin: No rashes, lesions or ulcers Psychiatry: Judgement and insight appear normal. Mood & affect appropriate.     Data Reviewed: I have personally reviewed following labs and imaging studies  CBC: Recent Labs  Lab 03/16/19 0650 03/19/19 2142 03/20/19 0500 03/21/19 0558  WBC 16.5* 12.1* 12.8* 11.0*  NEUTROABS  --  9.1* 9.6* 7.8*  HGB 12.0 11.7* 11.1* 10.5*  HCT 37.9 37.6 36.0 33.6*  MCV 98.2 99.2  101.1* 99.7  PLT 299 337 279 628   Basic Metabolic Panel: Recent Labs  Lab 03/16/19 0650 03/19/19 2142 03/20/19 1645 03/21/19 0558  NA 138 140 138 138  K 4.3 4.1 3.7 4.4  CL 110 110 109 110  CO2 21* 21* 18* 19*  GLUCOSE 135* 118* 93 91  BUN 7* 13 11 8   CREATININE 0.96 0.69 0.67 0.64  CALCIUM 10.4* 10.7* 10.3 9.6  MG  --  2.3  --   --   PHOS  --  3.0  --   --    GFR: Estimated Creatinine Clearance: 60.6 mL/min (by C-G formula based on SCr of 0.64 mg/dL). Liver Function Tests: Recent Labs  Lab 03/19/19 2142  AST 14*  ALT 16  ALKPHOS 111  BILITOT 0.3  PROT 7.8  ALBUMIN 3.6   No results for input(s): LIPASE, AMYLASE in the last 168 hours. No results for input(s): AMMONIA in the last 168 hours. Coagulation Profile: Recent Labs  Lab 03/19/19 2142  INR 1.2   Cardiac Enzymes: No results for input(s): CKTOTAL, CKMB, CKMBINDEX, TROPONINI in the last 168 hours. BNP (last 3 results) No results for input(s): PROBNP in the last 8760 hours. HbA1C: No results for input(s): HGBA1C in the last 72 hours. CBG:  Recent Labs  Lab 03/19/19 2122  GLUCAP 110*   Lipid Profile: No results for input(s): CHOL, HDL, LDLCALC, TRIG, CHOLHDL, LDLDIRECT in the last 72 hours. Thyroid Function Tests: No results for input(s): TSH, T4TOTAL, FREET4, T3FREE, THYROIDAB in the last 72 hours. Anemia Panel: No results for input(s): VITAMINB12, FOLATE, FERRITIN, TIBC, IRON, RETICCTPCT in the last 72 hours. Sepsis Labs: Recent Labs  Lab 03/16/19 0724 03/16/19 0858 03/19/19 2142  PROCALCITON <0.10  --   --   LATICACIDVEN 1.4 0.8 0.8    Recent Results (from the past 240 hour(s))  SARS Coronavirus 2 (CEPHEID - Performed in [facility] hospital lab), Hosp Order     Status: None   Collection Time: 03/16/19  6:45 AM   Specimen: Nasopharyngeal Swab  Result Value Ref Range Status   SARS Coronavirus 2 NEGATIVE NEGATIVE Final    Comment: (NOTE) If result is NEGATIVE SARS-CoV-2 target nucleic  acids are NOT DETECTED. The SARS-CoV-2 RNA is generally detectable in upper and lower  respiratory specimens during the acute phase of infection. The lowest  concentration of SARS-CoV-2 viral copies this assay can detect is 250  copies / mL. A negative result does not preclude SARS-CoV-2 infection  and should not be used as the sole basis for treatment or other  patient management decisions.  A negative result may occur with  improper specimen collection / handling, submission of specimen other  than nasopharyngeal swab, presence of viral mutation(s) within the  areas targeted by this assay, and inadequate number of viral copies  (<250 copies / mL). A negative result must be combined with clinical  observations, patient history, and epidemiological information. If result is POSITIVE SARS-CoV-2 target nucleic acids are DETECTED. The SARS-CoV-2 RNA is generally detectable in upper and lower  respiratory specimens dur ing the acute phase of infection.  Positive  results are indicative of active infection with SARS-CoV-2.  Clinical  correlation with patient history and other diagnostic information is  necessary to determine patient infection status.  Positive results do  not rule out bacterial infection or co-infection with other viruses. If result is PRESUMPTIVE POSTIVE SARS-CoV-2 nucleic acids MAY BE PRESENT.   A presumptive positive result was obtained on the submitted specimen  and confirmed on repeat testing.  While 2019 novel coronavirus  (SARS-CoV-2) nucleic acids may be present in the submitted sample  additional confirmatory testing may be necessary for epidemiological  and / or clinical management purposes  to differentiate between  SARS-CoV-2 and other Sarbecovirus currently known to infect humans.  If clinically indicated additional testing with an alternate test  methodology [phone]) is advised. The SARS-CoV-2 RNA is generally  detectable in upper and lower respiratory  sp ecimens during the acute  phase of infection. The expected result is Negative. Fact Sheet for Patients:  StrictlyIdeas.no Fact Sheet for Healthcare Providers: BankingDealers.co.za This test is not yet approved or cleared by the [place] FDA and has been authorized for detection and/or diagnosis of SARS-CoV-2 by FDA under an Emergency Use Authorization (EUA).  This EUA will remain in effect (meaning this test can be used) for the duration of the COVID-19 declaration under Section 564(b)(1) of the Act, 21 U.S.C. section 360bbb-3(b)(1), unless the authorization is terminated or revoked sooner. Performed at [facility], [place] [address] [place], [place] [address]   Blood Culture (routine x 2)     Status: Abnormal   Collection Time: 03/16/19  7:28 AM   Specimen: BLOOD  Result Value Ref Range  Status   Specimen Description BLOOD LEFT ANTECUBITAL  Final   Special Requests   Final    BOTTLES DRAWN AEROBIC AND ANAEROBIC Blood Culture adequate volume   Culture  Setup Time   Final    GRAM POSITIVE COCCI IN CLUSTERS ANAEROBIC BOTTLE ONLY CRITICAL RESULT CALLED TO, READ BACK BY AND VERIFIED WITH: R. WHITE AT [place], AT [place] 03/17/19 BY D.VANHOOK    Culture (A)  Final    STAPHYLOCOCCUS SPECIES (COAGULASE NEGATIVE) THE SIGNIFICANCE OF ISOLATING THIS ORGANISM FROM A SINGLE SET OF BLOOD CULTURES WHEN MULTIPLE SETS ARE DRAWN IS UNCERTAIN. PLEASE NOTIFY THE MICROBIOLOGY DEPARTMENT WITHIN ONE WEEK IF SPECIATION AND SENSITIVITIES ARE REQUIRED. Performed at [facility], [place] [address] [place], [place] [address]    Report Status 03/19/2019 FINAL  Final  Blood Culture ID Panel (Reflexed)     Status: Abnormal   Collection Time: 03/16/19  7:28 AM  Result Value Ref Range Status   Enterococcus species NOT DETECTED NOT DETECTED Final   Listeria monocytogenes NOT DETECTED NOT DETECTED Final   Staphylococcus species DETECTED (A) NOT  DETECTED Final    Comment: Methicillin (oxacillin) susceptible coagulase negative staphylococcus. Possible blood culture contaminant (unless isolated from more than one blood culture draw or clinical case suggests pathogenicity). No antibiotic treatment is indicated for blood  culture contaminants. CRITICAL RESULT CALLED TO, READ BACK BY AND VERIFIED WITH: R. WHITE RN , AT 8938 03/17/19 BY D. VANHOOK    Staphylococcus aureus (BCID) NOT DETECTED NOT DETECTED Final   Methicillin resistance NOT DETECTED NOT DETECTED Final   Streptococcus species NOT DETECTED NOT DETECTED Final   Streptococcus agalactiae NOT DETECTED NOT DETECTED Final   Streptococcus pneumoniae NOT DETECTED NOT DETECTED Final   Streptococcus pyogenes NOT DETECTED NOT DETECTED Final   Acinetobacter baumannii NOT DETECTED NOT DETECTED Final   Enterobacteriaceae species NOT DETECTED NOT DETECTED Final   Enterobacter cloacae complex NOT DETECTED NOT DETECTED Final   Escherichia coli NOT DETECTED NOT DETECTED Final   Klebsiella oxytoca NOT DETECTED NOT DETECTED Final   Klebsiella pneumoniae NOT DETECTED NOT DETECTED Final   Proteus species NOT DETECTED NOT DETECTED Final   Serratia marcescens NOT DETECTED NOT DETECTED Final   Haemophilus influenzae NOT DETECTED NOT DETECTED Final   Neisseria meningitidis NOT DETECTED NOT DETECTED Final   Pseudomonas aeruginosa NOT DETECTED NOT DETECTED Final   Candida albicans NOT DETECTED NOT DETECTED Final   Candida glabrata NOT DETECTED NOT DETECTED Final   Candida krusei NOT DETECTED NOT DETECTED Final   Candida parapsilosis NOT DETECTED NOT DETECTED Final   Candida tropicalis NOT DETECTED NOT DETECTED Final    Comment: Performed at [facility], [address] [place], [place] [address]  Blood Culture (routine x 2)     Status: None   Collection Time: 03/16/19  7:39 AM   Specimen: BLOOD  Result Value Ref Range Status   Specimen Description BLOOD RIGHT ANTECUBITAL  Final   Special  Requests   Final    BOTTLES DRAWN AEROBIC AND ANAEROBIC Blood Culture adequate volume   Culture   Final    NO GROWTH 5 DAYS Performed at [facility], [place] [address], [place], [place] [address]    Report Status 03/21/2019 FINAL  Final  Urine culture     Status: Abnormal   Collection Time: 03/16/19 10:31 AM   Specimen: Urine, Random  Result Value Ref Range Status   Specimen Description URINE, RANDOM  Final  Special Requests   Final    NONE Performed at [facility], [place] [address], [place], [place] [address]    Culture >=100,000 COLONIES/mL ESCHERICHIA COLI (A)  Final   Report Status 03/18/2019 FINAL  Final   Organism ID, Bacteria ESCHERICHIA COLI (A)  Final      Susceptibility   Escherichia coli - MIC*    AMPICILLIN >=32 RESISTANT Resistant     CEFAZOLIN <=4 SENSITIVE Sensitive     CEFTRIAXONE <=1 SENSITIVE Sensitive     CIPROFLOXACIN >=4 RESISTANT Resistant     GENTAMICIN <=1 SENSITIVE Sensitive     IMIPENEM <=0.25 SENSITIVE Sensitive     NITROFURANTOIN <=16 SENSITIVE Sensitive     TRIMETH/SULFA <=20 SENSITIVE Sensitive     AMPICILLIN/SULBACTAM 16 INTERMEDIATE Intermediate     PIP/TAZO <=4 SENSITIVE Sensitive     Extended ESBL NEGATIVE Sensitive     * >=100,000 COLONIES/mL ESCHERICHIA COLI  Blood Culture (routine x 2)     Status: None (Preliminary result)   Collection Time: 03/19/19  9:42 PM   Specimen: BLOOD  Result Value Ref Range Status   Specimen Description   Final    BLOOD RIGHT ANTECUBITAL Performed at [place] [address], [place], [place] [address]    Special Requests   Final    BOTTLES DRAWN AEROBIC AND ANAEROBIC Blood Culture adequate volume Performed at [place] [address] [place], [place] [address]    Culture   Final    NO GROWTH 2 DAYS Performed at [place] [address] [place], [place] [address]    Report Status PENDING  Incomplete  Blood Culture (routine x 2)      Status: None (Preliminary result)   Collection Time: 03/19/19  9:45 PM   Specimen: BLOOD  Result Value Ref Range Status   Specimen Description   Final    BLOOD LEFT ANTECUBITAL Performed at [place] [address] [place], [place] [address]    Special Requests   Final    BOTTLES DRAWN AEROBIC AND ANAEROBIC Blood Culture adequate volume Performed at [place] [address] [place], [place] [address]    Culture   Final    NO GROWTH 1 DAY Performed at [facility], [place] [address] [place], [place] [address]    Report Status PENDING  Incomplete  Urine culture     Status: None   Collection Time: 03/19/19 10:19 PM   Specimen: In/Out Cath Urine  Result Value Ref Range Status   Specimen Description   Final    IN/OUT CATH URINE Performed at [place] [address] [place], [place] [address]    Special Requests   Final    NONE Performed at [facility], [place] [address], [place],  [address]    Culture   Final    NO GROWTH Performed at [facility], [place] [address] [place],  [address]    Report Status 03/20/2019 FINAL  Final  SARS Coronavirus 2 (CEPHEID - Performed in [place] hospital lab), Hosp Order     Status: None   Collection Time: 03/19/19 11:29 PM   Specimen: Nasopharyngeal Swab  Result Value Ref Range Status   SARS Coronavirus 2 NEGATIVE NEGATIVE Final    Comment: (NOTE) If result is NEGATIVE SARS-CoV-2 target nucleic acids are NOT DETECTED. The SARS-CoV-2 RNA is generally detectable in upper and lower  respiratory specimens  during the acute phase of infection. The lowest  concentration of SARS-CoV-2 viral copies this assay can detect is 250  copies / mL. A negative result does not preclude SARS-CoV-2 infection  and should not be used as the sole basis for treatment or other  patient management decisions.  A negative result may occur with  improper  specimen collection / handling, submission of specimen other  than nasopharyngeal swab, presence of viral mutation(s) within the  areas targeted by this assay, and inadequate number of viral copies  (<250 copies / mL). A negative result must be combined with clinical  observations, patient history, and epidemiological information. If result is POSITIVE SARS-CoV-2 target nucleic acids are DETECTED. The SARS-CoV-2 RNA is generally detectable in upper and lower  respiratory specimens dur ing the acute phase of infection.  Positive  results are indicative of active infection with SARS-CoV-2.  Clinical  correlation with patient history and other diagnostic information is  necessary to determine patient infection status.  Positive results do  not rule out bacterial infection or co-infection with other viruses. If result is PRESUMPTIVE POSTIVE SARS-CoV-2 nucleic acids MAY BE PRESENT.   A presumptive positive result was obtained on the submitted specimen  and confirmed on repeat testing.  While 2019 novel coronavirus  (SARS-CoV-2) nucleic acids may be present in the submitted sample  additional confirmatory testing may be necessary for epidemiological  and / or clinical management purposes  to differentiate between  SARS-CoV-2 and other Sarbecovirus currently known to infect humans.  If clinically indicated additional testing with an alternate test  methodology [phone]) is advised. The SARS-CoV-2 RNA is generally  detectable in upper and lower respiratory sp ecimens during the acute  phase of infection. The expected result is Negative. Fact Sheet for Patients:  StrictlyIdeas.no Fact Sheet for Healthcare Providers: BankingDealers.co.za This test is not yet approved or cleared by the [place] FDA and has been authorized for detection and/or diagnosis of SARS-CoV-2 by FDA under an Emergency Use Authorization (EUA).  This EUA will remain in  effect (meaning this test can be used) for the duration of the COVID-19 declaration under Section 564(b)(1) of the Act, 21 U.S.C. section 360bbb-3(b)(1), unless the authorization is terminated or revoked sooner. Performed at [facility], [place] [address] [place], [place] [address]          Radiology Studies: Ct Head Wo Contrast  Result Date: 03/19/2019 CLINICAL DATA:  Altered level of consciousness (LOC), unexplained EXAM: CT HEAD WITHOUT CONTRAST TECHNIQUE: Contiguous axial images were obtained from the base of the skull through the vertex without intravenous contrast. COMPARISON:  Head CT 06/12/2016. FINDINGS: Brain: No evidence of acute infarction, hemorrhage, hydrocephalus, extra-axial collection or mass lesion/mass effect. Similar atrophy and chronic small vessel ischemia. Vascular: Atherosclerosis of skullbase vasculature without hyperdense vessel or abnormal calcification. Skull: No fracture or focal lesion. Sinuses/Orbits: Paranasal sinuses and mastoid air cells are clear. The visualized orbits are unremarkable. Other: None. IMPRESSION: No acute intracranial abnormality. Stable atrophy and chronic small vessel ischemia. Electronically Signed   By: Keith Rake M.D.   On: 03/19/2019 23:59   Ct Abdomen Pelvis W Contrast  Result Date: 03/19/2019 CLINICAL DATA:  Acute abdominal pain. EXAM: CT ABDOMEN AND PELVIS WITH CONTRAST TECHNIQUE: Multidetector CT imaging of the abdomen and pelvis was performed using the standard protocol following bolus administration of intravenous contrast. CONTRAST:  170mL OMNIPAQUE IOHEXOL 300 MG/ML  SOLN COMPARISON:  None. FINDINGS: Lower chest: Small to moderate pericardial effusion measuring up  to 2 cm in depth adjacent to the right ventricle. Coronary artery calcifications. Borderline cardiomegaly. No acute airspace disease. Hepatobiliary: No focal liver abnormality is seen. Possible slight capsular nodularity of the left lobe. No  gallstones, gallbladder wall thickening, or biliary dilatation. Pancreas: No ductal dilatation or inflammation. Spleen: Normal in size without focal abnormality. Adrenals/Urinary Tract: Normal adrenal glands. No hydronephrosis. No perinephric edema. 16 mm low-density lesion in the upper right kidney is likely a cyst, however streak artifact from adjacent spine hardware obscures accurate characterization. Slight heterogeneous enhancement of both kidneys. Urinary bladder is distended. Questionable bladder trabeculation. Mild wall thickening about the dome. Stomach/Bowel: Bowel evaluation is limited in the absence of enteric contrast and paucity of intra-abdominal fat. Stomach is nondistended. Duodenal diverticulum without inflammation. No small bowel wall thickening, inflammatory change, or obstruction. Moderate volume of stool throughout the colon. Normal appendix, image 60 series 5. No colonic wall thickening or inflammatory change. Vascular/Lymphatic: Aortic atherosclerosis and tortuosity. No aneurysm. No abdominopelvic adenopathy. Reproductive: Uterus and bilateral adnexa are unremarkable. Other: No free air, free fluid, or intra-abdominal fluid collection. Musculoskeletal: Scoliosis and spinal fusion hardware. Multilevel degenerative change in the spine. Left hip arthroplasty. Prominent heterotopic ossification extends to the anterior iliac crest. IMPRESSION: 1. Distended urinary bladder with mild wall thickening suspicious for cystitis. Heterogeneous enhancement of both kidneys may represent pyelonephritis, however detailed evaluation is limited by streak artifact from spinal fusion hardware. 2. Small to moderate pericardial effusion. 3. Questionable nodular contours about the left lobe of the liver, recommend correlation for cirrhosis risk factors. Aortic Atherosclerosis (ICD10-I70.0). Electronically Signed   By: Keith Rake M.D.   On: 03/19/2019 23:56   Dg Chest Port 1 View  Result Date:  03/19/2019 CLINICAL DATA:  Sepsis.  Altered mental status. EXAM: PORTABLE CHEST 1 VIEW COMPARISON:  Chest x-ray dated March 16, 2019. FINDINGS: The heart size and mediastinal contours are within normal limits. Normal pulmonary vascularity. No focal consolidation, pleural effusion, or pneumothorax. No acute osseous abnormality. Unchanged thoracolumbar scoliosis status post Harrington rod fixation. IMPRESSION: No active disease. Electronically Signed   By: Titus Dubin M.D.   On: 03/19/2019 21:39        Scheduled Meds:  aspirin EC  81 mg Oral Daily   benztropine  1 mg Oral BID   enoxaparin (LOVENOX) injection  40 mg Subcutaneous Daily   ferrous [id number] C-folic acid  1 capsule Oral QHS   fluPHENAZine  20 mg Oral Daily   [START ON 03/22/2019] lisinopril  5 mg Oral Daily   lithium carbonate  150 mg Oral QHS   lithium carbonate  300 mg Oral Daily   loratadine  10 mg Oral Daily   LORazepam  0.5 mg Oral QID   lurasidone  120 mg Oral Daily   Melatonin  5 mg Oral QHS   [START ON 03/22/2019] metoprolol tartrate  12.5 mg Oral BID   polyethylene glycol  17 g Oral Daily   polyvinyl alcohol  1 drop Both Eyes TID   sertraline  100 mg Oral Daily   Continuous Infusions:  sodium chloride 75 mL/hr at 03/21/19 1144   cefTRIAXone (ROCEPHIN)  IV Stopped (03/20/19 2106)     LOS: 1 day    Time spent: 35 minutes    Irine Seal, MD [facility]  If 7PM-7AM, please contact night-coverage [email or web address] 03/21/2019, 12:28 PM

## 2019-03-22 LAB — CBC WITH DIFFERENTIAL/PLATELET
Abs Immature Granulocytes: 0.06 10*3/uL (ref 0.00–0.07)
Basophils Absolute: 0.1 10*3/uL (ref 0.0–0.1)
Basophils Relative: 1 %
Eosinophils Absolute: 0.4 10*3/uL (ref 0.0–0.5)
Eosinophils Relative: 5 %
HCT: 32.3 % — ABNORMAL LOW (ref 36.0–46.0)
Hemoglobin: 9.7 g/dL — ABNORMAL LOW (ref 12.0–15.0)
Immature Granulocytes: 1 %
Lymphocytes Relative: 22 %
Lymphs Abs: 1.8 10*3/uL (ref 0.7–4.0)
MCH: 30.5 pg (ref 26.0–34.0)
MCHC: 30 g/dL (ref 30.0–36.0)
MCV: 101.6 fL — ABNORMAL HIGH (ref 80.0–100.0)
Monocytes Absolute: 0.8 10*3/uL (ref 0.1–1.0)
Monocytes Relative: 10 %
Neutro Abs: 4.9 10*3/uL (ref 1.7–7.7)
Neutrophils Relative %: 61 %
Platelets: 316 10*3/uL (ref 150–400)
RBC: 3.18 MIL/uL — ABNORMAL LOW (ref 3.87–5.11)
RDW: 12.3 % (ref 11.5–15.5)
WBC: 8 10*3/uL (ref 4.0–10.5)
nRBC: 0 % (ref 0.0–0.2)

## 2019-03-22 LAB — BASIC METABOLIC PANEL
Anion gap: 5 (ref 5–15)
BUN: 11 mg/dL (ref 8–23)
CO2: 20 mmol/L — ABNORMAL LOW (ref 22–32)
Calcium: 9.3 mg/dL (ref 8.9–10.3)
Chloride: 114 mmol/L — ABNORMAL HIGH (ref 98–111)
Creatinine, Ser: 0.82 mg/dL (ref 0.44–1.00)
GFR calc Af Amer: >60 mL/min (ref >60)
GFR calc non Af Amer: >60 mL/min (ref >60)
Glucose, Bld: 82 mg/dL (ref 70–99)
Potassium: 4.8 mmol/L (ref 3.5–5.1)
Sodium: 139 mmol/L (ref 135–145)

## 2019-03-22 MED ORDER — PROCHLORPERAZINE EDISYLATE 10 MG/2ML IJ SOLN
5.0000 mg | Freq: Once | INTRAMUSCULAR | Status: AC
  Administered 2019-03-22: 5 mg via INTRAVENOUS
  Filled 2019-03-22: qty 2

## 2019-03-22 MED ORDER — KETOROLAC TROMETHAMINE 30 MG/ML IJ SOLN
30.0000 mg | Freq: Once | INTRAMUSCULAR | Status: AC
  Administered 2019-03-22: 30 mg via INTRAVENOUS
  Filled 2019-03-22: qty 1

## 2019-03-22 NOTE — Progress Notes (Addendum)
PROGRESS NOTE    Heidi Blevins  OZH:[id number] DOB: 10-20-1950 DOA: 03/19/2019 PCP: [name], MD   Brief Narrative:  HPI per [name] is a 68 y.o. female with medical history significant of anxiety, arrhythmia, bifascicular block, depression, hypertension, insomnia schizoaffective disorder, scoliosis, vitamin D deficiency who is brought to the emergency department from her facility due to [place].  The patient has had worsening mental status since she was discharged from [facility] and started on Keflex for UTI.  She has been weak and lethargic.  EMS stated the patient normally goes out to smoke, is usually alert and oriented.  She is unable to answer questions at this time and provide further history.  ED Course: Initial vital signs temperature 99 F, pulse 81, respirations 30, blood pressure 166/98 mmHg and O2 sat 97% on room air.  Patient was given a 1000 mL of LR bolus, Ativan 1 mg IVP and a gram of Rocephin IVPB.  Her urinalysis showed trace leukocyte esterase, otherwise was normal white count was 12.1, hemoglobin 11.7 g/dL and platelets 337.  PT was 15.0, INR 1.2 and APTT 38 seconds.  CMP shows a CO2 21 mmol/L.  Glucose of 118 and calcium of 10.7 mg/dL.  All other values are unremarkable.  Her lactic acid was normal.  Blood cultures x2 were drawn.  Urine culture was sent to the lab.  Imaging: CT head did not show any acute intracranial pathology.  CT abdomen/pelvis showed distended urinary bladder with mild wall thickening suspicious for cystitis.  There is heterogeneous enhancement of both kidneys which could represent pyelonephritis, however detailed evaluation is limited by streak active 5 from a spinal fusion hardware.  There is a small to moderate pericardial effusion.  Nodularity of the left lobe of liver.  Please see images and full radiology report for further details.  Assessment & Plan:   Principal Problem:   Sepsis secondary to UTI [facility]) Active Problems:  Hypercalcemia   Acute encephalopathy   Schizoaffective disorder (HCC)   Bifascicular block   Essential hypertension   Acute metabolic encephalopathy   1 sepsis secondary to UTI, POA Patient admitted with acute encephalopathy with lethargy and generalized weakness.  Patient recently diagnosed with a UTI and discharged from [facility] on Keflex.  Urinalysis done on admission with trace leukocytes, nitrite negative, 11-20 WBCs.  Patient with recent urinalysis on 03/16/2019 with urine cultures positive for E. coli which was sensitive to the cephalosporins, resistant to ciprofloxacin, sensitive to Zosyn, Bactrim, Macrobid, imipenem, gentamicin and resistant to ampicillin.  Patient more alert and improving clinically.  Repeat urine cultures could likely transition to oral antibiotics tomorrow.  Continue supportive care.  Follow.  2.  Acute metabolic encephalopathy Secondary to problem #1.  Patient also noted with complaints of auditory hallucinations.  Improving clinically.  Continue empiric IV Rocephin.  Follow.  3.  Schizoaffective disorder Patient with a history of schizoaffective disorder on psychiatric medications.  Patient with complaints of auditory hallucinations.  Continue current regimen of Cogentin, Prolixin, Latuda, lithium, Ativan, melatonin, Zoloft, Risperdal as needed.  Due to patient's auditory hallucinations psychiatry was consulted and in discussion with psychiatry was felt this was patient's baseline.  Outpatient follow-up with psychiatry.   4.  Bifascicular block 2D echo with normal EF and no wall motion abnormalities.  Outpatient follow-up.   5.  Hypertension Patient noted to have systolic blood pressures in the 90s on 03/21/2019.  Patient's antihypertensive medications were held which have been resumed.  Continue current  antihypertensive medications and uptitrate as needed for better blood pressure control.   6.  Hypercalcemia Likely secondary to dehydration.  Improved with  hydration.  Saline lock IV fluids.  Outpatient follow-up.  7.  Constipation Patient noted to have bowel movement overnight.  Continue current bowel regimen of MiraLAX 17 g daily and Senokot-S twice daily.     DVT prophylaxis: Lovenox Code Status: Full Family Communication: Updated patient.  No family at bedside. Disposition Plan: Back to facility/group home when clinically stable and on oral antibiotics hopefully on Monday, 03/24/2019.   Consultants:   Psychiatry: Dr. Mariea Clonts 03/21/2019  Procedures:   CT head 03/19/2019  CT abdomen and pelvis 03/19/2019  Chest x-ray 03/19/2019  2D echo 03/20/2019  Antimicrobials:   IV Rocephin 03/19/2019   Subjective: Patient sitting up in bed.  Denies any chest pain or shortness of breath.  Feeling clinically better.  Patient noted to have had bowel movement overnight.  Patient still with auditory hallucinations.   Objective: Vitals:   03/21/19 0950 03/21/19 1340 03/21/19 2100 03/22/19 0546  BP: 140/70 (!) 155/85 (!) 151/93 (!) 167/88  Pulse:  72 64 71  Resp:  (!) 21 16 17   Temp:  98.6 F (37 C) 99.3 F (37.4 C) 98.4 F (36.9 C)  TempSrc:  Oral Oral Oral  SpO2:  98% 99% 98%  Weight:      Height:        Intake/Output Summary (Last 24 hours) at 03/22/2019 1307 Last data filed at 03/22/2019 0940 Gross per 24 hour  Intake 240 ml  Output 725 ml  Net -485 ml   Filed Weights   03/20/19 1602  Weight: 61 kg    Examination:  General exam: NAD Respiratory system: Lungs clear to auscultation bilaterally.  No wheezes, no crackles, no rhonchi.  Normal respiratory effort.  Cardiovascular system: Regular rate rhythm no murmurs rubs or gallops.  No JVD.  No lower extremity edema. Gastrointestinal system: Abdomen is soft, nontender, less distended, positive bowel sounds.  No rebound.  No guarding.  Central nervous system: Alert and oriented. No focal neurological deficits. Extremities: Symmetric 5 x 5 power. Skin: No rashes, lesions or  ulcers Psychiatry: Judgement and insight appear normal. Mood & affect appropriate.     Data Reviewed: I have personally reviewed following labs and imaging studies  CBC: Recent Labs  Lab 03/16/19 0650 03/19/19 2142 03/20/19 0500 03/21/19 0558 03/22/19 0628  WBC 16.5* 12.1* 12.8* 11.0* 8.0  NEUTROABS  --  9.1* 9.6* 7.8* 4.9  HGB 12.0 11.7* 11.1* 10.5* 9.7*  HCT 37.9 37.6 36.0 33.6* 32.3*  MCV 98.2 99.2 101.1* 99.7 101.6*  PLT 299 337 279 300 976   Basic Metabolic Panel: Recent Labs  Lab 03/16/19 0650 03/19/19 2142 03/20/19 1645 03/21/19 0558 03/22/19 0628  NA 138 140 138 138 139  K 4.3 4.1 3.7 4.4 4.8  CL 110 110 109 110 114*  CO2 21* 21* 18* 19* 20*  GLUCOSE 135* 118* 93 91 82  BUN 7* 13 11 8 11   CREATININE 0.96 0.69 0.67 0.64 0.82  CALCIUM 10.4* 10.7* 10.3 9.6 9.3  MG  --  2.3  --   --   --   PHOS  --  3.0  --   --   --    GFR: Estimated Creatinine Clearance: 59.1 mL/min (by C-G formula based on SCr of 0.82 mg/dL). Liver Function Tests: Recent Labs  Lab 03/19/19 2142  AST 14*  ALT 16  ALKPHOS 111  BILITOT  0.3  PROT 7.8  ALBUMIN 3.6   No results for input(s): LIPASE, AMYLASE in the last 168 hours. No results for input(s): AMMONIA in the last 168 hours. Coagulation Profile: Recent Labs  Lab 03/19/19 2142  INR 1.2   Cardiac Enzymes: No results for input(s): CKTOTAL, CKMB, CKMBINDEX, TROPONINI in the last 168 hours. BNP (last 3 results) No results for input(s): PROBNP in the last 8760 hours. HbA1C: No results for input(s): HGBA1C in the last 72 hours. CBG: Recent Labs  Lab 03/19/19 2122  GLUCAP 110*   Lipid Profile: No results for input(s): CHOL, HDL, LDLCALC, TRIG, CHOLHDL, LDLDIRECT in the last 72 hours. Thyroid Function Tests: No results for input(s): TSH, T4TOTAL, FREET4, T3FREE, THYROIDAB in the last 72 hours. Anemia Panel: No results for input(s): VITAMINB12, FOLATE, FERRITIN, TIBC, IRON, RETICCTPCT in the last 72 hours. Sepsis  Labs: Recent Labs  Lab 03/16/19 0724 03/16/19 0858 03/19/19 2142  PROCALCITON <0.10  --   --   LATICACIDVEN 1.4 0.8 0.8    Recent Results (from the past 240 hour(s))  SARS Coronavirus 2 (CEPHEID - Performed in [facility] hospital lab), Hosp Order     Status: None   Collection Time: 03/16/19  6:45 AM   Specimen: Nasopharyngeal Swab  Result Value Ref Range Status   SARS Coronavirus 2 NEGATIVE NEGATIVE Final    Comment: (NOTE) If result is NEGATIVE SARS-CoV-2 target nucleic acids are NOT DETECTED. The SARS-CoV-2 RNA is generally detectable in upper and lower  respiratory specimens during the acute phase of infection. The lowest  concentration of SARS-CoV-2 viral copies this assay can detect is 250  copies / mL. A negative result does not preclude SARS-CoV-2 infection  and should not be used as the sole basis for treatment or other  patient management decisions.  A negative result may occur with  improper specimen collection / handling, submission of specimen other  than nasopharyngeal swab, presence of viral mutation(s) within the  areas targeted by this assay, and inadequate number of viral copies  (<250 copies / mL). A negative result must be combined with clinical  observations, patient history, and epidemiological information. If result is POSITIVE SARS-CoV-2 target nucleic acids are DETECTED. The SARS-CoV-2 RNA is generally detectable in upper and lower  respiratory specimens dur ing the acute phase of infection.  Positive  results are indicative of active infection with SARS-CoV-2.  Clinical  correlation with patient history and other diagnostic information is  necessary to determine patient infection status.  Positive results do  not rule out bacterial infection or co-infection with other viruses. If result is PRESUMPTIVE POSTIVE SARS-CoV-2 nucleic acids MAY BE PRESENT.   A presumptive positive result was obtained on the submitted specimen  and confirmed on repeat  testing.  While 2019 novel coronavirus  (SARS-CoV-2) nucleic acids may be present in the submitted sample  additional confirmatory testing may be necessary for epidemiological  and / or clinical management purposes  to differentiate between  SARS-CoV-2 and other Sarbecovirus currently known to infect humans.  If clinically indicated additional testing with an alternate test  methodology [phone]) is advised. The SARS-CoV-2 RNA is generally  detectable in upper and lower respiratory sp ecimens during the acute  phase of infection. The expected result is Negative. Fact Sheet for Patients:  StrictlyIdeas.no Fact Sheet for Healthcare Providers: BankingDealers.co.za This test is not yet approved or cleared by the [place] FDA and has been authorized for detection and/or diagnosis of SARS-CoV-2 by FDA under an Emergency  Use Authorization (EUA).  This EUA will remain in effect (meaning this test can be used) for the duration of the COVID-19 declaration under Section 564(b)(1) of the Act, 21 U.S.C. section 360bbb-3(b)(1), unless the authorization is terminated or revoked sooner. Performed at [facility], [place] [address], [place], [place] [address]   Blood Culture (routine x 2)     Status: Abnormal   Collection Time: 03/16/19  7:28 AM   Specimen: BLOOD  Result Value Ref Range Status   Specimen Description BLOOD LEFT ANTECUBITAL  Final   Special Requests   Final    BOTTLES DRAWN AEROBIC AND ANAEROBIC Blood Culture adequate volume   Culture  Setup Time   Final    GRAM POSITIVE COCCI IN CLUSTERS ANAEROBIC BOTTLE ONLY CRITICAL RESULT CALLED TO, READ BACK BY AND VERIFIED WITH: R. Sunol, AT [place] 03/17/19 BY D.VANHOOK    Culture (A)  Final    STAPHYLOCOCCUS SPECIES (COAGULASE NEGATIVE) THE SIGNIFICANCE OF ISOLATING THIS ORGANISM FROM A SINGLE SET OF BLOOD CULTURES WHEN MULTIPLE SETS ARE DRAWN IS UNCERTAIN. PLEASE NOTIFY  THE MICROBIOLOGY DEPARTMENT WITHIN ONE WEEK IF SPECIATION AND SENSITIVITIES ARE REQUIRED. Performed at [facility], [place] [address], [place], [place] [address]    Report Status 03/19/2019 FINAL  Final  Blood Culture ID Panel (Reflexed)     Status: Abnormal   Collection Time: 03/16/19  7:28 AM  Result Value Ref Range Status   Enterococcus species NOT DETECTED NOT DETECTED Final   Listeria monocytogenes NOT DETECTED NOT DETECTED Final   Staphylococcus species DETECTED (A) NOT DETECTED Final    Comment: Methicillin (oxacillin) susceptible coagulase negative staphylococcus. Possible blood culture contaminant (unless isolated from more than one blood culture draw or clinical case suggests pathogenicity). No antibiotic treatment is indicated for blood  culture contaminants. CRITICAL RESULT CALLED TO, READ BACK BY AND VERIFIED WITH: R. WHITE RN , AT 0981 03/17/19 BY D. VANHOOK    Staphylococcus aureus (BCID) NOT DETECTED NOT DETECTED Final   Methicillin resistance NOT DETECTED NOT DETECTED Final   Streptococcus species NOT DETECTED NOT DETECTED Final   Streptococcus agalactiae NOT DETECTED NOT DETECTED Final   Streptococcus pneumoniae NOT DETECTED NOT DETECTED Final   Streptococcus pyogenes NOT DETECTED NOT DETECTED Final   Acinetobacter baumannii NOT DETECTED NOT DETECTED Final   Enterobacteriaceae species NOT DETECTED NOT DETECTED Final   Enterobacter cloacae complex NOT DETECTED NOT DETECTED Final   Escherichia coli NOT DETECTED NOT DETECTED Final   Klebsiella oxytoca NOT DETECTED NOT DETECTED Final   Klebsiella pneumoniae NOT DETECTED NOT DETECTED Final   Proteus species NOT DETECTED NOT DETECTED Final   Serratia marcescens NOT DETECTED NOT DETECTED Final   Haemophilus influenzae NOT DETECTED NOT DETECTED Final   Neisseria meningitidis NOT DETECTED NOT DETECTED Final   Pseudomonas aeruginosa NOT DETECTED NOT DETECTED Final   Candida albicans NOT DETECTED NOT DETECTED Final    Candida glabrata NOT DETECTED NOT DETECTED Final   Candida krusei NOT DETECTED NOT DETECTED Final   Candida parapsilosis NOT DETECTED NOT DETECTED Final   Candida tropicalis NOT DETECTED NOT DETECTED Final    Comment: Performed at [facility], [address] [place], [place] [address]  Blood Culture (routine x 2)     Status: None   Collection Time: 03/16/19  7:39 AM   Specimen: BLOOD  Result Value Ref Range Status   Specimen Description BLOOD RIGHT ANTECUBITAL  Final   Special Requests   Final  BOTTLES DRAWN AEROBIC AND ANAEROBIC Blood Culture adequate volume   Culture   Final    NO GROWTH 5 DAYS Performed at [facility], [place] [address], [place], [place] [address]    Report Status 03/21/2019 FINAL  Final  Urine culture     Status: Abnormal   Collection Time: 03/16/19 10:31 AM   Specimen: Urine, Random  Result Value Ref Range Status   Specimen Description URINE, RANDOM  Final   Special Requests   Final    NONE Performed at [facility], [place] [address] [place], [place] [address]    Culture >=100,000 COLONIES/mL ESCHERICHIA COLI (A)  Final   Report Status 03/18/2019 FINAL  Final   Organism ID, Bacteria ESCHERICHIA COLI (A)  Final      Susceptibility   Escherichia coli - MIC*    AMPICILLIN >=32 RESISTANT Resistant     CEFAZOLIN <=4 SENSITIVE Sensitive     CEFTRIAXONE <=1 SENSITIVE Sensitive     CIPROFLOXACIN >=4 RESISTANT Resistant     GENTAMICIN <=1 SENSITIVE Sensitive     IMIPENEM <=0.25 SENSITIVE Sensitive     NITROFURANTOIN <=16 SENSITIVE Sensitive     TRIMETH/SULFA <=20 SENSITIVE Sensitive     AMPICILLIN/SULBACTAM 16 INTERMEDIATE Intermediate     PIP/TAZO <=4 SENSITIVE Sensitive     Extended ESBL NEGATIVE Sensitive     * >=100,000 COLONIES/mL ESCHERICHIA COLI  Blood Culture (routine x 2)     Status: None (Preliminary result)   Collection Time: 03/19/19  9:42 PM   Specimen: BLOOD  Result Value Ref Range Status   Specimen Description   Final     BLOOD RIGHT ANTECUBITAL Performed at [place] [address] [place], [place] [address]    Special Requests   Final    BOTTLES DRAWN AEROBIC AND ANAEROBIC Blood Culture adequate volume Performed at [place] [address] [place], [place] [address]    Culture   Final    NO GROWTH 3 DAYS Performed at [facility], [place] [address], [place], [place] [address]    Report Status PENDING  Incomplete  Blood Culture (routine x 2)     Status: None (Preliminary result)   Collection Time: 03/19/19  9:45 PM   Specimen: BLOOD  Result Value Ref Range Status   Specimen Description   Final    BLOOD LEFT ANTECUBITAL Performed at [place] [address] [place], [place] [address]    Special Requests   Final    BOTTLES DRAWN AEROBIC AND ANAEROBIC Blood Culture adequate volume Performed at [place] [address] [place], [place] [address]    Culture   Final    NO GROWTH 2 DAYS Performed at [place] [address], [place], [place] [address]    Report Status PENDING  Incomplete  Urine culture     Status: None   Collection Time: 03/19/19 10:19 PM   Specimen: In/Out Cath Urine  Result Value Ref Range Status   Specimen Description   Final    IN/OUT CATH URINE Performed at [place] [address] [place], [place] [address]    Special Requests   Final    NONE Performed at [facility], [place] [address], [place], [place] [address]    Culture   Final    NO GROWTH Performed at [facility], [place] [address], [place], [place] [address]  Report Status 03/20/2019 FINAL  Final  SARS Coronavirus 2 (CEPHEID - Performed in [place] hospital lab), Hosp Order     Status: None   Collection Time: 03/19/19 11:29 PM   Specimen: Nasopharyngeal Swab  Result Value Ref Range Status   SARS Coronavirus 2 NEGATIVE NEGATIVE Final    Comment:  (NOTE) If result is NEGATIVE SARS-CoV-2 target nucleic acids are NOT DETECTED. The SARS-CoV-2 RNA is generally detectable in upper and lower  respiratory specimens during the acute phase of infection. The lowest  concentration of SARS-CoV-2 viral copies this assay can detect is 250  copies / mL. A negative result does not preclude SARS-CoV-2 infection  and should not be used as the sole basis for treatment or other  patient management decisions.  A negative result may occur with  improper specimen collection / handling, submission of specimen other  than nasopharyngeal swab, presence of viral mutation(s) within the  areas targeted by this assay, and inadequate number of viral copies  (<250 copies / mL). A negative result must be combined with clinical  observations, patient history, and epidemiological information. If result is POSITIVE SARS-CoV-2 target nucleic acids are DETECTED. The SARS-CoV-2 RNA is generally detectable in upper and lower  respiratory specimens dur ing the acute phase of infection.  Positive  results are indicative of active infection with SARS-CoV-2.  Clinical  correlation with patient history and other diagnostic information is  necessary to determine patient infection status.  Positive results do  not rule out bacterial infection or co-infection with other viruses. If result is PRESUMPTIVE POSTIVE SARS-CoV-2 nucleic acids MAY BE PRESENT.   A presumptive positive result was obtained on the submitted specimen  and confirmed on repeat testing.  While 2019 novel coronavirus  (SARS-CoV-2) nucleic acids may be present in the submitted sample  additional confirmatory testing may be necessary for epidemiological  and / or clinical management purposes  to differentiate between  SARS-CoV-2 and other Sarbecovirus currently known to infect humans.  If clinically indicated additional testing with an alternate test  methodology [phone]) is advised. The SARS-CoV-2 RNA is  generally  detectable in upper and lower respiratory sp ecimens during the acute  phase of infection. The expected result is Negative. Fact Sheet for Patients:  StrictlyIdeas.no Fact Sheet for Healthcare Providers: BankingDealers.co.za This test is not yet approved or cleared by the [place] FDA and has been authorized for detection and/or diagnosis of SARS-CoV-2 by FDA under an Emergency Use Authorization (EUA).  This EUA will remain in effect (meaning this test can be used) for the duration of the COVID-19 declaration under Section 564(b)(1) of the Act, 21 U.S.C. section 360bbb-3(b)(1), unless the authorization is terminated or revoked sooner. Performed at [facility], [place] [address], [place], [place] [address]          Radiology Studies: No results found.      Scheduled Meds:  aspirin EC  81 mg Oral Daily   benztropine  1 mg Oral BID   enoxaparin (LOVENOX) injection  40 mg Subcutaneous Daily   ferrous [id number] C-folic acid  1 capsule Oral QHS   fluPHENAZine  20 mg Oral Daily   ketorolac  30 mg Intravenous Once   lisinopril  5 mg Oral Daily   lithium carbonate  150 mg Oral QHS   lithium carbonate  300 mg Oral Daily   loratadine  10 mg Oral Daily   LORazepam  0.5 mg Oral QID   lurasidone  120 mg Oral  Daily   Melatonin  5 mg Oral QHS   metoprolol tartrate  12.5 mg Oral BID   polyethylene glycol  17 g Oral Daily   polyvinyl alcohol  1 drop Both Eyes TID   prochlorperazine  5 mg Intravenous Once   senna-docusate  1 tablet Oral BID   sertraline  100 mg Oral Daily   Continuous Infusions:  sodium chloride 50 mL/hr (03/22/19 0935)   cefTRIAXone (ROCEPHIN)  IV 1 g (03/21/19 2246)     LOS: 2 days    Time spent: 35 minutes    Irine Seal, MD [facility]  If 7PM-7AM, please contact night-coverage [email or web address] 03/22/2019, 1:07 PM

## 2019-03-23 LAB — CBC
HCT: 28.8 % — ABNORMAL LOW (ref 36.0–46.0)
Hemoglobin: 8.8 g/dL — ABNORMAL LOW (ref 12.0–15.0)
MCH: 30.9 pg (ref 26.0–34.0)
MCHC: 30.6 g/dL (ref 30.0–36.0)
MCV: 101.1 fL — ABNORMAL HIGH (ref 80.0–100.0)
Platelets: 329 10*3/uL (ref 150–400)
RBC: 2.85 MIL/uL — ABNORMAL LOW (ref 3.87–5.11)
RDW: 12.2 % (ref 11.5–15.5)
WBC: 7.4 10*3/uL (ref 4.0–10.5)
nRBC: 0 % (ref 0.0–0.2)

## 2019-03-23 LAB — BASIC METABOLIC PANEL
Anion gap: 5 (ref 5–15)
BUN: 11 mg/dL (ref 8–23)
CO2: 22 mmol/L (ref 22–32)
Calcium: 9.7 mg/dL (ref 8.9–10.3)
Chloride: 113 mmol/L — ABNORMAL HIGH (ref 98–111)
Creatinine, Ser: 0.71 mg/dL (ref 0.44–1.00)
GFR calc Af Amer: >60 mL/min (ref >60)
GFR calc non Af Amer: >60 mL/min (ref >60)
Glucose, Bld: 78 mg/dL (ref 70–99)
Potassium: 4.2 mmol/L (ref 3.5–5.1)
Sodium: 140 mmol/L (ref 135–145)

## 2019-03-23 MED ORDER — PROCHLORPERAZINE EDISYLATE 10 MG/2ML IJ SOLN
5.0000 mg | Freq: Four times a day (QID) | INTRAMUSCULAR | Status: DC | PRN
  Administered 2019-03-23: 11:00:00 5 mg via INTRAVENOUS
  Filled 2019-03-23: qty 2

## 2019-03-23 MED ORDER — CEFDINIR 300 MG PO CAPS
300.0000 mg | ORAL_CAPSULE | Freq: Two times a day (BID) | ORAL | Status: DC
  Administered 2019-03-23 – 2019-03-25 (×5): 300 mg via ORAL
  Filled 2019-03-23 (×6): qty 1

## 2019-03-23 MED ORDER — KETOROLAC TROMETHAMINE 30 MG/ML IJ SOLN
30.0000 mg | Freq: Four times a day (QID) | INTRAMUSCULAR | Status: DC | PRN
  Administered 2019-03-23: 11:00:00 30 mg via INTRAVENOUS
  Filled 2019-03-23: qty 1

## 2019-03-23 NOTE — Evaluation (Signed)
Physical Therapy Evaluation Patient Details Name: Heidi Blevins MRN: [id number] DOB: 30-Oct-1950 Today's Date: 03/23/2019   History of Present Illness  Pt admitted from ALF with sepsis and acute encephalopathy 2* UTI.  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  Pt would benefit from follow HHPT at ALF.    Follow Up Recommendations Home health PT    Equipment Recommendations  None recommended by PT    Recommendations for Other Services       Precautions / Restrictions Precautions Precautions: Fall Restrictions Weight Bearing Restrictions: No      Mobility  Bed Mobility Overal bed mobility: Needs Assistance Bed Mobility: Supine to Sit     Supine to sit: Supervision     General bed mobility comments: Supine to sit unassisted to sit at EOB  Transfers Overall transfer level: Needs assistance Equipment used: Rolling walker (2 wheeled) Transfers: Sit to/from Stand Sit to Stand: Min assist         General transfer comment: cues for use of UEs to self assist.  Assist to bring wt up and fwd and to balance in intial standing  Ambulation/Gait Ambulation/Gait assistance: Min assist Gait Distance (Feet): 68 Feet Assistive device: Rolling walker (2 wheeled) Gait Pattern/deviations: Step-to pattern;Step-through pattern;Decreased step length - right;Decreased step length - left;Shuffle;Trunk flexed Gait velocity: decr   General Gait Details: cues for posture and position from RW; physical assist for stability and to correct for R drift  Stairs            Wheelchair Mobility    Modified Rankin (Stroke Patients Only)       Balance Overall balance assessment: Needs assistance Sitting-balance support: No upper extremity  supported;Feet supported Sitting balance-Leahy Scale: Good     Standing balance support: Bilateral upper extremity supported Standing balance-Leahy Scale: Poor                               Pertinent Vitals/Pain Pain Assessment: No/denies pain    Home Living Family/patient expects to be discharged to:: Assisted living               Home Equipment: Walker - 2 wheels;Wheelchair - manual      Prior Function Level of Independence: Needs assistance   Gait / Transfers Assistance Needed: Pt states she ambulates with RW until she gets tired and then she rides the [facility]  ADL's / Homemaking Assistance Needed: reports that staff helped her with bathing and dressing        Hand Dominance        Extremity/Trunk Assessment   Upper Extremity Assessment Upper Extremity Assessment: Defer to OT evaluation    Lower Extremity Assessment Lower Extremity Assessment: Generalized weakness       Communication   Communication: No difficulties  Cognition Arousal/Alertness: Awake/alert Behavior During Therapy: WFL for tasks assessed/performed Overall Cognitive Status: Within Functional Limits for tasks assessed                                        General Comments      Exercises     Assessment/Plan    PT Assessment Patient needs continued PT services  PT  Problem List Decreased strength;Decreased activity tolerance;Decreased balance;Decreased mobility;Decreased knowledge of use of DME       PT Treatment Interventions DME instruction;Gait training;Functional mobility training;Therapeutic activities;Therapeutic exercise;Balance training;Patient/family education    PT Goals (Current goals can be found in the Care Plan section)  Acute Rehab PT Goals Patient Stated Goal: No goals expressed but pt agreeable to participate with PT PT Goal Formulation: With patient Time For Goal Achievement: 04/05/19 Potential to Achieve Goals: Good    Frequency  Min 3X/week   Barriers to discharge        Co-evaluation PT/OT/SLP Co-Evaluation/Treatment: Yes Reason for Co-Treatment: For patient/therapist safety PT goals addressed during session: Mobility/safety with mobility OT goals addressed during session: ADL's and self-care       AM-PAC PT "6 Clicks" Mobility  Outcome Measure Help needed turning from your back to your side while in a flat bed without using bedrails?: None Help needed moving from lying on your back to sitting on the side of a flat bed without using bedrails?: None Help needed moving to and from a bed to a chair (including a wheelchair)?: A Little Help needed standing up from a chair using your arms (e.g., wheelchair or bedside chair)?: A Little Help needed to walk in hospital room?: A Little Help needed climbing 3-5 steps with a railing? : A Lot 6 Click Score: 19    End of Session Equipment Utilized During Treatment: Gait belt Activity Tolerance: Patient tolerated treatment well;Patient limited by fatigue Patient left: in chair;with call bell/phone within reach;with chair alarm set Nurse Communication: Mobility status PT Visit Diagnosis: Difficulty in walking, not elsewhere classified (R26.2);Muscle weakness (generalized) (M62.81)    Time: 1834-3735 PT Time Calculation (min) (ACUTE ONLY): 18 min   Charges:   PT Evaluation $PT Eval Low Complexity: 1 Low          [place] Pager [phone] Office [phone]    Raysha Tilmon 03/23/2019, 4:22 PM

## 2019-03-23 NOTE — Progress Notes (Signed)
PROGRESS NOTE    Heidi Blevins  CZY:[id number] DOB: [birth year] DOA: 03/19/2019 PCP: [name], MD   Brief Narrative:  HPI per [name] is a 68 y.o. female with medical history significant of anxiety, arrhythmia, bifascicular block, depression, hypertension, insomnia schizoaffective disorder, scoliosis, vitamin D deficiency who is brought to the emergency department from her facility due to [place].  The patient has had worsening mental status since she was discharged from [facility] and started on Keflex for UTI.  She has been weak and lethargic.  EMS stated the patient normally goes out to smoke, is usually alert and oriented.  She is unable to answer questions at this time and provide further history.  ED Course: Initial vital signs temperature 99 F, pulse 81, respirations 30, blood pressure 166/98 mmHg and O2 sat 97% on room air.  Patient was given a 1000 mL of LR bolus, Ativan 1 mg IVP and a gram of Rocephin IVPB.  Her urinalysis showed trace leukocyte esterase, otherwise was normal white count was 12.1, hemoglobin 11.7 g/dL and platelets 337.  PT was 15.0, INR 1.2 and APTT 38 seconds.  CMP shows a CO2 21 mmol/L.  Glucose of 118 and calcium of 10.7 mg/dL.  All other values are unremarkable.  Her lactic acid was normal.  Blood cultures x2 were drawn.  Urine culture was sent to the lab.  Imaging: CT head did not show any acute intracranial pathology.  CT abdomen/pelvis showed distended urinary bladder with mild wall thickening suspicious for cystitis.  There is heterogeneous enhancement of both kidneys which could represent pyelonephritis, however detailed evaluation is limited by streak active 5 from a spinal fusion hardware.  There is a small to moderate pericardial effusion.  Nodularity of the left lobe of liver.  Please see images and full radiology report for further details.  Assessment & Plan:   Principal Problem:   Sepsis secondary to UTI [facility]) Active Problems:  Hypercalcemia   Acute encephalopathy   Schizoaffective disorder (HCC)   Bifascicular block   Essential hypertension   Acute metabolic encephalopathy   1 sepsis secondary to UTI, POA Patient admitted with acute encephalopathy with lethargy and generalized weakness.  Patient recently diagnosed with a UTI and discharged from [facility] on Keflex.  Urinalysis done on admission with trace leukocytes, nitrite negative, 11-20 WBCs.  Patient with recent urinalysis on 03/16/2019 with urine cultures positive for E. coli which was sensitive to the cephalosporins, resistant to ciprofloxacin, sensitive to Zosyn, Bactrim, Macrobid, imipenem, gentamicin and resistant to ampicillin.  Patient more alert and improving clinically.  Repeat urine cultures with no growth to date.  Will transition patient from IV Rocephin to oral antibiotics of Omnicef to complete a course of antibiotic treatment.  2.  Acute metabolic encephalopathy Secondary to problem #1.  Patient also noted with complaints of auditory hallucinations.  Improving clinically.  Continue empiric antibiotics for UTI.  Follow.  3.  Schizoaffective disorder Patient with a history of schizoaffective disorder on psychiatric medications.  Patient with complaints of auditory hallucinations.  Continue current regimen of Cogentin, Prolixin, Latuda, lithium, Ativan, melatonin, Zoloft, Risperdal as needed.  Due to patient's auditory hallucinations psychiatry was consulted and in discussion with psychiatry was felt this was patient's baseline.  Outpatient follow-up with psychiatry.   4.  Bifascicular block 2D echo with normal EF and no wall motion abnormalities.  Outpatient follow-up.   5.  Hypertension Patient noted to have systolic blood pressures in the 90s on 03/21/2019.  Patient's antihypertensive medications were held which have been resumed.  Continue current antihypertensive medications and uptitrate as needed for better blood pressure control.   6.   Hypercalcemia Likely secondary to dehydration.  Improved with hydration.  Saline lock IV fluids.  Outpatient follow-up.  7.  Constipation Continue current bowel regimen with MiraLAX 17 g daily and Senokot-S twice daily.     DVT prophylaxis: Lovenox Code Status: Full Family Communication: Updated patient.  No family at bedside. Disposition Plan: Back to facility/group home when clinically stable and on oral antibiotics hopefully on Monday, 03/24/2019.   Consultants:   Psychiatry: Dr. Mariea Clonts 03/21/2019  Procedures:   CT head 03/19/2019  CT abdomen and pelvis 03/19/2019  Chest x-ray 03/19/2019  2D echo 03/20/2019  Antimicrobials:   IV Rocephin 03/19/2019>>>>> 03/23/2019  Omnicef 03/23/2019   Subjective: Patient sitting up in bed.  Denies any chest pain or shortness of breath.  Per RN patient with some complaints of headache this morning.  Patient states auditory hallucinations slowly improving.   Objective: Vitals:   03/22/19 2024 03/22/19 2235 03/23/19 0539 03/23/19 0820  BP: (!) 176/81 (!) 163/85 140/65 (!) 148/81  Pulse: (!) 48 (!) 54 (!) 51 66  Resp: 18  19   Temp: 98.8 F (37.1 C)  97.8 F (36.6 C)   TempSrc: Oral  Oral   SpO2: 96%  96%   Weight:      Height:        Intake/Output Summary (Last 24 hours) at 03/23/2019 1214 Last data filed at 03/23/2019 0820 Gross per 24 hour  Intake 1660 ml  Output 200 ml  Net 1460 ml   Filed Weights   03/20/19 1602  Weight: 61 kg    Examination:  General exam: NAD Respiratory system: CTA B.  No wheezes, no crackles, no rhonchi.  Normal respiratory effort.  Cardiovascular system: RRR no murmurs rubs or gallops.  No JVD.  No lower extremity edema.  Gastrointestinal system: Abdomen is nontender, nondistended, soft, positive bowel sounds.  No rebound.  No guarding.  Central nervous system: Alert and oriented. No focal neurological deficits. Extremities: Symmetric 5 x 5 power. Skin: No rashes, lesions or ulcers Psychiatry:  Judgement and insight appear normal. Mood & affect appropriate.     Data Reviewed: I have personally reviewed following labs and imaging studies  CBC: Recent Labs  Lab 03/19/19 2142 03/20/19 0500 03/21/19 0558 03/22/19 0628 03/23/19 0558  WBC 12.1* 12.8* 11.0* 8.0 7.4  NEUTROABS 9.1* 9.6* 7.8* 4.9  --   HGB 11.7* 11.1* 10.5* 9.7* 8.8*  HCT 37.6 36.0 33.6* 32.3* 28.8*  MCV 99.2 101.1* 99.7 101.6* 101.1*  PLT 337 279 300 316 242   Basic Metabolic Panel: Recent Labs  Lab 03/19/19 2142 03/20/19 1645 03/21/19 0558 03/22/19 0628 03/23/19 0558  NA 140 138 138 139 140  K 4.1 3.7 4.4 4.8 4.2  CL 110 109 110 114* 113*  CO2 21* 18* 19* 20* 22  GLUCOSE 118* 93 91 82 78  BUN 13 11 8 11 11   CREATININE 0.69 0.67 0.64 0.82 0.71  CALCIUM 10.7* 10.3 9.6 9.3 9.7  MG 2.3  --   --   --   --   PHOS 3.0  --   --   --   --    GFR: Estimated Creatinine Clearance: 60.6 mL/min (by C-G formula based on SCr of 0.71 mg/dL). Liver Function Tests: Recent Labs  Lab 03/19/19 2142  AST 14*  ALT 16  ALKPHOS 111  BILITOT 0.3  PROT 7.8  ALBUMIN 3.6   No results for input(s): LIPASE, AMYLASE in the last 168 hours. No results for input(s): AMMONIA in the last 168 hours. Coagulation Profile: Recent Labs  Lab 03/19/19 2142  INR 1.2   Cardiac Enzymes: No results for input(s): CKTOTAL, CKMB, CKMBINDEX, TROPONINI in the last 168 hours. BNP (last 3 results) No results for input(s): PROBNP in the last 8760 hours. HbA1C: No results for input(s): HGBA1C in the last 72 hours. CBG: Recent Labs  Lab 03/19/19 2122  GLUCAP 110*   Lipid Profile: No results for input(s): CHOL, HDL, LDLCALC, TRIG, CHOLHDL, LDLDIRECT in the last 72 hours. Thyroid Function Tests: No results for input(s): TSH, T4TOTAL, FREET4, T3FREE, THYROIDAB in the last 72 hours. Anemia Panel: No results for input(s): VITAMINB12, FOLATE, FERRITIN, TIBC, IRON, RETICCTPCT in the last 72 hours. Sepsis Labs: Recent Labs  Lab  03/19/19 2142  LATICACIDVEN 0.8    Recent Results (from the past 240 hour(s))  SARS Coronavirus 2 (CEPHEID - Performed in [facility] hospital lab), Hosp Order     Status: None   Collection Time: 03/16/19  6:45 AM   Specimen: Nasopharyngeal Swab  Result Value Ref Range Status   SARS Coronavirus 2 NEGATIVE NEGATIVE Final    Comment: (NOTE) If result is NEGATIVE SARS-CoV-2 target nucleic acids are NOT DETECTED. The SARS-CoV-2 RNA is generally detectable in upper and lower  respiratory specimens during the acute phase of infection. The lowest  concentration of SARS-CoV-2 viral copies this assay can detect is 250  copies / mL. A negative result does not preclude SARS-CoV-2 infection  and should not be used as the sole basis for treatment or other  patient management decisions.  A negative result may occur with  improper specimen collection / handling, submission of specimen other  than nasopharyngeal swab, presence of viral mutation(s) within the  areas targeted by this assay, and inadequate number of viral copies  (<250 copies / mL). A negative result must be combined with clinical  observations, patient history, and epidemiological information. If result is POSITIVE SARS-CoV-2 target nucleic acids are DETECTED. The SARS-CoV-2 RNA is generally detectable in upper and lower  respiratory specimens dur ing the acute phase of infection.  Positive  results are indicative of active infection with SARS-CoV-2.  Clinical  correlation with patient history and other diagnostic information is  necessary to determine patient infection status.  Positive results do  not rule out bacterial infection or co-infection with other viruses. If result is PRESUMPTIVE POSTIVE SARS-CoV-2 nucleic acids MAY BE PRESENT.   A presumptive positive result was obtained on the submitted specimen  and confirmed on repeat testing.  While 2019 novel coronavirus  (SARS-CoV-2) nucleic acids may be present in the  submitted sample  additional confirmatory testing may be necessary for epidemiological  and / or clinical management purposes  to differentiate between  SARS-CoV-2 and other Sarbecovirus currently known to infect humans.  If clinically indicated additional testing with an alternate test  methodology [phone]) is advised. The SARS-CoV-2 RNA is generally  detectable in upper and lower respiratory sp ecimens during the acute  phase of infection. The expected result is Negative. Fact Sheet for Patients:  StrictlyIdeas.no Fact Sheet for Healthcare Providers: BankingDealers.co.za This test is not yet approved or cleared by the [place] FDA and has been authorized for detection and/or diagnosis of SARS-CoV-2 by FDA under an Emergency Use Authorization (EUA).  This EUA will remain in effect (meaning this test can  be used) for the duration of the COVID-19 declaration under Section 564(b)(1) of the Act, 21 U.S.C. section 360bbb-3(b)(1), unless the authorization is terminated or revoked sooner. Performed at [facility], [place] [address], [place], [place] [address]   Blood Culture (routine x 2)     Status: Abnormal   Collection Time: 03/16/19  7:28 AM   Specimen: BLOOD  Result Value Ref Range Status   Specimen Description BLOOD LEFT ANTECUBITAL  Final   Special Requests   Final    BOTTLES DRAWN AEROBIC AND ANAEROBIC Blood Culture adequate volume   Culture  Setup Time   Final    GRAM POSITIVE COCCI IN CLUSTERS ANAEROBIC BOTTLE ONLY CRITICAL RESULT CALLED TO, READ BACK BY AND VERIFIED WITH: R. Straughn, AT [place] 03/17/19 BY D.VANHOOK    Culture (A)  Final    STAPHYLOCOCCUS SPECIES (COAGULASE NEGATIVE) THE SIGNIFICANCE OF ISOLATING THIS ORGANISM FROM A SINGLE SET OF BLOOD CULTURES WHEN MULTIPLE SETS ARE DRAWN IS UNCERTAIN. PLEASE NOTIFY THE MICROBIOLOGY DEPARTMENT WITHIN ONE WEEK IF SPECIATION AND SENSITIVITIES ARE  REQUIRED. Performed at [facility], [place] [address] [place], [place] [address]    Report Status 03/19/2019 FINAL  Final  Blood Culture ID Panel (Reflexed)     Status: Abnormal   Collection Time: 03/16/19  7:28 AM  Result Value Ref Range Status   Enterococcus species NOT DETECTED NOT DETECTED Final   Listeria monocytogenes NOT DETECTED NOT DETECTED Final   Staphylococcus species DETECTED (A) NOT DETECTED Final    Comment: Methicillin (oxacillin) susceptible coagulase negative staphylococcus. Possible blood culture contaminant (unless isolated from more than one blood culture draw or clinical case suggests pathogenicity). No antibiotic treatment is indicated for blood  culture contaminants. CRITICAL RESULT CALLED TO, READ BACK BY AND VERIFIED WITH: R. WHITE RN , AT 7893 03/17/19 BY D. VANHOOK    Staphylococcus aureus (BCID) NOT DETECTED NOT DETECTED Final   Methicillin resistance NOT DETECTED NOT DETECTED Final   Streptococcus species NOT DETECTED NOT DETECTED Final   Streptococcus agalactiae NOT DETECTED NOT DETECTED Final   Streptococcus pneumoniae NOT DETECTED NOT DETECTED Final   Streptococcus pyogenes NOT DETECTED NOT DETECTED Final   Acinetobacter baumannii NOT DETECTED NOT DETECTED Final   Enterobacteriaceae species NOT DETECTED NOT DETECTED Final   Enterobacter cloacae complex NOT DETECTED NOT DETECTED Final   Escherichia coli NOT DETECTED NOT DETECTED Final   Klebsiella oxytoca NOT DETECTED NOT DETECTED Final   Klebsiella pneumoniae NOT DETECTED NOT DETECTED Final   Proteus species NOT DETECTED NOT DETECTED Final   Serratia marcescens NOT DETECTED NOT DETECTED Final   Haemophilus influenzae NOT DETECTED NOT DETECTED Final   Neisseria meningitidis NOT DETECTED NOT DETECTED Final   Pseudomonas aeruginosa NOT DETECTED NOT DETECTED Final   Candida albicans NOT DETECTED NOT DETECTED Final   Candida glabrata NOT DETECTED NOT DETECTED Final   Candida krusei NOT DETECTED NOT  DETECTED Final   Candida parapsilosis NOT DETECTED NOT DETECTED Final   Candida tropicalis NOT DETECTED NOT DETECTED Final    Comment: Performed at [facility], [address], [place], [place] [address]  Blood Culture (routine x 2)     Status: None   Collection Time: 03/16/19  7:39 AM   Specimen: BLOOD  Result Value Ref Range Status   Specimen Description BLOOD RIGHT ANTECUBITAL  Final   Special Requests   Final    BOTTLES DRAWN AEROBIC AND ANAEROBIC Blood Culture adequate volume  Culture   Final    NO GROWTH 5 DAYS Performed at [facility], [place] [address] [place], [place] [address]    Report Status 03/21/2019 FINAL  Final  Urine culture     Status: Abnormal   Collection Time: 03/16/19 10:31 AM   Specimen: Urine, Random  Result Value Ref Range Status   Specimen Description URINE, RANDOM  Final   Special Requests   Final    NONE Performed at [facility], [place] [address], [place], [place] [address]    Culture >=100,000 COLONIES/mL ESCHERICHIA COLI (A)  Final   Report Status 03/18/2019 FINAL  Final   Organism ID, Bacteria ESCHERICHIA COLI (A)  Final      Susceptibility   Escherichia coli - MIC*    AMPICILLIN >=32 RESISTANT Resistant     CEFAZOLIN <=4 SENSITIVE Sensitive     CEFTRIAXONE <=1 SENSITIVE Sensitive     CIPROFLOXACIN >=4 RESISTANT Resistant     GENTAMICIN <=1 SENSITIVE Sensitive     IMIPENEM <=0.25 SENSITIVE Sensitive     NITROFURANTOIN <=16 SENSITIVE Sensitive     TRIMETH/SULFA <=20 SENSITIVE Sensitive     AMPICILLIN/SULBACTAM 16 INTERMEDIATE Intermediate     PIP/TAZO <=4 SENSITIVE Sensitive     Extended ESBL NEGATIVE Sensitive     * >=100,000 COLONIES/mL ESCHERICHIA COLI  Blood Culture (routine x 2)     Status: None (Preliminary result)   Collection Time: 03/19/19  9:42 PM   Specimen: BLOOD  Result Value Ref Range Status   Specimen Description   Final    BLOOD RIGHT ANTECUBITAL Performed at [place]  [address] [place], [place] [address]    Special Requests   Final    BOTTLES DRAWN AEROBIC AND ANAEROBIC Blood Culture adequate volume Performed at [place] [address] [place], [place] [address]    Culture   Final    NO GROWTH 4 DAYS Performed at [facility], [place] [address], [place], [place] [address]    Report Status PENDING  Incomplete  Blood Culture (routine x 2)     Status: None (Preliminary result)   Collection Time: 03/19/19  9:45 PM   Specimen: BLOOD  Result Value Ref Range Status   Specimen Description   Final    BLOOD LEFT ANTECUBITAL Performed at [place] [address], [place], [place] [address]    Special Requests   Final    BOTTLES DRAWN AEROBIC AND ANAEROBIC Blood Culture adequate volume Performed at [place] [address] [place], [place] [address]    Culture   Final    NO GROWTH 3 DAYS Performed at [facility], [place] [address] [place], [place] [address]    Report Status PENDING  Incomplete  Urine culture     Status: None   Collection Time: 03/19/19 10:19 PM   Specimen: In/Out Cath Urine  Result Value Ref Range Status   Specimen Description   Final    IN/OUT CATH URINE Performed at [place] [address], [place], [place] [address]    Special Requests   Final    NONE Performed at [facility], [place] [address] [place] [address], [place], [place] [address]    Culture   Final    NO GROWTH Performed at [facility], [place] [address], [place],  [address]    Report Status 03/20/2019 FINAL  Final  SARS Coronavirus 2 (CEPHEID -  Performed in [facility] hospital lab), Hosp Order     Status: None   Collection Time: 03/19/19 11:29 PM   Specimen: Nasopharyngeal Swab  Result Value Ref Range Status   SARS Coronavirus 2 NEGATIVE NEGATIVE Final    Comment: (NOTE) If result is NEGATIVE SARS-CoV-2 target nucleic acids are NOT DETECTED. The  SARS-CoV-2 RNA is generally detectable in upper and lower  respiratory specimens during the acute phase of infection. The lowest  concentration of SARS-CoV-2 viral copies this assay can detect is 250  copies / mL. A negative result does not preclude SARS-CoV-2 infection  and should not be used as the sole basis for treatment or other  patient management decisions.  A negative result may occur with  improper specimen collection / handling, submission of specimen other  than nasopharyngeal swab, presence of viral mutation(s) within the  areas targeted by this assay, and inadequate number of viral copies  (<250 copies / mL). A negative result must be combined with clinical  observations, patient history, and epidemiological information. If result is POSITIVE SARS-CoV-2 target nucleic acids are DETECTED. The SARS-CoV-2 RNA is generally detectable in upper and lower  respiratory specimens dur ing the acute phase of infection.  Positive  results are indicative of active infection with SARS-CoV-2.  Clinical  correlation with patient history and other diagnostic information is  necessary to determine patient infection status.  Positive results do  not rule out bacterial infection or co-infection with other viruses. If result is PRESUMPTIVE POSTIVE SARS-CoV-2 nucleic acids MAY BE PRESENT.   A presumptive positive result was obtained on the submitted specimen  and confirmed on repeat testing.  While 2019 novel coronavirus  (SARS-CoV-2) nucleic acids may be present in the submitted sample  additional confirmatory testing may be necessary for epidemiological  and / or clinical management purposes  to differentiate between  SARS-CoV-2 and other Sarbecovirus currently known to infect humans.  If clinically indicated additional testing with an alternate test  methodology [phone]) is advised. The SARS-CoV-2 RNA is generally  detectable in upper and lower respiratory sp ecimens during the acute   phase of infection. The expected result is Negative. Fact Sheet for Patients:  StrictlyIdeas.no Fact Sheet for Healthcare Providers: BankingDealers.co.za This test is not yet approved or cleared by the [place] FDA and has been authorized for detection and/or diagnosis of SARS-CoV-2 by FDA under an Emergency Use Authorization (EUA).  This EUA will remain in effect (meaning this test can be used) for the duration of the COVID-19 declaration under Section 564(b)(1) of the Act, 21 U.S.C. section 360bbb-3(b)(1), unless the authorization is terminated or revoked sooner. Performed at [facility], [place] [address] [place], [place] [address]          Radiology Studies: No results found.      Scheduled Meds:  aspirin EC  81 mg Oral Daily   benztropine  1 mg Oral BID   cefdinir  300 mg Oral Q12H   enoxaparin (LOVENOX) injection  40 mg Subcutaneous Daily   ferrous [id number] C-folic acid  1 capsule Oral QHS   fluPHENAZine  20 mg Oral Daily   lisinopril  5 mg Oral Daily   lithium carbonate  150 mg Oral QHS   lithium carbonate  300 mg Oral Daily   loratadine  10 mg Oral Daily   LORazepam  0.5 mg Oral QID   lurasidone  120 mg Oral Daily   Melatonin  5 mg Oral QHS   metoprolol  tartrate  12.5 mg Oral BID   polyethylene glycol  17 g Oral Daily   polyvinyl alcohol  1 drop Both Eyes TID   senna-docusate  1 tablet Oral BID   sertraline  100 mg Oral Daily   Continuous Infusions:    LOS: 3 days    Time spent: 35 minutes    Irine Seal, MD [facility]  If 7PM-7AM, please contact night-coverage [email or web address] 03/23/2019, 12:14 PM

## 2019-03-23 NOTE — Evaluation (Signed)
Occupational Therapy Evaluation Patient Details Name: Heidi Blevins MRN: [id number] DOB: 1951-03-25 Today's Date: 03/23/2019    History of Present Illness Pt admitted from ALF with sepsis and acute encephalopathy 2* UTI.  Pt with hx of Schizoaffective disorder with auditory hallucinations, L THR s/p fx (1/20), Arrythmia and Bifasicular Block   Clinical Impression   Pt admitted with above diagnoses, general debility and decreased activity tolerance limiting ability to engage in BADL At desired level of ind. PTA pt lives in ALF And gets assist with BADL at baseline. Pt is supervision for bed mobility and min A for sit >Stand and toilet t/fs. Overall she shows generalized weakness and decreased activity tolerance with BADL. Recommend HHOT at d/c to help progress ind in BADL activity. Will continue to follow while acute.     Follow Up Recommendations  Home health OT    Equipment Recommendations  None recommended by OT    Recommendations for Other Services       Precautions / Restrictions Precautions Precautions: Fall Restrictions Weight Bearing Restrictions: No      Mobility Bed Mobility Overal bed mobility: Needs Assistance Bed Mobility: Supine to Sit     Supine to sit: Supervision     General bed mobility comments: Supine to sit unassisted to sit at EOB  Transfers Overall transfer level: Needs assistance Equipment used: Rolling walker (2 wheeled) Transfers: Sit to/from Stand Sit to Stand: Min assist         General transfer comment: cues for UE hand placement and assist for balance once standing    Balance Overall balance assessment: Needs assistance Sitting-balance support: No upper extremity supported;Feet supported Sitting balance-Leahy Scale: Good     Standing balance support: Bilateral upper extremity supported Standing balance-Leahy Scale: Poor                             ADL either performed or assessed with clinical judgement   ADL  Overall ADL's : Needs assistance/impaired Eating/Feeding: Set up;Sitting   Grooming: Set up;Sitting   Upper Body Bathing: Minimal assistance;Sitting   Lower Body Bathing: Moderate assistance;Sit to/from stand;Sitting/lateral leans   Upper Body Dressing : Minimal assistance;Sitting   Lower Body Dressing: Moderate assistance;Sitting/lateral leans;Sit to/from stand   Toilet Transfer: Minimal assistance;Ambulation;RW;BSC Toilet Transfer Details (indicate cue type and reason): BSC over toilet Toileting- Clothing Manipulation and Hygiene: Minimal assistance;Sit to/from stand;Sitting/lateral lean   Tub/ Shower Transfer: Minimal assistance;Shower seat;Ambulation;Rolling walker   Functional mobility during ADLs: Minimal assistance;Rolling walker General ADL Comments: min A for balance in BADL activity     Vision Patient Visual Report: No change from baseline       Perception     Praxis      Pertinent Vitals/Pain Pain Assessment: No/denies pain     Hand Dominance     Extremity/Trunk Assessment Upper Extremity Assessment Upper Extremity Assessment: Generalized weakness   Lower Extremity Assessment Lower Extremity Assessment: Defer to PT evaluation       Communication Communication Communication: No difficulties   Cognition Arousal/Alertness: Awake/alert Behavior During Therapy: WFL for tasks assessed/performed Overall Cognitive Status: Within Functional Limits for tasks assessed                                     General Comments       Exercises     Shoulder Instructions  Home Living Family/patient expects to be discharged to:: Assisted living                             Home Equipment: Gilford Rile - 2 wheels;Wheelchair - manual   Additional Comments: equipment needs met by ALF      Prior Functioning/Environment Level of Independence: Needs assistance  Gait / Transfers Assistance Needed: Pt states she ambulates with RW until she  gets tired and then she rides the [facility] ADL's / Homemaking Assistance Needed: reports that staff helped her with bathing and dressing            OT Problem List: Decreased strength;Decreased knowledge of use of DME or AE;Decreased activity tolerance;Impaired balance (sitting and/or standing)      OT Treatment/Interventions: Self-care/ADL training;Therapeutic exercise;Patient/family education;Balance training;Energy conservation;Therapeutic activities;DME and/or AE instruction    OT Goals(Current goals can be found in the care plan section) Acute Rehab OT Goals Patient Stated Goal: none stated OT Goal Formulation: With patient Time For Goal Achievement: 04/06/19 Potential to Achieve Goals: Good  OT Frequency: Min 2X/week   Barriers to D/C:            Co-evaluation PT/OT/SLP Co-Evaluation/Treatment: Yes Reason for Co-Treatment: For patient/therapist safety;Necessary to address cognition/behavior during functional activity PT goals addressed during session: Mobility/safety with mobility OT goals addressed during session: ADL's and self-care      AM-PAC OT "6 Clicks" Daily Activity     Outcome Measure Help from another person eating meals?: None Help from another person taking care of personal grooming?: A Little Help from another person toileting, which includes using toliet, bedpan, or urinal?: A Little Help from another person bathing (including washing, rinsing, drying)?: A Little Help from another person to put on and taking off regular upper body clothing?: None Help from another person to put on and taking off regular lower body clothing?: A Little 6 Click Score: 20   End of Session Equipment Utilized During Treatment: Gait belt;Rolling walker Nurse Communication: Mobility status  Activity Tolerance: Patient tolerated treatment well Patient left: in chair;with call bell/phone within reach;with chair alarm set  OT Visit Diagnosis: Other abnormalities of gait and  mobility (R26.89);Muscle weakness (generalized) (M62.81)                Time: 1783-7542 OT Time Calculation (min): 13 min Charges:  OT Evaluation $OT Eval Moderate Complexity: [place], MSOT, OTR/L Behavioral Health OT/ Acute Relief OT [facility] Office: [phone]  Zenovia Jarred 03/23/2019, 4:39 PM

## 2019-03-24 ENCOUNTER — Inpatient Hospital Stay (HOSPITAL_COMMUNITY): Payer: Medicare Other

## 2019-03-24 LAB — CULTURE, BLOOD (ROUTINE X 2)
Culture: NO GROWTH
Special Requests: ADEQUATE

## 2019-03-24 MED ORDER — LORAZEPAM 0.5 MG PO TABS
0.5000 mg | ORAL_TABLET | Freq: Two times a day (BID) | ORAL | Status: DC
  Administered 2019-03-24 – 2019-03-25 (×3): 0.5 mg via ORAL
  Filled 2019-03-24 (×3): qty 1

## 2019-03-24 MED ORDER — GUAIFENESIN ER 600 MG PO TB12
1200.0000 mg | ORAL_TABLET | Freq: Two times a day (BID) | ORAL | Status: DC
  Administered 2019-03-24 – 2019-03-25 (×3): 1200 mg via ORAL
  Filled 2019-03-24 (×3): qty 2

## 2019-03-24 NOTE — Progress Notes (Signed)
Nurse entered room to find patient in bed c/o SOB respirations even and non labored patient is lethargic but easily aroused and responds appropriately to questions. T 98.5 RR 16 BP 135/68 heart rate reading 44 on dinamap. Heart rate verified for 1 minute per nurse is 38 BPM. Dr Grandville Silos notified and made aware. Interventions implemented per orders. Will continue to monitor patient.

## 2019-03-24 NOTE — Care Management Important Message (Signed)
Important Message  Patient Details IM Letter given to Servando Snare SW to present to the Patient Name: Heidi Blevins MRN: [id number] Date of Birth: [birth year]   Medicare Important Message Given:  Yes     Kerin Salen 03/24/2019, 10:45 AM

## 2019-03-24 NOTE — NC FL2 (Addendum)
[place] LEVEL OF CARE SCREENING TOOL     IDENTIFICATION  Patient Name: Heidi Blevins Birthdate: 1950-10-21 Sex: female Admission Date (Current Location): 03/19/2019  [facility] and [place] Number:  Herbalist and Address:  [facility],  [place], [place]      Provider Number: [id number]  Attending Physician Name and Address:  Eugenie Filler, MD  Relative Name and Phone Number:       Current Level of Care: Hospital Recommended Level of Care: [place] Prior Approval Number:    Date Approved/Denied:   PASRR Number:    Discharge Plan: Other (Comment)(ALF)    Current Diagnoses: Patient Active Problem List   Diagnosis Date Noted  . Acute metabolic encephalopathy   . Sepsis secondary to UTI ([place]) 03/20/2019  . Closed displaced fracture of left femoral neck ([place]) 09/26/2018  . AKI (acute kidney injury) ([place]) 06/13/2016  . UTI (urinary tract infection) 06/13/2016  . Essential hypertension 06/13/2016  . Urinary tract infection without hematuria   . Complicated UTI (urinary tract infection) 05/03/2016  . Hypercalcemia 05/03/2016  . Dehydration 05/03/2016  . Acute encephalopathy 05/03/2016  . 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 Schizoaffective disorder ([place]) 05/03/2016  . Bifascicular block 05/03/2016    Orientation RESPIRATION BLADDER Height & Weight        Normal Continent Weight: 132 lb 0.9 oz (59.9 kg) Height:  5\' 5"  (165.1 cm)  BEHAVIORAL SYMPTOMS/MOOD NEUROLOGICAL BOWEL NUTRITION STATUS      Continent Diet(See dc summary)  AMBULATORY STATUS COMMUNICATION OF NEEDS Skin   Limited Assist Verbally Normal                       Personal Care Assistance Level of Assistance  Bathing, Feeding, Dressing Bathing Assistance: Limited assistance Feeding assistance: Independent Dressing Assistance: Limited assistance     Functional Limitations Info  Sight, Hearing, Speech Sight Info:  Adequate Hearing Info: Adequate Speech Info: Adequate    SPECIAL CARE FACTORS FREQUENCY  PT (By licensed PT), OT (By licensed OT)     PT Frequency: 3x/week OT Frequency: 2x/week            Contractures Contractures Info: Present    Additional Factors Info  Code Status, Allergies Code Status Info: Full Allergies Info: Sulfonamide Derivatives           Current Medications (03/24/2019):  This is the current hospital active medication list Current Facility-Administered Medications  Medication Dose Route Frequency Provider Last Rate Last Dose  . acetaminophen (TYLENOL) tablet 650 mg  650 mg Oral Q6H PRN Reubin Milan, MD   650 mg at 03/24/19 1100   Or  . acetaminophen (TYLENOL) suppository 650 mg  650 mg Rectal Q6H PRN Reubin Milan, MD      . aspirin EC tablet 81 mg  81 mg Oral Daily Reubin Milan, MD   81 mg at 03/24/19 1059  . benztropine (COGENTIN) tablet 1 mg  1 mg Oral BID Reubin Milan, MD   1 mg at 03/24/19 1059  . cefdinir (OMNICEF) capsule 300 mg  300 mg Oral Q12H Eugenie Filler, MD   300 mg at 03/24/19 1058  . enoxaparin (LOVENOX) injection 40 mg  40 mg Subcutaneous Daily Reubin Milan, MD   40 mg at 03/24/19 1058  . ferrous [id number] C-folic acid (TRINSICON / FOLTRIN) capsule 1 capsule  1 capsule Oral QHS Reubin Milan, MD   1  capsule at 03/23/19 2221  . fluPHENAZine (PROLIXIN) tablet 20 mg  20 mg Oral Daily Reubin Milan, MD   20 mg at 03/24/19 1058  . ketorolac (TORADOL) 30 MG/ML injection 30 mg  30 mg Intravenous Q6H PRN Eugenie Filler, MD   30 mg at 03/23/19 1129  . lisinopril (ZESTRIL) tablet 5 mg  5 mg Oral Daily Eugenie Filler, MD   5 mg at 03/24/19 1058  . lithium carbonate capsule 150 mg  150 mg Oral QHS Reubin Milan, MD   150 mg at 03/23/19 2222  . lithium carbonate capsule 300 mg  300 mg Oral Daily Reubin Milan, MD   300 mg at 03/24/19 1058  . loratadine (CLARITIN) tablet 10 mg   10 mg Oral Daily Reubin Milan, MD   10 mg at 03/24/19 1100  . LORazepam (ATIVAN) tablet 0.5 mg  0.5 mg Oral BID Eugenie Filler, MD   0.5 mg at 03/24/19 1107  . lurasidone (LATUDA) tablet 120 mg  120 mg Oral Daily Reubin Milan, MD   120 mg at 03/24/19 1058  . Melatonin TABS 5 mg  5 mg Oral QHS Reubin Milan, MD   5 mg at 03/23/19 2221  . polyethylene glycol (MIRALAX / GLYCOLAX) packet 17 g  17 g Oral Daily Reubin Milan, MD   17 g at 03/24/19 1058  . polyvinyl alcohol (LIQUIFILM TEARS) 1.4 % ophthalmic solution 1 drop  1 drop Both Eyes TID Reubin Milan, MD   1 drop at 03/23/19 2222  . prochlorperazine (COMPAZINE) injection 5 mg  5 mg Intravenous Q6H PRN Eugenie Filler, MD   5 mg at 03/23/19 1129  . senna-docusate (Senokot-S) tablet 1 tablet  1 tablet Oral BID Eugenie Filler, MD   1 tablet at 03/24/19 1058  . sertraline (ZOLOFT) tablet 100 mg  100 mg Oral Daily Reubin Milan, MD   100 mg at 03/24/19 1100     Discharge Medications: STOP taking these medications   cephALEXin 250 MG capsule Commonly known as: KEFLEX   HYDROcodone-acetaminophen 7.5-325 MG tablet Commonly known as: Norco   methocarbamol 500 MG tablet Commonly known as: ROBAXIN   metoprolol tartrate 25 MG tablet Commonly known as: LOPRESSOR     TAKE these medications   acetaminophen 500 MG tablet Commonly known as: TYLENOL Take 500 mg by mouth See admin instructions. Take 500 mg by mouth at 9 AM every morning and 650 mg every 4 hours as needed for pain/fever/headaches   aspirin 81 MG EC tablet Take 1 tablet (81 mg total) by mouth daily.   benztropine 1 MG tablet Commonly known as: COGENTIN Take 1 mg by mouth 2 (two) times daily.   cefdinir 300 MG capsule Commonly known as: OMNICEF Take 1 capsule (300 mg total) by mouth every 12 (twelve) hours for 2 days.   Cranberry 425 MG Caps Take 425 mg by mouth 2 (two) times a day.   Ferocon capsule Generic drug:  ferrous [id number] C-folic acid Take 1 capsule by mouth at bedtime.   fluPHENAZine 10 MG tablet Commonly known as: PROLIXIN Take 20 mg by mouth daily.   GaviLAX 17 GM/SCOOP powder Generic drug: polyethylene glycol powder Take 17 g by mouth daily. MIX INTO 8 OUNCES OF FLUID, MIX, AND DRINK   guaiFENesin 600 MG 12 hr tablet Commonly known as: MUCINEX Take 2 tablets (1,200 mg total) by mouth 2 (two) times daily for 3 days.  Latuda 120 MG Tabs Generic drug: Lurasidone HCl Take 120 mg by mouth daily.   lisinopril 10 MG tablet Commonly known as: ZESTRIL Take 1 tablet (10 mg total) by mouth daily. Hold if Systolic b/p <030 What changed:   medication strength  how much to take   lithium carbonate 150 MG capsule Take 150-300 mg by mouth See admin instructions. Take 300 mg(2 capsules) by mouth in the morning and 150 mg (1 capsule) at bedtime   loratadine 10 MG tablet Commonly known as: CLARITIN Take 10 mg by mouth daily.   LORazepam 0.5 MG tablet Commonly known as: ATIVAN Take 1 tablet (0.5 mg total) by mouth 4 (four) times daily.   Melatonin 5 MG Tabs Take 5 mg by mouth at bedtime.   NUTRITIONAL SHAKE PLUS PO Take 1 Container by mouth 3 (three) times daily.   Ensure Take 237 mLs by mouth 2 (two) times daily. Strawberry   risperiDONE microspheres 50 MG injection Commonly known as: RISPERDAL CONSTA Inject 50 mg into the muscle every 14 (fourteen) days.   senna-docusate 8.6-50 MG tablet Commonly known as: Senokot-S Take 1 tablet by mouth 2 (two) times daily.   sertraline 100 MG tablet Commonly known as: ZOLOFT Take 100 mg by mouth daily.   Systane Balance 0.6 % Soln Generic drug: Propylene Glycol Place 1 drop into both eyes 3 (three) times daily.   VAGISIL ANTI-ITCH MEDICATED EX Apply 1 application topically daily as needed (for vaginal itching). Apply vaginally once a day    Relevant Imaging Results:  Relevant Lab  Results:   Additional Information SSN: [id number] Return to ALF with home health PT/OT  Servando Snare, LCSW

## 2019-03-24 NOTE — Progress Notes (Signed)
PROGRESS NOTE    Heidi Blevins  EHM:[id number] DOB: 01-Mar-1951 DOA: 03/19/2019 PCP: [name], MD   Brief Narrative:  HPI per [name] is a 68 y.o. female with medical history significant of anxiety, arrhythmia, bifascicular block, depression, hypertension, insomnia schizoaffective disorder, scoliosis, vitamin D deficiency who is brought to the emergency department from her facility due to [place].  The patient has had worsening mental status since she was discharged from [facility] and started on Keflex for UTI.  She has been weak and lethargic.  EMS stated the patient normally goes out to smoke, is usually alert and oriented.  She is unable to answer questions at this time and provide further history.  ED Course: Initial vital signs temperature 99 F, pulse 81, respirations 30, blood pressure 166/98 mmHg and O2 sat 97% on room air.  Patient was given a 1000 mL of LR bolus, Ativan 1 mg IVP and a gram of Rocephin IVPB.  Her urinalysis showed trace leukocyte esterase, otherwise was normal white count was 12.1, hemoglobin 11.7 g/dL and platelets 337.  PT was 15.0, INR 1.2 and APTT 38 seconds.  CMP shows a CO2 21 mmol/L.  Glucose of 118 and calcium of 10.7 mg/dL.  All other values are unremarkable.  Her lactic acid was normal.  Blood cultures x2 were drawn.  Urine culture was sent to the lab.  Imaging: CT head did not show any acute intracranial pathology.  CT abdomen/pelvis showed distended urinary bladder with mild wall thickening suspicious for cystitis.  There is heterogeneous enhancement of both kidneys which could represent pyelonephritis, however detailed evaluation is limited by streak active 5 from a spinal fusion hardware.  There is a small to moderate pericardial effusion.  Nodularity of the left lobe of liver.  Please see images and full radiology report for further details.  Assessment & Plan:   Principal Problem:   Sepsis secondary to UTI [facility]) Active Problems:  Hypercalcemia   Acute encephalopathy   Schizoaffective disorder (HCC)   Bifascicular block   Essential hypertension   Acute metabolic encephalopathy   1 sepsis secondary to UTI, POA Patient admitted with acute encephalopathy with lethargy and generalized weakness.  Patient recently diagnosed with a UTI and discharged from [facility] on Keflex.  Urinalysis done on admission with trace leukocytes, nitrite negative, 11-20 WBCs.  Patient with recent urinalysis on 03/16/2019 with urine cultures positive for E. coli which was sensitive to the cephalosporins, resistant to ciprofloxacin, sensitive to Zosyn, Bactrim, Macrobid, imipenem, gentamicin and resistant to ampicillin.  Patient improving clinically.  Repeat urine cultures with no growth to date.  Patient has been transitioned from IV Rocephin to oral Omnicef.  Outpatient follow-up.    2.  Acute metabolic encephalopathy Secondary to problem #1.  Patient also noted with complaints of auditory hallucinations which she states is slowly improving. Continue empiric antibiotics for UTI.  Follow.  3.  Schizoaffective disorder Patient with a history of schizoaffective disorder on psychiatric medications.  Patient with complaints of auditory hallucinations.  Continue current regimen of Cogentin, Prolixin, Latuda, lithium, Ativan, melatonin, Zoloft, Risperdal as needed.  Due to patient's auditory hallucinations psychiatry was consulted and in discussion with psychiatry was felt this was patient's baseline.  Outpatient follow-up with psychiatry.   4.  Bifascicular block/bradycardia 2D echo with normal EF and no wall motion abnormalities.  Patient noted to have bradycardia this morning with heart rates in the 40s.  Patient with history of bifascicular block.  Will discontinue metoprolol  will not resume.  Outpatient follow-up.   5.  Hypertension Patient noted to have systolic blood pressures in the 90s on 03/21/2019.  Patient's antihypertensive medications were  held which have been resumed.  Patient noted to bradycardic with heart rates in the 40s this morning.  Will discontinue patient's metoprolol as patient with history of bifascicular block.  Continue lisinopril and uptitrate if needed for better blood pressure control.   6.  Hypercalcemia Likely secondary to dehydration.  Improved with hydration.  Saline lock IV fluids.  Outpatient follow-up.  7.  Constipation Continue current bowel regimen with MiraLAX 17 g daily and Senokot-S twice daily.     DVT prophylaxis: Lovenox Code Status: Full Family Communication: Updated patient.  No family at bedside. Disposition Plan: Back to facility/group home when clinically stable and on oral antibiotics hopefully tomorrow.     Consultants:   Psychiatry: Dr. Mariea Clonts 03/21/2019  Procedures:   CT head 03/19/2019  CT abdomen and pelvis 03/19/2019  Chest x-ray 03/19/2019  2D echo 03/20/2019  Antimicrobials:   IV Rocephin 03/19/2019>>>>> 03/23/2019  Omnicef 03/23/2019   Subjective: Patient laying in bed.  Patient with some complaints of congestion.  Patient denies any chest pain or shortness of breath.  Per RN patient noted to be bradycardic this morning with heart rates in the 40s and somewhat drowsy.  Patient however perked up later on in the day and heart rate improved into the 70s.  Patient states auditory hallucinations slowly improving.   Objective: Vitals:   03/23/19 2122 03/24/19 0500 03/24/19 0800 03/24/19 0900  BP: (!) 168/77 (!) 143/75 135/68   Pulse: (!) 46 (!) 47 (!) 46   Resp: 19 16 16    Temp: 98.4 F (36.9 C) 97.7 F (36.5 C) 98.6 F (37 C)   TempSrc:  Axillary Oral   SpO2: 97% 93% 95%   Weight:    59.9 kg  Height:        Intake/Output Summary (Last 24 hours) at 03/24/2019 1248 Last data filed at 03/23/2019 1700 Gross per 24 hour  Intake 240 ml  Output -  Net 240 ml   Filed Weights   03/20/19 1602 03/24/19 0900  Weight: 61 kg 59.9 kg    Examination:  General exam:  NAD Respiratory system: Lungs clear to auscultation bilaterally.  No wheezes, no crackles, no rhonchi.  Normal respiratory effort.  Cardiovascular system: Regular rate rhythm no murmurs rubs or gallops.  No JVD.  No lower extremity edema.  Gastrointestinal system: Abdomen is soft, nontender, nondistended, positive bowel sounds.  No rebound.  No guarding.    Central nervous system: Alert and oriented. No focal neurological deficits. Extremities: Symmetric 5 x 5 power. Skin: No rashes, lesions or ulcers Psychiatry: Judgement and insight appear normal. Mood & affect appropriate.     Data Reviewed: I have personally reviewed following labs and imaging studies  CBC: Recent Labs  Lab 03/19/19 2142 03/20/19 0500 03/21/19 0558 03/22/19 0628 03/23/19 0558  WBC 12.1* 12.8* 11.0* 8.0 7.4  NEUTROABS 9.1* 9.6* 7.8* 4.9  --   HGB 11.7* 11.1* 10.5* 9.7* 8.8*  HCT 37.6 36.0 33.6* 32.3* 28.8*  MCV 99.2 101.1* 99.7 101.6* 101.1*  PLT 337 279 300 316 188   Basic Metabolic Panel: Recent Labs  Lab 03/19/19 2142 03/20/19 1645 03/21/19 0558 03/22/19 0628 03/23/19 0558  NA 140 138 138 139 140  K 4.1 3.7 4.4 4.8 4.2  CL 110 109 110 114* 113*  CO2 21* 18* 19* 20* 22  GLUCOSE  118* 93 91 82 78  BUN 13 11 8 11 11   CREATININE 0.69 0.67 0.64 0.82 0.71  CALCIUM 10.7* 10.3 9.6 9.3 9.7  MG 2.3  --   --   --   --   PHOS 3.0  --   --   --   --    GFR: Estimated Creatinine Clearance: 60.6 mL/min (by C-G formula based on SCr of 0.71 mg/dL). Liver Function Tests: Recent Labs  Lab 03/19/19 2142  AST 14*  ALT 16  ALKPHOS 111  BILITOT 0.3  PROT 7.8  ALBUMIN 3.6   No results for input(s): LIPASE, AMYLASE in the last 168 hours. No results for input(s): AMMONIA in the last 168 hours. Coagulation Profile: Recent Labs  Lab 03/19/19 2142  INR 1.2   Cardiac Enzymes: No results for input(s): CKTOTAL, CKMB, CKMBINDEX, TROPONINI in the last 168 hours. BNP (last 3 results) No results for input(s):  PROBNP in the last 8760 hours. HbA1C: No results for input(s): HGBA1C in the last 72 hours. CBG: Recent Labs  Lab 03/19/19 2122  GLUCAP 110*   Lipid Profile: No results for input(s): CHOL, HDL, LDLCALC, TRIG, CHOLHDL, LDLDIRECT in the last 72 hours. Thyroid Function Tests: No results for input(s): TSH, T4TOTAL, FREET4, T3FREE, THYROIDAB in the last 72 hours. Anemia Panel: No results for input(s): VITAMINB12, FOLATE, FERRITIN, TIBC, IRON, RETICCTPCT in the last 72 hours. Sepsis Labs: Recent Labs  Lab 03/19/19 2142  LATICACIDVEN 0.8    Recent Results (from the past 240 hour(s))  SARS Coronavirus 2 (CEPHEID - Performed in [facility] hospital lab), Hosp Order     Status: None   Collection Time: 03/16/19  6:45 AM   Specimen: Nasopharyngeal Swab  Result Value Ref Range Status   SARS Coronavirus 2 NEGATIVE NEGATIVE Final    Comment: (NOTE) If result is NEGATIVE SARS-CoV-2 target nucleic acids are NOT DETECTED. The SARS-CoV-2 RNA is generally detectable in upper and lower  respiratory specimens during the acute phase of infection. The lowest  concentration of SARS-CoV-2 viral copies this assay can detect is 250  copies / mL. A negative result does not preclude SARS-CoV-2 infection  and should not be used as the sole basis for treatment or other  patient management decisions.  A negative result may occur with  improper specimen collection / handling, submission of specimen other  than nasopharyngeal swab, presence of viral mutation(s) within the  areas targeted by this assay, and inadequate number of viral copies  (<250 copies / mL). A negative result must be combined with clinical  observations, patient history, and epidemiological information. If result is POSITIVE SARS-CoV-2 target nucleic acids are DETECTED. The SARS-CoV-2 RNA is generally detectable in upper and lower  respiratory specimens dur ing the acute phase of infection.  Positive  results are indicative of active  infection with SARS-CoV-2.  Clinical  correlation with patient history and other diagnostic information is  necessary to determine patient infection status.  Positive results do  not rule out bacterial infection or co-infection with other viruses. If result is PRESUMPTIVE POSTIVE SARS-CoV-2 nucleic acids MAY BE PRESENT.   A presumptive positive result was obtained on the submitted specimen  and confirmed on repeat testing.  While 2019 novel coronavirus  (SARS-CoV-2) nucleic acids may be present in the submitted sample  additional confirmatory testing may be necessary for epidemiological  and / or clinical management purposes  to differentiate between  SARS-CoV-2 and other Sarbecovirus currently known to infect humans.  If clinically  indicated additional testing with an alternate test  methodology [phone]) is advised. The SARS-CoV-2 RNA is generally  detectable in upper and lower respiratory sp ecimens during the acute  phase of infection. The expected result is Negative. Fact Sheet for Patients:  StrictlyIdeas.no Fact Sheet for Healthcare Providers: BankingDealers.co.za This test is not yet approved or cleared by the [place] FDA and has been authorized for detection and/or diagnosis of SARS-CoV-2 by FDA under an Emergency Use Authorization (EUA).  This EUA will remain in effect (meaning this test can be used) for the duration of the COVID-19 declaration under Section 564(b)(1) of the Act, 21 U.S.C. section 360bbb-3(b)(1), unless the authorization is terminated or revoked sooner. Performed at [facility], [place] [address], [place], [place] [address]   Blood Culture (routine x 2)     Status: Abnormal   Collection Time: 03/16/19  7:28 AM   Specimen: BLOOD  Result Value Ref Range Status   Specimen Description BLOOD LEFT ANTECUBITAL  Final   Special Requests   Final    BOTTLES DRAWN AEROBIC AND ANAEROBIC Blood Culture adequate  volume   Culture  Setup Time   Final    GRAM POSITIVE COCCI IN CLUSTERS ANAEROBIC BOTTLE ONLY CRITICAL RESULT CALLED TO, READ BACK BY AND VERIFIED WITH: R. [place], AT [place] 03/17/19 BY D.VANHOOK    Culture (A)  Final    STAPHYLOCOCCUS SPECIES (COAGULASE NEGATIVE) THE SIGNIFICANCE OF ISOLATING THIS ORGANISM FROM A SINGLE SET OF BLOOD CULTURES WHEN MULTIPLE SETS ARE DRAWN IS UNCERTAIN. PLEASE NOTIFY THE MICROBIOLOGY DEPARTMENT WITHIN ONE WEEK IF SPECIATION AND SENSITIVITIES ARE REQUIRED. Performed at [facility], [place] [address], [place], [place] [address]    Report Status 03/19/2019 FINAL  Final  Blood Culture ID Panel (Reflexed)     Status: Abnormal   Collection Time: 03/16/19  7:28 AM  Result Value Ref Range Status   Enterococcus species NOT DETECTED NOT DETECTED Final   Listeria monocytogenes NOT DETECTED NOT DETECTED Final   Staphylococcus species DETECTED (A) NOT DETECTED Final    Comment: Methicillin (oxacillin) susceptible coagulase negative staphylococcus. Possible blood culture contaminant (unless isolated from more than one blood culture draw or clinical case suggests pathogenicity). No antibiotic treatment is indicated for blood  culture contaminants. CRITICAL RESULT CALLED TO, READ BACK BY AND VERIFIED WITH: R. WHITE RN , AT 7902 03/17/19 BY D. VANHOOK    Staphylococcus aureus (BCID) NOT DETECTED NOT DETECTED Final   Methicillin resistance NOT DETECTED NOT DETECTED Final   Streptococcus species NOT DETECTED NOT DETECTED Final   Streptococcus agalactiae NOT DETECTED NOT DETECTED Final   Streptococcus pneumoniae NOT DETECTED NOT DETECTED Final   Streptococcus pyogenes NOT DETECTED NOT DETECTED Final   Acinetobacter baumannii NOT DETECTED NOT DETECTED Final   Enterobacteriaceae species NOT DETECTED NOT DETECTED Final   Enterobacter cloacae complex NOT DETECTED NOT DETECTED Final   Escherichia coli NOT DETECTED NOT DETECTED Final   Klebsiella oxytoca NOT  DETECTED NOT DETECTED Final   Klebsiella pneumoniae NOT DETECTED NOT DETECTED Final   Proteus species NOT DETECTED NOT DETECTED Final   Serratia marcescens NOT DETECTED NOT DETECTED Final   Haemophilus influenzae NOT DETECTED NOT DETECTED Final   Neisseria meningitidis NOT DETECTED NOT DETECTED Final   Pseudomonas aeruginosa NOT DETECTED NOT DETECTED Final   Candida albicans NOT DETECTED NOT DETECTED Final   Candida glabrata NOT DETECTED NOT DETECTED Final   Candida krusei NOT DETECTED NOT DETECTED Final   Candida  parapsilosis NOT DETECTED NOT DETECTED Final   Candida tropicalis NOT DETECTED NOT DETECTED Final    Comment: Performed at [facility], [place] [address] [place], [place] [address]  Blood Culture (routine x 2)     Status: None   Collection Time: 03/16/19  7:39 AM   Specimen: BLOOD  Result Value Ref Range Status   Specimen Description BLOOD RIGHT ANTECUBITAL  Final   Special Requests   Final    BOTTLES DRAWN AEROBIC AND ANAEROBIC Blood Culture adequate volume   Culture   Final    NO GROWTH 5 DAYS Performed at [facility], [place] [address], [place], [place] [address]    Report Status 03/21/2019 FINAL  Final  Urine culture     Status: Abnormal   Collection Time: 03/16/19 10:31 AM   Specimen: Urine, Random  Result Value Ref Range Status   Specimen Description URINE, RANDOM  Final   Special Requests   Final    NONE Performed at [facility], [place] [address] [place], [place] [address]    Culture >=100,000 COLONIES/mL ESCHERICHIA COLI (A)  Final   Report Status 03/18/2019 FINAL  Final   Organism ID, Bacteria ESCHERICHIA COLI (A)  Final      Susceptibility   Escherichia coli - MIC*    AMPICILLIN >=32 RESISTANT Resistant     CEFAZOLIN <=4 SENSITIVE Sensitive     CEFTRIAXONE <=1 SENSITIVE Sensitive     CIPROFLOXACIN >=4 RESISTANT Resistant     GENTAMICIN <=1 SENSITIVE Sensitive     IMIPENEM <=0.25 SENSITIVE Sensitive     NITROFURANTOIN <=16 SENSITIVE  Sensitive     TRIMETH/SULFA <=20 SENSITIVE Sensitive     AMPICILLIN/SULBACTAM 16 INTERMEDIATE Intermediate     PIP/TAZO <=4 SENSITIVE Sensitive     Extended ESBL NEGATIVE Sensitive     * >=100,000 COLONIES/mL ESCHERICHIA COLI  Blood Culture (routine x 2)     Status: None   Collection Time: 03/19/19  9:42 PM   Specimen: BLOOD  Result Value Ref Range Status   Specimen Description   Final    BLOOD RIGHT ANTECUBITAL Performed at [place] [address], [place], [place] [address]    Special Requests   Final    BOTTLES DRAWN AEROBIC AND ANAEROBIC Blood Culture adequate volume Performed at [place] [address] [place], [place] [address]    Culture   Final    NO GROWTH 5 DAYS Performed at [facility], [place] [address], [place], [place] [address]    Report Status 03/24/2019 FINAL  Final  Blood Culture (routine x 2)     Status: None (Preliminary result)   Collection Time: 03/19/19  9:45 PM   Specimen: BLOOD  Result Value Ref Range Status   Specimen Description   Final    BLOOD LEFT ANTECUBITAL Performed at [place] [address], [place], [place] [address]    Special Requests   Final    BOTTLES DRAWN AEROBIC AND ANAEROBIC Blood Culture adequate volume Performed at [place] [address] [place], [place] [address]    Culture   Final    NO GROWTH 4 DAYS Performed at [facility], [place] [address] [place], [place] [address]    Report Status PENDING  Incomplete  Urine culture     Status: None   Collection Time: 03/19/19 10:19 PM   Specimen: In/Out Cath Urine  Result Value Ref Range  Status   Specimen Description   Final    IN/OUT CATH URINE Performed at [place] [address] [place], [place] [address]    Special Requests   Final    NONE Performed at [facility] [place] [address], [place], [place] [address]    Culture    Final    NO GROWTH Performed at [facility], [place] [address], [place], [place] [address]    Report Status 03/20/2019 FINAL  Final  SARS Coronavirus 2 (CEPHEID - Performed in [place] hospital lab), Hosp Order     Status: None   Collection Time: 03/19/19 11:29 PM   Specimen: Nasopharyngeal Swab  Result Value Ref Range Status   SARS Coronavirus 2 NEGATIVE NEGATIVE Final    Comment: (NOTE) If result is NEGATIVE SARS-CoV-2 target nucleic acids are NOT DETECTED. The SARS-CoV-2 RNA is generally detectable in upper and lower  respiratory specimens during the acute phase of infection. The lowest  concentration of SARS-CoV-2 viral copies this assay can detect is 250  copies / mL. A negative result does not preclude SARS-CoV-2 infection  and should not be used as the sole basis for treatment or other  patient management decisions.  A negative result may occur with  improper specimen collection / handling, submission of specimen other  than nasopharyngeal swab, presence of viral mutation(s) within the  areas targeted by this assay, and inadequate number of viral copies  (<250 copies / mL). A negative result must be combined with clinical  observations, patient history, and epidemiological information. If result is POSITIVE SARS-CoV-2 target nucleic acids are DETECTED. The SARS-CoV-2 RNA is generally detectable in upper and lower  respiratory specimens dur ing the acute phase of infection.  Positive  results are indicative of active infection with SARS-CoV-2.  Clinical  correlation with patient history and other diagnostic information is  necessary to determine patient infection status.  Positive results do  not rule out bacterial infection or co-infection with other viruses. If result is PRESUMPTIVE POSTIVE SARS-CoV-2 nucleic acids MAY BE PRESENT.   A presumptive positive result was obtained on the submitted specimen  and confirmed on repeat testing.  While 2019 novel coronavirus   (SARS-CoV-2) nucleic acids may be present in the submitted sample  additional confirmatory testing may be necessary for epidemiological  and / or clinical management purposes  to differentiate between  SARS-CoV-2 and other Sarbecovirus currently known to infect humans.  If clinically indicated additional testing with an alternate test  methodology [phone]) is advised. The SARS-CoV-2 RNA is generally  detectable in upper and lower respiratory sp ecimens during the acute  phase of infection. The expected result is Negative. Fact Sheet for Patients:  StrictlyIdeas.no Fact Sheet for Healthcare Providers: BankingDealers.co.za This test is not yet approved or cleared by the [place] FDA and has been authorized for detection and/or diagnosis of SARS-CoV-2 by FDA under an Emergency Use Authorization (EUA).  This EUA will remain in effect (meaning this test can be used) for the duration of the COVID-19 declaration under Section 564(b)(1) of the Act, 21 U.S.C. section 360bbb-3(b)(1), unless the authorization is terminated or revoked sooner. Performed at [facility], [place] [address] [place],  [address]          Radiology Studies: Dg Chest Port 1 View  Result Date: 03/24/2019 CLINICAL DATA:  68 year old female with shortness of breath. EXAM: PORTABLE CHEST 1 VIEW COMPARISON:  Portable chest 03/19/2019 and earlier. FINDINGS: Portable  AP semi upright view at 0855 hours. Pronounced dextroconvex thoracic scoliosis with posterior spinal rod. Stable cardiac size and mediastinal contours. Stable lung volumes and allowing for portable technique the lungs are clear. No pneumothorax. Negative visible bowel gas.  Stable visualized osseous structures. IMPRESSION: No acute cardiopulmonary abnormality. Electronically Signed   By: Genevie Ann M.D.   On: 03/24/2019 09:20        Scheduled Meds: . aspirin EC  81 mg Oral Daily  .  benztropine  1 mg Oral BID  . cefdinir  300 mg Oral Q12H  . enoxaparin (LOVENOX) injection  40 mg Subcutaneous Daily  . ferrous [id number] C-folic acid  1 capsule Oral QHS  . fluPHENAZine  20 mg Oral Daily  . guaiFENesin  1,200 mg Oral BID  . lisinopril  5 mg Oral Daily  . lithium carbonate  150 mg Oral QHS  . lithium carbonate  300 mg Oral Daily  . loratadine  10 mg Oral Daily  . LORazepam  0.5 mg Oral BID  . lurasidone  120 mg Oral Daily  . Melatonin  5 mg Oral QHS  . polyethylene glycol  17 g Oral Daily  . polyvinyl alcohol  1 drop Both Eyes TID  . senna-docusate  1 tablet Oral BID  . sertraline  100 mg Oral Daily   Continuous Infusions:    LOS: 4 days    Time spent: 35 minutes    Irine Seal, MD [facility]  If 7PM-7AM, please contact night-coverage [email or web address] 03/24/2019, 12:48 PM

## 2019-03-24 NOTE — TOC Initial Note (Signed)
Transition of Care [facility]) - Initial/Assessment Note    Patient Details  Name: Heidi Blevins MRN: [id number] Date of Birth: October 04, 1950  [facility] CM/SW Contact:    Servando Snare, LCSW Phone Number: 03/24/2019, 11:52 AM  Clinical Narrative:           Patient from [facility]F. At baseline patient uses a rolling walker and requires assistance with ADLs. PT/OT recommends HH at facility at dc.   LCSW will follow for patient return to facility.         Expected Discharge Plan: Assisted Living Barriers to Discharge: Continued Medical Work up   Patient Goals and CMS Choice     Choice offered to / list presented to : NA  Expected Discharge Plan and Services Expected Discharge Plan: Assisted Living In-house Referral: NA   Post Acute Care Choice: Nursing Home Living arrangements for the past 2 months: [place] Expected Discharge Date: (unknown)               DME Arranged: N/A DME Agency: NA       HH Arranged: PT, OT HH Agency: (Patient from [facility])        Prior Living Arrangements/Services Living arrangements for the past 2 months: [place] Lives with:: Facility Resident Patient language and need for interpreter reviewed:: Yes Do you feel safe going back to the place where you live?: Yes      Need for Family Participation in Patient Care: Yes (Comment) Care giver support system in place?: Yes (comment) Current home services: DME Criminal Activity/Legal Involvement Pertinent to Current Situation/Hospitalization: No - Comment as needed  Activities of Daily Living Home Assistive Devices/Equipment: Grab bars around toilet, Grab bars in shower, Hand-held shower hose, Hospital bed, Blood pressure cuff, Scales, Walker (specify type)(rolling walker-[facility] has necessary equipment for their residents) ADL Screening (condition at time of admission) Patient's cognitive ability adequate to safely complete daily  activities?: No Is the patient deaf or have difficulty hearing?: No Does the patient have difficulty seeing, even when wearing glasses/contacts?: No Does the patient have difficulty concentrating, remembering, or making decisions?: Yes Patient able to express need for assistance with ADLs?: No Does the patient have difficulty dressing or bathing?: Yes Independently performs ADLs?: No Communication: Independent Dressing (OT): Dependent Is this a change from baseline?: Change from baseline, expected to last >3 days Grooming: Dependent Is this a change from baseline?: Change from baseline, expected to last >3 days Feeding: Dependent Is this a change from baseline?: Change from baseline, expected to last >3 days Bathing: Dependent Is this a change from baseline?: Change from baseline, expected to last >3 days Toileting: Dependent Is this a change from baseline?: Change from baseline, expected to last >3days In/Out Bed: Dependent Is this a change from baseline?: Change from baseline, expected to last >3 days Walks in Home: Dependent Is this a change from baseline?: Change from baseline, expected to last >3 days Does the patient have difficulty walking or climbing stairs?: Yes(secondary to weakness) Weakness of Legs: Both Weakness of Arms/Hands: Both  Permission Sought/Granted Permission sought to share information with : Facility Sport and exercise psychologist, Family Supports Permission granted to share information with : Yes, Verbal Permission Granted  Share Information with NAME: Reece Agar and [place] granted to share info w AGENCY: Consulting civil engineer granted to share info w Relationship: Sons     Emotional Assessment Appearance:: Appears stated age Attitude/Demeanor/Rapport: Unable to Assess Affect (typically observed): Calm Orientation: :  Oriented to Self, Oriented to Place, Oriented to  Time, Oriented to Situation Alcohol / Substance Use: Not Applicable Psych  Involvement: No (comment)  Admission diagnosis:  Urinary tract infection without hematuria, site unspecified [N39.0] Patient Active Problem List   Diagnosis Date Noted  . Acute metabolic encephalopathy   . Sepsis secondary to UTI ([place]) 03/20/2019  . Closed displaced fracture of left femoral neck ([place]) 09/26/2018  . AKI (acute kidney injury) ([place]) 06/13/2016  . UTI (urinary tract infection) 06/13/2016  . Essential hypertension 06/13/2016  . Urinary tract infection without hematuria   . Complicated UTI (urinary tract infection) 05/03/2016  . Hypercalcemia 05/03/2016  . Dehydration 05/03/2016  . Acute encephalopathy 05/03/2016  . 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 Schizoaffective disorder ([place]) 05/03/2016  . Bifascicular block 05/03/2016   PCP:  [name], MD Pharmacy:  No Pharmacies Listed    Social Determinants of Health (SDOH) Interventions    Readmission Risk Interventions No flowsheet data found.

## 2019-03-25 DIAGNOSIS — R001 Bradycardia, unspecified: Secondary | ICD-10-CM

## 2019-03-25 LAB — BASIC METABOLIC PANEL
Anion gap: 7 (ref 5–15)
BUN: 13 mg/dL (ref 8–23)
CO2: 24 mmol/L (ref 22–32)
Calcium: 9.8 mg/dL (ref 8.9–10.3)
Chloride: 109 mmol/L (ref 98–111)
Creatinine, Ser: 0.87 mg/dL (ref 0.44–1.00)
GFR calc Af Amer: >60 mL/min (ref >60)
GFR calc non Af Amer: >60 mL/min (ref >60)
Glucose, Bld: 88 mg/dL (ref 70–99)
Potassium: 4.1 mmol/L (ref 3.5–5.1)
Sodium: 140 mmol/L (ref 135–145)

## 2019-03-25 LAB — CULTURE, BLOOD (ROUTINE X 2)
Culture: NO GROWTH
Special Requests: ADEQUATE

## 2019-03-25 MED ORDER — GUAIFENESIN ER 600 MG PO TB12: 1200.0000 mg | ORAL_TABLET | Freq: Two times a day (BID) | ORAL | 0 refills | Status: AC

## 2019-03-25 MED ORDER — LISINOPRIL 10 MG PO TABS: 10.0000 mg | ORAL_TABLET | Freq: Every day | ORAL | 3 refills | Status: DC

## 2019-03-25 MED ORDER — SENNOSIDES-DOCUSATE SODIUM 8.6-50 MG PO TABS: 1.0000 | ORAL_TABLET | Freq: Two times a day (BID) | ORAL | Status: DC

## 2019-03-25 MED ORDER — LISINOPRIL 10 MG PO TABS
10.0000 mg | ORAL_TABLET | Freq: Every day | ORAL | Status: DC
  Administered 2019-03-25: 10 mg via ORAL
  Filled 2019-03-25: qty 1

## 2019-03-25 MED ORDER — CEFDINIR 300 MG PO CAPS: 300.0000 mg | ORAL_CAPSULE | Freq: Two times a day (BID) | ORAL | 0 refills | Status: AC

## 2019-03-25 MED ORDER — VAGISIL ANTI-ITCH MEDICATED 1 % EX MISC: CUTANEOUS | 0 refills | Status: DC

## 2019-03-25 NOTE — Progress Notes (Signed)
Occupational Therapy Treatment Patient Details Name: Heidi Blevins MRN: [id number] DOB: [birth year] Today's Date: 03/25/2019    History of present illness Pt admitted from ALF with sepsis and acute encephalopathy 2* UTI.  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  Recommend  HHOT at discharge to ensure she progresses to mod I   Follow Up Recommendations  Home health OT    Equipment Recommendations  None recommended by OT    Recommendations for Other Services      Precautions / Restrictions Precautions Precautions: Fall       Mobility Bed Mobility Overal bed mobility: Modified Independent                Transfers Overall transfer level: Needs assistance Equipment used: Rolling walker (2 wheeled) Transfers: Sit to/from [facility] Sit to Stand: Min guard Stand pivot transfers: Min guard            Balance Overall balance assessment: Needs assistance Sitting-balance support: No upper extremity supported;Feet supported Sitting balance-Leahy Scale: Good     Standing balance support: During functional activity;No upper extremity supported Standing balance-Leahy Scale: Fair                             ADL either performed or assessed with clinical judgement   ADL Overall ADL's : Needs assistance/impaired Eating/Feeding: Sitting;Independent   Grooming: Wash/dry hands;Wash/dry face;Min guard;Standing   Upper Body Bathing: Set up;Sitting   Lower Body Bathing: Min guard;Sit to/from stand   Upper Body Dressing : Set up;Sitting   Lower Body Dressing: Min guard;Sit to/from stand   Toilet Transfer: Min guard;Ambulation;Comfort height toilet   Toileting- Clothing Manipulation and Hygiene: Min guard;Sit to/from stand Toileting - Clothing  Manipulation Details (indicate cue type and reason): decreased thoroughness when attempting to clean peri area      Functional mobility during ADLs: Min guard General ADL Comments: min guard for safety `     Vision       Perception     Praxis      Cognition Arousal/Alertness: Awake/alert Behavior During Therapy: WFL for tasks assessed/performed Overall Cognitive Status: No family/caregiver present to determine baseline cognitive functioning                                 General Comments: decreased thoroughness with toileting tasks         Exercises     Shoulder Instructions       General Comments      Pertinent Vitals/ Pain       Pain Assessment: No/denies pain  Home Living                                          Prior Functioning/Environment              Frequency  Min 2X/week        Progress Toward Goals  OT Goals(current goals can now be found in the care plan section)  Progress towards OT goals: Progressing toward goals     Plan Discharge plan remains appropriate    Co-evaluation  AM-PAC OT "6 Clicks" Daily Activity     Outcome Measure   Help from another person eating meals?: None Help from another person taking care of personal grooming?: A Little Help from another person toileting, which includes using toliet, bedpan, or urinal?: A Little Help from another person bathing (including washing, rinsing, drying)?: A Little Help from another person to put on and taking off regular upper body clothing?: None Help from another person to put on and taking off regular lower body clothing?: A Little 6 Click Score: 20    End of Session Equipment Utilized During Treatment: Rolling walker  OT Visit Diagnosis: Other abnormalities of gait and mobility (R26.89);Muscle weakness (generalized) (M62.81)   Activity Tolerance Patient tolerated treatment well   Patient Left in chair;with call  bell/phone within reach;with chair alarm set   Nurse Communication Mobility status        Time: 2563-8937 OT Time Calculation (min): 10 min  Charges: OT General Charges $OT Visit: 1 Visit OT Treatments $Self Care/Home Management : 8-22 mins  Lucille Passy, OTR/L [place] Pager [phone] Office [phone]    Lucille Passy M 03/25/2019, 12:36 PM

## 2019-03-25 NOTE — Progress Notes (Addendum)
Physical Therapy Treatment Patient Details Name: Heidi Blevins MRN: [id number] DOB: 13-Feb-1951 Today's Date: 03/25/2019    History of Present Illness Pt admitted from ALF with sepsis and acute encephalopathy 2* UTI.  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 Pt reports she ambulates without RW at baseline but feels more steady with it. She required assist for pericare after having a BM. Supervision for mobility and assist with toileting recommended. From PT standpoint, she is ready to DC to ALF.    Follow Up Recommendations  Home health PT;Supervision for mobility/OOB     Equipment Recommendations  None recommended by PT    Recommendations for Other Services       Precautions / Restrictions Precautions Precautions: Fall Restrictions Weight Bearing Restrictions: No    Mobility  Bed Mobility Overal bed mobility: Modified Independent                Transfers Overall transfer level: Needs assistance Equipment used: Rolling walker (2 wheeled) Transfers: Sit to/from Stand Sit to Stand: Min guard Stand pivot transfers: Min guard       General transfer comment: VCs hand placement  Ambulation/Gait Ambulation/Gait assistance: Min assist Gait Distance (Feet): 120 Feet Assistive device: Rolling walker (2 wheeled) Gait Pattern/deviations: Step-through pattern;Trunk flexed Gait velocity: WFL   General Gait Details: VCs for posture and positioning in RW (steps too far behind RW); min A to maneuver RW around obstacles   Stairs             Wheelchair Mobility    Modified Rankin (Stroke Patients Only)       Balance Overall balance assessment: Needs assistance Sitting-balance support: No upper extremity supported;Feet supported Sitting balance-Leahy  Scale: Good     Standing balance support: During functional activity;No upper extremity supported Standing balance-Leahy Scale: Fair                              Cognition Arousal/Alertness: Awake/alert Behavior During Therapy: WFL for tasks assessed/performed Overall Cognitive Status: No family/caregiver present to determine baseline cognitive functioning                                 General Comments: decreased thoroughness with toileting tasks       Exercises      General Comments        Pertinent Vitals/Pain Pain Assessment: 0-10 Pain Score: 6  Pain Location: L hip (h/o fx in January) with walking Pain Descriptors / Indicators: Sore Pain Intervention(s): Limited activity within patient's tolerance;Monitored during session;Premedicated before session    Home Living                      Prior Function            PT Goals (current goals can now be found in the care plan section) Acute Rehab PT Goals Patient Stated Goal: none stated Time For Goal Achievement: 04/05/19 Potential to Achieve Goals: Good Progress towards PT goals: Progressing toward goals    Frequency    Min 3X/week      PT Plan Current plan remains appropriate    Co-evaluation PT/OT/SLP Co-Evaluation/Treatment: Yes  AM-PAC PT "6 Clicks" Mobility   Outcome Measure  Help needed turning from your back to your side while in a flat bed without using bedrails?: None Help needed moving from lying on your back to sitting on the side of a flat bed without using bedrails?: None Help needed moving to and from a bed to a chair (including a wheelchair)?: A Little Help needed standing up from a chair using your arms (e.g., wheelchair or bedside chair)?: A Little Help needed to walk in hospital room?: A Little Help needed climbing 3-5 steps with a railing? : A Lot 6 Click Score: 19    End of Session Equipment Utilized During Treatment: Gait  belt Activity Tolerance: Patient tolerated treatment well Patient left: in chair;with call bell/phone within reach;with chair alarm set Nurse Communication: Mobility status PT Visit Diagnosis: Difficulty in walking, not elsewhere classified (R26.2);Muscle weakness (generalized) (M62.81)     Time: 5329-9242 PT Time Calculation (min) (ACUTE ONLY): 14 min  Charges:  $Gait Training: 8-22 mins                     Blondell Reveal Kistler PT 03/25/2019  Acute Rehabilitation Services Pager [phone] Office [phone]

## 2019-03-25 NOTE — Discharge Summary (Addendum)
Physician Discharge Summary  Heidi Blevins SNK:[id number] DOB: Jan 07, 1951 DOA: 03/19/2019  PCP: [name], MD  Admit date: 03/19/2019 Discharge date: 03/25/2019  Time spent: 50 minutes  Recommendations for Outpatient Follow-up:  1. Follow-up with [name], MD in 2 weeks.  On follow-up patient blood pressure need to be reassessed as patient's beta-blocker was discontinued due to bradycardia and patient's lisinopril increased to 10 mg daily.  Patient also need a basic metabolic profile done to follow-up on electrolytes and renal function. 2. Follow-up with outpatient psychiatry in 2 weeks.   Discharge Diagnoses:  Principal Problem:   Sepsis secondary to UTI [facility]) Active Problems:   Hypercalcemia   Acute encephalopathy   Schizoaffective disorder ([place])   Bifascicular block   Essential hypertension   Acute metabolic encephalopathy   Bradycardia   Discharge Condition: Stable and improved  Diet recommendation: Regular  Filed Weights   03/20/19 1602 03/24/19 0900  Weight: 61 kg 59.9 kg    History of present illness:  Per [name] is a 68 y.o. female with medical history significant of anxiety, arrhythmia, bifascicular block, depression, hypertension, insomnia schizoaffective disorder, scoliosis, vitamin D deficiency who is brought to the emergency department from her facility due to [place].  The patient has had worsening mental status since she was discharged from [facility] and started on Keflex for UTI.  She had been weak and lethargic.  EMS stated the patient normally goes out to smoke, is usually alert and oriented.  She was unable to answer questions at this time and provide further history.  ED Course: Initial vital signs temperature 99 F, pulse 81, respirations 30, blood pressure 166/98 mmHg and O2 sat 97% on room air.  Patient was given a 1000 mL of LR bolus, Ativan 1 mg IVP and a gram of Rocephin IVPB.  Her urinalysis showed trace leukocyte esterase,  otherwise was normal white count was 12.1, hemoglobin 11.7 g/dL and platelets 337.  PT was 15.0, INR 1.2 and APTT 38 seconds.  CMP shows a CO2 21 mmol/L.  Glucose of 118 and calcium of 10.7 mg/dL.  All other values are unremarkable.  Her lactic acid was normal.  Blood cultures x2 were drawn.  Urine culture was sent to the lab.  Imaging: CT head did not show any acute intracranial pathology.  CT abdomen/pelvis showed distended urinary bladder with mild wall thickening suspicious for cystitis.  There is heterogeneous enhancement of both kidneys which could represent pyelonephritis, however detailed evaluation is limited by streak active 5 from a spinal fusion hardware.  There is a small to moderate pericardial effusion.  Nodularity of the left lobe of liver.  Please see images and full radiology report for further details.   Hospital Course:  1 sepsis secondary to UTI, POA Patient admitted with acute encephalopathy with lethargy and generalized weakness.  Patient recently diagnosed with a UTI and discharged from [facility] on Keflex.  Urinalysis done on admission with trace leukocytes, nitrite negative, 11-20 WBCs.  Patient with recent urinalysis on 03/16/2019 with urine cultures positive for E. coli which was sensitive to the cephalosporins, resistant to ciprofloxacin, sensitive to Zosyn, Bactrim, Macrobid, imipenem, gentamicin and resistant to ampicillin.  Patient initially placed on IV Rocephin.  Repeat urine cultures done during this hospitalization were negative.  Patient improved clinically.  Patient was transitioned to [facility] will be discharged on 2 more days of Omnicef to complete a 7-day course of antibiotic treatment.  Outpatient follow-up.  2.  Acute metabolic encephalopathy  Secondary to problem #1.  Patient also noted with complaints of auditory hallucinations which improved during the hospitalization.  Patient was treated with antibiotics for UTI.  See #3.   3.  Schizoaffective  disorder Patient with a history of schizoaffective disorder on psychiatric medications.  Patient with complaints of auditory hallucinations.  Patient maintained on home regimen of Cogentin, Prolixin, Latuda, lithium, Ativan, melatonin, Zoloft, Risperdal as needed.  Due to patient's auditory hallucinations psychiatry was consulted and in discussion with psychiatry was felt this was patient's baseline.  Patient's auditory hallucinations improved during the hospitalization and with treatment of the UTI. Outpatient follow-up with psychiatry.   4.  Bifascicular block/bradycardia 2D echo with normal EF and no wall motion abnormalities.  Patient noted to have bradycardia the morning of 03/24/2019 with heart rates in the 40s.  Patient with history of bifascicular block.  Metoprolol was discontinued and will not be resumed on discharge.   5.  Hypertension Patient noted to have systolic blood pressures in the 90s on 03/21/2019.  Patient's antihypertensive medications were initially held.  As patient improved patient's lisinopril and metoprolol were resumed.  Patient noted to bradycardic with heart rates in the 40s the morning of 03/24/2019.  Patient's beta-blocker was discontinued as patient with history of bifascicular block.  Patient's lisinopril was increased to 10 mg daily for better blood pressure control.  Outpatient follow-up with PCP.    6.  Hypercalcemia Likely secondary to dehydration.  Resolved with hydration.   7.  Constipation Patient placed on a bowel regimen of MiraLAX 17 g daily as well as Senokot-S twice daily with good results.  Outpatient follow-up.   Procedures:  CT head 03/19/2019  CT abdomen and pelvis 03/19/2019  Chest x-ray 03/19/2019  2D echo 03/20/2019  Consultations:  Psychiatry: Dr. Mariea Clonts 03/21/2019   Discharge Exam: Vitals:   03/24/19 2104 03/25/19 0651  BP: (!) 145/78 (!) 150/78  Pulse: (!) 52 (!) 54  Resp: 16 16  Temp: 98.2 F (36.8 C) 98.9 F (37.2 C)   SpO2: 100% 98%    General: NAD Cardiovascular: RRR Respiratory: CTAB  Discharge Instructions   Discharge Instructions    Diet general   Complete by: As directed    Increase activity slowly   Complete by: As directed      Allergies as of 03/25/2019      Reactions   Sulfonamide Derivatives Hives          Medication List    STOP taking these medications   cephALEXin 250 MG capsule Commonly known as: KEFLEX   HYDROcodone-acetaminophen 7.5-325 MG tablet Commonly known as: Norco   methocarbamol 500 MG tablet Commonly known as: ROBAXIN   metoprolol tartrate 25 MG tablet Commonly known as: LOPRESSOR     TAKE these medications   acetaminophen 500 MG tablet Commonly known as: TYLENOL Take 500 mg by mouth See admin instructions. Take 500 mg by mouth at 9 AM every morning and 650 mg every 4 hours as needed for pain/fever/headaches   aspirin 81 MG EC tablet Take 1 tablet (81 mg total) by mouth daily.   benztropine 1 MG tablet Commonly known as: COGENTIN Take 1 mg by mouth 2 (two) times daily.   cefdinir 300 MG capsule Commonly known as: OMNICEF Take 1 capsule (300 mg total) by mouth every 12 (twelve) hours for 2 days.   Cranberry 425 MG Caps Take 425 mg by mouth 2 (two) times a day.   Ferocon capsule Generic drug: ferrous [id number] C-folic acid Take  1 capsule by mouth at bedtime.   fluPHENAZine 10 MG tablet Commonly known as: PROLIXIN Take 20 mg by mouth daily.   GaviLAX 17 GM/SCOOP powder Generic drug: polyethylene glycol powder Take 17 g by mouth daily. MIX INTO 8 OUNCES OF FLUID, MIX, AND DRINK   guaiFENesin 600 MG 12 hr tablet Commonly known as: MUCINEX Take 2 tablets (1,200 mg total) by mouth 2 (two) times daily for 3 days.   Latuda 120 MG Tabs Generic drug: Lurasidone HCl Take 120 mg by mouth daily.   lisinopril 10 MG tablet Commonly known as: ZESTRIL Take 1 tablet (10 mg total) by mouth daily. Hold if Systolic b/p <737 What  changed:   medication strength  how much to take   lithium carbonate 150 MG capsule Take 150-300 mg by mouth See admin instructions. Take 300 mg(2 capsules) by mouth in the morning and 150 mg (1 capsule) at bedtime   loratadine 10 MG tablet Commonly known as: CLARITIN Take 10 mg by mouth daily.   LORazepam 0.5 MG tablet Commonly known as: ATIVAN Take 1 tablet (0.5 mg total) by mouth 4 (four) times daily.   Melatonin 5 MG Tabs Take 5 mg by mouth at bedtime.   NUTRITIONAL SHAKE PLUS PO Take 1 Container by mouth 3 (three) times daily.   Ensure Take 237 mLs by mouth 2 (two) times daily. Strawberry   risperiDONE microspheres 50 MG injection Commonly known as: RISPERDAL CONSTA Inject 50 mg into the muscle every 14 (fourteen) days.   senna-docusate 8.6-50 MG tablet Commonly known as: Senokot-S Take 1 tablet by mouth 2 (two) times daily.   sertraline 100 MG tablet Commonly known as: ZOLOFT Take 100 mg by mouth daily.   Systane Balance 0.6 % Soln Generic drug: Propylene Glycol Place 1 drop into both eyes 3 (three) times daily.   Vagisil Anti-Itch Medicated 1 % Misc Generic drug: Pramoxine HCl Apply vaginally once a day Apply 1 application topically daily as needed (for vaginal itching). Apply vaginally once a day What changed:   medication strength  how much to take  how to take this  when to take this  reasons to take this  additional instructions      Allergies  Allergen Reactions  . Sulfonamide Derivatives Hives        Follow-up Information    [name], MD. Schedule an appointment as soon as possible for a visit in 2 week(s).   Specialty: Family Medicine Contact information: [place] [place] [address] [phone]        psychiatry. Schedule an appointment as soon as possible for a visit in 2 week(s).            The results of significant diagnostics from this hospitalization (including imaging, microbiology,  ancillary and laboratory) are listed below for reference.    Significant Diagnostic Studies: Ct Head Wo Contrast  Result Date: 03/19/2019 CLINICAL DATA:  Altered level of consciousness (LOC), unexplained EXAM: CT HEAD WITHOUT CONTRAST TECHNIQUE: Contiguous axial images were obtained from the base of the skull through the vertex without intravenous contrast. COMPARISON:  Head CT 06/12/2016. FINDINGS: Brain: No evidence of acute infarction, hemorrhage, hydrocephalus, extra-axial collection or mass lesion/mass effect. Similar atrophy and chronic small vessel ischemia. Vascular: Atherosclerosis of skullbase vasculature without hyperdense vessel or abnormal calcification. Skull: No fracture or focal lesion. Sinuses/Orbits: Paranasal sinuses and mastoid air cells are clear. The visualized orbits are unremarkable. Other: None. IMPRESSION: No acute intracranial abnormality. Stable atrophy  and chronic small vessel ischemia. Electronically Signed   By: Keith Rake M.D.   On: 03/19/2019 23:59   Ct Abdomen Pelvis W Contrast  Result Date: 03/19/2019 CLINICAL DATA:  Acute abdominal pain. EXAM: CT ABDOMEN AND PELVIS WITH CONTRAST TECHNIQUE: Multidetector CT imaging of the abdomen and pelvis was performed using the standard protocol following bolus administration of intravenous contrast. CONTRAST:  171mL OMNIPAQUE IOHEXOL 300 MG/ML  SOLN COMPARISON:  None. FINDINGS: Lower chest: Small to moderate pericardial effusion measuring up to 2 cm in depth adjacent to the right ventricle. Coronary artery calcifications. Borderline cardiomegaly. No acute airspace disease. Hepatobiliary: No focal liver abnormality is seen. Possible slight capsular nodularity of the left lobe. No gallstones, gallbladder wall thickening, or biliary dilatation. Pancreas: No ductal dilatation or inflammation. Spleen: Normal in size without focal abnormality. Adrenals/Urinary Tract: Normal adrenal glands. No hydronephrosis. No perinephric edema. 16 mm  low-density lesion in the upper right kidney is likely a cyst, however streak artifact from adjacent spine hardware obscures accurate characterization. Slight heterogeneous enhancement of both kidneys. Urinary bladder is distended. Questionable bladder trabeculation. Mild wall thickening about the dome. Stomach/Bowel: Bowel evaluation is limited in the absence of enteric contrast and paucity of intra-abdominal fat. Stomach is nondistended. Duodenal diverticulum without inflammation. No small bowel wall thickening, inflammatory change, or obstruction. Moderate volume of stool throughout the colon. Normal appendix, image 60 series 5. No colonic wall thickening or inflammatory change. Vascular/Lymphatic: Aortic atherosclerosis and tortuosity. No aneurysm. No abdominopelvic adenopathy. Reproductive: Uterus and bilateral adnexa are unremarkable. Other: No free air, free fluid, or intra-abdominal fluid collection. Musculoskeletal: Scoliosis and spinal fusion hardware. Multilevel degenerative change in the spine. Left hip arthroplasty. Prominent heterotopic ossification extends to the anterior iliac crest. IMPRESSION: 1. Distended urinary bladder with mild wall thickening suspicious for cystitis. Heterogeneous enhancement of both kidneys may represent pyelonephritis, however detailed evaluation is limited by streak artifact from spinal fusion hardware. 2. Small to moderate pericardial effusion. 3. Questionable nodular contours about the left lobe of the liver, recommend correlation for cirrhosis risk factors. Aortic Atherosclerosis (ICD10-I70.0). Electronically Signed   By: Keith Rake M.D.   On: 03/19/2019 23:56   Dg Chest Port 1 View  Result Date: 03/24/2019 CLINICAL DATA:  68 year old female with shortness of breath. EXAM: PORTABLE CHEST 1 VIEW COMPARISON:  Portable chest 03/19/2019 and earlier. FINDINGS: Portable AP semi upright view at 0855 hours. Pronounced dextroconvex thoracic scoliosis with posterior  spinal rod. Stable cardiac size and mediastinal contours. Stable lung volumes and allowing for portable technique the lungs are clear. No pneumothorax. Negative visible bowel gas.  Stable visualized osseous structures. IMPRESSION: No acute cardiopulmonary abnormality. Electronically Signed   By: Genevie Ann M.D.   On: 03/24/2019 09:20   Dg Chest Port 1 View  Result Date: 03/19/2019 CLINICAL DATA:  Sepsis.  Altered mental status. EXAM: PORTABLE CHEST 1 VIEW COMPARISON:  Chest x-ray dated March 16, 2019. FINDINGS: The heart size and mediastinal contours are within normal limits. Normal pulmonary vascularity. No focal consolidation, pleural effusion, or pneumothorax. No acute osseous abnormality. Unchanged thoracolumbar scoliosis status post Harrington rod fixation. IMPRESSION: No active disease. Electronically Signed   By: Titus Dubin M.D.   On: 03/19/2019 21:39   Dg Chest Portable 1 View  Result Date: 03/16/2019 CLINICAL DATA:  Chest pressure. History of hypertension. EXAM: PORTABLE CHEST 1 VIEW COMPARISON:  X-rays dated 09/26/2018 and 06/17/2016. FINDINGS: Heart size and mediastinal contours are stable. Lungs are clear. No pleural effusion or pneumothorax seen. No acute  appearing osseous abnormality. Fixation rod within the thoracolumbar spine appears stable in position. IMPRESSION: No active disease. No evidence of pneumonia or pulmonary edema. Electronically Signed   By: Franki Cabot M.D.   On: 03/16/2019 07:39    Microbiology: Recent Results (from the past 240 hour(s))  SARS Coronavirus 2 (CEPHEID - Performed in [place] hospital lab), Hosp Order     Status: None   Collection Time: 03/16/19  6:45 AM   Specimen: Nasopharyngeal Swab  Result Value Ref Range Status   SARS Coronavirus 2 NEGATIVE NEGATIVE Final    Comment: (NOTE) If result is NEGATIVE SARS-CoV-2 target nucleic acids are NOT DETECTED. The SARS-CoV-2 RNA is generally detectable in upper and lower  respiratory specimens during the  acute phase of infection. The lowest  concentration of SARS-CoV-2 viral copies this assay can detect is 250  copies / mL. A negative result does not preclude SARS-CoV-2 infection  and should not be used as the sole basis for treatment or other  patient management decisions.  A negative result may occur with  improper specimen collection / handling, submission of specimen other  than nasopharyngeal swab, presence of viral mutation(s) within the  areas targeted by this assay, and inadequate number of viral copies  (<250 copies / mL). A negative result must be combined with clinical  observations, patient history, and epidemiological information. If result is POSITIVE SARS-CoV-2 target nucleic acids are DETECTED. The SARS-CoV-2 RNA is generally detectable in upper and lower  respiratory specimens dur ing the acute phase of infection.  Positive  results are indicative of active infection with SARS-CoV-2.  Clinical  correlation with patient history and other diagnostic information is  necessary to determine patient infection status.  Positive results do  not rule out bacterial infection or co-infection with other viruses. If result is PRESUMPTIVE POSTIVE SARS-CoV-2 nucleic acids MAY BE PRESENT.   A presumptive positive result was obtained on the submitted specimen  and confirmed on repeat testing.  While 2019 novel coronavirus  (SARS-CoV-2) nucleic acids may be present in the submitted sample  additional confirmatory testing may be necessary for epidemiological  and / or clinical management purposes  to differentiate between  SARS-CoV-2 and other Sarbecovirus currently known to infect humans.  If clinically indicated additional testing with an alternate test  methodology [phone]) is advised. The SARS-CoV-2 RNA is generally  detectable in upper and lower respiratory sp ecimens during the acute  phase of infection. The expected result is Negative. Fact Sheet for Patients:   StrictlyIdeas.no Fact Sheet for Healthcare Providers: BankingDealers.co.za This test is not yet approved or cleared by the [place] FDA and has been authorized for detection and/or diagnosis of SARS-CoV-2 by FDA under an Emergency Use Authorization (EUA).  This EUA will remain in effect (meaning this test can be used) for the duration of the COVID-19 declaration under Section 564(b)(1) of the Act, 21 U.S.C. section 360bbb-3(b)(1), unless the authorization is terminated or revoked sooner. Performed at [facility], [place] [address], [place],  [address]   Blood Culture (routine x 2)     Status: Abnormal   Collection Time: 03/16/19  7:28 AM   Specimen: BLOOD  Result Value Ref Range Status   Specimen Description BLOOD LEFT ANTECUBITAL  Final   Special Requests   Final    BOTTLES DRAWN AEROBIC AND ANAEROBIC Blood Culture adequate volume   Culture  Setup Time   Final    GRAM POSITIVE COCCI IN CLUSTERS ANAEROBIC BOTTLE ONLY  CRITICAL RESULT CALLED TO, READ BACK BY AND VERIFIED WITH: R. Tome, AT [place] 03/17/19 BY D.VANHOOK    Culture (A)  Final    STAPHYLOCOCCUS SPECIES (COAGULASE NEGATIVE) THE SIGNIFICANCE OF ISOLATING THIS ORGANISM FROM A SINGLE SET OF BLOOD CULTURES WHEN MULTIPLE SETS ARE DRAWN IS UNCERTAIN. PLEASE NOTIFY THE MICROBIOLOGY DEPARTMENT WITHIN ONE WEEK IF SPECIATION AND SENSITIVITIES ARE REQUIRED. Performed at [facility], [place] [address], [place], [place] [address]    Report Status 03/19/2019 FINAL  Final  Blood Culture ID Panel (Reflexed)     Status: Abnormal   Collection Time: 03/16/19  7:28 AM  Result Value Ref Range Status   Enterococcus species NOT DETECTED NOT DETECTED Final   Listeria monocytogenes NOT DETECTED NOT DETECTED Final   Staphylococcus species DETECTED (A) NOT DETECTED Final    Comment: Methicillin (oxacillin) susceptible coagulase negative staphylococcus. Possible blood  culture contaminant (unless isolated from more than one blood culture draw or clinical case suggests pathogenicity). No antibiotic treatment is indicated for blood  culture contaminants. CRITICAL RESULT CALLED TO, READ BACK BY AND VERIFIED WITH: R. WHITE RN , AT 9735 03/17/19 BY D. VANHOOK    Staphylococcus aureus (BCID) NOT DETECTED NOT DETECTED Final   Methicillin resistance NOT DETECTED NOT DETECTED Final   Streptococcus species NOT DETECTED NOT DETECTED Final   Streptococcus agalactiae NOT DETECTED NOT DETECTED Final   Streptococcus pneumoniae NOT DETECTED NOT DETECTED Final   Streptococcus pyogenes NOT DETECTED NOT DETECTED Final   Acinetobacter baumannii NOT DETECTED NOT DETECTED Final   Enterobacteriaceae species NOT DETECTED NOT DETECTED Final   Enterobacter cloacae complex NOT DETECTED NOT DETECTED Final   Escherichia coli NOT DETECTED NOT DETECTED Final   Klebsiella oxytoca NOT DETECTED NOT DETECTED Final   Klebsiella pneumoniae NOT DETECTED NOT DETECTED Final   Proteus species NOT DETECTED NOT DETECTED Final   Serratia marcescens NOT DETECTED NOT DETECTED Final   Haemophilus influenzae NOT DETECTED NOT DETECTED Final   Neisseria meningitidis NOT DETECTED NOT DETECTED Final   Pseudomonas aeruginosa NOT DETECTED NOT DETECTED Final   Candida albicans NOT DETECTED NOT DETECTED Final   Candida glabrata NOT DETECTED NOT DETECTED Final   Candida krusei NOT DETECTED NOT DETECTED Final   Candida parapsilosis NOT DETECTED NOT DETECTED Final   Candida tropicalis NOT DETECTED NOT DETECTED Final    Comment: Performed at [facility], [address], [place], [place] [address]  Blood Culture (routine x 2)     Status: None   Collection Time: 03/16/19  7:39 AM   Specimen: BLOOD  Result Value Ref Range Status   Specimen Description BLOOD RIGHT ANTECUBITAL  Final   Special Requests   Final    BOTTLES DRAWN AEROBIC AND ANAEROBIC Blood Culture adequate volume   Culture   Final    NO  GROWTH 5 DAYS Performed at [facility], [place] [address], [place], [place] [address]    Report Status 03/21/2019 FINAL  Final  Urine culture     Status: Abnormal   Collection Time: 03/16/19 10:31 AM   Specimen: Urine, Random  Result Value Ref Range Status   Specimen Description URINE, RANDOM  Final   Special Requests   Final    NONE Performed at [facility], [place] [address], [place], [place] [address]    Culture >=100,000 COLONIES/mL ESCHERICHIA COLI (A)  Final   Report Status 03/18/2019 FINAL  Final   Organism ID, Bacteria ESCHERICHIA COLI (A)  Final      Susceptibility   Escherichia coli - MIC*    AMPICILLIN >=32 RESISTANT Resistant     CEFAZOLIN <=4 SENSITIVE Sensitive     CEFTRIAXONE <=1 SENSITIVE Sensitive     CIPROFLOXACIN >=4 RESISTANT Resistant     GENTAMICIN <=1 SENSITIVE Sensitive     IMIPENEM <=0.25 SENSITIVE Sensitive     NITROFURANTOIN <=16 SENSITIVE Sensitive     TRIMETH/SULFA <=20 SENSITIVE Sensitive     AMPICILLIN/SULBACTAM 16 INTERMEDIATE Intermediate     PIP/TAZO <=4 SENSITIVE Sensitive     Extended ESBL NEGATIVE Sensitive     * >=100,000 COLONIES/mL ESCHERICHIA COLI  Blood Culture (routine x 2)     Status: None   Collection Time: 03/19/19  9:42 PM   Specimen: BLOOD  Result Value Ref Range Status   Specimen Description   Final    BLOOD RIGHT ANTECUBITAL Performed at [facility] [address], [place], [place] [address]    Special Requests   Final    BOTTLES DRAWN AEROBIC AND ANAEROBIC Blood Culture adequate volume Performed at [facility] [address] [place], [place] [address]    Culture   Final    NO GROWTH 5 DAYS Performed at [facility], [place] [address] [place], [place] [address]    Report Status 03/24/2019 FINAL  Final  Blood Culture (routine x 2)     Status: None   Collection Time: 03/19/19  9:45 PM   Specimen: BLOOD  Result Value Ref Range Status   Specimen Description   Final     BLOOD LEFT ANTECUBITAL Performed at [facility] [address], [place], [place] [address]    Special Requests   Final    BOTTLES DRAWN AEROBIC AND ANAEROBIC Blood Culture adequate volume Performed at [facility] [address], [place], [place] [address]    Culture   Final    NO GROWTH 5 DAYS Performed at [facility], [place] [address], [place], [place] [address]    Report Status 03/25/2019 FINAL  Final  Urine culture     Status: None   Collection Time: 03/19/19 10:19 PM   Specimen: In/Out Cath Urine  Result Value Ref Range Status   Specimen Description   Final    IN/OUT CATH URINE Performed at [facility] [address], [place], [place] [address]    Special Requests   Final    NONE Performed at [facility], [place] [address] [place], [place] [address]    Culture   Final    NO GROWTH Performed at [facility], [place] [address], [place], [place] [address]    Report Status 03/20/2019 FINAL  Final  SARS Coronavirus 2 (CEPHEID - Performed in [facility] hospital lab), Hosp Order     Status: None   Collection Time: 03/19/19 11:29 PM   Specimen: Nasopharyngeal Swab  Result Value Ref Range Status   SARS Coronavirus 2 NEGATIVE NEGATIVE Final    Comment: (NOTE) If result is NEGATIVE SARS-CoV-2 target nucleic acids are NOT DETECTED. The SARS-CoV-2 RNA is generally detectable in upper and lower  respiratory specimens during the acute phase of infection. The lowest  concentration of SARS-CoV-2 viral copies this assay can detect is 250  copies / mL. A negative result does not preclude SARS-CoV-2 infection  and should not be used as the sole basis for treatment or other  patient management decisions.  A  negative result may occur with  improper specimen collection / handling, submission of specimen other  than nasopharyngeal swab, presence of viral mutation(s) within the  areas targeted by  this assay, and inadequate number of viral copies  (<250 copies / mL). A negative result must be combined with clinical  observations, patient history, and epidemiological information. If result is POSITIVE SARS-CoV-2 target nucleic acids are DETECTED. The SARS-CoV-2 RNA is generally detectable in upper and lower  respiratory specimens dur ing the acute phase of infection.  Positive  results are indicative of active infection with SARS-CoV-2.  Clinical  correlation with patient history and other diagnostic information is  necessary to determine patient infection status.  Positive results do  not rule out bacterial infection or co-infection with other viruses. If result is PRESUMPTIVE POSTIVE SARS-CoV-2 nucleic acids MAY BE PRESENT.   A presumptive positive result was obtained on the submitted specimen  and confirmed on repeat testing.  While 2019 novel coronavirus  (SARS-CoV-2) nucleic acids may be present in the submitted sample  additional confirmatory testing may be necessary for epidemiological  and / or clinical management purposes  to differentiate between  SARS-CoV-2 and other Sarbecovirus currently known to infect humans.  If clinically indicated additional testing with an alternate test  methodology [phone]) is advised. The SARS-CoV-2 RNA is generally  detectable in upper and lower respiratory sp ecimens during the acute  phase of infection. The expected result is Negative. Fact Sheet for Patients:  StrictlyIdeas.no Fact Sheet for Healthcare Providers: BankingDealers.co.za This test is not yet approved or cleared by the [place] FDA and has been authorized for detection and/or diagnosis of SARS-CoV-2 by FDA under an Emergency Use Authorization (EUA).  This EUA will remain in effect (meaning this test can be used) for the duration of the COVID-19 declaration under Section 564(b)(1) of the Act, 21 U.S.C. section  360bbb-3(b)(1), unless the authorization is terminated or revoked sooner. Performed at [facility], [place] [address], [place], [place] [address]      Labs: Basic Metabolic Panel: Recent Labs  Lab 03/19/19 2142 03/20/19 1645 03/21/19 0558 03/22/19 0628 03/23/19 0558 03/25/19 0527  NA 140 138 138 139 140 140  K 4.1 3.7 4.4 4.8 4.2 4.1  CL 110 109 110 114* 113* 109  CO2 21* 18* 19* 20* 22 24  GLUCOSE 118* 93 91 82 78 88  BUN 13 11 8 11 11 13   CREATININE 0.69 0.67 0.64 0.82 0.71 0.87  CALCIUM 10.7* 10.3 9.6 9.3 9.7 9.8  MG 2.3  --   --   --   --   --   PHOS 3.0  --   --   --   --   --    Liver Function Tests: Recent Labs  Lab 03/19/19 2142  AST 14*  ALT 16  ALKPHOS 111  BILITOT 0.3  PROT 7.8  ALBUMIN 3.6   No results for input(s): LIPASE, AMYLASE in the last 168 hours. No results for input(s): AMMONIA in the last 168 hours. CBC: Recent Labs  Lab 03/19/19 2142 03/20/19 0500 03/21/19 0558 03/22/19 0628 03/23/19 0558  WBC 12.1* 12.8* 11.0* 8.0 7.4  NEUTROABS 9.1* 9.6* 7.8* 4.9  --   HGB 11.7* 11.1* 10.5* 9.7* 8.8*  HCT 37.6 36.0 33.6* 32.3* 28.8*  MCV 99.2 101.1* 99.7 101.6* 101.1*  PLT 337 279 300 316 329   Cardiac Enzymes: No results for input(s): CKTOTAL, CKMB, CKMBINDEX, TROPONINI in the last 168 hours. BNP: BNP (last  3 results) No results for input(s): BNP in the last 8760 hours.  ProBNP (last 3 results) No results for input(s): PROBNP in the last 8760 hours.  CBG: Recent Labs  Lab 03/19/19 2122  GLUCAP 110*       Signed:  Irine Seal MD.  [facility] 03/25/2019, 4:08 PM

## 2019-03-25 NOTE — TOC Transition Note (Addendum)
Transition of Care [facility]) - CM/SW Discharge Note   Patient Details  Name: Heidi Blevins MRN: [id number] Date of Birth: 06-24-1951  [facility] CM/SW Contact:  Servando Snare, LCSW Phone Number: 03/25/2019, 3:47 PM   Clinical Narrative:   Patient returning to [facility]. LCSW faxed [place] orders and dc docs to facility.   RN report number [phone] please get script off printer.   Final next level of care: Assisted Living Barriers to Discharge: No Barriers Identified   Patient Goals and CMS Choice     Choice offered to / list presented to : NA  Discharge Placement              Patient chooses bed at: (Returning to [facility]) Patient to be transferred to facility by: EMS      Discharge Plan and Services In-house Referral: NA   Post Acute Care Choice: Nursing Home          DME Arranged: N/A DME Agency: NA       HH Arranged: PT, OT HH Agency: (Patient from [facility])        Social Determinants of Health (SDOH) Interventions     Readmission Risk Interventions No flowsheet data found.

## 2019-04-17 ENCOUNTER — Emergency Department (HOSPITAL_COMMUNITY): Payer: Medicare Other

## 2019-04-17 ENCOUNTER — Encounter (HOSPITAL_COMMUNITY): Payer: Self-pay | Admitting: Emergency Medicine

## 2019-04-17 ENCOUNTER — Emergency Department (HOSPITAL_COMMUNITY)
Admission: EM | Admit: 2019-04-17 | Discharge: 2019-04-18 | Disposition: A | Discharge status: Skilled Nursing Facility | Payer: Medicare Other | Attending: Emergency Medicine | Admitting: Emergency Medicine

## 2019-04-17 DIAGNOSIS — Z7982 Long term (current) use of aspirin: Secondary | ICD-10-CM | POA: Insufficient documentation

## 2019-04-17 DIAGNOSIS — N3001 Acute cystitis with hematuria: Secondary | ICD-10-CM | POA: Diagnosis not present

## 2019-04-17 DIAGNOSIS — I10 Essential (primary) hypertension: Secondary | ICD-10-CM | POA: Diagnosis not present

## 2019-04-17 DIAGNOSIS — F17210 Nicotine dependence, cigarettes, uncomplicated: Secondary | ICD-10-CM | POA: Diagnosis not present

## 2019-04-17 DIAGNOSIS — Z79899 Other long term (current) drug therapy: Secondary | ICD-10-CM | POA: Insufficient documentation

## 2019-04-17 DIAGNOSIS — Z20822 Contact with and (suspected) exposure to covid-19: Secondary | ICD-10-CM

## 2019-04-17 DIAGNOSIS — Z20828 Contact with and (suspected) exposure to other viral communicable diseases: Secondary | ICD-10-CM | POA: Diagnosis not present

## 2019-04-17 DIAGNOSIS — R509 Fever, unspecified: Secondary | ICD-10-CM | POA: Diagnosis present

## 2019-04-17 LAB — CBC WITH DIFFERENTIAL/PLATELET
Abs Immature Granulocytes: 0.01 10*3/uL (ref 0.00–0.07)
Basophils Absolute: 0.1 10*3/uL (ref 0.0–0.1)
Basophils Relative: 1 %
Eosinophils Absolute: 0.2 10*3/uL (ref 0.0–0.5)
Eosinophils Relative: 2 %
HCT: 38.4 % (ref 36.0–46.0)
Hemoglobin: 11.8 g/dL — ABNORMAL LOW (ref 12.0–15.0)
Immature Granulocytes: 0 %
Lymphocytes Relative: 24 %
Lymphs Abs: 1.9 10*3/uL (ref 0.7–4.0)
MCH: 31.3 pg (ref 26.0–34.0)
MCHC: 30.7 g/dL (ref 30.0–36.0)
MCV: 101.9 fL — ABNORMAL HIGH (ref 80.0–100.0)
Monocytes Absolute: 0.6 10*3/uL (ref 0.1–1.0)
Monocytes Relative: 8 %
Neutro Abs: 5.2 10*3/uL (ref 1.7–7.7)
Neutrophils Relative %: 65 %
Platelets: 272 10*3/uL (ref 150–400)
RBC: 3.77 MIL/uL — ABNORMAL LOW (ref 3.87–5.11)
RDW: 13.2 % (ref 11.5–15.5)
WBC: 7.9 10*3/uL (ref 4.0–10.5)
nRBC: 0 % (ref 0.0–0.2)

## 2019-04-17 LAB — SARS CORONAVIRUS 2 BY RT PCR (HOSPITAL ORDER, PERFORMED IN ~~LOC~~ HOSPITAL LAB): SARS Coronavirus 2: NEGATIVE

## 2019-04-17 LAB — URINALYSIS, ROUTINE W REFLEX MICROSCOPIC
Bacteria, UA: NONE SEEN
Bilirubin Urine: NEGATIVE
Glucose, UA: NEGATIVE mg/dL
Hgb urine dipstick: NEGATIVE
Ketones, ur: NEGATIVE mg/dL
Nitrite: NEGATIVE
Protein, ur: NEGATIVE mg/dL
RBC / HPF: >50 RBC/hpf — ABNORMAL HIGH (ref 0–5)
Specific Gravity, Urine: 1.01 (ref 1.005–1.030)
pH: 7 (ref 5.0–8.0)

## 2019-04-17 LAB — BASIC METABOLIC PANEL
Anion gap: 10 (ref 5–15)
BUN: 9 mg/dL (ref 8–23)
CO2: 20 mmol/L — ABNORMAL LOW (ref 22–32)
Calcium: 11.3 mg/dL — ABNORMAL HIGH (ref 8.9–10.3)
Chloride: 111 mmol/L (ref 98–111)
Creatinine, Ser: 0.87 mg/dL (ref 0.44–1.00)
GFR calc Af Amer: >60 mL/min (ref >60)
GFR calc non Af Amer: >60 mL/min (ref >60)
Glucose, Bld: 115 mg/dL — ABNORMAL HIGH (ref 70–99)
Potassium: 3.8 mmol/L (ref 3.5–5.1)
Sodium: 141 mmol/L (ref 135–145)

## 2019-04-17 LAB — LACTIC ACID, PLASMA: Lactic Acid, Venous: 0.9 mmol/L (ref 0.5–1.9)

## 2019-04-17 LAB — GROUP A STREP BY PCR: Group A Strep by PCR: NOT DETECTED

## 2019-04-17 LAB — TROPONIN I (HIGH SENSITIVITY)
Troponin I (High Sensitivity): 6 ng/L (ref <18)
Troponin I (High Sensitivity): 7 ng/L (ref <18)

## 2019-04-17 LAB — BRAIN NATRIURETIC PEPTIDE: B Natriuretic Peptide: 27.6 pg/mL (ref 0.0–100.0)

## 2019-04-17 MED ORDER — CEPHALEXIN 500 MG PO CAPS: 500.0000 mg | ORAL_CAPSULE | Freq: Four times a day (QID) | ORAL | 0 refills | Status: DC

## 2019-04-17 MED ORDER — SODIUM CHLORIDE 0.9 % IV SOLN
1.0000 g | Freq: Once | INTRAVENOUS | Status: AC
  Administered 2019-04-17: 1 g via INTRAVENOUS
  Filled 2019-04-17: qty 10

## 2019-04-17 MED ORDER — ACETAMINOPHEN 500 MG PO TABS
1000.0000 mg | ORAL_TABLET | Freq: Once | ORAL | Status: AC
  Administered 2019-04-17: 20:00:00 1000 mg via ORAL
  Filled 2019-04-17: qty 2

## 2019-04-17 MED ORDER — SODIUM CHLORIDE 0.9 % IV BOLUS
1000.0000 mL | Freq: Once | INTRAVENOUS | Status: AC
  Administered 2019-04-17: 20:00:00 1000 mL via INTRAVENOUS

## 2019-04-17 MED ORDER — LORAZEPAM 2 MG/ML IJ SOLN
1.0000 mg | Freq: Once | INTRAMUSCULAR | Status: AC
  Administered 2019-04-17: 1 mg via INTRAVENOUS
  Filled 2019-04-17: qty 1

## 2019-04-17 NOTE — ED Notes (Signed)
Radiology reports patient would not sit still for her xray. Patient returned to room. MD made aware.

## 2019-04-17 NOTE — ED Notes (Signed)
[facility] called for transportation  

## 2019-04-17 NOTE — ED Notes (Signed)
Patient transported to x-ray. ?

## 2019-04-17 NOTE — ED Provider Notes (Signed)
[facility] DEPT Provider Note   CSN: [id number] Arrival date & time: 04/17/19  1657     History   Chief Complaint Chief Complaint  Patient presents with  . Sore Throat    HPI Heidi Blevins is a 68 y.o. female.     Pt presents to the ED today with sore throat, fever, cough.  Pt is here from [facility].  She has a hx of schizoaffective d/o and is not giving me much hx.  She does have a fever here.  She is unable to tell me how long any of her sx have been going on.  No known covid exposures.     Past Medical History:  Diagnosis Date  . Anxiety   . Arrhythmia   . Bifascicular block   . Depression   . Hypertension   . Insomnia   . Schizoaffective disorder   . Scoliosis   . Vitamin D deficiency     Patient Active Problem List   Diagnosis Date Noted  . Bradycardia   . Acute metabolic encephalopathy   . Sepsis secondary to UTI ([place]) 03/20/2019  . Closed displaced fracture of left femoral neck ([place]) 09/26/2018  . AKI (acute kidney injury) ([place]) 06/13/2016  . UTI (urinary tract infection) 06/13/2016  . Essential hypertension 06/13/2016  . Urinary tract infection without hematuria   . Complicated UTI (urinary tract infection) 05/03/2016  . Hypercalcemia 05/03/2016  . Dehydration 05/03/2016  . Acute encephalopathy 05/03/2016  . 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 Schizoaffective disorder ([place] 05/03/2016  . Bifascicular block 05/03/2016    Past Surgical History:  Procedure Laterality Date  . BACK SURGERY     "as a child after she fell out of a tree"  . TONSILLECTOMY    . 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      Home Medications    Prior to Admission medications   Medication Sig Start Date End Date Taking? Authorizing  Provider  acetaminophen (TYLENOL) 325 MG tablet Take 650 mg by mouth every 4 (four) hours as needed for mild pain, moderate pain, fever or headache.   Yes [provider]  aspirin EC 81 MG EC tablet Take 1 tablet (81 mg total) by mouth daily. 05/06/16  Yes Patrecia Pour, Christean Grief, MD  benztropine (COGENTIN) 1 MG tablet Take 1 mg by mouth 2 (two) times daily.   Yes [provider]  Cranberry 425 MG CAPS Take 425 mg by mouth 2 (two) times a day.   Yes [provider]  ferrous [id number] C-folic acid (FEROCON) capsule Take 1 capsule by mouth at bedtime.   Yes [provider]  fluPHENAZine (PROLIXIN) 10 MG tablet Take 20 mg by mouth daily.    Yes [provider]  LATUDA 120 MG TABS Take 120 mg by mouth daily. 09/11/18  Yes [provider]  lisinopril (ZESTRIL) 10 MG tablet Take 1 tablet (10 mg total) by mouth daily. Hold if Systolic b/p <767 10/06/45  Yes Eugenie Filler, MD  lithium carbonate 150 MG capsule Take 150-300 mg by mouth See admin instructions. Take 300 mg(2 capsules) by mouth in the morning and 150 mg (1 capsule) at bedtime 09/11/18  Yes [provider]  loratadine (CLARITIN) 10 MG tablet Take 10 mg by  mouth daily.   Yes [provider]  LORazepam (ATIVAN) 0.5 MG tablet Take 1 tablet (0.5 mg total) by mouth 4 (four) times daily. 10/03/18  Yes Oretha Milch D, MD  Melatonin 5 MG TABS Take 5 mg by mouth at bedtime.   Yes [provider]  polyethylene glycol powder (GAVILAX) 17 GM/SCOOP powder Take 17 g by mouth daily. MIX INTO 8 OUNCES OF FLUID, MIX, AND DRINK   Yes [provider]  Pramoxine HCl (VAGISIL ANTI-ITCH MEDICATED) 1 % MISC Apply vaginally once a day Apply 1 application topically daily as needed (for vaginal itching). Apply vaginally once a day 03/25/19  Yes Eugenie Filler, MD  Propylene Glycol (SYSTANE BALANCE) 0.6 % SOLN Place 1 drop into both eyes 3 (three) times daily.    Yes [provider]  risperiDONE microspheres (RISPERDAL CONSTA) 50 MG injection Inject 50 mg into the muscle every 14 (fourteen) days.   Yes [provider]  sertraline (ZOLOFT) 100 MG tablet Take 100 mg by mouth daily.    Yes [provider]  acetaminophen (TYLENOL) 500 MG tablet Take 500 mg by mouth See admin instructions. Take 500 mg by mouth at 9 AM every morning and 650 mg every 4 hours as needed for pain/fever/headaches    [provider]  cephALEXin (KEFLEX) 500 MG capsule Take 1 capsule (500 mg total) by mouth 4 (four) times daily. 04/17/19   Isla Pence, MD  senna-docusate (SENOKOT-S) 8.6-50 MG tablet Take 1 tablet by mouth 2 (two) times daily. Patient not taking: Reported on 04/17/2019 03/25/19   Eugenie Filler, MD    Family History Family History  Problem Relation Age of Onset  . CVA Mother   . Hypertension Mother   . Lupus Sister   . Hypertension Brother   . Peptic Ulcer Disease Father   . Colon cancer Neg Hx     Social History Social History   Tobacco Use  . Smoking status: Current Every Day Smoker    Packs/day: 0.25    Years: 40.00    Pack years: 10.00    Types: Cigarettes  . Smokeless tobacco: Never Used  Substance Use Topics  . Alcohol use: No  . Drug use: No     Allergies   Sulfonamide derivatives   Review of Systems Review of Systems  Unable to perform ROS: Psychiatric disorder     Physical Exam Updated Vital Signs BP (!) 167/92 (BP Location: Left Arm)   Pulse 78   Temp 100 F (37.8 C) (Rectal)   Resp 16   SpO2 100%   Physical Exam Vitals signs and nursing note reviewed.  Constitutional:      Appearance: She is well-developed.  HENT:     Head: Normocephalic and atraumatic.     Right Ear: Ear canal normal.     Left Ear: Ear canal normal.     Mouth/Throat:     Mouth: Mucous membranes are dry.  Eyes:     Conjunctiva/sclera: Conjunctivae normal.     Pupils: Pupils are equal, round, and reactive to light.   Neck:     Musculoskeletal: Normal range of motion and neck supple.  Cardiovascular:     Rate and Rhythm: Normal rate and regular rhythm.     Heart sounds: Normal heart sounds.  Pulmonary:     Effort: Pulmonary effort is normal.     Breath sounds: Normal breath sounds.  Abdominal:     General: Bowel sounds are normal.  Palpations: Abdomen is soft.  Skin:    General: Skin is warm.     Capillary Refill: Capillary refill takes less than 2 seconds.  Neurological:     Mental Status: She is alert.     Comments: Pt is moving all 4 extremities.  She did talk to the NT and sounded well.    Psychiatric:        Speech: She is noncommunicative.      ED Treatments / Results  Labs (all labs ordered are listed, but only abnormal results are displayed) Labs Reviewed  BASIC METABOLIC PANEL - Abnormal; Notable for the following components:      Result Value   CO2 20 (*)    Glucose, Bld 115 (*)    Calcium 11.3 (*)    All other components within normal limits  URINALYSIS, ROUTINE W REFLEX MICROSCOPIC - Abnormal; Notable for the following components:   Leukocytes,Ua MODERATE (*)    RBC / HPF >50 (*)    All other components within normal limits  CBC WITH DIFFERENTIAL/PLATELET - Abnormal; Notable for the following components:   RBC 3.77 (*)    Hemoglobin 11.8 (*)    MCV 101.9 (*)    All other components within normal limits  GROUP A STREP BY PCR  SARS CORONAVIRUS 2 (HOSPITAL ORDER, [place] [facility])  URINE CULTURE  BRAIN NATRIURETIC PEPTIDE  LACTIC ACID, PLASMA  LACTIC ACID, PLASMA  TROPONIN I (HIGH SENSITIVITY)  TROPONIN I (HIGH SENSITIVITY)    EKG EKG Interpretation  Date/Time:  Thursday April 17 2019 17:18:47 EDT Ventricular Rate:  82 PR Interval:    QRS Duration: 154 QT Interval:  398 QTC Calculation: 465 R Axis:   -89 Text Interpretation:  Sinus rhythm RBBB and LAFB Inferior infarct, acute Lateral leads are also involved Since last tracing rate  faster Confirmed by Isla Pence [phone]) on 04/17/2019 5:54:50 PM   Radiology Dg Chest Port 1 View  Result Date: 04/17/2019 CLINICAL DATA:  Sore throat.  Current smoker. EXAM: PORTABLE CHEST 1 VIEW COMPARISON:  03/24/2019 FINDINGS: Lungs are adequately inflated without consolidation or effusion. Mild stable cardiomegaly. Single spinal is a shin rod over the spine unchanged. Stable curvature of the thoracic spine convex right. Remainder of the exam is unchanged. IMPRESSION: No acute cardiopulmonary disease. Electronically Signed   By: Marin Olp M.D.   On: 04/17/2019 20:35    Procedures Procedures (including critical care time)  Medications Ordered in ED Medications  cefTRIAXone (ROCEPHIN) 1 g in sodium chloride 0.9 % 100 mL IVPB (has no administration in time range)  acetaminophen (TYLENOL) tablet 1,000 mg (1,000 mg Oral Given 04/17/19 2015)  sodium chloride 0.9 % bolus 1,000 mL (1,000 mLs Intravenous New Bag/Given 04/17/19 2007)  LORazepam (ATIVAN) injection 1 mg (1 mg Intravenous Given 04/17/19 2004)     Initial Impression / Assessment and Plan / ED Course  I have reviewed the triage vital signs and the nursing notes.  Pertinent labs & imaging results that were available during my care of the patient were reviewed by me and considered in my medical decision making (see chart for details).    Pt looks much better.  She does have a uti which is treated with rocephin here and will be d/c with keflex.  Urine cx pending.  Pt stable for d/c.  LEISEL PINETTE was evaluated in Emergency Department on 04/17/2019 for the symptoms described in the history of present illness. She was evaluated in the context of  the global COVID-19 pandemic, which necessitated consideration that the patient might be at risk for infection with the SARS-CoV-2 virus that causes COVID-19. Institutional protocols and algorithms that pertain to the evaluation of patients at risk for COVID-19 are in a state of rapid  change based on information released by regulatory bodies including the CDC and federal and state organizations. These policies and algorithms were followed during the patient's care in the ED.  Final Clinical Impressions(s) / ED Diagnoses   Final diagnoses:  Covid-19 Virus not Detected  Acute cystitis with hematuria    ED Discharge Orders         Ordered    cephALEXin (KEFLEX) 500 MG capsule  4 times daily     04/17/19 2300           Isla Pence, MD 04/17/19 2302

## 2019-04-17 NOTE — ED Notes (Signed)
Patient returned from xray.

## 2019-04-17 NOTE — ED Notes (Signed)
Phone report given to [facility]

## 2019-04-17 NOTE — ED Triage Notes (Signed)
Per [facility] pt from [facility] of [place] for sore throat for unknown amount of time. Vitals: vitals: 150/87, 74HR, 94% on CBG 144, 97.5temp. staff at facility reports that patient is at her baseline, per EMS pt oriented to herself.

## 2019-04-18 DIAGNOSIS — N3001 Acute cystitis with hematuria: Secondary | ICD-10-CM | POA: Diagnosis not present

## 2019-04-18 LAB — URINE CULTURE: Culture: NO GROWTH

## 2019-12-05 ENCOUNTER — Other Ambulatory Visit: Payer: Self-pay

## 2019-12-05 ENCOUNTER — Encounter (HOSPITAL_COMMUNITY): Payer: Self-pay

## 2019-12-05 ENCOUNTER — Emergency Department (HOSPITAL_COMMUNITY): Payer: Medicare Other

## 2019-12-05 ENCOUNTER — Emergency Department (HOSPITAL_COMMUNITY)
Admission: EM | Admit: 2019-12-05 | Discharge: 2019-12-06 | Disposition: A | Discharge status: Home / Self Care | Payer: Medicare Other | Attending: Emergency Medicine | Admitting: Emergency Medicine

## 2019-12-05 DIAGNOSIS — R0682 Tachypnea, not elsewhere classified: Secondary | ICD-10-CM | POA: Insufficient documentation

## 2019-12-05 DIAGNOSIS — R000 Tachycardia, unspecified: Secondary | ICD-10-CM | POA: Diagnosis not present

## 2019-12-05 DIAGNOSIS — N390 Urinary tract infection, site not specified: Secondary | ICD-10-CM | POA: Insufficient documentation

## 2019-12-05 DIAGNOSIS — Z7982 Long term (current) use of aspirin: Secondary | ICD-10-CM | POA: Insufficient documentation

## 2019-12-05 DIAGNOSIS — R5383 Other fatigue: Secondary | ICD-10-CM | POA: Diagnosis not present

## 2019-12-05 DIAGNOSIS — Z79899 Other long term (current) drug therapy: Secondary | ICD-10-CM | POA: Diagnosis not present

## 2019-12-05 DIAGNOSIS — Z20822 Contact with and (suspected) exposure to covid-19: Secondary | ICD-10-CM | POA: Insufficient documentation

## 2019-12-05 DIAGNOSIS — F17210 Nicotine dependence, cigarettes, uncomplicated: Secondary | ICD-10-CM | POA: Insufficient documentation

## 2019-12-05 DIAGNOSIS — Z96642 Presence of left artificial hip joint: Secondary | ICD-10-CM | POA: Diagnosis not present

## 2019-12-05 DIAGNOSIS — I10 Essential (primary) hypertension: Secondary | ICD-10-CM | POA: Insufficient documentation

## 2019-12-05 DIAGNOSIS — R4182 Altered mental status, unspecified: Secondary | ICD-10-CM | POA: Diagnosis present

## 2019-12-05 LAB — COMPREHENSIVE METABOLIC PANEL
ALT: 22 U/L (ref 0–44)
AST: 24 U/L (ref 15–41)
Albumin: 4 g/dL (ref 3.5–5.0)
Alkaline Phosphatase: 80 U/L (ref 38–126)
Anion gap: 11 (ref 5–15)
BUN: 22 mg/dL (ref 8–23)
CO2: 19 mmol/L — ABNORMAL LOW (ref 22–32)
Calcium: 11.2 mg/dL — ABNORMAL HIGH (ref 8.9–10.3)
Chloride: 110 mmol/L (ref 98–111)
Creatinine, Ser: 0.99 mg/dL (ref 0.44–1.00)
GFR calc Af Amer: >60 mL/min (ref >60)
GFR calc non Af Amer: 59 mL/min — ABNORMAL LOW (ref >60)
Glucose, Bld: 94 mg/dL (ref 70–99)
Potassium: 3.9 mmol/L (ref 3.5–5.1)
Sodium: 140 mmol/L (ref 135–145)
Total Bilirubin: 0.8 mg/dL (ref 0.3–1.2)
Total Protein: 7.4 g/dL (ref 6.5–8.1)

## 2019-12-05 LAB — CBC
HCT: 43 % (ref 36.0–46.0)
Hemoglobin: 13.3 g/dL (ref 12.0–15.0)
MCH: 32.1 pg (ref 26.0–34.0)
MCHC: 30.9 g/dL (ref 30.0–36.0)
MCV: 103.9 fL — ABNORMAL HIGH (ref 80.0–100.0)
Platelets: 232 10*3/uL (ref 150–400)
RBC: 4.14 MIL/uL (ref 3.87–5.11)
RDW: 12.3 % (ref 11.5–15.5)
WBC: 9.5 10*3/uL (ref 4.0–10.5)
nRBC: 0 % (ref 0.0–0.2)

## 2019-12-05 LAB — URINALYSIS, ROUTINE W REFLEX MICROSCOPIC
Bilirubin Urine: NEGATIVE
Glucose, UA: NEGATIVE mg/dL
Ketones, ur: 20 mg/dL — AB
Nitrite: NEGATIVE
Protein, ur: 30 mg/dL — AB
Specific Gravity, Urine: 1.018 (ref 1.005–1.030)
WBC, UA: >50 WBC/hpf — ABNORMAL HIGH (ref 0–5)
pH: 6 (ref 5.0–8.0)

## 2019-12-05 LAB — LITHIUM LEVEL: Lithium Lvl: 0.78 mmol/L (ref 0.60–1.20)

## 2019-12-05 LAB — CBG MONITORING, ED: Glucose-Capillary: 98 mg/dL (ref 70–99)

## 2019-12-05 MED ORDER — ALBUTEROL SULFATE HFA 108 (90 BASE) MCG/ACT IN AERS
8.0000 | INHALATION_SPRAY | Freq: Once | RESPIRATORY_TRACT | Status: AC
  Administered 2019-12-05: 8 via RESPIRATORY_TRACT
  Filled 2019-12-05: qty 6.7

## 2019-12-05 MED ORDER — LACTATED RINGERS IV BOLUS
1000.0000 mL | Freq: Once | INTRAVENOUS | Status: AC
  Administered 2019-12-05: 1000 mL via INTRAVENOUS

## 2019-12-05 MED ORDER — CEPHALEXIN 500 MG PO CAPS: 500.0000 mg | ORAL_CAPSULE | Freq: Two times a day (BID) | ORAL | 0 refills | Status: AC

## 2019-12-05 MED ORDER — IOHEXOL 350 MG/ML SOLN
100.0000 mL | Freq: Once | INTRAVENOUS | Status: AC | PRN
  Administered 2019-12-05: 100 mL via INTRAVENOUS

## 2019-12-05 MED ORDER — AEROCHAMBER PLUS FLO-VU LARGE MISC
Freq: Once | Status: AC
  Administered 2019-12-05: 1

## 2019-12-05 MED ORDER — CEPHALEXIN 250 MG PO CAPS
500.0000 mg | ORAL_CAPSULE | Freq: Once | ORAL | Status: AC
  Administered 2019-12-05: 500 mg via ORAL
  Filled 2019-12-05: qty 2

## 2019-12-05 MED ORDER — AEROCHAMBER PLUS FLO-VU MEDIUM MISC
1.0000 | Freq: Once | Status: DC
  Filled 2019-12-05: qty 1

## 2019-12-05 NOTE — ED Notes (Signed)
Pt's facility [facility] contacted about pt's discharge. Spoke with Med tech Dorothy. Pt and facility verbalizes understanding of d/c instructions. Prescriptions reviewed with patient/facility. Pt awaiting PT[facility] for discharge transportation. All belongings at bedside.

## 2019-12-05 NOTE — ED Notes (Signed)
Pt transported to CT at this time.

## 2019-12-05 NOTE — Discharge Instructions (Signed)
If she develops fever, vomiting, change in mental status, or any other new/concerning symptoms and return to the ER for evaluation.

## 2019-12-05 NOTE — ED Triage Notes (Signed)
Pt from [facility] home for AMS. LSN 10am yesterday, staff reports she received an injection Friday and is normally confused afterwards but is more confused this time. Pt resposive to voice, eyes open. VSS

## 2019-12-05 NOTE — ED Provider Notes (Signed)
Care transferred to me.  Patient's CT does not show any acute PE or other acute pulmonary abnormality.  No obvious pneumonia.  Pleural effusion and pericardial effusion are stable since summer 2020.  At this point, she is not complaining of shortness of breath and is not hypoxic.  I think probably this is medication related and or possibly UTI.  Urine sent for culture and I discussed with son Jahnaya Cohill that we will treat with antibiotics and otherwise continue to watch at her nursing facility and return if symptoms worsen.   Sherwood Gambler, MD 12/05/19 2117

## 2019-12-05 NOTE — ED Provider Notes (Signed)
[facility] EMERGENCY DEPARTMENT Provider Note   CSN: [id number] Arrival date & time: 12/05/19  1036     History Chief Complaint  Patient presents with  . Altered Mental Status    MAEBELLE DIMMICK is a 69 y.o. female.  Patient is a 69 year old female with a history of hypertension, schizoaffective disorder, recurrent UTIs and sepsis who lives in a facility presenting today with EMS for altered mental status.  Patient is coming from [facility] home and they reports she was last seen normal at 10 AM yesterday.  They feel that she has been a lot more confused than her baseline.  Patient does receive Risperdal injections every 14 days which she received on Friday and they thought that maybe it was related to the medication but felt that her symptoms were worsening.  The history is provided by the EMS personnel and the nursing home. The history is limited by the absence of a caregiver.  Altered Mental Status Presenting symptoms: behavior changes and lethargy   Severity:  Moderate Most recent episode:  Yesterday Episode history:  Continuous Timing:  Constant Progression:  Worsening Chronicity:  New Context: nursing home resident        Past Medical History:  Diagnosis Date  . Anxiety   . Arrhythmia   . Bifascicular block   . Depression   . Hypertension   . Insomnia   . Schizoaffective disorder   . Scoliosis   . Vitamin D deficiency     Patient Active Problem List   Diagnosis Date Noted  . Bradycardia   . Acute metabolic encephalopathy   . Sepsis secondary to UTI ([place]) 03/20/2019  . Closed displaced fracture of left femoral neck ([place]) 09/26/2018  . AKI (acute kidney injury) ([place]) 06/13/2016  . UTI (urinary tract infection) 06/13/2016  . Essential hypertension 06/13/2016  . Urinary tract infection without hematuria   . Complicated UTI (urinary tract infection) 05/03/2016  . Hypercalcemia 05/03/2016  . Dehydration 05/03/2016  . Acute  encephalopathy 05/03/2016  . 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 Schizoaffective disorder ([place]) 05/03/2016  . Bifascicular block 05/03/2016    Past Surgical History:  Procedure Laterality Date  . BACK SURGERY     "as a child after she fell out of a tree"  . TONSILLECTOMY    . 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     Family History  Problem Relation Age of Onset  . CVA Mother   . Hypertension Mother   . Lupus Sister   . Hypertension Brother   . Peptic Ulcer Disease Father   . Colon cancer Neg Hx     Social History   Tobacco Use  . Smoking status: Current Every Day Smoker    Packs/day: 0.25    Years: 40.00    Pack years: 10.00    Types: Cigarettes  . Smokeless tobacco: Never Used  Substance Use Topics  . Alcohol use: No  . Drug use: No    Home Medications Prior to Admission medications   Medication Sig Start Date End Date Taking? Authorizing Provider  acetaminophen (TYLENOL) 325 MG tablet Take 650 mg by mouth every 4 (four) hours as needed for mild pain, moderate pain, fever or headache.    [provider]  acetaminophen (TYLENOL) 500  MG tablet Take 500 mg by mouth See admin instructions. Take 500 mg by mouth at 9 AM every morning and 650 mg every 4 hours as needed for pain/fever/headaches    [provider]  aspirin EC 81 MG EC tablet Take 1 tablet (81 mg total) by mouth daily. 05/06/16   Doreatha Lew, MD  benztropine (COGENTIN) 1 MG tablet Take 1 mg by mouth 2 (two) times daily.    [provider]  cephALEXin (KEFLEX) 500 MG capsule Take 1 capsule (500 mg total) by mouth 4 (four) times daily. 04/17/19   Isla Pence, MD  Cranberry 425 MG CAPS Take 425 mg by mouth 2 (two) times a day.    [provider]  ferrous  [id number] C-folic acid (FEROCON) capsule Take 1 capsule by mouth at bedtime.    [provider]  fluPHENAZine (PROLIXIN) 10 MG tablet Take 20 mg by mouth daily.     [provider]  LATUDA 120 MG TABS Take 120 mg by mouth daily. 09/11/18   [provider]  lisinopril (ZESTRIL) 10 MG tablet Take 1 tablet (10 mg total) by mouth daily. Hold if Systolic b/p [id number]   Eugenie Filler, MD  lithium carbonate 150 MG capsule Take 150-300 mg by mouth See admin instructions. Take 300 mg(2 capsules) by mouth in the morning and 150 mg (1 capsule) at bedtime 09/11/18   [provider]  loratadine (CLARITIN) 10 MG tablet Take 10 mg by mouth daily.    [provider]  LORazepam (ATIVAN) 0.5 MG tablet Take 1 tablet (0.5 mg total) by mouth 4 (four) times daily. 10/03/18   Oretha Milch D, MD  Melatonin 5 MG TABS Take 5 mg by mouth at bedtime.    [provider]  polyethylene glycol powder (GAVILAX) 17 GM/SCOOP powder Take 17 g by mouth daily. MIX INTO 8 OUNCES OF FLUID, MIX, AND DRINK    [provider]  Pramoxine HCl (VAGISIL ANTI-ITCH MEDICATED) 1 % MISC Apply vaginally once a day Apply 1 application topically daily as needed (for vaginal itching). Apply vaginally once a day 03/25/19   Eugenie Filler, MD  Propylene Glycol (SYSTANE BALANCE) 0.6 % SOLN Place 1 drop into both eyes 3 (three) times daily.     [provider]  risperiDONE microspheres (RISPERDAL CONSTA) 50 MG injection Inject 50 mg into the muscle every 14 (fourteen) days.    [provider]  senna-docusate (SENOKOT-S) 8.6-50 MG tablet Take 1 tablet by mouth 2 (two) times daily. Patient not taking: Reported on 04/17/2019 03/25/19   Eugenie Filler, MD  sertraline (ZOLOFT) 100 MG tablet Take 100 mg by mouth daily.     [provider]    Allergies    Sulfonamide derivatives  Review of Systems   Review of Systems  Unable to perform ROS: Mental  status change  All other systems reviewed and are negative.   Physical Exam Updated Vital Signs BP 127/81   Pulse 96   Temp 99.1 F (37.3 C) (Rectal)   Resp (!) 31   Ht 5\' 5"  (1.651 m)   Wt 65.2 kg   SpO2 95%   BMI 23.92 kg/m   Physical Exam Vitals and nursing note reviewed.  Constitutional:      General: She is in acute distress.     Appearance: She is well-developed.  HENT:     Head: Normocephalic and atraumatic.  Eyes:     Pupils: Pupils are equal, round,  and reactive to light.  Cardiovascular:     Rate and Rhythm: Regular rhythm. Tachycardia present.     Heart sounds: Normal heart sounds. No murmur. No friction rub.  Pulmonary:     Effort: Pulmonary effort is normal. Tachypnea present.     Breath sounds: Normal breath sounds. No wheezing or rales.     Comments: Coarse breath sounds noted in the upper lobes Abdominal:     General: Bowel sounds are normal. There is no distension.     Palpations: Abdomen is soft.     Tenderness: There is no abdominal tenderness. There is no guarding or rebound.  Musculoskeletal:        General: No tenderness. Normal range of motion.     Right lower leg: No edema.     Left lower leg: No edema.     Comments: No edema  Skin:    General: Skin is warm and dry.     Findings: No rash.  Neurological:     Mental Status: She is alert.     Comments: SamePatient is able to move arms and legs without difficulty and does immediately look at you when you say her name but is unable to communicate  Psychiatric:     Comments: Patient is calm but unclear what her baseline is     ED Results / Procedures / Treatments   Labs (all labs ordered are listed, but only abnormal results are displayed) Labs Reviewed  COMPREHENSIVE METABOLIC PANEL - Abnormal; Notable for the following components:      Result Value   CO2 19 (*)    Calcium 11.2 (*)    GFR calc non Af Amer 59 (*)    All other components within normal limits  CBC - Abnormal; Notable for  the following components:   MCV 103.9 (*)    All other components within normal limits  URINALYSIS, ROUTINE W REFLEX MICROSCOPIC - Abnormal; Notable for the following components:   APPearance CLOUDY (*)    Hgb urine dipstick SMALL (*)    Ketones, ur 20 (*)    Protein, ur 30 (*)    Leukocytes,Ua TRACE (*)    WBC, UA >50 (*)    Bacteria, UA FEW (*)    All other components within normal limits  URINE CULTURE  SARS CORONAVIRUS 2 (TAT 6-24 HRS)  LITHIUM LEVEL  CBG MONITORING, ED    EKG EKG Interpretation  Date/Time:  Friday December 05 2019 11:11:11 EDT Ventricular Rate:  98 PR Interval:    QRS Duration: 155 QT Interval:  401 QTC Calculation: 512 R Axis:   -89 Text Interpretation: Sinus rhythm RBBB and LAFB Inferior infarct, acute Lateral leads are also involved No significant change since last tracing or since 2009 Confirmed by Blanchie Dessert [phone]) on 12/05/2019 11:13:36 AM   Radiology CT Head Wo Contrast  Result Date: 12/05/2019 CLINICAL DATA:  Confusion/altered mental status EXAM: CT HEAD WITHOUT CONTRAST TECHNIQUE: Contiguous axial images were obtained from the base of the skull through the vertex without intravenous contrast. COMPARISON:  March 19, 2019 FINDINGS: Brain: Age related volume loss is stable. There is no intracranial mass, hemorrhage, extra-axial fluid collection, or midline shift. There is small vessel disease in the centra semiovale bilaterally, stable. No acute infarct evident. Vascular: There is no hyperdense vessel. There is calcification in each carotid siphon and distal left vertebral artery region. Skull: Bony calvarium a appears intact. Sinuses/Orbits: There is mild mucosal thickening in each maxillary antrum. There is mucosal thickening  in several ethmoid air cells. Orbits appear symmetric bilaterally. Other: Mastoid air cells are clear. There is debris in each external auditory canal. IMPRESSION: Age related volume loss with patchy periventricular small vessel  disease. No acute infarct. No mass or hemorrhage. There are foci of arterial vascular calcification. There are foci mucosal thickening in paranasal sinus regions. There is probable cerumen in each external auditory canal. Electronically Signed   By: Lowella Grip III M.D.   On: 12/05/2019 11:36   DG Chest Port 1 View  Result Date: 12/05/2019 CLINICAL DATA:  Confusion/altered mental status EXAM: PORTABLE CHEST 1 VIEW COMPARISON:  April 17, 2019. FINDINGS: Ill-defined airspace opacity is noted in the right base. Lungs elsewhere are clear. Heart is upper normal in size with pulmonary vascularity normal. No adenopathy. There is aortic atherosclerosis. There is midthoracic dextroscoliosis with rotatory component. Fixation rod present in this region. IMPRESSION: Small area of ill defined airspace opacity in the right base, likely developing focus of pneumonia. Lungs elsewhere clear. Stable cardiac silhouette. Scoliosis present. Aortic Atherosclerosis (ICD10-I70.0). Electronically Signed   By: Lowella Grip III M.D.   On: 12/05/2019 11:33    Procedures Procedures (including critical care time)  Medications Ordered in ED Medications - No data to display  ED Course  I have reviewed the triage vital signs and the nursing notes.  Pertinent labs & imaging results that were available during my care of the patient were reviewed by me and considered in my medical decision making (see chart for details).    MDM Rules/Calculators/A&P                      Patient presenting today with EMS for altered mental status from her nursing facility.  Patient is staring to the right and does immediately look at you when her name is said but is unable to communicate.  It is unclear what her baseline is at this time.  She is afebrile with normal blood pressure and oxygen saturations.  Mildly tachycardic here.  She has no localized areas of pain.  Will proceed with altered mental status work-up including urine, blood  work, imaging and will reevaluate.  EKG shows a right bundle branch block without acute changes from prior EKGs.  No evidence of fluid overload today or acute EKG changes concerning for acute cardiac pathology.  Given rhonchorous breath sounds we will do a chest x-ray to ensure no new pneumonia.  Also because patient is on lithium will ensure that she is not toxic.  2:24 PM Patient's son is now present and reports that on Friday when she got her Risperdal they actually increase the dose because she was having hallucinations.  He reports that after she gets the injection she is always groggy and not herself but this has lasted longer than what has been typical in the past.  When he arrived today she did tell him she was thirsty and she wanted to drink but he states she still seems much more lethargic than her baseline which is up walking around at the facility able to feed herself and you can usually understand what she is saying.  As far she knows there is been no other medication changes.  She did eat yesterday but has had nothing today.  Will give a liter of fluid as it seems like patient has not had much oral intake and she has been minimally tachycardic in the low 100s and upper 90s.  Patient's CMP is within normal limits  with a mildly elevated calcium, CBC without acute findings, lithium level is within normal limits, head CT without acute findings and chest x-ray with a small area of ill-defined airspace opacity in the right base which could be a developing pneumonia.  However patient has not had a cough has no white count or fever at this time.  Low suspicion for Covid.  4:03 PM Patient's UA with possible early infection with greater than 50 white blood cells but only few bacteria and a culture was done.  On repeat evaluation patient's heart rate has improved with fluid but she reports that she is short of breath.  She is now on 2 L of oxygen it is unclear if she was hypoxic at some point.  She still  has some mild rhonchi on lung exam and will do albuterol inhaler as she does have a history of tobacco abuse but we will also do a CTA to rule out PE and to further evaluate if patient truly has infection.  Patient checked out to Dr. Regenia Skeeter at 4 PM.  MDM Number of Diagnoses or Management Options   Amount and/or Complexity of Data Reviewed Clinical lab tests: ordered and reviewed Tests in the radiology section of CPT: ordered and reviewed Tests in the medicine section of CPT: ordered and reviewed Discussion of test results with the performing providers: yes Decide to obtain previous medical records or to obtain history from someone other than the patient: yes Obtain history from someone other than the patient: yes Review and summarize past medical records: yes Discuss the patient with other providers: no Independent visualization of images, tracings, or specimens: yes  Risk of Complications, Morbidity, and/or Mortality Presenting problems: moderate Diagnostic procedures: minimal Management options: low  Patient Progress Patient progress: stable    Final Clinical Impression(s) / ED Diagnoses Final diagnoses:  None    Rx / DC Orders ED Discharge Orders    None       Blanchie Dessert, MD 12/05/19 1604

## 2019-12-05 NOTE — ED Notes (Signed)
Patient given ice water 

## 2019-12-06 LAB — SARS CORONAVIRUS 2 (TAT 6-24 HRS): SARS Coronavirus 2: NEGATIVE

## 2019-12-07 LAB — URINE CULTURE: Culture: >=100000 — AB

## 2019-12-08 ENCOUNTER — Telehealth: Payer: Self-pay | Admitting: *Deleted

## 2019-12-08 NOTE — Telephone Encounter (Signed)
Post ED Visit - Positive Culture Follow-up  Culture report reviewed by antimicrobial stewardship pharmacist: [place] Team []  Elenor Quinones, Pharm.D. []  Heide Guile, Pharm.D., BCPS AQ-ID []  Parks Neptune, Pharm.D., BCPS []  Alycia Rossetti, Pharm.D., BCPS []  [place], Pharm.D., BCPS, AAHIVP []  Legrand Como, Pharm.D., BCPS, AAHIVP []  Salome Arnt, PharmD, BCPS []  Johnnette Gourd, PharmD, BCPS []  Hughes Better, PharmD, BCPS []  Leeroy Cha, PharmD []  Laqueta Linden, PharmD, BCPS []  Albertina Parr, PharmD  [place] Team []  Leodis Sias, PharmD []  Lindell Spar, PharmD []  Royetta Asal, PharmD []  Graylin Shiver, Rph []  Rema Fendt) Glennon Mac, PharmD []  Arlyn Dunning, PharmD []  Netta Cedars, PharmD []  Dia Sitter, PharmD []  Leone Haven, PharmD []  Gretta Arab, PharmD []  Theodis Shove, PharmD []  Peggyann Juba, PharmD []  Reuel Boom, PharmD   Positive urine culture Treated with Cephalexin. Spoke in follow up with America Brown, Director of [facility] who states patient is improved and  no further patient follow-up is required at this time.  Harlon Flor [facility] 12/08/2019, 10:00 AM

## 2020-06-20 ENCOUNTER — Emergency Department (HOSPITAL_COMMUNITY)
Admission: EM | Admit: 2020-06-20 | Discharge: 2020-06-21 | Disposition: A | Discharge status: Home / Self Care | Payer: Medicare Other | Attending: Emergency Medicine | Admitting: Emergency Medicine

## 2020-06-20 ENCOUNTER — Emergency Department (HOSPITAL_COMMUNITY): Payer: Medicare Other

## 2020-06-20 ENCOUNTER — Encounter (HOSPITAL_COMMUNITY): Payer: Self-pay

## 2020-06-20 ENCOUNTER — Other Ambulatory Visit: Payer: Self-pay

## 2020-06-20 DIAGNOSIS — F0390 Unspecified dementia without behavioral disturbance: Secondary | ICD-10-CM | POA: Diagnosis not present

## 2020-06-20 DIAGNOSIS — Z20822 Contact with and (suspected) exposure to covid-19: Secondary | ICD-10-CM | POA: Insufficient documentation

## 2020-06-20 DIAGNOSIS — Z79899 Other long term (current) drug therapy: Secondary | ICD-10-CM | POA: Insufficient documentation

## 2020-06-20 DIAGNOSIS — R531 Weakness: Secondary | ICD-10-CM

## 2020-06-20 DIAGNOSIS — I10 Essential (primary) hypertension: Secondary | ICD-10-CM | POA: Diagnosis not present

## 2020-06-20 DIAGNOSIS — F17210 Nicotine dependence, cigarettes, uncomplicated: Secondary | ICD-10-CM | POA: Diagnosis not present

## 2020-06-20 DIAGNOSIS — Z7982 Long term (current) use of aspirin: Secondary | ICD-10-CM | POA: Diagnosis not present

## 2020-06-20 DIAGNOSIS — Z96642 Presence of left artificial hip joint: Secondary | ICD-10-CM | POA: Diagnosis not present

## 2020-06-20 DIAGNOSIS — N3000 Acute cystitis without hematuria: Secondary | ICD-10-CM | POA: Diagnosis not present

## 2020-06-20 LAB — COMPREHENSIVE METABOLIC PANEL
ALT: 33 U/L (ref 0–44)
AST: 36 U/L (ref 15–41)
Albumin: 4.2 g/dL (ref 3.5–5.0)
Alkaline Phosphatase: 82 U/L (ref 38–126)
Anion gap: 8 (ref 5–15)
BUN: 14 mg/dL (ref 8–23)
CO2: 22 mmol/L (ref 22–32)
Calcium: 10.3 mg/dL (ref 8.9–10.3)
Chloride: 110 mmol/L (ref 98–111)
Creatinine, Ser: 0.95 mg/dL (ref 0.44–1.00)
GFR, Estimated: >60 mL/min (ref >60)
Glucose, Bld: 109 mg/dL — ABNORMAL HIGH (ref 70–99)
Potassium: 5.6 mmol/L — ABNORMAL HIGH (ref 3.5–5.1)
Sodium: 140 mmol/L (ref 135–145)
Total Bilirubin: 0.9 mg/dL (ref 0.3–1.2)
Total Protein: 7.5 g/dL (ref 6.5–8.1)

## 2020-06-20 LAB — CBC WITH DIFFERENTIAL/PLATELET
Abs Immature Granulocytes: 0.04 10*3/uL (ref 0.00–0.07)
Basophils Absolute: 0.1 10*3/uL (ref 0.0–0.1)
Basophils Relative: 1 %
Eosinophils Absolute: 0.3 10*3/uL (ref 0.0–0.5)
Eosinophils Relative: 4 %
HCT: 39.6 % (ref 36.0–46.0)
Hemoglobin: 12.4 g/dL (ref 12.0–15.0)
Immature Granulocytes: 1 %
Lymphocytes Relative: 31 %
Lymphs Abs: 2.3 10*3/uL (ref 0.7–4.0)
MCH: 31.2 pg (ref 26.0–34.0)
MCHC: 31.3 g/dL (ref 30.0–36.0)
MCV: 99.7 fL (ref 80.0–100.0)
Monocytes Absolute: 0.7 10*3/uL (ref 0.1–1.0)
Monocytes Relative: 9 %
Neutro Abs: 3.9 10*3/uL (ref 1.7–7.7)
Neutrophils Relative %: 54 %
Platelets: 272 10*3/uL (ref 150–400)
RBC: 3.97 MIL/uL (ref 3.87–5.11)
RDW: 12.8 % (ref 11.5–15.5)
WBC: 7.2 10*3/uL (ref 4.0–10.5)
nRBC: 0 % (ref 0.0–0.2)

## 2020-06-20 LAB — URINALYSIS, ROUTINE W REFLEX MICROSCOPIC
Bilirubin Urine: NEGATIVE
Glucose, UA: NEGATIVE mg/dL
Hgb urine dipstick: NEGATIVE
Ketones, ur: NEGATIVE mg/dL
Nitrite: NEGATIVE
Protein, ur: NEGATIVE mg/dL
Specific Gravity, Urine: 1.01 (ref 1.005–1.030)
pH: 6 (ref 5.0–8.0)

## 2020-06-20 LAB — RESPIRATORY PANEL BY RT PCR (FLU A&B, COVID)
Influenza A by PCR: NEGATIVE
Influenza B by PCR: NEGATIVE
SARS Coronavirus 2 by RT PCR: NEGATIVE

## 2020-06-20 LAB — POTASSIUM: Potassium: 4.6 mmol/L (ref 3.5–5.1)

## 2020-06-20 LAB — CBG MONITORING, ED: Glucose-Capillary: 104 mg/dL — ABNORMAL HIGH (ref 70–99)

## 2020-06-20 LAB — LACTIC ACID, PLASMA
Lactic Acid, Venous: 1.1 mmol/L (ref 0.5–1.9)
Lactic Acid, Venous: 1 mmol/L (ref 0.5–1.9)

## 2020-06-20 LAB — LITHIUM LEVEL: Lithium Lvl: 0.74 mmol/L (ref 0.60–1.20)

## 2020-06-20 MED ORDER — CEPHALEXIN 500 MG PO CAPS: 500.0000 mg | ORAL_CAPSULE | Freq: Two times a day (BID) | ORAL | 0 refills | Status: AC

## 2020-06-20 MED ORDER — ACETAMINOPHEN 325 MG PO TABS
650.0000 mg | ORAL_TABLET | Freq: Once | ORAL | Status: AC
  Administered 2020-06-20: 650 mg via ORAL
  Filled 2020-06-20: qty 2

## 2020-06-20 NOTE — ED Triage Notes (Addendum)
Pt BIB EMS from [facility]. Facility reports pt is experiencing weakness. EMS reports pt is verbal but not good historian. Facility reports pt may have a UTI. Pt has hx of schizophrenia and takes meds for it.   146/86 HR 82 EMS reports 1st degree on monitor.  98.4 temp 98% RA

## 2020-06-20 NOTE — ED Notes (Signed)
[facility] called for transport.  

## 2020-06-20 NOTE — Discharge Instructions (Signed)
Please take the antibiotic as prescribed.  Return to the ER for any new or worsening symptoms.

## 2020-06-20 NOTE — ED Provider Notes (Addendum)
[facility] DEPT Provider Note   CSN: [id number] Arrival date & time: 06/20/20  1650     History Chief Complaint  Patient presents with  . Weakness    Heidi Blevins is a 69 y.o. female.  HPI  LEVEL 5 CAVEAT 2/2 to Dementia, altered mental status  69 year old female with a history of hypertension, depression, schizoaffective disorder on lithium, frequent UTIs who presents from [facility] in [place] with complaints of generalized weakness, not getting out of bed, poor appetite, not talking and at times unbalanced the last 4 days.  Her son Heidi Blevins visited her today and wanted her sent to the ER for further evaluation.  I did speak to the facility and her son who states that this is consistent with her prior UTI presentations.  They deny any fevers, chills, cough, or any other infectious symptoms.      Past Medical History:  Diagnosis Date  . Anxiety   . Arrhythmia   . Bifascicular block   . Depression   . Hypertension   . Insomnia   . Schizoaffective disorder   . Scoliosis   . Vitamin D deficiency     Patient Active Problem List   Diagnosis Date Noted  . Bradycardia   . Acute metabolic encephalopathy   . Sepsis secondary to UTI ([place]) 03/20/2019  . Closed displaced fracture of left femoral neck ([place]) 09/26/2018  . AKI (acute kidney injury) ([place]) 06/13/2016  . UTI (urinary tract infection) 06/13/2016  . Essential hypertension 06/13/2016  . Urinary tract infection without hematuria   . Complicated UTI (urinary tract infection) 05/03/2016  . Hypercalcemia 05/03/2016  . Dehydration 05/03/2016  . Acute encephalopathy 05/03/2016  . 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 Schizoaffective disorder ([place]) 05/03/2016  . Bifascicular block 05/03/2016    Past Surgical History:  Procedure Laterality Date  . BACK SURGERY     "as a child after she fell out of a tree"  . TONSILLECTOMY    . 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     Family History  Problem Relation Age of Onset  . CVA Mother   . Hypertension Mother   . Lupus Sister   . Hypertension Brother   . Peptic Ulcer Disease Father   . Colon cancer Neg Hx     Social History   Tobacco Use  . Smoking status: Current Every Day Smoker    Packs/day: 0.25    Years: 40.00    Pack years: 10.00    Types: Cigarettes  . Smokeless tobacco: Never Used  Substance Use Topics  . Alcohol use: No  . Drug use: No    Home Medications Prior to Admission medications   Medication Sig Start Date End Date Taking? Authorizing Provider  acetaminophen (TYLENOL) 325 MG tablet Take 650 mg by mouth every 4 (four) hours as needed for mild pain, moderate pain, fever or headache.   Yes [provider]  acetaminophen (TYLENOL) 500 MG tablet Take 500 mg by mouth daily.    Yes [provider]  aspirin EC 81 MG EC tablet Take 1 tablet (81 mg total) by mouth daily. 05/06/16  Yes Patrecia Pour, Christean Grief, MD  baclofen (LIORESAL) 20 MG tablet Take 20 mg by mouth 3 (three) times daily as needed for  muscle spasms.    Yes [provider]  benztropine (COGENTIN) 1 MG tablet Take 1 mg by mouth 2 (two) times daily.   Yes [provider]  Cranberry 425 MG CAPS Take 425 mg by mouth 2 (two) times a day.   Yes [provider]  ferrous [id number] C-folic acid (FEROCON) capsule Take 1 capsule by mouth at bedtime.   Yes [provider]  fluPHENAZine (PROLIXIN) 10 MG tablet Take 20 mg by mouth daily.    Yes [provider]  lisinopril (ZESTRIL) 20 MG tablet Take 20 mg by mouth daily.   Yes [provider]  lithium carbonate 150 MG capsule Take 150-300 mg by mouth See admin instructions. Takes 2 capsules (300 mg) by mouth in the  morning and 1 capsule (150 mg) at bedtime. 09/11/18  Yes [provider]  loratadine (CLARITIN) 10 MG tablet Take 10 mg by mouth daily.   Yes [provider]  LORazepam (ATIVAN) 0.5 MG tablet Take 1 tablet (0.5 mg total) by mouth 4 (four) times daily. 10/03/18  Yes Desiree Hane, MD  Lurasidone HCl (LATUDA) 120 MG TABS Take 120 mg by mouth daily.   Yes [provider]  Melatonin 5 MG TABS Take 5 mg by mouth at bedtime.   Yes [provider]  paliperidone (INVEGA SUSTENNA) 156 MG/ML SUSY injection Inject 156 mg into the muscle every 30 (thirty) days.   Yes [provider]  polyethylene glycol powder (GAVILAX) 17 GM/SCOOP powder Take 17 g by mouth daily. Mix in 8 oz of fluid and drink by mouth.   Yes [provider]  Pramoxine HCl (VAGISIL ANTI-ITCH MEDICATED) 1 % MISC Apply vaginally once a day Apply 1 application topically daily as needed (for vaginal itching). Apply vaginally once a day Patient taking differently: Apply 1 application topically daily as needed (for vaginal itching).  03/25/19  Yes Eugenie Filler, MD  Propylene Glycol (SYSTANE BALANCE) 0.6 % SOLN Place 1 drop into both eyes 3 (three) times daily.    Yes [provider]  sertraline (ZOLOFT) 100 MG tablet Take 100 mg by mouth daily.    Yes [provider]  cephALEXin (KEFLEX) 500 MG capsule Take 500 mg by mouth every 12 (twelve) hours.    [provider]  lisinopril (ZESTRIL) 10 MG tablet Take 1 tablet (10 mg total) by mouth daily. Hold if Systolic b/p <166 Patient not taking: Reported on 06/20/2020 03/25/19   Eugenie Filler, MD    Allergies    Sulfonamide derivatives  Review of Systems   Review of Systems  Physical Exam Updated Vital Signs BP 140/85   Pulse 73   Temp 99.2 F (37.3 C) (Oral)   Resp 19   Ht 5\' 5"  (1.651 m)   SpO2 97%   BMI 23.92 kg/m   Physical Exam Vitals and nursing note reviewed.  Constitutional:      General:  She is not in acute distress.    Appearance: Normal appearance. She is well-developed.  HENT:     Head: Normocephalic and atraumatic.     Mouth/Throat:     Mouth: Mucous membranes are moist.     Pharynx: Oropharynx is clear.  Eyes:     Conjunctiva/sclera: Conjunctivae normal.  Cardiovascular:     Rate and Rhythm: Normal rate and regular rhythm.     Heart sounds: No murmur heard.   Pulmonary:     Effort: Pulmonary effort is normal. No respiratory distress.  Breath sounds: Normal breath sounds.  Abdominal:     Palpations: Abdomen is soft.     Tenderness: There is no abdominal tenderness.  Musculoskeletal:        General: No swelling or tenderness.     Cervical back: Neck supple.     Right lower leg: No edema.     Left lower leg: No edema.  Skin:    General: Skin is warm and dry.     Capillary Refill: Capillary refill takes less than 2 seconds.  Neurological:     General: No focal deficit present.     Mental Status: She is alert.     Comments: Alert, however not speaking, will follow basic commands but difficult to assess neuro status.  No focal weakness noted, no facial droop, PERRLA      ED Results / Procedures / Treatments   Labs (all labs ordered are listed, but only abnormal results are displayed) Labs Reviewed  COMPREHENSIVE METABOLIC PANEL - Abnormal; Notable for the following components:      Result Value   Potassium 5.6 (*)    Glucose, Bld 109 (*)    All other components within normal limits  URINALYSIS, ROUTINE W REFLEX MICROSCOPIC - Abnormal; Notable for the following components:   Leukocytes,Ua SMALL (*)    Bacteria, UA RARE (*)    All other components within normal limits  CBG MONITORING, ED - Abnormal; Notable for the following components:   Glucose-Capillary 104 (*)    All other components within normal limits  RESPIRATORY PANEL BY RT PCR (FLU A&B, COVID)  CULTURE, BLOOD (ROUTINE X 2)  CULTURE, BLOOD (ROUTINE X 2)  URINE CULTURE  CBC WITH  DIFFERENTIAL/PLATELET  LACTIC ACID, PLASMA  LITHIUM LEVEL  LACTIC ACID, PLASMA  POTASSIUM    EKG EKG Interpretation  Date/Time:  Sunday June 20 2020 17:12:26 EDT Ventricular Rate:  73 PR Interval:    QRS Duration: 152 QT Interval:  418 QTC Calculation: 461 R Axis:   -77 Text Interpretation: Sinus rhythm RBBB and LAFB No significant change since last tracing Confirmed by Calvert Cantor [phone]) on 06/20/2020 5:15:18 PM   Radiology CT Head Wo Contrast  Result Date: 06/20/2020 CLINICAL DATA:  69 year old female with altered mental status. EXAM: CT HEAD WITHOUT CONTRAST TECHNIQUE: Contiguous axial images were obtained from the base of the skull through the vertex without intravenous contrast. COMPARISON:  12/05/2019 CT and prior studies FINDINGS: Brain: No evidence of acute infarction, hemorrhage, hydrocephalus, extra-axial collection or mass lesion/mass effect. Chronic small-vessel white matter ischemic changes again noted. Vascular: Carotid atherosclerotic calcifications are noted. Skull: Normal. Negative for fracture or focal lesion. Sinuses/Orbits: No acute finding. Other: None. IMPRESSION: 1. No evidence of acute intracranial abnormality. 2. Chronic small-vessel white matter ischemic changes. Electronically Signed   By: Margarette Canada M.D.   On: 06/20/2020 18:18   DG Chest Portable 1 View  Result Date: 06/20/2020 CLINICAL DATA:  Weakness and cough. EXAM: PORTABLE CHEST 1 VIEW COMPARISON:  12/05/2019 and prior radiographs FINDINGS: Cardiomegaly again noted. There is no evidence of focal airspace disease, pulmonary edema, suspicious pulmonary nodule/mass, pleural effusion, or pneumothorax. No acute bony abnormalities are identified. Severe thoracic scoliosis and spinal fixation rods again noted. IMPRESSION: Cardiomegaly without evidence of acute cardiopulmonary disease. Electronically Signed   By: Margarette Canada M.D.   On: 06/20/2020 18:07    Procedures Procedures (including critical  care time)  Medications Ordered in ED Medications  acetaminophen (TYLENOL) tablet 650 mg (650 mg Oral Given 06/20/20  35)    ED Course  I have reviewed the triage vital signs and the nursing notes.  Pertinent labs & imaging results that were available during my care of the patient were reviewed by me and considered in my medical decision making (see chart for details).    MDM Rules/Calculators/A&P                         69 year old female with weakness, not eating, not speaking.  She is alert, however not answering questions, will follow basic commands.  No noticeable focal neuro deficits.  Vitals with no significant abnormalities, however rectal temp did show temperature of 100.2.  This improved after Tylenol.  Her CBC is without leukocytosis, CMP originally with a potassium of 5.6, however after recheck this was 4.6.  Normal renal and hepatic function.  Covid is negative.  Serial lactates negative.  Glucose of 104.  CT of the head without any acute changes. Low suspicion for stroke.  Chest x-ray without evidence of infection.  EKG largely unchanged from previous.  UA with small leukocytes and rare bacteria, not overly suggestive of UTI, sent for culture.  No evidence of sepsis.  I spoke with her son Heidi Blevins who states that he would like for her to still be treated for UTI given that this is a typical presentation for her.  I think this is reasonable.  Reviewed culture reports from prior, culture from 6 months ago showed Aerococcus urinary which could not be tested for susceptibility.  Other cultures grew E. coli.  Will send home with Keflex.  Return precautions discussed with the son.  He voices understanding and is agreeable.  Patient was also seen and evaluated by Dr. Karle Starch.  Return precautions discussed with son. Stable for discharge back to facility at this time.  Final Clinical Impression(s) / ED Diagnoses Final diagnoses:  Acute cystitis without hematuria  Weakness    Rx / DC  Orders ED Discharge Orders    None          Truddie Hidden, MD 06/20/20 2324    Garald Balding, PA-C 06/20/20 2330    Truddie Hidden, MD 06/20/20 [phone]

## 2020-06-22 LAB — URINE CULTURE: Culture: 50000 — AB

## 2020-06-23 ENCOUNTER — Telehealth: Payer: Self-pay | Admitting: Emergency Medicine

## 2020-06-23 NOTE — Telephone Encounter (Signed)
Post ED Visit - Positive Culture Follow-up  Culture report reviewed by antimicrobial stewardship pharmacist: [facility] Team []  Elenor Quinones, Pharm.D. []  Heide Guile, Pharm.D., BCPS AQ-ID []  Parks Neptune, Pharm.D., BCPS []  Alycia Rossetti, Pharm.D., BCPS []  [place], [place].D., BCPS, AAHIVP []  Legrand Como, Pharm.D., BCPS, AAHIVP []  Salome Arnt, PharmD, BCPS []  Johnnette Gourd, PharmD, BCPS []  Hughes Better, PharmD, BCPS []  Leeroy Cha, PharmD []  Laqueta Linden, PharmD, BCPS []  Albertina Parr, PharmD  [facility] Team []  Leodis Sias, PharmD []  Lindell Spar, PharmD []  Royetta Asal, PharmD []  Graylin Shiver, Rph []  Rema Fendt) Glennon Mac, PharmD []  Arlyn Dunning, PharmD []  Netta Cedars, PharmD []  Dia Sitter, PharmD []  Leone Haven, PharmD []  Gretta Arab, PharmD []  Theodis Shove, PharmD []  Peggyann Juba, PharmD []  Reuel Boom, PharmD [facility] PharmD   Positive urine culture Treated with cephalexin, organism sensitive to the same and no further patient follow-up is required at this time.  Hazle Nordmann 06/23/2020, 9:56 AM

## 2020-06-26 LAB — CULTURE, BLOOD (ROUTINE X 2): Culture: NO GROWTH

## 2020-08-08 ENCOUNTER — Emergency Department (HOSPITAL_COMMUNITY): Payer: Medicare Other

## 2020-08-08 ENCOUNTER — Inpatient Hospital Stay (HOSPITAL_COMMUNITY)
Admission: EM | Admit: 2020-08-08 | Discharge: 2020-08-13 | DRG: 064 | Disposition: A | Discharge status: Home Health | Payer: Medicare Other | Source: Skilled Nursing Facility | Attending: Internal Medicine | Admitting: Internal Medicine

## 2020-08-08 DIAGNOSIS — Z96642 Presence of left artificial hip joint: Secondary | ICD-10-CM | POA: Diagnosis present

## 2020-08-08 DIAGNOSIS — Z823 Family history of stroke: Secondary | ICD-10-CM

## 2020-08-08 DIAGNOSIS — R0781 Pleurodynia: Secondary | ICD-10-CM | POA: Diagnosis not present

## 2020-08-08 DIAGNOSIS — Z20822 Contact with and (suspected) exposure to covid-19: Secondary | ICD-10-CM | POA: Diagnosis present

## 2020-08-08 DIAGNOSIS — Z87440 Personal history of urinary (tract) infections: Secondary | ICD-10-CM

## 2020-08-08 DIAGNOSIS — Y92129 Unspecified place in nursing home as the place of occurrence of the external cause: Secondary | ICD-10-CM

## 2020-08-08 DIAGNOSIS — N390 Urinary tract infection, site not specified: Secondary | ICD-10-CM | POA: Diagnosis present

## 2020-08-08 DIAGNOSIS — I313 Pericardial effusion (noninflammatory): Secondary | ICD-10-CM | POA: Diagnosis present

## 2020-08-08 DIAGNOSIS — R471 Dysarthria and anarthria: Secondary | ICD-10-CM | POA: Diagnosis present

## 2020-08-08 DIAGNOSIS — E785 Hyperlipidemia, unspecified: Secondary | ICD-10-CM | POA: Diagnosis present

## 2020-08-08 DIAGNOSIS — I6389 Other cerebral infarction: Principal | ICD-10-CM | POA: Diagnosis present

## 2020-08-08 DIAGNOSIS — G9340 Encephalopathy, unspecified: Secondary | ICD-10-CM | POA: Diagnosis present

## 2020-08-08 DIAGNOSIS — Z4659 Encounter for fitting and adjustment of other gastrointestinal appliance and device: Secondary | ICD-10-CM

## 2020-08-08 DIAGNOSIS — F17210 Nicotine dependence, cigarettes, uncomplicated: Secondary | ICD-10-CM | POA: Diagnosis present

## 2020-08-08 DIAGNOSIS — Z8673 Personal history of transient ischemic attack (TIA), and cerebral infarction without residual deficits: Secondary | ICD-10-CM | POA: Diagnosis present

## 2020-08-08 DIAGNOSIS — Z882 Allergy status to sulfonamides status: Secondary | ICD-10-CM | POA: Diagnosis not present

## 2020-08-08 DIAGNOSIS — I6930 Unspecified sequelae of cerebral infarction: Secondary | ICD-10-CM | POA: Diagnosis not present

## 2020-08-08 DIAGNOSIS — R1314 Dysphagia, pharyngoesophageal phase: Secondary | ICD-10-CM | POA: Diagnosis present

## 2020-08-08 DIAGNOSIS — R140 Abdominal distension (gaseous): Secondary | ICD-10-CM

## 2020-08-08 DIAGNOSIS — E041 Nontoxic single thyroid nodule: Secondary | ICD-10-CM

## 2020-08-08 DIAGNOSIS — Z832 Family history of diseases of the blood and blood-forming organs and certain disorders involving the immune mechanism: Secondary | ICD-10-CM

## 2020-08-08 DIAGNOSIS — G9341 Metabolic encephalopathy: Secondary | ICD-10-CM | POA: Diagnosis present

## 2020-08-08 DIAGNOSIS — I161 Hypertensive emergency: Secondary | ICD-10-CM | POA: Diagnosis present

## 2020-08-08 DIAGNOSIS — Z7982 Long term (current) use of aspirin: Secondary | ICD-10-CM | POA: Diagnosis not present

## 2020-08-08 DIAGNOSIS — Z8249 Family history of ischemic heart disease and other diseases of the circulatory system: Secondary | ICD-10-CM

## 2020-08-08 DIAGNOSIS — F259 Schizoaffective disorder, unspecified: Secondary | ICD-10-CM | POA: Diagnosis present

## 2020-08-08 DIAGNOSIS — R4182 Altered mental status, unspecified: Secondary | ICD-10-CM | POA: Diagnosis not present

## 2020-08-08 DIAGNOSIS — Z79890 Hormone replacement therapy (postmenopausal): Secondary | ICD-10-CM

## 2020-08-08 DIAGNOSIS — E860 Dehydration: Secondary | ICD-10-CM | POA: Diagnosis present

## 2020-08-08 DIAGNOSIS — I639 Cerebral infarction, unspecified: Secondary | ICD-10-CM

## 2020-08-08 DIAGNOSIS — T50915A Adverse effect of multiple unspecified drugs, medicaments and biological substances, initial encounter: Secondary | ICD-10-CM | POA: Diagnosis present

## 2020-08-08 DIAGNOSIS — R29722 NIHSS score 22: Secondary | ICD-10-CM | POA: Diagnosis present

## 2020-08-08 DIAGNOSIS — I110 Hypertensive heart disease with heart failure: Secondary | ICD-10-CM | POA: Diagnosis present

## 2020-08-08 DIAGNOSIS — R8271 Bacteriuria: Secondary | ICD-10-CM | POA: Diagnosis present

## 2020-08-08 DIAGNOSIS — R1310 Dysphagia, unspecified: Secondary | ICD-10-CM | POA: Diagnosis present

## 2020-08-08 DIAGNOSIS — I5032 Chronic diastolic (congestive) heart failure: Secondary | ICD-10-CM | POA: Diagnosis present

## 2020-08-08 DIAGNOSIS — I6381 Other cerebral infarction due to occlusion or stenosis of small artery: Secondary | ICD-10-CM | POA: Diagnosis not present

## 2020-08-08 DIAGNOSIS — R4701 Aphasia: Secondary | ICD-10-CM | POA: Diagnosis present

## 2020-08-08 DIAGNOSIS — I10 Essential (primary) hypertension: Secondary | ICD-10-CM | POA: Diagnosis not present

## 2020-08-08 DIAGNOSIS — I452 Bifascicular block: Secondary | ICD-10-CM | POA: Diagnosis present

## 2020-08-08 DIAGNOSIS — I63311 Cerebral infarction due to thrombosis of right middle cerebral artery: Secondary | ICD-10-CM | POA: Diagnosis not present

## 2020-08-08 DIAGNOSIS — R29810 Facial weakness: Secondary | ICD-10-CM | POA: Diagnosis present

## 2020-08-08 DIAGNOSIS — Z79899 Other long term (current) drug therapy: Secondary | ICD-10-CM | POA: Diagnosis not present

## 2020-08-08 DIAGNOSIS — R079 Chest pain, unspecified: Secondary | ICD-10-CM

## 2020-08-08 LAB — CBC
HCT: 41.7 % (ref 36.0–46.0)
Hemoglobin: 12.7 g/dL (ref 12.0–15.0)
MCH: 31.1 pg (ref 26.0–34.0)
MCHC: 30.5 g/dL (ref 30.0–36.0)
MCV: 102.2 fL — ABNORMAL HIGH (ref 80.0–100.0)
Platelets: 225 10*3/uL (ref 150–400)
RBC: 4.08 MIL/uL (ref 3.87–5.11)
RDW: 12.5 % (ref 11.5–15.5)
WBC: 8.9 10*3/uL (ref 4.0–10.5)
nRBC: 0 % (ref 0.0–0.2)

## 2020-08-08 LAB — I-STAT CHEM 8, ED
BUN: 11 mg/dL (ref 8–23)
Calcium, Ion: 1.31 mmol/L (ref 1.15–1.40)
Chloride: 108 mmol/L (ref 98–111)
Creatinine, Ser: 0.8 mg/dL (ref 0.44–1.00)
Glucose, Bld: 134 mg/dL — ABNORMAL HIGH (ref 70–99)
HCT: 42 % (ref 36.0–46.0)
Hemoglobin: 14.3 g/dL (ref 12.0–15.0)
Potassium: 3.9 mmol/L (ref 3.5–5.1)
Sodium: 141 mmol/L (ref 135–145)
TCO2: 23 mmol/L (ref 22–32)

## 2020-08-08 LAB — COMPREHENSIVE METABOLIC PANEL
ALT: 14 U/L (ref 0–44)
AST: 16 U/L (ref 15–41)
Albumin: 3.8 g/dL (ref 3.5–5.0)
Alkaline Phosphatase: 95 U/L (ref 38–126)
Anion gap: 8 (ref 5–15)
BUN: 8 mg/dL (ref 8–23)
CO2: 22 mmol/L (ref 22–32)
Calcium: 10.5 mg/dL — ABNORMAL HIGH (ref 8.9–10.3)
Chloride: 107 mmol/L (ref 98–111)
Creatinine, Ser: 0.95 mg/dL (ref 0.44–1.00)
GFR, Estimated: >60 mL/min (ref >60)
Glucose, Bld: 162 mg/dL — ABNORMAL HIGH (ref 70–99)
Potassium: 3.9 mmol/L (ref 3.5–5.1)
Sodium: 137 mmol/L (ref 135–145)
Total Bilirubin: 0.5 mg/dL (ref 0.3–1.2)
Total Protein: 7.2 g/dL (ref 6.5–8.1)

## 2020-08-08 LAB — CBC WITH DIFFERENTIAL/PLATELET
Abs Immature Granulocytes: 0.03 10*3/uL (ref 0.00–0.07)
Basophils Absolute: 0 10*3/uL (ref 0.0–0.1)
Basophils Relative: 0 %
Eosinophils Absolute: 0.1 10*3/uL (ref 0.0–0.5)
Eosinophils Relative: 1 %
HCT: 41.7 % (ref 36.0–46.0)
Hemoglobin: 12.7 g/dL (ref 12.0–15.0)
Immature Granulocytes: 0 %
Lymphocytes Relative: 18 %
Lymphs Abs: 1.6 10*3/uL (ref 0.7–4.0)
MCH: 31.2 pg (ref 26.0–34.0)
MCHC: 30.5 g/dL (ref 30.0–36.0)
MCV: 102.5 fL — ABNORMAL HIGH (ref 80.0–100.0)
Monocytes Absolute: 0.3 10*3/uL (ref 0.1–1.0)
Monocytes Relative: 3 %
Neutro Abs: 6.8 10*3/uL (ref 1.7–7.7)
Neutrophils Relative %: 78 %
Platelets: 224 10*3/uL (ref 150–400)
RBC: 4.07 MIL/uL (ref 3.87–5.11)
RDW: 12.5 % (ref 11.5–15.5)
WBC: 8.7 10*3/uL (ref 4.0–10.5)
nRBC: 0 % (ref 0.0–0.2)

## 2020-08-08 LAB — CBG MONITORING, ED: Glucose-Capillary: 139 mg/dL — ABNORMAL HIGH (ref 70–99)

## 2020-08-08 LAB — APTT: aPTT: 25 seconds (ref 24–36)

## 2020-08-08 LAB — PROTIME-INR
INR: 1.1 (ref 0.8–1.2)
Prothrombin Time: 13.7 seconds (ref 11.4–15.2)

## 2020-08-08 MED ORDER — IOHEXOL 350 MG/ML SOLN
75.0000 mL | Freq: Once | INTRAVENOUS | Status: AC | PRN
  Administered 2020-08-08: 75 mL via INTRAVENOUS

## 2020-08-08 MED ORDER — ASPIRIN EC 81 MG PO TBEC: 81.0000 mg | DELAYED_RELEASE_TABLET | Freq: Every day | ORAL | Status: DC

## 2020-08-08 MED ORDER — FE FUMARATE-B12-VIT C-FA-IFC PO CAPS
1.0000 | ORAL_CAPSULE | Freq: Two times a day (BID) | ORAL | Status: DC
  Filled 2020-08-08: qty 1

## 2020-08-08 MED ORDER — ACETAMINOPHEN 325 MG PO TABS
650.0000 mg | ORAL_TABLET | ORAL | Status: DC | PRN
  Administered 2020-08-10 – 2020-08-12 (×4): 650 mg via ORAL
  Filled 2020-08-08 (×4): qty 2

## 2020-08-08 MED ORDER — PROPYLENE GLYCOL 0.6 % OP SOLN: 1.0000 [drp] | Freq: Three times a day (TID) | OPHTHALMIC | Status: DC

## 2020-08-08 MED ORDER — PALIPERIDONE PALMITATE ER 156 MG/ML IM SUSY: 156.0000 mg | PREFILLED_SYRINGE | INTRAMUSCULAR | Status: DC

## 2020-08-08 MED ORDER — SODIUM CHLORIDE 0.9% FLUSH
3.0000 mL | Freq: Once | INTRAVENOUS | Status: AC
  Administered 2020-08-08: 23:00:00 3 mL via INTRAVENOUS

## 2020-08-08 MED ORDER — LORAZEPAM 0.5 MG PO TABS: 0.5000 mg | ORAL_TABLET | Freq: Four times a day (QID) | ORAL | Status: DC

## 2020-08-08 MED ORDER — HALOPERIDOL LACTATE 5 MG/ML IJ SOLN
2.0000 mg | Freq: Four times a day (QID) | INTRAMUSCULAR | Status: DC | PRN
  Administered 2020-08-10: 06:00:00 2 mg via INTRAVENOUS
  Filled 2020-08-08: qty 1

## 2020-08-08 MED ORDER — SODIUM CHLORIDE 0.45 % IV SOLN: INTRAVENOUS | Status: DC

## 2020-08-08 MED ORDER — ACETAMINOPHEN 650 MG RE SUPP: 650.0000 mg | RECTAL | Status: DC | PRN

## 2020-08-08 MED ORDER — BENZTROPINE MESYLATE 1 MG PO TABS: 1.0000 mg | ORAL_TABLET | Freq: Two times a day (BID) | ORAL | Status: DC

## 2020-08-08 MED ORDER — LITHIUM CARBONATE 300 MG PO CAPS: 300.0000 mg | ORAL_CAPSULE | Freq: Every morning | ORAL | Status: DC

## 2020-08-08 MED ORDER — SENNOSIDES-DOCUSATE SODIUM 8.6-50 MG PO TABS
1.0000 | ORAL_TABLET | Freq: Every evening | ORAL | Status: DC | PRN
Start: 2020-08-08 — End: 2020-08-09

## 2020-08-08 MED ORDER — MELATONIN 5 MG PO TABS
5.0000 mg | ORAL_TABLET | Freq: Every day | ORAL | Status: DC
  Filled 2020-08-08: qty 1

## 2020-08-08 MED ORDER — PRAMOXINE HCL 1 % EX MISC: 1.0000 "application " | Freq: Every day | CUTANEOUS | Status: DC | PRN

## 2020-08-08 MED ORDER — LISINOPRIL 20 MG PO TABS: 40.0000 mg | ORAL_TABLET | Freq: Every day | ORAL | Status: DC

## 2020-08-08 MED ORDER — ACETAMINOPHEN 160 MG/5ML PO SOLN
650.0000 mg | ORAL | Status: DC | PRN
  Administered 2020-08-11 – 2020-08-12 (×3): 650 mg
  Filled 2020-08-08 (×4): qty 20.3

## 2020-08-08 MED ORDER — ENOXAPARIN SODIUM 40 MG/0.4ML ~~LOC~~ SOLN
40.0000 mg | Freq: Every day | SUBCUTANEOUS | Status: DC
  Administered 2020-08-09 – 2020-08-12 (×5): 40 mg via SUBCUTANEOUS
  Filled 2020-08-08 (×5): qty 0.4

## 2020-08-08 MED ORDER — SERTRALINE HCL 100 MG PO TABS: 100.0000 mg | ORAL_TABLET | Freq: Every day | ORAL | Status: DC

## 2020-08-08 MED ORDER — FLUPHENAZINE HCL 10 MG PO TABS
20.0000 mg | ORAL_TABLET | Freq: Every day | ORAL | Status: DC
  Filled 2020-08-08: qty 2

## 2020-08-08 MED ORDER — POLYETHYLENE GLYCOL 3350 17 G PO PACK: 17.0000 g | PACK | Freq: Every day | ORAL | Status: DC

## 2020-08-08 MED ORDER — STROKE: EARLY STAGES OF RECOVERY BOOK: Freq: Once | Status: DC

## 2020-08-08 MED ORDER — BACLOFEN 10 MG PO TABS: 20.0000 mg | ORAL_TABLET | Freq: Three times a day (TID) | ORAL | Status: DC | PRN

## 2020-08-08 MED ORDER — LURASIDONE HCL 40 MG PO TABS
120.0000 mg | ORAL_TABLET | Freq: Every day | ORAL | Status: DC
  Filled 2020-08-08: qty 2

## 2020-08-08 MED ORDER — LITHIUM CARBONATE 150 MG PO CAPS
150.0000 mg | ORAL_CAPSULE | Freq: Every day | ORAL | Status: DC
  Filled 2020-08-08: qty 1

## 2020-08-08 NOTE — ED Notes (Signed)
Pts son reports pt has a history of utis.

## 2020-08-08 NOTE — ED Provider Notes (Signed)
[place] EMERGENCY DEPARTMENT Provider Note   CSN: [id number] Arrival date & time: 08/08/20  2139  An emergency department physician performed an initial assessment on this suspected stroke patient at 2141.  History Chief Complaint  Patient presents with  . Code Stroke    Heidi Blevins is a 69 y.o. female history of AKI, UTI, schizoaffective disease here presenting altered mental status.  Patient is from [facility]. Her last normal was around 4:30 pm as per nursing staff. She was noted to be altered, confused, and has L facial droop so EMS was called. EMS called code stroke and she vomited in the field. Was noted to be hypertensive 220/100 per EMS.    The history is provided by the nursing home.       Past Medical History:  Diagnosis Date  . Anxiety   . Arrhythmia   . Bifascicular block   . Depression   . Hypertension   . Insomnia   . Schizoaffective disorder   . Scoliosis   . Vitamin D deficiency     Patient Active Problem List   Diagnosis Date Noted  . Bradycardia   . Acute metabolic encephalopathy   . Sepsis secondary to UTI ([place]) 03/20/2019  . Closed displaced fracture of left femoral neck ([place]) 09/26/2018  . AKI (acute kidney injury) ([place]) 06/13/2016  . UTI (urinary tract infection) 06/13/2016  . Essential hypertension 06/13/2016  . Urinary tract infection without hematuria   . Complicated UTI (urinary tract infection) 05/03/2016  . Hypercalcemia 05/03/2016  . Dehydration 05/03/2016  . Acute encephalopathy 05/03/2016  . 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 Schizoaffective disorder ([place]) 05/03/2016  . Bifascicular block 05/03/2016    Past Surgical History:  Procedure Laterality Date  . BACK SURGERY     "as a child after she fell out of a tree"  . TONSILLECTOMY    . TOTAL HIP ARTHROPLASTY Left 09/27/2018   Procedure: LEFT TOTAL HIP ARTHROPLASTY ANTERIOR APPROACH;  Surgeon: Leandrew Koyanagi, MD;  Location: [facility];   Service: Orthopedics;  Laterality: Left;     OB History   No obstetric history on file.     Family History  Problem Relation Age of Onset  . CVA Mother   . Hypertension Mother   . Lupus Sister   . Hypertension Brother   . Peptic Ulcer Disease Father   . Colon cancer Neg Hx     Social History   Tobacco Use  . Smoking status: Current Every Day Smoker    Packs/day: 0.25    Years: 40.00    Pack years: 10.00    Types: Cigarettes  . Smokeless tobacco: Never Used  Substance Use Topics  . Alcohol use: No  . Drug use: No    Home Medications Prior to Admission medications   Medication Sig Start Date End Date Taking? Authorizing Provider  acetaminophen (TYLENOL) 325 MG tablet Take 650 mg by mouth every 4 (four) hours as needed for mild pain, moderate pain, fever or headache.    [provider]  acetaminophen (TYLENOL) 500 MG tablet Take 500 mg by mouth daily.     [provider]  aspirin EC 81 MG EC tablet Take 1 tablet (81 mg total) by mouth daily. 05/06/16   Doreatha Lew, MD  baclofen (LIORESAL) 20 MG tablet Take 20 mg by mouth 3 (three) times daily as needed for muscle spasms.     [provider]  benztropine (COGENTIN) 1 MG tablet  Take 1 mg by mouth 2 (two) times daily.    [provider]  Cranberry 425 MG CAPS Take 425 mg by mouth 2 (two) times a day.    [provider]  ferrous [id number] C-folic acid (FEROCON) capsule Take 1 capsule by mouth at bedtime.    [provider]  fluPHENAZine (PROLIXIN) 10 MG tablet Take 20 mg by mouth daily.     [provider]  lisinopril (ZESTRIL) 10 MG tablet Take 1 tablet (10 mg total) by mouth daily. Hold if Systolic b/p <502 Patient not taking: Reported on 06/20/2020 03/25/19   Eugenie Filler, MD  lisinopril (ZESTRIL) 20 MG tablet Take 20 mg by mouth daily.    [provider]  lithium carbonate 150 MG capsule Take 150-300 mg by mouth See admin  instructions. Takes 2 capsules (300 mg) by mouth in the morning and 1 capsule (150 mg) at bedtime. 09/11/18   [provider]  loratadine (CLARITIN) 10 MG tablet Take 10 mg by mouth daily.    [provider]  LORazepam (ATIVAN) 0.5 MG tablet Take 1 tablet (0.5 mg total) by mouth 4 (four) times daily. 10/03/18   Desiree Hane, MD  Lurasidone HCl (LATUDA) 120 MG TABS Take 120 mg by mouth daily.    [provider]  Melatonin 5 MG TABS Take 5 mg by mouth at bedtime.    [provider]  paliperidone (INVEGA SUSTENNA) 156 MG/ML SUSY injection Inject 156 mg into the muscle every 30 (thirty) days.    [provider]  polyethylene glycol powder (GAVILAX) 17 GM/SCOOP powder Take 17 g by mouth daily. Mix in 8 oz of fluid and drink by mouth.    [provider]  Pramoxine HCl (VAGISIL ANTI-ITCH MEDICATED) 1 % MISC Apply vaginally once a day Apply 1 application topically daily as needed (for vaginal itching). Apply vaginally once a day Patient taking differently: No sig reported 03/25/19   Eugenie Filler, MD  Propylene Glycol (SYSTANE BALANCE) 0.6 % SOLN Place 1 drop into both eyes 3 (three) times daily.     [provider]  sertraline (ZOLOFT) 100 MG tablet Take 100 mg by mouth daily.     [provider]    Allergies    Sulfonamide derivatives  Review of Systems   Review of Systems  Neurological: Positive for speech difficulty.  Psychiatric/Behavioral: Positive for confusion.  All other systems reviewed and are negative.   Physical Exam Updated Vital Signs BP (!) 181/89 (BP Location: Left Arm)   Pulse 78   Temp 98.5 F (36.9 C) (Oral)   Resp 19   Ht 5\' 5"  (1.651 m)   Wt 67 kg   SpO2 95%   BMI 24.58 kg/m   Physical Exam Vitals and nursing note reviewed.  Constitutional:      Comments: Altered, snoring   HENT:     Head: Normocephalic.     Mouth/Throat:     Mouth: Mucous membranes are dry.  Eyes:     Extraocular  Movements: Extraocular movements intact.     Pupils: Pupils are equal, round, and reactive to light.  Cardiovascular:     Rate and Rhythm: Normal rate and regular rhythm.     Pulses: Normal pulses.  Pulmonary:     Comments: Diminished bilateral bases  Abdominal:     General: Abdomen is flat.     Palpations: Abdomen is soft.  Musculoskeletal:        General: Normal range  of motion.     Cervical back: Normal range of motion.  Skin:    General: Skin is warm.     Capillary Refill: Capillary refill takes less than 2 seconds.  Neurological:     Comments: L facial droop, confused, moving all extremities but not following commands      ED Results / Procedures / Treatments   Labs (all labs ordered are listed, but only abnormal results are displayed) Labs Reviewed  CBC - Abnormal; Notable for the following components:      Result Value   MCV 102.2 (*)    All other components within normal limits  I-STAT CHEM 8, ED - Abnormal; Notable for the following components:   Glucose, Bld 134 (*)    All other components within normal limits  CBG MONITORING, ED - Abnormal; Notable for the following components:   Glucose-Capillary 139 (*)    All other components within normal limits  RESP PANEL BY RT-PCR (FLU A&B, COVID) [id number]  PROTIME-INR  APTT  COMPREHENSIVE METABOLIC PANEL  CBC WITH DIFFERENTIAL/PLATELET    EKG EKG Interpretation  Date/Time:  Sunday August 08 2020 22:37:55 EST Ventricular Rate:  77 PR Interval:    QRS Duration: 156 QT Interval:  433 QTC Calculation: 491 R Axis:   -82 Text Interpretation: Sinus rhythm Prolonged PR interval RBBB and LAFB No significant change since last tracing Confirmed by Wandra Arthurs [id number]) on 08/08/2020 10:41:15 PM   Radiology CT HEAD CODE STROKE WO CONTRAST  Result Date: 08/08/2020 CLINICAL DATA:  Code stroke. Initial evaluation for neuro deficit, stroke suspected. EXAM: CT HEAD WITHOUT CONTRAST TECHNIQUE: Contiguous axial images were  obtained from the base of the skull through the vertex without intravenous contrast. COMPARISON:  Prior head CT from 06/20/2020 FINDINGS: Brain: Generalized age-related cerebral atrophy with moderate chronic microvascular ischemic disease, stable from previous. Remote lacunar infarct present at the right lentiform nucleus. No acute intracranial hemorrhage. No acute large vessel territory infarct. No mass lesion, midline shift or mass effect. No hydrocephalus or extra-axial fluid collection. Vascular: No asymmetric hyperdense vessel. Scattered vascular calcifications noted within the carotid siphons. Skull: Scalp soft tissues and calvarium within normal limits. Sinuses/Orbits: Globes orbital soft tissues demonstrate no acute finding. Mild mucosal thickening noted within the ethmoidal air cells. Paranasal sinuses are otherwise clear. No mastoid effusion. Other: None. ASPECTS [facility] Stroke Program Early CT Score) - Ganglionic level infarction (caudate, lentiform nuclei, internal capsule, insula, M1-M3 cortex): 7 - Supraganglionic infarction (M4-M6 cortex): 3 Total score (0-10 with 10 being normal): 10 IMPRESSION: 1. No acute intracranial infarct or other abnormality. 2. ASPECTS is 10. 3. Age-related cerebral atrophy with chronic microvascular ischemic disease. Small remote lacunar infarct at the right lentiform nucleus. These results were communicated to Dr. Theda Sers at 10:06 pmon 12/12/2021by text page via the [facility] messaging system. Electronically Signed   By: Jeannine Boga M.D.   On: 08/08/2020 22:12   CT ANGIO HEAD CODE STROKE  Result Date: 08/08/2020 CLINICAL DATA:  Follow-up examination for acute stroke. Unresponsive. Six EXAM: CT ANGIOGRAPHY HEAD AND NECK TECHNIQUE: Multidetector CT imaging of the head and neck was performed using the standard protocol during bolus administration of intravenous contrast. Multiplanar CT image reconstructions and MIPs were obtained to evaluate the vascular anatomy.  Carotid stenosis measurements (when applicable) are obtained utilizing NASCET criteria, using the distal internal carotid diameter as the denominator. CONTRAST:  64mL OMNIPAQUE IOHEXOL 350 MG/ML SOLN COMPARISON:  Prior head CT from earlier the same day. FINDINGS: CTA NECK  FINDINGS Aortic arch: Sized visualized aortic arch of normal caliber with normal 3 vessel morphology. Mild atheromatous change within the undersurface of the arch itself. No hemodynamically significant stenosis about the origin of the great vessels. Right carotid system: Right common and internal carotid arteries are mildly tortuous but are widely patent without stenosis, dissection or occlusion. Left carotid system: Left common and internal carotid arteries somewhat diffusely tortuous but are widely patent without stenosis, dissection or occlusion. Vertebral arteries: Both vertebral arteries arise from the subclavian arteries. Atheromatous plaque at the origin of the left vertebral artery with associated mild ostial stenosis. No proximal subclavian artery stenosis. Vertebral arteries otherwise tortuous but widely patent within the neck without stenosis, dissection or occlusion. Skeleton: No visible acute osseous abnormality. Scoliosis with Harrington rod fixation partially visualized. No discrete or worrisome osseous lesions. Patient is edentulous. Other neck: No other acute soft tissue abnormality within the neck. 3.9 cm right thyroid nodule (series 5, image 132). No other mass or adenopathy. Upper chest: Visualized upper chest demonstrates no acute finding. Review of the MIP images confirms the above findings CTA HEAD FINDINGS Anterior circulation: Petrous segments widely patent bilaterally. Scattered atheromatous change within the carotid siphons bilaterally. No more than mild stenosis on the right. More mild to moderate stenosis noted about the cavernous left ICA. Right A1 widely patent. Left A1 hypoplastic, accounting for the diminutive left  ICA is compared to the right. Normal anterior communicating artery complex. ACAs somewhat tortuous but are widely patent without stenosis. No M1 stenosis or occlusion. Negative MCA bifurcations. Distal MCA branches well perfused and symmetric. Posterior circulation: Both V4 segments widely patent to the vertebrobasilar junction without stenosis. Origin of the right PICA patent and normal. Left PICA origin not well seen. Basilar somewhat diffusely irregular but is widely patent to its distal aspect without significant stenosis. Superior cerebral arteries patent bilaterally. Both PCAs primarily supplied via the basilar well perfused to their distal aspects. Venous sinuses: Grossly patent allowing for timing the contrast bolus. Anatomic variants: Hypoplastic left A1 segment with the ACAs primarily supplied via the right carotid artery system. No aneurysm. Review of the MIP images confirms the above findings IMPRESSION: 1. Negative CTA for emergent large vessel occlusion. 2. Mild for age atheromatous change about the carotid siphons with associated mild to moderate multifocal narrowing, left worse than right. No other hemodynamically significant or correctable stenosis about the major arterial vasculature of the head and neck. 3. Diffuse tortuosity about the major arterial vasculature of the head and neck, suggesting chronic underlying hypertension. 4. 3.9 cm right thyroid nodule, indeterminate. Further evaluation with dedicated thyroid ultrasound recommended for further evaluation. This could be performed on a nonemergent outpatient basis. (ref: J Am Coll Radiol. 2015 Feb;12(2): 143-50). These results were communicated to Dr. Theda Sers at 10:31 pmon 12/12/2021by text page via the [facility] messaging system. Electronically Signed   By: Jeannine Boga M.D.   On: 08/08/2020 22:41   CT ANGIO NECK CODE STROKE  Result Date: 08/08/2020 CLINICAL DATA:  Follow-up examination for acute stroke. Unresponsive. Six EXAM: CT  ANGIOGRAPHY HEAD AND NECK TECHNIQUE: Multidetector CT imaging of the head and neck was performed using the standard protocol during bolus administration of intravenous contrast. Multiplanar CT image reconstructions and MIPs were obtained to evaluate the vascular anatomy. Carotid stenosis measurements (when applicable) are obtained utilizing NASCET criteria, using the distal internal carotid diameter as the denominator. CONTRAST:  60mL OMNIPAQUE IOHEXOL 350 MG/ML SOLN COMPARISON:  Prior head CT from earlier the same day.  FINDINGS: CTA NECK FINDINGS Aortic arch: Sized visualized aortic arch of normal caliber with normal 3 vessel morphology. Mild atheromatous change within the undersurface of the arch itself. No hemodynamically significant stenosis about the origin of the great vessels. Right carotid system: Right common and internal carotid arteries are mildly tortuous but are widely patent without stenosis, dissection or occlusion. Left carotid system: Left common and internal carotid arteries somewhat diffusely tortuous but are widely patent without stenosis, dissection or occlusion. Vertebral arteries: Both vertebral arteries arise from the subclavian arteries. Atheromatous plaque at the origin of the left vertebral artery with associated mild ostial stenosis. No proximal subclavian artery stenosis. Vertebral arteries otherwise tortuous but widely patent within the neck without stenosis, dissection or occlusion. Skeleton: No visible acute osseous abnormality. Scoliosis with Harrington rod fixation partially visualized. No discrete or worrisome osseous lesions. Patient is edentulous. Other neck: No other acute soft tissue abnormality within the neck. 3.9 cm right thyroid nodule (series 5, image 132). No other mass or adenopathy. Upper chest: Visualized upper chest demonstrates no acute finding. Review of the MIP images confirms the above findings CTA HEAD FINDINGS Anterior circulation: Petrous segments widely patent  bilaterally. Scattered atheromatous change within the carotid siphons bilaterally. No more than mild stenosis on the right. More mild to moderate stenosis noted about the cavernous left ICA. Right A1 widely patent. Left A1 hypoplastic, accounting for the diminutive left ICA is compared to the right. Normal anterior communicating artery complex. ACAs somewhat tortuous but are widely patent without stenosis. No M1 stenosis or occlusion. Negative MCA bifurcations. Distal MCA branches well perfused and symmetric. Posterior circulation: Both V4 segments widely patent to the vertebrobasilar junction without stenosis. Origin of the right PICA patent and normal. Left PICA origin not well seen. Basilar somewhat diffusely irregular but is widely patent to its distal aspect without significant stenosis. Superior cerebral arteries patent bilaterally. Both PCAs primarily supplied via the basilar well perfused to their distal aspects. Venous sinuses: Grossly patent allowing for timing the contrast bolus. Anatomic variants: Hypoplastic left A1 segment with the ACAs primarily supplied via the right carotid artery system. No aneurysm. Review of the MIP images confirms the above findings IMPRESSION: 1. Negative CTA for emergent large vessel occlusion. 2. Mild for age atheromatous change about the carotid siphons with associated mild to moderate multifocal narrowing, left worse than right. No other hemodynamically significant or correctable stenosis about the major arterial vasculature of the head and neck. 3. Diffuse tortuosity about the major arterial vasculature of the head and neck, suggesting chronic underlying hypertension. 4. 3.9 cm right thyroid nodule, indeterminate. Further evaluation with dedicated thyroid ultrasound recommended for further evaluation. This could be performed on a nonemergent outpatient basis. (ref: J Am Coll Radiol. 2015 Feb;12(2): 143-50). These results were communicated to Dr. Theda Sers at 10:31 pmon  12/12/2021by text page via the [facility] messaging system. Electronically Signed   By: Jeannine Boga M.D.   On: 08/08/2020 22:41    Procedures Procedures (including critical care time)  Medications Ordered in ED Medications  sodium chloride flush (NS) 0.9 % injection 3 mL (3 mLs Intravenous Given 08/08/20 2231)  iohexol (OMNIPAQUE) 350 MG/ML injection 75 mL (75 mLs Intravenous Contrast Given 08/08/20 2214)    ED Course  I have reviewed the triage vital signs and the nursing notes.  Pertinent labs & imaging results that were available during my care of the patient were reviewed by me and considered in my medical decision making (see chart for details).  MDM Rules/Calculators/A&P                          VERNEAL WIERS is a 69 y.o. female here with L facial droop, confusion. BP was 220/100. Initial concerned for head bleed vs ischemic stroke. Dr. Theda Sers from neurology saw patient as part of code stroke. Will get labs, CT head/ CTA head/neck if there is no head bleed   10:57 PM CTA showed no LVO. BP down to 150s from 220s and neuro recommend permissive hypertension and stroke workup. Hospitalist to admit. MRI brain ordered   Final Clinical Impression(s) / ED Diagnoses Final diagnoses:  None    Rx / DC Orders ED Discharge Orders    None       Drenda Freeze, MD 08/08/20 2258

## 2020-08-08 NOTE — ED Triage Notes (Signed)
Pt arrived via ems as a code stroke from the [facility] facility. Last known well was 4:30 pm. Pt is normally alert and orientated x 4. Pt is not alert and orientated. EMS reports pt has vomited twice. Pt has a hx of hypertension,schizo. EMS reports bp 200/120, hr 80, blood sugar 190.

## 2020-08-08 NOTE — Consult Note (Addendum)
Neurology Consult H&P  CC: code stroke  History is obtained from: EMS and son  HPI: Heidi Blevins is a 69 y.o. female nursing home resident, AKI, UTI, schizoaffective disorder seen for altered mental status. She was last seen normal 1630 per nursing staff. She was noted to be altered, confused, with L facial droop and EMS was activated. EMS reported she vomited in the field and noted to be hypertensive 220/100.    I spoke with the son who says that she has presented with the same symptoms several times due to UTI. He also added that her normal SBP is in the 180s and she is a heavy smoker.   LKW: 1630 tpa given?: No, unclear onset IR Thrombectomy? No, LVO Modified Rankin Scale: 0-Completely asymptomatic and back to baseline post- stroke NIHSS: 22  ROS: Unable to assess due to encephalopathy.  Past Medical History:  Diagnosis Date  . Anxiety   . Arrhythmia   . Bifascicular block   . Depression   . Hypertension   . Insomnia   . Schizoaffective disorder   . Scoliosis   . Vitamin D deficiency      Family History  Problem Relation Age of Onset  . CVA Mother   . Hypertension Mother   . Lupus Sister   . Hypertension Brother   . Peptic Ulcer Disease Father   . Colon cancer Neg Hx     Social History:  reports that she has been smoking cigarettes. She has a 10.00 pack-year smoking history. She has never used smokeless tobacco. She reports that she does not drink alcohol and does not use drugs.   Prior to Admission medications   Medication Sig Start Date End Date Taking? Authorizing Provider  acetaminophen (TYLENOL) 325 MG tablet Take 650 mg by mouth every 4 (four) hours as needed for mild pain, moderate pain, fever or headache.    [provider]  acetaminophen (TYLENOL) 500 MG tablet Take 500 mg by mouth daily.     [provider]  aspirin EC 81 MG EC tablet Take 1 tablet (81 mg total) by mouth daily. 05/06/16   Doreatha Lew, MD  baclofen  (LIORESAL) 20 MG tablet Take 20 mg by mouth 3 (three) times daily as needed for muscle spasms.     [provider]  benztropine (COGENTIN) 1 MG tablet Take 1 mg by mouth 2 (two) times daily.    [provider]  Cranberry 425 MG CAPS Take 425 mg by mouth 2 (two) times a day.    [provider]  ferrous [id number] C-folic acid (FEROCON) capsule Take 1 capsule by mouth at bedtime.    [provider]  fluPHENAZine (PROLIXIN) 10 MG tablet Take 20 mg by mouth daily.     [provider]  lisinopril (ZESTRIL) 10 MG tablet Take 1 tablet (10 mg total) by mouth daily. Hold if Systolic b/p <530 Patient not taking: Reported on 06/20/2020 03/25/19   Eugenie Filler, MD  lisinopril (ZESTRIL) 20 MG tablet Take 20 mg by mouth daily.    [provider]  lithium carbonate 150 MG capsule Take 150-300 mg by mouth See admin instructions. Takes 2 capsules (300 mg) by mouth in the morning and 1 capsule (150 mg) at bedtime. 09/11/18   [provider]  loratadine (CLARITIN) 10 MG tablet Take 10 mg by mouth daily.    [provider]  LORazepam (ATIVAN) 0.5 MG tablet Take 1 tablet (0.5 mg total) by mouth 4 (  four) times daily. 10/03/18   Desiree Hane, MD  Lurasidone HCl (LATUDA) 120 MG TABS Take 120 mg by mouth daily.    [provider]  Melatonin 5 MG TABS Take 5 mg by mouth at bedtime.    [provider]  paliperidone (INVEGA SUSTENNA) 156 MG/ML SUSY injection Inject 156 mg into the muscle every 30 (thirty) days.    [provider]  polyethylene glycol powder (GAVILAX) 17 GM/SCOOP powder Take 17 g by mouth daily. Mix in 8 oz of fluid and drink by mouth.    [provider]  Pramoxine HCl (VAGISIL ANTI-ITCH MEDICATED) 1 % MISC Apply vaginally once a day Apply 1 application topically daily as needed (for vaginal itching). Apply vaginally once a day Patient taking differently: Apply 1 application topically daily  as needed (for vaginal itching).  03/25/19   Eugenie Filler, MD  Propylene Glycol (SYSTANE BALANCE) 0.6 % SOLN Place 1 drop into both eyes 3 (three) times daily.     [provider]  sertraline (ZOLOFT) 100 MG tablet Take 100 mg by mouth daily.     [provider]    Exam: Current vital signs: There were no vitals taken for this visit.  Physical Exam  Constitutional: Appears well-developed and well-nourished.  Psych: Unable to assess due to encephalopathy. Eyes: No scleral injection HENT: No OP obstrucion Head: Normocephalic.  Cardiovascular: Normal rate and regular rhythm.  Respiratory: Effort normal and breath sounds normal to anterior ascultation GI: Soft.  No distension. There is no tenderness.  Skin: WDI  Neuro: Mental Status: Patient is awake, nonverbal not able to participate in exam or to follow commands.  Cranial Nerves: Visual Fields are full. Pupils are ~24mm, equal, round, and reactive to light. EOMI without ptosis or diploplia.  Facial sensation is symmetric to touch Facial movement is symmetric - Edentulous.  Motor: Tone is normal. Bulk is normal. Patient does not move arms or legs. Sensory: Sensation is symmetric to touch in arms and legs. Deep Tendon Reflexes: 2+ and symmetric in the biceps and patellae. Plantars: Toes are downgoing bilaterally. Cerebellar: Unable to assess due to encephalopathy.  I have reviewed labs in epic and the pertinent results are:  Ref. Range 08/08/2020 21:41  Sodium Latest Ref Range: 135 - 145 mmol/L 137  Potassium Latest Ref Range: 3.5 - 5.1 mmol/L 3.9  Chloride Latest Ref Range: 98 - 111 mmol/L 107  CO2 Latest Ref Range: 22 - 32 mmol/L 22  Glucose Latest Ref Range: 70 - 99 mg/dL 162 (H)  BUN Latest Ref Range: 8 - 23 mg/dL 8  Creatinine Latest Ref Range: 0.44 - 1.00 mg/dL 0.95  Calcium Latest Ref Range: 8.9 - 10.3 mg/dL 10.5 (H)   I have reviewed the images obtained: NCT head showed no acute ischemic  changes, Small remote lacunar infarct at the right lentiform nucleus. ASPECTS 10. CTA head and neck showed Negative CTA for emergent large vessel occlusion. Mild for age atheromatous change about the carotid siphons with associated mild to moderate multifocal narrowing, left worse than right. No other hemodynamically significant or correctable stenosis about the major arterial vasculature of the head and neck. Diffuse tortuosity about the major arterial vasculature of the head and neck, suggesting chronic underlying hypertension. 3.9cm right thyroid nodule, indeterminate. Further evaluation with dedicated thyroid ultrasound recommended.   Assessment: Heidi Blevins is a 69 y.o. female PMHx nursing home resident, AKI, UTI, schizoaffective disorder, daily tobacco smoker, HTN, chronic lacunar stroke seen for altered  mental status. Her son stated normal SBP is in the 180s and she has presented similarly several times due to UTI and he did not think she had a clear face droop. Will need to rule out infectious/metabolic etiology versus vascular however, confounded by schizoaffective disorder. She will need better blood pressure control going forward.  The patient has chronically uncontrolled HTN, is a daily smoker, CT head showing chronic lacunar stroke and risk factors are not clear (last LDL on record 2009, last A1c 2017) and would benefit from further evaluation.  Plan: - MRI brain without contrast. - Recommend TTE. - Recommend labs: HbA1c, lipid panel, TSH. - Recommend Statin if LDL > 70 - Aspirin 81mg  daily. - SBP goal - Permissive hypertension first 24 h < 220/110.  - She will need better blood pressure control as outpatient. - Telemetry monitoring for arrhythmia. - Recommend bedside Swallow screen. - Recommend Stroke education. - Recommend PT/OT/SLP consult. - Recommend metabolic/infectious workup. - 3.9cm right thyroid nodule, indeterminate. Further evaluation with dedicated thyroid  ultrasound recommended.    This patient is critically ill and at significant risk of neurological worsening, death and care requires constant monitoring of vital signs, hemodynamics,respiratory and cardiac monitoring, neurological assessment, discussion with family, other specialists and medical decision making of high complexity. I spent 73 minutes of neurocritical care time  in the care of  this patient. This was time spent independent of any time provided by nurse practitioner or PA.  Electronically signed by: Dr. Lynnae Sandhoff Pager: [phone] 08/08/2020, 9:56 PM

## 2020-08-09 ENCOUNTER — Inpatient Hospital Stay (HOSPITAL_COMMUNITY): Payer: Medicare Other

## 2020-08-09 ENCOUNTER — Other Ambulatory Visit: Payer: Self-pay

## 2020-08-09 ENCOUNTER — Encounter (HOSPITAL_COMMUNITY): Payer: Self-pay | Admitting: Internal Medicine

## 2020-08-09 DIAGNOSIS — E041 Nontoxic single thyroid nodule: Secondary | ICD-10-CM

## 2020-08-09 DIAGNOSIS — E860 Dehydration: Secondary | ICD-10-CM

## 2020-08-09 DIAGNOSIS — I10 Essential (primary) hypertension: Secondary | ICD-10-CM

## 2020-08-09 DIAGNOSIS — I639 Cerebral infarction, unspecified: Secondary | ICD-10-CM

## 2020-08-09 DIAGNOSIS — Z79899 Other long term (current) drug therapy: Secondary | ICD-10-CM

## 2020-08-09 DIAGNOSIS — I6389 Other cerebral infarction: Secondary | ICD-10-CM

## 2020-08-09 DIAGNOSIS — R29810 Facial weakness: Secondary | ICD-10-CM

## 2020-08-09 HISTORY — DX: Nontoxic single thyroid nodule: E04.1

## 2020-08-09 LAB — LIPID PANEL
Cholesterol: 191 mg/dL (ref 0–200)
HDL: 60 mg/dL (ref >40)
LDL Cholesterol: 120 mg/dL — ABNORMAL HIGH (ref 0–99)
Total CHOL/HDL Ratio: 3.2 RATIO
Triglycerides: 55 mg/dL (ref <150)
VLDL: 11 mg/dL (ref 0–40)

## 2020-08-09 LAB — URINALYSIS, ROUTINE W REFLEX MICROSCOPIC
Bacteria, UA: NONE SEEN
Bilirubin Urine: NEGATIVE
Glucose, UA: NEGATIVE mg/dL
Hgb urine dipstick: NEGATIVE
Ketones, ur: 5 mg/dL — AB
Nitrite: NEGATIVE
Protein, ur: NEGATIVE mg/dL
Specific Gravity, Urine: 1.042 — ABNORMAL HIGH (ref 1.005–1.030)
pH: 7 (ref 5.0–8.0)

## 2020-08-09 LAB — ECHOCARDIOGRAM COMPLETE
Height: 65 in
S' Lateral: 3 cm
Weight: 2363.33 oz

## 2020-08-09 LAB — URINE CULTURE: Culture: NO GROWTH

## 2020-08-09 LAB — GLUCOSE, CAPILLARY
Glucose-Capillary: 152 mg/dL — ABNORMAL HIGH (ref 70–99)
Glucose-Capillary: 122 mg/dL — ABNORMAL HIGH (ref 70–99)
Glucose-Capillary: 145 mg/dL — ABNORMAL HIGH (ref 70–99)

## 2020-08-09 LAB — LITHIUM LEVEL: Lithium Lvl: 0.26 mmol/L — ABNORMAL LOW (ref 0.60–1.20)

## 2020-08-09 LAB — RESP PANEL BY RT-PCR (FLU A&B, COVID) ARPGX2
Influenza A by PCR: NEGATIVE
Influenza B by PCR: NEGATIVE
SARS Coronavirus 2 by RT PCR: NEGATIVE

## 2020-08-09 LAB — HIV ANTIBODY (ROUTINE TESTING W REFLEX): HIV Screen 4th Generation wRfx: NONREACTIVE

## 2020-08-09 LAB — HEMOGLOBIN A1C
Hgb A1c MFr Bld: 5.3 % (ref 4.8–5.6)
Mean Plasma Glucose: 105.41 mg/dL

## 2020-08-09 MED ORDER — FLUPHENAZINE HCL 5 MG PO TABS
20.0000 mg | ORAL_TABLET | Freq: Every day | ORAL | Status: DC
  Filled 2020-08-09: qty 2
  Filled 2020-08-09: qty 4

## 2020-08-09 MED ORDER — POLYVINYL ALCOHOL 1.4 % OP SOLN
1.0000 [drp] | Freq: Three times a day (TID) | OPHTHALMIC | Status: DC
  Administered 2020-08-09 – 2020-08-13 (×12): 1 [drp] via OPHTHALMIC
  Filled 2020-08-09 (×5): qty 15

## 2020-08-09 MED ORDER — LORAZEPAM 0.5 MG PO TABS
0.5000 mg | ORAL_TABLET | Freq: Two times a day (BID) | ORAL | Status: DC
  Filled 2020-08-09: qty 1

## 2020-08-09 MED ORDER — BENZTROPINE MESYLATE 1 MG PO TABS
1.0000 mg | ORAL_TABLET | Freq: Two times a day (BID) | ORAL | Status: DC
  Filled 2020-08-09: qty 1

## 2020-08-09 MED ORDER — OSMOLITE 1.2 CAL PO LIQD
1000.0000 mL | ORAL | Status: DC
  Administered 2020-08-09: 16:00:00 1000 mL

## 2020-08-09 MED ORDER — ATORVASTATIN CALCIUM 40 MG PO TABS
40.0000 mg | ORAL_TABLET | Freq: Every day | ORAL | Status: DC
  Administered 2020-08-09: 16:00:00 40 mg
  Filled 2020-08-09: qty 1

## 2020-08-09 MED ORDER — SODIUM CHLORIDE 0.9 % IV SOLN
1.0000 g | Freq: Every day | INTRAVENOUS | Status: DC
  Administered 2020-08-09: 02:00:00 1 g via INTRAVENOUS
  Filled 2020-08-09: qty 10

## 2020-08-09 MED ORDER — ASPIRIN EC 325 MG PO TBEC: 325.0000 mg | DELAYED_RELEASE_TABLET | Freq: Every day | ORAL | Status: DC

## 2020-08-09 MED ORDER — ASPIRIN 300 MG RE SUPP
300.0000 mg | Freq: Every day | RECTAL | Status: DC
  Administered 2020-08-09: 10:00:00 300 mg via RECTAL
  Filled 2020-08-09 (×2): qty 1

## 2020-08-09 MED ORDER — BACLOFEN 10 MG PO TABS: 20.0000 mg | ORAL_TABLET | Freq: Three times a day (TID) | ORAL | Status: DC | PRN

## 2020-08-09 MED ORDER — SODIUM CHLORIDE 0.9 % IV SOLN: INTRAVENOUS | Status: DC

## 2020-08-09 MED ORDER — ATORVASTATIN CALCIUM 10 MG PO TABS: 40.0000 mg | ORAL_TABLET | Freq: Every day | ORAL | Status: DC

## 2020-08-09 MED ORDER — POLYETHYLENE GLYCOL 3350 17 G PO PACK: 17.0000 g | PACK | Freq: Every day | ORAL | Status: DC

## 2020-08-09 MED ORDER — LURASIDONE HCL 40 MG PO TABS
120.0000 mg | ORAL_TABLET | Freq: Every day | ORAL | Status: DC
  Filled 2020-08-09: qty 3

## 2020-08-09 NOTE — ED Notes (Signed)
Patient had small amt. Of bile colored emesis on her gown, moved patient closed to nursing station for better observation. And patient cleaned.

## 2020-08-09 NOTE — Progress Notes (Signed)
PROGRESS NOTE  Heidi Blevins VEL:[id number] DOB: [birth year] DOA: 08/08/2020 PCP: [name], MD  Brief History   69 year old woman nursing home resident PMH schizoaffective disorder, smoker, lacunar infarct, essential hypertension PRESENTED as code stroke altered mental status, confusion, possible left facial droop, seen by neurology and admitted for further evaluation and to exclude acute CNS process.  Per documentation, son reports similar symptoms with previous UTIs.  Also reported alteration in her behavior after beginning paliperidone several months ago.  Found to have acute ischemic stroke, n.p.o., place temporary feeding tube for administration of psychiatric medications and tube feeds if needed.  A & P   Acute ischemic nonhemorrhage right lentiform nucleus infarct/CVA per MRI; with acute associated metabolic encephalopathy.  CTA negative for LVO. --Remain NPO.  Continue aspirin.  Further recommendations and work-up per neurology. --add statin  Acute encephalopathy complicated by underlying schizoaffective disorder, presume secondary to acute CVA --U/a+, CXR NAD --Check lithium level, hold lithium for now --consider ADR/med effect from polypharmacy  Pyuria --afebrile, normal WBC and diff.  Doubt infection.  Stop antibiotic.  Uncontrolled hypertension, possible hypertensive emergency SBP 220 --Stable.  Permissive hypertension  Schizoaffective disorder --Son reported altered behavior after beginning paliperidone IM every 30 days several months ago --Continue Cogentin, Prolixin, Latuda, hold lithium pending level --on Ativan QID, will decrease to BID given mental status --will ask pharmacy to review multiple meds and advices  Polypharmacy --plan as above  PMH lacunar infarct, cerebrovascular disease, cerebral atrophy, moderate chronic SVD.  Cigarette smoker --Recommend cessation  3.9 cm right thyroid nodule, indeterminate. Further evaluation with dedicated thyroid  ultrasound recommended for further evaluation. --Follow-up as an outpatient     Disposition Plan:  Discussion: will place [facility] to continue psych meds and for TF if needed  Status is: Inpatient  Remains inpatient appropriate because:Altered mental status, IV treatments appropriate due to intensity of illness or inability to take PO and Inpatient level of care appropriate due to severity of illness   Dispo: The patient is from: SNF              Anticipated d/c is to: SNF              Anticipated d/c date is: 3 days              Patient currently is not medically stable to d/c.  DVT prophylaxis: enoxaparin (LOVENOX) injection 40 mg Start: 08/09/20 0000   Code Status: Full Code Family Communication: son Reece Agar by telephone  Murray Hodgkins, MD  [facility] Direct contact: see [email or web address] (further directions at bottom of note if needed) 7PM-7AM contact night coverage as at bottom of note 08/09/2020, 11:11 AM  LOS: 1 day   Significant Hospital Events       Consults:   Neurology    Procedures:     Significant Diagnostic Tests:      Micro Data:      Antimicrobials:     Interval History/Subjective  CC: f/u stroke  Somnolent, no history  Objective   Vitals:  Vitals:   08/09/20 1015 08/09/20 1030  BP: (!) 169/97 (!) 170/98  Pulse: (!) 103 (!) 103  Resp: (!) 33 (!) 36  Temp:    SpO2: 96% 95%    Exam:  Constitutional:    Appears calm and comfortable lying on stretcher in ED Eyes:   pupils and irises appear normal, RRL; bilateral cataracts ENMT:   Cannot assess hearing   Lips appear normal Respiratory:   CTA bilaterally, no  w/r/r.   Respiratory effort normal. Cardiovascular:   RRR, no m/r/g  No LE extremity edema   Abdomen:   Abdomen appears normal; soft, ntnd Musculoskeletal:   RUE, LUE, RLE, LLE    Cannot assess strength or tone Skin:   No rashes, lesions, ulcers  palpation of skin: no induration or  nodules Neurologic:   Cannot assess Psychiatric:   Mental status  Cannot assess, somnolent    I have personally reviewed the following:   Today's Data   CBG stable  LDL 120  Hgb A1c 5.3  CMP unremarkable  CBC w/diff unremarkable  U/a grossly positive  EKG SR w/ RBBB, NSCSLT 06/21/20  Scheduled Meds:   stroke: mapping our early stages of recovery book   Does not apply Once   aspirin EC  325 mg Oral Daily   Or   aspirin  300 mg Rectal Daily   atorvastatin  40 mg Per Tube Daily   benztropine  1 mg Per Tube BID   enoxaparin (LOVENOX) injection  40 mg Subcutaneous QHS   [START ON 08/10/2020] fluPHENAZine  20 mg Per Tube Daily   LORazepam  0.5 mg Per Tube BID   [START ON 08/10/2020] lurasidone  120 mg Per Tube Daily   [START ON 08/10/2020] polyethylene glycol  17 g Per Tube Daily   polyvinyl alcohol  1 drop Both Eyes TID   Continuous Infusions:  sodium chloride 50 mL/hr at 08/09/20 0950    Principal Problem:   Acute CVA (cerebrovascular accident) [facility]) Active Problems:   Dehydration   Acute encephalopathy   Schizoaffective disorder ([place])   UTI (urinary tract infection)   Essential hypertension   CVA (cerebral vascular accident) ([place])   Polypharmacy   Thyroid nodule   LOS: 1 day   How to contact the [facility] Attending or Consulting provider [place] or covering provider during after hours [place], for this patient?  1. Check the care team in [facility] and look for a) attending/consulting [facility] provider listed and b) the [facility] team listed 2. Log into [email or web address] and use [place] universal password to access. If you do not have the password, please contact the hospital operator. 3. Locate the [facility] provider you are looking for under [facility] and page to a number that you can be directly reached. 4. If you still have difficulty reaching the provider, please page the [facility] (Director on Call) for the Hospitalists listed on amion for assistance.

## 2020-08-09 NOTE — Progress Notes (Signed)
EEG complete - results pending 

## 2020-08-09 NOTE — ED Notes (Signed)
Rosalin Hawking MD was updated by this RN that pt has not received PO medications d/t NPO status from failing swallow screen. MD informed this RN he will make rounds & update the son at bedside during that time, son was informed.

## 2020-08-09 NOTE — Progress Notes (Signed)
Pt became very  aggressively agitated and attempted pulling out  Cor tract   TF to the 15 cm mark to t the bridge of the nostril though pt had soft mittens on both hands.MD notify awaiting a return call from MD

## 2020-08-09 NOTE — Progress Notes (Signed)
STROKE TEAM PROGRESS NOTE   INTERVAL HISTORY Her RN is at the bedside.  Patient lying in bed, global aphasia, not following commands.  Seem to have left upper extremity weakness, however bilateral lower extremity withdraws symmetrically.  Found to have mild UTI, on Rocephin.  MRI showed small/punctate right lateral BG infarct, presumably not able to explain patient profound symptoms.  Will do EEG.  Vitals:   08/09/20 0815 08/09/20 0830 08/09/20 0845 08/09/20 0900  BP: (!) 163/91 (!) 157/91 (!) 167/91 (!) 171/93  Pulse: 100 98 (!) 101 99  Resp: (!) 27 (!) 26 (!) 24 (!) 26  Temp:      TempSrc:      SpO2: 97% 96% 96% 96%  Weight:      Height:       CBC:  Recent Labs  Lab 08/08/20 2157 08/08/20 2236  WBC 8.9 8.7  NEUTROABS  --  6.8  HGB 12.7 12.7  HCT 41.7 41.7  MCV 102.2* 102.5*  PLT 225 381   Basic Metabolic Panel:  Recent Labs  Lab 08/08/20 2141 08/08/20 2149  NA 137 141  K 3.9 3.9  CL 107 108  CO2 22  --   GLUCOSE 162* 134*  BUN 8 11  CREATININE 0.95 0.80  CALCIUM 10.5*  --    Lipid Panel:  Recent Labs  Lab 08/09/20 0805  CHOL 191  TRIG 55  HDL 60  CHOLHDL 3.2  VLDL 11  LDLCALC 120*   HgbA1c:  Recent Labs  Lab 08/09/20 0805  HGBA1C 5.3   Urine Drug Screen: No results for input(s): LABOPIA, COCAINSCRNUR, LABBENZ, AMPHETMU, THCU, LABBARB in the last 168 hours.  Alcohol Level No results for input(s): ETH in the last 168 hours.  IMAGING past 24 hours MR BRAIN WO CONTRAST  Result Date: 08/09/2020 CLINICAL DATA:  Follow-up examination for acute stroke. EXAM: MRI HEAD WITHOUT CONTRAST TECHNIQUE: Multiplanar, multiecho pulse sequences of the brain and surrounding structures were obtained without intravenous contrast. COMPARISON:  Prior CT and CTA from 08/08/2020. FINDINGS: Brain: Examination degraded by motion artifact. Generalized age-related cerebral atrophy. Patchy and confluent T2/FLAIR hyperintensity within the periventricular deep white matter both  cerebral hemispheres most consistent with chronic small vessel ischemic disease, moderate in nature. Patchy involvement of the pons noted. Small remote lacunar infarct noted at the right lentiform nucleus. 7 mm focus of restricted diffusion seen involving the posterior right lentiform nucleus, consistent with a small acute ischemic infarct (series 5, image 78). No associated hemorrhage or mass effect. No other diffusion abnormality to suggest acute or subacute ischemia. Gray-white matter differentiation otherwise maintained. No encephalomalacia to suggest chronic cortical infarction. No other evidence for acute or chronic intracranial blood products. No mass lesion, midline shift or mass effect. No hydrocephalus or extra-axial fluid collection. Pituitary gland suprasellar region normal. Midline structures intact. Vascular: Major intracranial vascular flow voids are maintained. Skull and upper cervical spine: Craniocervical junction within normal limits. Bone marrow signal intensity normal. No scalp soft tissue abnormality. Sinuses/Orbits: Globes and orbital soft tissues within normal limits. Scattered mucosal thickening noted within the ethmoidal air cells and maxillary sinuses. Paranasal sinuses are otherwise clear. No mastoid effusion. Inner ear structures grossly normal. Other: None. IMPRESSION: 1. 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 2. Underlying age-related cerebral atrophy with moderate chronic small vessel ischemic disease. Additional remote lacunar infarct at the right lentiform nucleus. Electronically Signed   By: Jeannine Boga M.D.   On: 08/09/2020 00:49   DG Chest [facility]  1 View  Result Date: 08/08/2020 CLINICAL DATA:  Dyspnea EXAM: PORTABLE CHEST 1 VIEW COMPARISON:  06/20/2020 FINDINGS: Lungs are clear. No pneumothorax or pleural effusion. Mild cardiomegaly is stable. Tortuosity of the thoracic aorta is unchanged. Moderate thoracic dextroscoliosis again noted with  Harrington rod in place. No acute bone abnormality. IMPRESSION: No active disease. Electronically Signed   By: Fidela Salisbury MD   On: 08/08/2020 22:48   CT HEAD CODE STROKE WO CONTRAST  Result Date: 08/08/2020 CLINICAL DATA:  Code stroke. Initial evaluation for neuro deficit, stroke suspected. EXAM: CT HEAD WITHOUT CONTRAST TECHNIQUE: Contiguous axial images were obtained from the base of the skull through the vertex without intravenous contrast. COMPARISON:  Prior head CT from 06/20/2020 FINDINGS: Brain: Generalized age-related cerebral atrophy with moderate chronic microvascular ischemic disease, stable from previous. Remote lacunar infarct present at the right lentiform nucleus. No acute intracranial hemorrhage. No acute large vessel territory infarct. No mass lesion, midline shift or mass effect. No hydrocephalus or extra-axial fluid collection. Vascular: No asymmetric hyperdense vessel. Scattered vascular calcifications noted within the carotid siphons. Skull: Scalp soft tissues and calvarium within normal limits. Sinuses/Orbits: Globes orbital soft tissues demonstrate no acute finding. Mild mucosal thickening noted within the ethmoidal air cells. Paranasal sinuses are otherwise clear. No mastoid effusion. Other: None. ASPECTS [facility] Stroke Program Early CT Score) - Ganglionic level infarction (caudate, lentiform nuclei, internal capsule, insula, M1-M3 cortex): 7 - Supraganglionic infarction (M4-M6 cortex): 3 Total score (0-10 with 10 being normal): 10 IMPRESSION: 1. No acute intracranial infarct or other abnormality. 2. ASPECTS is 10. 3. Age-related cerebral atrophy with chronic microvascular ischemic disease. Small remote lacunar infarct at the right lentiform nucleus. These results were communicated to Dr. Theda Sers at 10:06 pmon 12/12/2021by text page via the [facility] messaging system. Electronically Signed   By: Jeannine Boga M.D.   On: 08/08/2020 22:12   CT ANGIO HEAD CODE STROKE  Result  Date: 08/08/2020 CLINICAL DATA:  Follow-up examination for acute stroke. Unresponsive. Six EXAM: CT ANGIOGRAPHY HEAD AND NECK TECHNIQUE: Multidetector CT imaging of the head and neck was performed using the standard protocol during bolus administration of intravenous contrast. Multiplanar CT image reconstructions and MIPs were obtained to evaluate the vascular anatomy. Carotid stenosis measurements (when applicable) are obtained utilizing NASCET criteria, using the distal internal carotid diameter as the denominator. CONTRAST:  26mL OMNIPAQUE IOHEXOL 350 MG/ML SOLN COMPARISON:  Prior head CT from earlier the same day. FINDINGS: CTA NECK FINDINGS Aortic arch: Sized visualized aortic arch of normal caliber with normal 3 vessel morphology. Mild atheromatous change within the undersurface of the arch itself. No hemodynamically significant stenosis about the origin of the great vessels. Right carotid system: Right common and internal carotid arteries are mildly tortuous but are widely patent without stenosis, dissection or occlusion. Left carotid system: Left common and internal carotid arteries somewhat diffusely tortuous but are widely patent without stenosis, dissection or occlusion. Vertebral arteries: Both vertebral arteries arise from the subclavian arteries. Atheromatous plaque at the origin of the left vertebral artery with associated mild ostial stenosis. No proximal subclavian artery stenosis. Vertebral arteries otherwise tortuous but widely patent within the neck without stenosis, dissection or occlusion. Skeleton: No visible acute osseous abnormality. Scoliosis with Harrington rod fixation partially visualized. No discrete or worrisome osseous lesions. Patient is edentulous. Other neck: No other acute soft tissue abnormality within the neck. 3.9 cm right thyroid nodule (series 5, image 132). No other mass or adenopathy. Upper chest: Visualized upper chest demonstrates no acute  finding. Review of the MIP  images confirms the above findings CTA HEAD FINDINGS Anterior circulation: Petrous segments widely patent bilaterally. Scattered atheromatous change within the carotid siphons bilaterally. No more than mild stenosis on the right. More mild to moderate stenosis noted about the cavernous left ICA. Right A1 widely patent. Left A1 hypoplastic, accounting for the diminutive left ICA is compared to the right. Normal anterior communicating artery complex. ACAs somewhat tortuous but are widely patent without stenosis. No M1 stenosis or occlusion. Negative MCA bifurcations. Distal MCA branches well perfused and symmetric. Posterior circulation: Both V4 segments widely patent to the vertebrobasilar junction without stenosis. Origin of the right PICA patent and normal. Left PICA origin not well seen. Basilar somewhat diffusely irregular but is widely patent to its distal aspect without significant stenosis. Superior cerebral arteries patent bilaterally. Both PCAs primarily supplied via the basilar well perfused to their distal aspects. Venous sinuses: Grossly patent allowing for timing the contrast bolus. Anatomic variants: Hypoplastic left A1 segment with the ACAs primarily supplied via the right carotid artery system. No aneurysm. Review of the MIP images confirms the above findings IMPRESSION: 1. Negative CTA for emergent large vessel occlusion. 2. Mild for age atheromatous change about the carotid siphons with associated mild to moderate multifocal narrowing, left worse than right. No other hemodynamically significant or correctable stenosis about the major arterial vasculature of the head and neck. 3. Diffuse tortuosity about the major arterial vasculature of the head and neck, suggesting chronic underlying hypertension. 4. 3.9 cm right thyroid nodule, indeterminate. Further evaluation with dedicated thyroid ultrasound recommended for further evaluation. This could be performed on a nonemergent outpatient basis. (ref: J  Am Coll Radiol. 2015 Feb;12(2): 143-50). These results were communicated to Dr. Theda Sers at 10:31 pmon 12/12/2021by text page via the [facility] messaging system. Electronically Signed   By: Jeannine Boga M.D.   On: 08/08/2020 22:41   CT ANGIO NECK CODE STROKE  Result Date: 08/08/2020 CLINICAL DATA:  Follow-up examination for acute stroke. Unresponsive. Six EXAM: CT ANGIOGRAPHY HEAD AND NECK TECHNIQUE: Multidetector CT imaging of the head and neck was performed using the standard protocol during bolus administration of intravenous contrast. Multiplanar CT image reconstructions and MIPs were obtained to evaluate the vascular anatomy. Carotid stenosis measurements (when applicable) are obtained utilizing NASCET criteria, using the distal internal carotid diameter as the denominator. CONTRAST:  16mL OMNIPAQUE IOHEXOL 350 MG/ML SOLN COMPARISON:  Prior head CT from earlier the same day. FINDINGS: CTA NECK FINDINGS Aortic arch: Sized visualized aortic arch of normal caliber with normal 3 vessel morphology. Mild atheromatous change within the undersurface of the arch itself. No hemodynamically significant stenosis about the origin of the great vessels. Right carotid system: Right common and internal carotid arteries are mildly tortuous but are widely patent without stenosis, dissection or occlusion. Left carotid system: Left common and internal carotid arteries somewhat diffusely tortuous but are widely patent without stenosis, dissection or occlusion. Vertebral arteries: Both vertebral arteries arise from the subclavian arteries. Atheromatous plaque at the origin of the left vertebral artery with associated mild ostial stenosis. No proximal subclavian artery stenosis. Vertebral arteries otherwise tortuous but widely patent within the neck without stenosis, dissection or occlusion. Skeleton: No visible acute osseous abnormality. Scoliosis with Harrington rod fixation partially visualized. No discrete or worrisome  osseous lesions. Patient is edentulous. Other neck: No other acute soft tissue abnormality within the neck. 3.9 cm right thyroid nodule (series 5, image 132). No other mass or adenopathy. Upper chest: Visualized upper  chest demonstrates no acute finding. Review of the MIP images confirms the above findings CTA HEAD FINDINGS Anterior circulation: Petrous segments widely patent bilaterally. Scattered atheromatous change within the carotid siphons bilaterally. No more than mild stenosis on the right. More mild to moderate stenosis noted about the cavernous left ICA. Right A1 widely patent. Left A1 hypoplastic, accounting for the diminutive left ICA is compared to the right. Normal anterior communicating artery complex. ACAs somewhat tortuous but are widely patent without stenosis. No M1 stenosis or occlusion. Negative MCA bifurcations. Distal MCA branches well perfused and symmetric. Posterior circulation: Both V4 segments widely patent to the vertebrobasilar junction without stenosis. Origin of the right PICA patent and normal. Left PICA origin not well seen. Basilar somewhat diffusely irregular but is widely patent to its distal aspect without significant stenosis. Superior cerebral arteries patent bilaterally. Both PCAs primarily supplied via the basilar well perfused to their distal aspects. Venous sinuses: Grossly patent allowing for timing the contrast bolus. Anatomic variants: Hypoplastic left A1 segment with the ACAs primarily supplied via the right carotid artery system. No aneurysm. Review of the MIP images confirms the above findings IMPRESSION: 1. Negative CTA for emergent large vessel occlusion. 2. Mild for age atheromatous change about the carotid siphons with associated mild to moderate multifocal narrowing, left worse than right. No other hemodynamically significant or correctable stenosis about the major arterial vasculature of the head and neck. 3. Diffuse tortuosity about the major arterial  vasculature of the head and neck, suggesting chronic underlying hypertension. 4. 3.9 cm right thyroid nodule, indeterminate. Further evaluation with dedicated thyroid ultrasound recommended for further evaluation. This could be performed on a nonemergent outpatient basis. (ref: J Am Coll Radiol. 2015 Feb;12(2): 143-50). These results were communicated to Dr. Theda Sers at 10:31 pmon 12/12/2021by text page via the [facility] messaging system. Electronically Signed   By: Jeannine Boga M.D.   On: 08/08/2020 22:41    PHYSICAL EXAM  Temp:  [98.5 F (36.9 C)-100.2 F (37.9 C)] 99.2 F (37.3 C) (12/13 1604) Pulse Rate:  [73-116] 103 (12/13 1604) Resp:  [16-36] 18 (12/13 1604) BP: (152-185)/(82-122) 152/92 (12/13 1604) SpO2:  [95 %-99 %] 97 % (12/13 1604) Weight:  [67 kg] 67 kg (12/12 2221)  General - Well nourished, well developed, in no apparent distress.  Ophthalmologic - fundi not visualized due to noncooperation.  Cardiovascular - Regular rhythm and rate.  Neuro -lethargic, eyes open, nonverbal recently moaning sound, not for commands.  Tracking bilaterally, no gaze palsy.  Blinking to visual threat bilaterally.  PERRL.  Seem to have mild right nasolabial fold flattening, not sure if her chronic.  Tongue protrusion not corporative.  Right upper extremity with mild drift and withdraw to pain, left upper extremity flaccid, no movement with pain.  Bilateral lower extremity 2/5 withdrawal on cancellation. Sensation, coordination not corporative and gait not tested.    ASSESSMENT/PLAN Heidi Blevins is a 69 y.o. female with history of AKI, UTI, schizoaffective disease  presenting from SNF with altered mental status and L facial droop. Vomited in the field and BP was 220/110.  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 Small vessel disease.  Atrophy. 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 ASPECTS 10.     CTA head & neck no LVO. Mild B cavernous ICA atherosclerosis L>R. Tortuosity of head and neck. R thyroid nodule.  MRI  R lentiform nucleus infarct. Small vessel disease. Atrophy. 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  2D Echo EF 65-70%. No source of embolus   LDL 120  HgbA1c 5.3  VTE prophylaxis - Lovenox 40 mg sq daily   aspirin 81 mg daily prior to admission, now on aspirin 300 PR mg daily.   Therapy recommendations:  pending   Disposition:  pending  (from SNF)  Encephalopathy  UTI  Patient mental status change out of proportion to the size of stroke  UA WBC 21-50  Urine culture pending  Ammonia level pending  On Rocephin  EEG pending  Hypertensive Urgency  BP 220/110 on admission   Stable on the high end  Gradually normalize BP in 2 to 3 days . Long-term BP goal normotensive  Hyperlipidemia  Home meds:  No statin  Now on lipitor 40  LDL 120, goal < 70  Continue statin at discharge  Dysphagia . Secondary to stroke . NPO . Speech on board  Tobacco abuse  Current smoker  Smoking cessation counseling will be provided   Other Stroke Risk Factors  Advanced Age >/= 48   Family hx stroke (mother)  Other Active Problems  Schizoaffective d/o on lithium -lithium level low  Polypharmacy  R thyroid nodule  Hospital day # 1  Rosalin Hawking, MD PhD Stroke Neurology 08/09/2020 7:57 PM    To contact Stroke Continuity provider, please refer to [email or web address] After hours, contact General Neurology

## 2020-08-09 NOTE — ED Notes (Signed)
Upon rounding on patient, patient had vomited samll amt. Bile colored emesis, cleaned patient.

## 2020-08-09 NOTE — ED Notes (Signed)
Speech Therapy at bedside

## 2020-08-09 NOTE — H&P (Signed)
History and Physical    [name] MEQ:[id number] DOB: [birth year] DOA: 08/08/2020  PCP: Heidi Poll, MD (Confirm with patient/family/NH records and if not entered, this has to be entered at [facility] point of entry) Patient coming from: long term care facility  I have personally briefly reviewed patient's old medical records in [place]  Chief Complaint: altered mental status  HPI: Heidi Blevins is a 69 y.o. female with medical history significant of  AKI, UTI, schizoaffective disease here presenting altered mental status.  Patient is from [facility] home. Her last normal was around 4:30 pm as per nursing staff. She was noted to be altered, confused, and has L facial droop so EMS was called. EMS called code stroke and she vomited in the field. Mr. Heidi Blevins, son, provided much of her history, which includes several previous presentations similar to what he saw today due to UTIs. He also reports that she was started on the past several months on paliperidone and that she seems to have altered behavior afterwards.  Was noted to be hypertensive 220/100 per EMS.   ED Course: T 98.5  185/106  HR 76  RR 17. Reviewed EDP note which was notable for diminished breath sounds, L facial drop, confusion but moving all extremities but not to command. Dr. Theda Sers for neurology saw patient: on his exam patient was awake but nonverbal, visual fields were full, nl facial sensation to touch, nl facial movement, DTRs 2+ symmetric. Lab revealed nl CBC and diff, nl Cmet except for mild elevation in glucose, calcium. CT head negative, CTA head/neck negative for LVO, MRI brain pending. CXR NAD. TRH called to admit patient for further evaluation and treatment.   Review of Systems: Unobtainable from obtunded patient.    Past Medical History:  Diagnosis Date  . Anxiety   . Arrhythmia   . Bifascicular block   . Depression   . Hypertension   . Insomnia   . Schizoaffective disorder    . Scoliosis   . Vitamin D deficiency     Past Surgical History:  Procedure Laterality Date  . BACK SURGERY     "as a child after she fell out of a tree"  . TONSILLECTOMY    . TOTAL HIP ARTHROPLASTY Left 09/27/2018   Procedure: LEFT TOTAL HIP ARTHROPLASTY ANTERIOR APPROACH;  Surgeon: Leandrew Koyanagi, MD;  Location: [place];  Service: Orthopedics;  Laterality: Left;   Soc Hx - long term care resident due to schizoaffective disorder. She has two sons who are very attentive.     reports that she has been smoking cigarettes. She has a 10.00 pack-year smoking history. She has never used smokeless tobacco. She reports that she does not drink alcohol and does not use drugs.  Allergies  Allergen Reactions  . Sulfonamide Derivatives Hives         Family History  Problem Relation Age of Onset  . CVA Mother   . Hypertension Mother   . Lupus Sister   . Hypertension Brother   . Peptic Ulcer Disease Father   . Colon cancer Neg Hx      Prior to Admission medications   Medication Sig Start Date End Date Taking? Authorizing Provider  acetaminophen (TYLENOL) 325 MG tablet Take 650 mg by mouth every 4 (four) hours as needed for mild pain, moderate pain, fever or headache.   Yes [provider]  acetaminophen (TYLENOL) 500 MG tablet Take 500 mg by mouth daily.    Yes  [provider]  aspirin EC 81 MG EC tablet Take 1 tablet (81 mg total) by mouth daily. 05/06/16  Yes Patrecia Pour, Christean Grief, MD  baclofen (LIORESAL) 20 MG tablet Take 20 mg by mouth 3 (three) times daily as needed for muscle spasms.    Yes [provider]  benztropine (COGENTIN) 1 MG tablet Take 1 mg by mouth 2 (two) times daily.   Yes [provider]  Cranberry 425 MG CAPS Take 425 mg by mouth 2 (two) times a day.   Yes [provider]  ferrous [id number] C-folic acid (FEROCON) capsule Take 1 capsule by mouth at bedtime.   Yes [provider]  fluPHENAZine (PROLIXIN) 10 MG  tablet Take 20 mg by mouth daily.    Yes [provider]  lisinopril (ZESTRIL) 40 MG tablet Take 40 mg by mouth daily. 07/29/20  Yes [provider]  lithium carbonate 150 MG capsule Take 150 mg by mouth at bedtime. 09/11/18  Yes [provider]  lithium carbonate 150 MG capsule Take 300 mg by mouth in the morning.   Yes [provider]  loratadine (CLARITIN) 10 MG tablet Take 10 mg by mouth daily.   Yes [provider]  LORazepam (ATIVAN) 0.5 MG tablet Take 1 tablet (0.5 mg total) by mouth 4 (four) times daily. 10/03/18  Yes Desiree Hane, MD  Lurasidone HCl (LATUDA) 120 MG TABS Take 120 mg by mouth daily.   Yes [provider]  Melatonin 5 MG TABS Take 5 mg by mouth at bedtime.   Yes [provider]  OVER THE COUNTER MEDICATION Take 1 Bottle by mouth 3 (three) times daily. Mighty shakes   Yes [provider]  paliperidone (INVEGA SUSTENNA) 156 MG/ML SUSY injection Inject 156 mg into the muscle every 30 (thirty) days.   Yes [provider]  polyethylene glycol powder (GLYCOLAX/MIRALAX) 17 GM/SCOOP powder Take 17 g by mouth daily. Mix in 8 oz of fluid and drink by mouth.   Yes [provider]  Pramoxine HCl (VAGISIL ANTI-ITCH MEDICATED) 1 % MISC Apply vaginally once a day Apply 1 application topically daily as needed (for vaginal itching). Apply vaginally once a day Patient taking differently: Apply 1 application topically daily as needed (for vaginal itching). 03/25/19  Yes Eugenie Filler, MD  Propylene Glycol 0.6 % SOLN Place 1 drop into both eyes 3 (three) times daily.    Yes [provider]  sertraline (ZOLOFT) 100 MG tablet Take 100 mg by mouth daily.   Yes [provider]  cephALEXin (KEFLEX) 500 MG capsule Take 500 mg by mouth 2 (two) times daily. 07/26/20   [provider]    Physical Exam: Vitals:   08/08/20 2315 08/08/20 2330 08/08/20 2344 08/09/20 0045  BP: (!)  185/105 (!) 181/95  (!) 168/122  Pulse: 76 81  94  Resp: 17 19  (!) 23  Temp:      TempSrc:      SpO2: 98% 99% 99% 99%  Weight:      Height:         Vitals:   08/08/20 2315 08/08/20 2330 08/08/20 2344 08/09/20 0045  BP: (!) 185/105 (!) 181/95  (!) 168/122  Pulse: 76 81  94  Resp: 17 19  (!) 23  Temp:      TempSrc:      SpO2: 98% 99% 99% 99%  Weight:      Height:       General: WNWD woman  who is [place]. Eyes: bilateral arcus senilis, PERRL, lids and conjunctivae normal ENMT: Mucous membranes are appear dry. Posterior pharynx clear of any exudate or lesions.  Neck: normal, supple, no masses, no thyromegaly Respiratory: wet upper airway sound of uncleared secretions. Lungs clear to auscultation bilaterally, no wheezing, no crackles. Normal respiratory effort. No accessory muscle use.  Cardiovascular: Regular rate and rhythm, no murmurs / rubs / gallops. No extremity edema. 2+ pedal pulses. No carotid bruits.  Abdomen: protuberant, no tenderness, no masses palpated. No hepatosplenomegaly. Bowel sounds positive.  Musculoskeletal: no clubbing / cyanosis. No joint deformity upper and lower extremities. Good ROM, no contractures. Normal muscle tone.  Skin: no rashes, lesions, ulcers. No induration Neurologic: CN nl facial symmetry, no facial muscle movement, EOMI appear intact, pupils reactive to light. MS - flaccid in all extremities. DTRs - could not elicit DTRs UE/LE. Reacts to painful stimuli. Toes do not move to firm stimulation of soles.  Psychiatric: obtunded - responds to deep pain but not verbal stimuli.     Labs on Admission: I have personally reviewed following labs and imaging studies  CBC: Recent Labs  Lab 08/08/20 2149 08/08/20 2157 08/08/20 2236  WBC  --  8.9 8.7  NEUTROABS  --   --  6.8  HGB 14.3 12.7 12.7  HCT 42.0 41.7 41.7  MCV  --  102.2* 102.5*  PLT  --  225 409   Basic Metabolic Panel: Recent Labs  Lab 08/08/20 2141 08/08/20 2149  NA 137 141   K 3.9 3.9  CL 107 108  CO2 22  --   GLUCOSE 162* 134*  BUN 8 11  CREATININE 0.95 0.80  CALCIUM 10.5*  --    GFR: Estimated Creatinine Clearance: 59.7 mL/min (by C-G formula based on SCr of 0.8 mg/dL). Liver Function Tests: Recent Labs  Lab 08/08/20 2141  AST 16  ALT 14  ALKPHOS 95  BILITOT 0.5  PROT 7.2  ALBUMIN 3.8   No results for input(s): LIPASE, AMYLASE in the last 168 hours. No results for input(s): AMMONIA in the last 168 hours. Coagulation Profile: Recent Labs  Lab 08/08/20 2141  INR 1.1   Cardiac Enzymes: No results for input(s): CKTOTAL, CKMB, CKMBINDEX, TROPONINI in the last 168 hours. BNP (last 3 results) No results for input(s): PROBNP in the last 8760 hours. HbA1C: No results for input(s): HGBA1C in the last 72 hours. CBG: Recent Labs  Lab 08/08/20 2226  GLUCAP 139*   Lipid Profile: No results for input(s): CHOL, HDL, LDLCALC, TRIG, CHOLHDL, LDLDIRECT in the last 72 hours. Thyroid Function Tests: No results for input(s): TSH, T4TOTAL, FREET4, T3FREE, THYROIDAB in the last 72 hours. Anemia Panel: No results for input(s): VITAMINB12, FOLATE, FERRITIN, TIBC, IRON, RETICCTPCT in the last 72 hours. Urine analysis:    Component Value Date/Time   COLORURINE YELLOW 06/20/2020 [place] 06/20/2020 1658   LABSPEC 1.010 06/20/2020 1658   PHURINE 6.0 06/20/2020 1658   GLUCOSEU NEGATIVE 06/20/2020 1658   HGBUR NEGATIVE 06/20/2020 1658   BILIRUBINUR NEGATIVE 06/20/2020 [place] 06/20/2020 1658   PROTEINUR NEGATIVE 06/20/2020 1658   UROBILINOGEN 1.0 08/05/2011 1751   NITRITE NEGATIVE 06/20/2020 1658   LEUKOCYTESUR SMALL (A) 06/20/2020 1658    Radiological Exams on Admission: MR BRAIN WO CONTRAST  Result Date: 08/09/2020 CLINICAL DATA:  Follow-up examination for acute stroke. EXAM: MRI HEAD WITHOUT CONTRAST TECHNIQUE: Multiplanar, multiecho pulse sequences of the brain and surrounding structures were obtained without  intravenous  contrast. COMPARISON:  Prior CT and CTA from 08/08/2020. FINDINGS: Brain: Examination degraded by motion artifact. Generalized age-related cerebral atrophy. Patchy and confluent T2/FLAIR hyperintensity within the periventricular deep white matter both cerebral hemispheres most consistent with chronic small vessel ischemic disease, moderate in nature. Patchy involvement of the pons noted. Small remote lacunar infarct noted at the right lentiform nucleus. 7 mm focus of restricted diffusion seen involving the posterior right lentiform nucleus, consistent with a small acute ischemic infarct (series 5, image 78). No associated hemorrhage or mass effect. No other diffusion abnormality to suggest acute or subacute ischemia. Gray-white matter differentiation otherwise maintained. No encephalomalacia to suggest chronic cortical infarction. No other evidence for acute or chronic intracranial blood products. No mass lesion, midline shift or mass effect. No hydrocephalus or extra-axial fluid collection. Pituitary gland suprasellar region normal. Midline structures intact. Vascular: Major intracranial vascular flow voids are maintained. Skull and upper cervical spine: Craniocervical junction within normal limits. Bone marrow signal intensity normal. No scalp soft tissue abnormality. Sinuses/Orbits: Globes and orbital soft tissues within normal limits. Scattered mucosal thickening noted within the ethmoidal air cells and maxillary sinuses. Paranasal sinuses are otherwise clear. No mastoid effusion. Inner ear structures grossly normal. Other: None. IMPRESSION: 1. 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 2. Underlying age-related cerebral atrophy with moderate chronic small vessel ischemic disease. Additional remote lacunar infarct at the right lentiform nucleus. Electronically Signed   By: Jeannine Boga M.D.   On: 08/09/2020 00:49   DG Chest Port 1 View  Result Date:  08/08/2020 CLINICAL DATA:  Dyspnea EXAM: PORTABLE CHEST 1 VIEW COMPARISON:  06/20/2020 FINDINGS: Lungs are clear. No pneumothorax or pleural effusion. Mild cardiomegaly is stable. Tortuosity of the thoracic aorta is unchanged. Moderate thoracic dextroscoliosis again noted with Harrington rod in place. No acute bone abnormality. IMPRESSION: No active disease. Electronically Signed   By: Fidela Salisbury MD   On: 08/08/2020 22:48   CT HEAD CODE STROKE WO CONTRAST  Result Date: 08/08/2020 CLINICAL DATA:  Code stroke. Initial evaluation for neuro deficit, stroke suspected. EXAM: CT HEAD WITHOUT CONTRAST TECHNIQUE: Contiguous axial images were obtained from the base of the skull through the vertex without intravenous contrast. COMPARISON:  Prior head CT from 06/20/2020 FINDINGS: Brain: Generalized age-related cerebral atrophy with moderate chronic microvascular ischemic disease, stable from previous. Remote lacunar infarct present at the right lentiform nucleus. No acute intracranial hemorrhage. No acute large vessel territory infarct. No mass lesion, midline shift or mass effect. No hydrocephalus or extra-axial fluid collection. Vascular: No asymmetric hyperdense vessel. Scattered vascular calcifications noted within the carotid siphons. Skull: Scalp soft tissues and calvarium within normal limits. Sinuses/Orbits: Globes orbital soft tissues demonstrate no acute finding. Mild mucosal thickening noted within the ethmoidal air cells. Paranasal sinuses are otherwise clear. No mastoid effusion. Other: None. ASPECTS [facility] Stroke Program Early CT Score) - Ganglionic level infarction (caudate, lentiform nuclei, internal capsule, insula, M1-M3 cortex): 7 - Supraganglionic infarction (M4-M6 cortex): 3 Total score (0-10 with 10 being normal): 10 IMPRESSION: 1. No acute intracranial infarct or other abnormality. 2. ASPECTS is 10. 3. Age-related cerebral atrophy with chronic microvascular ischemic disease. Small remote  lacunar infarct at the right lentiform nucleus. These results were communicated to Dr. Theda Sers at 10:06 pmon 12/12/2021by text page via the [facility] messaging system. Electronically Signed   By: Jeannine Boga M.D.   On: 08/08/2020 22:12   CT ANGIO HEAD CODE STROKE  Result Date: 08/08/2020 CLINICAL DATA:  Follow-up examination for acute stroke.  Unresponsive. Six EXAM: CT ANGIOGRAPHY HEAD AND NECK TECHNIQUE: Multidetector CT imaging of the head and neck was performed using the standard protocol during bolus administration of intravenous contrast. Multiplanar CT image reconstructions and MIPs were obtained to evaluate the vascular anatomy. Carotid stenosis measurements (when applicable) are obtained utilizing NASCET criteria, using the distal internal carotid diameter as the denominator. CONTRAST:  30mL OMNIPAQUE IOHEXOL 350 MG/ML SOLN COMPARISON:  Prior head CT from earlier the same day. FINDINGS: CTA NECK FINDINGS Aortic arch: Sized visualized aortic arch of normal caliber with normal 3 vessel morphology. Mild atheromatous change within the undersurface of the arch itself. No hemodynamically significant stenosis about the origin of the great vessels. Right carotid system: Right common and internal carotid arteries are mildly tortuous but are widely patent without stenosis, dissection or occlusion. Left carotid system: Left common and internal carotid arteries somewhat diffusely tortuous but are widely patent without stenosis, dissection or occlusion. Vertebral arteries: Both vertebral arteries arise from the subclavian arteries. Atheromatous plaque at the origin of the left vertebral artery with associated mild ostial stenosis. No proximal subclavian artery stenosis. Vertebral arteries otherwise tortuous but widely patent within the neck without stenosis, dissection or occlusion. Skeleton: No visible acute osseous abnormality. Scoliosis with Harrington rod fixation partially visualized. No discrete or  worrisome osseous lesions. Patient is edentulous. Other neck: No other acute soft tissue abnormality within the neck. 3.9 cm right thyroid nodule (series 5, image 132). No other mass or adenopathy. Upper chest: Visualized upper chest demonstrates no acute finding. Review of the MIP images confirms the above findings CTA HEAD FINDINGS Anterior circulation: Petrous segments widely patent bilaterally. Scattered atheromatous change within the carotid siphons bilaterally. No more than mild stenosis on the right. More mild to moderate stenosis noted about the cavernous left ICA. Right A1 widely patent. Left A1 hypoplastic, accounting for the diminutive left ICA is compared to the right. Normal anterior communicating artery complex. ACAs somewhat tortuous but are widely patent without stenosis. No M1 stenosis or occlusion. Negative MCA bifurcations. Distal MCA branches well perfused and symmetric. Posterior circulation: Both V4 segments widely patent to the vertebrobasilar junction without stenosis. Origin of the right PICA patent and normal. Left PICA origin not well seen. Basilar somewhat diffusely irregular but is widely patent to its distal aspect without significant stenosis. Superior cerebral arteries patent bilaterally. Both PCAs primarily supplied via the basilar well perfused to their distal aspects. Venous sinuses: Grossly patent allowing for timing the contrast bolus. Anatomic variants: Hypoplastic left A1 segment with the ACAs primarily supplied via the right carotid artery system. No aneurysm. Review of the MIP images confirms the above findings IMPRESSION: 1. Negative CTA for emergent large vessel occlusion. 2. Mild for age atheromatous change about the carotid siphons with associated mild to moderate multifocal narrowing, left worse than right. No other hemodynamically significant or correctable stenosis about the major arterial vasculature of the head and neck. 3. Diffuse tortuosity about the major arterial  vasculature of the head and neck, suggesting chronic underlying hypertension. 4. 3.9 cm right thyroid nodule, indeterminate. Further evaluation with dedicated thyroid ultrasound recommended for further evaluation. This could be performed on a nonemergent outpatient basis. (ref: J Am Coll Radiol. 2015 Feb;12(2): 143-50). These results were communicated to Dr. Theda Sers at 10:31 pmon 12/12/2021by text page via the [facility] messaging system. Electronically Signed   By: Jeannine Boga M.D.   On: 08/08/2020 22:41   CT ANGIO NECK CODE STROKE  Result Date: 08/08/2020 CLINICAL DATA:  Follow-up examination  for acute stroke. Unresponsive. Six EXAM: CT ANGIOGRAPHY HEAD AND NECK TECHNIQUE: Multidetector CT imaging of the head and neck was performed using the standard protocol during bolus administration of intravenous contrast. Multiplanar CT image reconstructions and MIPs were obtained to evaluate the vascular anatomy. Carotid stenosis measurements (when applicable) are obtained utilizing NASCET criteria, using the distal internal carotid diameter as the denominator. CONTRAST:  39mL OMNIPAQUE IOHEXOL 350 MG/ML SOLN COMPARISON:  Prior head CT from earlier the same day. FINDINGS: CTA NECK FINDINGS Aortic arch: Sized visualized aortic arch of normal caliber with normal 3 vessel morphology. Mild atheromatous change within the undersurface of the arch itself. No hemodynamically significant stenosis about the origin of the great vessels. Right carotid system: Right common and internal carotid arteries are mildly tortuous but are widely patent without stenosis, dissection or occlusion. Left carotid system: Left common and internal carotid arteries somewhat diffusely tortuous but are widely patent without stenosis, dissection or occlusion. Vertebral arteries: Both vertebral arteries arise from the subclavian arteries. Atheromatous plaque at the origin of the left vertebral artery with associated mild ostial stenosis. No proximal  subclavian artery stenosis. Vertebral arteries otherwise tortuous but widely patent within the neck without stenosis, dissection or occlusion. Skeleton: No visible acute osseous abnormality. Scoliosis with Harrington rod fixation partially visualized. No discrete or worrisome osseous lesions. Patient is edentulous. Other neck: No other acute soft tissue abnormality within the neck. 3.9 cm right thyroid nodule (series 5, image 132). No other mass or adenopathy. Upper chest: Visualized upper chest demonstrates no acute finding. Review of the MIP images confirms the above findings CTA HEAD FINDINGS Anterior circulation: Petrous segments widely patent bilaterally. Scattered atheromatous change within the carotid siphons bilaterally. No more than mild stenosis on the right. More mild to moderate stenosis noted about the cavernous left ICA. Right A1 widely patent. Left A1 hypoplastic, accounting for the diminutive left ICA is compared to the right. Normal anterior communicating artery complex. ACAs somewhat tortuous but are widely patent without stenosis. No M1 stenosis or occlusion. Negative MCA bifurcations. Distal MCA branches well perfused and symmetric. Posterior circulation: Both V4 segments widely patent to the vertebrobasilar junction without stenosis. Origin of the right PICA patent and normal. Left PICA origin not well seen. Basilar somewhat diffusely irregular but is widely patent to its distal aspect without significant stenosis. Superior cerebral arteries patent bilaterally. Both PCAs primarily supplied via the basilar well perfused to their distal aspects. Venous sinuses: Grossly patent allowing for timing the contrast bolus. Anatomic variants: Hypoplastic left A1 segment with the ACAs primarily supplied via the right carotid artery system. No aneurysm. Review of the MIP images confirms the above findings IMPRESSION: 1. Negative CTA for emergent large vessel occlusion. 2. Mild for age atheromatous change  about the carotid siphons with associated mild to moderate multifocal narrowing, left worse than right. No other hemodynamically significant or correctable stenosis about the major arterial vasculature of the head and neck. 3. Diffuse tortuosity about the major arterial vasculature of the head and neck, suggesting chronic underlying hypertension. 4. 3.9 cm right thyroid nodule, indeterminate. Further evaluation with dedicated thyroid ultrasound recommended for further evaluation. This could be performed on a nonemergent outpatient basis. (ref: J Am Coll Radiol. 2015 Feb;12(2): 143-50). These results were communicated to Dr. Theda Sers at 10:31 pmon 12/12/2021by text page via the [facility] messaging system. Electronically Signed   By: Jeannine Boga M.D.   On: 08/08/2020 22:41    EKG: Independently reviewed. NSR RBBB, LAFB. No acute changes1.  Assessment/Plan Active Problems:   CVA (cerebral vascular accident) ([place])   Dehydration   Acute encephalopathy   Schizoaffective disorder ([place])   UTI (urinary tract infection)   Essential hypertension  (please populate well all problems here in Problem List. (For example, if patient is on BP meds at home and you resume or decide to hold them, it is a problem that needs to be her. Same for CAD, COPD, HLD and so on)   1. Neuro - patient presents with altered mental status -obtunded with abnormal neuro exam. No CT evidence of stroke or LVO. MRI brain w/o contrast reveals acute right lentiform nucleus event with remote lacunar infarct in same distribution.  Plan Medical tele admit  TTE  PT/OT/SLP evaluation  NPO until ability to swallow established  2. Acute encephalopathy/UTI - per history patient has had similar presentations when suffering acute UTI. She presents afebrile with normal leukocyte count with normal differential. U/A pending. Patient also on multiple psychoactive drugs including paliperidone which was recently added and which causes behavioral  change in patient post injection per her son. Drugs may be a contributing factor.  Plan Rocephin 1 g IV q 24 until UTI ruled out.  Psychoactive drugs on hold while patient NPO  3. Schizoaffective disorder - long standing problem. She is on an extensive medication regimen.  Plan Hold medications while NPO  Haloperidol 2mg  IV prn agitation  Psychiatric consult regarding medications, interactions and possible adverse rxns.  4. Essential hypertension - continue home medications.  Plan As patient stabilizes may need to adjust regimen for better control  5. Dehydration - poor intake over past 48 hours. Patient with 2 d echo 03/20/19 revealing nl EF but impaired relaxation - mild HFpEF  Plan Gentle hydration 1/2 NS at 50 cc/hr  6. Code status confirmed with son - Full code  DVT prophylaxis: lovenox  Code Status: full code  Family Communication: spoke with Karl Bales, son. Explained results of diagnostic tests, except MRI which wasn't back, and treatment plan  Disposition Plan: Return to long term care facility when medically stable  Consults called: Dr. Theda Sers - neurology; please call psychiatry in AM  Admission status: inpatient - medical tele    Adella Hare MD [facility] Pager [phone]  If 7PM-7AM, please contact night-coverage [email or web address] Password [email or web address]  08/09/2020, 1:05 AM

## 2020-08-09 NOTE — Progress Notes (Signed)
MEDICATION RELATED CONSULT NOTE - FOLLOW UP   Pharmacy Consult to Review Medications for Drug Interactions  Allergies  Allergen Reactions  . Sulfonamide Derivatives Hives         Patient Measurements: Height: 5\' 5"  (165.1 cm) Weight: 67 kg (147 lb 11.3 oz) IBW/kg (Calculated) : 57  Vital Signs: Temp: 98.6 F (37 C) (12/13 0229) Temp Source: Axillary (12/13 0229) BP: 174/94 (12/13 1100) Pulse Rate: 103 (12/13 1100) Intake/Output from previous day: 12/12 0701 - 12/13 0700 In: -  Out: 1100 [Urine:1100] Intake/Output from this shift: No intake/output data recorded.  Labs: Recent Labs    08/08/20 2141 08/08/20 2149 08/08/20 2157 08/08/20 2236  WBC  --   --  8.9 8.7  HGB  --  14.3 12.7 12.7  HCT  --  42.0 41.7 41.7  PLT  --   --  225 224  APTT 25  --   --   --   CREATININE 0.95 0.80  --   --   ALBUMIN 3.8  --   --   --   PROT 7.2  --   --   --   AST 16  --   --   --   ALT 14  --   --   --   ALKPHOS 95  --   --   --   BILITOT 0.5  --   --   --    Estimated Creatinine Clearance: 59.7 mL/min (by C-G formula based on SCr of 0.8 mg/dL).   Medications:  Scheduled:  .  stroke: mapping our early stages of recovery book   Does not apply Once  . aspirin EC  325 mg Oral Daily   Or  . aspirin  300 mg Rectal Daily  . atorvastatin  40 mg Per Tube Daily  . benztropine  1 mg Per Tube BID  . enoxaparin (LOVENOX) injection  40 mg Subcutaneous QHS  . [START ON 08/10/2020] fluPHENAZine  20 mg Per Tube Daily  . LORazepam  0.5 mg Per Tube BID  . [START ON 08/10/2020] lurasidone  120 mg Per Tube Daily  . [START ON 08/10/2020] polyethylene glycol  17 g Per Tube Daily  . polyvinyl alcohol  1 drop Both Eyes TID    Assessment: 69 yo female presented on 08/08/2020 as a CODE STROKE found to have AIS. Patient has a PMH of schizoaffective disorder and is on paliperidone IM every 30 days (unknown last dose), lithium (level being obtained) lurasidone, fluphenazine. Patient is also on  other CNS acting medications including lorazepam (reduced from home dose), baclofen, benztropine, sertraline. Unable to provide history and family not present. No note available within [facility] or Care Everywhere.   Pharmacy consulted to assess drug interactions. Notable drug interactions listed below: - lithium + sertraline (major): increase risk of serotonin syndrome  - lithium + fluphenazine (major): increase risk of EPS - benztropine + fluphenazine (moderate): decrease concentration of fluphenazine and increase anticholinergic effects  - sertraline + fluphenazine (moderate): increase risk of developing acute parkinsonism  Considerations:  - Consider reducing dose of baclofen to 10mg  as needed instead of 20mg   - Could consider use of alternative long acting antipsychotic (with psych recommendations) given altered behavior began after paliperidone - Recommend consulting psychiatry for recommendations given complex history and multiple medications - Continue reduced dose lorazepam    Plan:  Follow up recommendations from psychiatry for recommendations for schizoaffective disorder given polypharmacy Follow up lithium levels   Cristela Felt, PharmD Clinical  Pharmacist  08/09/2020,11:14 AM

## 2020-08-09 NOTE — Progress Notes (Signed)
Brief Nutrition Note  Consult received for enteral/tube feeding initiation and management.  Adult Enteral Nutrition Protocol initiated. Full assessment to follow.  Admitting Dx: Facial droop [R29.810] CVA (cerebral vascular accident) ([place]) [I63.9] Cerebrovascular accident (CVA), unspecified mechanism ([place]) [I63.9]  Body mass index is 24.58 kg/m. Pt meets criteria for normal based on current BMI.  Labs:  Recent Labs  Lab 08/08/20 2141 08/08/20 2149  NA 137 141  K 3.9 3.9  CL 107 108  CO2 22  --   BUN 8 11  CREATININE 0.95 0.80  CALCIUM 10.5*  --   GLUCOSE 162* 134*    Heidi Derwin, MS, RD, LDN RD pager number and weekend/on-call pager number located in [place].

## 2020-08-09 NOTE — Hospital Course (Addendum)
69 year old woman nursing home resident PMH schizoaffective disorder, smoker, lacunar infarct, essential hypertension PRESENTED as code stroke altered mental status, confusion, possible left facial droop, seen by neurology and admitted for further evaluation and to exclude acute CNS process.  Found to have acute ischemic stroke.  A & P   Acute ischemic nonhemorrhage right lentiform nucleus infarct/CVA per MRI; with acute associated metabolic encephalopathy.  CTA negative for LVO. --EEG suggested moderate diffuse encephalopathy nonspecific, more suggestive of toxic metabolic causes but at times can be ictal-interictal continuum could consider prolonged study.  No seizures seen. --For stroke service aspirin 81 mg daily, Plavix 75 mg daily for 3 weeks then Plavix alone.  Continue statin. --Follow-up with neurology in 4 weeks  Dysphagia --Diet per speech therapy  Acute encephalopathy complicated by underlying schizoaffective disorder, presume secondary to acute CVA --Appears to be resolving or resolved.  Awake and alert.  U/a+, CXR NAD.  Lithium level was low. --On multiple psychotropics, see below  Pyuria --afebrile, normal WBC and diff.  No evidence of infection.  Antibiotics stopped.Marland Kitchen  Uncontrolled hypertension, possible hypertensive emergency SBP 220 --Remained stable.  Permissive hypertension  Schizoaffective disorder --Son reported altered behavior after beginning paliperidone IM every 30 days several months ago --Pharmacy recommended decreasing baclofen to 10 mg instead of 20, consider alternative paliperidone based on family input --Psychiatry consulted, discontinued fluphenazine, lurasidone.    Polypharmacy --plan as above  PMH lacunar infarct, cerebrovascular disease, cerebral atrophy, moderate chronic SVD.  Cigarette smoker --Recommend cessation  3.9 cm right thyroid nodule, indeterminate. Further evaluation with dedicated thyroid ultrasound recommended for  further evaluation. --Follow-up as an outpatient

## 2020-08-09 NOTE — Progress Notes (Signed)
  Echocardiogram 2D Echocardiogram has been performed.  Heidi Blevins 08/09/2020, 2:27 PM

## 2020-08-09 NOTE — Evaluation (Addendum)
Clinical/Bedside Swallow Evaluation Patient Details  Name: Heidi Blevins MRN: [id number] Date of Birth: 27-Mar-1951  Today's Date: 08/09/2020 Time: SLP Start Time (ACUTE ONLY): 1000 SLP Stop Time (ACUTE ONLY): 1008 SLP Time Calculation (min) (ACUTE ONLY): 8 min  Past Medical History:  Past Medical History:  Diagnosis Date   Anxiety    Arrhythmia    Bifascicular block    Depression    Hypertension    Insomnia    Schizoaffective disorder    Scoliosis    Vitamin D deficiency    Past Surgical History:  Past Surgical History:  Procedure Laterality Date   BACK SURGERY     "as a child after she fell out of a tree"   TONSILLECTOMY     TOTAL HIP ARTHROPLASTY Left 09/27/2018   Procedure: LEFT TOTAL HIP ARTHROPLASTY ANTERIOR APPROACH;  Surgeon: Leandrew Koyanagi, MD;  Location: [facility];  Service: Orthopedics;  Laterality: Left;   HPI:  Heidi Blevins is a 69 y.o. female with medical history significant of  AKI, UTI, schizoaffective disease here presenting altered mental status. Found to have 7 mm acute ischemic nonhemorrhagic right lentiform nucleus Infarct.  Patient is from [facility]. History, includes several previous presentations similar to this presentation due to UTIs. In prior admission in september/october pt struggled with PO intake due to lethargy. Was consuming puree and thin due to falling asleep with PO in her mouth.   Assessment / Plan / Recommendation Clinical Impression  Pt eyes are open but she is minimally responsive. Son at bedside reports pt often has a period of inability to eat or drink when ill, most recently a few days ago. Today she does not follow commands, does not respond with oral manipulation or labial seal to ice of water dripped from straw. Sucking reflex triggered with oral suction. Pt recommended to remain NPO for now and would benefit from a Cortrak today given importance of her psych medication per her son. If she does not  take this it will alter her mentation further and cause set backs. Reported to Dr. Sarajane Jews and will f/u for PO trials and SLE when appropriate SLP Visit Diagnosis: Dysphagia, unspecified (R13.10)    Aspiration Risk  Moderate aspiration risk;Risk for inadequate nutrition/hydration    Diet Recommendation NPO;Alternative means - temporary   Medication Administration: Via alternative means    Other  Recommendations Oral Care Recommendations: Oral care BID Other Recommendations: Have oral suction available   Follow up Recommendations Skilled Nursing facility      Frequency and Duration            Prognosis Prognosis for Safe Diet Advancement: Good      Swallow Study   General HPI: Heidi Blevins is a 69 y.o. female with medical history significant of  AKI, UTI, schizoaffective disease here presenting altered mental status.  Patient is from [facility] home. History, includes several previous presentations similar to this presentation due to UTIs. In prior admission in september/october pt struggled with PO intake due to lethargy. Was consuming puree and thin due to falling asleep with PO in her mouth. Type of Study: Bedside Swallow Evaluation Previous Swallow Assessment: see HPI Diet Prior to this Study: NPO Temperature Spikes Noted: No Respiratory Status: Room air History of Recent Intubation: No Behavior/Cognition: Distractible;Doesn't follow directions;Lethargic/Drowsy Oral Cavity Assessment: Other (comment) (pooled vomit around gums) Oral Care Completed by SLP: Yes Oral Cavity - Dentition: Edentulous Self-Feeding Abilities: Total assist Patient Positioning: Upright in bed  Baseline Vocal Quality: Not observed Volitional Cough: Cognitively unable to elicit Volitional Swallow: Unable to elicit    Oral/Motor/Sensory Function Overall Oral Motor/Sensory Function: Other (comment) (did not follow commands)   Ice Chips Ice chips: Impaired Presentation: Spoon Oral  Phase Impairments: Poor awareness of bolus Oral Phase Functional Implications: Right anterior spillage   Thin Liquid Thin Liquid: Impaired Presentation: Straw Oral Phase Impairments: Poor awareness of bolus Oral Phase Functional Implications: Oral residue;Prolonged oral transit;Right anterior spillage;Left anterior spillage    Nectar Thick Nectar Thick Liquid: Not tested   Honey Thick Honey Thick Liquid: Not tested   Puree Puree: Not tested   Solid     Solid: Not tested      Tiona Ruane, Katherene Ponto 08/09/2020,10:15 AM

## 2020-08-09 NOTE — Procedures (Signed)
Cortrak  Tube Type:  Cortrak - 43 inches Tube Location:  Left nare Initial Placement:  Stomach Secured by: Bridle Technique Used to Measure Tube Placement:  Documented cm marking at nare/ corner of mouth Cortrak Secured At:  65 cm    Cortrak Tube Team Note:  Consult received to place a Cortrak feeding tube.   X-ray is required, abdominal x-ray has been ordered by the Cortrak team. Please confirm tube placement before using the Cortrak tube.   If the tube becomes dislodged please keep the tube and contact the Cortrak team at [email or web address] (password [email or web address]) for replacement.  If after hours and replacement cannot be delayed, place a NG tube and confirm placement with an abdominal x-ray.    Koleen Distance MS, RD, LDN Please refer to [facility] for RD and/or RD on-call/weekend/after hours pager

## 2020-08-09 NOTE — ED Notes (Signed)
[name] [name] left & informed this RN that he will be returning later on now that he was updated from MD about pt status, his phone number has been confirmed to be correct in the chart.

## 2020-08-10 DIAGNOSIS — I6381 Other cerebral infarction due to occlusion or stenosis of small artery: Secondary | ICD-10-CM

## 2020-08-10 DIAGNOSIS — I63311 Cerebral infarction due to thrombosis of right middle cerebral artery: Secondary | ICD-10-CM

## 2020-08-10 DIAGNOSIS — R4182 Altered mental status, unspecified: Secondary | ICD-10-CM

## 2020-08-10 LAB — CBC
HCT: 37.7 % (ref 36.0–46.0)
Hemoglobin: 11.9 g/dL — ABNORMAL LOW (ref 12.0–15.0)
MCH: 30.4 pg (ref 26.0–34.0)
MCHC: 31.6 g/dL (ref 30.0–36.0)
MCV: 96.4 fL (ref 80.0–100.0)
Platelets: 260 10*3/uL (ref 150–400)
RBC: 3.91 MIL/uL (ref 3.87–5.11)
RDW: 12.7 % (ref 11.5–15.5)
WBC: 8.9 10*3/uL (ref 4.0–10.5)
nRBC: 0 % (ref 0.0–0.2)

## 2020-08-10 LAB — BASIC METABOLIC PANEL
Anion gap: 13 (ref 5–15)
BUN: 18 mg/dL (ref 8–23)
CO2: 22 mmol/L (ref 22–32)
Calcium: 11.1 mg/dL — ABNORMAL HIGH (ref 8.9–10.3)
Chloride: 111 mmol/L (ref 98–111)
Creatinine, Ser: 0.94 mg/dL (ref 0.44–1.00)
GFR, Estimated: >60 mL/min (ref >60)
Glucose, Bld: 108 mg/dL — ABNORMAL HIGH (ref 70–99)
Potassium: 4.1 mmol/L (ref 3.5–5.1)
Sodium: 146 mmol/L — ABNORMAL HIGH (ref 135–145)

## 2020-08-10 LAB — RAPID URINE DRUG SCREEN, HOSP PERFORMED
Amphetamines: NOT DETECTED
Barbiturates: NOT DETECTED
Benzodiazepines: POSITIVE — AB
Cocaine: NOT DETECTED
Opiates: NOT DETECTED
Tetrahydrocannabinol: NOT DETECTED

## 2020-08-10 LAB — GLUCOSE, CAPILLARY
Glucose-Capillary: 125 mg/dL — ABNORMAL HIGH (ref 70–99)
Glucose-Capillary: 112 mg/dL — ABNORMAL HIGH (ref 70–99)
Glucose-Capillary: 106 mg/dL — ABNORMAL HIGH (ref 70–99)
Glucose-Capillary: 99 mg/dL (ref 70–99)
Glucose-Capillary: 106 mg/dL — ABNORMAL HIGH (ref 70–99)

## 2020-08-10 LAB — PHOSPHORUS: Phosphorus: 2.7 mg/dL (ref 2.5–4.6)

## 2020-08-10 LAB — MAGNESIUM: Magnesium: 2.2 mg/dL (ref 1.7–2.4)

## 2020-08-10 LAB — TSH: TSH: 1.25 u[IU]/mL (ref 0.350–4.500)

## 2020-08-10 LAB — AMMONIA: Ammonia: 40 umol/L — ABNORMAL HIGH (ref 9–35)

## 2020-08-10 MED ORDER — ASPIRIN 325 MG PO TABS: 325.0000 mg | ORAL_TABLET | Freq: Every day | ORAL | Status: DC

## 2020-08-10 MED ORDER — FLUPHENAZINE HCL 5 MG PO TABS
20.0000 mg | ORAL_TABLET | Freq: Every day | ORAL | Status: DC
  Administered 2020-08-10: 12:00:00 20 mg via ORAL

## 2020-08-10 MED ORDER — NICOTINE 14 MG/24HR TD PT24
14.0000 mg | MEDICATED_PATCH | Freq: Every day | TRANSDERMAL | Status: DC
  Administered 2020-08-10 – 2020-08-13 (×4): 14 mg via TRANSDERMAL
  Filled 2020-08-10 (×4): qty 1

## 2020-08-10 MED ORDER — POLYETHYLENE GLYCOL 3350 17 G PO PACK
17.0000 g | PACK | Freq: Every day | ORAL | Status: DC
  Administered 2020-08-11 – 2020-08-13 (×3): 17 g via ORAL
  Filled 2020-08-10 (×3): qty 1

## 2020-08-10 MED ORDER — ASPIRIN 300 MG RE SUPP
300.0000 mg | Freq: Every day | RECTAL | Status: DC
  Administered 2020-08-10: 08:00:00 300 mg via RECTAL

## 2020-08-10 MED ORDER — LORAZEPAM 2 MG/ML IJ SOLN
1.0000 mg | Freq: Once | INTRAMUSCULAR | Status: AC | PRN
  Administered 2020-08-10: 1 mg via INTRAVENOUS
  Filled 2020-08-10: qty 1

## 2020-08-10 MED ORDER — ASPIRIN EC 81 MG PO TBEC
81.0000 mg | DELAYED_RELEASE_TABLET | Freq: Every day | ORAL | Status: DC
  Administered 2020-08-11 – 2020-08-13 (×3): 81 mg via ORAL
  Filled 2020-08-10 (×3): qty 1

## 2020-08-10 MED ORDER — ATORVASTATIN CALCIUM 40 MG PO TABS
40.0000 mg | ORAL_TABLET | Freq: Every day | ORAL | Status: DC
Start: 2020-08-10 — End: 2020-08-13
  Administered 2020-08-10 – 2020-08-12 (×3): 40 mg via ORAL
  Filled 2020-08-10 (×3): qty 1

## 2020-08-10 MED ORDER — LITHIUM CARBONATE 300 MG PO CAPS
300.0000 mg | ORAL_CAPSULE | Freq: Every morning | ORAL | Status: DC
  Administered 2020-08-10 – 2020-08-13 (×4): 300 mg via ORAL
  Filled 2020-08-10 (×5): qty 1

## 2020-08-10 MED ORDER — SERTRALINE HCL 100 MG PO TABS
100.0000 mg | ORAL_TABLET | Freq: Every day | ORAL | Status: DC
  Administered 2020-08-10 – 2020-08-13 (×4): 100 mg via ORAL
  Filled 2020-08-10 (×4): qty 1

## 2020-08-10 MED ORDER — LORAZEPAM 0.5 MG PO TABS: 0.5000 mg | ORAL_TABLET | Freq: Two times a day (BID) | ORAL | Status: DC

## 2020-08-10 MED ORDER — ASPIRIN 300 MG RE SUPP: 300.0000 mg | Freq: Every day | RECTAL | Status: DC

## 2020-08-10 MED ORDER — CLOPIDOGREL BISULFATE 75 MG PO TABS
75.0000 mg | ORAL_TABLET | Freq: Every day | ORAL | Status: DC
  Administered 2020-08-10 – 2020-08-13 (×4): 75 mg via ORAL
  Filled 2020-08-10 (×4): qty 1

## 2020-08-10 MED ORDER — LURASIDONE HCL 40 MG PO TABS
120.0000 mg | ORAL_TABLET | Freq: Every day | ORAL | Status: DC
  Administered 2020-08-10: 12:00:00 120 mg via ORAL

## 2020-08-10 MED ORDER — BENZTROPINE MESYLATE 1 MG PO TABS
1.0000 mg | ORAL_TABLET | Freq: Two times a day (BID) | ORAL | Status: DC
  Administered 2020-08-10 – 2020-08-13 (×7): 1 mg via ORAL
  Filled 2020-08-10 (×8): qty 1

## 2020-08-10 MED ORDER — ALUM & MAG HYDROXIDE-SIMETH 200-200-20 MG/5ML PO SUSP
30.0000 mL | Freq: Once | ORAL | Status: AC
  Administered 2020-08-10: 15:00:00 30 mL via ORAL
  Filled 2020-08-10: qty 30

## 2020-08-10 MED ORDER — LITHIUM CARBONATE 150 MG PO CAPS
150.0000 mg | ORAL_CAPSULE | Freq: Every day | ORAL | Status: DC
  Administered 2020-08-10 – 2020-08-12 (×3): 150 mg via ORAL
  Filled 2020-08-10 (×4): qty 1

## 2020-08-10 MED ORDER — ADULT MULTIVITAMIN W/MINERALS CH
1.0000 | ORAL_TABLET | Freq: Every day | ORAL | Status: DC
  Administered 2020-08-10 – 2020-08-13 (×4): 1 via ORAL
  Filled 2020-08-10 (×4): qty 1

## 2020-08-10 MED ORDER — LORAZEPAM 0.5 MG PO TABS
0.5000 mg | ORAL_TABLET | Freq: Three times a day (TID) | ORAL | Status: DC
  Administered 2020-08-10 – 2020-08-13 (×9): 0.5 mg via ORAL
  Filled 2020-08-10 (×10): qty 1

## 2020-08-10 MED ORDER — ENSURE ENLIVE PO LIQD
237.0000 mL | Freq: Three times a day (TID) | ORAL | Status: DC
  Administered 2020-08-10 – 2020-08-13 (×8): 237 mL via ORAL

## 2020-08-10 NOTE — Procedures (Signed)
Patient Name: Heidi Blevins  MRN: [id number]  Epilepsy Attending: Lora Havens  Referring Physician/Provider: Dr Rosalin Hawking Date: 08/09/2020  Duration: 23.14 mins  Patient history: 69 year old female with right small lentiform nucleus infarct and altered mental status.  EEG to evaluate for seizures.  Level of alertness: Awake  AEDs during EEG study: Lorazepam  Technical aspects: This EEG study was done with scalp electrodes positioned according to the 10-20 International system of electrode placement. Electrical activity was acquired at a sampling rate of 500Hz  and reviewed with a high frequency filter of 70Hz  and a low frequency filter of 1Hz . EEG data were recorded continuously and digitally stored.   Description: No clear posterior dominant rhythm was seen.  EEG showed continuous generalized 3 to 5 Hz theta-delta slowing.  Generalized periodic discharges with triphasic morphology at 1.5 to 2 Hz were also noted.  Hyperventilation and photic stimulation were not performed.     ABNORMALITY -Continuous slow, generalized -Periodic discharges with triphasic morphology, generalized  IMPRESSION: This study is suggestive of moderate diffuse encephalopathy, nonspecific etiology. The morphology, frequency of the periodic discharges is more suggestive of toxic-metabolic causes but at times can be on the ictal-interictal continuum and therefore if concern for interictal activity persists, consider prolonged study. No seizures or definite epileptiform discharges were seen throughout the recording.  Sibbie Flammia Barbra Sarks

## 2020-08-10 NOTE — Progress Notes (Signed)
Initial Nutrition Assessment  DOCUMENTATION CODES:   Not applicable  INTERVENTION:   Ensure Enlive/Plus po TID, each supplement provides 350 kcal and 20 grams of protein (Ensure Plus only provides 13 grams of protein per bottle)  MVI daily  Magic cup TID with meals, each supplement provides 290 kcal and 9 grams of protein    NUTRITION DIAGNOSIS:   Predicted suboptimal nutrient intake related to acute illness (CVA) as evidenced by other (comment) (per discussions with RN).   GOAL:   Patient will meet greater than or equal to 90% of their needs   MONITOR:   PO intake,Supplement acceptance,Diet advancement,Weight trends,Labs,I & O's  REASON FOR ASSESSMENT:   Consult Enteral/tube feeding initiation and management  ASSESSMENT:   Pt admitted with CVA. PMH includes AKI, UTI, schizoaffective disease. Pt is from [facility].  RD attempted to obtain diet/weight history from pt; however, pt unable to provide any meaningful history at this time.   Pt received Cortrak tube yesterday (gastric placement) and was initiated on TF protocol (Osmolite 1.2 cal @ 74ml/hr). Discussed pt with RN who states pt pulled out her Cortrak and was evaluated by SLP today and cleared for a Dysphagia 1 diet with thin liquids. Per RN, pt is tolerating diet so far. Will place order for oral nutrition supplements and discontinue any remaining TF orders given pt no longer has enteral access and pt has active diet order.  Reviewed weight history. No significant wt changes noted.   No UOP documented.   Labs: Na 146 (H) Medications: miralax  Diet Order:   Diet Order            DIET - DYS 1 Room service appropriate? Yes; Fluid consistency: Thin  Diet effective now                 EDUCATION NEEDS:   Not appropriate for education at this time  Skin:  Skin Assessment: Reviewed RN Assessment  Last BM:  PTA  Height:   Ht Readings from Last 1 Encounters:  08/08/20 5\' 5"  (1.651 m)     Weight:   Wt Readings from Last 1 Encounters:  08/10/20 65.3 kg    BMI:  Body mass index is 23.96 kg/m.  Estimated Nutritional Needs:   Kcal:  1600-1800  Protein:  80-95 grams  Fluid:  >1.6L/d    Larkin Ina, MS, RD, LDN RD pager number and weekend/on-call pager number located in [place].

## 2020-08-10 NOTE — Consult Note (Signed)
69 year old woman nursing home resident PMH schizoaffective disorder, smoker, lacunar infarct, essential hypertension PRESENTED as code stroke altered mental status, confusion, possible left facial droop, seen by neurology and admitted for further evaluation and to exclude acute CNS process.  Per documentation, son reports similar symptoms with previous UTIs.  Also reported alteration in her behavior after beginning paliperidone several months ago.  Found to have acute ischemic stroke, n.p.o., place temporary feeding tube for administration of psychiatric medications and tube feeds if needed.  Psych consult placed for schizoaffective disorder, new stroke, somnolence, complex psych polypharmacy, please advise as to when necessary meds and minimize psych meds as appropriate.  Patient is alert and oriented x3, calm and cooperative, and follows commands is directed.  Patient did state the room age, but was able to identify her year of birth correctly.  Patient does appear to be very knowledgeable about her ongoing psychiatric conditions and current medication regimen.  She is able to identify her current medications as Cogentin, Prolixin" My shot", lithium, and Ativan.  Patient further goes on to explain she takes Ativan 4 times a day as prescribed.  This Probation officer did reach out to pharmacy to confirm medications that patient is in fact receiving at nursing home, as there continues to be ongoing confusion regarding her medications.  Currently in the chart she is prescribed Zoloft 100 mg, Latuda 120 mg, lithium 300 mg p.o. every morning and lithium 150 mg p.o. nightly, Ativan 0.5 mg p.o. 3 times daily, Prolixin tablet 20 mg p.o. daily, Cogentin 1 mg p.o. twice daily.  Chart review also indicates patient has received several as needed medications to include haloperidol injection 2 mg every 6 hours as needed for agitation and Ativan injection 1 mg once as needed for sedation and agitation.  Patient is a 69 year old female  presented from a nursing home with AKI, UTI, schizoaffective disorder who presents with altered mental status.  She was subsequently diagnosed with right lentiform nucleus event with lacunar infarct. EEG shows mild diffuse encephalopathy, which can explain her altered mental status and ongoing confusion. Although she has a history of schizoaffective disorder she is currently stable, and taking medications as directed. I concur with polypharmacy and concerns about drug to drug interaction and adverse effects from multiple medications. See below for recommendations.    Recent lithium level obtained today is 0.26.  EEG obtained shows moderate diffuse encephalopathy.  Patient with incidental finding of a right thyroid nodule, TSH was ordered and determined to be within normal limits. There is no current documentation available from pharmacy, in regards to review of medications in her chart.  As patient appears to be knowledgeable about her current medication regimen will discontinue any additional medication in the chart at this time.  Patient with notable tongue protrusion, uncontrollable movements that are consistent with tardive dyskinesia.  She is currently being treated with Cogentin 1 mg p.o. twice daily, will monitor to see if this improves once discontinuing the additional antipsychotic medications listed.    -We will discontinue fluphenazine, as her last known dose for injection is not known.  Please continue to work closely with pharmacy to identify her last dose of fluphenazine as this is a monthly injection.  Also patient is stable at this time.  -We will also discontinue lurasidone as this is not a medication patient identified is currently taking at this time. -Patient with history of schizoaffective disorder appears to be stable, no additional recommendations at this time. -Patient is at risk for developing delirium  due to age and multiple risk factors, please initiate delirium precautions to  include possible telesitter at nighttime.  Also recommend reducing use of restraints as this can increase agitation and aggression in elderly patients. -Patient does not meet inpatient criteria. -Psychiatry to sign off at this time, please reconsult with any additional concerns or new information regarding her medication regimen at the nursing home.

## 2020-08-10 NOTE — Progress Notes (Signed)
Pt was less agitated and restless after 1 mg ativan given  Slept most of the night. .but up this am and trying to get OOBb and pull off tele leads. .Haldol 2 mg Iv given which was somehow effective,  MD notified for soft waist belt restraint order

## 2020-08-10 NOTE — Evaluation (Signed)
Speech Language Pathology Evaluation Patient Details Name: Heidi Blevins MRN: [id number] DOB: 1950-09-07 Today's Date: 08/10/2020 Time: 1000-1031 SLP Time Calculation (min) (ACUTE ONLY): 31 min  Problem List:  Patient Active Problem List   Diagnosis Date Noted  . Polypharmacy 08/09/2020  . Acute CVA (cerebrovascular accident) ([place]) 08/09/2020  . Thyroid nodule 08/09/2020  . CVA (cerebral vascular accident) ([place]) 08/08/2020  . Bradycardia   . Acute metabolic encephalopathy   . Sepsis secondary to UTI ([place]) 03/20/2019  . Closed displaced fracture of left femoral neck ([place]) 09/26/2018  . AKI (acute kidney injury) ([place]) 06/13/2016  . UTI (urinary tract infection) 06/13/2016  . Essential hypertension 06/13/2016  . Urinary tract infection without hematuria   . Complicated UTI (urinary tract infection) 05/03/2016  . Hypercalcemia 05/03/2016  . Dehydration 05/03/2016  . Acute encephalopathy 05/03/2016  . 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 Schizoaffective disorder ([place]) 05/03/2016  . Bifascicular block 05/03/2016   Past Medical History:  Past Medical History:  Diagnosis Date  . Anxiety   . Arrhythmia   . Bifascicular block   . Depression   . Hypertension   . Insomnia   . Schizoaffective disorder   . Scoliosis   . Thyroid nodule 08/09/2020  . Vitamin D deficiency    Past Surgical History:  Past Surgical History:  Procedure Laterality Date  . BACK SURGERY     "as a child after she fell out of a tree"  . TONSILLECTOMY    . TOTAL HIP ARTHROPLASTY Left 09/27/2018   Procedure: LEFT TOTAL HIP ARTHROPLASTY ANTERIOR APPROACH;  Surgeon: Leandrew Koyanagi, MD;  Location: [facility];  Service: Orthopedics;  Laterality: Left;   HPI:  Heidi Blevins is a 69 y.o. female with medical history significant of  AKI, UTI, schizoaffective disease here presenting altered mental status.  Patient is from [place] home. History, includes several previous presentations similar to  this presentation due to UTIs. In prior admission in september/october pt struggled with PO intake due to lethargy. Was consuming puree and thin due to falling asleep with PO in her mouth.   MRI 12/12: "7 mm acute ischemic nonhemorrhagic right lentiform nucleus  infarct."   Assessment / Plan / Recommendation Clinical Impression  Pt's speech and language were informally assesed today.  Pt performed confrontational naming task wiht 70% accuracy, increasing to 90% accuracy with semantic and phonemic cue.  Pt's repetition was essentially intact.  There was on minor word omission which was correct with repetition.  Pt initially demonstrated difficulty with Y/N questions, but this appears to be d/t difficulty with switching tasks rather than a true languge deficit.  Pt continued to repeat prompts and needed cuing to respond to questions.  Pt's accuracy with yes-no questions improved after 3-4 items.  Pt was able to follow 1 step command with 100% accuracy, 2 step commands with 67% accuracy, and completed 2 of 3 items for 3 step command.  Pt benefited from repetition of instructions.  Pt was noted to by dysarthric and her son endorses that this is a change from baseline.  Pt speaks at an appropriately slow rate.  She benefits from cues to speak loudly to improve intelligibility.  Reviewed compensatory strategies (SLOP) with pt and son.  [name] notes that she is much improved since admission to hospital and he is hopeful that continuation of her psychiatric medications will help support her improvement.  Pt had displaced cortrak overnight, but is now on an oral diet and will be able  to resume PO medication.  If pt's cognition has not returned to baseline, pt may benefit from further assessment of higher level cognitive-linguistic function.  SLP will follow pt in house to address dysarthria and pt would benefit from continued ST at discharge.    SLP Assessment  SLP Recommendation/Assessment: Patient needs continued  Speech Lanaguage Pathology Services SLP Visit Diagnosis: Dysarthria and anarthria (R47.1)    Follow Up Recommendations   (Continue ST at next level of care)    Frequency and Duration min 2x/week  2 weeks      SLP Evaluation Cognition  Overall Cognitive Status: Impaired/Different from baseline Orientation Level: Oriented to person;Disoriented to situation;Disoriented to time;Disoriented to place       Comprehension  Auditory Comprehension Overall Auditory Comprehension: Impaired Yes/No Questions: Impaired Basic Biographical Questions: 51-75% accurate Basic Immediate Environment Questions: 50-74% accurate Complex Questions: 75-100% accurate Commands: Impaired One Step Basic Commands: 75-100% accurate Two Step Basic Commands: 50-74% accurate Multistep Basic Commands: 25-49% accurate EffectiveTechniques: Repetition    Expression Expression Primary Mode of Expression: Verbal Verbal Expression Overall Verbal Expression: Impaired Repetition: No impairment Naming: Impairment Confrontation: Impaired Effective Techniques: Semantic cues;Phonemic cues   Oral / Motor  Oral Motor/Sensory Function Overall Oral Motor/Sensory Function: Mild impairment Facial ROM: Reduced left (Slight) Facial Symmetry: Abnormal symmetry left Lingual ROM: Within Functional Limits Lingual Symmetry: Within Functional Limits Lingual Strength: Reduced Velum: Within Functional Limits Mandible: Within Functional Limits Motor Speech Overall Motor Speech: Impaired Phonation: Low vocal intensity Articulation: Impaired Level of Impairment: Word Intelligibility: Intelligibility reduced Word: 75-100% accurate Effective Techniques: Increased vocal intensity   GO                    Celedonio Savage, MA, [place] Office: [phone]; Pager (12/14): [phone] 08/10/2020, 11:01 AM

## 2020-08-10 NOTE — Progress Notes (Signed)
Patient has been following commands, not attempting to remove equipment and able to reorient. MD made aware and we will remove restraints and discontinue safety attendant.   Patient co of new chest pain. VS in chart BP elevated 184/98 (122), Stat EKG completed and placed in chart. Sitter states patient had a choking spell previous to chest pain. MD at bedside to evaluate. New orders placed and patient no longer co of chest pain. RN will continue to monitor.

## 2020-08-10 NOTE — Progress Notes (Signed)
PROGRESS NOTE  Heidi Blevins ZOX:[id number] DOB: [birth year] DOA: 08/08/2020 PCP: [name], MD  Brief History   69 year old woman nursing home resident PMH schizoaffective disorder, smoker, lacunar infarct, essential hypertension PRESENTED as code stroke altered mental status, confusion, possible left facial droop, seen by neurology and admitted for further evaluation and to exclude acute CNS process.  Found to have acute ischemic stroke.  A & P   Acute ischemic nonhemorrhage right lentiform nucleus infarct/CVA per MRI; with acute associated metabolic encephalopathy.  CTA negative for LVO. --EEG suggested moderate diffuse encephalopathy nonspecific, more suggestive of toxic metabolic causes but at times can be ictal-interictal continuum could consider prolonged study.  No seizures seen. --For stroke service aspirin 81 mg daily, Plavix 75 mg daily for 3 weeks then Plavix alone.  Continue statin. --Follow-up with neurology in 4 weeks  Dysphagia --Diet per speech therapy  Acute encephalopathy complicated by underlying schizoaffective disorder, presume secondary to acute CVA --Appears to be resolving or resolved.  Awake and alert.  U/a+, CXR NAD.  Lithium level was low. --On multiple psychotropics, see below  Pyuria --afebrile, normal WBC and diff.  No evidence of infection.  Antibiotics stopped.Marland Kitchen  Uncontrolled hypertension, possible hypertensive emergency SBP 220 --Remained stable.  Permissive hypertension  Schizoaffective disorder --Son reported altered behavior after beginning paliperidone IM every 30 days several months ago --Pharmacy recommended decreasing baclofen to 10 mg instead of 20, consider alternative paliperidone based on family input --Psychiatry consulted, discontinued fluphenazine, lurasidone.    Polypharmacy --plan as above  PMH lacunar infarct, cerebrovascular disease, cerebral atrophy, moderate chronic SVD.  Cigarette smoker --Recommend cessation  3.9  cm right thyroid nodule, indeterminate. Further evaluation with dedicated thyroid ultrasound recommended for further evaluation. --Follow-up as an outpatient     Disposition Plan:  Discussion: Overall improved.  Recommendations per neurology as above.  Further clarification pending from psychiatry.  Await therapy evaluations.  Status is: Inpatient  Remains inpatient appropriate because:Altered mental status, IV treatments appropriate due to intensity of illness or inability to take PO and Inpatient level of care appropriate due to severity of illness   Dispo: The patient is from: SNF              Anticipated d/c is to: SNF              Anticipated d/c date is: 1 day              Patient currently is not medically stable to d/c.  DVT prophylaxis: enoxaparin (LOVENOX) injection 40 mg Start: 08/09/20 0000   Code Status: Full Code Family Communication: son Reece Agar at bedside  Murray Hodgkins, MD  [facility] Direct contact: see [email or web address] (further directions at bottom of note if needed) 7PM-7AM contact night coverage as at bottom of note 08/10/2020, 5:09 PM  LOS: 2 days   Significant Hospital Events   .    Consults:  . Neurology    Procedures:  .   Significant Diagnostic Tests:  Marland Kitchen    Micro Data:  .    Antimicrobials:  .   Interval History/Subjective  CC: f/u stroke  Became agitated last night and pulled feeding tube.  Much better today per nursing in fact has been stable, not trying to get out of bed, participating well in tolerating diet.  Had an episode of chest pain earlier  Objective   Vitals:  Vitals:   08/10/20 1541 08/10/20 1611  BP: (!) 164/92 (!) 180/53  Pulse: 72 61  Resp: 18 16  Temp: 98.2 F (36.8 C) (!) 97.5 F (36.4 C)  SpO2: 99% 98%    Exam: Constitutional:   . Appears calm and comfortable ENMT:  . grossly normal hearing  Respiratory:  . CTA bilaterally, no w/r/r.  . Respiratory effort normal.  Cardiovascular:  . RRR, no  m/r/g . No LE extremity edema   Abdomen:  . soft Musculoskeletal:  . RUE, LUE, RLE, LLE   Moves all extremities to command Psychiatric:  . Mental status o Mood, affect appropriate for condition . Oriented to self  I have personally reviewed the following:   Today's Data  . CBG remained stable . BMP unremarkable . Magnesium and phosphorus within normal limits . CBC unremarkable . Lithium level was low and TSH unremarkable . EKG sinus rhythm, right bundle branch block, no acute changes seen  Scheduled Meds: .  stroke: mapping our early stages of recovery book   Does not apply Once  . [START ON 08/11/2020] aspirin EC  81 mg Oral Daily  . atorvastatin  40 mg Oral Daily  . benztropine  1 mg Oral BID  . clopidogrel  75 mg Oral Daily  . enoxaparin (LOVENOX) injection  40 mg Subcutaneous QHS  . feeding supplement  237 mL Oral TID BM  . lithium carbonate  150 mg Oral QHS  . lithium carbonate  300 mg Oral q AM  . LORazepam  0.5 mg Oral TID  . multivitamin with minerals  1 tablet Oral Daily  . nicotine  14 mg Transdermal Daily  . [START ON 08/11/2020] polyethylene glycol  17 g Oral Daily  . polyvinyl alcohol  1 drop Both Eyes TID  . sertraline  100 mg Oral Daily   Continuous Infusions:   Principal Problem:   Acute CVA (cerebrovascular accident) ([place]) Active Problems:   Dehydration   Acute encephalopathy   Schizoaffective disorder ([place])   UTI (urinary tract infection)   Essential hypertension   CVA (cerebral vascular accident) ([place])   Polypharmacy   Thyroid nodule   LOS: 2 days   How to contact the [facility] Attending or Consulting provider [place] or covering provider during after hours [place], for this patient?  1. Check the care team in [facility] and look for a) attending/consulting TR[facility] provider listed and b) the [facility] team listed 2. Log into [email or web address] and use [facility]'s universal password to access. If you do not have the password, please contact the hospital  operator. 3. Locate the [facility] provider you are looking for under [facility] and page to a number that you can be directly reached. 4. If you still have difficulty reaching the provider, please page the [facility] (Director on Call) for the Hospitalists listed on amion for assistance.

## 2020-08-10 NOTE — Progress Notes (Signed)
  Speech Language Pathology Treatment: Dysphagia  Patient Details Name: Heidi Blevins MRN: [id number] DOB: [birth year] Today's Date: 08/10/2020 Time: 0881-1031 SLP Time Calculation (min) (ACUTE ONLY): 12 min  Assessment / Plan / Recommendation Clinical Impression  Today pt was awake and alert and able to participate in swallowing assessment. Pt was reassessed clinically for readiness for PO intake.  Pt appears to have slight residual L facial droop.  Pt exhibited good lingual range of motion and adequate lingual strength during OME.  Pt was unable to masticate regular or soft solid cracker.  With simulated ground consistency there was mild L sided pocketing.  Pt was unable to clear with cued lingual sweep.  Liquid wash was not initially beneficial.  With additional puree bolus and liquids was, residue was eventually cleared.  There were no clinical s/s of aspiration with any consistencies trialed including large serial straw sips of thin liquid.  Recommend puree diet with thin liquids.  Administer medications as tolerated.   HPI HPI: Heidi Blevins is a 69 y.o. female with medical history significant of  AKI, UTI, schizoaffective disease here presenting altered mental status.  Patient is from [employer] home. History, includes several previous presentations similar to this presentation due to UTIs. In prior admission in september/october pt struggled with PO intake due to lethargy. Was consuming puree and thin due to falling asleep with PO in her mouth.   MRI 12/12: "7 mm acute ischemic nonhemorrhagic right lentiform nucleus  infarct."      SLP Plan  Continue with current plan of care       Recommendations  Diet recommendations: Dysphagia 1 (puree);Thin liquid Liquids provided via: Cup;Straw Medication Administration: Whole meds with liquid Supervision: Trained caregiver to feed patient Compensations: Slow rate;Small sips/bites;Follow solids with liquid;Minimize  environmental distractions Postural Changes and/or Swallow Maneuvers: Seated upright 90 degrees                Oral Care Recommendations: Oral care BID Follow up Recommendations:  (Continue ST at next level of care) SLP Visit Diagnosis: Dysphagia, unspecified (R13.10);Dysphagia, oral phase (R13.11) Plan: Continue with current plan of care       [place] , [place], [place] Office: [phone]; Pager (12/14): [phone] 08/10/2020, 10:37 AM

## 2020-08-10 NOTE — Plan of Care (Signed)
  Problem: Safety: Goal: Ability to remain free from injury will improve Outcome: Progressing   Problem: Skin Integrity: Goal: Risk for impaired skin integrity will decrease Outcome: Progressing   

## 2020-08-10 NOTE — Progress Notes (Signed)
STROKE TEAM PROGRESS NOTE   INTERVAL HISTORY Her son is at the bedside.  Patient lying in bed, much improved from yesterday, still has moderate dysarthria, but orientated to place, people and month. Said her age wrong, and not orientated to year. Left UE mild drift only. Very pleasant and smiling but asking for her pain pills.   Vitals:   08/09/20 1954 08/09/20 2311 08/10/20 0332 08/10/20 0816  BP: (!) 168/88 (!) 169/97 (!) 157/86 (!) 173/90  Pulse: 99 94 77 89  Resp: 19 20 20 18   Temp: 98.8 F (37.1 C) 98.4 F (36.9 C) 98.5 F (36.9 C) 98 F (36.7 C)  TempSrc: Axillary Axillary Axillary Oral  SpO2: 98% 97% 98% 98%  Weight:   65.3 kg   Height:       CBC:  Recent Labs  Lab 08/08/20 2157 08/08/20 2236  WBC 8.9 8.7  NEUTROABS  --  6.8  HGB 12.7 12.7  HCT 41.7 41.7  MCV 102.2* 102.5*  PLT 225 737   Basic Metabolic Panel:  Recent Labs  Lab 08/08/20 2141 08/08/20 2149  NA 137 141  K 3.9 3.9  CL 107 108  CO2 22  --   GLUCOSE 162* 134*  BUN 8 11  CREATININE 0.95 0.80  CALCIUM 10.5*  --    Lipid Panel:  Recent Labs  Lab 08/09/20 0805  CHOL 191  TRIG 55  HDL 60  CHOLHDL 3.2  VLDL 11  LDLCALC 120*   HgbA1c:  Recent Labs  Lab 08/09/20 0805  HGBA1C 5.3   Urine Drug Screen:  Recent Labs  Lab 08/10/20 0818  LABOPIA NONE DETECTED  COCAINSCRNUR NONE DETECTED  LABBENZ POSITIVE*  AMPHETMU NONE DETECTED  THCU NONE DETECTED  LABBARB NONE DETECTED    Alcohol Level No results for input(s): ETH in the last 168 hours.  IMAGING past 24 hours DG Abd Portable 1V  Result Date: 08/09/2020 CLINICAL DATA:  Enteric tube placement EXAM: PORTABLE ABDOMEN - 1 VIEW COMPARISON:  03/19/2019 FINDINGS: Single AP supine radiograph of the lower chest and upper abdomen was performed for the purposes of enteric tube localization. A large bore enteric tube courses below the diaphragm with distal tip terminating within the expected location of the distal stomach. Thoracolumbar  scoliosis with single fixation rod. IMPRESSION: Enteric tube tip terminating within the distal stomach. Electronically Signed   By: Davina Poke D.O.   On: 08/09/2020 15:18   EEG adult  Result Date: 08/10/2020 Lora Havens, MD     08/10/2020  9:09 AM Patient Name: Heidi Blevins MRN: [id number] Epilepsy Attending: Lora Havens Referring Physician/Provider: Dr Rosalin Hawking Date: 08/09/2020 Duration: 23.14 mins Patient history: 69 year old female with right small lentiform nucleus infarct and altered mental status.  EEG to evaluate for seizures. Level of alertness: Awake AEDs during EEG study: Lorazepam Technical aspects: This EEG study was done with scalp electrodes positioned according to the 10-20 International system of electrode placement. Electrical activity was acquired at a sampling rate of 500Hz  and reviewed with a high frequency filter of 70Hz  and a low frequency filter of 1Hz . EEG data were recorded continuously and digitally stored. Description: No clear posterior dominant rhythm was seen.  EEG showed continuous generalized 3 to 5 Hz theta-delta slowing.  Generalized periodic discharges with triphasic morphology at 1.5 to 2 Hz were also noted.  Hyperventilation and photic stimulation were not performed.   ABNORMALITY -Continuous slow, generalized -Periodic discharges with triphasic morphology, generalized IMPRESSION: This study is suggestive  of moderate diffuse encephalopathy, nonspecific etiology. The morphology, frequency of the periodic discharges is more suggestive of toxic-metabolic causes but at times can be on the ictal-interictal continuum and therefore if concern for interictal activity persists, consider prolonged study. No seizures or definite epileptiform discharges were seen throughout the recording. Lora Havens   ECHOCARDIOGRAM COMPLETE  Result Date: 08/09/2020    ECHOCARDIOGRAM REPORT   Patient Name:   Heidi Blevins Date of Exam: 08/09/2020 Medical Rec #:   [id number]           Height:       65.0 in Accession #:    [id number]          Weight:       147.7 lb Date of Birth:  [birth year]            BSA:          1.739 m Patient Age:    83 years            BP:           170/98 mmHg Patient Gender: F                   HR:           100 bpm. Exam Location:  Inpatient Procedure: 2D Echo, Cardiac Doppler and Color Doppler Indications:    Stroke 434.91 / I163.9  History:        Patient has prior history of Echocardiogram examinations, most                 recent 03/20/2019. Risk Factors:Hypertension. Arrhythmia.  Sonographer:    Jonelle Sidle Dance Referring Phys: [place]  1. Left ventricular ejection fraction, by estimation, is 65 to 70%. The left ventricle has normal function. The left ventricle has no regional wall motion abnormalities. There is mild left ventricular hypertrophy. Left ventricular diastolic parameters are consistent with Grade I diastolic dysfunction (impaired relaxation).  2. Right ventricular systolic function is normal. The right ventricular size is normal. Tricuspid regurgitation signal is inadequate for assessing PA pressure.  3. Left atrial size was mildly dilated.  4. A small pericardial effusion is present.  5. The mitral valve is normal in structure. No evidence of mitral valve regurgitation. No evidence of mitral stenosis.  6. The aortic valve is tricuspid. Aortic valve regurgitation is not visualized. No aortic stenosis is present.  7. The inferior vena cava is normal in size with greater than 50% respiratory variability, suggesting right atrial pressure of 3 mmHg. FINDINGS  Left Ventricle: Left ventricular ejection fraction, by estimation, is 65 to 70%. The left ventricle has normal function. The left ventricle has no regional wall motion abnormalities. The left ventricular internal cavity size was normal in size. There is  mild left ventricular hypertrophy. Left ventricular diastolic parameters are consistent with Grade I  diastolic dysfunction (impaired relaxation). Right Ventricle: The right ventricular size is normal. No increase in right ventricular wall thickness. Right ventricular systolic function is normal. Tricuspid regurgitation signal is inadequate for assessing PA pressure. Left Atrium: Left atrial size was mildly dilated. Right Atrium: Right atrial size was normal in size. Pericardium: A small pericardial effusion is present. Mitral Valve: The mitral valve is normal in structure. No evidence of mitral valve regurgitation. No evidence of mitral valve stenosis. Tricuspid Valve: The tricuspid valve is normal in structure. Tricuspid valve regurgitation is not demonstrated. Aortic Valve: The aortic valve is tricuspid. Aortic valve regurgitation is  not visualized. No aortic stenosis is present. Pulmonic Valve: The pulmonic valve was normal in structure. Pulmonic valve regurgitation is not visualized. Aorta: The aortic root is normal in size and structure. Venous: The inferior vena cava is normal in size with greater than 50% respiratory variability, suggesting right atrial pressure of 3 mmHg. IAS/Shunts: No atrial level shunt detected by color flow Doppler.  LEFT VENTRICLE PLAX 2D LVIDd:         4.20 cm  Diastology LVIDs:         3.00 cm  LV e' medial:  8.27 cm/s LV PW:         1.10 cm  LV e' lateral: 8.59 cm/s LV IVS:        1.30 cm LVOT diam:     1.90 cm LV SV:         55 LV SV Index:   32 LVOT Area:     2.84 cm  RIGHT VENTRICLE             IVC RV Basal diam:  2.50 cm     IVC diam: 2.00 cm RV S prime:     15.90 cm/s TAPSE (M-mode): 2.6 cm LEFT ATRIUM             Index       RIGHT ATRIUM           Index LA diam:        3.80 cm 2.19 cm/m  RA Area:     11.80 cm LA Vol (A2C):   61.1 ml 35.13 ml/m RA Volume:   26.10 ml  15.01 ml/m LA Vol (A4C):   45.7 ml 26.28 ml/m LA Biplane Vol: 54.6 ml 31.40 ml/m  AORTIC VALVE LVOT Vmax:   118.00 cm/s LVOT Vmean:  76.000 cm/s LVOT VTI:    0.194 m  AORTA Ao Root diam: 3.50 cm MV A  velocity: 132.50 cm/s                             SHUNTS                             Systemic VTI:  0.19 m                             Systemic Diam: 1.90 cm Loralie Champagne MD Electronically signed by Loralie Champagne MD Signature Date/Time: 08/09/2020/3:50:05 PM    Final     PHYSICAL EXAM  Temp:  [98 F (36.7 C)-100.2 F (37.9 C)] 98 F (36.7 C) (12/14 0816) Pulse Rate:  [77-116] 89 (12/14 0816) Resp:  [18-36] 18 (12/14 0816) BP: (152-177)/(86-105) 173/90 (12/14 0816) SpO2:  [95 %-99 %] 98 % (12/14 0816) Weight:  [65.3 kg] 65.3 kg (12/14 0332)  General - Well nourished, well developed, in no apparent distress.  Ophthalmologic - fundi not visualized due to noncooperation.  Cardiovascular - Regular rhythm and rate.  Neuro - awake alert and eyes open, moderate dysarthria but orientated to place and people and month, but not to year or age. Able to name 3/3 and repeat simple sentences, following all simple commands. Spontaneous speech although paucity of speech. Tracking bilaterally, no gaze palsy.  Blinking to visual threat bilaterally.  PERRL. Facial symmetrical.  Tongue protrusion midline.  Right upper extremity no drift 4/5, left upper extremity mild pronator drift.  Right  lower extremity 4/5 and left LE 4-/5. Sensation symmetrical, right FTN intact, left FTN slight dysmetria and gait not tested.   ASSESSMENT/PLAN Ms. JKAYLA SPIEWAK is a 69 y.o. female with history of AKI, UTI, schizoaffective disease  presenting from SNF with altered mental status and L facial droop. Vomited in the field and BP was 220/110.  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 Small vessel disease. Atrophy. 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 ASPECTS 10.     CTA head & neck no LVO. Mild B cavernous ICA atherosclerosis L>R. Tortuosity of head and neck. R thyroid nodule.    MRI  R lentiform nucleus infarct. Small vessel disease. Atrophy. 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  2D Echo EF 65-70%. No source of embolus   LDL 120  HgbA1c 5.3  VTE prophylaxis - Lovenox 40 mg sq daily   aspirin 81 mg daily prior to admission, now on aspirin 81 and plavix 75 DAPT for 3 weeks and then plavix alone.   Therapy recommendations:  pending   Disposition:  pending  (from SNF)  Encephalopathy  UTI  Patient mental status change out of proportion to the size of stroke  UA WBC 21-50  Urine culture neg  Ammonia level 40  On Rocephin  EEG moderate diffuse encephalopathy, no seizure  Hypertensive Urgency  BP 220/110 on admission   Stable on the high end  Gradually normalize BP in 2 to 3 days . Long-term BP goal normotensive  Hyperlipidemia  Home meds:  No statin  Now on lipitor 40  LDL 120, goal < 70  Continue statin at discharge  Dysphagia . Secondary to stroke . NPO -> now passed swallow . On dys 1 thin liquid now . Speech on board  Tobacco abuse  Current smoker  Smoking cessation counseling will be provided   Other Stroke Risk Factors  Advanced Age >/= 23   Family hx stroke (mother)  Other Active Problems  Schizoaffective d/o on lithium -lithium level low  Polypharmacy  R thyroid nodule  Hospital day # 2  Neurology will sign off. Please call with questions. Pt will follow up with stroke clinic NP at [facility] in about 4 weeks. Thanks for the consult.  Rosalin Hawking, MD PhD Stroke Neurology 08/10/2020 2:58 PM    To contact Stroke Continuity provider, please refer to [email or web address] After hours, contact General Neurology

## 2020-08-11 ENCOUNTER — Inpatient Hospital Stay (HOSPITAL_COMMUNITY): Payer: Medicare Other

## 2020-08-11 LAB — CBC
HCT: 37.6 % (ref 36.0–46.0)
Hemoglobin: 12 g/dL (ref 12.0–15.0)
MCH: 31.1 pg (ref 26.0–34.0)
MCHC: 31.9 g/dL (ref 30.0–36.0)
MCV: 97.4 fL (ref 80.0–100.0)
Platelets: 260 10*3/uL (ref 150–400)
RBC: 3.86 MIL/uL — ABNORMAL LOW (ref 3.87–5.11)
RDW: 12.6 % (ref 11.5–15.5)
WBC: 8.2 10*3/uL (ref 4.0–10.5)
nRBC: 0 % (ref 0.0–0.2)

## 2020-08-11 LAB — GLUCOSE, CAPILLARY
Glucose-Capillary: 118 mg/dL — ABNORMAL HIGH (ref 70–99)
Glucose-Capillary: 125 mg/dL — ABNORMAL HIGH (ref 70–99)
Glucose-Capillary: 125 mg/dL — ABNORMAL HIGH (ref 70–99)
Glucose-Capillary: 128 mg/dL — ABNORMAL HIGH (ref 70–99)
Glucose-Capillary: 145 mg/dL — ABNORMAL HIGH (ref 70–99)
Glucose-Capillary: 90 mg/dL (ref 70–99)

## 2020-08-11 LAB — BASIC METABOLIC PANEL
Anion gap: 8 (ref 5–15)
BUN: 16 mg/dL (ref 8–23)
CO2: 24 mmol/L (ref 22–32)
Calcium: 10.8 mg/dL — ABNORMAL HIGH (ref 8.9–10.3)
Chloride: 111 mmol/L (ref 98–111)
Creatinine, Ser: 0.94 mg/dL (ref 0.44–1.00)
GFR, Estimated: >60 mL/min (ref >60)
Glucose, Bld: 100 mg/dL — ABNORMAL HIGH (ref 70–99)
Potassium: 4.6 mmol/L (ref 3.5–5.1)
Sodium: 143 mmol/L (ref 135–145)

## 2020-08-11 MED ORDER — SENNOSIDES-DOCUSATE SODIUM 8.6-50 MG PO TABS
1.0000 | ORAL_TABLET | Freq: Two times a day (BID) | ORAL | Status: DC
  Administered 2020-08-11 – 2020-08-13 (×5): 1 via ORAL
  Filled 2020-08-11 (×5): qty 1

## 2020-08-11 MED ORDER — ACETAMINOPHEN 325 MG PO TABS
650.0000 mg | ORAL_TABLET | Freq: Once | ORAL | Status: AC
  Filled 2020-08-11: qty 2

## 2020-08-11 MED ORDER — SIMETHICONE 80 MG PO CHEW
80.0000 mg | CHEWABLE_TABLET | Freq: Four times a day (QID) | ORAL | Status: DC
  Administered 2020-08-11 – 2020-08-13 (×8): 80 mg via ORAL
  Filled 2020-08-11 (×11): qty 1

## 2020-08-11 NOTE — Evaluation (Signed)
Occupational Therapy Evaluation Patient Details Name: Heidi Blevins MRN: [id number] DOB: Nov 21, 1950 Today's Date: 08/11/2020    History of Present Illness Pt is a 69 year old female with medical hx of AKI, thyroid nodule, insomnia, depression, bifascicular block, arrhythmia, scoliosis, UTI, schizoaffective disorder who presented from [place] home with altered mental status and L side facial droop. 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   Clinical Impression   PTA patient was residing at an ALF and was grossly supervision for ADLs/ADL transfers and functional mobility without use of AD. Patient currently presents with deficits including decreased cognition, expressive and mild receptive aphasia, difficulty sequencing familiar tasks, decreased static/dynamic standing balance, and need for overall Min A for ADLs/ADL transfers with use of RW. Patient also demonstrates L-sided hemiparesis LUE>LLE and L-sided paraesthesia. Patient would benefit from continued acute OT services to maximize safety and independence with self-care tasks in prep for safe d/c to prior level of living. Recommendation for return to ALF with HHOT and 24hr supervision/assist.     Follow Up Recommendations  Home health OT;Supervision/Assistance - 24 hour    Equipment Recommendations  3 in 1 bedside commode    Recommendations for Other Services       Precautions / Restrictions Precautions Precautions: Fall Restrictions Weight Bearing Restrictions: No      Mobility Bed Mobility Overal bed mobility: Needs Assistance Bed Mobility: Supine to Sit     Supine to sit: Min assist     General bed mobility comments: Min gurard with mod multimodal cues for sequencing.    Transfers Overall transfer level: Needs assistance Equipment used: Rolling walker (2 wheeled) Transfers: Sit to/from  [facility] Sit to Stand: Min guard;Min assist Stand pivot transfers: Min assist       General transfer comment: Min guard for sit to stand from elevated surfaces and Min A for low surfaces with cues for hand placement. Min A for stand-pivot transfers with multimodal cues for walker management and proximity.    Balance Overall balance assessment: Needs assistance Sitting-balance support: Bilateral upper extremity supported;Feet supported Sitting balance-Leahy Scale: Fair Sitting balance - Comments: Patient able to maintain static sitting balance at EOB without external assist.   Standing balance support: Bilateral upper extremity supported;During functional activity Standing balance-Leahy Scale: Fair Standing balance comment: Patient able to maintain static standing balance at sink during grooming tasks with hip braced on sink and no UE support.                           ADL either performed or assessed with clinical judgement   ADL Overall ADL's : Needs assistance/impaired Eating/Feeding: Set up;Sitting   Grooming: Standing;Min guard Grooming Details (indicate cue type and reason): 2/3 grooming tasks standing at sink level with Mod multimodal cues for sequencing.             Lower Body Dressing: Minimal assistance;Sit to/from stand Lower Body Dressing Details (indicate cue type and reason): Min A to maintain standing balance. Toilet Transfer: Minimal Insurance claims handler Details (indicate cue type and reason): Simulated with transfer to recliner. Cues for proximity and hand placement.         Functional mobility during ADLs: Min guard General ADL Comments: Min guard with use of RW with multimodal cues for walker management.     Vision Patient Visual Report: No change from baseline  Vision Assessment?: No apparent visual deficits Additional Comments: Additional eye shifts with assessment of pursuits likely 2/2 receptive aphasia.      Perception Perception Perception Tested?: Yes Comments: Appears WNL   Praxis Praxis Praxis tested?: Within functional limits    Pertinent Vitals/Pain Pain Assessment: 0-10 Pain Score: 5  Pain Location: abdomen and headache Pain Descriptors / Indicators: Grimacing;Discomfort Pain Intervention(s): Limited activity within patient's tolerance;Monitored during session;Patient requesting pain meds-RN notified     Hand Dominance Right   Extremity/Trunk Assessment Upper Extremity Assessment Upper Extremity Assessment: LUE deficits/detail LUE Deficits / Details: Brunnstrom stage 5 LUE Sensation: decreased light touch;decreased proprioception LUE Coordination: decreased fine motor;decreased gross motor   Lower Extremity Assessment Lower Extremity Assessment: Defer to PT evaluation   Cervical / Trunk Assessment Cervical / Trunk Assessment: Kyphotic (Forward head)   Communication Communication Communication: Expressive difficulties   Cognition Arousal/Alertness: Awake/alert Behavior During Therapy: Flat affect Overall Cognitive Status: No family/caregiver present to determine baseline cognitive functioning Area of Impairment: Memory;Safety/judgement;Problem solving;Awareness;Orientation                 Orientation Level: Time   Memory: Decreased short-term memory   Safety/Judgement: Decreased awareness of safety Awareness: Emergent Problem Solving: Slow processing;Decreased initiation;Requires tactile cues General Comments: Patient is a questionable historian providing conflicting PLOF. ALF able to confirm PLOF via phonecall.   General Comments  Patient reporting dizziness while standing at sink. BP 130/120, immediate recheck with BP of 153/106. Patient returned to sitting position in recliner with BP 146/94. RN notified.    Exercises     Shoulder Instructions      Home Living Family/patient expects to be discharged to:: Assisted living     Type of Home:  Assisted living                       Home Equipment: Wheelchair - manual;Walker - 2 wheels;Grab bars - toilet;Grab bars - tub/shower;Hand held shower head          Prior Functioning/Environment Level of Independence: Needs assistance  Gait / Transfers Assistance Needed: Supervision for mobility without use of AD/AE. ADL's / Homemaking Assistance Needed: [employer] to set-up for all ADLs            OT Problem List: Decreased strength;Decreased range of motion;Decreased activity tolerance;Impaired balance (sitting and/or standing);Decreased coordination;Decreased safety awareness;Decreased knowledge of use of DME or AE;Impaired sensation;Impaired UE functional use      OT Treatment/Interventions: Self-care/ADL training;Therapeutic exercise;Neuromuscular education;Energy conservation;DME and/or AE instruction;Therapeutic activities;Cognitive remediation/compensation;Patient/family education;Balance training    OT Goals(Current goals can be found in the care plan section) Acute Rehab OT Goals Patient Stated Goal: To return home. OT Goal Formulation: With patient Time For Goal Achievement: 08/25/20 Potential to Achieve Goals: Good ADL Goals Pt Will Perform Grooming: with supervision;standing Pt Will Perform Upper Body Dressing: with supervision;sitting Pt Will Perform Lower Body Dressing: with supervision;sit to/from stand Pt Will Transfer to Toilet: with supervision;ambulating Pt Will Perform Toileting - Clothing Manipulation and hygiene: with supervision;sit to/from stand  OT Frequency: Min 2X/week   Barriers to D/C:            Co-evaluation PT/OT/SLP Co-Evaluation/Treatment: Yes Reason for Co-Treatment: Complexity of the patient's impairments (multi-system involvement);For patient/therapist safety   OT goals addressed during session: ADL's and self-care      AM-PAC OT "6 Clicks" Daily Activity     Outcome Measure Help from another person eating meals?: A  Little Help from another person taking care of  personal grooming?: A Little Help from another person toileting, which includes using toliet, bedpan, or urinal?: A Little Help from another person bathing (including washing, rinsing, drying)?: A Little Help from another person to put on and taking off regular upper body clothing?: A Little Help from another person to put on and taking off regular lower body clothing?: A Little 6 Click Score: 18   End of Session Equipment Utilized During Treatment: Gait belt;Rolling walker Nurse Communication: Mobility status;Other (comment) (Elevated BP)  Activity Tolerance: Treatment limited secondary to medical complications (Comment) (Elevated BP) Patient left: in chair;with call bell/phone within reach;with chair alarm set  OT Visit Diagnosis: Unsteadiness on feet (R26.81);Muscle weakness (generalized) (M62.81);Hemiplegia and hemiparesis Hemiplegia - Right/Left: Left Hemiplegia - dominant/non-dominant: Non-Dominant Hemiplegia - caused by: Cerebral infarction                Time: 0930-1015 OT Time Calculation (min): 45 min Charges:  OT General Charges $OT Visit: 1 Visit OT Evaluation $OT Eval Moderate Complexity: 1 Mod OT Treatments $Self Care/Home Management : 8-22 mins  Zafiro Routson H. OTR/L Supplemental OT, Department of rehab services [phone]  Nitin Mckowen R H. 08/11/2020, 11:29 AM

## 2020-08-11 NOTE — Evaluation (Signed)
Physical Therapy Evaluation Patient Details Name: Heidi Blevins MRN: [id number] DOB: Jul 22, 1951 Today's Date: 08/11/2020   History of Present Illness  Pt is a 69 year old female with medical hx of AKI, thyroid nodule, insomnia, depression, bifascicular block, arrhythmia, scoliosis, UTI, schizoaffective disorder who presented from [facility] with altered mental status and L side facial droop. 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  Clinical Impression  PTA patient was residing at an ALF and was grossly supervision/independent for all functional mobility without use of AD/AE. Patient currently presents with deficits including decreased cognition, expressive and mild receptive aphasia, difficulty sequencing familiar tasks, decreased static/dynamic standing balance, and need for overall min guard-Min A for all mobility with use of RW. Pt demonstrates decreased L-sided leg strength, sensation to light touch, and coordination including noted dysdiadochokinesia compared to her R side. Her impaired cognition and deficits on her L side impact her safety with mobility as she tends to ambulate more proximal to R side of RW with her trunk rotated to her R and R leg externally rotated. Recommendation for return to ALF with [facility] PT and 24hr supervision/assist. Will continue to follow acutely.    Follow Up Recommendations Home health PT;Supervision for mobility/OOB (continue with PT at ALF)    Equipment Recommendations  3in1 (PT)    Recommendations for Other Services       Precautions / Restrictions Precautions Precautions: Fall Restrictions Weight Bearing Restrictions: No      Mobility  Bed Mobility Overal bed mobility: Needs Assistance Bed Mobility: Supine to Sit     Supine to sit: Min assist     General bed mobility comments: Min gurard with mod multimodal cues for  sequencing.    Transfers Overall transfer level: Needs assistance Equipment used: Rolling walker (2 wheeled) Transfers: Sit to/from [facility] Sit to Stand: Min guard;Min assist Stand pivot transfers: Min assist       General transfer comment: Min guard for sit to stand from elevated surfaces and Min A for low surfaces with cues for hand placement. Min A for stand-pivot transfers with multimodal cues for walker management and proximity.  Ambulation/Gait Ambulation/Gait assistance: Min guard;Min assist Gait Distance (Feet): 20 Feet Assistive device: Rolling walker (2 wheeled) Gait Pattern/deviations: Step-through pattern;Decreased stride length;Staggering right;Trunk flexed Gait velocity: decreased Gait velocity interpretation: <1.31 ft/sec, indicative of household ambulator General Gait Details: Ambulates with trunk rotated to R and R leg externally rotated, staying more proximal to R side of RW than L. She also pushes L side of RW more anterior compared to R. No overt LOB thus min guard assist majority of time, but unsafe tendencies when finishing steps to get to chair such as attempting to sit prematurely to fully turning thus minA for safety.  Stairs            Wheelchair Mobility    Modified Rankin (Stroke Patients Only) Modified Rankin (Stroke Patients Only) Pre-Morbid Rankin Score: Slight disability Modified Rankin: Moderately severe disability     Balance Overall balance assessment: Needs assistance Sitting-balance support: Bilateral upper extremity supported;Feet supported Sitting balance-Leahy Scale: Fair Sitting balance - Comments: Patient able to maintain static sitting balance at EOB without external assist.   Standing balance support: Bilateral upper extremity supported;During functional activity Standing balance-Leahy Scale: Fair Standing balance comment: Patient able to maintain static standing balance at sink during grooming tasks with  hip braced on  sink and no UE support.                             Pertinent Vitals/Pain Pain Assessment: 0-10 Pain Score: 5  Pain Location: abdomen and headache Pain Descriptors / Indicators: Grimacing;Discomfort Pain Intervention(s): Limited activity within patient's tolerance;Monitored during session;Patient requesting pain meds-RN notified    Home Living Family/patient expects to be discharged to:: Assisted living     Type of Home: Assisted living         Home Equipment: Wheelchair - manual;Walker - 2 wheels;Grab bars - toilet;Grab bars - tub/shower;Hand held shower head      Prior Function Level of Independence: Needs assistance   Gait / Transfers Assistance Needed: Supervision for mobility without use of AD/AE.  ADL's / Homemaking Assistance Needed: [employer] to set-up for all ADLs  Comments: Facility reported via phone that they can provide the level of care pt required this date at facility.     Hand Dominance   Dominant Hand: Right    Extremity/Trunk Assessment   Upper Extremity Assessment Upper Extremity Assessment: LUE deficits/detail LUE Deficits / Details: Brunnstrom stage 5 LUE Sensation: decreased light touch;decreased proprioception LUE Coordination: decreased fine motor;decreased gross motor    Lower Extremity Assessment Lower Extremity Assessment: Defer to PT evaluation RLE Deficits / Details: MMT scores of 4 to 5 grossly RLE Sensation: WNL RLE Coordination: WNL LLE Deficits / Details: MMT scores of 4- to 4 grossly LLE Sensation: decreased light touch (throughout compared to R) LLE Coordination: decreased gross motor;decreased fine motor (dysdiadochokinesia noted)    Cervical / Trunk Assessment Cervical / Trunk Assessment: Kyphotic (Forward head)  Communication   Communication: Expressive difficulties  Cognition Arousal/Alertness: Awake/alert Behavior During Therapy: Flat affect Overall Cognitive Status: No family/caregiver  present to determine baseline cognitive functioning Area of Impairment: Memory;Safety/judgement;Problem solving;Awareness;Orientation                 Orientation Level: Time   Memory: Decreased short-term memory   Safety/Judgement: Decreased awareness of safety Awareness: Emergent Problem Solving: Slow processing;Decreased initiation;Requires tactile cues General Comments: Patient is a questionable historian providing conflicting PLOF. ALF able to confirm PLOF via phonecall.      General Comments General comments (skin integrity, edema, etc.): Patient reporting dizziness while standing at sink. BP 130/120, immediate recheck with BP of 153/106. Patient returned to sitting position in recliner with BP 146/94. RN notified.    Exercises     Assessment/Plan    PT Assessment Patient needs continued PT services  PT Problem List Decreased strength;Decreased activity tolerance;Decreased balance;Decreased mobility;Decreased coordination;Decreased cognition;Decreased knowledge of use of DME;Decreased safety awareness;Decreased knowledge of precautions;Cardiopulmonary status limiting activity;Impaired sensation;Pain       PT Treatment Interventions DME instruction;Gait training;Functional mobility training;Therapeutic activities;Therapeutic exercise;Balance training;Neuromuscular re-education;Cognitive remediation    PT Goals (Current goals can be found in the Care Plan section)  Acute Rehab PT Goals Patient Stated Goal: To return home. PT Goal Formulation: With patient Time For Goal Achievement: 08/25/20 Potential to Achieve Goals: Good    Frequency Min 3X/week   Barriers to discharge        Co-evaluation PT/OT/SLP Co-Evaluation/Treatment: Yes Reason for Co-Treatment: Complexity of the patient's impairments (multi-system involvement);For patient/therapist safety PT goals addressed during session: Mobility/safety with mobility;Balance;Proper use of DME OT goals addressed  during session: ADL's and self-care       AM-PAC PT "6 Clicks" Mobility  Outcome Measure Help needed turning from your back to  your side while in a flat bed without using bedrails?: A Little Help needed moving from lying on your back to sitting on the side of a flat bed without using bedrails?: A Little Help needed moving to and from a bed to a chair (including a wheelchair)?: A Little Help needed standing up from a chair using your arms (e.g., wheelchair or bedside chair)?: A Little Help needed to walk in hospital room?: A Little Help needed climbing 3-5 steps with a railing? : A Lot 6 Click Score: 17    End of Session Equipment Utilized During Treatment: Gait belt Activity Tolerance: Patient tolerated treatment well Patient left: in chair;with call bell/phone within reach;with chair alarm set Nurse Communication: Mobility status;Patient requests pain meds;Other (comment) (BP readings) PT Visit Diagnosis: Unsteadiness on feet (R26.81);Other abnormalities of gait and mobility (R26.89);Muscle weakness (generalized) (M62.81);Difficulty in walking, not elsewhere classified (R26.2);Other symptoms and signs involving the nervous system (R29.898);Hemiplegia and hemiparesis Hemiplegia - Right/Left: Left Hemiplegia - dominant/non-dominant: Non-dominant Hemiplegia - caused by: Cerebral infarction    Time: 5498-2641 PT Time Calculation (min) (ACUTE ONLY): 35 min   Charges:   PT Evaluation $PT Eval Moderate Complexity: 1 Mod          Moishe Spice, PT, DPT Acute Rehabilitation Services  Pager: [phone] Office: [phone]   Orvan Falconer 08/11/2020, 12:30 PM

## 2020-08-11 NOTE — Progress Notes (Signed)
[facility] Progress Note  Patient: Heidi Blevins    VOH:[id number]  DOA: 08/08/2020     Date of Service: the patient was seen and examined on 08/11/2020  Brief hospital course: Past medical history of schizoaffective disorder, smoker, lacunar infarct, essential hypertension presented as a code stroke found to have acute CVA. Currently plan is monitor for improvement in oral intake.  Assessment and Plan: Acute ischemic nonhemorrhage right lentiform nucleus infarct/CVA per MRI; with acute associated metabolic encephalopathy.   MRI right lentiform nucleus infarct. Echocardiogram EF 65 to 70%. LDL 120, on Lipitor. Currently on 81 mg aspirin and 75 mg Plavix. DAPT for 3 weeks and then Plavix alone. CTA negative for LVO. EEG suggested moderate diffuse encephalopathy nonspecific. No seizures seen. Follow-up with neurology in 4 weeks  Dysphagia Diet per speech therapy  Acute encephalopathy  complicated by underlying schizoaffective disorder, presume secondary to acute CVA Appears to be resolving or resolved.  Awake and alert.  U/a+, CXR NAD.  Lithium level was low. On multiple psychotropics, see below  Asymptomatic bacteriuria. --afebrile, normal WBC and diff.  No evidence of infection.  Antibiotics stopped.Marland Kitchen  Uncontrolled hypertension, possible hypertensive emergency SBP 220 --Remained stable.  Permissive hypertension  Schizoaffective disorder --Son reported altered behavior after beginning paliperidone IM every 30 days several months ago --Pharmacy recommended decreasing baclofen to 10 mg instead of 20, consider alternative paliperidone based on family input --Psychiatry consulted, discontinued fluphenazine, lurasidone.    Polypharmacy --plan as above  PMH lacunar infarct, cerebrovascular disease, cerebral atrophy, moderate chronic SVD.  Cigarette smoker --Recommend cessation  3.9 cm right thyroid nodule, indeterminate. Further evaluation with  dedicated thyroid ultrasound recommended for further evaluation. --Follow-up as an outpatient  Diet: Dysphagia 1 diet DVT Prophylaxis:   enoxaparin (LOVENOX) injection 40 mg Start: 08/09/20 0000    Advance goals of care discussion: Full code  Family Communication: family was present at bedside, at the time of interview.  The pt provided permission to discuss medical plan with the family. Opportunity was given to ask question and all questions were answered satisfactorily.   Disposition:  Status is: Inpatient  Remains inpatient appropriate because:IV treatments appropriate due to intensity of illness or inability to take PO   Dispo: The patient is from: ALF              Anticipated d/c is to: ALF              Anticipated d/c date is: 1 day              Patient currently is not medically stable to d/c.        Subjective: Minimal oral intake, no nausea no vomiting. No bowel movement. Passing gas.  Physical Exam:  General: Appear in mild distress, no Rash; Oral Mucosa Clear, moist. no Abnormal Neck Mass Or lumps, Conjunctiva normal  Cardiovascular: S1 and S2 Present, no Murmur, Respiratory: good respiratory effort, Bilateral Air entry present and CTA, no Crackles, no wheezes Abdomen: Bowel Sound present, Soft and mild tenderness Extremities: no Pedal edema Neurology: alert and oriented to time, place, and person affect appropriate. no new focal deficit Gait not checked due to patient safety concerns  Vitals:   08/10/20 2345 08/11/20 0318 08/11/20 1132 08/11/20 1544  BP: (!) 167/89 (!) 155/87 (!) 143/97 (!) 153/91  Pulse: 69 66 93 78  Resp: 18 16 18 18   Temp: 98 F (36.7 C) 98.3 F (36.8 C) 98.2 F (36.8 C) 98.3 F (36.8 C)  TempSrc:  Oral Oral    SpO2: 99% 99% 98% 97%  Weight:  65.5 kg    Height:        Intake/Output Summary (Last 24 hours) at 08/11/2020 1832 Last data filed at 08/11/2020 1500 Gross per 24 hour  Intake 760 ml  Output 1600 ml  Net -840 ml    Filed Weights   08/08/20 2221 08/10/20 0332 08/11/20 0318  Weight: 67 kg 65.3 kg 65.5 kg    Data Reviewed: I have personally reviewed and interpreted daily labs, tele strips, imagings as discussed above. I reviewed all nursing notes, pharmacy notes, vitals, pertinent old records I have discussed plan of care as described above with RN and patient/family.  CBC: Recent Labs  Lab 08/08/20 2149 08/08/20 2157 08/08/20 2236 08/10/20 0948 08/11/20 0347  WBC  --  8.9 8.7 8.9 8.2  NEUTROABS  --   --  6.8  --   --   HGB 14.3 12.7 12.7 11.9* 12.0  HCT 42.0 41.7 41.7 37.7 37.6  MCV  --  102.2* 102.5* 96.4 97.4  PLT  --  225 224 260 409   Basic Metabolic Panel: Recent Labs  Lab 08/08/20 2141 08/08/20 2149 08/10/20 0948 08/11/20 0347  NA 137 141 146* 143  K 3.9 3.9 4.1 4.6  CL 107 108 111 111  CO2 22  --  22 24  GLUCOSE 162* 134* 108* 100*  BUN 8 11 18 16   CREATININE 0.95 0.80 0.94 0.94  CALCIUM 10.5*  --  11.1* 10.8*  MG  --   --  2.2  --   PHOS  --   --  2.7  --     Studies: DG Abd Portable 1V  Result Date: 08/11/2020 CLINICAL DATA:  Abdominal distension EXAM: PORTABLE ABDOMEN - 1 VIEW COMPARISON:  August 09, 2020 FINDINGS: Feeding tube no longer present. Moderate stool is seen throughout the colon. There is no appreciable bowel dilatation or air-fluid level to suggest bowel obstruction. No free air. Lung bases clear. Status post total hip replacement on the left. Lumbar scoliosis with rod fixation in portions of the lower thoracic and lumbar regions. IMPRESSION: Moderate stool in colon. No bowel obstruction or free air appreciable. Lung bases clear. Electronically Signed   By: Lowella Grip III M.D.   On: 08/11/2020 12:58    Scheduled Meds:   stroke: mapping our early stages of recovery book   Does not apply Once   aspirin EC  81 mg Oral Daily   atorvastatin  40 mg Oral Daily   benztropine  1 mg Oral BID   clopidogrel  75 mg Oral Daily   enoxaparin  (LOVENOX) injection  40 mg Subcutaneous QHS   feeding supplement  237 mL Oral TID BM   lithium carbonate  150 mg Oral QHS   lithium carbonate  300 mg Oral q AM   LORazepam  0.5 mg Oral TID   multivitamin with minerals  1 tablet Oral Daily   nicotine  14 mg Transdermal Daily   polyethylene glycol  17 g Oral Daily   polyvinyl alcohol  1 drop Both Eyes TID   senna-docusate  1 tablet Oral BID   sertraline  100 mg Oral Daily   simethicone  80 mg Oral QID   Continuous Infusions: PRN Meds: acetaminophen **OR** acetaminophen (TYLENOL) oral liquid 160 mg/5 mL **OR** acetaminophen  Time spent: 35 minutes  Author: Berle Mull, MD [facility] 08/11/2020 6:32 PM  To reach On-call, see care teams to  locate the attending and reach out via [email or web address]. Between 7PM-7AM, please contact night-coverage If you still have difficulty reaching the attending provider, please page the [facility] (Director on Call) for [facility] on amion for assistance.

## 2020-08-11 NOTE — TOC Initial Note (Signed)
Transition of Care [facility]) - Initial/Assessment Note    Patient Details  Name: Heidi Blevins MRN: [id number] Date of Birth: [birth year]  Transition of [facility] CM/SW Contact:    [name], LCSW Phone Number: 08/11/2020, 1:34 PM  Clinical Narrative:                 CSW spoke with Wells Guiles at [employer]. She confirmed that they do have a pureed diet if patient requires at discharge. They will arrange home health PT/OT. Will fax signed FL2 once medically ready to f. [phone].  Expected Discharge Plan: Assisted Living Barriers to Discharge: Continued Medical Work up   Patient Goals and CMS Choice Patient states their goals for this hospitalization and ongoing recovery are:: Return to ALF CMS Medicare.gov Compare Post Acute Care list provided to:: Patient Represenative (must comment) Choice offered to / list presented to : Adult Children  Expected Discharge Plan and Services Expected Discharge Plan: Assisted Living In-house Referral: Clinical Social Work   Post Acute Care Choice: [place] arrangements for the past 2 months: [place]: PT,OT          Prior Living Arrangements/Services Living arrangements for the past 2 months: [place] Lives with:: Facility Resident Patient language and need for interpreter reviewed:: Yes Do you feel safe going back to the place where you live?: Yes      Need for Family Participation in Patient Care: Yes (Comment) Care giver support system in place?: Yes (comment)   Criminal Activity/Legal Involvement Pertinent to Current Situation/Hospitalization: No - Comment as needed  Activities of Daily Living      Permission Sought/Granted Permission sought to share information with : Facility Contact Representative,Family Supports Permission granted to share information with : No  Share Information with NAME: Damion  Permission granted to share  info w AGENCY: ALF  Permission granted to share info w Relationship: Son  Permission granted to share info w Contact Information: [phone]  Emotional Assessment Appearance:: Appears stated age Attitude/Demeanor/Rapport: Unable to Assess Affect (typically observed): Unable to Assess Orientation: : Oriented to Self,Oriented to Place,Oriented to  Time,Oriented to Situation Alcohol / Substance Use: Not Applicable Psych Involvement: Yes (comment)  Admission diagnosis:  Facial droop [R29.810] CVA (cerebral vascular accident) [facility]) [I63.9] Cerebrovascular accident (CVA), unspecified mechanism ([place]) [I63.9] Patient Active Problem List   Diagnosis Date Noted  . Polypharmacy 08/09/2020  . Acute CVA (cerebrovascular accident) ([place]) 08/09/2020  . Thyroid nodule 08/09/2020  . CVA (cerebral vascular accident) ([place]) 08/08/2020  . Bradycardia   . Acute metabolic encephalopathy   . Sepsis secondary to UTI ([place]) 03/20/2019  . Closed displaced fracture of left femoral neck ([place]) 09/26/2018  . AKI (acute kidney injury) ([place]) 06/13/2016  . UTI (urinary tract infection) 06/13/2016  . Essential hypertension 06/13/2016  . Urinary tract infection without hematuria   . Complicated UTI (urinary tract infection) 05/03/2016  . Hypercalcemia 05/03/2016  . Dehydration 05/03/2016  . Acute encephalopathy 05/03/2016  . 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 Schizoaffective disorder ([place]) 05/03/2016  . Bifascicular block 05/03/2016   PCP:  [name], MD Pharmacy:  No Pharmacies Listed    Social Determinants of Health (SDOH) Interventions    Readmission Risk Interventions No flowsheet data found.

## 2020-08-12 ENCOUNTER — Inpatient Hospital Stay (HOSPITAL_COMMUNITY): Payer: Medicare Other

## 2020-08-12 LAB — TROPONIN I (HIGH SENSITIVITY)
Troponin I (High Sensitivity): 7 ng/L (ref <18)
Troponin I (High Sensitivity): 7 ng/L (ref <18)

## 2020-08-12 LAB — CBC WITH DIFFERENTIAL/PLATELET
Abs Immature Granulocytes: 0.04 10*3/uL (ref 0.00–0.07)
Basophils Absolute: 0.1 10*3/uL (ref 0.0–0.1)
Basophils Relative: 1 %
Eosinophils Absolute: 0.3 10*3/uL (ref 0.0–0.5)
Eosinophils Relative: 4 %
HCT: 33.7 % — ABNORMAL LOW (ref 36.0–46.0)
Hemoglobin: 11.3 g/dL — ABNORMAL LOW (ref 12.0–15.0)
Immature Granulocytes: 1 %
Lymphocytes Relative: 41 %
Lymphs Abs: 3 10*3/uL (ref 0.7–4.0)
MCH: 32.2 pg (ref 26.0–34.0)
MCHC: 33.5 g/dL (ref 30.0–36.0)
MCV: 96 fL (ref 80.0–100.0)
Monocytes Absolute: 0.7 10*3/uL (ref 0.1–1.0)
Monocytes Relative: 9 %
Neutro Abs: 3.2 10*3/uL (ref 1.7–7.7)
Neutrophils Relative %: 44 %
Platelets: 239 10*3/uL (ref 150–400)
RBC: 3.51 MIL/uL — ABNORMAL LOW (ref 3.87–5.11)
RDW: 12.4 % (ref 11.5–15.5)
WBC: 7.2 10*3/uL (ref 4.0–10.5)
nRBC: 0 % (ref 0.0–0.2)

## 2020-08-12 LAB — COMPREHENSIVE METABOLIC PANEL
ALT: 31 U/L (ref 0–44)
AST: 22 U/L (ref 15–41)
Albumin: 3.3 g/dL — ABNORMAL LOW (ref 3.5–5.0)
Alkaline Phosphatase: 81 U/L (ref 38–126)
Anion gap: 8 (ref 5–15)
BUN: 19 mg/dL (ref 8–23)
CO2: 23 mmol/L (ref 22–32)
Calcium: 9.9 mg/dL (ref 8.9–10.3)
Chloride: 108 mmol/L (ref 98–111)
Creatinine, Ser: 0.93 mg/dL (ref 0.44–1.00)
GFR, Estimated: >60 mL/min (ref >60)
Glucose, Bld: 107 mg/dL — ABNORMAL HIGH (ref 70–99)
Potassium: 4.1 mmol/L (ref 3.5–5.1)
Sodium: 139 mmol/L (ref 135–145)
Total Bilirubin: 0.7 mg/dL (ref 0.3–1.2)
Total Protein: 6 g/dL — ABNORMAL LOW (ref 6.5–8.1)

## 2020-08-12 LAB — RESP PANEL BY RT-PCR (FLU A&B, COVID) ARPGX2
Influenza A by PCR: NEGATIVE
Influenza B by PCR: NEGATIVE
SARS Coronavirus 2 by RT PCR: NEGATIVE

## 2020-08-12 LAB — GLUCOSE, CAPILLARY
Glucose-Capillary: 105 mg/dL — ABNORMAL HIGH (ref 70–99)
Glucose-Capillary: 106 mg/dL — ABNORMAL HIGH (ref 70–99)
Glucose-Capillary: 106 mg/dL — ABNORMAL HIGH (ref 70–99)
Glucose-Capillary: 108 mg/dL — ABNORMAL HIGH (ref 70–99)
Glucose-Capillary: 112 mg/dL — ABNORMAL HIGH (ref 70–99)
Glucose-Capillary: 121 mg/dL — ABNORMAL HIGH (ref 70–99)
Glucose-Capillary: 96 mg/dL (ref 70–99)

## 2020-08-12 LAB — MAGNESIUM: Magnesium: 2.3 mg/dL (ref 1.7–2.4)

## 2020-08-12 LAB — D-DIMER, QUANTITATIVE: D-Dimer, Quant: 0.47 ug/mL-FEU (ref 0.00–0.50)

## 2020-08-12 MED ORDER — TRAMADOL HCL 50 MG PO TABS: 50.0000 mg | ORAL_TABLET | Freq: Four times a day (QID) | ORAL | Status: DC | PRN

## 2020-08-12 MED ORDER — ONDANSETRON HCL 4 MG/2ML IJ SOLN
4.0000 mg | Freq: Four times a day (QID) | INTRAMUSCULAR | Status: DC | PRN
  Administered 2020-08-12: 12:00:00 4 mg via INTRAVENOUS
  Filled 2020-08-12: qty 2

## 2020-08-12 MED ORDER — ALUM & MAG HYDROXIDE-SIMETH 200-200-20 MG/5ML PO SUSP
30.0000 mL | ORAL | Status: DC | PRN
  Administered 2020-08-12: 04:00:00 30 mL via ORAL
  Filled 2020-08-12: qty 30

## 2020-08-12 NOTE — Progress Notes (Signed)
Physical Therapy Treatment Patient Details Name: Heidi Blevins MRN: [id number] DOB: April 08, 1951 Today's Date: 08/12/2020    History of Present Illness Pt is a 69 year old female with medical hx of AKI, thyroid nodule, insomnia, depression, bifascicular block, arrhythmia, scoliosis, UTI, schizoaffective disorder who presented from [place] home with altered mental status and L side facial droop. 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    PT Comments    Pt making progress towards her goals through being able to ambulate an increased distance of ~120 ft with a RW this date. She required min guard assist majority of time with ambulation, but displayed 1 posterior LOB resulting in minA to recover. She displays L hip flexor weakness through decreased L step length compared to R. Provided tactile and verbal cues at bilat legs to improve weight shifting and at bilat hamstrings, esp the L, to inc step length, with success while cues were provided but no carryover once cues were removed. Pt displays unsafe tendencies managing the RW as she pushes it distal to her body and bumps into obstacles on the R and gets it stuck often, despite max cues to correct. Will continue to follow acutely. Current recommendations remain appropriate.    Follow Up Recommendations  Home health PT;Supervision for mobility/OOB (continue with PT at ALF)     Equipment Recommendations  3in1 (PT)    Recommendations for Other Services       Precautions / Restrictions Precautions Precautions: Fall Restrictions Weight Bearing Restrictions: No    Mobility  Bed Mobility Overal bed mobility: Needs Assistance Bed Mobility: Supine to Sit     Supine to sit: Min assist     General bed mobility comments: Cued pt to sit up EOB with bed flat with pt unable to sequence how to manage legs or trunk without  single step verbal and tactile cues. Cued pt to bring 1 leg at a time towards EOB until off and then reach across body with hand to contralateral bed rail to assist with pulling trunk to ascend, minA to complete.  Transfers Overall transfer level: Needs assistance Equipment used: Rolling walker (2 wheeled) Transfers: Sit to/from Stand Sit to Stand: Min assist         General transfer comment: Extra time and minA for steadying to power up to stand, cuing pt to push up from bed pt pt still pulled on RW. Cues provided to reach back for chair prior to return to sit, without success as she held onto RW.  Ambulation/Gait Ambulation/Gait assistance: Min guard;Min assist Gait Distance (Feet): 120 Feet Assistive device: Rolling walker (2 wheeled) Gait Pattern/deviations: Step-through pattern;Decreased stride length;Trunk flexed;Decreased step length - left;Decreased stance time - right;Drifts right/left Gait velocity: decreased Gait velocity interpretation: <1.31 ft/sec, indicative of household ambulator General Gait Details: Ambulates with trunk rotated excessively flexed, pushing RW distal to body despite repeated cues otherwise. Tends to drift more towards the R and bump and get stuck on obstacles on the R with RW more and requires cues to get unstuck. 1x posterior LOB with minA to recover but otherwise min guard assist for safety. Verbal and tactile cues providued for weight shifting bilat esp to R and at bilat hasmtrings esp L to inc step length, with success with tactile and physical assistance but no carryover when cues removed.   Stairs  Wheelchair Mobility    Modified Rankin (Stroke Patients Only) Modified Rankin (Stroke Patients Only) Pre-Morbid Rankin Score: Slight disability Modified Rankin: Moderately severe disability     Balance Overall balance assessment: Needs assistance Sitting-balance support: Bilateral upper extremity supported;Feet supported Sitting  balance-Leahy Scale: Poor Sitting balance - Comments: Patient able to maintain static sitting balance at EOB without external assist.   Standing balance support: Bilateral upper extremity supported;During functional activity Standing balance-Leahy Scale: Poor Standing balance comment: Reliant on UE support for balance.                            Cognition Arousal/Alertness: Awake/alert Behavior During Therapy: WFL for tasks assessed/performed Overall Cognitive Status: No family/caregiver present to determine baseline cognitive functioning Area of Impairment: Memory;Safety/judgement;Problem solving;Awareness                     Memory: Decreased short-term memory   Safety/Judgement: Decreased awareness of safety;Decreased awareness of deficits Awareness: Emergent Problem Solving: Slow processing;Decreased initiation;Requires tactile cues General Comments: Pt requires repeated cues with poor carryover throughout. Pt unable to anticipate bumping/getting stuck on obstacles with R side of RW until she does and then has to correct it, despite cues to prepare for it and avoid all together.      Exercises      General Comments        Pertinent Vitals/Pain Pain Assessment: Faces Faces Pain Scale: Hurts little more Pain Location: chest pain Pain Descriptors / Indicators: Discomfort Pain Intervention(s): Limited activity within patient's tolerance;Monitored during session;Repositioned    Home Living                      Prior Function            PT Goals (current goals can now be found in the care plan section) Acute Rehab PT Goals Patient Stated Goal: To return home. PT Goal Formulation: With patient Time For Goal Achievement: 08/25/20 Potential to Achieve Goals: Good Progress towards PT goals: Progressing toward goals    Frequency    Min 3X/week      PT Plan Current plan remains appropriate    Co-evaluation              AM-PAC  PT "6 Clicks" Mobility   Outcome Measure  Help needed turning from your back to your side while in a flat bed without using bedrails?: A Little Help needed moving from lying on your back to sitting on the side of a flat bed without using bedrails?: A Little Help needed moving to and from a bed to a chair (including a wheelchair)?: A Little Help needed standing up from a chair using your arms (e.g., wheelchair or bedside chair)?: A Little Help needed to walk in hospital room?: A Little Help needed climbing 3-5 steps with a railing? : A Lot 6 Click Score: 17    End of Session Equipment Utilized During Treatment: Gait belt Activity Tolerance: Patient tolerated treatment well Patient left: in chair;with call bell/phone within reach;with chair alarm set   PT Visit Diagnosis: Unsteadiness on feet (R26.81);Other abnormalities of gait and mobility (R26.89);Muscle weakness (generalized) (M62.81);Difficulty in walking, not elsewhere classified (R26.2);Other symptoms and signs involving the nervous system (R29.898);Hemiplegia and hemiparesis Hemiplegia - Right/Left: Left Hemiplegia - dominant/non-dominant: Non-dominant Hemiplegia - caused by: Cerebral infarction     Time: 1721-1739 PT Time Calculation (min) (ACUTE ONLY): 18 min  Charges:  $Gait Training:  8-22 mins                     Moishe Spice, PT, DPT Acute Rehabilitation Services  Pager: [phone] Office: [place] 08/12/2020, 5:51 PM

## 2020-08-12 NOTE — Plan of Care (Signed)
  Problem: Clinical Measurements: Goal: Ability to maintain clinical measurements within normal limits will improve Outcome: Progressing Goal: Will remain free from infection Outcome: Progressing Goal: Diagnostic test results will improve Outcome: Progressing Goal: Respiratory complications will improve Outcome: Progressing Goal: Cardiovascular complication will be avoided Outcome: Progressing   Problem: Activity: Goal: Risk for activity intolerance will decrease Outcome: Progressing   Problem: Coping: Goal: Level of anxiety will decrease Outcome: Progressing   Problem: Elimination: Goal: Will not experience complications related to bowel motility Outcome: Progressing Goal: Will not experience complications related to urinary retention Outcome: Progressing   Problem: Pain Managment: Goal: General experience of comfort will improve Outcome: Progressing   Problem: Skin Integrity: Goal: Risk for impaired skin integrity will decrease Outcome: Progressing   Problem: Safety: Goal: Ability to remain free from injury will improve Outcome: Progressing   Problem: Safety: Goal: Non-violent Restraint(s) Outcome: Progressing   Problem: Safety: Goal: Non-violent Restraint(s) OutcomeMet Outcome: Progressing

## 2020-08-12 NOTE — Progress Notes (Signed)
[facility] Progress Note  Patient: Heidi Blevins    OIN:[id number]  DOA: 08/08/2020     Date of Service: the patient was seen and examined on 08/12/2020  Brief hospital course: Past medical history of schizoaffective disorder, smoker, lacunar infarct, essential hypertension presented as a code stroke found to have acute CVA. Currently plan is monitor serial troponins and monitor on telemetry with complaints of chest pain.  Assessment and Plan: Acute ischemic nonhemorrhage right lentiform nucleus infarct/CVA per MRI; with acute associated metabolic encephalopathy.  MRI right lentiform nucleus infarct. Echocardiogram EF 65 to 70%. LDL 120, on Lipitor. Currently on 81 mg aspirin and 75 mg Plavix. DAPT for 3 weeks and then Plavix alone. CTA negative for LVO. EEG suggested moderate diffuse encephalopathy nonspecific. No seizures seen. Follow-up with neurology in 4 weeks  Dysphagia Diet loss from D1 diet to D3 diet.  Acute encephalopathy  complicated by underlying schizoaffective disorder, presume secondary to acute CVA Appears to be resolving or resolved. Awake and alert. U/a+, CXR NAD. Lithium level was low. On multiple psychotropics, see below  Asymptomatic bacteriuria. --afebrile, normal WBC and diff. No evidence of infection. Antibiotics stopped.Marland Kitchen  Uncontrolled hypertension, possible hypertensive emergency SBP 220 --Remained stable. Permissive hypertension  Schizoaffective disorder --Son reported altered behavior after beginning paliperidone IM every 30 days several months ago --Pharmacy recommended decreasing baclofen to 10 mg instead of 20, consider alternative paliperidone based on family input --Psychiatry consulted, discontinued fluphenazine, lurasidone.   Polypharmacy --plan as above  PMH lacunar infarct, cerebrovascular disease, cerebral atrophy, moderate chronic SVD.  Cigarette smoker --Recommend cessation  3.9 cm right thyroid  nodule, indeterminate. Further evaluation with dedicated thyroid ultrasound recommended for further evaluation. --Follow-up as an outpatient  Chest pain.pleuritic in nature. Right-sided. EKG unremarkable. Troponin x1 -. D-dimer negative. No hypoxia. Musculoskeletal in nature. Tramadol as needed.  Body mass index is 24.03 kg/m.  Nutrition Problem: Predicted suboptimal nutrient intake Etiology: acute illness (CVA) Interventions: Interventions: Ensure Enlive (each supplement provides 350kcal and 20 grams of protein),MVI,Magic cup      Diet: Dysphagia 3 diet. DVT Prophylaxis:   enoxaparin (LOVENOX) injection 40 mg Start: 08/09/20 0000    Advance goals of care discussion: Full code  Family Communication: no family was present at bedside, at the time of interview.   Disposition:  Status is: Inpatient  Remains inpatient appropriate because:Ongoing active pain requiring inpatient pain management   Dispo: The patient is from: ALF              Anticipated d/c is to: ALF              Anticipated d/c date is: 1 day              Patient currently is not medically stable to d/c.    Subjective: Reported right-sided chest pain starting this morning. Also reports fatigue and tiredness. No nausea no vomiting. Actually had a bowel movement. Chest pain is 5 out of 10 in intensity, pleuritic in nature. Worsening with deep breathing.  Physical Exam:  General: Appear in mild distress, no Rash; Oral Mucosa Clear, moist. no Abnormal Neck Mass Or lumps, Conjunctiva normal  Cardiovascular: S1 and S2 Present, no Murmur, Respiratory: good respiratory effort, Bilateral Air entry present and CTA, no Crackles, no wheezes Abdomen: Bowel Sound present, Soft and no tenderness Extremities: no Pedal edema Neurology: alert and oriented to time, place, and person affect appropriate. no new focal deficit Gait not checked due to patient safety concerns  Vitals:  08/12/20 0746 08/12/20 1113 08/12/20  1114 08/12/20 1530  BP: 113/79 125/76 125/76 111/72  Pulse: 79 86 86 89  Resp: 16 18 18 18   Temp: 98.3 F (36.8 C) 98.8 F (37.1 C) 98.8 F (37.1 C) 98.3 F (36.8 C)  TempSrc: Oral Oral Oral Oral  SpO2: 96%  98% 100%  Weight:      Height:        Intake/Output Summary (Last 24 hours) at 08/12/2020 1817 Last data filed at 08/12/2020 0900 Gross per 24 hour  Intake 400 ml  Output 750 ml  Net -350 ml   Filed Weights   08/08/20 2221 08/10/20 0332 08/11/20 0318  Weight: 67 kg 65.3 kg 65.5 kg    Data Reviewed: I have personally reviewed and interpreted daily labs, tele strips, imagings as discussed above. I reviewed all nursing notes, pharmacy notes, vitals, pertinent old records I have discussed plan of care as described above with RN and patient/family.  CBC: Recent Labs  Lab 08/08/20 2157 08/08/20 2236 08/10/20 0948 08/11/20 0347 08/12/20 0134  WBC 8.9 8.7 8.9 8.2 7.2  NEUTROABS  --  6.8  --   --  3.2  HGB 12.7 12.7 11.9* 12.0 11.3*  HCT 41.7 41.7 37.7 37.6 33.7*  MCV 102.2* 102.5* 96.4 97.4 96.0  PLT 225 224 260 260 893   Basic Metabolic Panel: Recent Labs  Lab 08/08/20 2141 08/08/20 2149 08/10/20 0948 08/11/20 0347 08/12/20 0134  NA 137 141 146* 143 139  K 3.9 3.9 4.1 4.6 4.1  CL 107 108 111 111 108  CO2 22  --  22 24 23   GLUCOSE 162* 134* 108* 100* 107*  BUN 8 11 18 16 19   CREATININE 0.95 0.80 0.94 0.94 0.93  CALCIUM 10.5*  --  11.1* 10.8* 9.9  MG  --   --  2.2  --  2.3  PHOS  --   --  2.7  --   --     Studies: DG CHEST PORT 1 VIEW  Result Date: 08/12/2020 CLINICAL DATA:  Chest pain and shortness of breath EXAM: PORTABLE CHEST 1 VIEW COMPARISON:  08/08/2020 FINDINGS: Cardiac shadow is enlarged but stable. Aortic calcifications are again seen. Fixation rod is noted traversing the thoracic and lumbar spine. Stable scoliosis is seen concave to the left. Lungs are clear bilaterally. IMPRESSION: No acute abnormality noted. Electronically Signed   By:  Inez Catalina M.D.   On: 08/12/2020 13:12    Scheduled Meds: .  stroke: mapping our early stages of recovery book   Does not apply Once  . aspirin EC  81 mg Oral Daily  . atorvastatin  40 mg Oral Daily  . benztropine  1 mg Oral BID  . clopidogrel  75 mg Oral Daily  . enoxaparin (LOVENOX) injection  40 mg Subcutaneous QHS  . feeding supplement  237 mL Oral TID BM  . lithium carbonate  150 mg Oral QHS  . lithium carbonate  300 mg Oral q AM  . LORazepam  0.5 mg Oral TID  . multivitamin with minerals  1 tablet Oral Daily  . nicotine  14 mg Transdermal Daily  . polyethylene glycol  17 g Oral Daily  . polyvinyl alcohol  1 drop Both Eyes TID  . senna-docusate  1 tablet Oral BID  . sertraline  100 mg Oral Daily  . simethicone  80 mg Oral QID   Continuous Infusions: PRN Meds: acetaminophen **OR** acetaminophen (TYLENOL) oral liquid 160 mg/5 mL **OR** acetaminophen, alum &  mag hydroxide-simeth, ondansetron (ZOFRAN) IV, traMADol  Time spent: 35 minutes  Author: Berle Mull, MD [facility] 08/12/2020 6:17 PM  To reach On-call, see care teams to locate the attending and reach out via [email or web address]. Between 7PM-7AM, please contact night-coverage If you still have difficulty reaching the attending provider, please page the [facility] (Director on Call) for [facility] on amion for assistance.

## 2020-08-12 NOTE — Progress Notes (Signed)
  Speech Language Pathology Treatment: Dysphagia;Cognitive-Linquistic  Patient Details Name: Heidi Blevins MRN: [id number] DOB: 04-Jul-1951 Today's Date: 08/12/2020 Time: 1115-5208 SLP Time Calculation (min) (ACUTE ONLY): 13 min  Assessment / Plan / Recommendation Clinical Impression  SWALLOWING Pt is tolerating puree diet with good PO intake and no clinical s/s of aspiration.  With advanced textures pt was able to masticate cracker today.  Prolonged oral phase noted with adequate oral clearance.  With soft solid, oral phase was more efficient.   Recommend mechanical soft diet with thin liquids.  COMMUNICATION Pt's speech is much more clear today.  Increased vocal intensity and improved articulation noted.  Reassessed speech and language tasks from 12/14.  Today pt was 100% accurate with yes/no questions.  Pt followed 4/5 2 step commands accurately omitting only 1 item.  Pt was able to follow 3 step command as well.  Pt 100% accurate with naming, even with less common targets.  Pt appears to have improved dramatically now that she is able to take her regular medications.  Suspect pt is at baseline level of function, and appears appropriate to discharge to her facility. Recommend reassessment in her home environment and ST if indicated to maximize functional independence at her facility.  If there are areas of significant change in function from baseline, please notify SLP.    HPI HPI: Heidi Blevins is a 69 y.o. female with medical history significant of  AKI, UTI, schizoaffective disease here presenting altered mental status.  Patient is from [facility]. History, includes several previous presentations similar to this presentation due to UTIs. In prior admission in september/october pt struggled with PO intake due to lethargy. Was consuming puree and thin due to falling asleep with PO in her mouth.   MRI 12/12: "7 mm acute ischemic nonhemorrhagic right lentiform nucleus   infarct."      SLP Plan  Continue with current plan of care       Recommendations  Diet recommendations: Dysphagia 3 (mechanical soft);Thin liquid Liquids provided via: Straw;Cup Medication Administration: Whole meds with liquid Supervision: Staff to assist with self feeding Compensations: Slow rate;Small sips/bites Postural Changes and/or Swallow Maneuvers: Seated upright 90 degrees                Oral Care Recommendations: Oral care BID Follow up Recommendations: None (Reassess at next level of care if different from baseline) SLP Visit Diagnosis: Dysphagia, unspecified (R13.10);Dysarthria and anarthria (R47.1) Plan: Continue with current plan of care       [place], [place], [place] Central Office: [phone] 08/12/2020, 11:38 AM

## 2020-08-13 LAB — GLUCOSE, CAPILLARY
Glucose-Capillary: 93 mg/dL (ref 70–99)
Glucose-Capillary: 100 mg/dL — ABNORMAL HIGH (ref 70–99)
Glucose-Capillary: 90 mg/dL (ref 70–99)

## 2020-08-13 MED ORDER — LORAZEPAM 0.5 MG PO TABS: 0.5000 mg | ORAL_TABLET | Freq: Three times a day (TID) | ORAL | 0 refills | Status: DC | PRN

## 2020-08-13 MED ORDER — CLOPIDOGREL BISULFATE 75 MG PO TABS: 75.0000 mg | ORAL_TABLET | Freq: Every day | ORAL | 0 refills | Status: DC

## 2020-08-13 MED ORDER — SIMETHICONE 80 MG PO CHEW: 80.0000 mg | CHEWABLE_TABLET | Freq: Four times a day (QID) | ORAL | 0 refills | Status: AC

## 2020-08-13 MED ORDER — ASPIRIN 81 MG PO TBEC: 81.0000 mg | DELAYED_RELEASE_TABLET | Freq: Every day | ORAL | 0 refills | Status: AC

## 2020-08-13 MED ORDER — ATORVASTATIN CALCIUM 40 MG PO TABS: 40.0000 mg | ORAL_TABLET | Freq: Every day | ORAL | 0 refills | Status: DC

## 2020-08-13 MED ORDER — ADULT MULTIVITAMIN W/MINERALS CH: 1.0000 | ORAL_TABLET | Freq: Every day | ORAL | 0 refills | Status: DC

## 2020-08-13 MED ORDER — NICOTINE 14 MG/24HR TD PT24: 14.0000 mg | MEDICATED_PATCH | Freq: Every day | TRANSDERMAL | 0 refills | Status: DC

## 2020-08-13 NOTE — Care Management Important Message (Signed)
Important Message  Patient Details  Name: Heidi Blevins MRN: [id number] Date of Birth: [birth year]   Medicare Important Message Given:  Yes   Patient left prior to IM delivery.  Copy mailed to the patient home address. Orbie Pyo 08/13/2020, 2:59 PM

## 2020-08-13 NOTE — TOC Transition Note (Signed)
Transition of Care [facility]) - CM/SW Discharge Note   Patient Details  Name: GLADIES SOFRANKO MRN: [id number] Date of Birth: [birth year]  Transition of Care [facility]) CM/SW Contact:  Pollie Friar, RN Phone Number: 08/13/2020, 11:46 AM   Clinical Narrative:    Pt is discharging back to [facility]. CM spoke to Lima at [facility] and they are ready for pt. PTAR arranged, bedside RN updated and d/c packet at the desk.  Number for [phone]   Final next level of care: Assisted Living Barriers to Discharge: No Barriers Identified   Patient Goals and CMS Choice Patient states their goals for this hospitalization and ongoing recovery are:: Return to ALF CMS Medicare.gov Compare Post Acute Care list provided to:: Patient Represenative (must comment) Choice offered to / list presented to : Adult Children  Discharge Placement                Patient to be transferred to facility by: [facility] Name of family member notified: left voicemail for Brian--son Patient and family notified of of transfer: 08/13/20  Discharge Plan and Services In-house Referral: Clinical Social Work   Post Acute Care Choice: Home Health                    HH Arranged: PT,OT          Social Determinants of Health (SDOH) Interventions     Readmission Risk Interventions No flowsheet data found.

## 2020-08-13 NOTE — Plan of Care (Signed)
Problem: Education: Goal: Knowledge of General Education information will improve Description: Including pain rating scale, medication(s)/side effects and non-pharmacologic comfort measures 08/13/2020 1232 by Dorris Carnes, RN Outcome: Adequate for Discharge 08/13/2020 1213 by Dorris Carnes, RN Outcome: Progressing 08/13/2020 1211 by Dorris Carnes, RN Outcome: Progressing 08/13/2020 1209 by Dorris Carnes, RN Outcome: Progressing   Problem: Health Behavior/Discharge Planning: Goal: Ability to manage health-related needs will improve 08/13/2020 1232 by Dorris Carnes, RN Outcome: Adequate for Discharge 08/13/2020 1213 by Dorris Carnes, RN Outcome: Progressing 08/13/2020 1211 by Dorris Carnes, RN Outcome: Progressing 08/13/2020 1209 by Dorris Carnes, RN Outcome: Progressing   Problem: Clinical Measurements: Goal: Ability to maintain clinical measurements within normal limits will improve 08/13/2020 1232 by Dorris Carnes, RN Outcome: Adequate for Discharge 08/13/2020 1213 by Dorris Carnes, RN Outcome: Progressing 08/13/2020 1211 by Dorris Carnes, RN Outcome: Progressing 08/13/2020 1209 by Dorris Carnes, RN Outcome: Progressing Goal: Will remain free from infection 08/13/2020 1232 by Dorris Carnes, RN Outcome: Adequate for Discharge 08/13/2020 1213 by Dorris Carnes, RN Outcome: Progressing 08/13/2020 1211 by Dorris Carnes, RN Outcome: Progressing 08/13/2020 1209 by Dorris Carnes, RN Outcome: Progressing Goal: Diagnostic test results will improve 08/13/2020 1232 by Dorris Carnes, RN Outcome: Adequate for Discharge 08/13/2020 1213 by Dorris Carnes, RN Outcome: Progressing 08/13/2020 1211 by Dorris Carnes, RN Outcome: Progressing 08/13/2020 1209 by Dorris Carnes, RN Outcome: Progressing Goal: Respiratory complications will improve 08/13/2020 1232 by Dorris Carnes, RN Outcome: Adequate for  Discharge 08/13/2020 1213 by Dorris Carnes, RN Outcome: Progressing 08/13/2020 1211 by Dorris Carnes, RN Outcome: Progressing 08/13/2020 1209 by Dorris Carnes, RN Outcome: Progressing Goal: Cardiovascular complication will be avoided 08/13/2020 1232 by Dorris Carnes, RN Outcome: Adequate for Discharge 08/13/2020 1213 by Dorris Carnes, RN Outcome: Progressing 08/13/2020 1211 by Dorris Carnes, RN Outcome: Progressing 08/13/2020 1209 by Dorris Carnes, RN Outcome: Progressing   Problem: Activity: Goal: Risk for activity intolerance will decrease 08/13/2020 1232 by Dorris Carnes, RN Outcome: Adequate for Discharge 08/13/2020 1213 by Dorris Carnes, RN Outcome: Progressing 08/13/2020 1211 by Dorris Carnes, RN Outcome: Progressing 08/13/2020 1209 by Dorris Carnes, RN Outcome: Progressing   Problem: Nutrition: Goal: Adequate nutrition will be maintained 08/13/2020 1232 by Dorris Carnes, RN Outcome: Adequate for Discharge 08/13/2020 1213 by Dorris Carnes, RN Outcome: Progressing 08/13/2020 1211 by Dorris Carnes, RN Outcome: Progressing 08/13/2020 1209 by Dorris Carnes, RN Outcome: Progressing   Problem: Coping: Goal: Level of anxiety will decrease 08/13/2020 1232 by Dorris Carnes, RN Outcome: Adequate for Discharge 08/13/2020 1213 by Dorris Carnes, RN Outcome: Progressing 08/13/2020 1211 by Dorris Carnes, RN Outcome: Progressing 08/13/2020 1209 by Dorris Carnes, RN Outcome: Progressing   Problem: Coping: Goal: Level of anxiety will decrease 08/13/2020 1232 by Dorris Carnes, RN Outcome: Adequate for Discharge 08/13/2020 1213 by Dorris Carnes, RN Outcome: Progressing 08/13/2020 1211 by Dorris Carnes, RN Outcome: Progressing 08/13/2020 1209 by Dorris Carnes, RN Outcome: Progressing   Problem: Elimination: Goal: Will not experience complications related to bowel  motility 08/13/2020 1232 by Dorris Carnes, RN Outcome: Adequate for Discharge 08/13/2020 1213 by Dorris Carnes, RN Outcome: Progressing 08/13/2020 1211 by Dorris Carnes, RN Outcome: Progressing 08/13/2020 1209 by Dorris Carnes, RN Outcome: Progressing Goal: Will not experience complications related to urinary retention 08/13/2020 1232 by Sheliah Plane  O, RN Outcome: Adequate for Discharge 08/13/2020 1213 by Dorris Carnes, RN Outcome: Progressing 08/13/2020 1211 by Dorris Carnes, RN Outcome: Progressing 08/13/2020 1209 by Dorris Carnes, RN Outcome: Progressing   Problem: Safety: Goal: Ability to remain free from injury will improve 08/13/2020 1232 by Dorris Carnes, RN Outcome: Adequate for Discharge 08/13/2020 1213 by Dorris Carnes, RN Outcome: Progressing 08/13/2020 1211 by Dorris Carnes, RN Outcome: Progressing 08/13/2020 1209 by Dorris Carnes, RN Outcome: Progressing   Problem: Pain Managment: Goal: General experience of comfort will improve 08/13/2020 1232 by Dorris Carnes, RN Outcome: Adequate for Discharge 08/13/2020 1213 by Dorris Carnes, RN Outcome: Progressing 08/13/2020 1211 by Dorris Carnes, RN Outcome: Progressing 08/13/2020 1209 by Dorris Carnes, RN Outcome: Progressing   Problem: Skin Integrity: Goal: Risk for impaired skin integrity will decrease 08/13/2020 1232 by Dorris Carnes, RN Outcome: Adequate for Discharge 08/13/2020 1213 by Dorris Carnes, RN Outcome: Progressing 08/13/2020 1211 by Dorris Carnes, RN Outcome: Progressing 08/13/2020 1209 by Dorris Carnes, RN Outcome: Progressing   Problem: Education: Goal: Knowledge of secondary prevention will improve 08/13/2020 1232 by Dorris Carnes, RN Outcome: Adequate for Discharge 08/13/2020 1213 by Dorris Carnes, RN Outcome: Progressing 08/13/2020 1211 by Dorris Carnes, RN Outcome: Progressing 08/13/2020 1209  by Dorris Carnes, RN Outcome: Progressing Goal: Knowledge of patient specific risk factors addressed and post discharge goals established will improve 08/13/2020 1232 by Dorris Carnes, RN Outcome: Adequate for Discharge 08/13/2020 1213 by Dorris Carnes, RN Outcome: Progressing 08/13/2020 1211 by Dorris Carnes, RN Outcome: Progressing 08/13/2020 1209 by Dorris Carnes, RN Outcome: Progressing Goal: Individualized Educational Video(s) 08/13/2020 1232 by Dorris Carnes, RN Outcome: Adequate for Discharge 08/13/2020 1213 by Dorris Carnes, RN Outcome: Progressing 08/13/2020 1211 by Dorris Carnes, RN Outcome: Progressing 08/13/2020 1209 by Dorris Carnes, RN Outcome: Progressing   Problem: Ischemic Stroke/TIA Tissue Perfusion: Goal: Complications of ischemic stroke/TIA will be minimized 08/13/2020 1232 by Dorris Carnes, RN Outcome: Adequate for Discharge 08/13/2020 1213 by Dorris Carnes, RN Outcome: Progressing   Problem: Safety: Goal: Non-violent Restraint(s) 08/13/2020 1232 by Dorris Carnes, RN Outcome: Adequate for Discharge 08/13/2020 1213 by Dorris Carnes, RN Outcome: Progressing 08/13/2020 1211 by Dorris Carnes, RN Outcome: Progressing 08/13/2020 1209 by Dorris Carnes, RN Outcome: Progressing

## 2020-08-13 NOTE — NC FL2 (Addendum)
[place] LEVEL OF CARE SCREENING TOOL     IDENTIFICATION  Patient Name: ROTHA CASSELS Birthdate: [birth year] Sex: female Admission Date (Current Location): 08/08/2020  [facility] and [place] Number:  Herbalist and Address:  [facility] [place] [address], [place], [place] [address]      Provider Number: [id number]  Attending Physician Name and Address:  Lavina Hamman, MD  Relative Name and Phone Number:  Reece Agar, son, [phone]    Current Level of Care: Hospital Recommended Level of Care: Assisted Living Facility Prior Approval Number:    Date Approved/Denied:   PASRR Number:    Discharge Plan: Other (Comment) (ALF)    Current Diagnoses: Patient Active Problem List   Diagnosis Date Noted   Polypharmacy 08/09/2020   Acute CVA (cerebrovascular accident) ([place]) 08/09/2020   Thyroid nodule 08/09/2020   CVA (cerebral vascular accident) ([place]) 08/08/2020   Bradycardia    Acute metabolic encephalopathy    Sepsis secondary to UTI ([place]) 03/20/2019   Closed displaced fracture of left femoral neck ([place]) 09/26/2018   AKI (acute kidney injury) ([place]) 06/13/2016   UTI (urinary tract infection) 06/13/2016   Essential hypertension 06/13/2016   Urinary tract infection without hematuria    Complicated UTI (urinary tract infection) 05/03/2016   Hypercalcemia 05/03/2016   Dehydration 05/03/2016   Acute encephalopathy 05/03/2016   Cerebrovascular disease 05/03/2016   Schizoaffective disorder ([place]) 05/03/2016   Bifascicular block 05/03/2016    Orientation RESPIRATION BLADDER Height & Weight     Self,Time,Place,Situation  Normal Incontinent Weight: 68.1 kg Height:  5\' 5"  (165.1 cm)  BEHAVIORAL SYMPTOMS/MOOD NEUROLOGICAL BOWEL NUTRITION STATUS      Continent Mechanical soft  AMBULATORY STATUS COMMUNICATION OF NEEDS Skin   Extensive Assist Verbally Normal                       Personal Care Assistance  Level of Assistance  Bathing,Feeding,Dressing Bathing Assistance: Maximum assistance Feeding assistance: Limited assistance Dressing Assistance: Limited assistance     Functional Limitations Info  Sight,Hearing,Speech Sight Info: Adequate Hearing Info: Adequate Speech Info: Adequate    SPECIAL CARE FACTORS FREQUENCY  PT (By licensed PT),OT (By licensed OT)     PT Frequency: Home health 3/week OT Frequency: Home health 3/week            Contractures Contractures Info: Not present    Additional Factors Info  Code Status,Allergies,Psychotropic Code Status Info: Full Allergies Info: Sulfonamide Derivatives Psychotropic Info: Zoloft         Current Medications (08/13/2020):  This is the current hospital active medication list Current Facility-Administered Medications  Medication Dose Route Frequency Provider Last Rate Last Admin    stroke: mapping our early stages of recovery book   Does not apply Once Norins, Heinz Knuckles, MD       acetaminophen (TYLENOL) tablet 650 mg  650 mg Oral Q4H PRN Neena Rhymes, MD   650 mg at 08/12/20 2245   Or   acetaminophen (TYLENOL) 160 MG/5ML solution 650 mg  650 mg Per Tube Q4H PRN Neena Rhymes, MD   650 mg at 08/12/20 1152   Or   acetaminophen (TYLENOL) suppository 650 mg  650 mg Rectal Q4H PRN Norins, Heinz Knuckles, MD       alum & mag hydroxide-simeth (MAALOX/MYLANTA) 200-200-20 MG/5ML suspension 30 mL  30 mL Oral Q4H PRN Lavina Hamman, MD   30 mL at 08/12/20 0427   aspirin  EC tablet 81 mg  81 mg Oral Daily Rosalin Hawking, MD   81 mg at 08/12/20 0933   atorvastatin (LIPITOR) tablet 40 mg  40 mg Oral Daily Samuella Cota, MD   40 mg at 08/12/20 2015   benztropine (COGENTIN) tablet 1 mg  1 mg Oral BID Samuella Cota, MD   1 mg at 08/12/20 2233   clopidogrel (PLAVIX) tablet 75 mg  75 mg Oral Daily Rosalin Hawking, MD   75 mg at 08/12/20 0934   enoxaparin (LOVENOX) injection 40 mg  40 mg Subcutaneous QHS Norins, Heinz Knuckles, MD    40 mg at 08/12/20 2245   feeding supplement (ENSURE ENLIVE / ENSURE PLUS) liquid 237 mL  237 mL Oral TID BM Samuella Cota, MD   237 mL at 08/12/20 2248   lithium carbonate capsule 150 mg  150 mg Oral QHS Samuella Cota, MD   150 mg at 08/12/20 2233   lithium carbonate capsule 300 mg  300 mg Oral q AM Samuella Cota, MD   300 mg at 08/13/20 5027   LORazepam (ATIVAN) tablet 0.5 mg  0.5 mg Oral TID Samuella Cota, MD   0.5 mg at 08/12/20 2233   multivitamin with minerals tablet 1 tablet  1 tablet Oral Daily Samuella Cota, MD   1 tablet at 08/12/20 7412   nicotine (NICODERM CQ - dosed in mg/24 hours) patch 14 mg  14 mg Transdermal Daily Samuella Cota, MD   14 mg at 08/12/20 0937   ondansetron (ZOFRAN) injection 4 mg  4 mg Intravenous Q6H PRN Lavina Hamman, MD   4 mg at 08/12/20 1152   polyethylene glycol (MIRALAX / GLYCOLAX) packet 17 g  17 g Oral Daily Samuella Cota, MD   17 g at 08/12/20 8786   polyvinyl alcohol (LIQUIFILM TEARS) 1.4 % ophthalmic solution 1 drop  1 drop Both Eyes TID Samuella Cota, MD   1 drop at 08/12/20 1547   senna-docusate (Senokot-S) tablet 1 tablet  1 tablet Oral BID Lavina Hamman, MD   1 tablet at 08/12/20 2233   sertraline (ZOLOFT) tablet 100 mg  100 mg Oral Daily Samuella Cota, MD   100 mg at 08/12/20 0933   simethicone (MYLICON) chewable tablet 80 mg  80 mg Oral QID Lavina Hamman, MD   80 mg at 08/12/20 2233   traMADol (ULTRAM) tablet 50 mg  50 mg Oral Q6H PRN Lavina Hamman, MD         Discharge Medications:  Medication List    STOP taking these medications   cephALEXin 500 MG capsule Commonly known as: KEFLEX   fluPHENAZine 10 MG tablet Commonly known as: PROLIXIN   Latuda 120 MG Tabs Generic drug: Lurasidone HCl   lisinopril 40 MG tablet Commonly known as: ZESTRIL   melatonin 5 MG Tabs     TAKE these medications   acetaminophen 325 MG tablet Commonly known as: TYLENOL Take 650 mg  by mouth every 4 (four) hours as needed for mild pain, moderate pain, fever or headache. What changed: Another medication with the same name was removed. Continue taking this medication, and follow the directions you see here.   aspirin 81 MG EC tablet Take 1 tablet (81 mg total) by mouth daily for 17 days.   atorvastatin 40 MG tablet Commonly known as: LIPITOR Take 1 tablet (40 mg total) by mouth daily.   baclofen 20 MG tablet Commonly known as:  LIORESAL Take 20 mg by mouth 3 (three) times daily as needed for muscle spasms.   benztropine 1 MG tablet Commonly known as: COGENTIN Take 1 mg by mouth 2 (two) times daily.   clopidogrel 75 MG tablet Commonly known as: PLAVIX Take 1 tablet (75 mg total) by mouth daily.   Cranberry 425 MG Caps Take 425 mg by mouth 2 (two) times a day.   Ferocon capsule Generic drug: ferrous [id number] C-folic acid Take 1 capsule by mouth at bedtime.   Invega Sustenna 156 MG/ML Susy injection Generic drug: paliperidone Inject 156 mg into the muscle every 30 (thirty) days.   lithium carbonate 150 MG capsule Take 300 mg by mouth in the morning.   lithium carbonate 150 MG capsule Take 150 mg by mouth at bedtime.   loratadine 10 MG tablet Commonly known as: CLARITIN Take 10 mg by mouth daily.   LORazepam 0.5 MG tablet Commonly known as: ATIVAN Take 1 tablet (0.5 mg total) by mouth every 8 (eight) hours as needed for anxiety. What changed:   when to take this  reasons to take this   multivitamin with minerals Tabs tablet Take 1 tablet by mouth daily.   nicotine 14 mg/24hr patch Commonly known as: NICODERM CQ - dosed in mg/24 hours Place 1 patch (14 mg total) onto the skin daily.   OVER THE COUNTER MEDICATION Take 1 Bottle by mouth 3 (three) times daily. Mighty shakes   polyethylene glycol powder 17 GM/SCOOP powder Commonly known as: GLYCOLAX/MIRALAX Take 17 g by mouth daily. Mix in 8 oz of fluid and drink by  mouth.   Propylene Glycol 0.6 % Soln Place 1 drop into both eyes 3 (three) times daily.   sertraline 100 MG tablet Commonly known as: ZOLOFT Take 100 mg by mouth daily.   simethicone 80 MG chewable tablet Commonly known as: MYLICON Chew 1 tablet (80 mg total) by mouth 4 (four) times daily.   Vagisil Anti-Itch Medicated 1 % Misc Generic drug: Pramoxine HCl Apply vaginally once a day Apply 1 application topically daily as needed (for vaginal itching). Apply vaginally once a day What changed:   how much to take  how to take this  when to take this  reasons to take this  additional instructions       Relevant Imaging Results:  Relevant Lab Results:   Additional Information SSN: [id number]. Return to ALF with home health PT/OT  Pollie Friar, RN

## 2020-08-13 NOTE — Discharge Summary (Addendum)
[facility] Discharge Summary   Patient: Heidi Blevins HFW:[id number]  PCP: [name], MD  Date of admission: 08/08/2020   Date of discharge:  08/13/2020     Discharge Diagnoses:  Principal Problem:   Acute CVA (cerebrovascular accident) [facility]) Active Problems:   Dehydration   Acute encephalopathy   Schizoaffective disorder ([place])   UTI (urinary tract infection)   Essential hypertension   CVA (cerebral vascular accident) ([place])   Polypharmacy   Thyroid nodule  Admitted From: alf Disposition:  ALF/ILF with home health  Recommendations for Outpatient Follow-up:  1. PCP: please follow up with PCP and neuro as recommended 2. Follow up LABS/TEST:   dedicated thyroid ultrasound   Follow-up Information    [facility]. Schedule an appointment as soon as possible for a visit in 4 week(s).   Specialty: Neurology Contact information: [address] [place] [phone]             Diet recommendation: mechanical soft diet  Activity: The patient is advised to gradually reintroduce usual activities, as tolerated  Discharge Condition: stable  Code Status: Full code   History of present illness: As per the H and P dictated on admission, " Heidi Blevins is a 69 y.o. female with medical history significant of  AKI, UTI, schizoaffective disease here presenting altered mental status. Patient is from [facility]. Her last normal was around 4:30 pm as per nursing staff. She was noted to be altered, confused, and has L facial droop so EMS was called. EMS called code stroke and she vomited in the field. Mr. Kyan Giannone, son, provided much of her history, which includes several previous presentations similar to what he saw today due to UTIs. He also reports that she was started on the past several months on paliperidone and that she seems to have altered behavior afterwards.  Was noted to be  hypertensive 220/100 per EMS.   ED Course: T 98.5  185/106  HR 76  RR 17. Reviewed EDP note which was notable for diminished breath sounds, L facial drop, confusion but moving all extremities but not to command. Dr. Theda Sers for neurology saw patient: on his exam patient was awake but nonverbal, visual fields were full, nl facial sensation to touch, nl facial movement, DTRs 2+ symmetric. Lab revealed nl CBC and diff, nl Cmet except for mild elevation in glucose, calcium. CT head negative, CTA head/neck negative for LVO, MRI brain pending. CXR NAD. TRH called to admit patient for further evaluation and treatment. "  Hospital Course:  Summary of her active problems in the hospital is as following. 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 MRI right lentiform nucleus infarct. Echocardiogram EF 65 to 70%. LDL 120, on Lipitor. Currently on 81 mg aspirin and 75 mg Plavix. DAPT for 3 weeks and then Plavix alone. CTA negative for LVO. EEG suggested moderate diffuse encephalopathy nonspecific.No seizures seen. Follow-up with neurology in 4 weeks  Dysphagia Diet from D1 diet to D3 diet.  Acute encephalopathy  complicated by underlying schizoaffective disorder, presume secondary to acute CVA Appears to be resolving or resolved. Awake and alert. U/a+, CXR NAD. Lithium level was low. On multiple psychotropics, see below  Asymptomatic bacteriuria. --afebrile, normal WBC and diff. No evidence of infection. Antibiotics stopped.Marland Kitchen  Uncontrolled hypertension, possible hypertensive emergency SBP 220 --Remained stable. Permissive hypertension  Schizoaffective disorder --Son reported altered behavior after beginning paliperidone IM every 30  days several months ago --Pharmacy recommended decreasing baclofen to 10 mg instead of 20, consider alternative paliperidone based on family input --Psychiatry consulted,  discontinued fluphenazine, lurasidone.   Polypharmacy --plan as above  PMH lacunar infarct, cerebrovascular disease, cerebral atrophy, moderate chronic SVD.  Cigarette smoker --Recommend cessation  3.9 cm right thyroid nodule, indeterminate. Further evaluation with dedicated thyroid ultrasound recommended for further evaluation. --Follow-up as an outpatient  Chest pain.pleuritic in nature. Right-sided. EKG unremarkable. Troponin, D-dimer negative. No hypoxia. Musculoskeletal in nature. Tylenol as needed.  Patient was seen by physical therapy, who recommended Home health,  was arranged. On the day of the discharge the patient's vitals were stable, and no other acute medical condition were reported by patient. The patient was felt safe to be discharge at ALF/ILF with Home health.  Consultants: Neurology  Procedures: Echocardiogram   Discharge Exam: General: Appear in no distress, no Rash; Oral Mucosa Clear, moist. no Abnormal Neck Mass Or lumps, Conjunctiva normal  Cardiovascular: S1 and S2 Present, no Murmur Respiratory: good respiratory effort, Bilateral Air entry present and CTA, no Crackles, no wheezes Abdomen: Bowel Sound present, Soft and no tenderness Extremities: no Pedal edema Neurology: alert and oriented to time, place, and person affect appropriate. no new focal deficit  Filed Weights   08/10/20 0332 08/11/20 0318 08/13/20 0500  Weight: 65.3 kg 65.5 kg 68.1 kg   Vitals:   08/13/20 0432 08/13/20 0727  BP: 99/61 (!) 102/59  Pulse: 69 75  Resp: 16 16  Temp: 98.4 F (36.9 C) 98.2 F (36.8 C)  SpO2: 100% 94%    DISCHARGE MEDICATION: Allergies as of 08/13/2020      Reactions   Sulfonamide Derivatives Hives          Medication List    STOP taking these medications   cephALEXin 500 MG capsule Commonly known as: KEFLEX   fluPHENAZine 10 MG tablet Commonly known as: PROLIXIN   Latuda 120 MG Tabs Generic drug: Lurasidone HCl   lisinopril 40 MG  tablet Commonly known as: ZESTRIL   melatonin 5 MG Tabs     TAKE these medications   acetaminophen 325 MG tablet Commonly known as: TYLENOL Take 650 mg by mouth every 4 (four) hours as needed for mild pain, moderate pain, fever or headache. What changed: Another medication with the same name was removed. Continue taking this medication, and follow the directions you see here.   aspirin 81 MG EC tablet Take 1 tablet (81 mg total) by mouth daily for 17 days.   atorvastatin 40 MG tablet Commonly known as: LIPITOR Take 1 tablet (40 mg total) by mouth daily.   baclofen 20 MG tablet Commonly known as: LIORESAL Take 20 mg by mouth 3 (three) times daily as needed for muscle spasms.   benztropine 1 MG tablet Commonly known as: COGENTIN Take 1 mg by mouth 2 (two) times daily.   clopidogrel 75 MG tablet Commonly known as: PLAVIX Take 1 tablet (75 mg total) by mouth daily.   Cranberry 425 MG Caps Take 425 mg by mouth 2 (two) times a day.   Ferocon capsule Generic drug: ferrous [id number] C-folic acid Take 1 capsule by mouth at bedtime.   Invega Sustenna 156 MG/ML Susy injection Generic drug: paliperidone Inject 156 mg into the muscle every 30 (thirty) days.   lithium carbonate 150 MG capsule Take 300 mg by mouth in the morning.   lithium carbonate 150 MG capsule Take 150 mg by mouth at bedtime.   loratadine 10 MG  tablet Commonly known as: CLARITIN Take 10 mg by mouth daily.   LORazepam 0.5 MG tablet Commonly known as: ATIVAN Take 1 tablet (0.5 mg total) by mouth every 8 (eight) hours as needed for anxiety. What changed:   when to take this  reasons to take this   multivitamin with minerals Tabs tablet Take 1 tablet by mouth daily.   nicotine 14 mg/24hr patch Commonly known as: NICODERM CQ - dosed in mg/24 hours Place 1 patch (14 mg total) onto the skin daily.   OVER THE COUNTER MEDICATION Take 1 Bottle by mouth 3 (three) times daily. Mighty shakes    polyethylene glycol powder 17 GM/SCOOP powder Commonly known as: GLYCOLAX/MIRALAX Take 17 g by mouth daily. Mix in 8 oz of fluid and drink by mouth.   Propylene Glycol 0.6 % Soln Place 1 drop into both eyes 3 (three) times daily.   sertraline 100 MG tablet Commonly known as: ZOLOFT Take 100 mg by mouth daily.   simethicone 80 MG chewable tablet Commonly known as: MYLICON Chew 1 tablet (80 mg total) by mouth 4 (four) times daily.   Vagisil Anti-Itch Medicated 1 % Misc Generic drug: Pramoxine HCl Apply vaginally once a day Apply 1 application topically daily as needed (for vaginal itching). Apply vaginally once a day What changed:   how much to take  how to take this  when to take this  reasons to take this  additional instructions      Allergies  Allergen Reactions  . Sulfonamide Derivatives Hives        Discharge Instructions    Ambulatory referral to Neurology   Complete by: As directed    Follow up with stroke clinic NP (Jessica Vanschaick or Cecille Rubin, if both not available, consider Zachery Dauer, or [facility]) at [facility] in about 4 weeks. Thanks.   DIET DYS 3   Complete by: As directed    Fluid consistency: Thin   Increase activity slowly   Complete by: As directed       The results of significant diagnostics from this hospitalization (including imaging, microbiology, ancillary and laboratory) are listed below for reference.    Significant Diagnostic Studies: MR BRAIN WO CONTRAST  Result Date: 08/09/2020 CLINICAL DATA:  Follow-up examination for acute stroke. EXAM: MRI HEAD WITHOUT CONTRAST TECHNIQUE: Multiplanar, multiecho pulse sequences of the brain and surrounding structures were obtained without intravenous contrast. COMPARISON:  Prior CT and CTA from 08/08/2020. FINDINGS: Brain: Examination degraded by motion artifact. Generalized age-related cerebral atrophy. Patchy and confluent T2/FLAIR hyperintensity within the periventricular deep white matter  both cerebral hemispheres most consistent with chronic small vessel ischemic disease, moderate in nature. Patchy involvement of the pons noted. Small remote lacunar infarct noted at the right lentiform nucleus. 7 mm focus of restricted diffusion seen involving the posterior right lentiform nucleus, consistent with a small acute ischemic infarct (series 5, image 78). No associated hemorrhage or mass effect. No other diffusion abnormality to suggest acute or subacute ischemia. Gray-white matter differentiation otherwise maintained. No encephalomalacia to suggest chronic cortical infarction. No other evidence for acute or chronic intracranial blood products. No mass lesion, midline shift or mass effect. No hydrocephalus or extra-axial fluid collection. Pituitary gland suprasellar region normal. Midline structures intact. Vascular: Major intracranial vascular flow voids are maintained. Skull and upper cervical spine: Craniocervical junction within normal limits. Bone marrow signal intensity normal. No scalp soft tissue abnormality. Sinuses/Orbits: Globes and orbital soft tissues within normal limits. Scattered mucosal thickening noted within the ethmoidal  air cells and maxillary sinuses. Paranasal sinuses are otherwise clear. No mastoid effusion. Inner ear structures grossly normal. Other: None. IMPRESSION: 1. 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 2. Underlying age-related cerebral atrophy with moderate chronic small vessel ischemic disease. Additional remote lacunar infarct at the right lentiform nucleus. Electronically Signed   By: Jeannine Boga M.D.   On: 08/09/2020 00:49   DG CHEST PORT 1 VIEW  Result Date: 08/12/2020 CLINICAL DATA:  Chest pain and shortness of breath EXAM: PORTABLE CHEST 1 VIEW COMPARISON:  08/08/2020 FINDINGS: Cardiac shadow is enlarged but stable. Aortic calcifications are again seen. Fixation rod is noted traversing the thoracic and lumbar spine. Stable  scoliosis is seen concave to the left. Lungs are clear bilaterally. IMPRESSION: No acute abnormality noted. Electronically Signed   By: Inez Catalina M.D.   On: 08/12/2020 13:12   DG Chest Port 1 View  Result Date: 08/08/2020 CLINICAL DATA:  Dyspnea EXAM: PORTABLE CHEST 1 VIEW COMPARISON:  06/20/2020 FINDINGS: Lungs are clear. No pneumothorax or pleural effusion. Mild cardiomegaly is stable. Tortuosity of the thoracic aorta is unchanged. Moderate thoracic dextroscoliosis again noted with Harrington rod in place. No acute bone abnormality. IMPRESSION: No active disease. Electronically Signed   By: Fidela Salisbury MD   On: 08/08/2020 22:48   DG Abd Portable 1V  Result Date: 08/11/2020 CLINICAL DATA:  Abdominal distension EXAM: PORTABLE ABDOMEN - 1 VIEW COMPARISON:  August 09, 2020 FINDINGS: Feeding tube no longer present. Moderate stool is seen throughout the colon. There is no appreciable bowel dilatation or air-fluid level to suggest bowel obstruction. No free air. Lung bases clear. Status post total hip replacement on the left. Lumbar scoliosis with rod fixation in portions of the lower thoracic and lumbar regions. IMPRESSION: Moderate stool in colon. No bowel obstruction or free air appreciable. Lung bases clear. Electronically Signed   By: Lowella Grip III M.D.   On: 08/11/2020 12:58   DG Abd Portable 1V  Result Date: 08/09/2020 CLINICAL DATA:  Enteric tube placement EXAM: PORTABLE ABDOMEN - 1 VIEW COMPARISON:  03/19/2019 FINDINGS: Single AP supine radiograph of the lower chest and upper abdomen was performed for the purposes of enteric tube localization. A large bore enteric tube courses below the diaphragm with distal tip terminating within the expected location of the distal stomach. Thoracolumbar scoliosis with single fixation rod. IMPRESSION: Enteric tube tip terminating within the distal stomach. Electronically Signed   By: Davina Poke D.O.   On: 08/09/2020 15:18   EEG  adult  Result Date: 08/10/2020 Lora Havens, MD     08/10/2020  9:09 AM Patient Name: Heidi Blevins MRN: [id number] Epilepsy Attending: Lora Havens Referring Physician/Provider: Dr Rosalin Hawking Date: 08/09/2020 Duration: 23.14 mins Patient history: 69 year old female with right small lentiform nucleus infarct and altered mental status.  EEG to evaluate for seizures. Level of alertness: Awake AEDs during EEG study: Lorazepam Technical aspects: This EEG study was done with scalp electrodes positioned according to the 10-20 International system of electrode placement. Electrical activity was acquired at a sampling rate of 500Hz  and reviewed with a high frequency filter of 70Hz  and a low frequency filter of 1Hz . EEG data were recorded continuously and digitally stored. Description: No clear posterior dominant rhythm was seen.  EEG showed continuous generalized 3 to 5 Hz theta-delta slowing.  Generalized periodic discharges with triphasic morphology at 1.5 to 2 Hz were also noted.  Hyperventilation and photic stimulation were not performed.   ABNORMALITY -  Continuous slow, generalized -Periodic discharges with triphasic morphology, generalized IMPRESSION: This study is suggestive of moderate diffuse encephalopathy, nonspecific etiology. The morphology, frequency of the periodic discharges is more suggestive of toxic-metabolic causes but at times can be on the ictal-interictal continuum and therefore if concern for interictal activity persists, consider prolonged study. No seizures or definite epileptiform discharges were seen throughout the recording. Lora Havens   ECHOCARDIOGRAM COMPLETE  Result Date: 08/09/2020    ECHOCARDIOGRAM REPORT   Patient Name:   Heidi Blevins Date of Exam: 08/09/2020 Medical Rec #:  [id number]           Height:       65.0 in Accession #:    [id number]          Weight:       147.7 lb Date of Birth:  1950-12-30            BSA:          1.739 m Patient Age:    52 years             BP:           170/98 mmHg Patient Gender: F                   HR:           100 bpm. Exam Location:  Inpatient Procedure: 2D Echo, Cardiac Doppler and Color Doppler Indications:    Stroke 434.91 / I163.9  History:        Patient has prior history of Echocardiogram examinations, most                 recent 03/20/2019. Risk Factors:Hypertension. Arrhythmia.  Sonographer:    Jonelle Sidle Dance Referring Phys: [place]  1. Left ventricular ejection fraction, by estimation, is 65 to 70%. The left ventricle has normal function. The left ventricle has no regional wall motion abnormalities. There is mild left ventricular hypertrophy. Left ventricular diastolic parameters are consistent with Grade I diastolic dysfunction (impaired relaxation).  2. Right ventricular systolic function is normal. The right ventricular size is normal. Tricuspid regurgitation signal is inadequate for assessing PA pressure.  3. Left atrial size was mildly dilated.  4. A small pericardial effusion is present.  5. The mitral valve is normal in structure. No evidence of mitral valve regurgitation. No evidence of mitral stenosis.  6. The aortic valve is tricuspid. Aortic valve regurgitation is not visualized. No aortic stenosis is present.  7. The inferior vena cava is normal in size with greater than 50% respiratory variability, suggesting right atrial pressure of 3 mmHg. FINDINGS  Left Ventricle: Left ventricular ejection fraction, by estimation, is 65 to 70%. The left ventricle has normal function. The left ventricle has no regional wall motion abnormalities. The left ventricular internal cavity size was normal in size. There is  mild left ventricular hypertrophy. Left ventricular diastolic parameters are consistent with Grade I diastolic dysfunction (impaired relaxation). Right Ventricle: The right ventricular size is normal. No increase in right ventricular wall thickness. Right ventricular systolic function is  normal. Tricuspid regurgitation signal is inadequate for assessing PA pressure. Left Atrium: Left atrial size was mildly dilated. Right Atrium: Right atrial size was normal in size. Pericardium: A small pericardial effusion is present. Mitral Valve: The mitral valve is normal in structure. No evidence of mitral valve regurgitation. No evidence of mitral valve stenosis. Tricuspid Valve: The tricuspid valve is normal in structure. Tricuspid valve regurgitation  is not demonstrated. Aortic Valve: The aortic valve is tricuspid. Aortic valve regurgitation is not visualized. No aortic stenosis is present. Pulmonic Valve: The pulmonic valve was normal in structure. Pulmonic valve regurgitation is not visualized. Aorta: The aortic root is normal in size and structure. Venous: The inferior vena cava is normal in size with greater than 50% respiratory variability, suggesting right atrial pressure of 3 mmHg. IAS/Shunts: No atrial level shunt detected by color flow Doppler.  LEFT VENTRICLE PLAX 2D LVIDd:         4.20 cm  Diastology LVIDs:         3.00 cm  LV e' medial:  8.27 cm/s LV PW:         1.10 cm  LV e' lateral: 8.59 cm/s LV IVS:        1.30 cm LVOT diam:     1.90 cm LV SV:         55 LV SV Index:   32 LVOT Area:     2.84 cm  RIGHT VENTRICLE             IVC RV Basal diam:  2.50 cm     IVC diam: 2.00 cm RV S prime:     15.90 cm/s TAPSE (M-mode): 2.6 cm LEFT ATRIUM             Index       RIGHT ATRIUM           Index LA diam:        3.80 cm 2.19 cm/m  RA Area:     11.80 cm LA Vol (A2C):   61.1 ml 35.13 ml/m RA Volume:   26.10 ml  15.01 ml/m LA Vol (A4C):   45.7 ml 26.28 ml/m LA Biplane Vol: 54.6 ml 31.40 ml/m  AORTIC VALVE LVOT Vmax:   118.00 cm/s LVOT Vmean:  76.000 cm/s LVOT VTI:    0.194 m  AORTA Ao Root diam: 3.50 cm MV A velocity: 132.50 cm/s                             SHUNTS                             Systemic VTI:  0.19 m                             Systemic Diam: 1.90 cm Loralie Champagne MD Electronically  signed by Loralie Champagne MD Signature Date/Time: 08/09/2020/3:50:05 PM    Final    CT HEAD CODE STROKE WO CONTRAST  Result Date: 08/08/2020 CLINICAL DATA:  Code stroke. Initial evaluation for neuro deficit, stroke suspected. EXAM: CT HEAD WITHOUT CONTRAST TECHNIQUE: Contiguous axial images were obtained from the base of the skull through the vertex without intravenous contrast. COMPARISON:  Prior head CT from 06/20/2020 FINDINGS: Brain: Generalized age-related cerebral atrophy with moderate chronic microvascular ischemic disease, stable from previous. Remote lacunar infarct present at the right lentiform nucleus. No acute intracranial hemorrhage. No acute large vessel territory infarct. No mass lesion, midline shift or mass effect. No hydrocephalus or extra-axial fluid collection. Vascular: No asymmetric hyperdense vessel. Scattered vascular calcifications noted within the carotid siphons. Skull: Scalp soft tissues and calvarium within normal limits. Sinuses/Orbits: Globes orbital soft tissues demonstrate no acute finding. Mild mucosal thickening noted within the ethmoidal air cells. Paranasal sinuses are  otherwise clear. No mastoid effusion. Other: None. ASPECTS [facility] Stroke Program Early CT Score) - Ganglionic level infarction (caudate, lentiform nuclei, internal capsule, insula, M1-M3 cortex): 7 - Supraganglionic infarction (M4-M6 cortex): 3 Total score (0-10 with 10 being normal): 10 IMPRESSION: 1. No acute intracranial infarct or other abnormality. 2. ASPECTS is 10. 3. Age-related cerebral atrophy with chronic microvascular ischemic disease. Small remote lacunar infarct at the right lentiform nucleus. These results were communicated to Dr. Theda Sers at 10:06 pmon 12/12/2021by text page via the [facility] messaging system. Electronically Signed   By: Jeannine Boga M.D.   On: 08/08/2020 22:12   CT ANGIO HEAD CODE STROKE  Result Date: 08/08/2020 CLINICAL DATA:  Follow-up examination for acute stroke.  Unresponsive. Six EXAM: CT ANGIOGRAPHY HEAD AND NECK TECHNIQUE: Multidetector CT imaging of the head and neck was performed using the standard protocol during bolus administration of intravenous contrast. Multiplanar CT image reconstructions and MIPs were obtained to evaluate the vascular anatomy. Carotid stenosis measurements (when applicable) are obtained utilizing NASCET criteria, using the distal internal carotid diameter as the denominator. CONTRAST:  33mL OMNIPAQUE IOHEXOL 350 MG/ML SOLN COMPARISON:  Prior head CT from earlier the same day. FINDINGS: CTA NECK FINDINGS Aortic arch: Sized visualized aortic arch of normal caliber with normal 3 vessel morphology. Mild atheromatous change within the undersurface of the arch itself. No hemodynamically significant stenosis about the origin of the great vessels. Right carotid system: Right common and internal carotid arteries are mildly tortuous but are widely patent without stenosis, dissection or occlusion. Left carotid system: Left common and internal carotid arteries somewhat diffusely tortuous but are widely patent without stenosis, dissection or occlusion. Vertebral arteries: Both vertebral arteries arise from the subclavian arteries. Atheromatous plaque at the origin of the left vertebral artery with associated mild ostial stenosis. No proximal subclavian artery stenosis. Vertebral arteries otherwise tortuous but widely patent within the neck without stenosis, dissection or occlusion. Skeleton: No visible acute osseous abnormality. Scoliosis with Harrington rod fixation partially visualized. No discrete or worrisome osseous lesions. Patient is edentulous. Other neck: No other acute soft tissue abnormality within the neck. 3.9 cm right thyroid nodule (series 5, image 132). No other mass or adenopathy. Upper chest: Visualized upper chest demonstrates no acute finding. Review of the MIP images confirms the above findings CTA HEAD FINDINGS Anterior circulation:  Petrous segments widely patent bilaterally. Scattered atheromatous change within the carotid siphons bilaterally. No more than mild stenosis on the right. More mild to moderate stenosis noted about the cavernous left ICA. Right A1 widely patent. Left A1 hypoplastic, accounting for the diminutive left ICA is compared to the right. Normal anterior communicating artery complex. ACAs somewhat tortuous but are widely patent without stenosis. No M1 stenosis or occlusion. Negative MCA bifurcations. Distal MCA branches well perfused and symmetric. Posterior circulation: Both V4 segments widely patent to the vertebrobasilar junction without stenosis. Origin of the right PICA patent and normal. Left PICA origin not well seen. Basilar somewhat diffusely irregular but is widely patent to its distal aspect without significant stenosis. Superior cerebral arteries patent bilaterally. Both PCAs primarily supplied via the basilar well perfused to their distal aspects. Venous sinuses: Grossly patent allowing for timing the contrast bolus. Anatomic variants: Hypoplastic left A1 segment with the ACAs primarily supplied via the right carotid artery system. No aneurysm. Review of the MIP images confirms the above findings IMPRESSION: 1. Negative CTA for emergent large vessel occlusion. 2. Mild for age atheromatous change about the carotid siphons with associated mild to  moderate multifocal narrowing, left worse than right. No other hemodynamically significant or correctable stenosis about the major arterial vasculature of the head and neck. 3. Diffuse tortuosity about the major arterial vasculature of the head and neck, suggesting chronic underlying hypertension. 4. 3.9 cm right thyroid nodule, indeterminate. Further evaluation with dedicated thyroid ultrasound recommended for further evaluation. This could be performed on a nonemergent outpatient basis. (ref: J Am Coll Radiol. 2015 Feb;12(2): 143-50). These results were communicated to  Dr. Theda Sers at 10:31 pmon 12/12/2021by text page via the [facility] messaging system. Electronically Signed   By: Jeannine Boga M.D.   On: 08/08/2020 22:41   CT ANGIO NECK CODE STROKE  Result Date: 08/08/2020 CLINICAL DATA:  Follow-up examination for acute stroke. Unresponsive. Six EXAM: CT ANGIOGRAPHY HEAD AND NECK TECHNIQUE: Multidetector CT imaging of the head and neck was performed using the standard protocol during bolus administration of intravenous contrast. Multiplanar CT image reconstructions and MIPs were obtained to evaluate the vascular anatomy. Carotid stenosis measurements (when applicable) are obtained utilizing NASCET criteria, using the distal internal carotid diameter as the denominator. CONTRAST:  26mL OMNIPAQUE IOHEXOL 350 MG/ML SOLN COMPARISON:  Prior head CT from earlier the same day. FINDINGS: CTA NECK FINDINGS Aortic arch: Sized visualized aortic arch of normal caliber with normal 3 vessel morphology. Mild atheromatous change within the undersurface of the arch itself. No hemodynamically significant stenosis about the origin of the great vessels. Right carotid system: Right common and internal carotid arteries are mildly tortuous but are widely patent without stenosis, dissection or occlusion. Left carotid system: Left common and internal carotid arteries somewhat diffusely tortuous but are widely patent without stenosis, dissection or occlusion. Vertebral arteries: Both vertebral arteries arise from the subclavian arteries. Atheromatous plaque at the origin of the left vertebral artery with associated mild ostial stenosis. No proximal subclavian artery stenosis. Vertebral arteries otherwise tortuous but widely patent within the neck without stenosis, dissection or occlusion. Skeleton: No visible acute osseous abnormality. Scoliosis with Harrington rod fixation partially visualized. No discrete or worrisome osseous lesions. Patient is edentulous. Other neck: No other acute soft tissue  abnormality within the neck. 3.9 cm right thyroid nodule (series 5, image 132). No other mass or adenopathy. Upper chest: Visualized upper chest demonstrates no acute finding. Review of the MIP images confirms the above findings CTA HEAD FINDINGS Anterior circulation: Petrous segments widely patent bilaterally. Scattered atheromatous change within the carotid siphons bilaterally. No more than mild stenosis on the right. More mild to moderate stenosis noted about the cavernous left ICA. Right A1 widely patent. Left A1 hypoplastic, accounting for the diminutive left ICA is compared to the right. Normal anterior communicating artery complex. ACAs somewhat tortuous but are widely patent without stenosis. No M1 stenosis or occlusion. Negative MCA bifurcations. Distal MCA branches well perfused and symmetric. Posterior circulation: Both V4 segments widely patent to the vertebrobasilar junction without stenosis. Origin of the right PICA patent and normal. Left PICA origin not well seen. Basilar somewhat diffusely irregular but is widely patent to its distal aspect without significant stenosis. Superior cerebral arteries patent bilaterally. Both PCAs primarily supplied via the basilar well perfused to their distal aspects. Venous sinuses: Grossly patent allowing for timing the contrast bolus. Anatomic variants: Hypoplastic left A1 segment with the ACAs primarily supplied via the right carotid artery system. No aneurysm. Review of the MIP images confirms the above findings IMPRESSION: 1. Negative CTA for emergent large vessel occlusion. 2. Mild for age atheromatous change about the carotid siphons with  associated mild to moderate multifocal narrowing, left worse than right. No other hemodynamically significant or correctable stenosis about the major arterial vasculature of the head and neck. 3. Diffuse tortuosity about the major arterial vasculature of the head and neck, suggesting chronic underlying hypertension. 4. 3.9 cm  right thyroid nodule, indeterminate. Further evaluation with dedicated thyroid ultrasound recommended for further evaluation. This could be performed on a nonemergent outpatient basis. (ref: J Am Coll Radiol. 2015 Feb;12(2): 143-50). These results were communicated to Dr. Theda Sers at 10:31 pmon 12/12/2021by text page via the [facility] messaging system. Electronically Signed   By: Jeannine Boga M.D.   On: 08/08/2020 22:41    Microbiology: Recent Results (from the past 240 hour(s))  Resp Panel by RT-PCR (Flu A&B, Covid) Nasopharyngeal Swab     Status: None   Collection Time: 08/08/20 10:29 PM   Specimen: Nasopharyngeal Swab; Nasopharyngeal(NP) swabs in vial transport medium  Result Value Ref Range Status   SARS Coronavirus 2 by RT PCR NEGATIVE NEGATIVE Final    Comment: (NOTE) SARS-CoV-2 target nucleic acids are NOT DETECTED.  The SARS-CoV-2 RNA is generally detectable in upper respiratory specimens during the acute phase of infection. The lowest concentration of SARS-CoV-2 viral copies this assay can detect is 138 copies/mL. A negative result does not preclude SARS-Cov-2 infection and should not be used as the sole basis for treatment or other patient management decisions. A negative result may occur with  improper specimen collection/handling, submission of specimen other than nasopharyngeal swab, presence of viral mutation(s) within the areas targeted by this assay, and inadequate number of viral copies(<138 copies/mL). A negative result must be combined with clinical observations, patient history, and epidemiological information. The expected result is Negative.  Fact Sheet for Patients:  EntrepreneurPulse.com.au  Fact Sheet for Healthcare Providers:  IncredibleEmployment.be  This test is no t yet approved or cleared by the [place] FDA and  has been authorized for detection and/or diagnosis of SARS-CoV-2 by FDA under an Emergency Use  Authorization (EUA). This EUA will remain  in effect (meaning this test can be used) for the duration of the COVID-19 declaration under Section 564(b)(1) of the Act, 21 U.S.C.section 360bbb-3(b)(1), unless the authorization is terminated  or revoked sooner.       Influenza A by PCR NEGATIVE NEGATIVE Final   Influenza B by PCR NEGATIVE NEGATIVE Final    Comment: (NOTE) The Xpert Xpress SARS-CoV-2/FLU/RSV plus assay is intended as an aid in the diagnosis of influenza from Nasopharyngeal swab specimens and should not be used as a sole basis for treatment. Nasal washings and aspirates are unacceptable for Xpert Xpress SARS-CoV-2/FLU/RSV testing.  Fact Sheet for Patients: EntrepreneurPulse.com.au  Fact Sheet for Healthcare Providers: IncredibleEmployment.be  This test is not yet approved or cleared by the [place] FDA and has been authorized for detection and/or diagnosis of SARS-CoV-2 by FDA under an Emergency Use Authorization (EUA). This EUA will remain in effect (meaning this test can be used) for the duration of the COVID-19 declaration under Section 564(b)(1) of the Act, 21 U.S.C. section 360bbb-3(b)(1), unless the authorization is terminated or revoked.  Performed at [facility], [place] [address], [place], [place] [address]   Urine Culture     Status: None   Collection Time: 08/08/20 10:52 PM   Specimen: Urine, Random  Result Value Ref Range Status   Specimen Description URINE, RANDOM  Final   Special Requests NONE  Final   Culture   Final    NO GROWTH  Performed at [facility], [place] [address], [place], [place] [address]    Report Status 08/09/2020 FINAL  Final  Resp Panel by RT-PCR (Flu A&B, Covid) Nasopharyngeal Swab     Status: None   Collection Time: 08/12/20 12:04 PM   Specimen: Nasopharyngeal Swab; Nasopharyngeal(NP) swabs in vial transport medium  Result Value Ref Range Status   SARS Coronavirus 2 by RT PCR  NEGATIVE NEGATIVE Final    Comment: (NOTE) SARS-CoV-2 target nucleic acids are NOT DETECTED.  The SARS-CoV-2 RNA is generally detectable in upper respiratory specimens during the acute phase of infection. The lowest concentration of SARS-CoV-2 viral copies this assay can detect is 138 copies/mL. A negative result does not preclude SARS-Cov-2 infection and should not be used as the sole basis for treatment or other patient management decisions. A negative result may occur with  improper specimen collection/handling, submission of specimen other than nasopharyngeal swab, presence of viral mutation(s) within the areas targeted by this assay, and inadequate number of viral copies(<138 copies/mL). A negative result must be combined with clinical observations, patient history, and epidemiological information. The expected result is Negative.  Fact Sheet for Patients:  EntrepreneurPulse.com.au  Fact Sheet for Healthcare Providers:  IncredibleEmployment.be  This test is no t yet approved or cleared by the [place] FDA and  has been authorized for detection and/or diagnosis of SARS-CoV-2 by FDA under an Emergency Use Authorization (EUA). This EUA will remain  in effect (meaning this test can be used) for the duration of the COVID-19 declaration under Section 564(b)(1) of the Act, 21 U.S.C.section 360bbb-3(b)(1), unless the authorization is terminated  or revoked sooner.       Influenza A by PCR NEGATIVE NEGATIVE Final   Influenza B by PCR NEGATIVE NEGATIVE Final    Comment: (NOTE) The Xpert Xpress SARS-CoV-2/FLU/RSV plus assay is intended as an aid in the diagnosis of influenza from Nasopharyngeal swab specimens and should not be used as a sole basis for treatment. Nasal washings and aspirates are unacceptable for Xpert Xpress SARS-CoV-2/FLU/RSV testing.  Fact Sheet for Patients: EntrepreneurPulse.com.au  Fact Sheet for  Healthcare Providers: IncredibleEmployment.be  This test is not yet approved or cleared by the [place] FDA and has been authorized for detection and/or diagnosis of SARS-CoV-2 by FDA under an Emergency Use Authorization (EUA). This EUA will remain in effect (meaning this test can be used) for the duration of the COVID-19 declaration under Section 564(b)(1) of the Act, 21 U.S.C. section 360bbb-3(b)(1), unless the authorization is terminated or revoked.  Performed at [facility], [place] [address], [place], [place] [address]      Labs: CBC: Recent Labs  Lab 08/08/20 2157 08/08/20 2236 08/10/20 0948 08/11/20 0347 08/12/20 0134  WBC 8.9 8.7 8.9 8.2 7.2  NEUTROABS  --  6.8  --   --  3.2  HGB 12.7 12.7 11.9* 12.0 11.3*  HCT 41.7 41.7 37.7 37.6 33.7*  MCV 102.2* 102.5* 96.4 97.4 96.0  PLT 225 224 260 260 063   Basic Metabolic Panel: Recent Labs  Lab 08/08/20 2141 08/08/20 2149 08/10/20 0948 08/11/20 0347 08/12/20 0134  NA 137 141 146* 143 139  K 3.9 3.9 4.1 4.6 4.1  CL 107 108 111 111 108  CO2 22  --  22 24 23   GLUCOSE 162* 134* 108* 100* 107*  BUN 8 11 18 16 19   CREATININE 0.95 0.80 0.94 0.94 0.93  CALCIUM 10.5*  --  11.1* 10.8* 9.9  MG  --   --  2.2  --  2.3  PHOS  --   --  2.7  --   --    Liver Function Tests: Recent Labs  Lab 08/08/20 2141 08/12/20 0134  AST 16 22  ALT 14 31  ALKPHOS 95 81  BILITOT 0.5 0.7  PROT 7.2 6.0*  ALBUMIN 3.8 3.3*   CBG: Recent Labs  Lab 08/12/20 1530 08/12/20 2023 08/12/20 2340 08/13/20 0337 08/13/20 0729  GLUCAP 112* 121* 96 93 100*    Time spent: 35 minutes  Signed:  Berle Mull  [facility]  08/13/2020 9:25 AM

## 2020-08-13 NOTE — Progress Notes (Signed)
Occupational Therapy Treatment Patient Details Name: Heidi Blevins MRN: [id number] DOB: 04-29-1951 Today's Date: 08/13/2020    History of present illness Pt is a 69 year old female with medical hx of AKI, thyroid nodule, insomnia, depression, bifascicular block, arrhythmia, scoliosis, UTI, schizoaffective disorder who presented from [place] home with altered mental status and L side facial droop. 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   OT comments  Pt received in bed, agreeable to OT session. Pt currently requires minguard-minA for toileting/toilet transfers, grooming at sink level and LB dressing. Pt demonstrates limitations with cognition, strength and stability, activity tolerance, and safety awareness. Pt will continue to benefit from skilled OT services to maximize safety and independence with ADL/IADL and functional mobility. Will continue to follow acutely and progress as tolerated.    Follow Up Recommendations  Home health OT;Supervision/Assistance - 24 hour    Equipment Recommendations  3 in 1 bedside commode    Recommendations for Other Services      Precautions / Restrictions Precautions Precautions: Fall Restrictions Weight Bearing Restrictions: No       Mobility Bed Mobility Overal bed mobility: Needs Assistance Bed Mobility: Supine to Sit     Supine to sit: Min assist     General bed mobility comments: minA for safety and to progress trunk to upright position  Transfers Overall transfer level: Needs assistance Equipment used: Rolling walker (2 wheeled) Transfers: Sit to/from Stand Sit to Stand: Min assist         General transfer comment: increased time and effort to prowerup and stabilize, cues for safe hand placement, pt with poor carryover    Balance Overall balance assessment: Needs assistance Sitting-balance support:  No upper extremity supported Sitting balance-Leahy Scale: Fair Sitting balance - Comments: able to maintain sitting balance without UE support   Standing balance support: No upper extremity supported;During functional activity Standing balance-Leahy Scale: Fair Standing balance comment: limited standing balance without UE support                           ADL either performed or assessed with clinical judgement   ADL Overall ADL's : Needs assistance/impaired Eating/Feeding: Set up;Sitting Eating/Feeding Details (indicate cue type and reason): pt reports decreased apetite this date Grooming: Standing;Min guard Grooming Details (indicate cue type and reason): completed hand washing at sink level             Lower Body Dressing: Minimal assistance;Sit to/from stand Lower Body Dressing Details (indicate cue type and reason): Min A to maintain standing balance. Toilet Transfer: Minimal Insurance claims handler Details (indicate cue type and reason): pt had BM Toileting- Clothing Manipulation and Hygiene: Minimal assistance;Sit to/from stand Toileting - Clothing Manipulation Details (indicate cue type and reason): minA for thorough posterior care     Functional mobility during ADLs: Minimal assistance;Rolling walker General ADL Comments: mina for stability and safety with RW management     Vision   Vision Assessment?: No apparent visual deficits   Perception     Praxis      Cognition Arousal/Alertness: Awake/alert Behavior During Therapy: WFL for tasks assessed/performed Overall Cognitive Status: No family/caregiver present to determine baseline cognitive functioning Area of Impairment: Memory;Safety/judgement;Problem solving;Awareness                 Orientation Level: Time   Memory: Decreased short-term memory   Safety/Judgement:  Decreased awareness of safety;Decreased awareness of deficits Awareness: Emergent Problem Solving: Slow  processing;Decreased initiation;Requires tactile cues General Comments: pt with decreased safety awareness,        Exercises     Shoulder Instructions       General Comments VSS    Pertinent Vitals/ Pain       Pain Assessment: No/denies pain Faces Pain Scale: Hurts little more  Home Living                                          Prior Functioning/Environment              Frequency  Min 2X/week        Progress Toward Goals  OT Goals(current goals can now be found in the care plan section)  Progress towards OT goals: Progressing toward goals  Acute Rehab OT Goals Patient Stated Goal: To return home. OT Goal Formulation: With patient Time For Goal Achievement: 08/25/20 Potential to Achieve Goals: Good ADL Goals Pt Will Perform Grooming: with supervision;standing Pt Will Perform Upper Body Dressing: with supervision;sitting Pt Will Perform Lower Body Dressing: with supervision;sit to/from stand Pt Will Transfer to Toilet: with supervision;ambulating Pt Will Perform Toileting - Clothing Manipulation and hygiene: with supervision;sit to/from stand  Plan Discharge plan remains appropriate    Co-evaluation                 AM-PAC OT "6 Clicks" Daily Activity     Outcome Measure   Help from another person eating meals?: A Little Help from another person taking care of personal grooming?: A Little Help from another person toileting, which includes using toliet, bedpan, or urinal?: A Little Help from another person bathing (including washing, rinsing, drying)?: A Little Help from another person to put on and taking off regular upper body clothing?: A Little Help from another person to put on and taking off regular lower body clothing?: A Little 6 Click Score: 18    End of Session Equipment Utilized During Treatment: Gait belt;Rolling walker  OT Visit Diagnosis: Unsteadiness on feet (R26.81);Muscle weakness (generalized)  (M62.81);Hemiplegia and hemiparesis Hemiplegia - Right/Left: Left Hemiplegia - dominant/non-dominant: Non-Dominant Hemiplegia - caused by: Cerebral infarction   Activity Tolerance Treatment limited secondary to medical complications (Comment) (Elevated BP)   Patient Left in chair;with call bell/phone within reach;with chair alarm set   Nurse Communication Mobility status        Time: 1027-2536 OT Time Calculation (min): 16 min  Charges: OT General Charges $OT Visit: 1 Visit OT Treatments $Self Care/Home Management : 8-22 mins  Helene Kelp OTR/L Acute Rehabilitation Services Office: [place] 08/13/2020, 12:50 PM

## 2020-08-13 NOTE — Plan of Care (Signed)
°  Problem: Safety: Goal: Ability to remain free from injury will improve Outcome: Progressing   Problem: Education: Goal: Knowledge of secondary prevention will improve Outcome: Progressing Goal: Knowledge of patient specific risk factors addressed and post discharge goals established will improve Outcome: Progressing Goal: Individualized Educational Video(s) Outcome: Progressing   Problem: Safety: Goal: Non-violent Restraint(s) Outcome: Progressing

## 2020-08-13 NOTE — Plan of Care (Signed)
  Problem: Ischemic Stroke/TIA Tissue Perfusion: Goal: Complications of ischemic stroke/TIA will be minimized Outcome: Progressing   

## 2020-09-16 ENCOUNTER — Inpatient Hospital Stay: Payer: Medicare Other | Admitting: Adult Health

## 2020-10-12 ENCOUNTER — Ambulatory Visit (INDEPENDENT_AMBULATORY_CARE_PROVIDER_SITE_OTHER): Payer: Medicare Other | Admitting: Adult Health

## 2020-10-12 ENCOUNTER — Encounter: Payer: Self-pay | Admitting: Adult Health

## 2020-10-12 VITALS — BP 142/95 | HR 90 | Ht 65.0 in | Wt 149.0 lb

## 2020-10-12 DIAGNOSIS — I639 Cerebral infarction, unspecified: Secondary | ICD-10-CM

## 2020-10-12 NOTE — Progress Notes (Signed)
I agree with the above plan 

## 2020-10-12 NOTE — Progress Notes (Signed)
[facility] [address] [place]. [place] [address] [phone]       HOSPITAL FOLLOW UP NOTE  Ms. Heidi Blevins Date of Birth:  1951-03-28 Medical Record Number:  [id number]   Reason for Referral:  hospital stroke follow up    SUBJECTIVE:   CHIEF COMPLAINT:  Chief Complaint  Patient presents with  . Follow-up    TR dorothy (nurse) Pt is well    HPI:   Ms. Heidi Blevins is a 70 y.o. female with history of AKI, UTI, schizoaffective disease who presented on 08/08/2020 from SNF with altered mental status and L facial droop. Vomited in the field and BP was 220/110.  Personally reviewed hospitalization pertinent progress notes, lab work and imaging with summary provided.  Evaluated by Dr. 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  Recommended DAPT for 3 weeks then Plavix alone as previously on aspirin.  EEG showed moderate diffuse encephalopathy with no evidence of seizure.  History of HTN presenting with hypertensive urgency stabilize during admission with long-term BP thalamic was arranged.  LDL 120 and initiate atorvastatin 40 mg daily.  Other stroke risk factors include prior stroke on imaging, current tobacco use, advanced age and family history of stroke.  Other active problems include schizoaffective disorder on lithium, polypharmacy and right thyroid nodule.  Residual deficit of moderate dysarthria, dysphagia and weakness and discharged back to SNF with continued therapies.  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 Small vessel disease. Atrophy. 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 ASPECTS 10.     CTA head & neck no LVO. Mild B cavernous ICA atherosclerosis L>R. Tortuosity of head and neck.  R thyroid nodule.   MRI  R lentiform nucleus infarct. Small vessel disease. Atrophy. 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  2D Echo EF 65-70%. No source of embolus   LDL 120  HgbA1c 5.3  VTE prophylaxis - Lovenox 40 mg sq daily   aspirin 81 mg daily prior to admission, now on aspirin 81 and plavix 75 DAPT for 3 weeks and then plavix alone.   Therapy recommendations:  SNF  Disposition:  back to SNF  Today, 10/12/2020, Ms. Heidi Blevins is being seen for hospital follow-up accompanied by Earlie Server (SNF staff).  She reports she has recovered well since discharge and denies residual deficits.  Denies new or worsening stroke/TIA symptoms.  Per review of facility [facility], she has continued on DAPT without bleeding or bruising.  Remains on atorvastatin 40 mg daily without myalgias.  Blood pressure today 142/95 which is her typical level.  She continues to smoke less than 3 cigarettes/day (per nurse, will only take 1-2 puffs and throw away the rest).  No concerns at this time     ROS:   14 system review of systems performed and negative with exception of no complaints  PMH:  Past Medical History:  Diagnosis Date  . Anxiety   . Arrhythmia   . Bifascicular block   . Depression   . Hypertension   . Insomnia   . Schizoaffective disorder   . Scoliosis   . Thyroid nodule 08/09/2020  . Vitamin D deficiency     PSH:  Past Surgical History:  Procedure Laterality Date  . BACK SURGERY     "as a  child after she fell out of a tree"  . TONSILLECTOMY    . TOTAL HIP ARTHROPLASTY Left 09/27/2018   Procedure: LEFT TOTAL HIP ARTHROPLASTY ANTERIOR APPROACH;  Surgeon: Leandrew Koyanagi, MD;  Location: [place];  Service: Orthopedics;  Laterality: Left;    Social History:  Social History   Socioeconomic History  . Marital status: Divorced    Spouse name: Not on file  . Number of children: Not on file  . Years of education: Not on file  . Highest education level: Not on file  Occupational History  . Occupation:  Unemployed  Tobacco Use  . Smoking status: Current Every Day Smoker    Packs/day: 0.25    Years: 40.00    Pack years: 10.00    Types: Cigarettes  . Smokeless tobacco: Never Used  Substance and Sexual Activity  . Alcohol use: No  . Drug use: No  . Sexual activity: Never  Other Topics Concern  . Not on file  Social History Narrative   Lives in an ALF.  Normally independent of ADLs and with ambulation.   Social Determinants of Health   Financial Resource Strain: Not on file  Food Insecurity: Not on file  Transportation Needs: Not on file  Physical Activity: Not on file  Stress: Not on file  Social Connections: Not on file  Intimate Partner Violence: Not on file    Family History:  Family History  Problem Relation Age of Onset  . CVA Mother   . Hypertension Mother   . Lupus Sister   . Hypertension Brother   . Peptic Ulcer Disease Father   . Colon cancer Neg Hx     Medications:   Current Outpatient Medications on File Prior to Visit  Medication Sig Dispense Refill  . acetaminophen (TYLENOL) 325 MG tablet Take 650 mg by mouth every 4 (four) hours as needed for mild pain, moderate pain, fever or headache.    Marland Kitchen atorvastatin (LIPITOR) 40 MG tablet Take 1 tablet (40 mg total) by mouth daily. 30 tablet 0  . baclofen (LIORESAL) 20 MG tablet Take 20 mg by mouth 3 (three) times daily as needed for muscle spasms.     . benztropine (COGENTIN) 1 MG tablet Take 1 mg by mouth 2 (two) times daily.    . clopidogrel (PLAVIX) 75 MG tablet Take 1 tablet (75 mg total) by mouth daily. 17 tablet 0  . Cranberry 425 MG CAPS Take 425 mg by mouth 2 (two) times a day.    . ferrous [id number] C-folic acid (FEROCON) capsule Take 1 capsule by mouth at bedtime.    Marland Kitchen lithium carbonate 150 MG capsule Take 150 mg by mouth 3 (three) times daily with meals.    Marland Kitchen loratadine (CLARITIN) 10 MG tablet Take 10 mg by mouth daily.    Marland Kitchen LORazepam (ATIVAN) 0.5 MG tablet Take 0.5 mg by mouth every 8 (eight)  hours.    Marland Kitchen OVER THE COUNTER MEDICATION Take 1 Bottle by mouth 3 (three) times daily. Mighty shakes    . paliperidone (INVEGA SUSTENNA) 156 MG/ML SUSY injection Inject 156 mg into the muscle every 30 (thirty) days.    . polyethylene glycol powder (GLYCOLAX/MIRALAX) 17 GM/SCOOP powder Take 17 g by mouth daily. Mix in 8 oz of fluid and drink by mouth.    . Pramoxine HCl (VAGISIL ANTI-ITCH MEDICATED) 1 % MISC Apply vaginally once a day Apply 1 application topically daily as needed (for vaginal itching). Apply vaginally once a day (Patient taking  differently: Apply 1 application topically daily as needed (for vaginal itching).) 20 each 0  . Propylene Glycol 0.6 % SOLN Place 1 drop into both eyes 3 (three) times daily.     . sertraline (ZOLOFT) 100 MG tablet Take 100 mg by mouth daily.    . simethicone (MYLICON) 80 MG chewable tablet Chew 1 tablet (80 mg total) by mouth 4 (four) times daily. 30 tablet 0   No current facility-administered medications on file prior to visit.    Allergies:   Allergies  Allergen Reactions  . Sulfonamide Derivatives Hives           OBJECTIVE:  Physical Exam  Vitals:   10/12/20 1410  BP: (!) 142/95  Pulse: 90  Weight: 149 lb (67.6 kg)  Height: 5\' 5"  (1.651 m)   Body mass index is 24.79 kg/m. No exam data present  General: well developed, well nourished,  very pleasant middle-aged African-American female, seated, in no evident distress Head: head normocephalic and atraumatic.   Neck: supple with no carotid or supraclavicular bruits Cardiovascular: regular rate and rhythm, no murmurs Musculoskeletal: no deformity Skin:  no rash/petichiae Vascular:  Normal pulses all extremities   Neurologic Exam Mental Status: Awake and fully alert.   Dysarthria (no dentures present).  Oriented to place and time. Recent and remote memory intact. Attention span, concentration and fund of knowledge appropriate during visit. Mood and affect appropriate and cooperative  with exam.  Cranial Nerves: Fundoscopic exam reveals sharp disc margins. Pupils equal, briskly reactive to light. Extraocular movements full without nystagmus. Visual fields full to confrontation. Hearing intact. Facial sensation intact. Face, tongue, palate moves normally and symmetrically.  Motor: Normal bulk and tone. Normal strength in all tested extremity muscles Sensory.: intact to touch , pinprick , position and vibratory sensation.  Coordination: Rapid alternating movements normal in all extremities. Finger-to-nose and heel-to-shin performed accurately bilaterally. Gait and Station: Arises from chair without difficulty. Stance is normal. Gait demonstrates normal stride length and balance without use of assistive device Reflexes: 1+ and symmetric. Toes downgoing.     NIHSS  0 Modified Rankin  0      ASSESSMENT: Heidi Blevins is a 70 y.o. 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 Vascular risk factors include HTN, HLD, tobacco use, prior stroke on imaging, advanced age and family history of stroke.      PLAN:  1. R lentiform nucleus stroke:  a. Recovered without residual deficit.   b. Continue clopidogrel 75 mg daily  and atorvastatin 40 mg daily for secondary stroke prevention.  Advised to discontinue aspirin as 3 weeks DAPT completed. c. Discussed secondary stroke prevention measures and importance of close PCP follow up for aggressive stroke risk factor management  2. HTN: BP goal <130/90.  Managed/monitored by PCP/facility 3. HLD: LDL goal <70. Recent LDL 120 therefore initiated atorvastatin 40 mg daily during recent admission.  Request follow-up with PCP in the next 1 to 2 months for repeat lipid panel as well as ongoing prescribing of atorvastatin 4. Tobacco use: Continued tobacco use approximately 3 cigarettes/day.  She has no desire  in quitting at this time and is aware of increased risk with continued use    Follow up in 6 months or call earlier if needed   CC:  [facility] provider: Dr. Dorette Grate, MD    I spent 45 minutes of face-to-face and  non-face-to-face time with patient and SNF nurse.  This included previsit chart review including recent hospitalization pertinent progress notes, lab work and imaging, lab review, study review, order entry, electronic health record documentation, patient education regarding recent stroke and etiology,  importance of managing stroke risk factors and answered all other questions to patient satisfaction  Frann Rider, [facility]  [facility] [address] [place], [place] [address]  Phone [phone] Fax [phone] Note: This document was prepared with digital dictation and possible smart phrase technology. Any transcriptional errors that result from this process are unintentional.

## 2021-02-17 ENCOUNTER — Emergency Department (HOSPITAL_COMMUNITY): Payer: Medicare Other

## 2021-02-17 ENCOUNTER — Emergency Department (HOSPITAL_COMMUNITY)
Admission: EM | Admit: 2021-02-17 | Discharge: 2021-02-18 | Disposition: A | Discharge status: Home / Self Care | Payer: Medicare Other | Attending: Emergency Medicine | Admitting: Emergency Medicine

## 2021-02-17 ENCOUNTER — Other Ambulatory Visit: Payer: Self-pay

## 2021-02-17 ENCOUNTER — Encounter (HOSPITAL_COMMUNITY): Payer: Self-pay | Admitting: Emergency Medicine

## 2021-02-17 DIAGNOSIS — F17210 Nicotine dependence, cigarettes, uncomplicated: Secondary | ICD-10-CM | POA: Diagnosis not present

## 2021-02-17 DIAGNOSIS — B9689 Other specified bacterial agents as the cause of diseases classified elsewhere: Secondary | ICD-10-CM | POA: Diagnosis not present

## 2021-02-17 DIAGNOSIS — N390 Urinary tract infection, site not specified: Secondary | ICD-10-CM | POA: Diagnosis not present

## 2021-02-17 DIAGNOSIS — Z96642 Presence of left artificial hip joint: Secondary | ICD-10-CM | POA: Diagnosis not present

## 2021-02-17 DIAGNOSIS — I10 Essential (primary) hypertension: Secondary | ICD-10-CM | POA: Diagnosis not present

## 2021-02-17 DIAGNOSIS — Z7902 Long term (current) use of antithrombotics/antiplatelets: Secondary | ICD-10-CM | POA: Insufficient documentation

## 2021-02-17 DIAGNOSIS — R531 Weakness: Secondary | ICD-10-CM | POA: Diagnosis present

## 2021-02-17 LAB — CBC WITH DIFFERENTIAL/PLATELET
Abs Immature Granulocytes: 0.01 10*3/uL (ref 0.00–0.07)
Basophils Absolute: 0.1 10*3/uL (ref 0.0–0.1)
Basophils Relative: 1 %
Eosinophils Absolute: 0.3 10*3/uL (ref 0.0–0.5)
Eosinophils Relative: 4 %
HCT: 36.4 % (ref 36.0–46.0)
Hemoglobin: 11.6 g/dL — ABNORMAL LOW (ref 12.0–15.0)
Immature Granulocytes: 0 %
Lymphocytes Relative: 32 %
Lymphs Abs: 2.2 10*3/uL (ref 0.7–4.0)
MCH: 31.5 pg (ref 26.0–34.0)
MCHC: 31.9 g/dL (ref 30.0–36.0)
MCV: 98.9 fL (ref 80.0–100.0)
Monocytes Absolute: 0.6 10*3/uL (ref 0.1–1.0)
Monocytes Relative: 8 %
Neutro Abs: 3.8 10*3/uL (ref 1.7–7.7)
Neutrophils Relative %: 55 %
Platelets: 217 10*3/uL (ref 150–400)
RBC: 3.68 MIL/uL — ABNORMAL LOW (ref 3.87–5.11)
RDW: 12.8 % (ref 11.5–15.5)
WBC: 7 10*3/uL (ref 4.0–10.5)
nRBC: 0 % (ref 0.0–0.2)

## 2021-02-17 LAB — URINALYSIS, ROUTINE W REFLEX MICROSCOPIC
Bilirubin Urine: NEGATIVE
Glucose, UA: NEGATIVE mg/dL
Hgb urine dipstick: NEGATIVE
Ketones, ur: NEGATIVE mg/dL
Nitrite: POSITIVE — AB
Protein, ur: NEGATIVE mg/dL
Specific Gravity, Urine: 1.008 (ref 1.005–1.030)
pH: 8 (ref 5.0–8.0)

## 2021-02-17 LAB — COMPREHENSIVE METABOLIC PANEL
ALT: 25 U/L (ref 0–44)
AST: 24 U/L (ref 15–41)
Albumin: 4 g/dL (ref 3.5–5.0)
Alkaline Phosphatase: 95 U/L (ref 38–126)
Anion gap: 6 (ref 5–15)
BUN: 15 mg/dL (ref 8–23)
CO2: 25 mmol/L (ref 22–32)
Calcium: 10.3 mg/dL (ref 8.9–10.3)
Chloride: 108 mmol/L (ref 98–111)
Creatinine, Ser: 0.98 mg/dL (ref 0.44–1.00)
GFR, Estimated: >60 mL/min (ref >60)
Glucose, Bld: 90 mg/dL (ref 70–99)
Potassium: 3.6 mmol/L (ref 3.5–5.1)
Sodium: 139 mmol/L (ref 135–145)
Total Bilirubin: 0.4 mg/dL (ref 0.3–1.2)
Total Protein: 6.9 g/dL (ref 6.5–8.1)

## 2021-02-17 LAB — LITHIUM LEVEL: Lithium Lvl: 0.91 mmol/L (ref 0.60–1.20)

## 2021-02-17 MED ORDER — SODIUM CHLORIDE 0.9 % IV SOLN: INTRAVENOUS | Status: DC

## 2021-02-17 MED ORDER — CEPHALEXIN 500 MG PO CAPS: 500.0000 mg | ORAL_CAPSULE | Freq: Two times a day (BID) | ORAL | 0 refills | Status: DC

## 2021-02-17 MED ORDER — CEPHALEXIN 500 MG PO CAPS
500.0000 mg | ORAL_CAPSULE | Freq: Once | ORAL | Status: AC
  Administered 2021-02-18: 500 mg via ORAL
  Filled 2021-02-17: qty 1

## 2021-02-17 NOTE — ED Notes (Signed)
Called [facility] for transport back to [facility]

## 2021-02-17 NOTE — ED Triage Notes (Signed)
Patient brought in by EMS from [facility] facility for lethargy over the past couple days. Patient currently being treated for a vaginal yeast infection. Per staff patient is at baseline- alert & orientated to self and location, unsure of year or situation. C/o back pain.

## 2021-02-17 NOTE — ED Provider Notes (Signed)
[facility] DEPT Provider Note   CSN: [id number] Arrival date & time: 02/17/21  1737     History Chief Complaint  Patient presents with   Fatigue    Heidi Blevins is a 70 y.o. female.  70 year old female who presents from nursing home with diffuse weakness.  Patient states she initially had some lower back pain as since resolved.  Denies any urinary symptoms.  No fever, vomiting.  No abdominal discomfort.  No shortness of breath.  States she is feeling somewhat better at this point.  No treatment use prior to arrival      Past Medical History:  Diagnosis Date   Anxiety    Arrhythmia    Bifascicular block    Depression    Hypertension    Insomnia    Schizoaffective disorder    Scoliosis    Thyroid nodule 08/09/2020   Vitamin D deficiency     Patient Active Problem List   Diagnosis Date Noted   Polypharmacy 08/09/2020   Acute CVA (cerebrovascular accident) ([place]) 08/09/2020   Thyroid nodule 08/09/2020   CVA (cerebral vascular accident) ([place]) 08/08/2020   Bradycardia    Acute metabolic encephalopathy    Sepsis secondary to UTI ([place]) 03/20/2019   Closed displaced fracture of left femoral neck ([place]) 09/26/2018   AKI (acute kidney injury) ([place]) 06/13/2016   UTI (urinary tract infection) 06/13/2016   Essential hypertension 06/13/2016   Urinary tract infection without hematuria    Complicated UTI (urinary tract infection) 05/03/2016   Hypercalcemia 05/03/2016   Dehydration 05/03/2016   Acute encephalopathy 05/03/2016   Cerebrovascular disease 05/03/2016   Schizoaffective disorder ([place]) 05/03/2016   Bifascicular block 05/03/2016    Past Surgical History:  Procedure Laterality Date   BACK SURGERY     "as a child after she fell out of a tree"   TONSILLECTOMY     TOTAL HIP ARTHROPLASTY Left 09/27/2018   Procedure: LEFT TOTAL HIP ARTHROPLASTY ANTERIOR APPROACH;  Surgeon: Leandrew Koyanagi, MD;  Location: [facility];  Service: Orthopedics;   Laterality: Left;     OB History   No obstetric history on file.     Family History  Problem Relation Age of Onset   CVA Mother    Hypertension Mother    Lupus Sister    Hypertension Brother    Peptic Ulcer Disease Father    Colon cancer Neg Hx     Social History   Tobacco Use   Smoking status: Every Day    Packs/day: 0.25    Years: 40.00    Pack years: 10.00    Types: Cigarettes   Smokeless tobacco: Never  Substance Use Topics   Alcohol use: No   Drug use: No    Home Medications Prior to Admission medications   Medication Sig Start Date End Date Taking? Authorizing Provider  acetaminophen (TYLENOL) 325 MG tablet Take 650 mg by mouth every 4 (four) hours as needed for mild pain, moderate pain, fever or headache.    [provider]  atorvastatin (LIPITOR) 40 MG tablet Take 1 tablet (40 mg total) by mouth daily. 08/13/20   Lavina Hamman, MD  baclofen (LIORESAL) 20 MG tablet Take 20 mg by mouth 3 (three) times daily as needed for muscle spasms.     [provider]  benztropine (COGENTIN) 1 MG tablet Take 1 mg by mouth 2 (two) times daily.    [provider]  clopidogrel (PLAVIX) 75 MG tablet Take 1 tablet (75  mg total) by mouth daily. 08/13/20   Lavina Hamman, MD  Cranberry 425 MG CAPS Take 425 mg by mouth 2 (two) times a day.    [provider]  ferrous [id number] C-folic acid (FEROCON) capsule Take 1 capsule by mouth at bedtime.    [provider]  lithium carbonate 150 MG capsule Take 150 mg by mouth 3 (three) times daily with meals.    [provider]  loratadine (CLARITIN) 10 MG tablet Take 10 mg by mouth daily.    [provider]  LORazepam (ATIVAN) 0.5 MG tablet Take 0.5 mg by mouth every 8 (eight) hours.    [provider]  OVER THE COUNTER MEDICATION Take 1 Bottle by mouth 3 (three) times daily. Mighty shakes    [provider]  paliperidone (INVEGA SUSTENNA) 156 MG/ML  SUSY injection Inject 156 mg into the muscle every 30 (thirty) days.    [provider]  polyethylene glycol powder (GLYCOLAX/MIRALAX) 17 GM/SCOOP powder Take 17 g by mouth daily. Mix in 8 oz of fluid and drink by mouth.    [provider]  Pramoxine HCl (VAGISIL ANTI-ITCH MEDICATED) 1 % MISC Apply vaginally once a day Apply 1 application topically daily as needed (for vaginal itching). Apply vaginally once a day Patient taking differently: Apply 1 application topically daily as needed (for vaginal itching). 03/25/19   Eugenie Filler, MD  Propylene Glycol 0.6 % SOLN Place 1 drop into both eyes 3 (three) times daily.     [provider]  sertraline (ZOLOFT) 100 MG tablet Take 100 mg by mouth daily.    [provider]  simethicone (MYLICON) 80 MG chewable tablet Chew 1 tablet (80 mg total) by mouth 4 (four) times daily. 08/13/20   Lavina Hamman, MD    Allergies    Sulfonamide derivatives  Review of Systems   Review of Systems  All other systems reviewed and are negative.  Physical Exam Updated Vital Signs BP 139/81 (BP Location: Left Arm)   Pulse 66   Temp 98.4 F (36.9 C) (Oral)   Resp 16   SpO2 96%   Physical Exam Vitals and nursing note reviewed.  Constitutional:      General: She is not in acute distress.    Appearance: Normal appearance. She is well-developed. She is not toxic-appearing.  HENT:     Head: Normocephalic and atraumatic.  Eyes:     General: Lids are normal.     Conjunctiva/sclera: Conjunctivae normal.     Pupils: Pupils are equal, round, and reactive to light.  Neck:     Thyroid: No thyroid mass.     Trachea: No tracheal deviation.  Cardiovascular:     Rate and Rhythm: Normal rate and regular rhythm.     Heart sounds: Normal heart sounds. No murmur heard.   No gallop.  Pulmonary:     Effort: Pulmonary effort is normal. No respiratory distress.     Breath sounds: Normal breath sounds. No stridor. No decreased breath  sounds, wheezing, rhonchi or rales.  Abdominal:     General: There is no distension.     Palpations: Abdomen is soft.     Tenderness: There is no abdominal tenderness. There is no rebound.  Musculoskeletal:        General: No tenderness. Normal range of motion.     Cervical back: Normal range of motion and neck supple.  Skin:    General: Skin is warm and dry.  Findings: No abrasion or rash.  Neurological:     General: No focal deficit present.     Mental Status: She is alert and oriented to person, place, and time. Mental status is at baseline.     GCS: GCS eye subscore is 4. GCS verbal subscore is 5. GCS motor subscore is 6.     Cranial Nerves: Cranial nerves are intact. No cranial nerve deficit.     Sensory: No sensory deficit.     Motor: Motor function is intact.  Psychiatric:        Attention and Perception: Attention normal.        Mood and Affect: Affect is flat.        Speech: Speech normal.        Behavior: Behavior is withdrawn.    ED Results / Procedures / Treatments   Labs (all labs ordered are listed, but only abnormal results are displayed) Labs Reviewed  URINE CULTURE  CBC WITH DIFFERENTIAL/PLATELET  COMPREHENSIVE METABOLIC PANEL  URINALYSIS, ROUTINE W REFLEX MICROSCOPIC  LITHIUM LEVEL    EKG None  Radiology No results found.  Procedures Procedures   Medications Ordered in ED Medications  0.9 %  sodium chloride infusion (has no administration in time range)    ED Course  I have reviewed the triage vital signs and the nursing notes.  Pertinent labs & imaging results that were available during my care of the patient were reviewed by me and considered in my medical decision making (see chart for details).    MDM Rules/Calculators/A&P                         Pt to be tx for uti Final Clinical Impression(s) / ED Diagnoses Final diagnoses:  None    Rx / DC Orders ED Discharge Orders     None        Lacretia Leigh, MD 02/22/21  1111

## 2021-02-18 ENCOUNTER — Telehealth (HOSPITAL_COMMUNITY): Payer: Self-pay | Admitting: Emergency Medicine

## 2021-02-18 DIAGNOSIS — N390 Urinary tract infection, site not specified: Secondary | ICD-10-CM | POA: Diagnosis not present

## 2021-02-18 NOTE — ED Notes (Signed)
Provided pt with water and a warm blanket. Call bell at bedside.

## 2021-02-19 LAB — URINE CULTURE: Culture: <10000 — AB

## 2021-04-13 ENCOUNTER — Encounter: Payer: Self-pay | Admitting: Adult Health

## 2021-04-13 ENCOUNTER — Ambulatory Visit: Payer: Medicare Other | Admitting: Adult Health

## 2022-04-22 ENCOUNTER — Encounter (HOSPITAL_COMMUNITY): Payer: Self-pay | Admitting: *Deleted

## 2022-04-22 ENCOUNTER — Other Ambulatory Visit: Payer: Self-pay

## 2022-04-22 ENCOUNTER — Emergency Department (HOSPITAL_COMMUNITY): Payer: Medicare Other

## 2022-04-22 ENCOUNTER — Inpatient Hospital Stay (HOSPITAL_COMMUNITY)
Admission: EM | Admit: 2022-04-22 | Discharge: 2022-04-25 | DRG: 640 | Disposition: A | Discharge status: Home Health | Payer: Medicare Other | Attending: Internal Medicine | Admitting: Internal Medicine

## 2022-04-22 DIAGNOSIS — Z7902 Long term (current) use of antithrombotics/antiplatelets: Secondary | ICD-10-CM

## 2022-04-22 DIAGNOSIS — Z79899 Other long term (current) drug therapy: Secondary | ICD-10-CM

## 2022-04-22 DIAGNOSIS — N3001 Acute cystitis with hematuria: Principal | ICD-10-CM

## 2022-04-22 DIAGNOSIS — N3000 Acute cystitis without hematuria: Secondary | ICD-10-CM | POA: Diagnosis present

## 2022-04-22 DIAGNOSIS — E860 Dehydration: Principal | ICD-10-CM | POA: Diagnosis present

## 2022-04-22 DIAGNOSIS — I452 Bifascicular block: Secondary | ICD-10-CM | POA: Diagnosis present

## 2022-04-22 DIAGNOSIS — Z823 Family history of stroke: Secondary | ICD-10-CM

## 2022-04-22 DIAGNOSIS — M419 Scoliosis, unspecified: Secondary | ICD-10-CM | POA: Diagnosis present

## 2022-04-22 DIAGNOSIS — I5032 Chronic diastolic (congestive) heart failure: Secondary | ICD-10-CM | POA: Diagnosis present

## 2022-04-22 DIAGNOSIS — Z832 Family history of diseases of the blood and blood-forming organs and certain disorders involving the immune mechanism: Secondary | ICD-10-CM

## 2022-04-22 DIAGNOSIS — F17210 Nicotine dependence, cigarettes, uncomplicated: Secondary | ICD-10-CM | POA: Diagnosis present

## 2022-04-22 DIAGNOSIS — Z882 Allergy status to sulfonamides status: Secondary | ICD-10-CM

## 2022-04-22 DIAGNOSIS — B964 Proteus (mirabilis) (morganii) as the cause of diseases classified elsewhere: Secondary | ICD-10-CM | POA: Diagnosis present

## 2022-04-22 DIAGNOSIS — I10 Essential (primary) hypertension: Secondary | ICD-10-CM | POA: Diagnosis present

## 2022-04-22 DIAGNOSIS — I110 Hypertensive heart disease with heart failure: Secondary | ICD-10-CM | POA: Diagnosis present

## 2022-04-22 DIAGNOSIS — Z8249 Family history of ischemic heart disease and other diseases of the circulatory system: Secondary | ICD-10-CM

## 2022-04-22 DIAGNOSIS — Z87440 Personal history of urinary (tract) infections: Secondary | ICD-10-CM

## 2022-04-22 DIAGNOSIS — F259 Schizoaffective disorder, unspecified: Secondary | ICD-10-CM | POA: Diagnosis present

## 2022-04-22 DIAGNOSIS — R4182 Altered mental status, unspecified: Secondary | ICD-10-CM

## 2022-04-22 DIAGNOSIS — Z96642 Presence of left artificial hip joint: Secondary | ICD-10-CM | POA: Diagnosis present

## 2022-04-22 DIAGNOSIS — G9341 Metabolic encephalopathy: Secondary | ICD-10-CM | POA: Diagnosis present

## 2022-04-22 DIAGNOSIS — N179 Acute kidney failure, unspecified: Secondary | ICD-10-CM | POA: Diagnosis present

## 2022-04-22 DIAGNOSIS — N390 Urinary tract infection, site not specified: Secondary | ICD-10-CM | POA: Diagnosis present

## 2022-04-22 DIAGNOSIS — E785 Hyperlipidemia, unspecified: Secondary | ICD-10-CM | POA: Diagnosis present

## 2022-04-22 DIAGNOSIS — F411 Generalized anxiety disorder: Secondary | ICD-10-CM | POA: Diagnosis present

## 2022-04-22 HISTORY — DX: Chronic diastolic (congestive) heart failure: I50.32

## 2022-04-22 LAB — URINALYSIS, ROUTINE W REFLEX MICROSCOPIC
Bilirubin Urine: NEGATIVE
Glucose, UA: NEGATIVE mg/dL
Hgb urine dipstick: NEGATIVE
Ketones, ur: 5 mg/dL — AB
Nitrite: NEGATIVE
Protein, ur: >=300 mg/dL — AB
RBC / HPF: >50 RBC/hpf — ABNORMAL HIGH (ref 0–5)
Specific Gravity, Urine: 1.012 (ref 1.005–1.030)
WBC, UA: >50 WBC/hpf — ABNORMAL HIGH (ref 0–5)
pH: 8 (ref 5.0–8.0)

## 2022-04-22 LAB — CBC WITH DIFFERENTIAL/PLATELET
Abs Immature Granulocytes: 0.04 10*3/uL (ref 0.00–0.07)
Basophils Absolute: 0.1 10*3/uL (ref 0.0–0.1)
Basophils Relative: 1 %
Eosinophils Absolute: 0.3 10*3/uL (ref 0.0–0.5)
Eosinophils Relative: 3 %
HCT: 40.9 % (ref 36.0–46.0)
Hemoglobin: 12.8 g/dL (ref 12.0–15.0)
Immature Granulocytes: 0 %
Lymphocytes Relative: 28 %
Lymphs Abs: 2.5 10*3/uL (ref 0.7–4.0)
MCH: 31.2 pg (ref 26.0–34.0)
MCHC: 31.3 g/dL (ref 30.0–36.0)
MCV: 99.8 fL (ref 80.0–100.0)
Monocytes Absolute: 0.8 10*3/uL (ref 0.1–1.0)
Monocytes Relative: 9 %
Neutro Abs: 5.3 10*3/uL (ref 1.7–7.7)
Neutrophils Relative %: 59 %
Platelets: 300 10*3/uL (ref 150–400)
RBC: 4.1 MIL/uL (ref 3.87–5.11)
RDW: 12.7 % (ref 11.5–15.5)
WBC: 8.9 10*3/uL (ref 4.0–10.5)
nRBC: 0 % (ref 0.0–0.2)

## 2022-04-22 LAB — CBG MONITORING, ED
Glucose-Capillary: 102 mg/dL — ABNORMAL HIGH (ref 70–99)
Glucose-Capillary: 97 mg/dL (ref 70–99)

## 2022-04-22 MED ORDER — CEFTRIAXONE SODIUM 1 G IJ SOLR
1.0000 g | Freq: Once | INTRAMUSCULAR | Status: AC
Start: 2022-04-22 — End: 2022-04-23
  Administered 2022-04-23: 1 g via INTRAVENOUS
  Filled 2022-04-22: qty 10

## 2022-04-22 MED ORDER — SODIUM CHLORIDE 0.9 % IV BOLUS
500.0000 mL | Freq: Once | INTRAVENOUS | Status: AC
Start: 2022-04-22 — End: 2022-04-23
  Administered 2022-04-23: 500 mL via INTRAVENOUS

## 2022-04-22 NOTE — ED Provider Notes (Incomplete)
[facility] EMERGENCY DEPARTMENT Provider Note   CSN: [id number] Arrival date & time: 04/22/22  1946     History {Add pertinent medical, surgical, social history, OB history to HPI:1} Chief Complaint  Patient presents with  . Fatigue    Heidi Blevins is a 71 y.o. female.  Patient with history of UTI, schizophrenia, hypertension presents today from [facility] skilled nursing facility with complaints of altered mental status. Patient presents with son who provides most of the history. According to son, patient is normally alert and oriented and ambulatory without assistance. She receives monthly injections of Paliperidone for her schizophrenia, and normally in the following 2 days after she gets these injections she is more lethargic and confused and doesn't eat, however after those 2 days pass she normalizes again. However, this time it has been 2 weeks since her injection and she is still much less with it than normal. She has not been eating, has been barely walking with a 'shuffling gait' and is more confused than normal. Son states this is similar to when she has had complicated UTIs in the past and he is concerned for same.   The history is provided by the patient. No language interpreter was used.       Home Medications Prior to Admission medications   Medication Sig Start Date End Date Taking? Authorizing Provider  acetaminophen (TYLENOL) 325 MG tablet Take 650 mg by mouth every 4 (four) hours as needed for mild pain, moderate pain, fever or headache.    [provider]  atorvastatin (LIPITOR) 40 MG tablet Take 1 tablet (40 mg total) by mouth daily. Patient taking differently: Take 40 mg by mouth at bedtime. 08/13/20   Lavina Hamman, MD  benztropine (COGENTIN) 1 MG tablet Take 1 mg by mouth 2 (two) times daily.    [provider]  clopidogrel (PLAVIX) 75 MG tablet Take 1 tablet (75 mg total) by mouth daily. 08/13/20   Lavina Hamman, MD  Cranberry 425 MG CAPS Take 425 mg by mouth 2 (two) times a day.    [provider]  ferrous [id number] C-folic acid (FEROCON) capsule Take 1 capsule by mouth at bedtime.    [provider]  fluPHENAZine (PROLIXIN) 10 MG tablet Take 20 mg by mouth daily.    [provider]  Hydrocortisone Acetate 1 % CREA Apply 1 application topically daily as needed (vaginal area for itching).    [provider]  lithium carbonate 150 MG capsule Take 150-300 mg by mouth See admin instructions. Takes 2 capsules by mouth during the day and 1 capsule at bedtime    [provider]  loratadine (CLARITIN) 10 MG tablet Take 10 mg by mouth daily.    [provider]  LORazepam (ATIVAN) 0.5 MG tablet Take 0.5 mg by mouth in the morning, at noon, in the evening, and at bedtime.    [provider]  Lurasidone HCl (LATUDA) 120 MG TABS Take 120 mg by mouth daily.    [provider]  melatonin 5 MG TABS Take 5 mg by mouth at bedtime as needed (sleep).    [provider]  Multiple Vitamins-Minerals (MULTIVITAMINS THER. W/MINERALS) TABS tablet Take 1 tablet by mouth daily.    [provider]  OVER THE COUNTER MEDICATION Take 1 Bottle by mouth 3 (three) times daily. Mighty shakes    [provider]  paliperidone (INVEGA SUSTENNA) 156 MG/ML SUSY injection Inject 156 mg into the muscle  every 30 (thirty) days.    [provider]  polyethylene glycol powder (GLYCOLAX/MIRALAX) 17 GM/SCOOP powder Take 17 g by mouth daily.    [provider]  Pramoxine HCl (VAGISIL ANTI-ITCH MEDICATED) 1 % MISC Apply vaginally once a day Apply 1 application topically daily as needed (for vaginal itching). Apply vaginally once a day Patient taking differently: Apply 1 application topically daily as needed (for vaginal itching). 03/25/19   Eugenie Filler, MD  Propylene Glycol (SYSTANE BALANCE OP) Place 1 drop into both eyes 3  (three) times daily.    [provider]  sertraline (ZOLOFT) 100 MG tablet Take 100 mg by mouth daily.    [provider]  simethicone (MYLICON) 80 MG chewable tablet Chew 1 tablet (80 mg total) by mouth 4 (four) times daily. 08/13/20   Lavina Hamman, MD      Allergies    Sulfonamide derivatives    Review of Systems   Review of Systems  Psychiatric/Behavioral:  Positive for confusion.   All other systems reviewed and are negative.   Physical Exam Updated Vital Signs BP 104/69 (BP Location: Right Arm)   Pulse 82   Temp 98.8 F (37.1 C) (Oral)   Resp 17   Ht '5\' 5"'$  (1.651 m)   Wt 67.6 kg   SpO2 100%   BMI 24.80 kg/m  Physical Exam Vitals and nursing note reviewed.  Constitutional:      General: She is not in acute distress.    Appearance: Normal appearance. She is normal weight. She is not ill-appearing, toxic-appearing or diaphoretic.  HENT:     Head: Normocephalic and atraumatic.  Eyes:     Extraocular Movements: Extraocular movements intact.     Pupils: Pupils are equal, round, and reactive to light.  Cardiovascular:     Rate and Rhythm: Normal rate and regular rhythm.     Pulses:          Dorsalis pedis pulses are 2+ on the right side and 2+ on the left side.       Posterior tibial pulses are 2+ on the right side and 2+ on the left side.     Heart sounds: Normal heart sounds.  Pulmonary:     Effort: Pulmonary effort is normal. No respiratory distress.     Breath sounds: Normal breath sounds.  Abdominal:     General: Abdomen is flat.     Palpations: Abdomen is soft.  Musculoskeletal:        General: Normal range of motion.     Cervical back: Normal range of motion.     Right lower leg: No edema.     Left lower leg: No edema.  Skin:    General: Skin is warm and dry.  Neurological:     General: No focal deficit present.     Mental Status: She is alert.     Comments: Patient oriented to self only, is awake and alert and moving all extremities  equally and following commands   Psychiatric:        Mood and Affect: Mood normal.        Behavior: Behavior normal.     ED Results / Procedures / Treatments   Labs (all labs ordered are listed, but only abnormal results are displayed) Labs Reviewed  URINALYSIS, ROUTINE W REFLEX MICROSCOPIC - Abnormal; Notable for the following components:      Result Value   Color, Urine AMBER (*)    APPearance CLOUDY (*)  Ketones, ur 5 (*)    Protein, ur >=300 (*)    Leukocytes,Ua MODERATE (*)    RBC / HPF >50 (*)    WBC, UA >50 (*)    Bacteria, UA MANY (*)    All other components within normal limits  CBG MONITORING, ED - Abnormal; Notable for the following components:   Glucose-Capillary 102 (*)    All other components within normal limits  URINE CULTURE  CBC WITH DIFFERENTIAL/PLATELET  COMPREHENSIVE METABOLIC PANEL  LACTIC ACID, PLASMA  CBG MONITORING, ED    EKG None  Radiology DG Chest Portable 1 View  Result Date: 04/22/2022 CLINICAL DATA:  Altered mental status EXAM: PORTABLE CHEST 1 VIEW COMPARISON:  02/17/2021 FINDINGS: Severe thoracolumbar scoliosis with posterior spinal rods. Heart is normal size. Tortuous aorta. Lungs clear. No effusions or acute bony abnormality. IMPRESSION: No active disease. Electronically Signed   By: Rolm Baptise M.D.   On: 04/22/2022 22:25   CT HEAD WO CONTRAST  Result Date: 04/22/2022 CLINICAL DATA:  Altered mental status EXAM: CT HEAD WITHOUT CONTRAST TECHNIQUE: Contiguous axial images were obtained from the base of the skull through the vertex without intravenous contrast. RADIATION DOSE REDUCTION: This exam was performed according to the departmental dose-optimization program which includes automated exposure control, adjustment of the mA and/or kV according to patient size and/or use of iterative reconstruction technique. COMPARISON:  02/17/2021 FINDINGS: Brain: No evidence of acute infarction, hemorrhage, hydrocephalus, extra-axial collection or  mass lesion/mass effect. Subcortical white matter and periventricular small vessel ischemic changes. Vascular: Intracranial atherosclerosis. Skull: Normal. Negative for fracture or focal lesion. Sinuses/Orbits: The visualized paranasal sinuses are essentially clear. The mastoid air cells are unopacified. Other: None. IMPRESSION: No acute intracranial abnormality. Small vessel ischemic changes. Electronically Signed   By: Julian Hy M.D.   On: 04/22/2022 22:18    Procedures Procedures  {Document cardiac monitor, telemetry assessment procedure when appropriate:1}  Medications Ordered in ED Medications  cefTRIAXone (ROCEPHIN) 1 g in sodium chloride 0.9 % 100 mL IVPB (has no administration in time range)  sodium chloride 0.9 % bolus 500 mL (has no administration in time range)    ED Course/ Medical Decision Making/ A&P                           Medical Decision Making Amount and/or Complexity of Data Reviewed Labs: ordered. Radiology: ordered.   This patient presents to the ED for concern of AMS, this involves an extensive number of treatment options, and is a complaint that carries with it a high risk of complications and morbidity.  The differential diagnosis includes sepsis, UTI, ACS/CVA, neoplasm, generalized deconditioning. This is not an exhaustive differential   Co morbidities that complicate the patient evaluation  Hx schizophrenia and complicated UTIs   Additional history obtained:  Additional history obtained from epic chart review   Lab Tests:  I Ordered, and personally interpreted labs.  The pertinent results include:  no leukocytosis, UA shows proteinuria, moderate leukocytes, >50 RBCs, >50 WBCs, many bacteria   Imaging Studies ordered:  I ordered imaging studies including CT head, DG chest I independently visualized and interpreted imaging which showed  CT head: no acute findings DG chest: no acute findings I agree with the radiologist  interpretation   Cardiac Monitoring: / EKG:  The patient was maintained on a cardiac monitor.  I personally viewed and interpreted the cardiac monitored which showed an underlying rhythm of: no STEMI, RBBB, unchanged from  previous   Problem List / ED Course / Critical interventions / Medication management  UTI I ordered medication including rocephin  for UTI, fluids for dehydration  Reevaluation of the patient after these medicines showed that the patient stayed the same I have reviewed the patients home medicines and have made adjustments as needed   Test / Admission - Considered:  Patient presents today from her nursing facility for altered mental status. She is afebrile, non-to   {Document critical care time when appropriate:1} {Document review of labs and clinical decision tools ie heart score, Chads2Vasc2 etc:1}  {Document your independent review of radiology images, and any outside records:1} {Document your discussion with family members, caretakers, and with consultants:1} {Document social determinants of health affecting pt's care:1} {Document your decision making why or why not admission, treatments were needed:1} Final Clinical Impression(s) / ED Diagnoses Final diagnoses:  None    Rx / DC Orders ED Discharge Orders     None

## 2022-04-22 NOTE — ED Provider Notes (Signed)
[facility] EMERGENCY DEPARTMENT Provider Note   CSN: [id number] Arrival date & time: 04/22/22  1946     History {Add pertinent medical, surgical, social history, OB history to HPI:1} Chief Complaint  Patient presents with  . Fatigue    Heidi Blevins is a 71 y.o. female.  HPI     Home Medications Prior to Admission medications   Medication Sig Start Date End Date Taking? Authorizing Provider  acetaminophen (TYLENOL) 325 MG tablet Take 650 mg by mouth every 4 (four) hours as needed for mild pain, moderate pain, fever or headache.    [provider]  atorvastatin (LIPITOR) 40 MG tablet Take 1 tablet (40 mg total) by mouth daily. Patient taking differently: Take 40 mg by mouth at bedtime. 08/13/20   Lavina Hamman, MD  benztropine (COGENTIN) 1 MG tablet Take 1 mg by mouth 2 (two) times daily.    [provider]  clopidogrel (PLAVIX) 75 MG tablet Take 1 tablet (75 mg total) by mouth daily. 08/13/20   Lavina Hamman, MD  Cranberry 425 MG CAPS Take 425 mg by mouth 2 (two) times a day.    [provider]  ferrous [id number] C-folic acid (FEROCON) capsule Take 1 capsule by mouth at bedtime.    [provider]  fluPHENAZine (PROLIXIN) 10 MG tablet Take 20 mg by mouth daily.    [provider]  Hydrocortisone Acetate 1 % CREA Apply 1 application topically daily as needed (vaginal area for itching).    [provider]  lithium carbonate 150 MG capsule Take 150-300 mg by mouth See admin instructions. Takes 2 capsules by mouth during the day and 1 capsule at bedtime    [provider]  loratadine (CLARITIN) 10 MG tablet Take 10 mg by mouth daily.    [provider]  LORazepam (ATIVAN) 0.5 MG tablet Take 0.5 mg by mouth in the morning, at noon, in the evening, and at bedtime.    [provider]  Lurasidone HCl (LATUDA) 120 MG TABS Take 120 mg by mouth daily.    [provider]  melatonin 5 MG TABS Take 5 mg by mouth at bedtime as needed (sleep).    [provider]  Multiple Vitamins-Minerals (MULTIVITAMINS THER. W/MINERALS) TABS tablet Take 1 tablet by mouth daily.    [provider]  OVER THE COUNTER MEDICATION Take 1 Bottle by mouth 3 (three) times daily. Mighty shakes    [provider]  paliperidone (INVEGA SUSTENNA) 156 MG/ML SUSY injection Inject 156 mg into the muscle every 30 (thirty) days.    [provider]  polyethylene glycol powder (GLYCOLAX/MIRALAX) 17 GM/SCOOP powder Take 17 g by mouth daily.    [provider]  Pramoxine HCl (VAGISIL ANTI-ITCH MEDICATED) 1 % MISC Apply vaginally once a day Apply 1 application topically daily as needed (for vaginal itching). Apply vaginally once a day Patient taking differently: Apply 1 application topically daily as needed (for vaginal itching). 03/25/19   Eugenie Filler, MD  Propylene Glycol (SYSTANE BALANCE OP) Place 1 drop into both eyes 3 (three) times daily.    [provider]  sertraline (ZOLOFT) 100 MG tablet Take 100 mg by mouth daily.    [provider]  simethicone (MYLICON) 80 MG chewable tablet Chew 1 tablet (80 mg total) by mouth 4 (four) times daily. 08/13/20   Lavina Hamman, MD      Allergies    Sulfonamide derivatives  Review of Systems   Review of Systems  Physical Exam Updated Vital Signs BP 104/69 (BP Location: Right Arm)   Pulse 82   Temp 98.8 F (37.1 C) (Oral)   Resp 17   Ht '5\' 5"'$  (1.651 m)   Wt 67.6 kg   SpO2 100%   BMI 24.80 kg/m  Physical Exam  ED Results / Procedures / Treatments   Labs (all labs ordered are listed, but only abnormal results are displayed) Labs Reviewed  CBG MONITORING, ED - Abnormal; Notable for the following components:      Result Value   Glucose-Capillary 102 (*)    All other components within normal limits  COMPREHENSIVE METABOLIC PANEL  CBC WITH DIFFERENTIAL/PLATELET   URINALYSIS, ROUTINE W REFLEX MICROSCOPIC  LACTIC ACID, PLASMA    EKG None  Radiology No results found.  Procedures Procedures  {Document cardiac monitor, telemetry assessment procedure when appropriate:1}  Medications Ordered in ED Medications - No data to display  ED Course/ Medical Decision Making/ A&P                           Medical Decision Making Amount and/or Complexity of Data Reviewed Labs: ordered. Radiology: ordered.   ***  {Document critical care time when appropriate:1} {Document review of labs and clinical decision tools ie heart score, Chads2Vasc2 etc:1}  {Document your independent review of radiology images, and any outside records:1} {Document your discussion with family members, caretakers, and with consultants:1} {Document social determinants of health affecting pt's care:1} {Document your decision making why or why not admission, treatments were needed:1} Final Clinical Impression(s) / ED Diagnoses Final diagnoses:  None    Rx / DC Orders ED Discharge Orders     None

## 2022-04-22 NOTE — ED Triage Notes (Signed)
Pt here via [facility] from [place] for increased weakness over the last 2 weeks.  Family believes pt has less appetite and is becoming lethargic and that it is r/t a shot that she receives once a month.  EMS stated that pt is normally able to ambulate and is alert to self,  but lately, she has been "shuffling".  She was able to ambulate with assistance to the stretcher with [facility].

## 2022-04-23 ENCOUNTER — Encounter (HOSPITAL_COMMUNITY): Payer: Self-pay | Admitting: Internal Medicine

## 2022-04-23 DIAGNOSIS — Z882 Allergy status to sulfonamides status: Secondary | ICD-10-CM | POA: Diagnosis not present

## 2022-04-23 DIAGNOSIS — N3001 Acute cystitis with hematuria: Secondary | ICD-10-CM | POA: Diagnosis not present

## 2022-04-23 DIAGNOSIS — N390 Urinary tract infection, site not specified: Secondary | ICD-10-CM | POA: Diagnosis present

## 2022-04-23 DIAGNOSIS — N179 Acute kidney failure, unspecified: Secondary | ICD-10-CM

## 2022-04-23 DIAGNOSIS — F259 Schizoaffective disorder, unspecified: Secondary | ICD-10-CM

## 2022-04-23 DIAGNOSIS — M419 Scoliosis, unspecified: Secondary | ICD-10-CM | POA: Diagnosis present

## 2022-04-23 DIAGNOSIS — E785 Hyperlipidemia, unspecified: Secondary | ICD-10-CM | POA: Diagnosis present

## 2022-04-23 DIAGNOSIS — Z823 Family history of stroke: Secondary | ICD-10-CM | POA: Diagnosis not present

## 2022-04-23 DIAGNOSIS — I110 Hypertensive heart disease with heart failure: Secondary | ICD-10-CM | POA: Diagnosis present

## 2022-04-23 DIAGNOSIS — G9341 Metabolic encephalopathy: Secondary | ICD-10-CM

## 2022-04-23 DIAGNOSIS — E860 Dehydration: Secondary | ICD-10-CM | POA: Diagnosis present

## 2022-04-23 DIAGNOSIS — I10 Essential (primary) hypertension: Secondary | ICD-10-CM

## 2022-04-23 DIAGNOSIS — F411 Generalized anxiety disorder: Secondary | ICD-10-CM

## 2022-04-23 DIAGNOSIS — Z96642 Presence of left artificial hip joint: Secondary | ICD-10-CM | POA: Diagnosis present

## 2022-04-23 DIAGNOSIS — Z832 Family history of diseases of the blood and blood-forming organs and certain disorders involving the immune mechanism: Secondary | ICD-10-CM | POA: Diagnosis not present

## 2022-04-23 DIAGNOSIS — I5032 Chronic diastolic (congestive) heart failure: Secondary | ICD-10-CM | POA: Diagnosis present

## 2022-04-23 DIAGNOSIS — B964 Proteus (mirabilis) (morganii) as the cause of diseases classified elsewhere: Secondary | ICD-10-CM | POA: Diagnosis present

## 2022-04-23 DIAGNOSIS — Z79899 Other long term (current) drug therapy: Secondary | ICD-10-CM | POA: Diagnosis not present

## 2022-04-23 DIAGNOSIS — Z87440 Personal history of urinary (tract) infections: Secondary | ICD-10-CM | POA: Diagnosis not present

## 2022-04-23 DIAGNOSIS — Z8249 Family history of ischemic heart disease and other diseases of the circulatory system: Secondary | ICD-10-CM | POA: Diagnosis not present

## 2022-04-23 DIAGNOSIS — F17210 Nicotine dependence, cigarettes, uncomplicated: Secondary | ICD-10-CM | POA: Diagnosis present

## 2022-04-23 DIAGNOSIS — N3000 Acute cystitis without hematuria: Secondary | ICD-10-CM | POA: Diagnosis present

## 2022-04-23 DIAGNOSIS — Z7902 Long term (current) use of antithrombotics/antiplatelets: Secondary | ICD-10-CM | POA: Diagnosis not present

## 2022-04-23 DIAGNOSIS — I452 Bifascicular block: Secondary | ICD-10-CM | POA: Diagnosis present

## 2022-04-23 LAB — CBC WITH DIFFERENTIAL/PLATELET
Abs Immature Granulocytes: 0.08 10*3/uL — ABNORMAL HIGH (ref 0.00–0.07)
Basophils Absolute: 0.1 10*3/uL (ref 0.0–0.1)
Basophils Relative: 1 %
Eosinophils Absolute: 0.3 10*3/uL (ref 0.0–0.5)
Eosinophils Relative: 3 %
HCT: 41 % (ref 36.0–46.0)
Hemoglobin: 12.9 g/dL (ref 12.0–15.0)
Immature Granulocytes: 1 %
Lymphocytes Relative: 28 %
Lymphs Abs: 3.1 10*3/uL (ref 0.7–4.0)
MCH: 31.2 pg (ref 26.0–34.0)
MCHC: 31.5 g/dL (ref 30.0–36.0)
MCV: 99 fL (ref 80.0–100.0)
Monocytes Absolute: 1 10*3/uL (ref 0.1–1.0)
Monocytes Relative: 10 %
Neutro Abs: 6.3 10*3/uL (ref 1.7–7.7)
Neutrophils Relative %: 57 %
Platelets: 299 10*3/uL (ref 150–400)
RBC: 4.14 MIL/uL (ref 3.87–5.11)
RDW: 12.6 % (ref 11.5–15.5)
WBC: 10.9 10*3/uL — ABNORMAL HIGH (ref 4.0–10.5)
nRBC: 0 % (ref 0.0–0.2)

## 2022-04-23 LAB — COMPREHENSIVE METABOLIC PANEL
ALT: 24 U/L (ref 0–44)
ALT: 28 U/L (ref 0–44)
AST: 23 U/L (ref 15–41)
AST: 28 U/L (ref 15–41)
Albumin: 3.4 g/dL — ABNORMAL LOW (ref 3.5–5.0)
Albumin: 3.8 g/dL (ref 3.5–5.0)
Alkaline Phosphatase: 103 U/L (ref 38–126)
Alkaline Phosphatase: 120 U/L (ref 38–126)
Anion gap: 7 (ref 5–15)
Anion gap: 7 (ref 5–15)
BUN: 11 mg/dL (ref 8–23)
BUN: 10 mg/dL (ref 8–23)
CO2: 22 mmol/L (ref 22–32)
CO2: 22 mmol/L (ref 22–32)
Calcium: 10.6 mg/dL — ABNORMAL HIGH (ref 8.9–10.3)
Calcium: 10.4 mg/dL — ABNORMAL HIGH (ref 8.9–10.3)
Chloride: 111 mmol/L (ref 98–111)
Chloride: 110 mmol/L (ref 98–111)
Creatinine, Ser: 1.21 mg/dL — ABNORMAL HIGH (ref 0.44–1.00)
Creatinine, Ser: 1.17 mg/dL — ABNORMAL HIGH (ref 0.44–1.00)
GFR, Estimated: 48 mL/min — ABNORMAL LOW (ref >60)
GFR, Estimated: 50 mL/min — ABNORMAL LOW (ref >60)
Glucose, Bld: 93 mg/dL (ref 70–99)
Glucose, Bld: 98 mg/dL (ref 70–99)
Potassium: 4.5 mmol/L (ref 3.5–5.1)
Potassium: 4.3 mmol/L (ref 3.5–5.1)
Sodium: 140 mmol/L (ref 135–145)
Sodium: 139 mmol/L (ref 135–145)
Total Bilirubin: 0.4 mg/dL (ref 0.3–1.2)
Total Bilirubin: 0.5 mg/dL (ref 0.3–1.2)
Total Protein: 6.5 g/dL (ref 6.5–8.1)
Total Protein: 7.3 g/dL (ref 6.5–8.1)

## 2022-04-23 LAB — PROTEIN / CREATININE RATIO, URINE
Creatinine, Urine: 89 mg/dL
Protein Creatinine Ratio: 2.84 mg/mg{Cre} — ABNORMAL HIGH (ref 0.00–0.15)
Total Protein, Urine: 253 mg/dL

## 2022-04-23 LAB — CK: Total CK: 46 U/L (ref 38–234)

## 2022-04-23 LAB — LACTIC ACID, PLASMA: Lactic Acid, Venous: 0.9 mmol/L (ref 0.5–1.9)

## 2022-04-23 LAB — SODIUM, URINE, RANDOM: Sodium, Ur: 51 mmol/L

## 2022-04-23 LAB — LITHIUM LEVEL: Lithium Lvl: 1.22 mmol/L — ABNORMAL HIGH (ref 0.60–1.20)

## 2022-04-23 LAB — HEMOGLOBIN AND HEMATOCRIT, BLOOD
HCT: 40.9 % (ref 36.0–46.0)
Hemoglobin: 13.1 g/dL (ref 12.0–15.0)

## 2022-04-23 LAB — MAGNESIUM: Magnesium: 2.1 mg/dL (ref 1.7–2.4)

## 2022-04-23 LAB — CREATININE, URINE, RANDOM: Creatinine, Urine: 88 mg/dL

## 2022-04-23 LAB — TSH: TSH: 1.433 u[IU]/mL (ref 0.350–4.500)

## 2022-04-23 LAB — PHOSPHORUS: Phosphorus: 2.5 mg/dL (ref 2.5–4.6)

## 2022-04-23 LAB — POC OCCULT BLOOD, ED: Fecal Occult Bld: NEGATIVE

## 2022-04-23 MED ORDER — LURASIDONE HCL 40 MG PO TABS
120.0000 mg | ORAL_TABLET | Freq: Every day | ORAL | Status: DC
  Administered 2022-04-23 – 2022-04-25 (×3): 120 mg via ORAL
  Filled 2022-04-23 (×3): qty 3

## 2022-04-23 MED ORDER — ACETAMINOPHEN 325 MG PO TABS
650.0000 mg | ORAL_TABLET | Freq: Four times a day (QID) | ORAL | Status: DC | PRN
  Administered 2022-04-24 – 2022-04-25 (×2): 650 mg via ORAL
  Filled 2022-04-23 (×2): qty 2

## 2022-04-23 MED ORDER — CLOPIDOGREL BISULFATE 75 MG PO TABS
75.0000 mg | ORAL_TABLET | Freq: Every day | ORAL | Status: DC
  Administered 2022-04-23 – 2022-04-25 (×3): 75 mg via ORAL
  Filled 2022-04-23 (×4): qty 1

## 2022-04-23 MED ORDER — LORAZEPAM 0.5 MG PO TABS: 0.5000 mg | ORAL_TABLET | Freq: Three times a day (TID) | ORAL | Status: DC | PRN

## 2022-04-23 MED ORDER — LACTATED RINGERS IV SOLN: INTRAVENOUS | Status: DC

## 2022-04-23 MED ORDER — SODIUM CHLORIDE 0.9 % IV SOLN
1.0000 g | INTRAVENOUS | Status: DC
  Administered 2022-04-23 – 2022-04-24 (×2): 1 g via INTRAVENOUS
  Filled 2022-04-23 (×2): qty 10

## 2022-04-23 MED ORDER — SERTRALINE HCL 100 MG PO TABS
100.0000 mg | ORAL_TABLET | Freq: Every day | ORAL | Status: DC
  Administered 2022-04-23 – 2022-04-25 (×3): 100 mg via ORAL
  Filled 2022-04-23 (×3): qty 1

## 2022-04-23 MED ORDER — ACETAMINOPHEN 650 MG RE SUPP: 650.0000 mg | Freq: Four times a day (QID) | RECTAL | Status: DC | PRN

## 2022-04-23 MED ORDER — PANTOPRAZOLE SODIUM 40 MG IV SOLR
40.0000 mg | Freq: Two times a day (BID) | INTRAVENOUS | Status: DC
  Administered 2022-04-23 – 2022-04-25 (×5): 40 mg via INTRAVENOUS
  Filled 2022-04-23 (×5): qty 10

## 2022-04-23 MED ORDER — ATORVASTATIN CALCIUM 40 MG PO TABS
40.0000 mg | ORAL_TABLET | Freq: Every day | ORAL | Status: DC
  Administered 2022-04-23 – 2022-04-25 (×3): 40 mg via ORAL
  Filled 2022-04-23 (×3): qty 1

## 2022-04-23 MED ORDER — FLUPHENAZINE HCL 5 MG PO TABS
20.0000 mg | ORAL_TABLET | Freq: Every day | ORAL | Status: DC
  Administered 2022-04-23 – 2022-04-25 (×3): 20 mg via ORAL
  Filled 2022-04-23 (×4): qty 4

## 2022-04-23 MED ORDER — MELATONIN 5 MG PO TABS: 5.0000 mg | ORAL_TABLET | Freq: Every evening | ORAL | Status: DC | PRN

## 2022-04-23 MED ORDER — BENZTROPINE MESYLATE 1 MG PO TABS
1.0000 mg | ORAL_TABLET | Freq: Two times a day (BID) | ORAL | Status: DC
  Administered 2022-04-23 – 2022-04-25 (×6): 1 mg via ORAL
  Filled 2022-04-23 (×9): qty 1

## 2022-04-23 MED ORDER — LITHIUM CARBONATE ER 300 MG PO TBCR
300.0000 mg | EXTENDED_RELEASE_TABLET | Freq: Every day | ORAL | Status: DC
  Administered 2022-04-23: 300 mg via ORAL
  Filled 2022-04-23: qty 1

## 2022-04-23 NOTE — ED Notes (Signed)
Pt ambulatory to restroom with 1 assist. Requesting medication for headache

## 2022-04-23 NOTE — H&P (Signed)
History and Physical    PLEASE NOTE THAT DRAGON DICTATION SOFTWARE WAS USED IN THE CONSTRUCTION OF THIS NOTE.   Heidi Blevins CVE:[id number] DOB: 09-14-1950 DOA: 04/22/2022  PCP: [name], MD  Patient coming from: home   I have personally briefly reviewed patient's old medical records in [place]  Chief Complaint: altered mental status  HPI: Heidi Blevins is a 71 y.o. female with medical history significant for chronic diastolic heart failure, essential hypertension, hyperlipidemia, schizoaffective disorder, generalized anxiety disorder, who is admitted to [facility] on 04/22/2022 with acute metabolic encephalopathy after presenting from independent living facility to [facility] ED for evaluation of altered mental status.   In the setting of the patient's altered mental status, the following history is provided by the patient's son, in addition to my discussions with the EDP and via chart review.  Patient's son conveys that the patient has been confused relative to her baseline mental status over the course of the last week.  At baseline, she is reportedly alert and oriented x4, and resides in independent living facility.  However, over the course the last week, and in the setting of progressive confusion over that time, she has been requiring increasing assistance with her ADLs.  No reported trauma.    Her medical history is notable for chronic diastolic heart failure, with most recent echocardiogram, which was performed in December 2021, it was notable for LVEF 65 to 70%, no focal wall motion values, grade 1 diastolic switch, normal right ventricular systolic function, no significant valvular pathology.  Not on any scheduled diuretic medications at home.  Per chart review, her baseline creatinine ranges 0.8-1.0.  She has a history of essential pretension is noted to be on lisinopril at home.   Additionally, per chart review, her most recent prior serum calcium level  was noted to be 10.3 on 02/17/2021.  She has a documented history of schizoaffective disorder as well as generalized anxiety disorder, on several central acting medications at home, including lithium.      ED Course:  Vital signs in the ED were notable for the following: Afebrile; heart rate 81-90; initial blood pressure 104/69, which increased to 125/69 following interval IV fluids, as further detailed below; respiratory rate 17-20, oxygen saturation 100% on room air.  Labs were notable for the following: CMP notable for the following: Bicarbonate 22, anion gap 7, creatinine 1.21, glucose 93, calcium, adjusted for mild hypoalbuminemia noted to be 11, albumin 3.4, otherwise liver enzymes within normal limits.  CBC notable for white blood cell count 8900.  Lactate 0.9.  Urinalysis associated with cloudy appearing specimen and was notable for greater than 50 white blood cells, many bacteria, moderate leukocyte esterase, a hemoglobin negative with greater than 50 red blood cells and greater than 300 protein.  Urine culture collected prior to initiation of IV antibiotics.  Imaging and additional notable ED work-up: EKG, in comparison to most recent prior from 08/12/2020, shows sinus rhythm with bifascicular block, which was also present on prior EKG, with heart rate 82 and no evidence of T wave or ST changes.  Chest x-ray showed no evidence of acute cardiopulmonary process.  Noncontrast CT head showed no evidence of acute intracranial process, including no evidence of intracranial hemorrhage or any evidence of acute infarct.  While in the ED, the following were administered: 500 cc NS bolus, Rocephin.  Subsequently, the patient was admitted for further evaluation management of acute metabolic encephalopathy in setting of suspected acute cystitis as  well as acute kidney injury, dehydration, and mild hypercalcemia.    Review of Systems: As per HPI otherwise 10 point review of systems negative.   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 She has a 10.00 pack-year smoking history. She has never used smokeless tobacco. She reports that she does not drink alcohol and does not use drugs.   Allergies  Allergen Reactions   Sulfonamide Derivatives Hives         Family History  Problem Relation Age of Onset   CVA Mother    Hypertension Mother    Lupus Sister    Hypertension Brother    Peptic Ulcer Disease Father    Colon cancer Neg Hx      Prior to Admission medications   Medication Sig Start Date End Date Taking? Authorizing Provider  acetaminophen (TYLENOL) 325 MG tablet Take 650 mg by mouth every 4 (four) hours as needed for mild pain, moderate pain, fever or headache.   Yes [provider]  atorvastatin (LIPITOR) 40 MG tablet Take 1 tablet (40 mg total) by mouth daily. Patient taking differently: Take 40 mg by mouth at bedtime. 08/13/20  Yes Lavina Hamman, MD  benztropine (COGENTIN) 1 MG tablet Take 1 mg by mouth 2 (two) times daily.   Yes [provider]  clopidogrel (PLAVIX) 75 MG tablet Take 1 tablet (75 mg total) by mouth daily. 08/13/20  Yes Lavina Hamman, MD  Cranberry 425 MG CAPS Take 425 mg by mouth 2 (two) times a day.   Yes [provider]  ferrous [id number] C-folic acid (FEROCON) capsule Take 1 capsule by mouth at  bedtime.   Yes [provider]  fluPHENAZine (PROLIXIN) 10 MG tablet Take 20 mg by mouth daily.   Yes [provider]  lisinopril (ZESTRIL) 20 MG tablet Take 20 mg by mouth daily. 04/18/22  Yes [provider]  lithium carbonate (LITHOBID) 300 MG CR tablet Take 300 mg by mouth at bedtime. 04/21/22  Yes [provider]  loratadine (CLARITIN) 10 MG tablet Take 10 mg by mouth daily.   Yes [provider]  LORazepam (ATIVAN) 0.5 MG tablet Take 0.5 mg by mouth in the morning, at noon, in the evening, and at bedtime.   Yes [provider]  Lurasidone HCl (LATUDA) 120 MG TABS Take 120 mg by mouth daily.   Yes [provider]  melatonin 5 MG TABS Take 5 mg by mouth at bedtime.   Yes [provider]  Multiple Vitamins-Minerals (MULTIVITAMINS THER. W/MINERALS) TABS tablet Take 1 tablet by mouth daily.   Yes [provider]  OVER THE COUNTER MEDICATION Take 1 Bottle by mouth 3 (three) times daily. Mighty shakes   Yes [provider]  polyethylene glycol powder (GLYCOLAX/MIRALAX) 17 GM/SCOOP powder Take 17 g by mouth daily.   Yes [provider]  Pramoxine HCl (VAGISIL ANTI-ITCH MEDICATED) 1 % MISC Apply vaginally once a day Apply 1 application topically daily as needed (for vaginal itching). Apply vaginally once a day Patient taking differently: Apply 1 application  topically daily as needed (for vaginal itching). 03/25/19  Yes Eugenie Filler, MD  Propylene Glycol (SYSTANE BALANCE OP) Place 1 drop into both eyes 3 (three) times daily.   Yes [provider]  sertraline (ZOLOFT) 100 MG tablet Take 100 mg by mouth daily.   Yes [provider]  simethicone (MYLICON) 80 MG chewable tablet Chew 1 tablet (80 mg total) by mouth 4 (four) times daily. 08/13/20  Yes Lavina Hamman, MD  paliperidone (INVEGA SUSTENNA) 156 MG/ML SUSY injection Inject 156 mg into the muscle every 30 (thirty) days.     [provider]     Objective    Physical Exam: Vitals:   04/22/22 2130 04/22/22 2145 04/22/22 2310 04/23/22 0154  BP: 109/71 103/67 125/69   Pulse: (!) 42  92   Resp: 20  20   Temp:    98.2 F (36.8 C)  TempSrc:    Oral  SpO2: (!) 73%  92%   Weight:      Height:        General: appears to be stated age; alert, confused Skin: warm, dry, no rash Head:  AT/[place] Mouth:  Oral mucosa membranes appear dry, normal dentition Neck: supple; trachea midline Heart:  RRR; did not appreciate any M/R/G Lungs: CTAB, did not appreciate any wheezes, rales, or rhonchi Abdomen: + BS; soft, ND, NT Vascular: 2+ pedal pulses b/l; 2+ radial pulses b/l Extremities: no peripheral edema, no muscle wasting Neuro: strength and sensation intact in upper and lower extremities b/l    Labs on Admission: I have personally reviewed following labs and imaging studies  CBC: Recent Labs  Lab 04/22/22 2250  WBC 8.9  NEUTROABS 5.3  HGB 12.8  HCT 40.9  MCV 99.8  PLT 262   Basic Metabolic Panel: Recent Labs  Lab 04/23/22 0033  NA 140  K 4.5  CL 111  CO2 22  GLUCOSE 93  BUN 11  CREATININE 1.21*  CALCIUM 10.6*   GFR: Estimated Creatinine Clearance: 38.4 mL/min (A) (by C-G formula based on SCr of 1.21 mg/dL (H)). Liver Function Tests: Recent Labs  Lab 04/23/22 0033  AST 23  ALT 24  ALKPHOS 103  BILITOT 0.4  PROT 6.5  ALBUMIN 3.4*   No results for input(s): "LIPASE", "AMYLASE" in the last 168 hours. No results for input(s): "AMMONIA" in the last 168 hours. Coagulation Profile: No results for input(s): "INR", "PROTIME" in the last 168 hours. Cardiac Enzymes: No results for input(s): "CKTOTAL", "CKMB", "CKMBINDEX", "TROPONINI" in the last 168 hours. BNP (last 3 results) No results for input(s): "PROBNP" in the last 8760 hours. HbA1C: No results for input(s): "HGBA1C" in the last 72 hours. CBG: Recent Labs  Lab 04/22/22 2052 04/22/22 2244  GLUCAP 102* 97   Lipid  Profile: No results for input(s): "CHOL", "HDL", "LDLCALC", "TRIG", "CHOLHDL", "LDLDIRECT" in the last 72 hours. Thyroid Function Tests: No results for input(s): "TSH", "T4TOTAL", "FREET4", "T3FREE", "THYROIDAB" in the last 72 hours. Anemia Panel: No results for input(s): "VITAMINB12", "FOLATE", "FERRITIN", "TIBC", "IRON", "RETICCTPCT" in the last 72 hours. Urine analysis:    Component Value Date/Time   COLORURINE AMBER (A) 04/22/2022 2250  APPEARANCEUR CLOUDY (A) 04/22/2022 2250   LABSPEC 1.012 04/22/2022 2250   PHURINE 8.0 04/22/2022 2250   GLUCOSEU NEGATIVE 04/22/2022 2250   HGBUR NEGATIVE 04/22/2022 2250   BILIRUBINUR NEGATIVE 04/22/2022 2250   KETONESUR 5 (A) 04/22/2022 2250   PROTEINUR >=300 (A) 04/22/2022 2250   UROBILINOGEN 1.0 08/05/2011 1751   NITRITE NEGATIVE 04/22/2022 2250   LEUKOCYTESUR MODERATE (A) 04/22/2022 2250    Radiological Exams on Admission: DG Chest Portable 1 View  Result Date: 04/22/2022 CLINICAL DATA:  Altered mental status EXAM: PORTABLE CHEST 1 VIEW COMPARISON:  02/17/2021 FINDINGS: Severe thoracolumbar scoliosis with posterior spinal rods. Heart is normal size. Tortuous aorta. Lungs clear. No effusions or acute bony abnormality. IMPRESSION: No active disease. Electronically Signed   By: Rolm Baptise M.D.   On: 04/22/2022 22:25   CT HEAD WO CONTRAST  Result Date: 04/22/2022 CLINICAL DATA:  Altered mental status EXAM: CT HEAD WITHOUT CONTRAST TECHNIQUE: Contiguous axial images were obtained from the base of the skull through the vertex without intravenous contrast. RADIATION DOSE REDUCTION: This exam was performed according to the departmental dose-optimization program which includes automated exposure control, adjustment of the mA and/or kV according to patient size and/or use of iterative reconstruction technique. COMPARISON:  02/17/2021 FINDINGS: Brain: No evidence of acute infarction, hemorrhage, hydrocephalus, extra-axial collection or mass lesion/mass  effect. Subcortical white matter and periventricular small vessel ischemic changes. Vascular: Intracranial atherosclerosis. Skull: Normal. Negative for fracture or focal lesion. Sinuses/Orbits: The visualized paranasal sinuses are essentially clear. The mastoid air cells are unopacified. Other: None. IMPRESSION: No acute intracranial abnormality. Small vessel ischemic changes. Electronically Signed   By: Julian Hy M.D.   On: 04/22/2022 22:18     EKG: Independently reviewed, with result as described above.    Assessment/Plan   Principal Problem:   Acute metabolic encephalopathy Active Problems:   Hypercalcemia   Schizoaffective disorder (HCC)   AKI (acute kidney injury) ([place])   Essential hypertension   Acute cystitis   Chronic diastolic CHF (congestive heart failure) (HCC)   GAD (generalized anxiety disorder)   HLD (hyperlipidemia)      #) Acute metabolic encephalopathy: Confusion relative to baseline over the last week, complicating the patient's ability to independently perform her ADLs, which is notable as she lives in an independent living facility.  Appears to be multifactorial in etiology, with suspected contributions from acute cystitis, dehydration, acute kidney injury, mild hypercalcemia.  There may be pharmacologic contributions in the setting of polypharmacy and the inclusion of several central acting medications on home list, including Zoloft, Latuda, benztropine, fluphenazine, scheduled Ativan. Will also specifically check serum lithium level, particularly given interval development of AKI and hypercalcemia.  No evidence of additional underlying factious process.  No overt evidence of acute focal neurologic deficits to suggest acute CVA, while noncontrast CTh is no evidence of acute intracranial process.  In the setting of her presenting confusion, acute kidney injury, and noting that she is on high intensity atorvastatin as outpatient, will also check CPK  level.   Plan: Further evaluation and management of acute cystitis, AKI, dehydration and mild hypercalcemia, as further detailed below, including gentle IV fluids.  Patient remain n.p.o. until she has passed RN swallow screen.  Add on serum lithium level.  Fall precautions.  Add on CPK level.  Check TSH, VBG.  Will change outpatient scheduled Ativan to prn.  We will also hold scheduled outpatient Claritin for now to remove any associated anticholinergic contribution towards her presenting acute encephalopathy.  Repeat  CMP and CBC in the morning.  Add on serum magnesium level.        #) Acute cystitis: In the setting of 1 week of confusion, urinalysis appears to be consistent with UTI, in the setting of a cloudy appearing specimen with significant pyuria and many bacteria.  Of note, no SIRS criteria met at this time.  Consequently, criteria for sepsis not currently met.  Of note, lactate nonelevated.  She appears hemodynamically stable.  Her urine culture added to existing urine sample prior to initiation of IV Rocephin, which will be continued for suspected uncomplicated UTI that appears consistent with acute cystitis.  Plan: Follow-up results of urine culture.  Continue Rocephin.  Repeat CBC with differential in the morning.           #) Acute Kidney Injury: Presenting serum creatinine 1.21 relative to baseline of 0.8-1.0.  Potentially multifactorial in etiology, with suspected prerenal contribution from clinical evidence of dehydration as well as potential pharmacologic contributions in the setting of outpatient lisinopril.  We will also check serum lithium level, as further detailed above, and closely monitor ensuing serum calcium level given presenting mild hypercalcemia.  Of note, presenting urinalysis with microscopy showed greater than 50 white blood cells, no hemoglobin will demonstrating greater than 50 red blood cells greater than 300 protein.  Plan: monitor strict I's & O's and  daily weights. Attempt to avoid nephrotoxic agents.  Hold home lisinopril for now.  Add on serum lithium level.  Refrain from NSAIDs. Repeat CMP in the morning. Check serum magnesium level. Add-on random urine sodium and random urine creatinine.  add-on random urine protein/cr ratio.  Add on CPK level.  Lactated Ringer's at 100 cc/h x 12 hours.          #) Hypercalcemia: Presenting labs reflect adjusted serum calcium of 11.0, relative to most recent prior adjusted serum calcium level of 10.3, as further detailed above.  Suspect element of dehydration.  A potential contribution from lithium is noted, although it appears that she was also on lithium at the time of her most recent prior serum calcium level that was noted to be within normal limits, albeit on the high end of normal.   Will initiate gentle IVF's, as above, with repeat calcium level in the morning, with consideration for further expansion of work-up if no ensuing improvement in serum calcium level following interval IVF administration.     Plan: LR, as above.  Monitor strict I's&O's, daily weights.  CMP in the morning.  Check serum Mg and Phos levels.  Add on serum lithium level, as above.               #) Chronic diastolic heart failure: documented history of such, with most recent echocardiogram performed in December 2021 notable for LVEF 65 to 70% as well as grade 1 diastolic dysfunction, with additional details as conveyed above. No clinical or radiographic evidence to suggest acutely decompensated heart failure at this time.  However, will closely monitor for ensuing evidence of volume overload given plan for IV fluid administration in the setting of dehydration, AKI, and hypercalcemia.  Home diuretic regimen reportedly consists of the following: None.    Plan: monitor strict I's & O's and daily weights. Repeat CMP in AM. Check serum mag level.           #) Schizoaffective disorder/generalized anxiety  disorder: Documented history of such, reportedly on several central acting medications that include lithium fluphenazine, benztropine, Latuda, Zoloft.  Additionally, she  is on scheduled Ativan as an outpatient.  Unclear at this time, if there were any recent changes to this regimen leading up to development of altered mental status over the course the last week.   Plan: In setting of presenting altered mental status, will change scheduled Ativan to prn for now.  Add on serum lithium level, as above.  will otherwise resume outpatient central acting/antipsychotic medications.            #) Essential Hypertension: documented h/o such, with outpatient antihypertensive regimen including lisinopril.  SBP's in the ED today: In the low 100s mmHg. in the setting of presenting AKI, will hold home lisinopril for now.  Plan: Close monitoring of subsequent BP via routine VS. hold on lisinopril for now.              #) Hyperlipidemia: documented h/o such. On high intensity atorvastatin as outpatient.  In the setting of acute encephalopathy and AKI, will add on CPK level.   Plan: continue home statin for now.  Add on CPK level.        DVT prophylaxis: SCD's   Code Status: Full code (presumed for now in the setting of the presents current altered mental status).  Disposition Plan: Per Rounding Team Consults called: none;  Admission status: inpatient    PLEASE NOTE THAT DRAGON DICTATION SOFTWARE WAS USED IN THE CONSTRUCTION OF THIS NOTE.   [place] DO [facility]  From [place]   04/23/2022, 2:31 AM

## 2022-04-23 NOTE — ED Provider Notes (Signed)
I provided a substantive portion of the care of this patient.  I personally performed the entirety of the history, exam, and medical decision making for this encounter.  EKG Interpretation  Date/Time:  Saturday April 22 2022 20:00:06 EDT Ventricular Rate:  82 PR Interval:  194 QRS Duration: 156 QT Interval:  405 QTC Calculation: 473 R Axis:   -85 Text Interpretation: Sinus rhythm RBBB and LAFB  Similar to prior study Confirmed by Thayer Jew [phone]) on 04/23/2022 1:20:14 AM  Patient presented initially with altered mental Status and generalized weakness.  Per her son she is not her baseline.  She is normally ambulatory independently and oriented.  She is only oriented to herself.  Work-up notable for evidence of UTI.  Prior urine cultures are unrevealing.  She was given a dose of IV Rocephin.  She does clinically appear dry and she has a mild AKI with creatinine of 1.21 up from baseline of 0.8 - 0.9.  Given that she is independent at her living facility and not back to her baseline with evidence of dehydration and acute infection, will admit for IV fluids and antibiotics.  She does not appear septic.   Merryl Hacker, MD 04/23/22 [phone]

## 2022-04-23 NOTE — Hospital Course (Signed)
14 F with chronic diastolic heart failure, schizoaffective disorder, admitted from independent living facility with acute encephalopathy, potentially multifactorial in etiology, with suspected contributions from UTI, dehydration, acute kidney injury, mild hypercalcemia, and potentially polypharmacy in the setting of her AKI and hypercalcemia, ordered lithium level, started on Rocephin, gentle IVF's and admitted for further management.  Patient clinically improved with IV fluid hydration, IV antibiotics.  Urine culture came back positive with Proteus mirabilis sensitive to Keflex Cipro ampicillin.  PT OT sustained home health PT OT.  At this time mental status improved at baseline and she will be discharged home.  Of note lithium level is slightly supratherapeutic psychiatry is consulted

## 2022-04-23 NOTE — ED Notes (Signed)
Pt assisted to the bathroom and back in bed.

## 2022-04-23 NOTE — Progress Notes (Addendum)
Patient seen and examined personally, I reviewed the chart, history and physical and admission note, done by admitting physician this morning and agree with the same with following addendum.  Please refer to the morning admission note for more detailed plan of care.  Briefly,  29 F with chronic diastolic heart failure, schizoaffective disorder, admitted from independent living facility with acute encephalopathy, potentially multifactorial in etiology, with suspected contributions from UTI, dehydration, acute kidney injury, mild hypercalcemia, and potentially polypharmacy in the setting of her AKI and hypercalcemia, ordered lithium level, started on Rocephin, gentle IVF's and admitted for further management   On exam this morning she is much more conversant alert awake oriented to self. Denies any complaint.  Nursing reports she was not interactive conversant on arrival She ate her breakfast as well.  issues Acute metabolic encephalopathy Acute cystitis Acute kidney injury Hypercalcemia Chronic diastolic CHF-cxr clear,On room air and no shortness of breath Schizoaffective disorder/GAD Hyperlipidemia Generalized weakness Blackish stool per RN in ED- ordered hemoccult,trend h/h.add ppi for now.  Labs this morning with a stable CBC creatinine improved to 1.1 calcium 10.4 continue gentle IV hydration continue IV ceftriaxone.  Cont home lithium fluphenazine Zoloft  &Cogentin.  Continue her statin Plavix Continue PT OT evaluation for disposition planning This evening at 5 pm I called and updated her son how is on his way to see her, Normally she is in ILF and conversant and oriented.

## 2022-04-23 NOTE — ED Notes (Signed)
Pt assisted to the bathroom, gown and socks changed.

## 2022-04-24 DIAGNOSIS — G9341 Metabolic encephalopathy: Secondary | ICD-10-CM | POA: Diagnosis not present

## 2022-04-24 LAB — I-STAT VENOUS BLOOD GAS, ED
Acid-base deficit: 2 mmol/L (ref 0.0–2.0)
Bicarbonate: 22.3 mmol/L (ref 20.0–28.0)
Calcium, Ion: 1.24 mmol/L (ref 1.15–1.40)
HCT: 39 % (ref 36.0–46.0)
Hemoglobin: 13.3 g/dL (ref 12.0–15.0)
O2 Saturation: 43 %
Potassium: 4.4 mmol/L (ref 3.5–5.1)
Sodium: 139 mmol/L (ref 135–145)
TCO2: 23 mmol/L (ref 22–32)
pCO2, Ven: 34.8 mmHg — ABNORMAL LOW (ref 44–60)
pH, Ven: 7.414 (ref 7.25–7.43)
pO2, Ven: 23 mmHg — CL (ref 32–45)

## 2022-04-24 LAB — BASIC METABOLIC PANEL
Anion gap: 7 (ref 5–15)
BUN: 10 mg/dL (ref 8–23)
CO2: 20 mmol/L — ABNORMAL LOW (ref 22–32)
Calcium: 10.6 mg/dL — ABNORMAL HIGH (ref 8.9–10.3)
Chloride: 112 mmol/L — ABNORMAL HIGH (ref 98–111)
Creatinine, Ser: 1.09 mg/dL — ABNORMAL HIGH (ref 0.44–1.00)
GFR, Estimated: 54 mL/min — ABNORMAL LOW (ref >60)
Glucose, Bld: 119 mg/dL — ABNORMAL HIGH (ref 70–99)
Potassium: 4.1 mmol/L (ref 3.5–5.1)
Sodium: 139 mmol/L (ref 135–145)

## 2022-04-24 LAB — LITHIUM LEVEL: Lithium Lvl: 1.26 mmol/L — ABNORMAL HIGH (ref 0.60–1.20)

## 2022-04-24 MED ORDER — NICOTINE 14 MG/24HR TD PT24
14.0000 mg | MEDICATED_PATCH | Freq: Every day | TRANSDERMAL | Status: DC
  Administered 2022-04-24 – 2022-04-25 (×2): 14 mg via TRANSDERMAL
  Filled 2022-04-24 (×2): qty 1

## 2022-04-24 NOTE — ED Notes (Signed)
Walked patient to the bathroom patient did well patient is now back in bed on the monitor with call bell in reach  

## 2022-04-24 NOTE — ED Notes (Signed)
RN observed patient knocking on the inside of the door trying to get assistance. RN entered room and assisted patient to ambulate to restroom. Patient required minimal assistance. Patient coached to utilize call bell when needing to go to restroom so staff can unhook monitoring devices.

## 2022-04-24 NOTE — Evaluation (Signed)
Physical Therapy Evaluation Patient Details Name: Heidi Blevins MRN: [id number] DOB: 1950-10-15 Today's Date: 04/24/2022  History of Present Illness  Pt is a 71 y/o female admitted secondary to AMS. PMH includes HTN, schizoaffective disorder, and CHF.  Clinical Impression  Pt admitted secondary to problem above with deficits below. Pt requiring min guard to min A for mobility tasks this session. Mild instability noted. Educated about using RW to increase safety. Would benefit from PT follow up upon return to ALF to address current deficits. Will continue to follow acutely.      Recommendations for follow up therapy are one component of a multi-disciplinary discharge planning process, led by the attending physician.  Recommendations may be updated based on patient status, additional functional criteria and insurance authorization.  Follow Up Recommendations Home health PT (at ALF)      Assistance Recommended at Discharge Frequent or constant Supervision/Assistance  Patient can return home with the following  A little help with walking and/or transfers;A little help with bathing/dressing/bathroom;Assistance with cooking/housework;Assist for transportation    Equipment Recommendations Rolling walker (2 wheels) (if she does not already have)  Recommendations for Other Services       Functional Status Assessment Patient has had a recent decline in their functional status and demonstrates the ability to make significant improvements in function in a reasonable and predictable amount of time.     Precautions / Restrictions Precautions Precautions: Fall Restrictions Weight Bearing Restrictions: No      Mobility  Bed Mobility Overal bed mobility: Needs Assistance Bed Mobility: Supine to Sit, Sit to Supine     Supine to sit: Supervision Sit to supine: Supervision   General bed mobility comments: Supervision for safety.    Transfers Overall transfer level: Needs  assistance Equipment used: None Transfers: Sit to/from Stand Sit to Stand: Min guard           General transfer comment: Min guard for safety.    Ambulation/Gait Ambulation/Gait assistance: Min guard, Min assist Gait Distance (Feet): 20 Feet Assistive device: None Gait Pattern/deviations: Step-through pattern, Decreased stride length, Shuffle Gait velocity: Decreased     General Gait Details: Min guard to min A for stability. Shuffle type steps. Pt stopped after exiting room and stated she needed to turn around because she felt weak, so further mobility deferred.  Stairs            Wheelchair Mobility    Modified Rankin (Stroke Patients Only)       Balance Overall balance assessment: Needs assistance Sitting-balance support: No upper extremity supported, Feet supported Sitting balance-Leahy Scale: Fair     Standing balance support: No upper extremity supported Standing balance-Leahy Scale: Fair                               Pertinent Vitals/Pain Pain Assessment Pain Assessment: No/denies pain    Home Living Family/patient expects to be discharged to:: Assisted living                 Home Equipment: Conservation officer, nature (2 wheels)      Prior Function Prior Level of Function : Needs assist             Mobility Comments: Normally ambulates independently, reports occasionally using RW ADLs Comments: Requires assist with ADL tasks     Hand Dominance        Extremity/Trunk Assessment   Upper Extremity Assessment Upper Extremity Assessment: Defer to  OT evaluation    Lower Extremity Assessment Lower Extremity Assessment: Generalized weakness    Cervical / Trunk Assessment Cervical / Trunk Assessment: Kyphotic  Communication   Communication: No difficulties  Cognition Arousal/Alertness: Awake/alert Behavior During Therapy: Flat affect Overall Cognitive Status: No family/caregiver present to determine baseline cognitive  functioning                                 General Comments: Oriented to person only. Slow processing noted.        General Comments General comments (skin integrity, edema, etc.): No family present    Exercises     Assessment/Plan    PT Assessment Patient needs continued PT services  PT Problem List Decreased strength;Decreased balance;Decreased activity tolerance;Decreased mobility;Decreased knowledge of use of DME;Decreased knowledge of precautions;Decreased safety awareness;Decreased cognition       PT Treatment Interventions DME instruction;Gait training;Stair training;Functional mobility training;Therapeutic exercise;Therapeutic activities;Balance training;Patient/family education    PT Goals (Current goals can be found in the Care Plan section)  Acute Rehab PT Goals Patient Stated Goal: to go back to ALF PT Goal Formulation: With patient Time For Goal Achievement: 05/08/22 Potential to Achieve Goals: Good    Frequency Min 3X/week     Co-evaluation               AM-PAC PT "6 Clicks" Mobility  Outcome Measure Help needed turning from your back to your side while in a flat bed without using bedrails?: A Little Help needed moving from lying on your back to sitting on the side of a flat bed without using bedrails?: A Little Help needed moving to and from a bed to a chair (including a wheelchair)?: A Little Help needed standing up from a chair using your arms (e.g., wheelchair or bedside chair)?: A Little Help needed to walk in hospital room?: A Little Help needed climbing 3-5 steps with a railing? : A Lot 6 Click Score: 17    End of Session Equipment Utilized During Treatment: Gait belt Activity Tolerance: Patient limited by fatigue Patient left: in bed;with call bell/phone within reach (on bed in ED) Nurse Communication: Mobility status PT Visit Diagnosis: Unsteadiness on feet (R26.81);Muscle weakness (generalized) (M62.81);Difficulty in  walking, not elsewhere classified (R26.2)    Time: 5009-3818 PT Time Calculation (min) (ACUTE ONLY): 12 min   Charges:   PT Evaluation $PT Eval Moderate Complexity: 1 Mod          Reuel Derby, PT, DPT  Acute Rehabilitation Services  Office: [phone]   Rudean Hitt 04/24/2022, 9:22 AM

## 2022-04-24 NOTE — ED Notes (Signed)
Pt ambulated to the bathroom with 1 person assist.

## 2022-04-24 NOTE — ED Notes (Signed)
Got patient on there monitor did vitals patient is resting with call bell in reach

## 2022-04-24 NOTE — ED Notes (Signed)
Patient sitting on the side. Of the bed

## 2022-04-24 NOTE — Progress Notes (Signed)
PROGRESS NOTE Heidi Blevins  VVK:[id number] DOB: 1950/10/01 DOA: 04/22/2022 PCP: [name], MD   Brief Narrative/Hospital Course: 44 F with chronic diastolic heart failure, schizoaffective disorder, admitted from independent living facility with acute encephalopathy, potentially multifactorial in etiology, with suspected contributions from UTI, dehydration, acute kidney injury, mild hypercalcemia, and potentially polypharmacy in the setting of her AKI and hypercalcemia, ordered lithium level, started on Rocephin, gentle IVF's and admitted for further management    Subjective: Seen and examined.  She is alert awake oriented x3 at baseline appears clinically improving But appears weak.  Assessment and Plan: Principal Problem:   Acute metabolic encephalopathy Active Problems:   Hypercalcemia   Schizoaffective disorder (HCC)   AKI (acute kidney injury) ([place])   Essential hypertension   Acute cystitis   Chronic diastolic CHF (congestive heart failure) (HCC)   GAD (generalized anxiety disorder)   HLD (hyperlipidemia)   Acute metabolic encephalopathy: Suspect multifactorial in the setting of dehydration, UTI.  Clinically improving, continue supportive care PT OT, suggested home health PT OT.  Acute cystitis urine/POA due to Proteus: Continue current IV antibiotics pending urine culture Acute kidney injury: Creat has nicely trended down Recent Labs  Lab 04/23/22 0033 04/23/22 0318 04/24/22 0249  BUN '11 10 10  '$ CREATININE 1.21* 1.17* 1.09*    Hypercalcemia, mild, ionized calcium normal 1.24, intact PTH pending.  Continue IV hydration repeat BMP in a.m.  Chronic diastolic CHF-cxr clear,On room air and no shortness of breath.  Tolerating gentle hydration  Schizoaffective disorder/GAD: Mood is stable on Prolixin, Cogentin, Zoloft Latuda and lithium.  Holding lithium dose tonight and repeating labs, as lithium dose was slightly supratherapeutic, discussed with psychiatrist  team.  Hyperlipidemia: Continue her atorvastatin  Generalized weakness/debility Continue PT OT plan for home health PT OT upon discharge  DVT prophylaxis: SCDs Start: 04/23/22 0147 Code Status:   Code Status: Full Code Family Communication: plan of care discussed with patienti had called her son yesterday  Patient status is: inpatient because of ongoing management of acute metabolic encephalopathy and UTI Level of care: Telemetry Medical   Dispo: The patient is from: Home            Anticipated disposition: Home with home health likely tomorrow  Mobility Assessment (last 72 hours)     Mobility Assessment     Row Name 04/24/22 [phone]           What is the highest level of mobility based on the progressive mobility assessment? Level 5 (Walks with assist in room/hall) - Balance while stepping forward/back and can walk in room with assist - Complete                 Objective: Vitals last 24 hrs: Vitals:   04/23/22 1925 04/24/22 0049 04/24/22 0600 04/24/22 1045  BP: (!) 140/80 133/68 123/62   Pulse: 82 77 79   Resp: '18 16 16   '$ Temp: (!) 97.5 F (36.4 C) 98.6 F (37 C) 98.4 F (36.9 C) 98.7 F (37.1 C)  TempSrc: Oral Oral Oral Oral  SpO2: 100% 99% 100%   Weight:      Height:       Weight change:   Physical Examination: General exam: alert awake,older than stated age, weak appearing. HEENT:Oral mucosa moist, Ear/Nose WNL grossly, dentition normal. Respiratory system: bilaterally diminished BS, no use of accessory muscle Cardiovascular system: S1 & S2 +, No JVD. Gastrointestinal system: Abdomen soft,NT,ND, BS+ Nervous System:Alert, awake, moving extremities and grossly nonfocal Extremities: LE edema  neg,distal peripheral pulses palpable.  Skin: No rashes,no icterus. MSK: Normal muscle bulk,tone, power  Medications reviewed:  Scheduled Meds:  atorvastatin  40 mg Oral QHS   benztropine  1 mg Oral BID   clopidogrel  75 mg Oral Daily   fluPHENAZine  20 mg Oral  Daily   lurasidone  120 mg Oral Daily   pantoprazole (PROTONIX) IV  40 mg Intravenous Q12H   sertraline  100 mg Oral Daily   Continuous Infusions:  cefTRIAXone (ROCEPHIN)  IV Stopped (04/23/22 2228)   lactated ringers 50 mL/hr at 04/24/22 0438      Diet Order             Diet regular Room service appropriate? Yes; Fluid consistency: Thin  Diet effective now                           No intake or output data in the 24 hours ending 04/24/22 1111 Net IO Since Admission: 773.33 mL [04/24/22 1111]  Wt Readings from Last 3 Encounters:  04/22/22 67.6 kg  10/12/20 67.6 kg  08/13/20 68.1 kg     Unresulted Labs (From admission, onward)     Start     Ordered   04/24/22 2100  Lithium level  Once-Timed,   TIMED        04/23/22 1837   04/24/22 8882  Basic metabolic panel  Daily at 5am,   R      04/23/22 1019   04/23/22 1020  PTH, intact and calcium  Once,   R        04/23/22 1019   04/23/22 0500  Blood gas, venous  Tomorrow morning,   R        04/23/22 0206          Data Reviewed: I have personally reviewed following labs and imaging studies CBC: Recent Labs  Lab 04/22/22 2250 04/23/22 0318 04/23/22 0325 04/23/22 1056  WBC 8.9 10.9*  --   --   NEUTROABS 5.3 6.3  --   --   HGB 12.8 12.9 13.3 13.1  HCT 40.9 41.0 39.0 40.9  MCV 99.8 99.0  --   --   PLT 300 299  --   --    Basic Metabolic Panel: Recent Labs  Lab 04/23/22 0033 04/23/22 0318 04/23/22 0325 04/24/22 0249  NA 140 139 139 139  K 4.5 4.3 4.4 4.1  CL 111 110  --  112*  CO2 22 22  --  20*  GLUCOSE 93 98  --  119*  BUN 11 10  --  10  CREATININE 1.21* 1.17*  --  1.09*  CALCIUM 10.6* 10.4*  --  10.6*  MG  --  2.1  --   --   PHOS  --  2.5  --   --    GFR: Estimated Creatinine Clearance: 42.6 mL/min (A) (by C-G formula based on SCr of 1.09 mg/dL (H)). Liver Function Tests: Recent Labs  Lab 04/23/22 0033 04/23/22 0318  AST 23 28  ALT 24 28  ALKPHOS 103 120  BILITOT 0.4 0.5  PROT 6.5 7.3   ALBUMIN 3.4* 3.8   No results for input(s): "LIPASE", "AMYLASE" in the last 168 hours. No results for input(s): "AMMONIA" in the last 168 hours. Coagulation Profile: No results for input(s): "INR", "PROTIME" in the last 168 hours. BNP (last 3 results) No results for input(s): "PROBNP" in the last 8760 hours. HbA1C: No results for input(s): "  HGBA1C" in the last 72 hours. CBG: Recent Labs  Lab 04/22/22 2052 04/22/22 2244  GLUCAP 102* 97   Lipid Profile: No results for input(s): "CHOL", "HDL", "LDLCALC", "TRIG", "CHOLHDL", "LDLDIRECT" in the last 72 hours. Thyroid Function Tests: Recent Labs    04/23/22 1223  TSH 1.433   Sepsis Labs: Recent Labs  Lab 04/22/22 2248  LATICACIDVEN 0.9    Recent Results (from the past 240 hour(s))  Urine Culture     Status: Abnormal (Preliminary result)   Collection Time: 04/22/22 10:47 PM   Specimen: Urine, Clean Catch  Result Value Ref Range Status   Specimen Description URINE, CLEAN CATCH  Final   Special Requests NONE  Final   Culture (A)  Final    10,000 COLONIES/mL PROTEUS MIRABILIS SUSCEPTIBILITIES TO FOLLOW Performed at [facility], [place] [address], [place], [place] [address]    Report Status PENDING  Incomplete    Antimicrobials: Anti-infectives (From admission, onward)    Start     Dose/Rate Route Frequency Ordered Stop   04/23/22 1900  cefTRIAXone (ROCEPHIN) 1 g in sodium chloride 0.9 % 100 mL IVPB        1 g 200 mL/hr over 30 Minutes Intravenous Every 24 hours 04/23/22 0148     04/22/22 2345  cefTRIAXone (ROCEPHIN) 1 g in sodium chloride 0.9 % 100 mL IVPB        1 g 200 mL/hr over 30 Minutes Intravenous  Once 04/22/22 2334 04/23/22 0137      Culture/Microbiology    Component Value Date/Time   SDES URINE, CLEAN CATCH 04/22/2022 2247   SPECREQUEST NONE 04/22/2022 2247   CULT (A) 04/22/2022 2247    10,000 COLONIES/mL PROTEUS MIRABILIS SUSCEPTIBILITIES TO FOLLOW Performed at [facility]  [address], [place], [place] [address]    REPTSTATUS PENDING 04/22/2022 2247    Other culture-see note  Radiology Studies: DG Chest Portable 1 View  Result Date: 04/22/2022 CLINICAL DATA:  Altered mental status EXAM: PORTABLE CHEST 1 VIEW COMPARISON:  02/17/2021 FINDINGS: Severe thoracolumbar scoliosis with posterior spinal rods. Heart is normal size. Tortuous aorta. Lungs clear. No effusions or acute bony abnormality. IMPRESSION: No active disease. Electronically Signed   By: Rolm Baptise M.D.   On: 04/22/2022 22:25   CT HEAD WO CONTRAST  Result Date: 04/22/2022 CLINICAL DATA:  Altered mental status EXAM: CT HEAD WITHOUT CONTRAST TECHNIQUE: Contiguous axial images were obtained from the base of the skull through the vertex without intravenous contrast. RADIATION DOSE REDUCTION: This exam was performed according to the departmental dose-optimization program which includes automated exposure control, adjustment of the mA and/or kV according to patient size and/or use of iterative reconstruction technique. COMPARISON:  02/17/2021 FINDINGS: Brain: No evidence of acute infarction, hemorrhage, hydrocephalus, extra-axial collection or mass lesion/mass effect. Subcortical white matter and periventricular small vessel ischemic changes. Vascular: Intracranial atherosclerosis. Skull: Normal. Negative for fracture or focal lesion. Sinuses/Orbits: The visualized paranasal sinuses are essentially clear. The mastoid air cells are unopacified. Other: None. IMPRESSION: No acute intracranial abnormality. Small vessel ischemic changes. Electronically Signed   By: Julian Hy M.D.   On: 04/22/2022 22:18     LOS: 1 day   Antonieta Pert, MD [facility]  04/24/2022, 11:11 AM

## 2022-04-24 NOTE — Evaluation (Signed)
Occupational Therapy Evaluation Patient Details Name: Heidi Blevins MRN: [id number] DOB: August 22, 1951 Today's Date: 04/24/2022   History of Present Illness 71 y/o female admitted secondary to [place]. PMH includes HTN, schizoaffective disorder, and CHF.   Clinical Impression   PTA, pt was living at ALF and received assistance for ADLs and using RW. Pt currently requiring Min A for UB ADLs, Min A for LB ADLs, and Min Guard-Min A for functional mobility. Pt presenting with decreased balance, strength, and cognition. Pt would benefit from further acute OT to facilitate safe dc. Recommend dc to ALF once medically stable per physician.     Recommendations for follow up therapy are one component of a multi-disciplinary discharge planning process, led by the attending physician.  Recommendations may be updated based on patient status, additional functional criteria and insurance authorization.   Follow Up Recommendations  Other (comment) (Return to ALF)    Assistance Recommended at Discharge Frequent or constant Supervision/Assistance  Patient can return home with the following      Functional Status Assessment  Patient has had a recent decline in their functional status and demonstrates the ability to make significant improvements in function in a reasonable and predictable amount of time.  Equipment Recommendations  None recommended by OT    Recommendations for Other Services       Precautions / Restrictions Precautions Precautions: Fall Restrictions Weight Bearing Restrictions: No      Mobility Bed Mobility Overal bed mobility: Needs Assistance Bed Mobility: Supine to Sit, Sit to Supine     Supine to sit: Supervision Sit to supine: Supervision   General bed mobility comments: Supervision for safety.    Transfers Overall transfer level: Needs assistance Equipment used: None Transfers: Sit to/from Stand Sit to Stand: Min guard           General transfer comment: Min  guard for safety.      Balance Overall balance assessment: Needs assistance Sitting-balance support: No upper extremity supported, Feet supported Sitting balance-Leahy Scale: Fair     Standing balance support: No upper extremity supported Standing balance-Leahy Scale: Fair                             ADL either performed or assessed with clinical judgement   ADL Overall ADL's : Needs assistance/impaired Eating/Feeding: Set up;Sitting   Grooming: Wash/dry hands;Min guard;Cueing for sequencing;Standing   Upper Body Bathing: Minimal assistance;Sitting   Lower Body Bathing: Minimal assistance;Sit to/from stand   Upper Body Dressing : Minimal assistance;Sitting   Lower Body Dressing: Moderate assistance;Sit to/from stand   Toilet Transfer: Min guard;Ambulation;Regular Toilet   Toileting- Water quality scientist and Hygiene: Min guard;Sitting/lateral lean       Functional mobility during ADLs: Min guard General ADL Comments: Pt performing at [place] A level. Feel shei s near baseline function     Vision Baseline Vision/History: 1 Wears glasses       Perception     Praxis      Pertinent Vitals/Pain Pain Assessment Pain Assessment: No/denies pain     Hand Dominance Right   Extremity/Trunk Assessment Upper Extremity Assessment Upper Extremity Assessment: Generalized weakness   Lower Extremity Assessment Lower Extremity Assessment: Defer to PT evaluation   Cervical / Trunk Assessment Cervical / Trunk Assessment: Kyphotic   Communication Communication Communication: No difficulties   Cognition Arousal/Alertness: Awake/alert Behavior During Therapy: Flat affect Overall Cognitive Status: No family/caregiver present to determine baseline cognitive functioning  General Comments: Only oriented to self. Conversational, following simple commands, and requiring increased time for processing. Pt with  schizoaffective affective disorder at baseline. Pt reporting "the people talking to me" as why she came to the hospital - confirming hearing "voices in my head".     General Comments       Exercises     Shoulder Instructions      Home Living Family/patient expects to be discharged to:: Assisted living                             Home Equipment: Rolling Walker (2 wheels)          Prior Functioning/Environment Prior Level of Function : Needs assist             Mobility Comments: Normally ambulates independently, reports occasionally using RW ADLs Comments: Requires assist with ADL tasks        OT Problem List: Decreased strength;Decreased range of motion;Decreased activity tolerance;Impaired balance (sitting and/or standing);Decreased knowledge of use of DME or AE;Decreased knowledge of precautions      OT Treatment/Interventions: Self-care/ADL training;Therapeutic exercise;Energy conservation;DME and/or AE instruction;Therapeutic activities    OT Goals(Current goals can be found in the care plan section) Acute Rehab OT Goals Patient Stated Goal: Go home OT Goal Formulation: With patient Time For Goal Achievement: 05/08/22 Potential to Achieve Goals: Good  OT Frequency: Min 2X/week    Co-evaluation              AM-PAC OT "6 Clicks" Daily Activity     Outcome Measure Help from another person eating meals?: A Little Help from another person taking care of personal grooming?: A Little Help from another person toileting, which includes using toliet, bedpan, or urinal?: A Little Help from another person bathing (including washing, rinsing, drying)?: A Little Help from another person to put on and taking off regular upper body clothing?: A Little Help from another person to put on and taking off regular lower body clothing?: A Lot 6 Click Score: 17   End of Session Nurse Communication: Mobility status  Activity Tolerance: Patient tolerated treatment  well Patient left: in bed;with call bell/phone within reach  OT Visit Diagnosis: Unsteadiness on feet (R26.81);Other abnormalities of gait and mobility (R26.89);Muscle weakness (generalized) (M62.81)                Time: 1001-1015 OT Time Calculation (min): 14 min Charges:  OT General Charges $OT Visit: 1 Visit OT Evaluation $OT Eval Low Complexity: 1 Low  Chou Busler MSOT, OTR/L Acute Rehab Office: [place] 04/24/2022, 2:04 PM

## 2022-04-25 ENCOUNTER — Other Ambulatory Visit (HOSPITAL_COMMUNITY): Payer: Self-pay

## 2022-04-25 DIAGNOSIS — G9341 Metabolic encephalopathy: Secondary | ICD-10-CM | POA: Diagnosis not present

## 2022-04-25 DIAGNOSIS — F259 Schizoaffective disorder, unspecified: Secondary | ICD-10-CM | POA: Diagnosis not present

## 2022-04-25 LAB — BASIC METABOLIC PANEL
Anion gap: 7 (ref 5–15)
BUN: 8 mg/dL (ref 8–23)
CO2: 22 mmol/L (ref 22–32)
Calcium: 10.7 mg/dL — ABNORMAL HIGH (ref 8.9–10.3)
Chloride: 113 mmol/L — ABNORMAL HIGH (ref 98–111)
Creatinine, Ser: 0.92 mg/dL (ref 0.44–1.00)
GFR, Estimated: >60 mL/min (ref >60)
Glucose, Bld: 106 mg/dL — ABNORMAL HIGH (ref 70–99)
Potassium: 3.9 mmol/L (ref 3.5–5.1)
Sodium: 142 mmol/L (ref 135–145)

## 2022-04-25 LAB — URINE CULTURE: Culture: 10000 — AB

## 2022-04-25 LAB — VITAMIN D 25 HYDROXY (VIT D DEFICIENCY, FRACTURES): Vit D, 25-Hydroxy: 22.6 ng/mL — ABNORMAL LOW (ref 30–100)

## 2022-04-25 MED ORDER — CEFADROXIL 500 MG PO CAPS
500.0000 mg | ORAL_CAPSULE | Freq: Two times a day (BID) | ORAL | 0 refills | Status: AC
  Filled 2022-04-25: qty 10, 5d supply, fill #0

## 2022-04-25 MED ORDER — LITHIUM CARBONATE 150 MG PO CAPS
150.0000 mg | ORAL_CAPSULE | Freq: Every day | ORAL | 0 refills | Status: AC
  Filled 2022-04-25: qty 30, 30d supply, fill #0

## 2022-04-25 NOTE — Discharge Summary (Signed)
Physician Discharge Summary  Heidi Blevins NFA:[id number] DOB: 10-15-1950 DOA: 04/22/2022  PCP: [name], MD  Admit date: 04/22/2022 Discharge date: 04/25/2022 Recommendations for Outpatient Follow-up:  Follow up with PCP in 1 weeks-call for appointment Please obtain BMP/CBC in one week Follow-up pending PTH level and vitamin D level Follow-up with your psychiatrist.  Discharge Dispo: h/h Discharge Condition: Stable Code Status:   Code Status: Full Code Diet recommendation:  Diet Order             Diet regular Room service appropriate? Yes; Fluid consistency: Thin  Diet effective now                 Brief/Interim Summary:71 F with chronic diastolic heart failure, schizoaffective disorder, admitted from independent living facility with acute encephalopathy, potentially multifactorial in etiology, with suspected contributions from UTI, dehydration, acute kidney injury, mild hypercalcemia, and potentially polypharmacy in the setting of her AKI and hypercalcemia, ordered lithium level, started on Rocephin, gentle IVF's and admitted for further management.  Patient clinically improved with IV fluid hydration, IV antibiotics.  Urine culture came back positive with Proteus mirabilis sensitive to Keflex Cipro ampicillin.  PT OT sustained home health PT OT.  At this time mental status improved at baseline and she will be discharged home.  Of note lithium level is slightly supratherapeutic psychiatry is consulted-lithium decreased to 150 mg, advised to discontinue Invega inj- son was informed as well.  Discharge Diagnoses:  Principal Problem:   Acute metabolic encephalopathy Active Problems:   Hypercalcemia   Schizoaffective disorder (HCC)   AKI (acute kidney injury) ([place])   Essential hypertension   Acute cystitis   Chronic diastolic CHF (congestive heart failure) (HCC)   GAD (generalized anxiety disorder)   HLD (hyperlipidemia)  Acute metabolic encephalopathy: Suspect  multifactorial in the setting of dehydration, UTI.Clinically improved continue supportive care PT OT, suggested home health PT OT.   Acute cystitis urine/POA due to Proteus: Discharged on cefadroxil  Acute kidney injury: Creat has nicely trended down and improved Recent Labs  Lab 04/23/22 0033 04/23/22 0318 04/24/22 0249 04/25/22 0311  BUN '11 10 10 8  '$ CREATININE 1.21* 1.17* 1.09* 0.92    Hypercalcemia, mild, has been chronic issues-ionized calcium normal 1.24, intact PTH/ca pending, vitamin d pending- calcium at 10.7- she will need fu closely to monitor, encourage oral hydration. Her calcium from 2021 has been in 11.3> down to 10.3.  Highly suspect this is due to supratherapeutic lithium level, will need close follow-up with lithium level and calcium level and this is communicated to patient's son Brain who verbalized the understanding.   Chronic diastolic CHF-cxr clear,On room air and no shortness of breath.  Tolerating gentle hydration   Schizoaffective disorder/GAD: Mood is stable on Prolixin, Cogentin, Zoloft Latuda and lithium.  Holding lithium due to supratherapeutic level and decreased to 150 mg, Invega discontinued son was informed, discussed with psychiatrist and appreciate their input.     Hyperlipidemia: Continue her atorvastatin   Generalized weakness/debility Continue PT OT plan for home health PT OT upon discharge  Consults: psuchiatry Subjective: Alert awake oriented resting comfortably feels improved.  Discharge Exam: Vitals:   04/24/22 2352 04/25/22 0400  BP: (!) 141/91 (!) 144/72  Pulse: 84 73  Resp: 13 19  Temp: 98.4 F (36.9 C) 98.3 F (36.8 C)  SpO2: 100% 96%   General: Pt is alert, awake, not in acute distress Cardiovascular: RRR, S1/S2 +, no rubs, no gallops Respiratory: CTA bilaterally, no wheezing, no rhonchi Abdominal: Soft,  NT, ND, bowel sounds + Extremities: no edema, no cyanosis  Discharge Instructions  Discharge Instructions     Discharge  instructions   Complete by: As directed    Please call call MD or return to ER for similar or worsening recurring problem that brought you to hospital or if any fever,nausea/vomiting,abdominal pain, uncontrolled pain, chest pain,  shortness of breath or any other alarming symptoms.  Please follow-up your doctor as instructed in a week time and call the office for appointment.  Please avoid alcohol, smoking, or any other illicit substance and maintain healthy habits including taking your regular medications as prescribed.  You were cared for by a hospitalist during your hospital stay. If you have any questions about your discharge medications or the care you received while you were in the hospital after you are discharged, you can call the unit and ask to speak with the hospitalist on call if the hospitalist that took care of you is not available.  Once you are discharged, your primary care physician will handle any further medical issues. Please note that NO REFILLS for any discharge medications will be authorized once you are discharged, as it is imperative that you return to your primary care physician (or establish a relationship with a primary care physician if you do not have one) for your aftercare needs so that they can reassess your need for medications and monitor your lab values   Increase activity slowly   Complete by: As directed       Allergies as of 04/25/2022       Reactions   Sulfonamide Derivatives Hives            Medication List     STOP taking these medications    Invega Sustenna 156 MG/ML Susy injection Generic drug: paliperidone   lithium carbonate 300 MG CR tablet Commonly known as: LITHOBID       TAKE these medications    acetaminophen 325 MG tablet Commonly known as: TYLENOL Take 650 mg by mouth every 4 (four) hours as needed for mild pain, moderate pain, fever or headache.   atorvastatin 40 MG tablet Commonly known as: LIPITOR Take 1 tablet (40  mg total) by mouth daily. What changed: when to take this   benztropine 1 MG tablet Commonly known as: COGENTIN Take 1 mg by mouth 2 (two) times daily.   cefadroxil 500 MG capsule Commonly known as: DURICEF Take 1 capsule (500 mg total) by mouth 2 (two) times daily for 5 days.   clopidogrel 75 MG tablet Commonly known as: PLAVIX Take 1 tablet (75 mg total) by mouth daily.   Cranberry 425 MG Caps Take 425 mg by mouth 2 (two) times a day.   Ferocon capsule Generic drug: ferrous [id number] C-folic acid Take 1 capsule by mouth at bedtime.   fluPHENAZine 10 MG tablet Commonly known as: PROLIXIN Take 20 mg by mouth daily.   Latuda 120 MG Tabs Generic drug: Lurasidone HCl Take 120 mg by mouth daily.   lisinopril 20 MG tablet Commonly known as: ZESTRIL Take 20 mg by mouth daily.   loratadine 10 MG tablet Commonly known as: CLARITIN Take 10 mg by mouth daily.   LORazepam 0.5 MG tablet Commonly known as: ATIVAN Take 0.5 mg by mouth in the morning, at noon, in the evening, and at bedtime.   melatonin 5 MG Tabs Take 5 mg by mouth at bedtime.   multivitamins ther. w/minerals Tabs tablet Take 1 tablet by mouth daily.  OVER THE COUNTER MEDICATION Take 1 Bottle by mouth 3 (three) times daily. Mighty shakes   polyethylene glycol powder 17 GM/SCOOP powder Commonly known as: GLYCOLAX/MIRALAX Take 17 g by mouth daily.   sertraline 100 MG tablet Commonly known as: ZOLOFT Take 100 mg by mouth daily.   simethicone 80 MG chewable tablet Commonly known as: MYLICON Chew 1 tablet (80 mg total) by mouth 4 (four) times daily.   SYSTANE BALANCE OP Place 1 drop into both eyes 3 (three) times daily.   Vagisil Anti-Itch Medicated 1 % Misc Generic drug: Pramoxine HCl Apply vaginally once a day Apply 1 application topically daily as needed (for vaginal itching). Apply vaginally once a day What changed:  how much to take how to take this when to take this reasons to  take this additional instructions        Allergies  Allergen Reactions   Sulfonamide Derivatives Hives         The results of significant diagnostics from this hospitalization (including imaging, microbiology, ancillary and laboratory) are listed below for reference.    Microbiology: Recent Results (from the past 240 hour(s))  Urine Culture     Status: Abnormal   Collection Time: 04/22/22 10:47 PM   Specimen: Urine, Clean Catch  Result Value Ref Range Status   Specimen Description URINE, CLEAN CATCH  Final   Special Requests   Final    NONE Performed at [facility], [address] [place], [place] [address]    Culture 10,000 COLONIES/mL PROTEUS MIRABILIS (A)  Final   Report Status 04/25/2022 FINAL  Final   Organism ID, Bacteria PROTEUS MIRABILIS (A)  Final      Susceptibility   Proteus mirabilis - MIC*    AMPICILLIN <=2 SENSITIVE Sensitive     CEFAZOLIN <=4 SENSITIVE Sensitive     CEFEPIME <=0.12 SENSITIVE Sensitive     CEFTRIAXONE <=0.25 SENSITIVE Sensitive     CIPROFLOXACIN <=0.25 SENSITIVE Sensitive     GENTAMICIN <=1 SENSITIVE Sensitive     IMIPENEM 2 SENSITIVE Sensitive     NITROFURANTOIN 128 RESISTANT Resistant     TRIMETH/SULFA <=20 SENSITIVE Sensitive     AMPICILLIN/SULBACTAM <=2 SENSITIVE Sensitive     PIP/TAZO <=4 SENSITIVE Sensitive     * 10,000 COLONIES/mL PROTEUS MIRABILIS    Procedures/Studies: DG Chest Portable 1 View  Result Date: 04/22/2022 CLINICAL DATA:  Altered mental status EXAM: PORTABLE CHEST 1 VIEW COMPARISON:  02/17/2021 FINDINGS: Severe thoracolumbar scoliosis with posterior spinal rods. Heart is normal size. Tortuous aorta. Lungs clear. No effusions or acute bony abnormality. IMPRESSION: No active disease. Electronically Signed   By: Rolm Baptise M.D.   On: 04/22/2022 22:25   CT HEAD WO CONTRAST  Result Date: 04/22/2022 CLINICAL DATA:  Altered mental status EXAM: CT HEAD WITHOUT CONTRAST TECHNIQUE: Contiguous axial images were  obtained from the base of the skull through the vertex without intravenous contrast. RADIATION DOSE REDUCTION: This exam was performed according to the departmental dose-optimization program which includes automated exposure control, adjustment of the mA and/or kV according to patient size and/or use of iterative reconstruction technique. COMPARISON:  02/17/2021 FINDINGS: Brain: No evidence of acute infarction, hemorrhage, hydrocephalus, extra-axial collection or mass lesion/mass effect. Subcortical white matter and periventricular small vessel ischemic changes. Vascular: Intracranial atherosclerosis. Skull: Normal. Negative for fracture or focal lesion. Sinuses/Orbits: The visualized paranasal sinuses are essentially clear. The mastoid air cells are unopacified. Other: None. IMPRESSION: No acute intracranial abnormality. Small vessel ischemic changes. Electronically Signed  By: Julian Hy M.D.   On: 04/22/2022 22:18    Labs: BNP (last 3 results) No results for input(s): "BNP" in the last 8760 hours. Basic Metabolic Panel: Recent Labs  Lab 04/23/22 0033 04/23/22 0318 04/23/22 0325 04/24/22 0249 04/25/22 0311  NA 140 139 139 139 142  K 4.5 4.3 4.4 4.1 3.9  CL 111 110  --  112* 113*  CO2 22 22  --  20* 22  GLUCOSE 93 98  --  119* 106*  BUN 11 10  --  10 8  CREATININE 1.21* 1.17*  --  1.09* 0.92  CALCIUM 10.6* 10.4*  --  10.6* 10.7*  MG  --  2.1  --   --   --   PHOS  --  2.5  --   --   --    Liver Function Tests: Recent Labs  Lab 04/23/22 0033 04/23/22 0318  AST 23 28  ALT 24 28  ALKPHOS 103 120  BILITOT 0.4 0.5  PROT 6.5 7.3  ALBUMIN 3.4* 3.8   No results for input(s): "LIPASE", "AMYLASE" in the last 168 hours. No results for input(s): "AMMONIA" in the last 168 hours. CBC: Recent Labs  Lab 04/22/22 2250 04/23/22 0318 04/23/22 0325 04/23/22 1056  WBC 8.9 10.9*  --   --   NEUTROABS 5.3 6.3  --   --   HGB 12.8 12.9 13.3 13.1  HCT 40.9 41.0 39.0 40.9  MCV 99.8 99.0   --   --   PLT 300 299  --   --    Cardiac Enzymes: Recent Labs  Lab 04/23/22 0318  CKTOTAL 46   BNP: Invalid input(s): "POCBNP" CBG: Recent Labs  Lab 04/22/22 2052 04/22/22 2244  GLUCAP 102* 97   D-Dimer No results for input(s): "DDIMER" in the last 72 hours. Hgb A1c No results for input(s): "HGBA1C" in the last 72 hours. Lipid Profile No results for input(s): "CHOL", "HDL", "LDLCALC", "TRIG", "CHOLHDL", "LDLDIRECT" in the last 72 hours. Thyroid function studies Recent Labs    04/23/22 1223  TSH 1.433   Anemia work up No results for input(s): "VITAMINB12", "FOLATE", "FERRITIN", "TIBC", "IRON", "RETICCTPCT" in the last 72 hours. Urinalysis    Component Value Date/Time   COLORURINE AMBER (A) 04/22/2022 2250   APPEARANCEUR CLOUDY (A) 04/22/2022 2250   LABSPEC 1.012 04/22/2022 2250   PHURINE 8.0 04/22/2022 2250   GLUCOSEU NEGATIVE 04/22/2022 2250   HGBUR NEGATIVE 04/22/2022 2250   BILIRUBINUR NEGATIVE 04/22/2022 2250   KETONESUR 5 (A) 04/22/2022 2250   PROTEINUR >=300 (A) 04/22/2022 2250   UROBILINOGEN 1.0 08/05/2011 1751   NITRITE NEGATIVE 04/22/2022 2250   LEUKOCYTESUR MODERATE (A) 04/22/2022 2250   Sepsis Labs Recent Labs  Lab 04/22/22 2250 04/23/22 0318  WBC 8.9 10.9*   Microbiology Recent Results (from the past 240 hour(s))  Urine Culture     Status: Abnormal   Collection Time: 04/22/22 10:47 PM   Specimen: Urine, Clean Catch  Result Value Ref Range Status   Specimen Description URINE, CLEAN CATCH  Final   Special Requests   Final    NONE Performed at [facility], [place] [address], [place], [place] [address]    Culture 10,000 COLONIES/mL PROTEUS MIRABILIS (A)  Final   Report Status 04/25/2022 FINAL  Final   Organism ID, Bacteria PROTEUS MIRABILIS (A)  Final      Susceptibility   Proteus mirabilis - MIC*    AMPICILLIN <=2 SENSITIVE Sensitive     CEFAZOLIN <=  4 SENSITIVE Sensitive     CEFEPIME <=0.12 SENSITIVE Sensitive     CEFTRIAXONE  <=0.25 SENSITIVE Sensitive     CIPROFLOXACIN <=0.25 SENSITIVE Sensitive     GENTAMICIN <=1 SENSITIVE Sensitive     IMIPENEM 2 SENSITIVE Sensitive     NITROFURANTOIN 128 RESISTANT Resistant     TRIMETH/SULFA <=20 SENSITIVE Sensitive     AMPICILLIN/SULBACTAM <=2 SENSITIVE Sensitive     PIP/TAZO <=4 SENSITIVE Sensitive     * 10,000 COLONIES/mL PROTEUS MIRABILIS     Time coordinating discharge: 25 minutes  SIGNED: Antonieta Pert, MD  [facility] 04/25/2022, 2:30 PM  If 7PM-7AM, please contact night-coverage [email or web address]

## 2022-04-25 NOTE — Progress Notes (Signed)
Received report on pt at 1900 from previous RN, agree with previous assessment. Pt is waiting for transport via PTAR, VSS, off telemetry at this time.

## 2022-04-25 NOTE — Plan of Care (Signed)

## 2022-04-25 NOTE — NC FL2 (Signed)
[place] LEVEL OF CARE SCREENING TOOL     IDENTIFICATION  Patient Name: Heidi Blevins Birthdate: [birth year] Sex: female Admission Date (Current Location): 04/22/2022  [facility] and [place] Number:  Herbalist and Address:  [facility], [place] [address], [place], [place] [address]      Provider Number: [id number]  Attending Physician Name and Address:  Antonieta Pert, MD  Relative Name and Phone Number:       Current Level of Care: Hospital Recommended Level of Care: Assisted Living Facility Prior Approval Number:    Date Approved/Denied:   PASRR Number:    Discharge Plan: Other (Comment) (ALF)    Current Diagnoses: Patient Active Problem List   Diagnosis Date Noted   Acute cystitis 04/23/2022   Chronic diastolic CHF (congestive heart failure) ([place]) 04/23/2022   GAD (generalized anxiety disorder) 04/23/2022   HLD (hyperlipidemia) 04/23/2022   Polypharmacy 08/09/2020   Acute CVA (cerebrovascular accident) ([place]) 08/09/2020   Thyroid nodule 08/09/2020   CVA (cerebral vascular accident) ([place]) 08/08/2020   Bradycardia    Acute metabolic encephalopathy    Sepsis secondary to UTI ([place]) 03/20/2019   Closed displaced fracture of left femoral neck ([place]) 09/26/2018   AKI (acute kidney injury) ([place]) 06/13/2016   UTI (urinary tract infection) 06/13/2016   Essential hypertension 06/13/2016   Urinary tract infection without hematuria    Complicated UTI (urinary tract infection) 05/03/2016   Hypercalcemia 05/03/2016   Dehydration 05/03/2016   Acute encephalopathy 05/03/2016   Cerebrovascular disease 05/03/2016   Schizoaffective disorder ([place]) 05/03/2016   Bifascicular block 05/03/2016    Orientation RESPIRATION BLADDER Height & Weight     Self  Normal Continent Weight: 149 lb 0.5 oz (67.6 kg) Height:  '5\' 5"'$  (165.1 cm)  BEHAVIORAL SYMPTOMS/MOOD NEUROLOGICAL BOWEL NUTRITION STATUS      Incontinent Diet (Regular)  AMBULATORY  STATUS COMMUNICATION OF NEEDS Skin   Limited Assist Verbally Normal                       Personal Care Assistance Level of Assistance  Bathing, Feeding, Dressing Bathing Assistance: Limited assistance Feeding assistance: Independent Dressing Assistance: Limited assistance     Functional Limitations Info             [place]  PT (By licensed PT), OT (By licensed OT)     PT Frequency: Home Health PT OT Frequency: Home Health OT            Contractures Contractures Info: Not present    Additional Factors Info  Code Status, Allergies Code Status Info: Full Allergies Info: Sulfonamide Derivatives           Current Medications (04/25/2022):    Discharge Medications: STOP taking these medications     Invega Sustenna 156 MG/ML Susy injection Generic drug: paliperidone    lithium carbonate 300 MG CR tablet Commonly known as: LITHOBID           TAKE these medications     acetaminophen 325 MG tablet Commonly known as: TYLENOL Take 650 mg by mouth every 4 (four) hours as needed for mild pain, moderate pain, fever or headache.    atorvastatin 40 MG tablet Commonly known as: LIPITOR Take 1 tablet (40 mg total) by mouth daily. What changed: when to take this    benztropine 1 MG tablet Commonly known as: COGENTIN Take 1 mg by mouth 2 (two) times daily.  cefadroxil 500 MG capsule Commonly known as: DURICEF Take 1 capsule (500 mg total) by mouth 2 (two) times daily for 5 days.    clopidogrel 75 MG tablet Commonly known as: PLAVIX Take 1 tablet (75 mg total) by mouth daily.    Cranberry 425 MG Caps Take 425 mg by mouth 2 (two) times a day.    Ferocon capsule Generic drug: ferrous [id number] C-folic acid Take 1 capsule by mouth at bedtime.    fluPHENAZine 10 MG tablet Commonly known as: PROLIXIN Take 20 mg by mouth daily.    Latuda 120 MG Tabs Generic drug: Lurasidone HCl Take 120 mg by mouth daily.     lisinopril 20 MG tablet Commonly known as: ZESTRIL Take 20 mg by mouth daily.    loratadine 10 MG tablet Commonly known as: CLARITIN Take 10 mg by mouth daily.    LORazepam 0.5 MG tablet Commonly known as: ATIVAN Take 0.5 mg by mouth in the morning, at noon, in the evening, and at bedtime.    melatonin 5 MG Tabs Take 5 mg by mouth at bedtime.    multivitamins ther. w/minerals Tabs tablet Take 1 tablet by mouth daily.    OVER THE COUNTER MEDICATION Take 1 Bottle by mouth 3 (three) times daily. Mighty shakes    polyethylene glycol powder 17 GM/SCOOP powder Commonly known as: GLYCOLAX/MIRALAX Take 17 g by mouth daily.    sertraline 100 MG tablet Commonly known as: ZOLOFT Take 100 mg by mouth daily.    simethicone 80 MG chewable tablet Commonly known as: MYLICON Chew 1 tablet (80 mg total) by mouth 4 (four) times daily.    SYSTANE BALANCE OP Place 1 drop into both eyes 3 (three) times daily.    Vagisil Anti-Itch Medicated 1 % Misc Generic drug: Pramoxine HCl Apply vaginally once a day Apply 1 application topically daily as needed (for vaginal itching). Apply vaginally once a day What changed:  how much to take how to take this when to take this reasons to take this additional instructions    Relevant Imaging Results:  Relevant Lab Results:   Additional Information SSN: [id number]. Return to ALF with home health PT/OT  [name], LCSW

## 2022-04-25 NOTE — TOC Initial Note (Signed)
Transition of Care [facility]) - Initial/Assessment Note    Patient Details  Name: Heidi Blevins MRN: [id number] Date of Birth: September 24, 1950  Transition of Care [facility]) CM/SW Contact:    [name], LCSW Phone Number: 04/25/2022, 3:19 PM  Clinical Narrative:                 CSW spoke with patient's son, [name] (No voicemail available on Damian's number). He stated to proceed with PTAR for transport back to [facility] since patient is weak. CSW made him aware of possible wait. He stated he will check back in later and if it gets too late he or his brother may come pick patient up and can cancel PTAR.   CSW spoke with Wells Guiles at [facility] and she confirmed patient can return today. CSW faxed her DC Summary, FL2, and home health orders (f. [phone]). She stated patient can return at anytime.   Expected Discharge Plan: Assisted Living Barriers to Discharge: No Barriers Identified   Patient Goals and CMS Choice Patient states their goals for this hospitalization and ongoing recovery are:: Return to ALF CMS Medicare.gov Compare Post Acute Care list provided to:: Patient Represenative (must comment) Choice offered to / list presented to : Adult Children  Expected Discharge Plan and Services Expected Discharge Plan: Assisted Living In-house Referral: Clinical Social Work   Post Acute Care Choice: [place] arrangements for the past 2 months: [place] Expected Discharge Date: 04/25/22                                    Prior Living Arrangements/Services Living arrangements for the past 2 months: [place] Lives with:: Facility Resident Patient language and need for interpreter reviewed:: Yes Do you feel safe going back to the place where you live?: Yes      Need for Family Participation in Patient Care: Yes (Comment) Care giver support system in place?: Yes (comment) Current home services: DME Criminal Activity/Legal  Involvement Pertinent to Current Situation/Hospitalization: No - Comment as needed  Activities of Daily Living      Permission Sought/Granted Permission sought to share information with : Facility Sport and exercise psychologist, Family Supports Permission granted to share information with : Yes, Verbal Permission Granted  Share Information with NAME: [name]  Permission granted to share info w AGENCY: ALF  Permission granted to share info w Relationship: Son  Permission granted to share info w Contact Information: [phone]  Emotional Assessment Appearance:: Appears stated age Attitude/Demeanor/Rapport: Unable to Assess Affect (typically observed): Unable to Assess Orientation: : Oriented to Self Alcohol / Substance Use: Not Applicable Psych Involvement: Yes (comment)  Admission diagnosis:  Dehydration [E86.0] Acute cystitis with hematuria [N30.01] AKI (acute kidney injury) ([place]) [N17.9] Altered mental status, unspecified altered mental status type [O53.66] Acute metabolic encephalopathy [Y40.34] Patient Active Problem List   Diagnosis Date Noted   Acute cystitis 04/23/2022   Chronic diastolic CHF (congestive heart failure) ([place]) 04/23/2022   GAD (generalized anxiety disorder) 04/23/2022   HLD (hyperlipidemia) 04/23/2022   Polypharmacy 08/09/2020   Acute CVA (cerebrovascular accident) ([place]) 08/09/2020   Thyroid nodule 08/09/2020   CVA (cerebral vascular accident) ([place]) 08/08/2020   Bradycardia    Acute metabolic encephalopathy    Sepsis secondary to UTI ([place]) 03/20/2019   Closed displaced fracture of left femoral neck ([place]) 09/26/2018   AKI (acute kidney injury) ([place]) 06/13/2016   UTI (urinary tract  infection) 06/13/2016   Essential hypertension 06/13/2016   Urinary tract infection without hematuria    Complicated UTI (urinary tract infection) 05/03/2016   Hypercalcemia 05/03/2016   Dehydration 05/03/2016   Acute encephalopathy 05/03/2016   Cerebrovascular disease 05/03/2016    Schizoaffective disorder ([place]) 05/03/2016   Bifascicular block 05/03/2016   PCP:  [name], MD Pharmacy:  No Pharmacies Listed    Social Determinants of Health (SDOH) Interventions    Readmission Risk Interventions     No data to display

## 2022-04-25 NOTE — Consult Note (Signed)
[facility] New Face-to-Face Psychiatric Evaluation   Service Date: April 25, 2022 LOS:  LOS: 2 days    Assessment  Heidi Blevins is a 70 y.o. female admitted medically for 04/22/2022  7:46 PM for acute encephalopathy from independent living facility. She carries the psychiatric diagnoses of schizoaffective disorder, GAD and has a past medical history of  HTN, CHF, HLD. Psychiatry was consulted for high lithium level by Antonieta Pert, MD.    Her current presentation of auditory hallucinations is most consistent with schizophrenia. She meets criteria for schizophrenia based on 40+ year history of psychotic sympotms (mostly obtained from her son)  based on history and minimal threat to self and others.  Current outpatient psychotropic medications include cogentin, prolixin, lithium, latuda, sertraline, ativan, Invega and historically she has had a moderate response to these medication. She was compliant with medications prior to admission as reported by collateral and patient report. On initial examination, patient is alert and fairly oriented, although had poor memory and attention precluding a detailed evaluation; for this reason much of history from son. She appears to be at or nearingbaseline. Please see plan below for detailed recommendations.   Diagnoses:  Active Hospital problems: Principal Problem:   Acute metabolic encephalopathy Active Problems:   Hypercalcemia   Schizoaffective disorder ([place])   AKI (acute kidney injury) ([place])   Essential hypertension   Acute cystitis   Chronic diastolic CHF (congestive heart failure) ([facility])   GAD (generalized anxiety disorder)   HLD (hyperlipidemia)     Plan  ## Safety and Observation Level:  - Based on my clinical evaluation, I estimate the patient to be at low risk of self harm in the current setting - At this time, we recommend a routine level of observation. This decision is based on my review of the chart including  patient's history and current presentation, interview of the patient, mental status examination, and consideration of suicide risk including evaluating suicidal ideation, plan, intent, suicidal or self-harm behaviors, risk factors, and protective factors. This judgment is based on our ability to directly address suicide risk, implement suicide prevention strategies and develop a safety plan while the patient is in the clinical setting. Please contact our team if there is a concern that risk level has changed.   ## Medications:  -- We recommend continuing her home regiment with the following exceptions.  -Due to frequent UTI and super-therapeutic lithium levels, we recommend decreasing her Lithium to '150mg'$  PO at bedtime.  -Due to concerns with quality of life (frequently insensate for 1-2 days after invega LAI), we recommend stopping Invega sustenna  - she will continue to have therapeutic levels for at least another couple of weeks  - d/w son he will need to discuss re-introducing oral invega (if sx worsen after LAI wears off) with her outpatient provider  ## Medical Decision Making Capacity:  Not formally assessed  ## Further Work-up:  -- Unnecessary at this time   -- most recent EKG on 04/22/22 had QtC of 473 -- Pertinent labwork reviewed earlier this admission includes: Li level 1.26 (04/24/22 04:35), Creatinine 1.09 resolved to 0.92. GFR estimated 54 resolved to >60  ## Disposition:  -- She is psychiatrically stable to go to her assisted living.   ## Behavioral / Environmental:  -- No recommendations at this time.  ##Legal Status -None pending  Thank you for this consult request. Recommendations have been communicated to the primary team.  We will sign off at this time.  [name], Medical Student   NEW History  Relevant Aspects of Hospital Course:  Admitted on 04/22/2022 for UTI.  Patient Report:  Pt seen in afternoon. She is oriented to self. She has trouble  communicating her date of birth (01/08/1951). She leads the interview by stating "I suffer from schizophrenia". She thinks her treatment is going OK, but wishes he disease would go away. She has trouble remembering things, which she blames on her medication. She did not know that she was in a hospital, and thought the day of the week was Tuesday. She did not know the month. She did not know that she was in the hospital for a UTI.    She often answers questions by naming the medications she is on or talking about her psych history. She often answers the question she expects rather than the one that is asked; overall she is a fairly poor historian and it is difficult to get her to answer a direct question. She is quite perseverative and repeats "if I get real quiet the voices go away" at least 10 times during brief evaluation.  She denies thoughts of hurting herself recently (remote). She has had no thoughts of hurting other people.     Her medication is helping with the voices.    She endorses auditory hallucinations (nonspecific, yammering, can't make out words). Has occasional visual hallucinations which sound much less frequent than her auditory hallucinations, although could not answer detailed questions about this   She denies stiffness from her medications.    She denies other psychiatric diagnoses. She is not able to provide a medical history.    She attributes clear signs and symptoms of TD to "the voices"; states this has been going on since her husband went to the army (13 years ago, seems unlikely). She states the voices distress her, and therefor the TD symptoms are distressing.   Symptoms of TD noted in physical exam do not bother pt.   ROS:  Denies SI, HI AVH. She denies stiffness from her medications.   Collateral information:  Obtained from Son [facility]: [name] has had problems with her for most of Damien's life. She has been hospitalized >10 times during Damien's life (over  past 40 years). Nothing has every truly gotten her voices to go away. She does poorly on "starting and stopping" her medications, and he states being off medication is "very bad" for the patient. He is concerned that she was recently put on a shot of Invega, which has correlated with a decrease in function directly after her shot on multiple occasions. He is worried this is leading to more UTIs and affecting her quality of life.    Psychiatric History:  Information collected from Patient and chart    Social History:  Lives in an ALF  Tobacco use: Denies Alcohol use: Denies Drug use: Denies  Family History:   The patient's family history includes CVA in her mother; Hypertension in her brother and mother; Lupus in her sister; Peptic Ulcer Disease in her father.  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   Procedure: LEFT TOTAL HIP ARTHROPLASTY ANTERIOR APPROACH;  Surgeon: Leandrew Koyanagi, MD;  Location: [facility];  Service: Orthopedics;  Laterality: Left;    Medications:   Current Facility-Administered Medications:    acetaminophen (TYLENOL) tablet 650 mg, 650 mg, Oral, Q6H PRN, 650 mg at 04/24/22 0044 **OR** acetaminophen (TYLENOL) suppository 650 mg, 650 mg, Rectal, Q6H PRN, Howerter, Justin B, DO   atorvastatin (LIPITOR) tablet 40 mg, 40 mg, Oral, QHS, Howerter, Justin B, DO, 40 mg at 04/24/22 2103   benztropine (COGENTIN) tablet 1 mg, 1 mg, Oral, BID, Howerter, Justin B, DO, 1 mg at 04/25/22 1030   cefTRIAXone (ROCEPHIN) 1 g in sodium chloride 0.9 % 100  mL IVPB, 1 g, Intravenous, Q24H, Howerter, Justin B, DO, Stopped at 04/24/22 2041   clopidogrel (PLAVIX) tablet 75 mg, 75 mg, Oral, Daily, Howerter, Justin B, DO, 75 mg at 04/25/22 1040   fluPHENAZine (PROLIXIN) tablet 20 mg, 20 mg, Oral, Daily, Howerter, Justin B, DO, 20 mg at 04/25/22 1041   lactated ringers infusion, , Intravenous, Continuous, Kc, Ramesh, MD, Last Rate: 50 mL/hr at 04/24/22 0438, New Bag at 04/24/22 0438   LORazepam (ATIVAN) tablet 0.5 mg, 0.5 mg, Oral, Q8H PRN, Howerter, Justin B, DO   lurasidone (LATUDA) tablet 120 mg, 120 mg, Oral, Daily, Howerter, Justin B, DO, 120 mg at 04/25/22 1040   melatonin tablet 5 mg, 5 mg, Oral, QHS PRN, Howerter, Justin B, DO   nicotine (NICODERM CQ - dosed in mg/24 hours) patch 14 mg, 14 mg, Transdermal, Daily, Kc, Ramesh, MD, 14 mg at 04/25/22 1042   pantoprazole (PROTONIX) injection 40 mg, 40 mg, Intravenous, Q12H, Kc, Ramesh, MD, 40 mg at 04/25/22 1030   sertraline (ZOLOFT) tablet 100 mg, 100 mg, Oral, Daily, Howerter, Justin B, DO, 100 mg at 04/25/22 1029  Allergies: Allergies  Allergen Reactions   Sulfonamide Derivatives Hives            Objective  Vital signs:  Temp:  [98.3 F (36.8 C)-98.8 F (37.1 C)] 98.3 F (36.8 C) (08/29 0400) Pulse Rate:  [67-84] 67 (08/29 0800) Resp:  [11-19] 11 (08/29 0800) BP: (116-144)/(64-91) 143/79 (08/29 0800) SpO2:  [92 %-100 %] 96 % (08/29 0400)  Psychiatric Specialty Exam:  Presentation  General Appearance: Fairly Groomed Eye Contact:Fair Speech:Slow Speech Volume:Normal Handedness:No data recorded  Mood and Affect  Mood:-- ("good") Affect:Appropriate  Thought Process  Thought Processes:Coherent Descriptions of Associations:Tangential  Orientation:Partial  Thought Content:Perseveration  History of Schizophrenia/Schizoaffective disorder:No data recorded Duration of Psychotic Symptoms:No data recorded Hallucinations:Hallucinations: Auditory Description of Auditory  Hallucinations: voices talking in the background. Not specific.  Ideas of Reference:None  Suicidal Thoughts:Suicidal Thoughts: No  Homicidal Thoughts:Homicidal Thoughts: No   Sensorium  Memory:Immediate Fair; Recent Poor; Remote Poor Judgment:Impaired Insight:Fair  Executive Functions  Concentration:Fair Attention Span:Fair [place]  Psychomotor Activity  Psychomotor Activity:Psychomotor Activity: Extrapyramidal Side Effects (EPS) Extrapyramidal Side Effects (EPS): Tardive Dyskinesia AIMS Completed?: Yes  Assets  Assets:Financial Resources/Insurance; Social Support  Sleep  Sleep:No data recorded   Physical Exam: Physical Exam Musculoskeletal:     Comments: No stiffness noted  Neurological:     Mental Status: She is alert.     Comments: Repetitive movements of the face, lips, and tongue consistent with TD.  Psychiatric:        Attention and  Perception: Attention normal. She perceives auditory hallucinations.        Mood and Affect: Mood normal.        Speech: Speech is tangential.        Behavior: Behavior normal. Behavior is cooperative.        Cognition and Memory: Cognition is impaired. Memory is impaired.     Comments: Perseveration    Review of Systems  Psychiatric/Behavioral:  Positive for hallucinations (Auditory, voices talking in background, no command). Negative for depression and suicidal ideas.    Blood pressure (!) 143/79, pulse 67, temperature 98.3 F (36.8 C), temperature source Oral, resp. rate 11, height '5\' 5"'$  (1.651 m), weight 67.6 kg, SpO2 96 %. Body mass index is 24.8 kg/m.

## 2022-04-25 NOTE — Progress Notes (Signed)
Pt attempting to get up multiple times this shift for restroom. Pt bed alarm on throughout shift. Ambulates with minimal assist. No needs or concerns at this time. Pt with some complaints of mild cramping in abd, no BM thus far but has had some smearing on the pad underneath.

## 2022-04-25 NOTE — TOC Transition Note (Signed)
Transition of Care [facility]) - CM/SW Discharge Note   Patient Details  Name: Heidi Blevins MRN: [id number] Date of Birth: 1950/08/31  [facility] CM/SW Contact:  [name], LCSW Phone Number: 04/25/2022, 3:55 PM   Clinical Narrative:    Patient will DC to: [facility] Anticipated DC date: 04/25/22 Family notified: Son, [name] Transport by: [facility] (p. [phone] if needed)   Per MD patient ready for DC to ALF. RN to call report prior to discharge [phone]). RN, patient, patient's family, and facility notified of DC. Discharge Summary and FL2 sent to facility. DC packet on chart. Ambulance transport requested for patient.   CSW will sign off for now as social work intervention is no longer needed. Please consult [place] again if new needs arise.     Final next level of care: Assisted Living Barriers to Discharge: No Barriers Identified   Patient Goals and CMS Choice Patient states their goals for this hospitalization and ongoing recovery are:: Return to ALF CMS Medicare.gov Compare Post Acute Care list provided to:: Patient Represenative (must comment) Choice offered to / list presented to : Adult Children  Discharge Placement                Patient to be transferred to facility by: [facility] Name of family member notified: Son, [name] Patient and family notified of of transfer: 04/25/22  Discharge Plan and Services In-house Referral: Clinical Social Work   Post Acute Care Choice: Home Health                               Social Determinants of Health (SDOH) Interventions     Readmission Risk Interventions     No data to display

## 2022-04-25 NOTE — Plan of Care (Signed)
  Problem: Education: Goal: Knowledge of General Education information will improve Description: Including pain rating scale, medication(s)/side effects and non-pharmacologic comfort measures Outcome: Progressing   Problem: Pain Managment: Goal: General experience of comfort will improve Outcome: Progressing   

## 2023-03-27 ENCOUNTER — Emergency Department (HOSPITAL_COMMUNITY)
Admission: EM | Admit: 2023-03-27 | Discharge: 2023-03-28 | Disposition: A | Discharge status: Home / Self Care | Payer: Medicare Other | Attending: Emergency Medicine | Admitting: Emergency Medicine

## 2023-03-27 ENCOUNTER — Emergency Department (HOSPITAL_COMMUNITY): Payer: Medicare Other

## 2023-03-27 ENCOUNTER — Other Ambulatory Visit: Payer: Self-pay

## 2023-03-27 ENCOUNTER — Encounter (HOSPITAL_COMMUNITY): Payer: Self-pay | Admitting: Emergency Medicine

## 2023-03-27 DIAGNOSIS — R309 Painful micturition, unspecified: Secondary | ICD-10-CM | POA: Diagnosis present

## 2023-03-27 DIAGNOSIS — D72829 Elevated white blood cell count, unspecified: Secondary | ICD-10-CM | POA: Insufficient documentation

## 2023-03-27 DIAGNOSIS — Z7902 Long term (current) use of antithrombotics/antiplatelets: Secondary | ICD-10-CM | POA: Diagnosis not present

## 2023-03-27 DIAGNOSIS — N3000 Acute cystitis without hematuria: Secondary | ICD-10-CM | POA: Insufficient documentation

## 2023-03-27 LAB — URINALYSIS, ROUTINE W REFLEX MICROSCOPIC
Bilirubin Urine: NEGATIVE
Glucose, UA: NEGATIVE mg/dL
Ketones, ur: NEGATIVE mg/dL
Nitrite: NEGATIVE
Protein, ur: 100 mg/dL — AB
Specific Gravity, Urine: 1.008 (ref 1.005–1.030)
WBC, UA: >50 WBC/hpf (ref 0–5)
pH: 7 (ref 5.0–8.0)

## 2023-03-27 LAB — BASIC METABOLIC PANEL
Anion gap: 9 (ref 5–15)
BUN: 15 mg/dL (ref 8–23)
CO2: 19 mmol/L — ABNORMAL LOW (ref 22–32)
Calcium: 10 mg/dL (ref 8.9–10.3)
Chloride: 107 mmol/L (ref 98–111)
Creatinine, Ser: 1.01 mg/dL — ABNORMAL HIGH (ref 0.44–1.00)
GFR, Estimated: 59 mL/min — ABNORMAL LOW (ref >60)
Glucose, Bld: 100 mg/dL — ABNORMAL HIGH (ref 70–99)
Potassium: 3.7 mmol/L (ref 3.5–5.1)
Sodium: 135 mmol/L (ref 135–145)

## 2023-03-27 LAB — CBC
HCT: 35 % — ABNORMAL LOW (ref 36.0–46.0)
Hemoglobin: 10.9 g/dL — ABNORMAL LOW (ref 12.0–15.0)
MCH: 30.2 pg (ref 26.0–34.0)
MCHC: 31.1 g/dL (ref 30.0–36.0)
MCV: 97 fL (ref 80.0–100.0)
Platelets: 354 10*3/uL (ref 150–400)
RBC: 3.61 MIL/uL — ABNORMAL LOW (ref 3.87–5.11)
RDW: 12.7 % (ref 11.5–15.5)
WBC: 15.9 10*3/uL — ABNORMAL HIGH (ref 4.0–10.5)
nRBC: 0 % (ref 0.0–0.2)

## 2023-03-27 MED ORDER — IBUPROFEN 400 MG PO TABS: 400.0000 mg | ORAL_TABLET | Freq: Once | ORAL | Status: DC

## 2023-03-27 MED ORDER — SODIUM CHLORIDE 0.9 % IV SOLN
1.0000 g | Freq: Once | INTRAVENOUS | Status: AC
  Administered 2023-03-27: 1 g via INTRAVENOUS
  Filled 2023-03-27: qty 10

## 2023-03-27 MED ORDER — CEPHALEXIN 500 MG PO CAPS: 500.0000 mg | ORAL_CAPSULE | Freq: Two times a day (BID) | ORAL | 0 refills | Status: AC

## 2023-03-27 MED ORDER — SODIUM CHLORIDE 0.9 % IV BOLUS
1000.0000 mL | Freq: Once | INTRAVENOUS | Status: AC
  Administered 2023-03-27: 1000 mL via INTRAVENOUS

## 2023-03-27 NOTE — ED Notes (Signed)
[facility] called no eta 7th out. PT [facility]

## 2023-03-27 NOTE — ED Triage Notes (Signed)
Per [facility] pt coming from [facility]- family requested patient be sent out for UTI. Reports increased lethargy and loss of appetite x 3 days. Patient A&0 to her baseline.

## 2023-03-27 NOTE — Discharge Instructions (Signed)
Take next dose antibiotic tomorrow morning.  Please return if you develop fever or other worsening symptoms.

## 2023-03-27 NOTE — ED Provider Notes (Signed)
[place] EMERGENCY DEPARTMENT AT [facility] Provider Note   CSN: [id number] Arrival date & time: 03/27/23  1242     History  Chief Complaint  Patient presents with   Weakness    Heidi Blevins is a 72 y.o. female.  Patient here with pain with urination, right flank pain, decreased appetite.  Denies any fevers or chills.  She thinks she might of been on antibiotics recently for UTI.  She denies any abdominal pain otherwise.  No nausea vomiting or diarrhea.  Denies any chest pain or shortness of breath.  Denies any headache weakness numbness or chills or fever.  She lives at nursing home.  The history is provided by the patient.       Home Medications Prior to Admission medications   Medication Sig Start Date End Date Taking? Authorizing Provider  cephALEXin (KEFLEX) 500 MG capsule Take 1 capsule (500 mg total) by mouth 2 (two) times daily for 5 days. 03/27/23 04/01/23 Yes Shanara Schnieders, DO  acetaminophen (TYLENOL) 325 MG tablet Take 650 mg by mouth every 4 (four) hours as needed for mild pain, moderate pain, fever or headache.    [provider]  atorvastatin (LIPITOR) 40 MG tablet Take 1 tablet (40 mg total) by mouth daily. Patient taking differently: Take 40 mg by mouth at bedtime. 08/13/20   Rolly Salter, MD  benztropine (COGENTIN) 1 MG tablet Take 1 mg by mouth 2 (two) times daily.    [provider]  clopidogrel (PLAVIX) 75 MG tablet Take 1 tablet (75 mg total) by mouth daily. 08/13/20   Rolly Salter, MD  Cranberry 425 MG CAPS Take 425 mg by mouth 2 (two) times a day.    [provider]  ferrous fumarate-b12-vitamic C-folic acid (FEROCON) capsule Take 1 capsule by mouth at bedtime.    [provider]  fluPHENAZine (PROLIXIN) 10 MG tablet Take 20 mg by mouth daily.    [provider]  lisinopril (ZESTRIL) 20 MG tablet Take 20 mg by mouth daily. 04/18/22   [provider]  lithium carbonate 150 MG  capsule Take 1 capsule (150 mg total) by mouth at bedtime. 04/25/22 05/25/22  Lanae Boast, MD  loratadine (CLARITIN) 10 MG tablet Take 10 mg by mouth daily.    [provider]  LORazepam (ATIVAN) 0.5 MG tablet Take 0.5 mg by mouth in the morning, at noon, in the evening, and at bedtime.    [provider]  Lurasidone HCl (LATUDA) 120 MG TABS Take 120 mg by mouth daily.    [provider]  melatonin 5 MG TABS Take 5 mg by mouth at bedtime.    [provider]  Multiple Vitamins-Minerals (MULTIVITAMINS THER. W/MINERALS) TABS tablet Take 1 tablet by mouth daily.    [provider]  OVER THE COUNTER MEDICATION Take 1 Bottle by mouth 3 (three) times daily. Mighty shakes    [provider]  polyethylene glycol powder (GLYCOLAX/MIRALAX) 17 GM/SCOOP powder Take 17 g by mouth daily.    [provider]  Pramoxine HCl (VAGISIL ANTI-ITCH MEDICATED) 1 % MISC Apply vaginally once a day Apply 1 application topically daily as needed (for vaginal itching). Apply vaginally once a day Patient taking differently: Apply 1 application  topically daily as needed (for vaginal itching). 03/25/19   Rodolph Bong, MD  Propylene Glycol (SYSTANE BALANCE OP) Place 1 drop into both eyes 3 (three) times daily.    [provider]  sertraline (ZOLOFT) 100  MG tablet Take 100 mg by mouth daily.    [provider]  simethicone (MYLICON) 80 MG chewable tablet Chew 1 tablet (80 mg total) by mouth 4 (four) times daily. 08/13/20   Rolly Salter, MD      Allergies    Sulfonamide derivatives    Review of Systems   Review of Systems  Physical Exam Updated Vital Signs BP (!) 150/85   Pulse 85   Temp 97.9 F (36.6 C) (Oral)   Resp 19   Ht 5\' 5"  (1.651 m)   Wt 67.6 kg   SpO2 99%   BMI 24.79 kg/m  Physical Exam Vitals and nursing note reviewed.  Constitutional:      General: She is not in acute distress.    Appearance: She is well-developed. She  is not ill-appearing.  HENT:     Head: Normocephalic and atraumatic.     Mouth/Throat:     Mouth: Mucous membranes are moist.  Eyes:     Extraocular Movements: Extraocular movements intact.     Conjunctiva/sclera: Conjunctivae normal.     Pupils: Pupils are equal, round, and reactive to light.  Cardiovascular:     Rate and Rhythm: Normal rate and regular rhythm.     Pulses: Normal pulses.     Heart sounds: Normal heart sounds. No murmur heard. Pulmonary:     Effort: Pulmonary effort is normal. No respiratory distress.     Breath sounds: Normal breath sounds.  Abdominal:     Palpations: Abdomen is soft.     Tenderness: There is no abdominal tenderness. There is right CVA tenderness.  Musculoskeletal:        General: No swelling.     Cervical back: Normal range of motion and neck supple.  Skin:    General: Skin is warm and dry.     Capillary Refill: Capillary refill takes less than 2 seconds.  Neurological:     General: No focal deficit present.     Mental Status: She is alert and oriented to person, place, and time.     Cranial Nerves: No cranial nerve deficit.     Sensory: No sensory deficit.     Motor: No weakness.     Coordination: Coordination normal.  Psychiatric:        Mood and Affect: Mood normal.     ED Results / Procedures / Treatments   Labs (all labs ordered are listed, but only abnormal results are displayed) Labs Reviewed  BASIC METABOLIC PANEL - Abnormal; Notable for the following components:      Result Value   CO2 19 (*)    Glucose, Bld 100 (*)    Creatinine, Ser 1.01 (*)    GFR, Estimated 59 (*)    All other components within normal limits  CBC - Abnormal; Notable for the following components:   WBC 15.9 (*)    RBC 3.61 (*)    Hemoglobin 10.9 (*)    HCT 35.0 (*)    All other components within normal limits  URINALYSIS, ROUTINE W REFLEX MICROSCOPIC - Abnormal; Notable for the following components:   Color, Urine AMBER (*)    APPearance CLOUDY  (*)    Hgb urine dipstick SMALL (*)    Protein, ur 100 (*)    Leukocytes,Ua MODERATE (*)    Bacteria, UA MANY (*)    All other components within normal limits  URINE CULTURE    EKG EKG Interpretation Date/Time:  Tuesday March 27 2023 12:53:51 EDT Ventricular  Rate:  84 PR Interval:  158 QRS Duration:  136 QT Interval:  412 QTC Calculation: 486 R Axis:   -83  Text Interpretation: Normal sinus rhythm Left axis deviation Right bundle branch block Possible Lateral infarct , age undetermined Abnormal ECG When compared with ECG of 22-Apr-2022 20:00, No significant change since last tracing Confirmed by Margarita Grizzle [phone]) on 03/27/2023 12:58:07 PM  Radiology CT L-SPINE NO CHARGE  Result Date: 03/27/2023 CLINICAL DATA:  Urinary tract infection, pain, lethargy EXAM: CT LUMBAR SPINE WITHOUT CONTRAST TECHNIQUE: Multidetector CT imaging of the lumbar spine was performed without intravenous contrast administration. Multiplanar CT image reconstructions were also generated. RADIATION DOSE REDUCTION: This exam was performed according to the departmental dose-optimization program which includes automated exposure control, adjustment of the mA and/or kV according to patient size and/or use of iterative reconstruction technique. COMPARISON:  09/26/2018 FINDINGS: Segmentation: 5 lumbar type vertebrae. Alignment: Pronounced left convex lumbar scoliosis centered at the L2-3 level. Otherwise alignment is anatomic. Vertebrae: There are no acute or destructive bony abnormalities. Mild chronic anterior wedging of L2 is stable. Bony fusion across the facet joints of the visualized thoracic spine through the L3 level. 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 Paraspinal and other soft tissues: Paraspinal soft tissues are unremarkable. Incidental calcified gallstone. Aortic atherosclerosis. Disc levels: Findings at individual levels are as follows: L1-2: Unremarkable. L2-3: Unremarkable. L3-4:  Circumferential disc osteophyte complex and bilateral facet hypertrophy resulting mild central stenosis and severe right neural foraminal encroachment. L4-5: Circumferential disc bulge with bilateral facet and ligamentum flavum hypertrophy results in moderate central canal stenosis and severe bilateral symmetrical neural foraminal encroachment. L5-S1: Circumferential disc bulge and bilateral facet hypertrophy result in moderate central canal stenosis and severe right greater than left neural foraminal narrowing. Reconstructed images demonstrate no additional findings. IMPRESSION: 1. No acute displaced fracture. 2. 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 3. Severe multilevel lumbar spondylosis and facet hypertrophy from L3-4 through L5-S1. Electronically Signed   By: Sharlet Salina M.D.   On: 03/27/2023 17:04   CT Renal Stone Study  Result Date: 03/27/2023 CLINICAL DATA:  Abdominal pain.  UTI and lethargy. EXAM: CT ABDOMEN AND PELVIS WITHOUT CONTRAST TECHNIQUE: Multidetector CT imaging of the abdomen and pelvis was performed following the standard protocol without IV contrast. RADIATION DOSE REDUCTION: This exam was performed according to the departmental dose-optimization program which includes automated exposure control, adjustment of the mA and/or kV according to patient size and/or use of iterative reconstruction technique. COMPARISON:  CT 03/19/2019 FINDINGS: Lower chest: Mild linear opacity along the left lung base likely scar or atelectasis. No pleural effusion. Moderate pericardial effusion. This was seen previously. Coronary artery calcifications are seen. Hepatobiliary: Stone dependently in the mildly distended gallbladder. On this non IV contrast exam there is grossly preserved hepatic parenchyma. Pancreas: Unremarkable. No pancreatic ductal dilatation or surrounding inflammatory changes.  Spleen: Normal in size without focal abnormality. Adrenals/Urinary Tract: The adrenal glands are preserved. Mild areas of renal atrophy. No abnormal calcification seen within either kidney nor along the course of either ureter. Mildly distended urinary bladder. There is slight bladder wall thickening and adjacent stranding. Please correlate for history of UTI and cystitis. Stomach/Bowel: No oral contrast. Large bowel is of normal course and caliber. Moderate colonic stool. There is loop of transverse colon extending between the anterior liver margin in the anterior abdominal wall, a variant distribution of  bowel. Normal appendix is seen extending inferior to the cecum. The cecum is in the right midabdomen. No small bowel dilatation. Vascular/Lymphatic: Scattered vascular calcifications. Normal caliber aorta and IVC. No abnormal lymph node enlargement identified in the abdomen and pelvis. Reproductive: Uterus and bilateral adnexa are unremarkable. Other: No free air or free fluid. 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 Multifocal degenerative changes. Multilevel canal encroachment along the lumbar spine. IMPRESSION: Moderate colonic stool.  No bowel obstruction.  Normal appendix. No obstructing renal or ureteral stones. There is wall thickening of the bladder with stranding. Please correlate for cystitis. Gallstone. Stable moderate pericardial effusion Electronically Signed   By: Karen Kays M.D.   On: 03/27/2023 16:48   DG Chest 1 View  Result Date: 03/27/2023 CLINICAL DATA:  Lethargy and loss of appetite. EXAM: CHEST  1 VIEW COMPARISON:  April 22, 2022 FINDINGS: The heart size and mediastinal contours are within normal limits. Marked severity tortuosity of the descending thoracic aorta is seen. There is no evidence of acute infiltrate, pleural effusion or pneumothorax. Stable postoperative changes seen involving  the thoracic spine with marked severity dextroscoliosis. IMPRESSION: Stable exam without acute cardiopulmonary disease. Electronically Signed   By: Aram Candela M.D.   On: 03/27/2023 16:27    Procedures Procedures    Medications Ordered in ED Medications  sodium chloride 0.9 % bolus 1,000 mL (0 mLs Intravenous Stopped 03/27/23 1854)  cefTRIAXone (ROCEPHIN) 1 g in sodium chloride 0.9 % 100 mL IVPB (0 g Intravenous Stopped 03/27/23 1854)    ED Course/ Medical Decision Making/ A&P                                 Medical Decision Making Amount and/or Complexity of Data Reviewed Labs: ordered. Radiology: ordered.  Risk Prescription drug management.   Jacinto Halim is here with painful urination, right flank pain.  Normal vitals.  No fever.  Patient with history of schizoaffective disorder, heart failure, anxiety.  Differential diagnosis kidney stone versus pyelonephritis versus UTI versus less likely intra-abdominal process like a bowel obstruction.  Will get basic labs including urinalysis, CT abdomen pelvis, give IV fluids.  She has no fever.  I have less suspicion for sepsis at this time.  Urinalysis does appear to be consistent with infection.  She is given a dose of Rocephin prior to discharge.  Otherwise she had mild leukocytosis of 16 but no fever or tachycardia.  Electrolytes are unremarkable.  CT scan showed no evidence of kidney stone or pyelonephritis.  She was able to eat and drink without any issues.  Lumbar CT was unremarkable per radiology report.  Overall talked with the family member at the bedside and the patient.  They both felt comfortable with going home.  She is able to eat a hamburger while here.  Her energy levels appear to be pretty good.  They were given return precautions including fever, worsening symptoms.  Discharged with prescription for Keflex.  Urine culture has been sent.  This chart was dictated using voice recognition software.  Despite best  efforts to proofread,  errors can occur which can change the documentation meaning.         Final Clinical Impression(s) / ED Diagnoses Final diagnoses:  Acute cystitis without hematuria    Rx / DC Orders ED Discharge Orders  Ordered    cephALEXin (KEFLEX) 500 MG capsule  2 times daily        03/27/23 1920              Virgina Norfolk, DO 03/27/23 1922

## 2023-04-01 ENCOUNTER — Telehealth (HOSPITAL_BASED_OUTPATIENT_CLINIC_OR_DEPARTMENT_OTHER): Payer: Self-pay | Admitting: *Deleted

## 2023-04-01 NOTE — Telephone Encounter (Signed)
Post ED Visit - Positive Culture Follow-up  Culture report reviewed by antimicrobial stewardship pharmacist: [facility] []  Enzo Bi, Pharm.D. []  Celedonio Miyamoto, Pharm.D., BCPS AQ-ID []  Garvin Fila, Pharm.D., BCPS []  Georgina Pillion, Pharm.D., BCPS []  [place], [place].D., BCPS, AAHIVP []  Estella Husk, Pharm.D., BCPS, AAHIVP []  Lysle Pearl, PharmD, BCPS []  Phillips Climes, PharmD, BCPS []  Agapito Games, PharmD, BCPS []  Verlan Friends, PharmD []  Mervyn Gay, PharmD, BCPS [x]  Riccardo Dubin, PharmD  [facility] []  Len Childs, PharmD []  Greer Pickerel, PharmD []  Adalberto Cole, PharmD []  Perlie Gold, Rph []  Lonell Face) Jean Rosenthal, PharmD []  Earl Many, PharmD []  Junita Push, PharmD []  Dorna Leitz, PharmD []  Terrilee Files, PharmD []  Lynann Beaver, PharmD []  Keturah Barre, PharmD []  Loralee Pacas, PharmD []  Bernadene Person, PharmD   Positive urine culture Treated with Cephalexin, organism sensitive to the same and no further patient follow-up is required at this time.  Patsey Berthold 04/01/2023, 2:35 PM

## 2023-06-29 ENCOUNTER — Other Ambulatory Visit: Payer: Self-pay

## 2023-06-29 ENCOUNTER — Encounter (HOSPITAL_COMMUNITY): Payer: Self-pay

## 2023-06-29 ENCOUNTER — Emergency Department (HOSPITAL_COMMUNITY)
Admission: EM | Admit: 2023-06-29 | Discharge: 2023-06-30 | Disposition: A | Discharge status: Home / Self Care | Payer: Medicare Other | Attending: Emergency Medicine | Admitting: Emergency Medicine

## 2023-06-29 ENCOUNTER — Emergency Department (HOSPITAL_COMMUNITY): Payer: Medicare Other

## 2023-06-29 DIAGNOSIS — Y92007 Garden or yard of unspecified non-institutional (private) residence as the place of occurrence of the external cause: Secondary | ICD-10-CM | POA: Diagnosis not present

## 2023-06-29 DIAGNOSIS — R4182 Altered mental status, unspecified: Secondary | ICD-10-CM | POA: Diagnosis not present

## 2023-06-29 DIAGNOSIS — N3001 Acute cystitis with hematuria: Secondary | ICD-10-CM | POA: Insufficient documentation

## 2023-06-29 DIAGNOSIS — W19XXXA Unspecified fall, initial encounter: Secondary | ICD-10-CM | POA: Insufficient documentation

## 2023-06-29 DIAGNOSIS — Z7902 Long term (current) use of antithrombotics/antiplatelets: Secondary | ICD-10-CM | POA: Insufficient documentation

## 2023-06-29 DIAGNOSIS — R300 Dysuria: Secondary | ICD-10-CM | POA: Diagnosis present

## 2023-06-29 LAB — CBC WITH DIFFERENTIAL/PLATELET
Abs Immature Granulocytes: 0.2 10*3/uL — ABNORMAL HIGH (ref 0.00–0.07)
Basophils Absolute: 0.1 10*3/uL (ref 0.0–0.1)
Basophils Relative: 0 %
Eosinophils Absolute: 0.1 10*3/uL (ref 0.0–0.5)
Eosinophils Relative: 0 %
HCT: 33.3 % — ABNORMAL LOW (ref 36.0–46.0)
Hemoglobin: 10.4 g/dL — ABNORMAL LOW (ref 12.0–15.0)
Immature Granulocytes: 1 %
Lymphocytes Relative: 7 %
Lymphs Abs: 1.5 10*3/uL (ref 0.7–4.0)
MCH: 29.7 pg (ref 26.0–34.0)
MCHC: 31.2 g/dL (ref 30.0–36.0)
MCV: 95.1 fL (ref 80.0–100.0)
Monocytes Absolute: 1.8 10*3/uL — ABNORMAL HIGH (ref 0.1–1.0)
Monocytes Relative: 9 %
Neutro Abs: 17.4 10*3/uL — ABNORMAL HIGH (ref 1.7–7.7)
Neutrophils Relative %: 83 %
Platelets: 411 10*3/uL — ABNORMAL HIGH (ref 150–400)
RBC: 3.5 MIL/uL — ABNORMAL LOW (ref 3.87–5.11)
RDW: 13 % (ref 11.5–15.5)
WBC: 21 10*3/uL — ABNORMAL HIGH (ref 4.0–10.5)
nRBC: 0 % (ref 0.0–0.2)

## 2023-06-29 LAB — URINALYSIS, W/ REFLEX TO CULTURE (INFECTION SUSPECTED)
Bilirubin Urine: NEGATIVE
Glucose, UA: NEGATIVE mg/dL
Ketones, ur: NEGATIVE mg/dL
Nitrite: NEGATIVE
Protein, ur: 100 mg/dL — AB
Specific Gravity, Urine: 1.015 (ref 1.005–1.030)
pH: 8.5 — ABNORMAL HIGH (ref 5.0–8.0)

## 2023-06-29 LAB — COMPREHENSIVE METABOLIC PANEL
ALT: 60 U/L — ABNORMAL HIGH (ref 0–44)
AST: 40 U/L (ref 15–41)
Albumin: 2.9 g/dL — ABNORMAL LOW (ref 3.5–5.0)
Alkaline Phosphatase: 127 U/L — ABNORMAL HIGH (ref 38–126)
Anion gap: 7 (ref 5–15)
BUN: 27 mg/dL — ABNORMAL HIGH (ref 8–23)
CO2: 18 mmol/L — ABNORMAL LOW (ref 22–32)
Calcium: 10 mg/dL (ref 8.9–10.3)
Chloride: 111 mmol/L (ref 98–111)
Creatinine, Ser: 1.3 mg/dL — ABNORMAL HIGH (ref 0.44–1.00)
GFR, Estimated: 44 mL/min — ABNORMAL LOW (ref >60)
Glucose, Bld: 112 mg/dL — ABNORMAL HIGH (ref 70–99)
Potassium: 3.9 mmol/L (ref 3.5–5.1)
Sodium: 136 mmol/L (ref 135–145)
Total Bilirubin: 0.5 mg/dL (ref 0.3–1.2)
Total Protein: 7.1 g/dL (ref 6.5–8.1)

## 2023-06-29 LAB — TROPONIN I (HIGH SENSITIVITY)
Troponin I (High Sensitivity): 13 ng/L (ref <18)
Troponin I (High Sensitivity): 22 ng/L — ABNORMAL HIGH (ref <18)
Troponin I (High Sensitivity): 10 ng/L (ref <18)

## 2023-06-29 LAB — LITHIUM LEVEL: Lithium Lvl: 0.31 mmol/L — ABNORMAL LOW (ref 0.60–1.20)

## 2023-06-29 LAB — LIPASE, BLOOD: Lipase: 21 U/L (ref 11–51)

## 2023-06-29 LAB — I-STAT CG4 LACTIC ACID, ED
Lactic Acid, Venous: 1 mmol/L (ref 0.5–1.9)
Lactic Acid, Venous: 0.8 mmol/L (ref 0.5–1.9)

## 2023-06-29 MED ORDER — CEFTRIAXONE SODIUM 1 G IJ SOLR
1.0000 g | Freq: Once | INTRAMUSCULAR | Status: AC
  Administered 2023-06-29: 1 g via INTRAVENOUS
  Filled 2023-06-29: qty 10

## 2023-06-29 MED ORDER — LACTATED RINGERS IV SOLN: INTRAVENOUS | Status: AC

## 2023-06-29 MED ORDER — CEPHALEXIN 500 MG PO CAPS: 500.0000 mg | ORAL_CAPSULE | Freq: Four times a day (QID) | ORAL | 0 refills | Status: DC

## 2023-06-29 MED ORDER — LORAZEPAM 1 MG PO TABS
0.5000 mg | ORAL_TABLET | Freq: Once | ORAL | Status: AC
Start: 2023-06-29 — End: 2023-06-29
  Administered 2023-06-29: 0.5 mg via ORAL
  Filled 2023-06-29: qty 1

## 2023-06-29 MED ORDER — LITHIUM CARBONATE 150 MG PO CAPS
150.0000 mg | ORAL_CAPSULE | Freq: Once | ORAL | Status: AC
  Administered 2023-06-29: 150 mg via ORAL
  Filled 2023-06-29: qty 1

## 2023-06-29 NOTE — ED Notes (Signed)
Dr. Donnald Garre noted about 270floz in bladder.  Dr. Donnald Garre asked for in and out catheter.

## 2023-06-29 NOTE — ED Notes (Signed)
In and Out cath performed with Alexa, EMT.  Pt's sheets were changed as she had urinated on the bed.  New depend placed on the Pt.

## 2023-06-29 NOTE — Discharge Instructions (Signed)
1.  Urine shows mild signs of infection.  Patient is being started on antibiotics for UTI.  A urine culture has been done.  This should be followed up by the patient's treating physician.  Patient can start Keflex tomorrow evening.  She was treated with 1 g of Rocephin IV in the emergency department. 2.  Patient has been rehydrated.  She is alert at this time.  She has not had fever in the emergency department.  Evaluation otherwise does not show concerning findings.  Recommendations for continued close observation and close follow-up with primary provider.
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
## 2023-06-29 NOTE — ED Triage Notes (Signed)
Pt BIB EMS due to a fall. Pt is axox4 at baseline and has had increased confusion. Frequent UTI's. Pt fell today is on plavix, did not hit head.

## 2023-06-29 NOTE — ED Notes (Signed)
[facility] HAS BEEN CALLED FOR TRANSPORT BACK TO [facility]

## 2023-07-01 LAB — URINE CULTURE: Culture: >=100000 — AB

## 2023-07-02 ENCOUNTER — Telehealth (HOSPITAL_BASED_OUTPATIENT_CLINIC_OR_DEPARTMENT_OTHER): Payer: Self-pay | Admitting: *Deleted

## 2023-07-02 NOTE — Telephone Encounter (Signed)
Post ED Visit - Positive Culture Follow-up  Culture report reviewed by antimicrobial stewardship pharmacist: [facility] []  Enzo Bi, Pharm.D. []  Celedonio Miyamoto, Pharm.D., BCPS AQ-ID []  Garvin Fila, Pharm.D., BCPS []  Georgina Pillion, [address].D., BCPS []  [place]rill, [address].D., BCPS, AAHIVP []  Estella Husk, Pharm.D., BCPS, AAHIVP []  Lysle Pearl, PharmD, BCPS []  Phillips Climes, PharmD, BCPS []  Agapito Games, PharmD, BCPS []  Verlan Friends, PharmD []  Mervyn Gay, PharmD, BCPS [x]  Estill Batten, PharmD  [facility] []  Len Childs, PharmD []  Greer Pickerel, PharmD []  Adalberto Cole, PharmD []  Perlie Gold, Rph []  Lonell Face) Jean Rosenthal, PharmD []  Earl Many, PharmD []  Junita Push, PharmD []  Dorna Leitz, PharmD []  Terrilee Files, PharmD []  Lynann Beaver, PharmD []  Keturah Barre, PharmD []  Loralee Pacas, PharmD []  Bernadene Person, PharmD   Positive urine culture Treated with Cephalexin, organism sensitive to the same and no further patient follow-up is required at this time.  Patsey Berthold 07/02/2023, 10:33 AM

## 2023-09-22 ENCOUNTER — Emergency Department (HOSPITAL_COMMUNITY): Payer: 59

## 2023-09-22 ENCOUNTER — Other Ambulatory Visit: Payer: Self-pay

## 2023-09-22 ENCOUNTER — Inpatient Hospital Stay (HOSPITAL_COMMUNITY)
Admission: EM | Admit: 2023-09-22 | Discharge: 2023-09-24 | DRG: 698 | Disposition: A | Discharge status: Home / Self Care | Payer: 59 | Source: Skilled Nursing Facility | Attending: Internal Medicine | Admitting: Internal Medicine

## 2023-09-22 DIAGNOSIS — Z882 Allergy status to sulfonamides status: Secondary | ICD-10-CM

## 2023-09-22 DIAGNOSIS — F411 Generalized anxiety disorder: Secondary | ICD-10-CM | POA: Diagnosis present

## 2023-09-22 DIAGNOSIS — G9341 Metabolic encephalopathy: Secondary | ICD-10-CM | POA: Diagnosis present

## 2023-09-22 DIAGNOSIS — N3000 Acute cystitis without hematuria: Secondary | ICD-10-CM | POA: Diagnosis present

## 2023-09-22 DIAGNOSIS — E785 Hyperlipidemia, unspecified: Secondary | ICD-10-CM | POA: Diagnosis present

## 2023-09-22 DIAGNOSIS — G4700 Insomnia, unspecified: Secondary | ICD-10-CM | POA: Diagnosis present

## 2023-09-22 DIAGNOSIS — N3001 Acute cystitis with hematuria: Secondary | ICD-10-CM

## 2023-09-22 DIAGNOSIS — I5032 Chronic diastolic (congestive) heart failure: Secondary | ICD-10-CM | POA: Diagnosis present

## 2023-09-22 DIAGNOSIS — Z8269 Family history of other diseases of the musculoskeletal system and connective tissue: Secondary | ICD-10-CM

## 2023-09-22 DIAGNOSIS — Z1152 Encounter for screening for COVID-19: Secondary | ICD-10-CM

## 2023-09-22 DIAGNOSIS — N289 Disorder of kidney and ureter, unspecified: Secondary | ICD-10-CM

## 2023-09-22 DIAGNOSIS — Z8673 Personal history of transient ischemic attack (TIA), and cerebral infarction without residual deficits: Secondary | ICD-10-CM

## 2023-09-22 DIAGNOSIS — A4151 Sepsis due to Escherichia coli [E. coli]: Secondary | ICD-10-CM | POA: Diagnosis present

## 2023-09-22 DIAGNOSIS — Z79899 Other long term (current) drug therapy: Secondary | ICD-10-CM

## 2023-09-22 DIAGNOSIS — Z8249 Family history of ischemic heart disease and other diseases of the circulatory system: Secondary | ICD-10-CM

## 2023-09-22 DIAGNOSIS — R6520 Severe sepsis without septic shock: Secondary | ICD-10-CM | POA: Diagnosis present

## 2023-09-22 DIAGNOSIS — M549 Dorsalgia, unspecified: Secondary | ICD-10-CM | POA: Diagnosis present

## 2023-09-22 DIAGNOSIS — G9340 Encephalopathy, unspecified: Secondary | ICD-10-CM | POA: Diagnosis present

## 2023-09-22 DIAGNOSIS — N1831 Chronic kidney disease, stage 3a: Secondary | ICD-10-CM | POA: Diagnosis present

## 2023-09-22 DIAGNOSIS — R4182 Altered mental status, unspecified: Principal | ICD-10-CM

## 2023-09-22 DIAGNOSIS — F32A Depression, unspecified: Secondary | ICD-10-CM | POA: Diagnosis present

## 2023-09-22 DIAGNOSIS — Z96642 Presence of left artificial hip joint: Secondary | ICD-10-CM | POA: Diagnosis present

## 2023-09-22 DIAGNOSIS — T83511A Infection and inflammatory reaction due to indwelling urethral catheter, initial encounter: Secondary | ICD-10-CM | POA: Diagnosis not present

## 2023-09-22 DIAGNOSIS — F17210 Nicotine dependence, cigarettes, uncomplicated: Secondary | ICD-10-CM | POA: Diagnosis present

## 2023-09-22 DIAGNOSIS — M419 Scoliosis, unspecified: Secondary | ICD-10-CM | POA: Diagnosis present

## 2023-09-22 DIAGNOSIS — I10 Essential (primary) hypertension: Secondary | ICD-10-CM | POA: Diagnosis present

## 2023-09-22 DIAGNOSIS — Z823 Family history of stroke: Secondary | ICD-10-CM

## 2023-09-22 DIAGNOSIS — Y846 Urinary catheterization as the cause of abnormal reaction of the patient, or of later complication, without mention of misadventure at the time of the procedure: Secondary | ICD-10-CM | POA: Diagnosis present

## 2023-09-22 DIAGNOSIS — I130 Hypertensive heart and chronic kidney disease with heart failure and stage 1 through stage 4 chronic kidney disease, or unspecified chronic kidney disease: Secondary | ICD-10-CM | POA: Diagnosis present

## 2023-09-22 DIAGNOSIS — F259 Schizoaffective disorder, unspecified: Secondary | ICD-10-CM | POA: Diagnosis present

## 2023-09-22 DIAGNOSIS — Z7902 Long term (current) use of antithrombotics/antiplatelets: Secondary | ICD-10-CM

## 2023-09-22 DIAGNOSIS — I3139 Other pericardial effusion (noninflammatory): Secondary | ICD-10-CM | POA: Diagnosis present

## 2023-09-22 LAB — URINALYSIS, W/ REFLEX TO CULTURE (INFECTION SUSPECTED)
Bilirubin Urine: NEGATIVE
Glucose, UA: NEGATIVE mg/dL
Ketones, ur: NEGATIVE mg/dL
Nitrite: NEGATIVE
Protein, ur: >=300 mg/dL — AB
Specific Gravity, Urine: 1.016 (ref 1.005–1.030)
WBC, UA: >50 WBC/hpf (ref 0–5)
pH: 7 (ref 5.0–8.0)

## 2023-09-22 LAB — LITHIUM LEVEL: Lithium Lvl: 0.2 mmol/L — ABNORMAL LOW (ref 0.60–1.20)

## 2023-09-22 LAB — I-STAT CG4 LACTIC ACID, ED
Lactic Acid, Venous: 0.4 mmol/L — ABNORMAL LOW (ref 0.5–1.9)
Lactic Acid, Venous: 0.4 mmol/L — ABNORMAL LOW (ref 0.5–1.9)

## 2023-09-22 LAB — COMPREHENSIVE METABOLIC PANEL
ALT: 37 U/L (ref 0–44)
AST: 38 U/L (ref 15–41)
Albumin: 3.8 g/dL (ref 3.5–5.0)
Alkaline Phosphatase: 115 U/L (ref 38–126)
Anion gap: 9 (ref 5–15)
BUN: 21 mg/dL (ref 8–23)
CO2: 20 mmol/L — ABNORMAL LOW (ref 22–32)
Calcium: 11 mg/dL — ABNORMAL HIGH (ref 8.9–10.3)
Chloride: 108 mmol/L (ref 98–111)
Creatinine, Ser: 1.05 mg/dL — ABNORMAL HIGH (ref 0.44–1.00)
GFR, Estimated: 56 mL/min — ABNORMAL LOW (ref >60)
Glucose, Bld: 104 mg/dL — ABNORMAL HIGH (ref 70–99)
Potassium: 3.5 mmol/L (ref 3.5–5.1)
Sodium: 137 mmol/L (ref 135–145)
Total Bilirubin: 0.7 mg/dL (ref 0.0–1.2)
Total Protein: 8.3 g/dL — ABNORMAL HIGH (ref 6.5–8.1)

## 2023-09-22 LAB — CBC WITH DIFFERENTIAL/PLATELET
Abs Immature Granulocytes: 0.08 10*3/uL — ABNORMAL HIGH (ref 0.00–0.07)
Basophils Absolute: 0.1 10*3/uL (ref 0.0–0.1)
Basophils Relative: 0 %
Eosinophils Absolute: 0.1 10*3/uL (ref 0.0–0.5)
Eosinophils Relative: 1 %
HCT: 37.9 % (ref 36.0–46.0)
Hemoglobin: 12.1 g/dL (ref 12.0–15.0)
Immature Granulocytes: 1 %
Lymphocytes Relative: 13 %
Lymphs Abs: 2.1 10*3/uL (ref 0.7–4.0)
MCH: 30.7 pg (ref 26.0–34.0)
MCHC: 31.9 g/dL (ref 30.0–36.0)
MCV: 96.2 fL (ref 80.0–100.0)
Monocytes Absolute: 1.2 10*3/uL — ABNORMAL HIGH (ref 0.1–1.0)
Monocytes Relative: 8 %
Neutro Abs: 12.5 10*3/uL — ABNORMAL HIGH (ref 1.7–7.7)
Neutrophils Relative %: 77 %
Platelets: 297 10*3/uL (ref 150–400)
RBC: 3.94 MIL/uL (ref 3.87–5.11)
RDW: 13.2 % (ref 11.5–15.5)
WBC: 16.2 10*3/uL — ABNORMAL HIGH (ref 4.0–10.5)
nRBC: 0 % (ref 0.0–0.2)

## 2023-09-22 LAB — RESP PANEL BY RT-PCR (RSV, FLU A&B, COVID)  RVPGX2
Influenza A by PCR: NEGATIVE
Influenza B by PCR: NEGATIVE
Resp Syncytial Virus by PCR: NEGATIVE
SARS Coronavirus 2 by RT PCR: NEGATIVE

## 2023-09-22 LAB — CBG MONITORING, ED: Glucose-Capillary: 100 mg/dL — ABNORMAL HIGH (ref 70–99)

## 2023-09-22 MED ORDER — SODIUM CHLORIDE 0.9 % IV SOLN
2.0000 g | Freq: Once | INTRAVENOUS | Status: AC
  Administered 2023-09-23: 2 g via INTRAVENOUS
  Filled 2023-09-22: qty 20

## 2023-09-22 MED ORDER — IOHEXOL 300 MG/ML  SOLN
75.0000 mL | Freq: Once | INTRAMUSCULAR | Status: AC | PRN
  Administered 2023-09-22: 75 mL via INTRAVENOUS

## 2023-09-22 MED ORDER — FENTANYL CITRATE PF 50 MCG/ML IJ SOSY
50.0000 ug | PREFILLED_SYRINGE | Freq: Once | INTRAMUSCULAR | Status: AC
  Administered 2023-09-22: 50 ug via INTRAVENOUS
  Filled 2023-09-22: qty 1

## 2023-09-22 NOTE — ED Notes (Signed)
Lab called- says there was not enough urine in the container. Will attempt again after pt drinks

## 2023-09-22 NOTE — ED Triage Notes (Signed)
BIBA from [facility] for altered mental status. Pt son came to visit her today and said she didn't recognize him. Per facility, pt is at baseline, and does get recurrent UTIs. 173/91 BP 68 HR 97% room air 16 RR 177 cbg

## 2023-09-22 NOTE — ED Notes (Signed)
Changed pt brief, incontinent of urine. In and out cath done for urine specimen and sent to lab

## 2023-09-22 NOTE — ED Provider Notes (Signed)
[place] EMERGENCY DEPARTMENT AT [facility] Provider Note   CSN: [id number] Arrival date & time: 09/22/23  1807     History {Add pertinent medical, surgical, social history, OB history to HPI:1} Chief Complaint  Patient presents with   Altered Mental Status    KATELYND BLAUVELT is a 73 y.o. female.  HPI     73 year old female with a history of chronic diastolic heart failure, schizoaffective disorder, prior admissions with acute encephalopathy    Past Medical History:  Diagnosis Date   Anxiety    Arrhythmia    Bifascicular block    Chronic diastolic heart failure (HCC)    Depression    Hypertension    Insomnia    Schizoaffective disorder    Scoliosis    Thyroid nodule 08/09/2020   Vitamin D deficiency     Home Medications Prior to Admission medications   Medication Sig Start Date End Date Taking? Authorizing Provider  acetaminophen (TYLENOL) 500 MG tablet Take 1,000 mg by mouth in the morning and at bedtime.    [provider]  atorvastatin (LIPITOR) 40 MG tablet Take 1 tablet (40 mg total) by mouth daily. Patient taking differently: Take 40 mg by mouth at bedtime. 08/13/20   Rolly Salter, MD  benztropine (COGENTIN) 1 MG tablet Take 1 mg by mouth 2 (two) times daily.    [provider]  cephALEXin (KEFLEX) 500 MG capsule Take 1 capsule (500 mg total) by mouth 4 (four) times daily. 06/29/23   Arby Barrette, MD  clopidogrel (PLAVIX) 75 MG tablet Take 1 tablet (75 mg total) by mouth daily. 08/13/20   Rolly Salter, MD  Cranberry 425 MG CAPS Take 425 mg by mouth 2 (two) times a day.    [provider]  ferrous sulfate (FEROSUL) 325 (65 FE) MG tablet Take 325 mg by mouth daily with breakfast.    [provider]  fluPHENAZine (PROLIXIN) 10 MG tablet Take 20 mg by mouth in the morning and at bedtime.    [provider]  hydrocortisone 2.5 % ointment Apply 1 Application topically daily as needed (dry, itchy  skin).    [provider]  ibuprofen (ADVIL) 600 MG tablet Take 600 mg by mouth 2 (two) times daily as needed. 06/22/23   [provider]  lisinopril (ZESTRIL) 20 MG tablet Take 20 mg by mouth daily. 04/18/22   [provider]  lithium carbonate 150 MG capsule Take 1 capsule (150 mg total) by mouth at bedtime. 04/25/22 06/29/23  Lanae Boast, MD  loratadine (CLARITIN) 10 MG tablet Take 10 mg by mouth daily.    [provider]  LORazepam (ATIVAN) 0.5 MG tablet Take 0.5 mg by mouth in the morning, at noon, in the evening, and at bedtime.    [provider]  Lurasidone HCl (LATUDA) 120 MG TABS Take 120 mg by mouth daily.    [provider]  melatonin 5 MG TABS Take 5 mg by mouth at bedtime.    [provider]  metoprolol tartrate (LOPRESSOR) 25 MG tablet Take 25 mg by mouth 2 (two) times daily. 06/13/23   [provider]  Multiple Vitamins-Minerals (MULTIVITAMINS THER. W/MINERALS) TABS tablet Take 1 tablet by mouth daily.    [provider]  polyethylene glycol powder (GLYCOLAX/MIRALAX) 17 GM/SCOOP powder Take 17 g by mouth daily.    [provider]  Pramoxine HCl (VAGISIL ANTI-ITCH MEDICATED) 1 % MISC Apply vaginally once a day Apply 1 application topically  daily as needed (for vaginal itching). Apply vaginally once a day Patient taking differently: Apply 1 application  topically daily as needed (for vaginal itching). 03/25/19   Rodolph Bong, MD  Propylene Glycol (SYSTANE BALANCE OP) Place 1 drop into both eyes 3 (three) times daily.    [provider]  sertraline (ZOLOFT) 100 MG tablet Take 150 mg by mouth daily.    [provider]  simethicone (MYLICON) 80 MG chewable tablet Chew 1 tablet (80 mg total) by mouth 4 (four) times daily. 08/13/20   Rolly Salter, MD      Allergies    Sulfonamide derivatives    Review of Systems   Review of Systems  Physical Exam Updated Vital Signs There  were no vitals taken for this visit. Physical Exam  ED Results / Procedures / Treatments   Labs (all labs ordered are listed, but only abnormal results are displayed) Labs Reviewed - No data to display  EKG None  Radiology No results found.  Procedures Procedures  {Document cardiac monitor, telemetry assessment procedure when appropriate:1}  Medications Ordered in ED Medications - No data to display  ED Course/ Medical Decision Making/ A&P   {   Click here for ABCD2, HEART and other calculatorsREFRESH Note before signing :1}                              Medical Decision Making  ***  {Document critical care time when appropriate:1} {Document review of labs and clinical decision tools ie heart score, Chads2Vasc2 etc:1}  {Document your independent review of radiology images, and any outside records:1} {Document your discussion with family members, caretakers, and with consultants:1} {Document social determinants of health affecting pt's care:1} {Document your decision making why or why not admission, treatments were needed:1} Final Clinical Impression(s) / ED Diagnoses Final diagnoses:  None    Rx / DC Orders ED Discharge Orders     None

## 2023-09-23 ENCOUNTER — Observation Stay (HOSPITAL_COMMUNITY): Payer: 59

## 2023-09-23 ENCOUNTER — Encounter (HOSPITAL_COMMUNITY): Payer: Self-pay | Admitting: Family Medicine

## 2023-09-23 DIAGNOSIS — Y846 Urinary catheterization as the cause of abnormal reaction of the patient, or of later complication, without mention of misadventure at the time of the procedure: Secondary | ICD-10-CM | POA: Diagnosis present

## 2023-09-23 DIAGNOSIS — A419 Sepsis, unspecified organism: Secondary | ICD-10-CM

## 2023-09-23 DIAGNOSIS — N390 Urinary tract infection, site not specified: Secondary | ICD-10-CM

## 2023-09-23 DIAGNOSIS — I3139 Other pericardial effusion (noninflammatory): Secondary | ICD-10-CM | POA: Diagnosis present

## 2023-09-23 DIAGNOSIS — I130 Hypertensive heart and chronic kidney disease with heart failure and stage 1 through stage 4 chronic kidney disease, or unspecified chronic kidney disease: Secondary | ICD-10-CM | POA: Diagnosis present

## 2023-09-23 DIAGNOSIS — T83511A Infection and inflammatory reaction due to indwelling urethral catheter, initial encounter: Secondary | ICD-10-CM | POA: Diagnosis present

## 2023-09-23 DIAGNOSIS — F411 Generalized anxiety disorder: Secondary | ICD-10-CM | POA: Diagnosis present

## 2023-09-23 DIAGNOSIS — N1831 Chronic kidney disease, stage 3a: Secondary | ICD-10-CM | POA: Diagnosis present

## 2023-09-23 DIAGNOSIS — R6520 Severe sepsis without septic shock: Secondary | ICD-10-CM | POA: Diagnosis present

## 2023-09-23 DIAGNOSIS — Z8269 Family history of other diseases of the musculoskeletal system and connective tissue: Secondary | ICD-10-CM | POA: Diagnosis not present

## 2023-09-23 DIAGNOSIS — I5032 Chronic diastolic (congestive) heart failure: Secondary | ICD-10-CM

## 2023-09-23 DIAGNOSIS — N289 Disorder of kidney and ureter, unspecified: Secondary | ICD-10-CM

## 2023-09-23 DIAGNOSIS — Z8673 Personal history of transient ischemic attack (TIA), and cerebral infarction without residual deficits: Secondary | ICD-10-CM | POA: Diagnosis not present

## 2023-09-23 DIAGNOSIS — I10 Essential (primary) hypertension: Secondary | ICD-10-CM

## 2023-09-23 DIAGNOSIS — E785 Hyperlipidemia, unspecified: Secondary | ICD-10-CM | POA: Diagnosis present

## 2023-09-23 DIAGNOSIS — F259 Schizoaffective disorder, unspecified: Secondary | ICD-10-CM | POA: Diagnosis present

## 2023-09-23 DIAGNOSIS — G9341 Metabolic encephalopathy: Secondary | ICD-10-CM | POA: Diagnosis present

## 2023-09-23 DIAGNOSIS — Z823 Family history of stroke: Secondary | ICD-10-CM | POA: Diagnosis not present

## 2023-09-23 DIAGNOSIS — Z8249 Family history of ischemic heart disease and other diseases of the circulatory system: Secondary | ICD-10-CM | POA: Diagnosis not present

## 2023-09-23 DIAGNOSIS — Z7902 Long term (current) use of antithrombotics/antiplatelets: Secondary | ICD-10-CM | POA: Diagnosis not present

## 2023-09-23 DIAGNOSIS — Z79899 Other long term (current) drug therapy: Secondary | ICD-10-CM | POA: Diagnosis not present

## 2023-09-23 DIAGNOSIS — Z1152 Encounter for screening for COVID-19: Secondary | ICD-10-CM | POA: Diagnosis not present

## 2023-09-23 DIAGNOSIS — A4151 Sepsis due to Escherichia coli [E. coli]: Secondary | ICD-10-CM | POA: Diagnosis present

## 2023-09-23 DIAGNOSIS — M419 Scoliosis, unspecified: Secondary | ICD-10-CM | POA: Diagnosis present

## 2023-09-23 DIAGNOSIS — G9340 Encephalopathy, unspecified: Secondary | ICD-10-CM | POA: Diagnosis not present

## 2023-09-23 DIAGNOSIS — Z96642 Presence of left artificial hip joint: Secondary | ICD-10-CM | POA: Diagnosis present

## 2023-09-23 DIAGNOSIS — Z882 Allergy status to sulfonamides status: Secondary | ICD-10-CM | POA: Diagnosis not present

## 2023-09-23 DIAGNOSIS — N3000 Acute cystitis without hematuria: Secondary | ICD-10-CM | POA: Diagnosis present

## 2023-09-23 DIAGNOSIS — F17210 Nicotine dependence, cigarettes, uncomplicated: Secondary | ICD-10-CM | POA: Diagnosis present

## 2023-09-23 DIAGNOSIS — F32A Depression, unspecified: Secondary | ICD-10-CM | POA: Diagnosis present

## 2023-09-23 LAB — CBC
HCT: 39.2 % (ref 36.0–46.0)
Hemoglobin: 12 g/dL (ref 12.0–15.0)
MCH: 30.5 pg (ref 26.0–34.0)
MCHC: 30.6 g/dL (ref 30.0–36.0)
MCV: 99.7 fL (ref 80.0–100.0)
Platelets: 287 10*3/uL (ref 150–400)
RBC: 3.93 MIL/uL (ref 3.87–5.11)
RDW: 13.2 % (ref 11.5–15.5)
WBC: 12.9 10*3/uL — ABNORMAL HIGH (ref 4.0–10.5)
nRBC: 0 % (ref 0.0–0.2)

## 2023-09-23 LAB — BASIC METABOLIC PANEL
Anion gap: 13 (ref 5–15)
BUN: 19 mg/dL (ref 8–23)
CO2: 16 mmol/L — ABNORMAL LOW (ref 22–32)
Calcium: 10.2 mg/dL (ref 8.9–10.3)
Chloride: 108 mmol/L (ref 98–111)
Creatinine, Ser: 0.86 mg/dL (ref 0.44–1.00)
GFR, Estimated: >60 mL/min (ref >60)
Glucose, Bld: 95 mg/dL (ref 70–99)
Potassium: 3.8 mmol/L (ref 3.5–5.1)
Sodium: 137 mmol/L (ref 135–145)

## 2023-09-23 MED ORDER — LORAZEPAM 0.5 MG PO TABS
0.5000 mg | ORAL_TABLET | Freq: Four times a day (QID) | ORAL | Status: DC
  Administered 2023-09-23 – 2023-09-24 (×6): 0.5 mg via ORAL
  Filled 2023-09-23 (×6): qty 1

## 2023-09-23 MED ORDER — SODIUM CHLORIDE 0.9 % IV SOLN
1.0000 g | INTRAVENOUS | Status: DC
  Administered 2023-09-24: 1 g via INTRAVENOUS
  Filled 2023-09-23: qty 10

## 2023-09-23 MED ORDER — BENZTROPINE MESYLATE 0.5 MG PO TABS
1.0000 mg | ORAL_TABLET | Freq: Two times a day (BID) | ORAL | Status: DC
  Administered 2023-09-23 – 2023-09-24 (×4): 1 mg via ORAL
  Filled 2023-09-23 (×4): qty 2

## 2023-09-23 MED ORDER — ENOXAPARIN SODIUM 40 MG/0.4ML IJ SOSY
40.0000 mg | PREFILLED_SYRINGE | INTRAMUSCULAR | Status: DC
  Administered 2023-09-23 – 2023-09-24 (×2): 40 mg via SUBCUTANEOUS
  Filled 2023-09-23 (×2): qty 0.4

## 2023-09-23 MED ORDER — ACETAMINOPHEN 325 MG PO TABS
650.0000 mg | ORAL_TABLET | Freq: Four times a day (QID) | ORAL | Status: DC | PRN
  Administered 2023-09-23 – 2023-09-24 (×2): 650 mg via ORAL
  Filled 2023-09-23 (×2): qty 2

## 2023-09-23 MED ORDER — OXYCODONE HCL 5 MG PO TABS: 5.0000 mg | ORAL_TABLET | ORAL | Status: DC | PRN

## 2023-09-23 MED ORDER — POLYETHYLENE GLYCOL 3350 17 G PO PACK: 17.0000 g | PACK | Freq: Every day | ORAL | Status: DC | PRN

## 2023-09-23 MED ORDER — LURASIDONE HCL 40 MG PO TABS
120.0000 mg | ORAL_TABLET | Freq: Every day | ORAL | Status: DC
  Administered 2023-09-23 – 2023-09-24 (×2): 120 mg via ORAL
  Filled 2023-09-23 (×3): qty 3

## 2023-09-23 MED ORDER — ACETAMINOPHEN 650 MG RE SUPP: 650.0000 mg | Freq: Four times a day (QID) | RECTAL | Status: DC | PRN

## 2023-09-23 MED ORDER — ATORVASTATIN CALCIUM 20 MG PO TABS
40.0000 mg | ORAL_TABLET | Freq: Every day | ORAL | Status: DC
  Administered 2023-09-23: 40 mg via ORAL
  Filled 2023-09-23: qty 2

## 2023-09-23 MED ORDER — LITHIUM CARBONATE 150 MG PO CAPS
150.0000 mg | ORAL_CAPSULE | Freq: Every day | ORAL | Status: DC
  Administered 2023-09-23: 150 mg via ORAL
  Filled 2023-09-23: qty 1

## 2023-09-23 MED ORDER — MELATONIN 5 MG PO TABS
5.0000 mg | ORAL_TABLET | Freq: Every day | ORAL | Status: DC
  Administered 2023-09-23: 5 mg via ORAL
  Filled 2023-09-23: qty 1

## 2023-09-23 MED ORDER — LISINOPRIL 20 MG PO TABS
20.0000 mg | ORAL_TABLET | Freq: Every day | ORAL | Status: DC
  Administered 2023-09-23 – 2023-09-24 (×2): 20 mg via ORAL
  Filled 2023-09-23: qty 2
  Filled 2023-09-23: qty 1

## 2023-09-23 MED ORDER — PROCHLORPERAZINE EDISYLATE 10 MG/2ML IJ SOLN: 5.0000 mg | Freq: Four times a day (QID) | INTRAMUSCULAR | Status: DC | PRN

## 2023-09-23 MED ORDER — NICOTINE 21 MG/24HR TD PT24
21.0000 mg | MEDICATED_PATCH | Freq: Every day | TRANSDERMAL | Status: DC
  Administered 2023-09-23 – 2023-09-24 (×2): 21 mg via TRANSDERMAL
  Filled 2023-09-23 (×2): qty 1

## 2023-09-23 MED ORDER — METOPROLOL TARTRATE 25 MG PO TABS
25.0000 mg | ORAL_TABLET | Freq: Two times a day (BID) | ORAL | Status: DC
Start: 2023-09-23 — End: 2023-09-24
  Administered 2023-09-23 – 2023-09-24 (×4): 25 mg via ORAL
  Filled 2023-09-23 (×4): qty 1

## 2023-09-23 MED ORDER — SERTRALINE HCL 50 MG PO TABS
150.0000 mg | ORAL_TABLET | Freq: Every day | ORAL | Status: DC
  Administered 2023-09-23 – 2023-09-24 (×2): 150 mg via ORAL
  Filled 2023-09-23: qty 3
  Filled 2023-09-23: qty 1

## 2023-09-23 MED ORDER — FLUPHENAZINE HCL 5 MG PO TABS
20.0000 mg | ORAL_TABLET | Freq: Two times a day (BID) | ORAL | Status: DC
  Administered 2023-09-23 – 2023-09-24 (×4): 20 mg via ORAL
  Filled 2023-09-23 (×4): qty 4

## 2023-09-23 MED ORDER — CLOPIDOGREL BISULFATE 75 MG PO TABS
75.0000 mg | ORAL_TABLET | Freq: Every day | ORAL | Status: DC
  Administered 2023-09-23 – 2023-09-24 (×2): 75 mg via ORAL
  Filled 2023-09-23 (×2): qty 1

## 2023-09-23 NOTE — Progress Notes (Signed)
PROGRESS NOTE    Heidi Blevins  ZOX:[id number] DOB: 09/29/1950 DOA: 09/22/2023 PCP: Oneita Hurt, No   Brief Narrative:  Heidi Blevins is a 73 y.o. female with medical history significant for hypertension, history of CVA, schizoaffective disorder, depression, anxiety, insomnia, and CKD 3A who presents with somnolence and confusion - found to have UTI - noted recently had foley catheter placed (removed 2 weeks prior to admit per family).  Assessment & Plan:   Principal Problem:   Acute cystitis Active Problems:   History of CVA (cerebrovascular accident)   Acute encephalopathy   Schizoaffective disorder (HCC)   Essential hypertension   Chronic diastolic CHF (congestive heart failure) (HCC)   GAD (generalized anxiety disorder)   HLD (hyperlipidemia)   CKD stage 3a, GFR 45-59 ml/min (HCC)   Pericardial effusion   Kidney lesion, native, left   Severe sepsis secondary to UTI  -Sepsis criteria met in the setting of tachypnea, leukocytosis, and metabolic encephalopathy. -Continue ceftriaxone, follow cultures, de-escalate as appropriate  Acute metabolic encephalopathy  -Likely secondary to above, CT head negative -Improving somewhat with treatment overnight, still not yet back to baseline  Schizoaffective disorder, depression, anxiety   - Continue home medications including fluphenazine, lithium, Latuda, Zoloft, Ativan, Cogentin    Hx of CVA  -Continue Lipitor, Plavix     HTN -Continue lisinopril, metoprolol    CKD 3A -At baseline, follow repeat labs Pericardial effusion - Noted on CT in ED, no clinical tamponade, will check echo  Left renal lesion - Questionable left renal lesion noted on CT in ED, dedicated renal CT pending  DVT prophylaxis: enoxaparin (LOVENOX) injection 40 mg Start: 09/23/23 1000   Code Status:   Code Status: Full Code  Family Communication: None present  Status is: Inpatient  Dispo: The patient is from: Home              Anticipated d/c is to:  To be determined              Anticipated d/c date is: 24 to 48 hours              Patient currently not medically stable for discharge  Consultants:  None  Procedures:  None  Antimicrobials:  Ceftriaxone  Subjective: No acute issues or events overnight denies nausea vomiting diarrhea constipation headache fevers chills chest pain shortness of breath.  Patient does admit to urinary frequency and urgency  Objective: Vitals:   09/22/23 2253 09/23/23 0234 09/23/23 0400 09/23/23 0530  BP:  (!) 159/106 (!) 173/100 (!) 159/101  Pulse:  86 68 69  Resp:  19 19 18   Temp: 98.5 F (36.9 C) 99.2 F (37.3 C) 99.1 F (37.3 C)   TempSrc:  Axillary Oral   SpO2:  99% 100% 98%    Intake/Output Summary (Last 24 hours) at 09/23/2023 8119 Last data filed at 09/23/2023 1478 Gross per 24 hour  Intake 100 ml  Output --  Net 100 ml   There were no vitals filed for this visit.  Examination:  General:  Pleasantly resting in bed, No acute distress.  Alert to person only HEENT:  Normocephalic atraumatic.  Sclerae nonicteric, noninjected.  Extraocular movements intact bilaterally. Neck:  Without mass or deformity.  Trachea is midline. Lungs:  Clear to auscultate bilaterally without rhonchi, wheeze, or rales. Heart:  Regular rate and rhythm.  Without murmurs, rubs, or gallops. Abdomen:  Soft, nontender, nondistended.  Without guarding or rebound. Extremities: Without cyanosis, clubbing, edema, or obvious deformity. Skin:  Warm and dry, no erythema.   Data Reviewed: I have personally reviewed following labs and imaging studies  CBC: Recent Labs  Lab 09/22/23 1824  WBC 16.2*  NEUTROABS 12.5*  HGB 12.1  HCT 37.9  MCV 96.2  PLT 297   Basic Metabolic Panel: Recent Labs  Lab 09/22/23 1824  NA 137  K 3.5  CL 108  CO2 20*  GLUCOSE 104*  BUN 21  CREATININE 1.05*  CALCIUM 11.0*   GFR: CrCl cannot be calculated (Unknown ideal weight.). Liver Function Tests: Recent Labs  Lab  09/22/23 1824  AST 38  ALT 37  ALKPHOS 115  BILITOT 0.7  PROT 8.3*  ALBUMIN 3.8   No results for input(s): "LIPASE", "AMYLASE" in the last 168 hours. No results for input(s): "AMMONIA" in the last 168 hours. Coagulation Profile: No results for input(s): "INR", "PROTIME" in the last 168 hours. Cardiac Enzymes: No results for input(s): "CKTOTAL", "CKMB", "CKMBINDEX", "TROPONINI" in the last 168 hours. BNP (last 3 results) No results for input(s): "PROBNP" in the last 8760 hours. HbA1C: No results for input(s): "HGBA1C" in the last 72 hours. CBG: Recent Labs  Lab 09/22/23 1949  GLUCAP 100*   Lipid Profile: No results for input(s): "CHOL", "HDL", "LDLCALC", "TRIG", "CHOLHDL", "LDLDIRECT" in the last 72 hours. Thyroid Function Tests: No results for input(s): "TSH", "T4TOTAL", "FREET4", "T3FREE", "THYROIDAB" in the last 72 hours. Anemia Panel: No results for input(s): "VITAMINB12", "FOLATE", "FERRITIN", "TIBC", "IRON", "RETICCTPCT" in the last 72 hours. Sepsis Labs: Recent Labs  Lab 09/22/23 1845 09/22/23 2025  LATICACIDVEN 0.4* 0.4*    Recent Results (from the past 240 hours)  Resp panel by RT-PCR (RSV, Flu A&B, Covid) Anterior Nasal Swab     Status: None   Collection Time: 09/22/23  6:45 PM   Specimen: Anterior Nasal Swab  Result Value Ref Range Status   SARS Coronavirus 2 by RT PCR NEGATIVE NEGATIVE Final    Comment: (NOTE) SARS-CoV-2 target nucleic acids are NOT DETECTED.  The SARS-CoV-2 RNA is generally detectable in upper respiratory specimens during the acute phase of infection. The lowest concentration of SARS-CoV-2 viral copies this assay can detect is 138 copies/mL. A negative result does not preclude SARS-Cov-2 infection and should not be used as the sole basis for treatment or other patient management decisions. A negative result may occur with  improper specimen collection/handling, submission of specimen other than nasopharyngeal swab, presence of viral  mutation(s) within the areas targeted by this assay, and inadequate number of viral copies(<138 copies/mL). A negative result must be combined with clinical observations, patient history, and epidemiological information. The expected result is Negative.  Fact Sheet for Patients:  BloggerCourse.com  Fact Sheet for Healthcare Providers:  SeriousBroker.it  This test is no t yet approved or cleared by the [place] FDA and  has been authorized for detection and/or diagnosis of SARS-CoV-2 by FDA under an Emergency Use Authorization (EUA). This EUA will remain  in effect (meaning this test can be used) for the duration of the COVID-19 declaration under Section 564(b)(1) of the Act, 21 U.S.C.section 360bbb-3(b)(1), unless the authorization is terminated  or revoked sooner.       Influenza A by PCR NEGATIVE NEGATIVE Final   Influenza B by PCR NEGATIVE NEGATIVE Final    Comment: (NOTE) The Xpert Xpress SARS-CoV-2/FLU/RSV plus assay is intended as an aid in the diagnosis of influenza from Nasopharyngeal swab specimens and should not be used as a sole basis for treatment. Nasal washings and aspirates  are unacceptable for Xpert Xpress SARS-CoV-2/FLU/RSV testing.  Fact Sheet for Patients: BloggerCourse.com  Fact Sheet for Healthcare Providers: SeriousBroker.it  This test is not yet approved or cleared by the [place] FDA and has been authorized for detection and/or diagnosis of SARS-CoV-2 by FDA under an Emergency Use Authorization (EUA). This EUA will remain in effect (meaning this test can be used) for the duration of the COVID-19 declaration under Section 564(b)(1) of the Act, 21 U.S.C. section 360bbb-3(b)(1), unless the authorization is terminated or revoked.     Resp Syncytial Virus by PCR NEGATIVE NEGATIVE Final    Comment: (NOTE) Fact Sheet for  Patients: BloggerCourse.com  Fact Sheet for Healthcare Providers: SeriousBroker.it  This test is not yet approved or cleared by the [place] FDA and has been authorized for detection and/or diagnosis of SARS-CoV-2 by FDA under an Emergency Use Authorization (EUA). This EUA will remain in effect (meaning this test can be used) for the duration of the COVID-19 declaration under Section 564(b)(1) of the Act, 21 U.S.C. section 360bbb-3(b)(1), unless the authorization is terminated or revoked.  Performed at [facility], [address] [place], [place] [address]          Radiology Studies: CT ABDOMEN PELVIS W CONTRAST Result Date: 09/23/2023 CLINICAL DATA:  Acute abdominal pain EXAM: CT ABDOMEN AND PELVIS WITH CONTRAST TECHNIQUE: Multidetector CT imaging of the abdomen and pelvis was performed using the standard protocol following bolus administration of intravenous contrast. RADIATION DOSE REDUCTION: This exam was performed according to the departmental dose-optimization program which includes automated exposure control, adjustment of the mA and/or kV according to patient size and/or use of iterative reconstruction technique. CONTRAST:  75mL OMNIPAQUE IOHEXOL 300 MG/ML  SOLN COMPARISON:  CT abdomen and pelvis 03/27/2023 FINDINGS: Lower chest: Small to moderate-sized pericardial effusion is unchanged. The heart is enlarged, unchanged. Visualized lung bases are clear. Hepatobiliary: No focal liver abnormality is seen. A small gallstone is present. There is no bile duct dilatation. Pancreas: Unremarkable. No pancreatic ductal dilatation or surrounding inflammatory changes. Spleen: Normal in size without focal abnormality. Adrenals/Urinary Tract: There is limited evaluation of the kidney secondary to streak artifact. On delayed imaging, there is questionable focal solid-appearing lesion in the posterior lower pole of the left  kidney measuring 2.6 x 3.0 cm image 20/23. Otherwise, the kidneys and adrenal glands are within normal limits. There is diffuse bladder wall thickening with submucosal enhancement and surrounding inflammatory chest stranding. Stomach/Bowel: Stomach is within normal limits. Appendix appears normal. No evidence of bowel wall thickening, distention, or inflammatory changes. There is large stool burden. Vascular/Lymphatic: Aortic atherosclerosis. No enlarged abdominal or pelvic lymph nodes. Reproductive: Uterus and bilateral adnexa are unremarkable. Other: There is a small amount of free fluid in the pelvis. Musculoskeletal: Left hip arthroplasty is present. There is scoliosis of the thoracolumbar spine with fusion rod. There is a transverse fracture through the T10 vertebral body which appears subacute. IMPRESSION: 1. Diffuse bladder wall thickening with submucosal enhancement and surrounding inflammatory stranding compatible with cystitis. 2. Small amount of free fluid in the pelvis. 3. Cholelithiasis. 4. Stable small to moderate-sized pericardial effusion. 5. Subacute appearing transverse fracture through the T10 vertebral body. Please correlate clinically. 6. Aortic atherosclerosis. 7. On delayed imaging, there is questionable focal solid-appearing lesion in the posterior lower pole of the left kidney measuring 2.6 x 3.0 cm. Further evaluation recommended with dedicated renal CT. Aortic Atherosclerosis (ICD10-I70.0). Electronically Signed   By: Darliss Cheney M.D.   On: 09/23/2023  00:24   CT Head Wo Contrast Result Date: 09/23/2023 CLINICAL DATA:  Headache EXAM: CT HEAD WITHOUT CONTRAST TECHNIQUE: Contiguous axial images were obtained from the base of the skull through the vertex without intravenous contrast. RADIATION DOSE REDUCTION: This exam was performed according to the departmental dose-optimization program which includes automated exposure control, adjustment of the mA and/or kV according to patient size  and/or use of iterative reconstruction technique. COMPARISON:  Head CT 06/29/2023 FINDINGS: Brain: No evidence of acute infarction, hemorrhage, hydrocephalus, extra-axial collection or mass lesion/mass effect. Again seen is mild diffuse atrophy and mild patchy periventricular and deep white matter hypodensity. Vascular: Atherosclerotic calcifications are present within the cavernous internal carotid arteries. Skull: Normal. Negative for fracture or focal lesion. Sinuses/Orbits: No acute finding. Other: None. IMPRESSION: 1. No acute intracranial process. 2. Mild diffuse atrophy and mild chronic small vessel ischemic changes. Electronically Signed   By: Darliss Cheney M.D.   On: 09/23/2023 00:16   DG Chest Portable 1 View Result Date: 09/22/2023 CLINICAL DATA:  Altered mental status EXAM: PORTABLE CHEST 1 VIEW COMPARISON:  Chest x-ray 06/29/2023 FINDINGS: Heart is mildly enlarged, unchanged. There is no pleural effusion or pneumothorax. Both lungs are clear. Scoliosis and fixation rods are again seen. IMPRESSION: No active disease. Mild cardiomegaly. Electronically Signed   By: Darliss Cheney M.D.   On: 09/22/2023 19:22   Scheduled Meds:  atorvastatin  40 mg Oral QHS   benztropine  1 mg Oral BID   clopidogrel  75 mg Oral Daily   enoxaparin (LOVENOX) injection  40 mg Subcutaneous Q24H   fluPHENAZine  20 mg Oral BID   lisinopril  20 mg Oral Daily   lithium carbonate  150 mg Oral QHS   LORazepam  0.5 mg Oral QID   lurasidone  120 mg Oral Daily   melatonin  5 mg Oral QHS   metoprolol tartrate  25 mg Oral BID   sertraline  150 mg Oral Daily   Continuous Infusions:  [START ON 09/24/2023] cefTRIAXone (ROCEPHIN)  IV       LOS: 0 days   Time spent:  Azucena Fallen, DO [facility]  If 7PM-7AM, please contact night-coverage [email or web address]  09/23/2023, 7:23 AM

## 2023-09-23 NOTE — ED Notes (Addendum)
Patient given a bed bath and changed.

## 2023-09-23 NOTE — ED Notes (Signed)
ED TO INPATIENT HANDOFF REPORT  ED Nurse Name and Phone #: Margo Aye  S Name/Age/Gender Heidi Blevins 73 y.o. female Room/Bed: WA02/WA02  Code Status   Code Status: Full Code  Home/SNF/Other Nursing Home Patient oriented to: self Is this baseline? Yes   Triage Complete: Triage complete  Chief Complaint Acute cystitis [N30.00]  Triage Note BIBA from [facility] for altered mental status. Pt son came to visit her today and said she didn't recognize him. Per facility, pt is at baseline, and does get recurrent UTIs. 173/91 BP 68 HR 97% room air 16 RR 177 cbg   Allergies Allergies  Allergen Reactions   Sulfonamide Derivatives Hives         Level of Care/Admitting Diagnosis ED Disposition     ED Disposition  Admit   Condition  --   Comment  Hospital Area: [facility] [[id number]]  Level of Care: Med-Surg [16]  May place patient in observation at [facility] or [facility] if equivalent level of care is available:: Yes  Covid Evaluation: Confirmed COVID Negative  Diagnosis: Acute cystitis [595.0.ICD-9-CM]  Admitting Physician: Briscoe Deutscher [[id number]]  Attending Physician: Briscoe Deutscher [[id number]]          B Medical/Surgery History Past Medical History:  Diagnosis Date   Anxiety    Arrhythmia    Bifascicular block    Chronic diastolic heart failure (HCC)    Depression    Hypertension    Insomnia    Schizoaffective disorder    Scoliosis    Thyroid nodule 08/09/2020   Vitamin D deficiency    Past Surgical History:  Procedure Laterality Date   BACK SURGERY     "as a child after she fell out of a tree"   TONSILLECTOMY     TOTAL HIP ARTHROPLASTY Left 09/27/2018   Procedure: LEFT TOTAL HIP ARTHROPLASTY ANTERIOR APPROACH;  Surgeon: Tarry Kos, MD;  Location: [facility] OR;  Service: Orthopedics;  Laterality: Left;     A IV Location/Drains/Wounds Patient Lines/Drains/Airways Status     Active Line/Drains/Airways     Name Placement  date Placement time Site Days   Peripheral IV 09/22/23 20 G Right Antecubital 09/22/23  1847  Antecubital  1            Intake/Output Last 24 hours  Intake/Output Summary (Last 24 hours) at 09/23/2023 0721 Last data filed at 09/23/2023 0213 Gross per 24 hour  Intake 100 ml  Output --  Net 100 ml    Labs/Imaging Results for orders placed or performed during the hospital encounter of 09/22/23 (from the past 48 hours)  CBC with Differential     Status: Abnormal   Collection Time: 09/22/23  6:24 PM  Result Value Ref Range   WBC 16.2 (H) 4.0 - 10.5 K/uL   RBC 3.94 3.87 - 5.11 MIL/uL   Hemoglobin 12.1 12.0 - 15.0 g/dL   HCT 24.4 01.0 - 27.2 %   MCV 96.2 80.0 - 100.0 fL   MCH 30.7 26.0 - 34.0 pg   MCHC 31.9 30.0 - 36.0 g/dL   RDW 53.6 64.4 - 03.4 %   Platelets 297 150 - 400 K/uL   nRBC 0.0 0.0 - 0.2 %   Neutrophils Relative % 77 %   Neutro Abs 12.5 (H) 1.7 - 7.7 K/uL   Lymphocytes Relative 13 %   Lymphs Abs 2.1 0.7 - 4.0 K/uL   Monocytes Relative 8 %   Monocytes Absolute 1.2 (H) 0.1 - 1.0  K/uL   Eosinophils Relative 1 %   Eosinophils Absolute 0.1 0.0 - 0.5 K/uL   Basophils Relative 0 %   Basophils Absolute 0.1 0.0 - 0.1 K/uL   Immature Granulocytes 1 %   Abs Immature Granulocytes 0.08 (H) 0.00 - 0.07 K/uL    Comment: Performed at [facility], [address], [place], [place] [address]  Comprehensive metabolic panel     Status: Abnormal   Collection Time: 09/22/23  6:24 PM  Result Value Ref Range   Sodium 137 135 - 145 mmol/L   Potassium 3.5 3.5 - 5.1 mmol/L   Chloride 108 98 - 111 mmol/L   CO2 20 (L) 22 - 32 mmol/L   Glucose, Bld 104 (H) 70 - 99 mg/dL    Comment: Glucose reference range applies only to samples taken after fasting for at least 8 hours.   BUN 21 8 - 23 mg/dL   Creatinine, Ser 2.84 (H) 0.44 - 1.00 mg/dL   Calcium 13.2 (H) 8.9 - 10.3 mg/dL   Total Protein 8.3 (H) 6.5 - 8.1 g/dL   Albumin 3.8 3.5 - 5.0 g/dL   AST 38 15 - 41 U/L    ALT 37 0 - 44 U/L   Alkaline Phosphatase 115 38 - 126 U/L   Total Bilirubin 0.7 0.0 - 1.2 mg/dL   GFR, Estimated 56 (L) >60 mL/min    Comment: (NOTE) Calculated using the CKD-EPI Creatinine Equation (2021)    Anion gap 9 5 - 15    Comment: Performed at [facility], [address] [place], [place] [address]  Lithium level     Status: Abnormal   Collection Time: 09/22/23  6:24 PM  Result Value Ref Range   Lithium Lvl 0.20 (L) 0.60 - 1.20 mmol/L    Comment: Performed at [facility], [address], [place], [place] [address]  I-Stat CG4 Lactic Acid     Status: Abnormal   Collection Time: 09/22/23  6:45 PM  Result Value Ref Range   Lactic Acid, Venous 0.4 (L) 0.5 - 1.9 mmol/L  Resp panel by RT-PCR (RSV, Flu A&B, Covid) Anterior Nasal Swab     Status: None   Collection Time: 09/22/23  6:45 PM   Specimen: Anterior Nasal Swab  Result Value Ref Range   SARS Coronavirus 2 by RT PCR NEGATIVE NEGATIVE    Comment: (NOTE) SARS-CoV-2 target nucleic acids are NOT DETECTED.  The SARS-CoV-2 RNA is generally detectable in upper respiratory specimens during the acute phase of infection. The lowest concentration of SARS-CoV-2 viral copies this assay can detect is 138 copies/mL. A negative result does not preclude SARS-Cov-2 infection and should not be used as the sole basis for treatment or other patient management decisions. A negative result may occur with  improper specimen collection/handling, submission of specimen other than nasopharyngeal swab, presence of viral mutation(s) within the areas targeted by this assay, and inadequate number of viral copies(<138 copies/mL). A negative result must be combined with clinical observations, patient history, and epidemiological information. The expected result is Negative.  Fact Sheet for Patients:  BloggerCourse.com  Fact Sheet for Healthcare Providers:   SeriousBroker.it  This test is no t yet approved or cleared by the [place] FDA and  has been authorized for detection and/or diagnosis of SARS-CoV-2 by FDA under an Emergency Use Authorization (EUA). This EUA will remain  in effect (meaning this test can be used) for the duration of the COVID-19 declaration under Section  564(b)(1) of the Act, 21 U.S.C.section 360bbb-3(b)(1), unless the authorization is terminated  or revoked sooner.       Influenza A by PCR NEGATIVE NEGATIVE   Influenza B by PCR NEGATIVE NEGATIVE    Comment: (NOTE) The Xpert Xpress SARS-CoV-2/FLU/RSV plus assay is intended as an aid in the diagnosis of influenza from Nasopharyngeal swab specimens and should not be used as a sole basis for treatment. Nasal washings and aspirates are unacceptable for Xpert Xpress SARS-CoV-2/FLU/RSV testing.  Fact Sheet for Patients: BloggerCourse.com  Fact Sheet for Healthcare Providers: SeriousBroker.it  This test is not yet approved or cleared by the [place] FDA and has been authorized for detection and/or diagnosis of SARS-CoV-2 by FDA under an Emergency Use Authorization (EUA). This EUA will remain in effect (meaning this test can be used) for the duration of the COVID-19 declaration under Section 564(b)(1) of the Act, 21 U.S.C. section 360bbb-3(b)(1), unless the authorization is terminated or revoked.     Resp Syncytial Virus by PCR NEGATIVE NEGATIVE    Comment: (NOTE) Fact Sheet for Patients: BloggerCourse.com  Fact Sheet for Healthcare Providers: SeriousBroker.it  This test is not yet approved or cleared by the [place] FDA and has been authorized for detection and/or diagnosis of SARS-CoV-2 by FDA under an Emergency Use Authorization (EUA). This EUA will remain in effect (meaning this test can be used) for the duration of  the COVID-19 declaration under Section 564(b)(1) of the Act, 21 U.S.C. section 360bbb-3(b)(1), unless the authorization is terminated or revoked.  Performed at [facility], [address], [place], [place] [address]   POC CBG, ED     Status: Abnormal   Collection Time: 09/22/23  7:49 PM  Result Value Ref Range   Glucose-Capillary 100 (H) 70 - 99 mg/dL    Comment: Glucose reference range applies only to samples taken after fasting for at least 8 hours.  I-Stat CG4 Lactic Acid     Status: Abnormal   Collection Time: 09/22/23  8:25 PM  Result Value Ref Range   Lactic Acid, Venous 0.4 (L) 0.5 - 1.9 mmol/L  Urinalysis, w/ Reflex to Culture (Infection Suspected) -     Status: Abnormal   Collection Time: 09/22/23 10:30 PM  Result Value Ref Range   Specimen Source URINE, CLEAN CATCH    Color, Urine YELLOW YELLOW   APPearance CLOUDY (A) CLEAR   Specific Gravity, Urine 1.016 1.005 - 1.030   pH 7.0 5.0 - 8.0   Glucose, UA NEGATIVE NEGATIVE mg/dL   Hgb urine dipstick SMALL (A) NEGATIVE   Bilirubin Urine NEGATIVE NEGATIVE   Ketones, ur NEGATIVE NEGATIVE mg/dL   Protein, ur >=604 (A) NEGATIVE mg/dL   Nitrite NEGATIVE NEGATIVE   Leukocytes,Ua MODERATE (A) NEGATIVE   RBC / HPF 21-50 0 - 5 RBC/hpf   WBC, UA >50 0 - 5 WBC/hpf    Comment:        Reflex urine culture not performed if WBC <=10, OR if Squamous epithelial cells >5. If Squamous epithelial cells >5 suggest recollection.    Bacteria, UA MANY (A) NONE SEEN   Squamous Epithelial / HPF 0-5 0 - 5 /HPF   WBC Clumps PRESENT     Comment: Performed at [facility], [address] [place], [place] [address]   CT ABDOMEN PELVIS W CONTRAST Result Date: 09/23/2023 CLINICAL DATA:  Acute abdominal pain EXAM: CT ABDOMEN AND PELVIS WITH CONTRAST TECHNIQUE: Multidetector CT imaging of the abdomen and pelvis was performed  using the standard protocol following bolus administration of intravenous contrast.  RADIATION DOSE REDUCTION: This exam was performed according to the departmental dose-optimization program which includes automated exposure control, adjustment of the mA and/or kV according to patient size and/or use of iterative reconstruction technique. CONTRAST:  75mL OMNIPAQUE IOHEXOL 300 MG/ML  SOLN COMPARISON:  CT abdomen and pelvis 03/27/2023 FINDINGS: Lower chest: Small to moderate-sized pericardial effusion is unchanged. The heart is enlarged, unchanged. Visualized lung bases are clear. Hepatobiliary: No focal liver abnormality is seen. A small gallstone is present. There is no bile duct dilatation. Pancreas: Unremarkable. No pancreatic ductal dilatation or surrounding inflammatory changes. Spleen: Normal in size without focal abnormality. Adrenals/Urinary Tract: There is limited evaluation of the kidney secondary to streak artifact. On delayed imaging, there is questionable focal solid-appearing lesion in the posterior lower pole of the left kidney measuring 2.6 x 3.0 cm image 20/23. Otherwise, the kidneys and adrenal glands are within normal limits. There is diffuse bladder wall thickening with submucosal enhancement and surrounding inflammatory chest stranding. Stomach/Bowel: Stomach is within normal limits. Appendix appears normal. No evidence of bowel wall thickening, distention, or inflammatory changes. There is large stool burden. Vascular/Lymphatic: Aortic atherosclerosis. No enlarged abdominal or pelvic lymph nodes. Reproductive: Uterus and bilateral adnexa are unremarkable. Other: There is a small amount of free fluid in the pelvis. Musculoskeletal: Left hip arthroplasty is present. There is scoliosis of the thoracolumbar spine with fusion rod. There is a transverse fracture through the T10 vertebral body which appears subacute. IMPRESSION: 1. Diffuse bladder wall thickening with submucosal enhancement and surrounding inflammatory stranding compatible with cystitis. 2. Small amount of free fluid  in the pelvis. 3. Cholelithiasis. 4. Stable small to moderate-sized pericardial effusion. 5. Subacute appearing transverse fracture through the T10 vertebral body. Please correlate clinically. 6. Aortic atherosclerosis. 7. On delayed imaging, there is questionable focal solid-appearing lesion in the posterior lower pole of the left kidney measuring 2.6 x 3.0 cm. Further evaluation recommended with dedicated renal CT. Aortic Atherosclerosis (ICD10-I70.0). Electronically Signed   By: Darliss Cheney M.D.   On: 09/23/2023 00:24   CT Head Wo Contrast Result Date: 09/23/2023 CLINICAL DATA:  Headache EXAM: CT HEAD WITHOUT CONTRAST TECHNIQUE: Contiguous axial images were obtained from the base of the skull through the vertex without intravenous contrast. RADIATION DOSE REDUCTION: This exam was performed according to the departmental dose-optimization program which includes automated exposure control, adjustment of the mA and/or kV according to patient size and/or use of iterative reconstruction technique. COMPARISON:  Head CT 06/29/2023 FINDINGS: Brain: No evidence of acute infarction, hemorrhage, hydrocephalus, extra-axial collection or mass lesion/mass effect. Again seen is mild diffuse atrophy and mild patchy periventricular and deep white matter hypodensity. Vascular: Atherosclerotic calcifications are present within the cavernous internal carotid arteries. Skull: Normal. Negative for fracture or focal lesion. Sinuses/Orbits: No acute finding. Other: None. IMPRESSION: 1. No acute intracranial process. 2. Mild diffuse atrophy and mild chronic small vessel ischemic changes. Electronically Signed   By: Darliss Cheney M.D.   On: 09/23/2023 00:16   DG Chest Portable 1 View Result Date: 09/22/2023 CLINICAL DATA:  Altered mental status EXAM: PORTABLE CHEST 1 VIEW COMPARISON:  Chest x-ray 06/29/2023 FINDINGS: Heart is mildly enlarged, unchanged. There is no pleural effusion or pneumothorax. Both lungs are clear. Scoliosis  and fixation rods are again seen. IMPRESSION: No active disease. Mild cardiomegaly. Electronically Signed   By: Darliss Cheney M.D.   On: 09/22/2023 19:22    Pending Labs Unresulted Labs (From admission,  onward)     Start     Ordered   09/30/23 0500  Creatinine, serum  (enoxaparin (LOVENOX)    CrCl >/= 30 ml/min)  Weekly,   R     Comments: while on enoxaparin therapy    09/23/23 0204   09/23/23 0500  Basic metabolic panel  Daily,   R      09/23/23 0204   09/23/23 0500  CBC  Daily,   R      09/23/23 0204   09/22/23 2230  Urine Culture  Once,   AD        09/22/23 2230   09/22/23 1824  Blood culture (routine x 2)  BLOOD CULTURE X 2,   R (with STAT occurrences)      09/22/23 1824            Vitals/Pain Today's Vitals   09/23/23 0234 09/23/23 0329 09/23/23 0400 09/23/23 0530  BP: (!) 159/106  (!) 173/100 (!) 159/101  Pulse: 86  68 69  Resp: 19  19 18   Temp: 99.2 F (37.3 C)  99.1 F (37.3 C)   TempSrc: Axillary  Oral   SpO2: 99%  100% 98%  PainSc:  Asleep      Isolation Precautions No active isolations  Medications Medications  atorvastatin (LIPITOR) tablet 40 mg (has no administration in time range)  lisinopril (ZESTRIL) tablet 20 mg (has no administration in time range)  metoprolol tartrate (LOPRESSOR) tablet 25 mg (25 mg Oral Given 09/23/23 0230)  fluPHENAZine (PROLIXIN) tablet 20 mg (20 mg Oral Given 09/23/23 0231)  lithium carbonate capsule 150 mg (has no administration in time range)  LORazepam (ATIVAN) tablet 0.5 mg (has no administration in time range)  lurasidone (LATUDA) tablet 120 mg (has no administration in time range)  sertraline (ZOLOFT) tablet 150 mg (has no administration in time range)  clopidogrel (PLAVIX) tablet 75 mg (has no administration in time range)  melatonin tablet 5 mg (has no administration in time range)  benztropine (COGENTIN) tablet 1 mg (1 mg Oral Given 09/23/23 0231)  enoxaparin (LOVENOX) injection 40 mg (has no administration in time  range)  acetaminophen (TYLENOL) tablet 650 mg (has no administration in time range)    Or  acetaminophen (TYLENOL) suppository 650 mg (has no administration in time range)  oxyCODONE (Oxy IR/ROXICODONE) immediate release tablet 5 mg (has no administration in time range)  polyethylene glycol (MIRALAX / GLYCOLAX) packet 17 g (has no administration in time range)  prochlorperazine (COMPAZINE) injection 5 mg (has no administration in time range)  cefTRIAXone (ROCEPHIN) 1 g in sodium chloride 0.9 % 100 mL IVPB (has no administration in time range)  fentaNYL (SUBLIMAZE) injection 50 mcg (50 mcg Intravenous Given 09/22/23 2244)  iohexol (OMNIPAQUE) 300 MG/ML solution 75 mL (75 mLs Intravenous Contrast Given 09/22/23 2323)  cefTRIAXone (ROCEPHIN) 2 g in sodium chloride 0.9 % 100 mL IVPB (0 g Intravenous Stopped 09/23/23 4098)    Mobility manual wheelchair     Focused Assessments Ams    R Recommendations: See Admitting Provider Note  Report given to:   Additional Notes: Confused per baseline , incontinent

## 2023-09-23 NOTE — Plan of Care (Signed)
  Problem: Education: Goal: Knowledge of General Education information will improve Description: Including pain rating scale, medication(s)/side effects and non-pharmacologic comfort measures Outcome: Not Applicable   Problem: Clinical Measurements: Goal: Ability to maintain clinical measurements within normal limits will improve Outcome: Not Applicable

## 2023-09-23 NOTE — ED Provider Notes (Signed)
  Physical Exam  BP (!) 161/96   Pulse 75   Temp 98.5 F (36.9 C)   Resp 17   SpO2 98%   Physical Exam Vitals and nursing note reviewed.  Constitutional:      General: She is not in acute distress.    Appearance: She is well-developed.  HENT:     Head: Normocephalic and atraumatic.  Eyes:     Conjunctiva/sclera: Conjunctivae normal.  Cardiovascular:     Rate and Rhythm: Normal rate and regular rhythm.     Heart sounds: No murmur heard. Pulmonary:     Effort: Pulmonary effort is normal. No respiratory distress.     Breath sounds: Normal breath sounds.  Abdominal:     Palpations: Abdomen is soft.     Tenderness: There is no abdominal tenderness.  Musculoskeletal:        General: No swelling.     Cervical back: Neck supple.  Skin:    General: Skin is warm and dry.     Capillary Refill: Capillary refill takes less than 2 seconds.  Neurological:     Mental Status: She is alert. She is disoriented.  Psychiatric:        Mood and Affect: Mood normal.     Procedures  Procedures  ED Course / MDM    Medical Decision Making Amount and/or Complexity of Data Reviewed Labs: ordered. Radiology: ordered.  Risk Prescription drug management. Decision regarding hospitalization.   Patient received in handoff.  Altered mental status worse than baseline.  Pending urinalysis and CT imaging at time of signout.  CT imaging concerning for cystitis.  Urinalysis also concerning for cystitis.  Ceftriaxone initiated and patient require hospital mission for altered mental status and urinary tract infection.       Glendora Score, MD 09/23/23 [phone]

## 2023-09-23 NOTE — H&P (Addendum)
History and Physical    Heidi Blevins:[id number] DOB: 1951-03-20 DOA: 09/22/2023  PCP: Pcp, No   Patient coming from: ALF   Chief Complaint: Somnolent, confusion  HPI: Heidi Blevins is a 73 y.o. female with medical history significant for hypertension, history of CVA, schizoaffective disorder, depression, anxiety, insomnia, and CKD 3A who presents with somnolence and confusion.  Family reports that the patient has been sleeping more for the past 3 days, not smoking her morning cigarette as usual, and then was unable to recognize family when they visited yesterday.  Family notes that she has behaved similarly multiple times in the past when she had a UTI.  The patient does not offer any complaints, but when specifically asked, she reports dysuria and lower abdominal pain but denies headache or chest pain.   She previously had a Foley catheter up until 2 weeks ago per report of family.  ED Course: Upon arrival to the ED, patient is found to be afebrile and saturating well on room air with normal heart rate and elevated blood pressure.  Labs are most notable for creatinine 1.05, WBC 16,200, negative respiratory virus panel, and lactic acid 0.4.  CT head is negative for acute findings.  CT of the abdomen pelvis is notable for diffuse urinary bladder wall thickening with enhancement and inflammatory stranding, small to moderate pleural effusion, and left renal lesion.  Blood and urine cultures were collected in the ED and the patient was treated with Rocephin and fentanyl.  Review of Systems:  All other systems reviewed and apart from HPI, are negative.  Past Medical History:  Diagnosis Date   Anxiety    Arrhythmia    Bifascicular block    Chronic diastolic heart failure (HCC)    Depression    Hypertension    Insomnia    Schizoaffective disorder    Scoliosis    Thyroid nodule 08/09/2020   Vitamin D deficiency     Past Surgical History:  Procedure Laterality Date    BACK SURGERY     "as a child after she fell out of a tree"   TONSILLECTOMY     TOTAL HIP ARTHROPLASTY Left 09/27/2018   Procedure: LEFT TOTAL HIP ARTHROPLASTY ANTERIOR APPROACH;  Surgeon: Tarry Kos, MD;  Location: [facility] OR;  Service: Orthopedics;  Laterality: Left;    Social History:   reports that she has been smoking cigarettes. She has a 10 pack-year smoking history. She has never used smokeless tobacco. She reports that she does not drink alcohol and does not use drugs.  Allergies  Allergen Reactions   Sulfonamide Derivatives Hives         Family History  Problem Relation Age of Onset   CVA Mother    Hypertension Mother    Lupus Sister    Hypertension Brother    Peptic Ulcer Disease Father    Colon cancer Neg Hx      Prior to Admission medications   Medication Sig Start Date End Date Taking? Authorizing Provider  acetaminophen (TYLENOL) 500 MG tablet Take 1,000 mg by mouth in the morning and at bedtime.    [provider]  atorvastatin (LIPITOR) 40 MG tablet Take 1 tablet (40 mg total) by mouth daily. Patient taking differently: Take 40 mg by mouth at bedtime. 08/13/20   Rolly Salter, MD  benztropine (COGENTIN) 1 MG tablet Take 1 mg by mouth 2 (two) times daily.    [provider]  cephALEXin (KEFLEX) 500 MG capsule Take  1 capsule (500 mg total) by mouth 4 (four) times daily. 06/29/23   Arby Barrette, MD  clopidogrel (PLAVIX) 75 MG tablet Take 1 tablet (75 mg total) by mouth daily. 08/13/20   Rolly Salter, MD  Cranberry 425 MG CAPS Take 425 mg by mouth 2 (two) times a day.    [provider]  ferrous sulfate (FEROSUL) 325 (65 FE) MG tablet Take 325 mg by mouth daily with breakfast.    [provider]  fluPHENAZine (PROLIXIN) 10 MG tablet Take 20 mg by mouth in the morning and at bedtime.    [provider]  hydrocortisone 2.5 % ointment Apply 1 Application topically daily as needed (dry, itchy skin).    [provider]  ibuprofen (ADVIL) 600 MG tablet Take 600 mg by mouth 2 (two) times daily as needed. 06/22/23   [provider]  lisinopril (ZESTRIL) 20 MG tablet Take 20 mg by mouth daily. 04/18/22   [provider]  lithium carbonate 150 MG capsule Take 1 capsule (150 mg total) by mouth at bedtime. 04/25/22 06/29/23  Lanae Boast, MD  loratadine (CLARITIN) 10 MG tablet Take 10 mg by mouth daily.    [provider]  LORazepam (ATIVAN) 0.5 MG tablet Take 0.5 mg by mouth in the morning, at noon, in the evening, and at bedtime.    [provider]  Lurasidone HCl (LATUDA) 120 MG TABS Take 120 mg by mouth daily.    [provider]  melatonin 5 MG TABS Take 5 mg by mouth at bedtime.    [provider]  metoprolol tartrate (LOPRESSOR) 25 MG tablet Take 25 mg by mouth 2 (two) times daily. 06/13/23   [provider]  Multiple Vitamins-Minerals (MULTIVITAMINS THER. W/MINERALS) TABS tablet Take 1 tablet by mouth daily.    [provider]  polyethylene glycol powder (GLYCOLAX/MIRALAX) 17 GM/SCOOP powder Take 17 g by mouth daily.    [provider]  Pramoxine HCl (VAGISIL ANTI-ITCH MEDICATED) 1 % MISC Apply vaginally once a day Apply 1 application topically daily as needed (for vaginal itching). Apply vaginally once a day Patient taking differently: Apply 1 application  topically daily as needed (for vaginal itching). 03/25/19   Rodolph Bong, MD  Propylene Glycol (SYSTANE BALANCE OP) Place 1 drop into both eyes 3 (three) times daily.    [provider]  sertraline (ZOLOFT) 100 MG tablet Take 150 mg by mouth daily.    [provider]  simethicone (MYLICON) 80 MG chewable tablet Chew 1 tablet (80 mg total) by mouth 4 (four) times daily. 08/13/20   Rolly Salter, MD    Physical Exam: Vitals:   09/22/23 1825 09/22/23 1930 09/22/23 2200 09/22/23 2253  BP: (!) 147/122 (!) 165/96 (!) 161/96   Pulse: 80 80 75    Resp: 16 17 17    Temp: 98.6 F (37 C)   98.5 F (36.9 C)  TempSrc: Oral     SpO2: 97% 100% 98%     Constitutional: NAD, no pallor or diaphoresis   Eyes: PERTLA, lids and conjunctivae normal ENMT: Mucous membranes are dry. Posterior pharynx clear of any exudate or lesions.   Neck: supple, no masses  Respiratory: no wheezing, no crackles. No accessory muscle use.  Cardiovascular: S1 & S2 heard, regular rate and rhythm. No extremity edema.  Abdomen: No distension, no tenderness, soft. Bowel sounds active.  Musculoskeletal: no clubbing / cyanosis. No joint deformity upper and lower extremities.   Skin: no  significant rashes, lesions, ulcers. Warm, dry, well-perfused. Neurologic: CN 2-12 grossly intact. Moving all extremities. Alert and oriented to person only.  Psychiatric: Calm. Cooperative.    Labs and Imaging on Admission: I have personally reviewed following labs and imaging studies  CBC: Recent Labs  Lab 09/22/23 1824  WBC 16.2*  NEUTROABS 12.5*  HGB 12.1  HCT 37.9  MCV 96.2  PLT 297   Basic Metabolic Panel: Recent Labs  Lab 09/22/23 1824  NA 137  K 3.5  CL 108  CO2 20*  GLUCOSE 104*  BUN 21  CREATININE 1.05*  CALCIUM 11.0*   GFR: CrCl cannot be calculated (Unknown ideal weight.). Liver Function Tests: Recent Labs  Lab 09/22/23 1824  AST 38  ALT 37  ALKPHOS 115  BILITOT 0.7  PROT 8.3*  ALBUMIN 3.8   No results for input(s): "LIPASE", "AMYLASE" in the last 168 hours. No results for input(s): "AMMONIA" in the last 168 hours. Coagulation Profile: No results for input(s): "INR", "PROTIME" in the last 168 hours. Cardiac Enzymes: No results for input(s): "CKTOTAL", "CKMB", "CKMBINDEX", "TROPONINI" in the last 168 hours. BNP (last 3 results) No results for input(s): "PROBNP" in the last 8760 hours. HbA1C: No results for input(s): "HGBA1C" in the last 72 hours. CBG: Recent Labs  Lab 09/22/23 1949  GLUCAP 100*   Lipid Profile: No results for  input(s): "CHOL", "HDL", "LDLCALC", "TRIG", "CHOLHDL", "LDLDIRECT" in the last 72 hours. Thyroid Function Tests: No results for input(s): "TSH", "T4TOTAL", "FREET4", "T3FREE", "THYROIDAB" in the last 72 hours. Anemia Panel: No results for input(s): "VITAMINB12", "FOLATE", "FERRITIN", "TIBC", "IRON", "RETICCTPCT" in the last 72 hours. Urine analysis:    Component Value Date/Time   COLORURINE YELLOW 09/22/2023 2230   APPEARANCEUR CLOUDY (A) 09/22/2023 2230   LABSPEC 1.016 09/22/2023 2230   PHURINE 7.0 09/22/2023 2230   GLUCOSEU NEGATIVE 09/22/2023 2230   HGBUR SMALL (A) 09/22/2023 2230   BILIRUBINUR NEGATIVE 09/22/2023 2230   KETONESUR NEGATIVE 09/22/2023 2230   PROTEINUR >=300 (A) 09/22/2023 2230   UROBILINOGEN 1.0 08/05/2011 1751   NITRITE NEGATIVE 09/22/2023 2230   LEUKOCYTESUR MODERATE (A) 09/22/2023 2230   Sepsis Labs: @LABRCNTIP (procalcitonin:4,lacticidven:4) ) Recent Results (from the past 240 hours)  Resp panel by RT-PCR (RSV, Flu A&B, Covid) Anterior Nasal Swab     Status: None   Collection Time: 09/22/23  6:45 PM   Specimen: Anterior Nasal Swab  Result Value Ref Range Status   SARS Coronavirus 2 by RT PCR NEGATIVE NEGATIVE Final    Comment: (NOTE) SARS-CoV-2 target nucleic acids are NOT DETECTED.  The SARS-CoV-2 RNA is generally detectable in upper respiratory specimens during the acute phase of infection. The lowest concentration of SARS-CoV-2 viral copies this assay can detect is 138 copies/mL. A negative result does not preclude SARS-Cov-2 infection and should not be used as the sole basis for treatment or other patient management decisions. A negative result may occur with  improper specimen collection/handling, submission of specimen other than nasopharyngeal swab, presence of viral mutation(s) within the areas targeted by this assay, and inadequate number of viral copies(<138 copies/mL). A negative result must be combined with clinical observations, patient  history, and epidemiological information. The expected result is Negative.  Fact Sheet for Patients:  BloggerCourse.com  Fact Sheet for Healthcare Providers:  SeriousBroker.it  This test is no t yet approved or cleared by the [place] FDA and  has been authorized for detection and/or diagnosis of SARS-CoV-2 by FDA under an Emergency Use Authorization (EUA). This EUA will  remain  in effect (meaning this test can be used) for the duration of the COVID-19 declaration under Section 564(b)(1) of the Act, 21 U.S.C.section 360bbb-3(b)(1), unless the authorization is terminated  or revoked sooner.       Influenza A by PCR NEGATIVE NEGATIVE Final   Influenza B by PCR NEGATIVE NEGATIVE Final    Comment: (NOTE) The Xpert Xpress SARS-CoV-2/FLU/RSV plus assay is intended as an aid in the diagnosis of influenza from Nasopharyngeal swab specimens and should not be used as a sole basis for treatment. Nasal washings and aspirates are unacceptable for Xpert Xpress SARS-CoV-2/FLU/RSV testing.  Fact Sheet for Patients: BloggerCourse.com  Fact Sheet for Healthcare Providers: SeriousBroker.it  This test is not yet approved or cleared by the [place] FDA and has been authorized for detection and/or diagnosis of SARS-CoV-2 by FDA under an Emergency Use Authorization (EUA). This EUA will remain in effect (meaning this test can be used) for the duration of the COVID-19 declaration under Section 564(b)(1) of the Act, 21 U.S.C. section 360bbb-3(b)(1), unless the authorization is terminated or revoked.     Resp Syncytial Virus by PCR NEGATIVE NEGATIVE Final    Comment: (NOTE) Fact Sheet for Patients: BloggerCourse.com  Fact Sheet for Healthcare Providers: SeriousBroker.it  This test is not yet approved or cleared by the [place] FDA  and has been authorized for detection and/or diagnosis of SARS-CoV-2 by FDA under an Emergency Use Authorization (EUA). This EUA will remain in effect (meaning this test can be used) for the duration of the COVID-19 declaration under Section 564(b)(1) of the Act, 21 U.S.C. section 360bbb-3(b)(1), unless the authorization is terminated or revoked.  Performed at [facility], [address] [place], [place] [address]      Radiological Exams on Admission: CT ABDOMEN PELVIS W CONTRAST Result Date: 09/23/2023 CLINICAL DATA:  Acute abdominal pain EXAM: CT ABDOMEN AND PELVIS WITH CONTRAST TECHNIQUE: Multidetector CT imaging of the abdomen and pelvis was performed using the standard protocol following bolus administration of intravenous contrast. RADIATION DOSE REDUCTION: This exam was performed according to the departmental dose-optimization program which includes automated exposure control, adjustment of the mA and/or kV according to patient size and/or use of iterative reconstruction technique. CONTRAST:  75mL OMNIPAQUE IOHEXOL 300 MG/ML  SOLN COMPARISON:  CT abdomen and pelvis 03/27/2023 FINDINGS: Lower chest: Small to moderate-sized pericardial effusion is unchanged. The heart is enlarged, unchanged. Visualized lung bases are clear. Hepatobiliary: No focal liver abnormality is seen. A small gallstone is present. There is no bile duct dilatation. Pancreas: Unremarkable. No pancreatic ductal dilatation or surrounding inflammatory changes. Spleen: Normal in size without focal abnormality. Adrenals/Urinary Tract: There is limited evaluation of the kidney secondary to streak artifact. On delayed imaging, there is questionable focal solid-appearing lesion in the posterior lower pole of the left kidney measuring 2.6 x 3.0 cm image 20/23. Otherwise, the kidneys and adrenal glands are within normal limits. There is diffuse bladder wall thickening with submucosal enhancement and surrounding  inflammatory chest stranding. Stomach/Bowel: Stomach is within normal limits. Appendix appears normal. No evidence of bowel wall thickening, distention, or inflammatory changes. There is large stool burden. Vascular/Lymphatic: Aortic atherosclerosis. No enlarged abdominal or pelvic lymph nodes. Reproductive: Uterus and bilateral adnexa are unremarkable. Other: There is a small amount of free fluid in the pelvis. Musculoskeletal: Left hip arthroplasty is present. There is scoliosis of the thoracolumbar spine with fusion rod. There is a transverse fracture through the T10 vertebral body which appears subacute. IMPRESSION: 1.  Diffuse bladder wall thickening with submucosal enhancement and surrounding inflammatory stranding compatible with cystitis. 2. Small amount of free fluid in the pelvis. 3. Cholelithiasis. 4. Stable small to moderate-sized pericardial effusion. 5. Subacute appearing transverse fracture through the T10 vertebral body. Please correlate clinically. 6. Aortic atherosclerosis. 7. On delayed imaging, there is questionable focal solid-appearing lesion in the posterior lower pole of the left kidney measuring 2.6 x 3.0 cm. Further evaluation recommended with dedicated renal CT. Aortic Atherosclerosis (ICD10-I70.0). Electronically Signed   By: Darliss Cheney M.D.   On: 09/23/2023 00:24   CT Head Wo Contrast Result Date: 09/23/2023 CLINICAL DATA:  Headache EXAM: CT HEAD WITHOUT CONTRAST TECHNIQUE: Contiguous axial images were obtained from the base of the skull through the vertex without intravenous contrast. RADIATION DOSE REDUCTION: This exam was performed according to the departmental dose-optimization program which includes automated exposure control, adjustment of the mA and/or kV according to patient size and/or use of iterative reconstruction technique. COMPARISON:  Head CT 06/29/2023 FINDINGS: Brain: No evidence of acute infarction, hemorrhage, hydrocephalus, extra-axial collection or mass  lesion/mass effect. Again seen is mild diffuse atrophy and mild patchy periventricular and deep white matter hypodensity. Vascular: Atherosclerotic calcifications are present within the cavernous internal carotid arteries. Skull: Normal. Negative for fracture or focal lesion. Sinuses/Orbits: No acute finding. Other: None. IMPRESSION: 1. No acute intracranial process. 2. Mild diffuse atrophy and mild chronic small vessel ischemic changes. Electronically Signed   By: Darliss Cheney M.D.   On: 09/23/2023 00:16   DG Chest Portable 1 View Result Date: 09/22/2023 CLINICAL DATA:  Altered mental status EXAM: PORTABLE CHEST 1 VIEW COMPARISON:  Chest x-ray 06/29/2023 FINDINGS: Heart is mildly enlarged, unchanged. There is no pleural effusion or pneumothorax. Both lungs are clear. Scoliosis and fixation rods are again seen. IMPRESSION: No active disease. Mild cardiomegaly. Electronically Signed   By: Darliss Cheney M.D.   On: 09/22/2023 19:22    EKG: Independently reviewed. Sinus rhythm, RBBB, LAFB.   Assessment/Plan   1. UTI  - Continue Rocephin, follow cultures and clinical course    2. Acute encephalopathy  - No acute findings on head CT  - Suspected secondary to UTI and anticipate improvement with treatment   3. Schizoaffective disorder, depression, anxiety   - Continue fluphenazine, lithium, Latuda, Zoloft, Ativan, Cogentin   4. Hx of CVA  - Lipitor, Plavix    5. HTN - Lisinopril, metoprolol   6. CKD 3A  - Appears close to baseline  - Renally-dose medications   7. Pericardial effusion  - Noted on CT in ED, no clinical tamponade, will check echo   8. Left renal lesion  - Questionable left renal lesion noted on CT in ED, dedicated renal CT ordered    DVT prophylaxis: Lovenox  Code Status: Full  Level of Care: Level of care: Med-Surg Family Communication: Son updated from ED  Disposition Plan:  Patient is from: ALF  Anticipated d/c is to: TBD Anticipated d/c date is: 1/27 or 09/25/23   Patient currently: Pending clinical improvement  Consults called: None Admission status: observation     Briscoe Deutscher, MD [facility]  09/23/2023, 1:58 AM

## 2023-09-23 NOTE — ED Notes (Signed)
Pt sleeping no signs of distress

## 2023-09-23 NOTE — Care Management Obs Status (Signed)
MEDICARE OBSERVATION STATUS NOTIFICATION   Patient Details  Name: Heidi Blevins MRN: [id number] Date of Birth: 13-Aug-1951   Medicare Observation Status Notification Given:  Yes  I Windell Moulding, MSW, LCSW, verbally reviewed observation notice with patient's son Peyton Najjar telephonically at [phone].  Consent for verbal signature provided to Windell Moulding, MSW, LCSW.    Darleene Cleaver, LCSW 09/23/2023, 1:55 PM

## 2023-09-23 NOTE — TOC Initial Note (Signed)
Transition of Care [facility]) - Initial/Assessment Note    Patient Details  Name: Heidi Blevins MRN: [id number] Date of Birth: May 17, 1951  Transition of Care [facility]) CM/SW Contact:    Darleene Cleaver, LCSW Phone Number: 09/23/2023, 2:02 PM  Clinical Narrative:                  Patient is a 73 year old female who is alert and oriented x1.  Patient is from [facility] care facility.  Patient has dementia, per son Peyton Najjar, patient plans to return back to ALF once medically ready for discharge.  TOC to continue to follow patient's progress throughout discharge planning.  Expected Discharge Plan: Assisted Living Barriers to Discharge: Continued Medical Work up   Patient Goals and CMS Choice Patient states their goals for this hospitalization and ongoing recovery are:: Per patient's son to return back to memory care ALF. CMS Medicare.gov Compare Post Acute Care list provided to:: Patient Represenative (must comment) Choice offered to / list presented to : Adult Children [place] ownership interest in [facility].provided to:: Adult Children    Expected Discharge Plan and Services     Post Acute Care Choice: Home Health Living arrangements for the past 2 months: Assisted Living Facility                                      Prior Living Arrangements/Services Living arrangements for the past 2 months: Assisted Living Facility Lives with:: Facility Resident Patient language and need for interpreter reviewed:: Yes Do you feel safe going back to the place where you live?: Yes      Need for Family Participation in Patient Care: Yes (Comment) Care giver support system in place?: Yes (comment)   Criminal Activity/Legal Involvement Pertinent to Current Situation/Hospitalization: No - Comment as needed  Activities of Daily Living   ADL Screening (condition at time of admission) Independently performs ADLs?: No Does the patient have a NEW difficulty with  bathing/dressing/toileting/self-feeding that is expected to last >3 days?: No Does the patient have a NEW difficulty with getting in/out of bed, walking, or climbing stairs that is expected to last >3 days?: No Does the patient have a NEW difficulty with communication that is expected to last >3 days?: No Is the patient deaf or have difficulty hearing?: No Does the patient have difficulty seeing, even when wearing glasses/contacts?: No Does the patient have difficulty concentrating, remembering, or making decisions?: Yes  Permission Sought/Granted Permission sought to share information with : Case Manager, Magazine features editor, Family Supports    Share Information with NAME: Modesty, Rudy   [phone]  Permission granted to share info w AGENCY: Memory care ALF        Emotional Assessment Appearance:: Appears stated age Attitude/Demeanor/Rapport: Other (comment) (Confused) Affect (typically observed): Calm, Stable Orientation: : Oriented to Self Alcohol / Substance Use: Not Applicable Psych Involvement: No (comment)  Admission diagnosis:  Acute cystitis [N30.00] Acute cystitis with hematuria [N30.01] Altered mental status, unspecified altered mental status type [R41.82] Patient Active Problem List   Diagnosis Date Noted   CKD stage 3a, GFR 45-59 ml/min (HCC) 09/23/2023   Pericardial effusion 09/23/2023   Kidney lesion, native, left 09/23/2023   Acute cystitis 04/23/2022   Chronic diastolic CHF (congestive heart failure) (HCC) 04/23/2022   GAD (generalized anxiety disorder) 04/23/2022   HLD (hyperlipidemia) 04/23/2022   Polypharmacy 08/09/2020   Acute CVA (  cerebrovascular accident) (HCC) 08/09/2020   Thyroid nodule 08/09/2020   History of CVA (cerebrovascular accident) 08/08/2020   Bradycardia    Acute metabolic encephalopathy    Sepsis secondary to UTI (HCC) 03/20/2019   Closed displaced fracture of left femoral neck (HCC) 09/26/2018   AKI (acute kidney  injury) (HCC) 06/13/2016   UTI (urinary tract infection) 06/13/2016   Essential hypertension 06/13/2016   Urinary tract infection without hematuria    Complicated UTI (urinary tract infection) 05/03/2016   Hypercalcemia 05/03/2016   Dehydration 05/03/2016   Acute encephalopathy 05/03/2016   Cerebrovascular disease 05/03/2016   Schizoaffective disorder (HCC) 05/03/2016   Bifascicular block 05/03/2016   PCP:  Pcp, No Pharmacy:   [facility] [address] [place] [address] Phone: [phone] Fax: [phone]     Social Drivers of Health (SDOH) Social History: SDOH Screenings   Food Insecurity: Patient Unable To Answer (09/23/2023)  Housing: Patient Unable To Answer (09/23/2023)  Transportation Needs: Patient Unable To Answer (09/23/2023)  Utilities: Patient Unable To Answer (09/23/2023)  Social Connections: Unknown (09/23/2023)  Tobacco Use: High Risk (09/23/2023)   SDOH Interventions:     Readmission Risk Interventions     No data to display

## 2023-09-24 ENCOUNTER — Inpatient Hospital Stay (HOSPITAL_COMMUNITY): Payer: 59

## 2023-09-24 ENCOUNTER — Other Ambulatory Visit (HOSPITAL_COMMUNITY): Payer: Self-pay

## 2023-09-24 DIAGNOSIS — I5032 Chronic diastolic (congestive) heart failure: Secondary | ICD-10-CM | POA: Diagnosis not present

## 2023-09-24 DIAGNOSIS — I3139 Other pericardial effusion (noninflammatory): Secondary | ICD-10-CM | POA: Diagnosis not present

## 2023-09-24 DIAGNOSIS — N3000 Acute cystitis without hematuria: Secondary | ICD-10-CM

## 2023-09-24 DIAGNOSIS — Z8673 Personal history of transient ischemic attack (TIA), and cerebral infarction without residual deficits: Secondary | ICD-10-CM | POA: Diagnosis not present

## 2023-09-24 DIAGNOSIS — G9340 Encephalopathy, unspecified: Secondary | ICD-10-CM | POA: Diagnosis not present

## 2023-09-24 LAB — ECHOCARDIOGRAM COMPLETE
Area-P 1/2: 2.62 cm2
Height: 65 in
S' Lateral: 2.9 cm
Weight: 1777.79 [oz_av]

## 2023-09-24 LAB — BASIC METABOLIC PANEL
Anion gap: 12 (ref 5–15)
BUN: 21 mg/dL (ref 8–23)
CO2: 19 mmol/L — ABNORMAL LOW (ref 22–32)
Calcium: 10.6 mg/dL — ABNORMAL HIGH (ref 8.9–10.3)
Chloride: 106 mmol/L (ref 98–111)
Creatinine, Ser: 0.89 mg/dL (ref 0.44–1.00)
GFR, Estimated: >60 mL/min (ref >60)
Glucose, Bld: 124 mg/dL — ABNORMAL HIGH (ref 70–99)
Potassium: 3.6 mmol/L (ref 3.5–5.1)
Sodium: 137 mmol/L (ref 135–145)

## 2023-09-24 LAB — CBC
HCT: 43 % (ref 36.0–46.0)
Hemoglobin: 13.5 g/dL (ref 12.0–15.0)
MCH: 30.3 pg (ref 26.0–34.0)
MCHC: 31.4 g/dL (ref 30.0–36.0)
MCV: 96.6 fL (ref 80.0–100.0)
Platelets: 295 10*3/uL (ref 150–400)
RBC: 4.45 MIL/uL (ref 3.87–5.11)
RDW: 13.2 % (ref 11.5–15.5)
WBC: 9.7 10*3/uL (ref 4.0–10.5)
nRBC: 0 % (ref 0.0–0.2)

## 2023-09-24 MED ORDER — CEPHALEXIN 250 MG PO CAPS
250.0000 mg | ORAL_CAPSULE | Freq: Four times a day (QID) | ORAL | 0 refills | Status: AC
  Filled 2023-09-24: qty 4, 1d supply, fill #0

## 2023-09-24 MED ORDER — IOHEXOL 300 MG/ML  SOLN
100.0000 mL | Freq: Once | INTRAMUSCULAR | Status: AC | PRN
  Administered 2023-09-24: 75 mL via INTRAVENOUS

## 2023-09-24 NOTE — Discharge Summary (Signed)
Physician Discharge Summary  Heidi Blevins VWU:[id number] DOB: [birth year] DOA: 09/22/2023  PCP: Oneita Hurt, No  Admit date: 09/22/2023 Discharge date: 09/24/2023  Admitted From: Assisted living Disposition:  Same  Recommendations for Outpatient Follow-up:  Follow up with PCP in 1-2 weeks Follow up on imaging for L renal lesion  Discharge Condition:Stable  CODE STATUS:Full  Diet recommendation: As tolerated    Brief/Interim Summary: Heidi Blevins is a 73 y.o. female with medical history significant for hypertension, history of CVA, schizoaffective disorder, depression, anxiety, insomnia, and CKD 3A who presents with somnolence and confusion - found to have UTI - noted recently had foley catheter placed (removed 2 weeks prior to admit per family).  Patient admitted with acute encephalopathy secondary to UTI - recently place foley by urology had expired and ALF facility is unable to replace it - she had foley placed here but has since been removed with appropriate urine output. UTI now resolving - oral keflex in the interim to complete medication course. Updated son at bedside - otherwise stable and agreeable for discharge back to ALF.   Discharge Diagnoses:  Principal Problem:   Acute cystitis Active Problems:   History of CVA (cerebrovascular accident)   Acute encephalopathy   Schizoaffective disorder (HCC)   Essential hypertension   Chronic diastolic CHF (congestive heart failure) (HCC)   GAD (generalized anxiety disorder)   HLD (hyperlipidemia)   CKD stage 3a, GFR 45-59 ml/min (HCC)   Pericardial effusion   Kidney lesion, native, left  Severe sepsis secondary to UTI  -Sepsis criteria met in the setting of tachypnea, leukocytosis, and metabolic encephalopathy. -Continue keflex to complete 3 day course   Acute metabolic encephalopathy  Schizoaffective disorder, depression, anxiety   - Continue home medications including fluphenazine, lithium, Latuda, Zoloft, Ativan,  Cogentin  - Mental status back to baseline   Hx of CVA  -Continue Lipitor, Plavix     HTN -Continue lisinopril, metoprolol    CKD 3A -At baseline, follow repeat labs Pericardial effusion - Noted on CT in ED, no clinical tamponade, will check echo  Left renal lesion - Questionable left renal lesion noted on CT in ED, dedicated renal CT concerning for renal cell CA - unclear if patient appropriate for further work up/treatment/ or would benefit from palliative care.  Discharge Instructions  Discharge Instructions     Activity as tolerated - No restrictions   Complete by: As directed    Call MD for:  extreme fatigue   Complete by: As directed    Call MD for:  persistant dizziness or light-headedness   Complete by: As directed    Call MD for:  temperature >100.4   Complete by: As directed    Diet - low sodium heart healthy   Complete by: As directed    Discharge instructions   Complete by: As directed    Follow up with PCP as scheduled. Notable renal CT with vague, heterogeneous and enhancing lesion in the lower pole left kidney, worrisome for renal cell carcinoma. May benefit from oncology evaluation   Increase activity slowly   Complete by: As directed       Allergies as of 09/24/2023       Reactions   Sulfonamide Derivatives Hives            Medication List     TAKE these medications    acetaminophen 500 MG tablet Commonly known as: TYLENOL Take 1,000 mg by mouth in the morning and at bedtime.   atorvastatin 40  MG tablet Commonly known as: LIPITOR Take 1 tablet (40 mg total) by mouth daily. What changed: when to take this   benztropine 1 MG tablet Commonly known as: COGENTIN Take 1 mg by mouth 2 (two) times daily.   cephALEXin 250 MG capsule Commonly known as: KEFLEX Take 1 capsule (250 mg total) by mouth 4 (four) times daily for 1 day.   clopidogrel 75 MG tablet Commonly known as: PLAVIX Take 1 tablet (75 mg total) by mouth daily.   Cranberry 425  MG Caps Take 425 mg by mouth 2 (two) times a day.   FeroSul 325 (65 FE) MG tablet Generic drug: ferrous sulfate Take 325 mg by mouth daily with breakfast.   fluPHENAZine 10 MG tablet Commonly known as: PROLIXIN Take 20 mg by mouth in the morning and at bedtime.   hydrocortisone 2.5 % ointment Apply 1 Application topically daily as needed (dry, itchy skin).   ibuprofen 600 MG tablet Commonly known as: ADVIL Take 600 mg by mouth 2 (two) times daily as needed.   Latuda 120 MG Tabs Generic drug: Lurasidone HCl Take 120 mg by mouth daily.   lisinopril 20 MG tablet Commonly known as: ZESTRIL Take 20 mg by mouth daily.   lithium carbonate 150 MG capsule Take 1 capsule (150 mg total) by mouth at bedtime.   loratadine 10 MG tablet Commonly known as: CLARITIN Take 10 mg by mouth daily.   LORazepam 0.5 MG tablet Commonly known as: ATIVAN Take 0.5 mg by mouth in the morning, at noon, in the evening, and at bedtime.   melatonin 5 MG Tabs Take 5 mg by mouth at bedtime.   metoprolol tartrate 25 MG tablet Commonly known as: LOPRESSOR Take 25 mg by mouth 2 (two) times daily.   multivitamins ther. w/minerals Tabs tablet Take 1 tablet by mouth daily.   polyethylene glycol powder 17 GM/SCOOP powder Commonly known as: GLYCOLAX/MIRALAX Take 17 g by mouth daily.   sertraline 100 MG tablet Commonly known as: ZOLOFT Take 150 mg by mouth daily.   simethicone 80 MG chewable tablet Commonly known as: MYLICON Chew 1 tablet (80 mg total) by mouth 4 (four) times daily.   SYSTANE BALANCE OP Place 1 drop into both eyes 3 (three) times daily.   Vagisil Anti-Itch Medicated 1 % Misc Generic drug: Pramoxine HCl Apply vaginally once a day Apply 1 application topically daily as needed (for vaginal itching). Apply vaginally once a day What changed:  how much to take how to take this when to take this reasons to take this additional instructions        Allergies  Allergen  Reactions   Sulfonamide Derivatives Hives         Consultations: None   Procedures/Studies: CT RENAL ABD W/WO Result Date: 09/24/2023 CLINICAL DATA:  Indeterminate renal mass. EXAM: CT ABDOMEN WITHOUT AND WITH CONTRAST TECHNIQUE: Multidetector CT imaging of the abdomen was performed following the standard protocol before and following the bolus administration of intravenous contrast. RADIATION DOSE REDUCTION: This exam was performed according to the departmental dose-optimization program which includes automated exposure control, adjustment of the mA and/or kV according to patient size and/or use of iterative reconstruction technique. CONTRAST:  75mL OMNIPAQUE IOHEXOL 300 MG/ML  SOLN COMPARISON:  09/22/2023 and 03/27/2023. FINDINGS: Lower chest: No acute findings. Heart is enlarged. Small pericardial effusion. Atherosclerotic calcification of the aorta and coronary arteries. No pleural effusion. Distal esophagus is grossly unremarkable. Hepatobiliary: Liver is unremarkable. Tiny gallstones. Vicarious excretion of contrast in  the gallbladder. No biliary ductal dilatation. Pancreas: Negative. Spleen: Negative. Adrenals/Urinary Tract: Adrenal glands are unremarkable. Scarring in the kidneys bilaterally. Vague, heterogeneous and likely enhancing lesion in the lower pole left kidney measures approximately 1.9 x 2.2 cm (7/50). Kidneys are otherwise unremarkable. Stomach/Bowel: Duodenal diverticulum. Stomach and visualized portions of the small bowel and colon are otherwise unremarkable. Moderate stool burden. Vascular/Lymphatic: Atherosclerotic calcification of the aorta. No pathologically enlarged lymph nodes. Other: Mesenteries and peritoneum are unremarkable. Musculoskeletal: Degenerative changes in the spine. Single Harrington rod, partially visualized. Osteopenia. Degenerative changes in the spine. Age indeterminate mild lower thoracic compression fracture. IMPRESSION: 1. Vague, heterogeneous and enhancing  lesion in the lower pole left kidney, worrisome for renal cell carcinoma. 2. Small pericardial effusion. 3. Cholelithiasis. 4. Aortic atherosclerosis (ICD10-I70.0). Coronary artery calcification. Electronically Signed   By: Leanna Battles M.D.   On: 09/24/2023 11:39   CT ABDOMEN PELVIS W CONTRAST Result Date: 09/23/2023 CLINICAL DATA:  Acute abdominal pain EXAM: CT ABDOMEN AND PELVIS WITH CONTRAST TECHNIQUE: Multidetector CT imaging of the abdomen and pelvis was performed using the standard protocol following bolus administration of intravenous contrast. RADIATION DOSE REDUCTION: This exam was performed according to the departmental dose-optimization program which includes automated exposure control, adjustment of the mA and/or kV according to patient size and/or use of iterative reconstruction technique. CONTRAST:  75mL OMNIPAQUE IOHEXOL 300 MG/ML  SOLN COMPARISON:  CT abdomen and pelvis 03/27/2023 FINDINGS: Lower chest: Small to moderate-sized pericardial effusion is unchanged. The heart is enlarged, unchanged. Visualized lung bases are clear. Hepatobiliary: No focal liver abnormality is seen. A small gallstone is present. There is no bile duct dilatation. Pancreas: Unremarkable. No pancreatic ductal dilatation or surrounding inflammatory changes. Spleen: Normal in size without focal abnormality. Adrenals/Urinary Tract: There is limited evaluation of the kidney secondary to streak artifact. On delayed imaging, there is questionable focal solid-appearing lesion in the posterior lower pole of the left kidney measuring 2.6 x 3.0 cm image 20/23. Otherwise, the kidneys and adrenal glands are within normal limits. There is diffuse bladder wall thickening with submucosal enhancement and surrounding inflammatory chest stranding. Stomach/Bowel: Stomach is within normal limits. Appendix appears normal. No evidence of bowel wall thickening, distention, or inflammatory changes. There is large stool burden.  Vascular/Lymphatic: Aortic atherosclerosis. No enlarged abdominal or pelvic lymph nodes. Reproductive: Uterus and bilateral adnexa are unremarkable. Other: There is a small amount of free fluid in the pelvis. Musculoskeletal: Left hip arthroplasty is present. There is scoliosis of the thoracolumbar spine with fusion rod. There is a transverse fracture through the T10 vertebral body which appears subacute. IMPRESSION: 1. Diffuse bladder wall thickening with submucosal enhancement and surrounding inflammatory stranding compatible with cystitis. 2. Small amount of free fluid in the pelvis. 3. Cholelithiasis. 4. Stable small to moderate-sized pericardial effusion. 5. Subacute appearing transverse fracture through the T10 vertebral body. Please correlate clinically. 6. Aortic atherosclerosis. 7. On delayed imaging, there is questionable focal solid-appearing lesion in the posterior lower pole of the left kidney measuring 2.6 x 3.0 cm. Further evaluation recommended with dedicated renal CT. Aortic Atherosclerosis (ICD10-I70.0). Electronically Signed   By: Darliss Cheney M.D.   On: 09/23/2023 00:24   CT Head Wo Contrast Result Date: 09/23/2023 CLINICAL DATA:  Headache EXAM: CT HEAD WITHOUT CONTRAST TECHNIQUE: Contiguous axial images were obtained from the base of the skull through the vertex without intravenous contrast. RADIATION DOSE REDUCTION: This exam was performed according to the departmental dose-optimization program which includes automated exposure control, adjustment of the mA  and/or kV according to patient size and/or use of iterative reconstruction technique. COMPARISON:  Head CT 06/29/2023 FINDINGS: Brain: No evidence of acute infarction, hemorrhage, hydrocephalus, extra-axial collection or mass lesion/mass effect. Again seen is mild diffuse atrophy and mild patchy periventricular and deep white matter hypodensity. Vascular: Atherosclerotic calcifications are present within the cavernous internal carotid  arteries. Skull: Normal. Negative for fracture or focal lesion. Sinuses/Orbits: No acute finding. Other: None. IMPRESSION: 1. No acute intracranial process. 2. Mild diffuse atrophy and mild chronic small vessel ischemic changes. Electronically Signed   By: Darliss Cheney M.D.   On: 09/23/2023 00:16   DG Chest Portable 1 View Result Date: 09/22/2023 CLINICAL DATA:  Altered mental status EXAM: PORTABLE CHEST 1 VIEW COMPARISON:  Chest x-ray 06/29/2023 FINDINGS: Heart is mildly enlarged, unchanged. There is no pleural effusion or pneumothorax. Both lungs are clear. Scoliosis and fixation rods are again seen. IMPRESSION: No active disease. Mild cardiomegaly. Electronically Signed   By: Darliss Cheney M.D.   On: 09/22/2023 19:22     Subjective: No acute issues/events overnight   Discharge Exam: Vitals:   09/24/23 0655 09/24/23 1008  BP: (!) 152/94 (!) 157/106  Pulse: 81 67  Resp: 18 18  Temp: 98.3 F (36.8 C) 98.6 F (37 C)  SpO2: 98% 100%   Vitals:   09/23/23 2131 09/24/23 0118 09/24/23 0655 09/24/23 1008  BP: (!) 160/97 (!) 141/96 (!) 152/94 (!) 157/106  Pulse: 82 73 81 67  Resp: 18 18 18 18   Temp: (!) 97.5 F (36.4 C) 98 F (36.7 C) 98.3 F (36.8 C) 98.6 F (37 C)  TempSrc: Oral Oral Oral Oral  SpO2: 96% 96% 98% 100%  Weight:      Height:        General: Pt is alert, awake, not in acute distress Cardiovascular: RRR, S1/S2 +, no rubs, no gallops Respiratory: CTA bilaterally, no wheezing, no rhonchi Abdominal: Soft, NT, ND, bowel sounds + Extremities: no edema, no cyanosis    The results of significant diagnostics from this hospitalization (including imaging, microbiology, ancillary and laboratory) are listed below for reference.     Microbiology: Recent Results (from the past 240 hours)  Blood culture (routine x 2)     Status: None (Preliminary result)   Collection Time: 09/22/23  6:40 PM   Specimen: BLOOD  Result Value Ref Range Status   Specimen Description   Final     BLOOD RIGHT ANTECUBITAL Performed at [facility], [address] [place], [place] [address]    Special Requests   Final    Blood Culture adequate volume BOTTLES DRAWN AEROBIC AND ANAEROBIC Performed at [facility], [address], [place], [place] [address]    Culture   Final    NO GROWTH 1 DAY Performed at [facility], [address], [place], [place] [address]    Report Status PENDING  Incomplete  Resp panel by RT-PCR (RSV, Flu A&B, Covid) Anterior Nasal Swab     Status: None   Collection Time: 09/22/23  6:45 PM   Specimen: Anterior Nasal Swab  Result Value Ref Range Status   SARS Coronavirus 2 by RT PCR NEGATIVE NEGATIVE Final    Comment: (NOTE) SARS-CoV-2 target nucleic acids are NOT DETECTED.  The SARS-CoV-2 RNA is generally detectable in upper respiratory specimens during the acute phase of infection. The lowest concentration of SARS-CoV-2 viral copies this assay can detect is 138 copies/mL. A negative result does not preclude SARS-Cov-2 infection  and should not be used as the sole basis for treatment or other patient management decisions. A negative result may occur with  improper specimen collection/handling, submission of specimen other than nasopharyngeal swab, presence of viral mutation(s) within the areas targeted by this assay, and inadequate number of viral copies(<138 copies/mL). A negative result must be combined with clinical observations, patient history, and epidemiological information. The expected result is Negative.  Fact Sheet for Patients:  BloggerCourse.com  Fact Sheet for Healthcare Providers:  SeriousBroker.it  This test is no t yet approved or cleared by the [place] FDA and  has been authorized for detection and/or diagnosis of SARS-CoV-2 by FDA under an Emergency Use Authorization (EUA). This EUA will remain  in effect (meaning this test can  be used) for the duration of the COVID-19 declaration under Section 564(b)(1) of the Act, 21 U.S.C.section 360bbb-3(b)(1), unless the authorization is terminated  or revoked sooner.       Influenza A by PCR NEGATIVE NEGATIVE Final   Influenza B by PCR NEGATIVE NEGATIVE Final    Comment: (NOTE) The Xpert Xpress SARS-CoV-2/FLU/RSV plus assay is intended as an aid in the diagnosis of influenza from Nasopharyngeal swab specimens and should not be used as a sole basis for treatment. Nasal washings and aspirates are unacceptable for Xpert Xpress SARS-CoV-2/FLU/RSV testing.  Fact Sheet for Patients: BloggerCourse.com  Fact Sheet for Healthcare Providers: SeriousBroker.it  This test is not yet approved or cleared by the [place] FDA and has been authorized for detection and/or diagnosis of SARS-CoV-2 by FDA under an Emergency Use Authorization (EUA). This EUA will remain in effect (meaning this test can be used) for the duration of the COVID-19 declaration under Section 564(b)(1) of the Act, 21 U.S.C. section 360bbb-3(b)(1), unless the authorization is terminated or revoked.     Resp Syncytial Virus by PCR NEGATIVE NEGATIVE Final    Comment: (NOTE) Fact Sheet for Patients: BloggerCourse.com  Fact Sheet for Healthcare Providers: SeriousBroker.it  This test is not yet approved or cleared by the [place] FDA and has been authorized for detection and/or diagnosis of SARS-CoV-2 by FDA under an Emergency Use Authorization (EUA). This EUA will remain in effect (meaning this test can be used) for the duration of the COVID-19 declaration under Section 564(b)(1) of the Act, 21 U.S.C. section 360bbb-3(b)(1), unless the authorization is terminated or revoked.  Performed at [facility], [address], [place], [place] [address]   Urine Culture     Status:  Abnormal (Preliminary result)   Collection Time: 09/22/23 10:30 PM   Specimen: Urine, Random  Result Value Ref Range Status   Specimen Description   Final    URINE, RANDOM Performed at [facility], [address], [place], [place] [address]    Special Requests   Final    NONE Reflexed from [id number] Performed at [facility], [address], [place], [place] [address]    Culture >=100,000 COLONIES/mL GRAM NEGATIVE RODS (A)  Final   Report Status PENDING  Incomplete  Blood culture (routine x 2)     Status: None (Preliminary result)   Collection Time: 09/23/23  2:20 PM   Specimen: BLOOD LEFT HAND  Result Value Ref Range Status   Specimen Description   Final    BLOOD LEFT HAND Performed at [facility], [address] [place], [place] [address]    Special Requests   Final    BOTTLES DRAWN AEROBIC ONLY Blood Culture results  may not be optimal due to an inadequate volume of blood received in culture bottles Performed at [facility], [address], [place], [place] [address]    Culture   Final    NO GROWTH < 24 HOURS Performed at [facility], [address], [place], [place] [address]    Report Status PENDING  Incomplete     Labs: BNP (last 3 results) No results for input(s): "BNP" in the last 8760 hours. Basic Metabolic Panel: Recent Labs  Lab 09/22/23 1824 09/23/23 1420 09/24/23 0443  NA 137 137 137  K 3.5 3.8 3.6  CL 108 108 106  CO2 20* 16* 19*  GLUCOSE 104* 95 124*  BUN 21 19 21   CREATININE 1.05* 0.86 0.89  CALCIUM 11.0* 10.2 10.6*   Liver Function Tests: Recent Labs  Lab 09/22/23 1824  AST 38  ALT 37  ALKPHOS 115  BILITOT 0.7  PROT 8.3*  ALBUMIN 3.8   No results for input(s): "LIPASE", "AMYLASE" in the last 168 hours. No results for input(s): "AMMONIA" in the last 168 hours. CBC: Recent Labs  Lab 09/22/23 1824 09/23/23 1420 09/24/23 0443  WBC 16.2* 12.9* 9.7  NEUTROABS 12.5*   --   --   HGB 12.1 12.0 13.5  HCT 37.9 39.2 43.0  MCV 96.2 99.7 96.6  PLT 297 287 295   Cardiac Enzymes: No results for input(s): "CKTOTAL", "CKMB", "CKMBINDEX", "TROPONINI" in the last 168 hours. BNP: Invalid input(s): "POCBNP" CBG: Recent Labs  Lab 09/22/23 1949  GLUCAP 100*   D-Dimer No results for input(s): "DDIMER" in the last 72 hours. Hgb A1c No results for input(s): "HGBA1C" in the last 72 hours. Lipid Profile No results for input(s): "CHOL", "HDL", "LDLCALC", "TRIG", "CHOLHDL", "LDLDIRECT" in the last 72 hours. Thyroid function studies No results for input(s): "TSH", "T4TOTAL", "T3FREE", "THYROIDAB" in the last 72 hours.  Invalid input(s): "FREET3" Anemia work up No results for input(s): "VITAMINB12", "FOLATE", "FERRITIN", "TIBC", "IRON", "RETICCTPCT" in the last 72 hours. Urinalysis    Component Value Date/Time   COLORURINE YELLOW 09/22/2023 2230   APPEARANCEUR CLOUDY (A) 09/22/2023 2230   LABSPEC 1.016 09/22/2023 2230   PHURINE 7.0 09/22/2023 2230   GLUCOSEU NEGATIVE 09/22/2023 2230   HGBUR SMALL (A) 09/22/2023 2230   BILIRUBINUR NEGATIVE 09/22/2023 2230   KETONESUR NEGATIVE 09/22/2023 2230   PROTEINUR >=300 (A) 09/22/2023 2230   UROBILINOGEN 1.0 08/05/2011 1751   NITRITE NEGATIVE 09/22/2023 2230   LEUKOCYTESUR MODERATE (A) 09/22/2023 2230   Sepsis Labs Recent Labs  Lab 09/22/23 1824 09/23/23 1420 09/24/23 0443  WBC 16.2* 12.9* 9.7   Microbiology Recent Results (from the past 240 hours)  Blood culture (routine x 2)     Status: None (Preliminary result)   Collection Time: 09/22/23  6:40 PM   Specimen: BLOOD  Result Value Ref Range Status   Specimen Description   Final    BLOOD RIGHT ANTECUBITAL Performed at [facility], [address] [place], [place] [address]    Special Requests   Final    Blood Culture adequate volume BOTTLES DRAWN AEROBIC AND ANAEROBIC Performed at [facility], [address],  [place], [place] [address]    Culture   Final    NO GROWTH 1 DAY Performed at [facility], [address] [place], [place] [address]    Report Status PENDING  Incomplete  Resp panel by RT-PCR (RSV, Flu A&B, Covid) Anterior Nasal Swab  Status: None   Collection Time: 09/22/23  6:45 PM   Specimen: Anterior Nasal Swab  Result Value Ref Range Status   SARS Coronavirus 2 by RT PCR NEGATIVE NEGATIVE Final    Comment: (NOTE) SARS-CoV-2 target nucleic acids are NOT DETECTED.  The SARS-CoV-2 RNA is generally detectable in upper respiratory specimens during the acute phase of infection. The lowest concentration of SARS-CoV-2 viral copies this assay can detect is 138 copies/mL. A negative result does not preclude SARS-Cov-2 infection and should not be used as the sole basis for treatment or other patient management decisions. A negative result may occur with  improper specimen collection/handling, submission of specimen other than nasopharyngeal swab, presence of viral mutation(s) within the areas targeted by this assay, and inadequate number of viral copies(<138 copies/mL). A negative result must be combined with clinical observations, patient history, and epidemiological information. The expected result is Negative.  Fact Sheet for Patients:  BloggerCourse.com  Fact Sheet for Healthcare Providers:  SeriousBroker.it  This test is no t yet approved or cleared by the [place] FDA and  has been authorized for detection and/or diagnosis of SARS-CoV-2 by FDA under an Emergency Use Authorization (EUA). This EUA will remain  in effect (meaning this test can be used) for the duration of the COVID-19 declaration under Section 564(b)(1) of the Act, 21 U.S.C.section 360bbb-3(b)(1), unless the authorization is terminated  or revoked sooner.       Influenza A by PCR NEGATIVE NEGATIVE Final   Influenza B by PCR NEGATIVE NEGATIVE Final     Comment: (NOTE) The Xpert Xpress SARS-CoV-2/FLU/RSV plus assay is intended as an aid in the diagnosis of influenza from Nasopharyngeal swab specimens and should not be used as a sole basis for treatment. Nasal washings and aspirates are unacceptable for Xpert Xpress SARS-CoV-2/FLU/RSV testing.  Fact Sheet for Patients: BloggerCourse.com  Fact Sheet for Healthcare Providers: SeriousBroker.it  This test is not yet approved or cleared by the [place] FDA and has been authorized for detection and/or diagnosis of SARS-CoV-2 by FDA under an Emergency Use Authorization (EUA). This EUA will remain in effect (meaning this test can be used) for the duration of the COVID-19 declaration under Section 564(b)(1) of the Act, 21 U.S.C. section 360bbb-3(b)(1), unless the authorization is terminated or revoked.     Resp Syncytial Virus by PCR NEGATIVE NEGATIVE Final    Comment: (NOTE) Fact Sheet for Patients: BloggerCourse.com  Fact Sheet for Healthcare Providers: SeriousBroker.it  This test is not yet approved or cleared by the [place] FDA and has been authorized for detection and/or diagnosis of SARS-CoV-2 by FDA under an Emergency Use Authorization (EUA). This EUA will remain in effect (meaning this test can be used) for the duration of the COVID-19 declaration under Section 564(b)(1) of the Act, 21 U.S.C. section 360bbb-3(b)(1), unless the authorization is terminated or revoked.  Performed at [facility], [address], [place], [place] [address]   Urine Culture     Status: Abnormal (Preliminary result)   Collection Time: 09/22/23 10:30 PM   Specimen: Urine, Random  Result Value Ref Range Status   Specimen Description   Final    URINE, RANDOM Performed at [facility], [address], [place], [place] [address]    Special Requests    Final    NONE Reflexed from [id number] Performed at [facility], [address], [place], [place] [address]    Culture >=100,000 COLONIES/mL GRAM NEGATIVE RODS (A)  Final  Report Status PENDING  Incomplete  Blood culture (routine x 2)     Status: None (Preliminary result)   Collection Time: 09/23/23  2:20 PM   Specimen: BLOOD LEFT HAND  Result Value Ref Range Status   Specimen Description   Final    BLOOD LEFT HAND Performed at [facility], [address] [place], [place] [address]    Special Requests   Final    BOTTLES DRAWN AEROBIC ONLY Blood Culture results may not be optimal due to an inadequate volume of blood received in culture bottles Performed at [facility], [address], [place], [place] [address]    Culture   Final    NO GROWTH < 24 HOURS Performed at [facility], [address], [place], [place] [address]    Report Status PENDING  Incomplete     Time coordinating discharge: Over 30 minutes  SIGNED:   Azucena Fallen, DO [facility] 09/24/2023, 12:03 PM Pager   If 7PM-7AM, please contact night-coverage [email or web address]

## 2023-09-24 NOTE — Plan of Care (Signed)
  Problem: Health Behavior/Discharge Planning: Goal: Ability to manage health-related needs will improve Outcome: Not Applicable   Problem: Activity: Goal: Risk for activity intolerance will decrease Outcome: Not Applicable

## 2023-09-24 NOTE — [place] FL2 (Signed)
[place] MEDICAID FL2 LEVEL OF CARE FORM     IDENTIFICATION  Patient Name: Heidi Blevins Birthdate: Jan 24, 1951 Sex: female Admission Date (Current Location): 09/22/2023  [facility] and [place] Number:  Producer, television/film/video and Address:  [facility],  [address] [place], [place] [address]      Provider Number: [id number]  Attending Physician Name and Address:  Azucena Fallen, MD  Relative Name and Phone Number:  [place], Fetterly)  [phone] [facility])    Current Level of Care: Hospital Recommended Level of Care: Assisted Living Facility (Memory Care Unit) Prior Approval Number:    Date Approved/Denied:   PASRR Number:    Discharge Plan: Home    Current Diagnoses: Patient Active Problem List   Diagnosis Date Noted   CKD stage 3a, GFR 45-59 ml/min (HCC) 09/23/2023   Pericardial effusion 09/23/2023   Kidney lesion, native, left 09/23/2023   Acute cystitis 04/23/2022   Chronic diastolic CHF (congestive heart failure) (HCC) 04/23/2022   GAD (generalized anxiety disorder) 04/23/2022   HLD (hyperlipidemia) 04/23/2022   Polypharmacy 08/09/2020   Acute CVA (cerebrovascular accident) (HCC) 08/09/2020   Thyroid nodule 08/09/2020   History of CVA (cerebrovascular accident) 08/08/2020   Bradycardia    Acute metabolic encephalopathy    Sepsis secondary to UTI (HCC) 03/20/2019   Closed displaced fracture of left femoral neck (HCC) 09/26/2018   AKI (acute kidney injury) (HCC) 06/13/2016   UTI (urinary tract infection) 06/13/2016   Essential hypertension 06/13/2016   Urinary tract infection without hematuria    Complicated UTI (urinary tract infection) 05/03/2016   Hypercalcemia 05/03/2016   Dehydration 05/03/2016   Acute encephalopathy 05/03/2016   Cerebrovascular disease 05/03/2016   Schizoaffective disorder (HCC) 05/03/2016   Bifascicular block 05/03/2016    Orientation RESPIRATION BLADDER Height & Weight     Self  Normal Incontinent, External  catheter Weight: 50.4 kg Height:  5\' 5"  (165.1 cm)  BEHAVIORAL SYMPTOMS/MOOD NEUROLOGICAL BOWEL NUTRITION STATUS      Continent Diet  AMBULATORY STATUS COMMUNICATION OF NEEDS Skin   Limited Assist Verbally Normal                       Personal Care Assistance Level of Assistance  Bathing, Feeding, Dressing Bathing Assistance: Limited assistance Feeding assistance: Limited assistance Dressing Assistance: Limited assistance     Functional Limitations Info  Sight, Hearing, Speech Sight Info: Impaired Hearing Info: Adequate Speech Info: Adequate    SPECIAL CARE FACTORS FREQUENCY                       Contractures Contractures Info: Not present    Additional Factors Info  Code Status, Allergies, Psychotropic Code Status Info: Full Code Allergies Info: Sulfonamide Derivatives Psychotropic Info: Zoloft 150mg  po daily         Current Medications (09/24/2023):  This is the current hospital active medication list Current Facility-Administered Medications  Medication Dose Route Frequency Provider Last Rate Last Admin   acetaminophen (TYLENOL) tablet 650 mg  650 mg Oral Q6H PRN Opyd, Lavone Neri, MD   650 mg at 09/24/23 5621   Or   acetaminophen (TYLENOL) suppository 650 mg  650 mg Rectal Q6H PRN Opyd, Lavone Neri, MD       atorvastatin (LIPITOR) tablet 40 mg  40 mg Oral QHS Opyd, Lavone Neri, MD   40 mg at 09/23/23 2218   benztropine (COGENTIN) tablet 1 mg  1 mg Oral BID Opyd, Lavone Neri, MD  1 mg at 09/24/23 0843   cefTRIAXone (ROCEPHIN) 1 g in sodium chloride 0.9 % 100 mL IVPB  1 g Intravenous Q24H Opyd, Lavone Neri, MD 200 mL/hr at 09/24/23 0003 1 g at 09/24/23 0003   clopidogrel (PLAVIX) tablet 75 mg  75 mg Oral Daily Opyd, Lavone Neri, MD   75 mg at 09/24/23 0818   enoxaparin (LOVENOX) injection 40 mg  40 mg Subcutaneous Q24H Opyd, Lavone Neri, MD   40 mg at 09/24/23 0818   fluPHENAZine (PROLIXIN) tablet 20 mg  20 mg Oral BID Opyd, Lavone Neri, MD   20 mg at 09/24/23 0818    lisinopril (ZESTRIL) tablet 20 mg  20 mg Oral Daily Opyd, Lavone Neri, MD   20 mg at 09/24/23 0454   lithium carbonate capsule 150 mg  150 mg Oral QHS Opyd, Lavone Neri, MD   150 mg at 09/23/23 2218   LORazepam (ATIVAN) tablet 0.5 mg  0.5 mg Oral QID Briscoe Deutscher, MD   0.5 mg at 09/24/23 0818   lurasidone (LATUDA) tablet 120 mg  120 mg Oral Daily Opyd, Lavone Neri, MD   120 mg at 09/23/23 1042   melatonin tablet 5 mg  5 mg Oral QHS Opyd, Lavone Neri, MD   5 mg at 09/23/23 2218   metoprolol tartrate (LOPRESSOR) tablet 25 mg  25 mg Oral BID Opyd, Lavone Neri, MD   25 mg at 09/24/23 0818   nicotine (NICODERM CQ - dosed in mg/24 hours) patch 21 mg  21 mg Transdermal Daily Azucena Fallen, MD   21 mg at 09/24/23 0981   oxyCODONE (Oxy IR/ROXICODONE) immediate release tablet 5 mg  5 mg Oral Q4H PRN Opyd, Lavone Neri, MD       polyethylene glycol (MIRALAX / GLYCOLAX) packet 17 g  17 g Oral Daily PRN Opyd, Lavone Neri, MD       prochlorperazine (COMPAZINE) injection 5 mg  5 mg Intravenous Q6H PRN Opyd, Lavone Neri, MD       sertraline (ZOLOFT) tablet 150 mg  150 mg Oral Daily Opyd, Lavone Neri, MD   150 mg at 09/24/23 1914     Discharge Medications: Please see discharge summary for a list of discharge medications.  Relevant Imaging Results:  Relevant Lab Results:   Additional Information SSN: [id number]  Howell Rucks, RN

## 2023-09-24 NOTE — Progress Notes (Signed)
Attempted to call report to [facility] [phone] but no one available to take report .

## 2023-09-24 NOTE — Plan of Care (Signed)
Problem: Health Behavior/Discharge Planning: Goal: Ability to manage health-related needs will improve Outcome: Progressing   Problem: Skin Integrity: Goal: Risk for impaired skin integrity will decrease Outcome: Progressing

## 2023-09-25 LAB — URINE CULTURE: Culture: >=100000 — AB

## 2023-09-28 LAB — CULTURE, BLOOD (ROUTINE X 2)
Culture: NO GROWTH
Culture: NO GROWTH
Special Requests: ADEQUATE

## 2023-10-04 ENCOUNTER — Other Ambulatory Visit (HOSPITAL_COMMUNITY): Payer: Self-pay

## 2023-10-25 ENCOUNTER — Encounter: Payer: Self-pay | Admitting: Medical Oncology

## 2023-10-25 ENCOUNTER — Encounter: Payer: Self-pay | Admitting: Nurse Practitioner

## 2023-10-26 ENCOUNTER — Inpatient Hospital Stay: Payer: 59 | Attending: Physician Assistant | Admitting: Physician Assistant

## 2023-10-26 ENCOUNTER — Encounter: Payer: Self-pay | Admitting: Medical Oncology

## 2023-10-26 ENCOUNTER — Inpatient Hospital Stay (HOSPITAL_BASED_OUTPATIENT_CLINIC_OR_DEPARTMENT_OTHER): Payer: 59 | Admitting: Physician Assistant

## 2023-10-26 ENCOUNTER — Encounter: Payer: Self-pay | Admitting: Physician Assistant

## 2023-10-26 VITALS — BP 120/69 | HR 77 | Temp 97.3°F | Resp 16 | Wt 117.3 lb

## 2023-10-26 DIAGNOSIS — I3139 Other pericardial effusion (noninflammatory): Secondary | ICD-10-CM | POA: Diagnosis not present

## 2023-10-26 DIAGNOSIS — K8020 Calculus of gallbladder without cholecystitis without obstruction: Secondary | ICD-10-CM | POA: Insufficient documentation

## 2023-10-26 DIAGNOSIS — I2510 Atherosclerotic heart disease of native coronary artery without angina pectoris: Secondary | ICD-10-CM | POA: Diagnosis not present

## 2023-10-26 DIAGNOSIS — D649 Anemia, unspecified: Secondary | ICD-10-CM | POA: Insufficient documentation

## 2023-10-26 DIAGNOSIS — N2889 Other specified disorders of kidney and ureter: Secondary | ICD-10-CM | POA: Insufficient documentation

## 2023-10-26 DIAGNOSIS — I700 Atherosclerosis of aorta: Secondary | ICD-10-CM | POA: Diagnosis not present

## 2023-10-26 DIAGNOSIS — Z79899 Other long term (current) drug therapy: Secondary | ICD-10-CM | POA: Diagnosis not present

## 2023-10-26 LAB — CBC WITH DIFFERENTIAL (CANCER CENTER ONLY)
Abs Immature Granulocytes: 0.37 10*3/uL — ABNORMAL HIGH (ref 0.00–0.07)
Basophils Absolute: 0.1 10*3/uL (ref 0.0–0.1)
Basophils Relative: 0 %
Eosinophils Absolute: 0.2 10*3/uL (ref 0.0–0.5)
Eosinophils Relative: 1 %
HCT: 25.2 % — ABNORMAL LOW (ref 36.0–46.0)
Hemoglobin: 7.9 g/dL — ABNORMAL LOW (ref 12.0–15.0)
Immature Granulocytes: 2 %
Lymphocytes Relative: 18 %
Lymphs Abs: 2.9 10*3/uL (ref 0.7–4.0)
MCH: 30 pg (ref 26.0–34.0)
MCHC: 31.3 g/dL (ref 30.0–36.0)
MCV: 95.8 fL (ref 80.0–100.0)
Monocytes Absolute: 1.2 10*3/uL — ABNORMAL HIGH (ref 0.1–1.0)
Monocytes Relative: 7 %
Neutro Abs: 11.6 10*3/uL — ABNORMAL HIGH (ref 1.7–7.7)
Neutrophils Relative %: 72 %
Platelet Count: 619 10*3/uL — ABNORMAL HIGH (ref 150–400)
RBC: 2.63 MIL/uL — ABNORMAL LOW (ref 3.87–5.11)
RDW: 14.5 % (ref 11.5–15.5)
WBC Count: 16.3 10*3/uL — ABNORMAL HIGH (ref 4.0–10.5)
nRBC: 0 % (ref 0.0–0.2)

## 2023-10-26 LAB — CMP (CANCER CENTER ONLY)
ALT: 81 U/L — ABNORMAL HIGH (ref 0–44)
AST: 55 U/L — ABNORMAL HIGH (ref 15–41)
Albumin: 3.3 g/dL — ABNORMAL LOW (ref 3.5–5.0)
Alkaline Phosphatase: 162 U/L — ABNORMAL HIGH (ref 38–126)
Anion gap: 3 — ABNORMAL LOW (ref 5–15)
BUN: 16 mg/dL (ref 8–23)
CO2: 22 mmol/L (ref 22–32)
Calcium: 9.7 mg/dL (ref 8.9–10.3)
Chloride: 110 mmol/L (ref 98–111)
Creatinine: 1.03 mg/dL — ABNORMAL HIGH (ref 0.44–1.00)
GFR, Estimated: 58 mL/min — ABNORMAL LOW (ref >60)
Glucose, Bld: 87 mg/dL (ref 70–99)
Potassium: 4.3 mmol/L (ref 3.5–5.1)
Sodium: 135 mmol/L (ref 135–145)
Total Bilirubin: 0.4 mg/dL (ref 0.0–1.2)
Total Protein: 7.5 g/dL (ref 6.5–8.1)

## 2023-10-26 NOTE — Progress Notes (Signed)
 Rapid Diagnostic Clinic  Patient in clinic for scheduled appointment with Georga Kaufmann, PA-C. Patient present to appointment with her RCC, Judeth Cornfield, from [facility], where patient resides. I introduced myself and provided Lurena Joiner with my direct contact information. They both were encouraged to call me with questions/concerns.   Rexene Edison, RN, BSN, [facility] Oncology Nurse Navigator, Rapid Diagnostic Clinic 10/26/2023 2:51 PM

## 2023-10-26 NOTE — Progress Notes (Signed)
 Rapid Diagnostic Clinic [facility] [phone]   [phone]  INITIAL CONSULTATION:  Patient Care Team: Pcp, No as PCP - General  CHIEF COMPLAINTS/PURPOSE OF CONSULTATION:  Left renal mass  HISTORY OF PRESENTING ILLNESS:  Jacinto Halim 73 y.o. female with medical history significant for hypertension, history of CVA, schizoaffective disorder, depression, anxiety, insomnia, and CKD presents to the rapid diagnostic clinic for evaluation of left renal mass. She is accompanied by a caretaker for this visit.   On review of the previous records, Ms. Fellows was recently admitted from 09/22/2023-09/24/2023 for acute encephalopathy secondary to UTI. CT imaging revealed a heterogenous and enhancing lesion int he lower pole left kidney measuring 1.9 x 2.2 cm worrisome for renal cell carcinoma.   On exam today, Ms. Hartmann currently resides in a assisted living facility. She has very low energy levels and is in bed more than 50% of the day. She lost quite a bit of weight (approximately 35-40 lbs) since November 2024 as she was not getting out of bed to eat. Her weight has improved since the assisted facility brings food to her bedside. In addition, she is supplementing her diet with protein shakes 3x per day. She denies nausea, vomiting or bowel habit changes. She denies easy bruising or signs of bleeding. She still struggles with urination with urgency but only voiding 75% of the time. She refuses a foley catheter.  She denies fevers, chills, sweats, shortness of breath, chest pain or cough. She has no other complaints. Rest of the 10 point ROS is below   MEDICAL HISTORY:  Past Medical History:  Diagnosis Date   Anxiety    Arrhythmia    Bifascicular block    Chronic diastolic heart failure (HCC)    Depression    Hypertension    Insomnia    Schizoaffective disorder    Scoliosis    Thyroid nodule 08/09/2020   Vitamin D deficiency     SURGICAL  HISTORY: Past Surgical History:  Procedure Laterality Date   BACK SURGERY     "as a child after she fell out of a tree"   TONSILLECTOMY     TOTAL HIP ARTHROPLASTY Left 09/27/2018   Procedure: LEFT TOTAL HIP ARTHROPLASTY ANTERIOR APPROACH;  Surgeon: Tarry Kos, MD;  Location: [facility] OR;  Service: Orthopedics;  Laterality: Left;    SOCIAL HISTORY: Social History   Socioeconomic History   Marital status: Divorced    Spouse name: Not on file   Number of children: Not on file   Years of education: Not on file   Highest education level: Not on file  Occupational History   Occupation: Unemployed  Tobacco Use   Smoking status: Every Day    Current packs/day: 0.25    Average packs/day: 0.3 packs/day for 40.0 years (10.0 ttl pk-yrs)    Types: Cigarettes   Smokeless tobacco: Never  Substance and Sexual Activity   Alcohol use: No   Drug use: No   Sexual activity: Never  Other Topics Concern   Not on file  Social History Narrative   Lives in an ALF.  Normally independent of ADLs and with ambulation.   Social Drivers of Corporate investment banker Strain: Not on file  Food Insecurity: Patient Unable To Answer (09/23/2023)   Hunger Vital Sign    Worried About Running Out of Food in the Last Year: Patient unable to answer    Ran Out of Food in the Last Year: Patient unable to answer  Transportation Needs: Patient Unable To Answer (09/23/2023)   PRAPARE - Transportation    Lack of Transportation (Medical): Patient unable to answer    Lack of Transportation (Non-Medical): Patient unable to answer  Physical Activity: Not on file  Stress: Not on file  Social Connections: Unknown (09/23/2023)   Social Connection and Isolation Panel [NHANES]    Frequency of Communication with Friends and Family: Patient unable to answer    Frequency of Social Gatherings with Friends and Family: Patient unable to answer    Attends Religious Services: Patient unable to answer    Active Member of Clubs or  Organizations: Patient unable to answer    Attends Banker Meetings: Patient unable to answer    Marital Status: Widowed  Intimate Partner Violence: Patient Unable To Answer (09/23/2023)   Humiliation, Afraid, Rape, and Kick questionnaire    Fear of Current or Ex-Partner: Patient unable to answer    Emotionally Abused: Patient unable to answer    Physically Abused: Patient unable to answer    Sexually Abused: Patient unable to answer    FAMILY HISTORY: Family History  Problem Relation Age of Onset   CVA Mother    Hypertension Mother    Lupus Sister    Hypertension Brother    Peptic Ulcer Disease Father    Colon cancer Neg Hx     ALLERGIES:  is allergic to sulfonamide derivatives.  MEDICATIONS:  Current Outpatient Medications  Medication Sig Dispense Refill   acetaminophen (TYLENOL) 500 MG tablet Take 1,000 mg by mouth in the morning and at bedtime.     atorvastatin (LIPITOR) 40 MG tablet Take 1 tablet (40 mg total) by mouth daily. (Patient taking differently: Take 40 mg by mouth at bedtime.) 30 tablet 0   benztropine (COGENTIN) 1 MG tablet Take 1 mg by mouth 2 (two) times daily.     clopidogrel (PLAVIX) 75 MG tablet Take 1 tablet (75 mg total) by mouth daily. 17 tablet 0   Cranberry 425 MG CAPS Take 425 mg by mouth 2 (two) times a day.     ferrous sulfate (FEROSUL) 325 (65 FE) MG tablet Take 325 mg by mouth daily with breakfast.     fluPHENAZine (PROLIXIN) 10 MG tablet Take 20 mg by mouth in the morning and at bedtime.     hydrocortisone 2.5 % ointment Apply 1 Application topically daily as needed (dry, itchy skin).     ibuprofen (ADVIL) 600 MG tablet Take 600 mg by mouth 2 (two) times daily as needed.     lisinopril (ZESTRIL) 20 MG tablet Take 20 mg by mouth daily.     loratadine (CLARITIN) 10 MG tablet Take 10 mg by mouth daily.     LORazepam (ATIVAN) 0.5 MG tablet Take 0.5 mg by mouth in the morning, at noon, in the evening, and at bedtime.     Lurasidone HCl  (LATUDA) 120 MG TABS Take 120 mg by mouth daily.     melatonin 5 MG TABS Take 5 mg by mouth at bedtime.     metoprolol tartrate (LOPRESSOR) 25 MG tablet Take 25 mg by mouth 2 (two) times daily.     Multiple Vitamins-Minerals (MULTIVITAMINS THER. W/MINERALS) TABS tablet Take 1 tablet by mouth daily.     polyethylene glycol powder (GLYCOLAX/MIRALAX) 17 GM/SCOOP powder Take 17 g by mouth daily.     Pramoxine HCl (VAGISIL ANTI-ITCH MEDICATED) 1 % MISC Apply vaginally once a day Apply 1 application topically daily as needed (for vaginal itching).  Apply vaginally once a day (Patient taking differently: Apply 1 application  topically daily as needed (for vaginal itching).) 20 each 0   Propylene Glycol (SYSTANE BALANCE OP) Place 1 drop into both eyes 3 (three) times daily.     sertraline (ZOLOFT) 100 MG tablet Take 150 mg by mouth daily.     simethicone (MYLICON) 80 MG chewable tablet Chew 1 tablet (80 mg total) by mouth 4 (four) times daily. 30 tablet 0   lithium carbonate 150 MG capsule Take 1 capsule (150 mg total) by mouth at bedtime. 30 capsule 0   No current facility-administered medications for this visit.    REVIEW OF SYSTEMS:   Constitutional: ( - ) fevers, ( - )  chills , ( - ) night sweats Eyes: ( - ) blurriness of vision, ( - ) double vision, ( - ) watery eyes Ears, nose, mouth, throat, and face: ( - ) mucositis, ( - ) sore throat Respiratory: ( - ) cough, ( - ) dyspnea, ( - ) wheezes Cardiovascular: ( - ) palpitation, ( - ) chest discomfort, ( - ) lower extremity swelling Gastrointestinal:  ( - ) nausea, ( - ) heartburn, ( - ) change in bowel habits Skin: ( - ) abnormal skin rashes Lymphatics: ( - ) new lymphadenopathy, ( - ) easy bruising Neurological: ( - ) numbness, ( - ) tingling, ( - ) new weaknesses Behavioral/Psych: ( - ) mood change, ( - ) new changes  All other systems were reviewed with the patient and are negative.  PHYSICAL EXAMINATION: ECOG PERFORMANCE STATUS: 3 -  Symptomatic, >50% confined to bed  Vitals:   10/26/23 1359  BP: 120/69  Pulse: 77  Resp: 16  Temp: (!) 97.3 F (36.3 C)  SpO2: 99%   Filed Weights   10/26/23 1359  Weight: 117 lb 4.8 oz (53.2 kg)    GENERAL: chronically ill, elderly female in NAD  SKIN: skin color, texture, turgor are normal, no rashes or significant lesions EYES: conjunctiva are pink and non-injected, sclera clear LUNGS: clear to auscultation and percussion with normal breathing effort HEART: regular rate & rhythm and no murmurs and no lower extremity edema Musculoskeletal: no cyanosis of digits and no clubbing  PSYCH: alert & oriented x 3, fluent speech NEURO: no focal motor/sensory deficits  LABORATORY DATA:  I have reviewed the data as listed    Latest Ref Rng & Units 10/26/2023    2:48 PM 09/24/2023    4:43 AM 09/23/2023    2:20 PM  CBC  WBC 4.0 - 10.5 K/uL 16.3  9.7  12.9   Hemoglobin 12.0 - 15.0 g/dL 7.9  30.8  65.7   Hematocrit 36.0 - 46.0 % 25.2  43.0  39.2   Platelets 150 - 400 K/uL 619  295  287        Latest Ref Rng & Units 09/24/2023    4:43 AM 09/23/2023    2:20 PM 09/22/2023    6:24 PM  CMP  Glucose 70 - 99 mg/dL 846  95  962   BUN 8 - 23 mg/dL 21  19  21    Creatinine 0.44 - 1.00 mg/dL 9.52  8.41  3.24   Sodium 135 - 145 mmol/L 137  137  137   Potassium 3.5 - 5.1 mmol/L 3.6  3.8  3.5   Chloride 98 - 111 mmol/L 106  108  108   CO2 22 - 32 mmol/L 19  16  20    Calcium  8.9 - 10.3 mg/dL 56.2  13.0  86.5   Total Protein 6.5 - 8.1 g/dL   8.3   Total Bilirubin 0.0 - 1.2 mg/dL   0.7   Alkaline Phos 38 - 126 U/L   115   AST 15 - 41 U/L   38   ALT 0 - 44 U/L   37      RADIOGRAPHIC STUDIES:  CT renal abdomen on 09/24/2023: IMPRESSION: 1. Vague, heterogeneous and enhancing lesion in the lower pole left kidney, worrisome for renal cell carcinoma. 2. Small pericardial effusion. 3. Cholelithiasis. 4. Aortic atherosclerosis (ICD10-I70.0). Coronary artery calcification.  ASSESSMENT &  PLAN AALAYAH RILES is a 73 y.o. female who presents to the rapid diagnostic clinic for evaluation of left renal mass worrisome for renal cell carcinoma.   #Left renal mass:  --Reviewed the CT scan from 09/24/2023 in detail that shows a 1.9 x 2.2 cm left renal mass without evidence of metastatic disease. --Need a referral to urology is needed to surgical evaluation as mainstay treatment for RCC includes nephrectomy. Ms. Patane has established care with urologist, Dr. Jeralyn Bennett at [facility] last seen on 07/09/2023. --If patient is not a candidate for surgery, we will consider local treatment options including radiation therapy, ablation or embolization.  --Labs today to check baseline CBC and CMP.  --RTC once workup is complete.   **ADDENDUM: --Patient's CBC from today show acute anemia with Hgb 7.9, thrombocytosis with platelet count of 619K, WBC 16.3. Due to worsening fatigue, recommend 1 unit of blood and we will obtain additional labs to further evaluate abnormal blood counts including iron panel.   Orders Placed This Encounter  Procedures   CBC with Differential (Cancer Center Only)    Standing Status:   Future    Number of Occurrences:   1    Expiration Date:   10/25/2024   CMP (Cancer Center only)    Standing Status:   Future    Number of Occurrences:   1    Expiration Date:   10/25/2024    All questions were answered. The patient knows to call the clinic with any problems, questions or concerns.  I have spent a total of 60 minutes minutes of face-to-face and non-face-to-face time, preparing to see the patient, obtaining and/or reviewing separately obtained history, performing a medically appropriate examination, counseling and educating the patient, ordering medications/tests/procedures, referring and communicating with other health care professionals, documenting clinical information in the electronic health record, independently interpreting results and  communicating results to the patient, and care coordination.   Georga Kaufmann, PA-C Department of Hematology/Oncology [facility] at [facility] Phone: [phone]  Patient was seen with Dr. Leonides Schanz  I have read the above note and personally examined the patient. I agree with the assessment and plan as noted above.  Briefly Mrs. Westerfeld is a 73 year old female who presents for evaluation of a kidney mass.  The patient underwent a CT of her abdomen on 09/22/2023 due to abdominal pain.  During that scan she was found to have a left kidney lesion measuring 2.6 x 3.0 cm.  A dedicated CT renal abdomen was performed on 09/24/2023.  At this time findings are concerning for a localized renal cell carcinoma.  Will make referral to urology for consideration of resection versus ablative intervention.  The patient may not be an ideal surgical candidate.  After the patient left today her labs also showed an acute onset of severe anemia.  Will order  nutritional studies, sample blood bank, and will contact her gastroenterologist to make him aware of the rapidly developing anemia.  Patient voiced understanding of our findings and plan moving forward.   Ulysees Barns, MD Department of Hematology/Oncology [facility] at [facility] Phone: [phone] Pager: [phone] Email: [email or web address]

## 2023-10-26 NOTE — Patient Instructions (Signed)
 Diagnostic Clinic Office Visit Discharge Information and Instructions  Thank you for choosing [facility] for your healthcare needs.  Below is a summary of today's discussion, along with our contact information and an outline of what to expect next.  Reason for Visit:  Left renal mass  Proposed Diagnostic Care Plan: Labs today Referral to urology to evaluate for surgical resection.    What to Expect: - Generally, when lab tests are ordered the results can take up to 1 week for results to be available.  At that point, we will contact you to discuss your results with you.  Unless there is a critical result, we will typically wait for all of your lab results to be available before contacting you. - If a biopsy is part of your Care Plan, those results can take on average 7-10 days to result.  Once results are available, we will contact you to discuss your pathology results and any next steps. - If you have additional imaging ordered, such as a CT Scan, MRI, Ultrasound, Bone Scan, or PET scan, your imaging will need to be authorized then scheduled with the earliest available appointment.  You may be asked to travel to another hospital within [facility] who has a sooner availability, please consider doing so if asked. - If you use MyChart, your results will be available to you in the MyChart portal.  Your provider will be in touch with you as soon as all of your results are available to be discussed.  Your Diagnostic Clinic Provider:  Georga Kaufmann PA-C and Dr. Jeanie Sewer, contact number [phone] Your Diagnostic Navigator:  Chauncy Lean, RN, contact number [phone]  If you or your caregiver have number blocking on your cell phones, please ensure the cancer center's numbers are not blocked.  If you are not a registered MyChart user, please consider enrolling in MyChart to receive your test results and visit notes.  You can also access your discharge instructions electronically.  MyChart  also gives you an electronic means to communicate with your Care Team instead of needing to call in to the cancer center.  We appreciate you trusting [place] with your healthcare and look forward to partnering with you as we work to uncover what your potential diagnosis may be.  Please do not hesitate to reach out at any point with questions or concerns.

## 2023-10-29 ENCOUNTER — Encounter: Payer: Self-pay | Admitting: Medical Oncology

## 2023-10-29 ENCOUNTER — Telehealth: Payer: Self-pay | Admitting: Medical Oncology

## 2023-10-29 NOTE — Progress Notes (Signed)
 [facility]  Outgoing call to [facility]. Spoke with scheduling department for patient to be seen by Dr. Jennette Bill. Patient seen in the past by Dr. Jennette Bill, per PA-C Rolland Porter request, [facility] contact and asked for patient to be scheduled regarding renal mass. Recent office notes from 10/26/2023 as well as imaging results faxed to their office.   Patient resides at [facility] and scheduler with [facility] was provided with their contact number as well as the facilities scheduler's name, Laurey Arrow.   Gregary Cromer, RN, BSN, [facility] Oncology Nurse Navigator, [facility] 10/29/2023 10:42 AM

## 2023-10-30 ENCOUNTER — Inpatient Hospital Stay: Attending: Physician Assistant

## 2023-10-30 ENCOUNTER — Other Ambulatory Visit: Payer: Self-pay

## 2023-10-30 DIAGNOSIS — N1831 Chronic kidney disease, stage 3a: Secondary | ICD-10-CM | POA: Insufficient documentation

## 2023-10-30 DIAGNOSIS — N2889 Other specified disorders of kidney and ureter: Secondary | ICD-10-CM | POA: Diagnosis present

## 2023-10-30 DIAGNOSIS — D649 Anemia, unspecified: Secondary | ICD-10-CM

## 2023-10-30 DIAGNOSIS — Z79899 Other long term (current) drug therapy: Secondary | ICD-10-CM | POA: Diagnosis not present

## 2023-10-30 LAB — CBC WITH DIFFERENTIAL (CANCER CENTER ONLY)
Abs Immature Granulocytes: 0.1 10*3/uL — ABNORMAL HIGH (ref 0.00–0.07)
Basophils Absolute: 0.1 10*3/uL (ref 0.0–0.1)
Basophils Relative: 1 %
Eosinophils Absolute: 0.1 10*3/uL (ref 0.0–0.5)
Eosinophils Relative: 1 %
HCT: 24.8 % — ABNORMAL LOW (ref 36.0–46.0)
Hemoglobin: 7.7 g/dL — ABNORMAL LOW (ref 12.0–15.0)
Immature Granulocytes: 1 %
Lymphocytes Relative: 20 %
Lymphs Abs: 2.3 10*3/uL (ref 0.7–4.0)
MCH: 30.2 pg (ref 26.0–34.0)
MCHC: 31 g/dL (ref 30.0–36.0)
MCV: 97.3 fL (ref 80.0–100.0)
Monocytes Absolute: 0.7 10*3/uL (ref 0.1–1.0)
Monocytes Relative: 6 %
Neutro Abs: 8.5 10*3/uL — ABNORMAL HIGH (ref 1.7–7.7)
Neutrophils Relative %: 71 %
Platelet Count: 637 10*3/uL — ABNORMAL HIGH (ref 150–400)
RBC: 2.55 MIL/uL — ABNORMAL LOW (ref 3.87–5.11)
RDW: 14.7 % (ref 11.5–15.5)
WBC Count: 11.8 10*3/uL — ABNORMAL HIGH (ref 4.0–10.5)
nRBC: 0 % (ref 0.0–0.2)

## 2023-10-30 LAB — IRON AND IRON BINDING CAPACITY (CC-WL,HP ONLY)
Iron: 24 ug/dL — ABNORMAL LOW (ref 28–170)
Saturation Ratios: 13 % (ref 10.4–31.8)
TIBC: 185 ug/dL — ABNORMAL LOW (ref 250–450)
UIBC: 161 ug/dL (ref 148–442)

## 2023-10-30 LAB — SAMPLE TO BLOOD BANK

## 2023-10-30 LAB — FERRITIN: Ferritin: 940 ng/mL — ABNORMAL HIGH (ref 11–307)

## 2023-10-30 LAB — PREPARE RBC (CROSSMATCH)

## 2023-10-30 LAB — VITAMIN B12: Vitamin B-12: 478 pg/mL (ref 180–914)

## 2023-10-30 LAB — FOLATE: Folate: 29.5 ng/mL (ref >5.9)

## 2023-10-30 LAB — C-REACTIVE PROTEIN: CRP: 7.8 mg/dL — ABNORMAL HIGH (ref <1.0)

## 2023-10-30 LAB — SEDIMENTATION RATE: Sed Rate: 80 mm/h — ABNORMAL HIGH (ref 0–22)

## 2023-10-31 ENCOUNTER — Other Ambulatory Visit: Payer: Self-pay | Admitting: Physician Assistant

## 2023-10-31 ENCOUNTER — Inpatient Hospital Stay: Payer: 59

## 2023-10-31 DIAGNOSIS — N2889 Other specified disorders of kidney and ureter: Secondary | ICD-10-CM

## 2023-10-31 DIAGNOSIS — D649 Anemia, unspecified: Secondary | ICD-10-CM

## 2023-10-31 DIAGNOSIS — G8929 Other chronic pain: Secondary | ICD-10-CM

## 2023-10-31 MED ORDER — DIPHENHYDRAMINE HCL 25 MG PO CAPS
25.0000 mg | ORAL_CAPSULE | Freq: Once | ORAL | Status: AC
Start: 2023-10-31 — End: 2023-10-31
  Administered 2023-10-31: 25 mg via ORAL
  Filled 2023-10-31: qty 1

## 2023-10-31 MED ORDER — IBUPROFEN 200 MG PO TABS
400.0000 mg | ORAL_TABLET | Freq: Once | ORAL | Status: AC
  Administered 2023-10-31: 400 mg via ORAL
  Filled 2023-10-31: qty 2

## 2023-10-31 MED ORDER — ACETAMINOPHEN 325 MG PO TABS
650.0000 mg | ORAL_TABLET | Freq: Once | ORAL | Status: AC
  Administered 2023-10-31: 650 mg via ORAL
  Filled 2023-10-31: qty 2

## 2023-10-31 MED ORDER — SODIUM CHLORIDE 0.9% IV SOLUTION
250.0000 mL | INTRAVENOUS | Status: DC
  Administered 2023-10-31: 100 mL via INTRAVENOUS

## 2023-10-31 NOTE — Patient Instructions (Signed)
 Blood Transfusion, Adult A blood transfusion is a procedure in which you receive blood or a type of blood cell (blood component) through an IV. You may need a blood transfusion when you have a low blood count, which is a low number of any blood cell. This may result from a bleeding disorder, illness, injury, or surgery. The blood may come from a donor, or you may be able to have your own blood collected and stored (autologous blood donation) before a planned surgery. The blood given in a transfusion may be made up of different blood components. You may receive: Red blood cells. These carry oxygen to the cells in the body. Platelets. These help your blood to clot. Plasma. This is the liquid part of your blood. It carries proteins and other substances throughout the body. White blood cells. These help you fight infections. If you have hemophilia or another clotting disorder, you may also receive other types of blood products. Depending on the type of blood product, this procedure may take 1-4 hours to complete. Tell a health care provider about: Any bleeding problems you have. Any previous reactions you have had during a blood transfusion. Any allergies you have. All medicines you are taking, including vitamins, herbs, eye drops, creams, and over-the-counter medicines. Any surgeries you have had. Any medical conditions you have. Whether you are pregnant or may be pregnant. What are the risks? Talk with your health care provider about risks. The most common problems include: A mild allergic reaction, such as red, swollen areas of skin (hives) and itching. Fever or chills. This may be the body's response to new blood cells received. This may occur during or up to 4 hours after the transfusion. More serious problems may include: A serious allergic reaction that causes difficulty breathing or swelling around the face and lips. Transfusion-associated circulatory overload (TACO), or too much fluid in  the lungs. This may cause breathing problems. Transfusion-related acute lung injury (TRALI), which causes breathing difficulty and low oxygen in the blood. This can occur within hours of the transfusion or several days later. Iron overload. This can happen after receiving many blood transfusions over a period of time. Infection or virus being transmitted. This is rare because donated blood is carefully tested before it is given. Hemolytic transfusion reaction. This is rare. It happens when the body's defense system (immune system)tries to attack the new blood cells. Symptoms may include fever, chills, nausea, low blood pressure, and low back or chest pain. Transfusion-associated graft-versus-host disease (TAGVHD). This is rare. It happens when donated cells attack the body's healthy tissues. What happens before the procedure? You will have a blood test to check your blood type. This test is done to know what kind of blood your body will accept and to match it to the donor blood. If you are going to have a planned surgery, you may be able to do an autologous blood donation. This may be done in case you need to have a transfusion. You will have your temperature, blood pressure, and pulse checked before the transfusion. If you have had an allergic reaction to a transfusion in the past, you may be given medicine to help prevent a reaction. This medicine may be given to you by mouth (orally) or through an IV. What happens during the procedure?  An IV will be inserted into one of your veins. The bag of blood will be attached to your IV. The blood will then enter through your vein. Your temperature, blood pressure,  and pulse will be monitored during the transfusion. This monitoring is done to detect early signs of a transfusion reaction. Tell your nurse right away if you have any of these symptoms during the transfusion: Shortness of breath or trouble breathing. Chest or back pain. Fever or  chills. Itching or hives. If you have any signs or symptoms of a reaction, your transfusion will be stopped and you may be given medicine. When the transfusion is complete, your IV will be removed. Pressure may be applied to the IV site for a few minutes. A bandage (dressing)will be applied. The procedure may vary among health care providers and hospitals. What happens after the procedure? Your temperature, blood pressure, pulse, breathing rate, and blood oxygen level will be monitored until you leave the hospital or clinic. Your blood may be tested to see how you have responded to the transfusion. You may be warmed with fluids or blankets to maintain a normal body temperature. If you receive your blood transfusion in an outpatient setting, you will be told whom to contact to report any reactions. Where to find more information Visit the American Red Cross: redcross.org Summary A blood transfusion is a procedure in which you receive blood or a type of blood cell (blood component) through an IV. The blood given in a transfusion may be made up of different blood components. You may receive red blood cells, platelets, plasma, or white blood cells depending on the condition treated. Your temperature, blood pressure, and pulse will be monitored before, during, and after the transfusion. After the transfusion, your blood may be tested to see how your body has responded. This information is not intended to replace advice given to you by your health care provider. Make sure you discuss any questions you have with your health care provider. Document Revised: 11/11/2021 Document Reviewed: 11/11/2021 Elsevier Patient Education  2024 [employer].

## 2023-11-01 ENCOUNTER — Encounter: Payer: Self-pay | Admitting: Medical Oncology

## 2023-11-01 LAB — BPAM RBC
Blood Product Expiration Date: 202503272359
ISSUE DATE / TIME: 202503050835
Unit Type and Rh: 202503272359
Unit Type and Rh: 5100

## 2023-11-01 LAB — TYPE AND SCREEN
ABO/RH(D): O POS
Antibody Screen: POSITIVE
DAT, IgG: NEGATIVE
Donor AG Type: NEGATIVE
Unit division: 0

## 2023-11-04 LAB — METHYLMALONIC ACID, SERUM: Methylmalonic Acid, Quantitative: 181 nmol/L (ref 0–378)

## 2023-11-06 ENCOUNTER — Other Ambulatory Visit: Payer: Self-pay | Admitting: Physician Assistant

## 2023-11-06 DIAGNOSIS — D649 Anemia, unspecified: Secondary | ICD-10-CM

## 2023-11-06 NOTE — Progress Notes (Signed)
 This encounter was created in error - please disregard.

## 2023-11-07 ENCOUNTER — Inpatient Hospital Stay

## 2023-11-07 DIAGNOSIS — D649 Anemia, unspecified: Secondary | ICD-10-CM

## 2023-11-07 DIAGNOSIS — N2889 Other specified disorders of kidney and ureter: Secondary | ICD-10-CM | POA: Diagnosis not present

## 2023-11-07 LAB — TECHNOLOGIST SMEAR REVIEW: Plt Morphology: NORMAL

## 2023-11-07 LAB — LACTATE DEHYDROGENASE: LDH: 297 U/L — ABNORMAL HIGH (ref 98–192)

## 2023-11-07 LAB — CBC WITH DIFFERENTIAL (CANCER CENTER ONLY)
Abs Immature Granulocytes: 0.12 10*3/uL — ABNORMAL HIGH (ref 0.00–0.07)
Basophils Absolute: 0.1 10*3/uL (ref 0.0–0.1)
Basophils Relative: 1 %
Eosinophils Absolute: 0.1 10*3/uL (ref 0.0–0.5)
Eosinophils Relative: 1 %
HCT: 32.3 % — ABNORMAL LOW (ref 36.0–46.0)
Hemoglobin: 10.2 g/dL — ABNORMAL LOW (ref 12.0–15.0)
Immature Granulocytes: 1 %
Lymphocytes Relative: 9 %
Lymphs Abs: 1.3 10*3/uL (ref 0.7–4.0)
MCH: 29.2 pg (ref 26.0–34.0)
MCHC: 31.6 g/dL (ref 30.0–36.0)
MCV: 92.6 fL (ref 80.0–100.0)
Monocytes Absolute: 0.8 10*3/uL (ref 0.1–1.0)
Monocytes Relative: 5 %
Neutro Abs: 13 10*3/uL — ABNORMAL HIGH (ref 1.7–7.7)
Neutrophils Relative %: 83 %
Platelet Count: 540 10*3/uL — ABNORMAL HIGH (ref 150–400)
RBC: 3.49 MIL/uL — ABNORMAL LOW (ref 3.87–5.11)
RDW: 15.5 % (ref 11.5–15.5)
WBC Count: 15.5 10*3/uL — ABNORMAL HIGH (ref 4.0–10.5)
nRBC: 0 % (ref 0.0–0.2)

## 2023-11-07 LAB — SAMPLE TO BLOOD BANK

## 2023-11-08 LAB — KAPPA/LAMBDA LIGHT CHAINS
Kappa free light chain: 101.1 mg/L — ABNORMAL HIGH (ref 3.3–19.4)
Kappa, lambda light chain ratio: 1.68 — ABNORMAL HIGH (ref 0.26–1.65)
Lambda free light chains: 60.1 mg/L — ABNORMAL HIGH (ref 5.7–26.3)

## 2023-11-08 LAB — HAPTOGLOBIN: Haptoglobin: 232 mg/dL (ref 42–346)

## 2023-11-09 LAB — COPPER, SERUM: Copper: 146 ug/dL (ref 80–158)

## 2023-11-11 LAB — MULTIPLE MYELOMA PANEL, SERUM
Albumin SerPl Elph-Mcnc: 3.2 g/dL (ref 2.9–4.4)
Albumin/Glob SerPl: 0.7 (ref 0.7–1.7)
Alpha 1: 0.5 g/dL — ABNORMAL HIGH (ref 0.0–0.4)
Alpha2 Glob SerPl Elph-Mcnc: 1 g/dL (ref 0.4–1.0)
B-Globulin SerPl Elph-Mcnc: 1.3 g/dL (ref 0.7–1.3)
Gamma Glob SerPl Elph-Mcnc: 2.1 g/dL — ABNORMAL HIGH (ref 0.4–1.8)
Globulin, Total: 4.8 g/dL — ABNORMAL HIGH (ref 2.2–3.9)
IgA: 484 mg/dL — ABNORMAL HIGH (ref 64–422)
IgG (Immunoglobin G), Serum: 2176 mg/dL — ABNORMAL HIGH (ref 586–1602)
IgM (Immunoglobulin M), Srm: 70 mg/dL (ref 26–217)
M Protein SerPl Elph-Mcnc: 0.6 g/dL — ABNORMAL HIGH
Total Protein ELP: 8 g/dL (ref 6.0–8.5)

## 2023-11-14 ENCOUNTER — Other Ambulatory Visit: Payer: Self-pay | Admitting: Physician Assistant

## 2023-11-14 DIAGNOSIS — D649 Anemia, unspecified: Secondary | ICD-10-CM

## 2023-11-14 NOTE — Progress Notes (Signed)
 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

## 2023-11-15 ENCOUNTER — Other Ambulatory Visit: Payer: Self-pay | Admitting: *Deleted

## 2023-11-15 ENCOUNTER — Telehealth: Payer: Self-pay

## 2023-11-15 DIAGNOSIS — D649 Anemia, unspecified: Secondary | ICD-10-CM

## 2023-11-15 NOTE — Telephone Encounter (Signed)
 Spoke with both sons, Arlys John and Peyton Najjar, and advised of BMBX appt 4/3 and time.  LM for Lurena Joiner at [facility] to return my call to review instructions

## 2023-11-16 ENCOUNTER — Encounter: Payer: Self-pay | Admitting: Medical Oncology

## 2023-11-16 ENCOUNTER — Other Ambulatory Visit: Payer: Self-pay

## 2023-11-16 DIAGNOSIS — D649 Anemia, unspecified: Secondary | ICD-10-CM

## 2023-11-16 NOTE — Telephone Encounter (Signed)
 Information regarding 4/3 BMBX and instructions faxed to [facility].  Confirmation received

## 2023-11-28 ENCOUNTER — Other Ambulatory Visit: Payer: Self-pay

## 2023-11-28 DIAGNOSIS — D649 Anemia, unspecified: Secondary | ICD-10-CM

## 2023-11-29 ENCOUNTER — Inpatient Hospital Stay: Attending: Physician Assistant | Admitting: Adult Health

## 2023-11-29 ENCOUNTER — Inpatient Hospital Stay

## 2023-11-29 VITALS — BP 150/85 | HR 80 | Temp 98.9°F | Resp 18

## 2023-11-29 DIAGNOSIS — Z79899 Other long term (current) drug therapy: Secondary | ICD-10-CM | POA: Diagnosis not present

## 2023-11-29 DIAGNOSIS — R700 Elevated erythrocyte sedimentation rate: Secondary | ICD-10-CM | POA: Insufficient documentation

## 2023-11-29 DIAGNOSIS — D464 Refractory anemia, unspecified: Secondary | ICD-10-CM

## 2023-11-29 DIAGNOSIS — N2889 Other specified disorders of kidney and ureter: Secondary | ICD-10-CM | POA: Diagnosis present

## 2023-11-29 DIAGNOSIS — D649 Anemia, unspecified: Secondary | ICD-10-CM | POA: Insufficient documentation

## 2023-11-29 LAB — CBC WITH DIFFERENTIAL (CANCER CENTER ONLY)
Abs Immature Granulocytes: 0.12 10*3/uL — ABNORMAL HIGH (ref 0.00–0.07)
Basophils Absolute: 0.1 10*3/uL (ref 0.0–0.1)
Basophils Relative: 1 %
Eosinophils Absolute: 0.2 10*3/uL (ref 0.0–0.5)
Eosinophils Relative: 1 %
HCT: 28.9 % — ABNORMAL LOW (ref 36.0–46.0)
Hemoglobin: 9 g/dL — ABNORMAL LOW (ref 12.0–15.0)
Immature Granulocytes: 1 %
Lymphocytes Relative: 9 %
Lymphs Abs: 1.4 10*3/uL (ref 0.7–4.0)
MCH: 29.2 pg (ref 26.0–34.0)
MCHC: 31.1 g/dL (ref 30.0–36.0)
MCV: 93.8 fL (ref 80.0–100.0)
Monocytes Absolute: 0.8 10*3/uL (ref 0.1–1.0)
Monocytes Relative: 5 %
Neutro Abs: 13.9 10*3/uL — ABNORMAL HIGH (ref 1.7–7.7)
Neutrophils Relative %: 83 %
Platelet Count: 467 10*3/uL — ABNORMAL HIGH (ref 150–400)
RBC: 3.08 MIL/uL — ABNORMAL LOW (ref 3.87–5.11)
RDW: 16 % — ABNORMAL HIGH (ref 11.5–15.5)
WBC Count: 16.5 10*3/uL — ABNORMAL HIGH (ref 4.0–10.5)
nRBC: 0 % (ref 0.0–0.2)

## 2023-11-29 LAB — SAMPLE TO BLOOD BANK

## 2023-11-29 MED ORDER — LIDOCAINE HCL 2 % IJ SOLN
INTRAMUSCULAR | Status: AC
  Filled 2023-11-29: qty 20

## 2023-11-29 NOTE — Progress Notes (Signed)
 INDICATION:refractory anemia, elevated SPEP  Brief examination was performed. ENT: adequate airway clearance Heart: regular rate and rhythm.No Murmurs Lungs: clear to auscultation, no wheezes, normal respiratory effort  Bone Marrow Biopsy and Aspiration Procedure Note   Informed consent was obtained and potential risks including bleeding, infection and pain were reviewed with the patient.  The patient's name, date of birth, identification, consent and allergies were verified prior to the start of procedure and time out was performed.  The right posterior iliac crest was chosen as the site of biopsy.  The skin was prepped with ChloraPrep.   8 cc of 2% lidocaine was used to provide local anaesthesia.   10 cc of bone marrow aspirate was obtained followed by 1cm biopsy.  Pressure was applied to the biopsy site and bandage was placed over the biopsy site. Patient was made to lie on the back for 30 mins prior to discharge.  The procedure was tolerated well. COMPLICATIONS: None BLOOD LOSS: none The patient was discharged home in stable condition with a 1 week follow up to review results.  Patient was provided with post bone marrow biopsy instructions and instructed to call if there was any bleeding or worsening pain.  Specimens sent for flow cytometry, cytogenetics and additional studies.  Signed Noreene Filbert, NP

## 2023-11-29 NOTE — Patient Instructions (Signed)
 Bone Marrow Aspiration and Bone Marrow Biopsy, Adult, Care After The following information offers guidance on how to care for yourself after your procedure. Your health care provider may also give you more specific instructions. If you have problems or questions, contact your health care provider. What can I expect after the procedure? After the procedure, it is common to have: Mild pain and tenderness. Swelling. Bruising. Follow these instructions at home: Incision care  Follow instructions from your health care provider about how to take care of the incision site. Make sure you: Wash your hands with soap and water for at least 20 seconds before and after you change your bandage (dressing). If soap and water are not available, use hand sanitizer. Change your dressing as told by your health care provider. Leave stitches (sutures), skin glue, or adhesive strips in place. These skin closures may need to stay in place for 2 weeks or longer. If adhesive strip edges start to loosen and curl up, you may trim the loose edges. Do not remove adhesive strips completely unless your health care provider tells you to do that. Check your incision site every day for signs of infection. Check for: More redness, swelling, or pain. Fluid or blood. Warmth. Pus or a bad smell. Activity Return to your normal activities as told by your health care provider. Ask your health care provider what activities are safe for you. Do not lift anything that is heavier than 10 lb (4.5 kg), or the limit that you are told, until your health care provider says that it is safe. If you were given a sedative during the procedure, it can affect you for several hours. Do not drive or operate machinery until your health care provider says that it is safe. General instructions  Take over-the-counter and prescription medicines only as told by your health care provider. Do not take baths, swim, or use a hot tub until your health care  provider approves. Ask your health care provider if you may take showers. You may only be allowed to take sponge baths. If directed, put ice on the affected area. To do this: Put ice in a plastic bag. Place a towel between your skin and the bag. Leave the ice on for 20 minutes, 2-3 times a day. If your skin turns bright red, remove the ice right away to prevent skin damage. The risk of skin damage is higher if you cannot feel pain, heat, or cold. Contact a health care provider if: You have signs of infection. Your pain is not controlled with medicine. You have cancer, and a temperature of 100.89F (38C) or higher. Get help right away if: You have a temperature of 101F (38.3C) or higher, or as told by your health care provider. You have bleeding from the incision site that cannot be controlled. This information is not intended to replace advice given to you by your health care provider. Make sure you discuss any questions you have with your health care provider. Document Revised: 12/19/2021 Document Reviewed: 12/19/2021 Elsevier Patient Education  2024 [employer].

## 2023-11-29 NOTE — Progress Notes (Signed)
 Pt observed for 30 minutes post BMBX. Pt tolerated procedure well w/out incident. VSS at discharge.  W/C assist to lobby.

## 2023-12-04 LAB — SURGICAL PATHOLOGY

## 2023-12-06 ENCOUNTER — Other Ambulatory Visit: Payer: Self-pay | Admitting: Physician Assistant

## 2023-12-07 ENCOUNTER — Inpatient Hospital Stay

## 2023-12-07 ENCOUNTER — Inpatient Hospital Stay (HOSPITAL_BASED_OUTPATIENT_CLINIC_OR_DEPARTMENT_OTHER): Admitting: Physician Assistant

## 2023-12-07 VITALS — BP 134/91 | HR 98 | Temp 98.9°F | Resp 18 | Ht 65.0 in | Wt 110.8 lb

## 2023-12-07 DIAGNOSIS — D649 Anemia, unspecified: Secondary | ICD-10-CM

## 2023-12-07 DIAGNOSIS — N2889 Other specified disorders of kidney and ureter: Secondary | ICD-10-CM

## 2023-12-07 DIAGNOSIS — R700 Elevated erythrocyte sedimentation rate: Secondary | ICD-10-CM | POA: Diagnosis not present

## 2023-12-07 LAB — CMP (CANCER CENTER ONLY)
ALT: 46 U/L — ABNORMAL HIGH (ref 0–44)
AST: 43 U/L — ABNORMAL HIGH (ref 15–41)
Albumin: 3.4 g/dL — ABNORMAL LOW (ref 3.5–5.0)
Alkaline Phosphatase: 241 U/L — ABNORMAL HIGH (ref 38–126)
Anion gap: 5 (ref 5–15)
BUN: 15 mg/dL (ref 8–23)
CO2: 21 mmol/L — ABNORMAL LOW (ref 22–32)
Calcium: 10.1 mg/dL (ref 8.9–10.3)
Chloride: 111 mmol/L (ref 98–111)
Creatinine: 0.99 mg/dL (ref 0.44–1.00)
GFR, Estimated: >60 mL/min (ref >60)
Glucose, Bld: 118 mg/dL — ABNORMAL HIGH (ref 70–99)
Potassium: 3.8 mmol/L (ref 3.5–5.1)
Sodium: 137 mmol/L (ref 135–145)
Total Bilirubin: 0.3 mg/dL (ref 0.0–1.2)
Total Protein: 7.8 g/dL (ref 6.5–8.1)

## 2023-12-07 LAB — CBC WITH DIFFERENTIAL (CANCER CENTER ONLY)
Abs Immature Granulocytes: 0.09 10*3/uL — ABNORMAL HIGH (ref 0.00–0.07)
Basophils Absolute: 0.1 10*3/uL (ref 0.0–0.1)
Basophils Relative: 1 %
Eosinophils Absolute: 0.2 10*3/uL (ref 0.0–0.5)
Eosinophils Relative: 1 %
HCT: 30.5 % — ABNORMAL LOW (ref 36.0–46.0)
Hemoglobin: 9.6 g/dL — ABNORMAL LOW (ref 12.0–15.0)
Immature Granulocytes: 1 %
Lymphocytes Relative: 16 %
Lymphs Abs: 2.4 10*3/uL (ref 0.7–4.0)
MCH: 28.8 pg (ref 26.0–34.0)
MCHC: 31.5 g/dL (ref 30.0–36.0)
MCV: 91.6 fL (ref 80.0–100.0)
Monocytes Absolute: 1 10*3/uL (ref 0.1–1.0)
Monocytes Relative: 7 %
Neutro Abs: 11.3 10*3/uL — ABNORMAL HIGH (ref 1.7–7.7)
Neutrophils Relative %: 74 %
Platelet Count: 511 10*3/uL — ABNORMAL HIGH (ref 150–400)
RBC: 3.33 MIL/uL — ABNORMAL LOW (ref 3.87–5.11)
RDW: 16 % — ABNORMAL HIGH (ref 11.5–15.5)
WBC Count: 15 10*3/uL — ABNORMAL HIGH (ref 4.0–10.5)
nRBC: 0 % (ref 0.0–0.2)

## 2023-12-07 LAB — SAMPLE TO BLOOD BANK

## 2023-12-07 NOTE — Progress Notes (Signed)
 [facility] Telephone:([phone]) [phone]   Fax:([phone]) [phone]  FOLLOW UP VISIT:  Patient Care Team: Pcp, No as PCP - General Leonetti, Becky Augusta, RN as Oncology Nurse Navigator (Medical Oncology) Vilma Prader, MD (Urology)  CHIEF COMPLAINTS/PURPOSE OF CONSULTATION:  Left renal mass Anemia  HISTORY OF PRESENTING ILLNESS:  Heidi Blevins 73 y.o. female returns for a follow up for anemia and left renal mass. She was last seen on 10/26/2023 to establish care. In the interim, she underwent bone marrow biopsy to further evaluate her anemia She is accompanied by a caretaker for this visit.   On exam today, [name] currently resides in a assisted living facility.  She reports that her energy levels have improved since receiving a blood transfusion.  She still needs assistance to complete her daily activities.  Her appetite is fair and she has lost 7 pounds since her initial visit back in February.  She denies nausea, vomiting or bowel habit changes.She denies easy bruising or signs of bleeding. She denies fevers, chills, sweats, shortness of breath, chest pain or cough. She has no other complaints. 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   Procedure: LEFT TOTAL HIP ARTHROPLASTY ANTERIOR APPROACH;  Surgeon: Tarry Kos, MD;  Location: [facility] OR;  Service: Orthopedics;  Laterality: Left;    SOCIAL HISTORY: Social History   Socioeconomic History   Marital status: Divorced     Spouse name: Not on file   Number of children: Not on file   Years of education: Not on file   Highest education level: Not on file  Occupational History   Occupation: Unemployed  Tobacco Use   Smoking status: Every Day    Current packs/day: 0.25    Average packs/day: 0.3 packs/day for 40.0 years (10.0 ttl pk-yrs)    Types: Cigarettes   Smokeless tobacco: Never  Substance and Sexual Activity   Alcohol use: No   Drug use: No   Sexual activity: Never  Other Topics Concern   Not on file  Social History Narrative   Lives in an ALF.  Normally independent of ADLs and with ambulation.   Social Drivers of Corporate investment banker Strain: Not on file  Food Insecurity: Patient Unable To Answer (09/23/2023)   Hunger Vital Sign    Worried About Running Out of Food in the Last Year: Patient unable to answer    Ran Out of Food in the Last Year: Patient unable to answer  Transportation Needs: Patient Unable To Answer (09/23/2023)   PRAPARE - Transportation    Lack of Transportation (Medical): Patient unable to answer    Lack of Transportation (Non-Medical): Patient unable to answer  Physical Activity: Not on file  Stress: Not on file  Social Connections: Unknown (09/23/2023)   Social Connection and Isolation Panel [NHANES]    Frequency of Communication with Friends  and Family: Patient unable to answer    Frequency of Social Gatherings with Friends and Family: Patient unable to answer    Attends Religious Services: Patient unable to answer    Active Member of Clubs or Organizations: Patient unable to answer    Attends Banker Meetings: Patient unable to answer    Marital Status: Widowed  Intimate Partner Violence: Patient Unable To Answer (09/23/2023)   Humiliation, Afraid, Rape, and Kick questionnaire    Fear of Current or Ex-Partner: Patient unable to answer    Emotionally Abused: Patient unable to answer    Physically Abused: Patient unable to answer    Sexually Abused:  Patient unable to answer    FAMILY HISTORY: Family History  Problem Relation Age of Onset   CVA Mother    Hypertension Mother    Lupus Sister    Hypertension Brother    Peptic Ulcer Disease Father    Colon cancer Neg Hx     ALLERGIES:  is allergic to sulfonamide derivatives.  MEDICATIONS:  Current Outpatient Medications  Medication Sig Dispense Refill   acetaminophen (TYLENOL) 500 MG tablet Take 1,000 mg by mouth in the morning and at bedtime.     atorvastatin (LIPITOR) 40 MG tablet Take 1 tablet (40 mg total) by mouth daily. (Patient taking differently: Take 40 mg by mouth at bedtime.) 30 tablet 0   benztropine (COGENTIN) 1 MG tablet Take 1 mg by mouth 2 (two) times daily.     clopidogrel (PLAVIX) 75 MG tablet Take 1 tablet (75 mg total) by mouth daily. 17 tablet 0   Cranberry 425 MG CAPS Take 425 mg by mouth 2 (two) times a day.     ferrous sulfate (FEROSUL) 325 (65 FE) MG tablet Take 325 mg by mouth daily with breakfast.     fluPHENAZine (PROLIXIN) 10 MG tablet Take 20 mg by mouth in the morning and at bedtime.     hydrocortisone 2.5 % ointment Apply 1 Application topically daily as needed (dry, itchy skin).     ibuprofen (ADVIL) 600 MG tablet Take 600 mg by mouth 2 (two) times daily as needed.     lisinopril (ZESTRIL) 20 MG tablet Take 20 mg by mouth daily.     loratadine (CLARITIN) 10 MG tablet Take 10 mg by mouth daily.     LORazepam (ATIVAN) 0.5 MG tablet Take 0.5 mg by mouth in the morning, at noon, in the evening, and at bedtime.     Lurasidone HCl (LATUDA) 120 MG TABS Take 120 mg by mouth daily.     melatonin 5 MG TABS Take 5 mg by mouth at bedtime.     metoprolol tartrate (LOPRESSOR) 25 MG tablet Take 25 mg by mouth 2 (two) times daily.     Multiple Vitamins-Minerals (MULTIVITAMINS THER. W/MINERALS) TABS tablet Take 1 tablet by mouth daily.     polyethylene glycol powder (GLYCOLAX/MIRALAX) 17 GM/SCOOP powder Take 17 g by mouth daily.     Pramoxine HCl (VAGISIL ANTI-ITCH  MEDICATED) 1 % MISC Apply vaginally once a day Apply 1 application topically daily as needed (for vaginal itching). Apply vaginally once a day (Patient taking differently: Apply 1 application  topically daily as needed (for vaginal itching).) 20 each 0   Propylene Glycol (SYSTANE BALANCE OP) Place 1 drop into both eyes 3 (three) times daily.     sertraline (ZOLOFT) 100 MG tablet Take 150 mg by mouth daily.     simethicone (MYLICON) 80 MG chewable tablet Chew 1  tablet (80 mg total) by mouth 4 (four) times daily. 30 tablet 0   lithium carbonate 150 MG capsule Take 1 capsule (150 mg total) by mouth at bedtime. 30 capsule 0   No current facility-administered medications for this visit.    REVIEW OF SYSTEMS:   Constitutional: ( - ) fevers, ( - )  chills , ( - ) night sweats Eyes: ( - ) blurriness of vision, ( - ) double vision, ( - ) watery eyes Ears, nose, mouth, throat, and face: ( - ) mucositis, ( - ) sore throat Respiratory: ( - ) cough, ( - ) dyspnea, ( - ) wheezes Cardiovascular: ( - ) palpitation, ( - ) chest discomfort, ( - ) lower extremity swelling Gastrointestinal:  ( - ) nausea, ( - ) heartburn, ( - ) change in bowel habits Skin: ( - ) abnormal skin rashes Lymphatics: ( - ) new lymphadenopathy, ( - ) easy bruising Neurological: ( - ) numbness, ( - ) tingling, ( - ) new weaknesses Behavioral/Psych: ( - ) mood change, ( - ) new changes  All other systems were reviewed with the patient and are negative.  PHYSICAL EXAMINATION: ECOG PERFORMANCE STATUS: 3 - Symptomatic, >50% confined to bed  Vitals:   12/07/23 1315  BP: (!) 134/91  Pulse: 98  Resp: 18  Temp: 98.9 F (37.2 C)  SpO2: 100%   Filed Weights   12/07/23 1315  Weight: 110 lb 12.8 oz (50.3 kg)    GENERAL: chronically ill, elderly female in NAD.  She is in a wheelchair during this visit. SKIN: skin color, texture, turgor are normal, no rashes or significant lesions EYES: conjunctiva are pink and non-injected, sclera  clear LUNGS: clear to auscultation and percussion with normal breathing effort HEART: regular rate & rhythm and no murmurs and no lower extremity edema Musculoskeletal: no cyanosis of digits and no clubbing  PSYCH: alert & oriented x 3, fluent speech NEURO: no focal motor/sensory deficits  LABORATORY DATA:  I have reviewed the data as listed    Latest Ref Rng & Units 12/07/2023   12:55 PM 11/29/2023    9:14 AM 11/07/2023   10:58 AM  CBC  WBC 4.0 - 10.5 K/uL 15.0  16.5  15.5   Hemoglobin 12.0 - 15.0 g/dL 9.6  9.0  08.6   Hematocrit 36.0 - 46.0 % 30.5  28.9  32.3   Platelets 150 - 400 K/uL 511  467  540        Latest Ref Rng & Units 10/26/2023    2:48 PM 09/24/2023    4:43 AM 09/23/2023    2:20 PM  CMP  Glucose 70 - 99 mg/dL 87  578  95   BUN 8 - 23 mg/dL 16  21  19    Creatinine 0.44 - 1.00 mg/dL 4.69  6.29  5.28   Sodium 135 - 145 mmol/L 135  137  137   Potassium 3.5 - 5.1 mmol/L 4.3  3.6  3.8   Chloride 98 - 111 mmol/L 110  106  108   CO2 22 - 32 mmol/L 22  19  16    Calcium 8.9 - 10.3 mg/dL 9.7  41.3  24.4   Total Protein 6.5 - 8.1 g/dL 7.5     Total Bilirubin 0.0 - 1.2 mg/dL 0.4     Alkaline Phos 38 - 126 U/L 162     AST 15 - 41 U/L 55     ALT 0 - 44 U/L 81  RADIOGRAPHIC STUDIES:  CT renal abdomen on 09/24/2023: IMPRESSION: 1. Vague, heterogeneous and enhancing lesion in the lower pole left kidney, worrisome for renal cell carcinoma. 2. Small pericardial effusion. 3. Cholelithiasis. 4. Aortic atherosclerosis (ICD10-I70.0). Coronary artery calcification.  ASSESSMENT & PLAN TAUNA MACFARLANE is a 73 y.o. female who presents to the rapid diagnostic clinic for evaluation of left renal mass worrisome for renal cell carcinoma.    #Anemia: -- Labs from 10/30/2023 shows anemia with a hemoglobin of 7.7, MCV 97.3.  She received 1 unit of PRBC.  Workup showed elevated sedimentation rate and C-reactive protein levels.  There was no evidence of nutritional deficiencies  including iron, B12 or folate deficiencies. -- Secondary workup from 11/07/2023 detected monoclonal protein measuring 0.6 g/dL.  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  There was no evidence of hemolysis or copper deficiency. -- Underwent bone marrow biopsy on 11/29/2023.  Pathology did not indicate an underlying bone marrow disorder.  --Labs today show WBC 15.0, hemoglobin 9.6, platelet 511.  Overall findings are suggestive of an inflammatory process. -- Will make a referral to rheumatology for further evaluation.  #Left renal mass:  --Reviewed the CT scan from 09/24/2023 in detail that shows a 1.9 x 2.2 cm left renal mass without evidence of metastatic disease. --Evaluated by urologist, Dr. Jennette Bill on 11/16/2023.  Treatment recommendations include cryoablation versus observation in due to patient's ongoing comorbidities.  She will return on 12/26/2023 with her son to finalize recommendations  #Elevated LFTs: --Mild elevation with AST 43, ALT 46.  T. bili normal. --CT scan of the abdomen pelvis from January 2025 shows no abnormalities in the liver. -- Monitor for now.  Follow up: --Pending treatment for renal mass as mentioned above.  No orders of the defined types were placed in this encounter.   All questions were answered. The patient knows to call the clinic with any problems, questions or concerns.  I have spent a total of 25 minutes minutes of face-to-face and non-face-to-face time, preparing to see the patient, obtaining and/or reviewing separately obtained history, performing a medically appropriate examination, counseling and educating the patient, ordering medications/tests/procedures, referring and communicating with other health care professionals, documenting clinical information in the electronic health record, independently interpreting results and communicating results to the patient, and care coordination.   Georga Kaufmann,  PA-C Department of Hematology/Oncology [facility] Phone: [phone]

## 2023-12-12 ENCOUNTER — Encounter (HOSPITAL_COMMUNITY): Payer: Self-pay | Admitting: Physician Assistant

## 2023-12-18 ENCOUNTER — Ambulatory Visit (HOSPITAL_COMMUNITY)

## 2024-02-09 ENCOUNTER — Emergency Department (HOSPITAL_COMMUNITY)

## 2024-02-09 ENCOUNTER — Other Ambulatory Visit: Payer: Self-pay

## 2024-02-09 ENCOUNTER — Encounter (HOSPITAL_COMMUNITY): Payer: Self-pay

## 2024-02-09 ENCOUNTER — Inpatient Hospital Stay (HOSPITAL_COMMUNITY)
Admission: EM | Admit: 2024-02-09 | Discharge: 2024-02-20 | DRG: 690 | Disposition: A | Discharge status: Home Health | Source: Skilled Nursing Facility | Attending: Internal Medicine | Admitting: Internal Medicine

## 2024-02-09 DIAGNOSIS — Z96642 Presence of left artificial hip joint: Secondary | ICD-10-CM | POA: Diagnosis present

## 2024-02-09 DIAGNOSIS — Z8269 Family history of other diseases of the musculoskeletal system and connective tissue: Secondary | ICD-10-CM

## 2024-02-09 DIAGNOSIS — E785 Hyperlipidemia, unspecified: Secondary | ICD-10-CM | POA: Diagnosis present

## 2024-02-09 DIAGNOSIS — Z8673 Personal history of transient ischemic attack (TIA), and cerebral infarction without residual deficits: Secondary | ICD-10-CM

## 2024-02-09 DIAGNOSIS — E871 Hypo-osmolality and hyponatremia: Secondary | ICD-10-CM | POA: Diagnosis present

## 2024-02-09 DIAGNOSIS — B961 Klebsiella pneumoniae [K. pneumoniae] as the cause of diseases classified elsewhere: Secondary | ICD-10-CM | POA: Diagnosis present

## 2024-02-09 DIAGNOSIS — N320 Bladder-neck obstruction: Secondary | ICD-10-CM | POA: Diagnosis present

## 2024-02-09 DIAGNOSIS — E872 Acidosis, unspecified: Secondary | ICD-10-CM | POA: Diagnosis present

## 2024-02-09 DIAGNOSIS — N2889 Other specified disorders of kidney and ureter: Secondary | ICD-10-CM | POA: Diagnosis present

## 2024-02-09 DIAGNOSIS — I5032 Chronic diastolic (congestive) heart failure: Secondary | ICD-10-CM | POA: Diagnosis present

## 2024-02-09 DIAGNOSIS — Z8249 Family history of ischemic heart disease and other diseases of the circulatory system: Secondary | ICD-10-CM

## 2024-02-09 DIAGNOSIS — N3289 Other specified disorders of bladder: Secondary | ICD-10-CM | POA: Diagnosis present

## 2024-02-09 DIAGNOSIS — N136 Pyonephrosis: Secondary | ICD-10-CM | POA: Diagnosis not present

## 2024-02-09 DIAGNOSIS — N939 Abnormal uterine and vaginal bleeding, unspecified: Secondary | ICD-10-CM | POA: Diagnosis present

## 2024-02-09 DIAGNOSIS — Z823 Family history of stroke: Secondary | ICD-10-CM

## 2024-02-09 DIAGNOSIS — Z79899 Other long term (current) drug therapy: Secondary | ICD-10-CM

## 2024-02-09 DIAGNOSIS — F259 Schizoaffective disorder, unspecified: Secondary | ICD-10-CM | POA: Diagnosis present

## 2024-02-09 DIAGNOSIS — D62 Acute posthemorrhagic anemia: Secondary | ICD-10-CM | POA: Diagnosis present

## 2024-02-09 DIAGNOSIS — F32A Depression, unspecified: Secondary | ICD-10-CM | POA: Diagnosis present

## 2024-02-09 DIAGNOSIS — Z7902 Long term (current) use of antithrombotics/antiplatelets: Secondary | ICD-10-CM

## 2024-02-09 DIAGNOSIS — D649 Anemia, unspecified: Secondary | ICD-10-CM

## 2024-02-09 DIAGNOSIS — Z66 Do not resuscitate: Secondary | ICD-10-CM | POA: Diagnosis present

## 2024-02-09 DIAGNOSIS — N1330 Unspecified hydronephrosis: Principal | ICD-10-CM

## 2024-02-09 DIAGNOSIS — E8809 Other disorders of plasma-protein metabolism, not elsewhere classified: Secondary | ICD-10-CM | POA: Diagnosis present

## 2024-02-09 DIAGNOSIS — Z87440 Personal history of urinary (tract) infections: Secondary | ICD-10-CM

## 2024-02-09 DIAGNOSIS — N1831 Chronic kidney disease, stage 3a: Secondary | ICD-10-CM | POA: Diagnosis present

## 2024-02-09 DIAGNOSIS — Z1612 Extended spectrum beta lactamase (ESBL) resistance: Secondary | ICD-10-CM | POA: Diagnosis present

## 2024-02-09 DIAGNOSIS — F17210 Nicotine dependence, cigarettes, uncomplicated: Secondary | ICD-10-CM | POA: Diagnosis present

## 2024-02-09 DIAGNOSIS — F419 Anxiety disorder, unspecified: Secondary | ICD-10-CM | POA: Diagnosis present

## 2024-02-09 DIAGNOSIS — N390 Urinary tract infection, site not specified: Secondary | ICD-10-CM

## 2024-02-09 DIAGNOSIS — N179 Acute kidney failure, unspecified: Secondary | ICD-10-CM | POA: Diagnosis present

## 2024-02-09 DIAGNOSIS — Z882 Allergy status to sulfonamides status: Secondary | ICD-10-CM

## 2024-02-09 DIAGNOSIS — I130 Hypertensive heart and chronic kidney disease with heart failure and stage 1 through stage 4 chronic kidney disease, or unspecified chronic kidney disease: Secondary | ICD-10-CM | POA: Diagnosis present

## 2024-02-09 LAB — CBC WITH DIFFERENTIAL/PLATELET
Abs Immature Granulocytes: 0.29 10*3/uL — ABNORMAL HIGH (ref 0.00–0.07)
Basophils Absolute: 0.1 10*3/uL (ref 0.0–0.1)
Basophils Relative: 0 %
Eosinophils Absolute: 0.1 10*3/uL (ref 0.0–0.5)
Eosinophils Relative: 0 %
HCT: 27.5 % — ABNORMAL LOW (ref 36.0–46.0)
Hemoglobin: 8.5 g/dL — ABNORMAL LOW (ref 12.0–15.0)
Immature Granulocytes: 1 %
Lymphocytes Relative: 10 %
Lymphs Abs: 2.1 10*3/uL (ref 0.7–4.0)
MCH: 29.6 pg (ref 26.0–34.0)
MCHC: 30.9 g/dL (ref 30.0–36.0)
MCV: 95.8 fL (ref 80.0–100.0)
Monocytes Absolute: 1.1 10*3/uL — ABNORMAL HIGH (ref 0.1–1.0)
Monocytes Relative: 5 %
Neutro Abs: 16.7 10*3/uL — ABNORMAL HIGH (ref 1.7–7.7)
Neutrophils Relative %: 84 %
Platelets: 514 10*3/uL — ABNORMAL HIGH (ref 150–400)
RBC: 2.87 MIL/uL — ABNORMAL LOW (ref 3.87–5.11)
RDW: 14.5 % (ref 11.5–15.5)
WBC: 20.3 10*3/uL — ABNORMAL HIGH (ref 4.0–10.5)
nRBC: 0 % (ref 0.0–0.2)

## 2024-02-09 LAB — COMPREHENSIVE METABOLIC PANEL WITH GFR
ALT: 48 U/L — ABNORMAL HIGH (ref 0–44)
AST: 45 U/L — ABNORMAL HIGH (ref 15–41)
Albumin: 2.8 g/dL — ABNORMAL LOW (ref 3.5–5.0)
Alkaline Phosphatase: 230 U/L — ABNORMAL HIGH (ref 38–126)
Anion gap: 10 (ref 5–15)
BUN: 23 mg/dL (ref 8–23)
CO2: 17 mmol/L — ABNORMAL LOW (ref 22–32)
Calcium: 9.5 mg/dL (ref 8.9–10.3)
Chloride: 104 mmol/L (ref 98–111)
Creatinine, Ser: 1.09 mg/dL — ABNORMAL HIGH (ref 0.44–1.00)
GFR, Estimated: 54 mL/min — ABNORMAL LOW (ref >60)
Glucose, Bld: 198 mg/dL — ABNORMAL HIGH (ref 70–99)
Potassium: 4 mmol/L (ref 3.5–5.1)
Sodium: 131 mmol/L — ABNORMAL LOW (ref 135–145)
Total Bilirubin: 0.2 mg/dL (ref 0.0–1.2)
Total Protein: 7.7 g/dL (ref 6.5–8.1)

## 2024-02-09 MED ORDER — ONDANSETRON HCL 4 MG/2ML IJ SOLN
4.0000 mg | Freq: Once | INTRAMUSCULAR | Status: AC
  Filled 2024-02-09: qty 2

## 2024-02-09 MED ORDER — IOHEXOL 300 MG/ML  SOLN: 80.0000 mL | Freq: Once | INTRAMUSCULAR | Status: AC | PRN

## 2024-02-09 MED ORDER — FENTANYL CITRATE PF 50 MCG/ML IJ SOSY
50.0000 ug | PREFILLED_SYRINGE | Freq: Once | INTRAMUSCULAR | Status: AC
  Administered 2024-02-09: 50 ug via INTRAVENOUS
  Filled 2024-02-09: qty 1

## 2024-02-09 MED ORDER — BENZTROPINE MESYLATE 1 MG/ML IJ SOLN
1.0000 mg | Freq: Once | INTRAMUSCULAR | Status: AC
  Filled 2024-02-09: qty 2

## 2024-02-09 MED ORDER — SODIUM CHLORIDE 0.9 % IV SOLN
2.0000 g | Freq: Once | INTRAVENOUS | Status: AC
  Filled 2024-02-09: qty 20

## 2024-02-09 MED ORDER — LACTATED RINGERS IV SOLN: INTRAVENOUS | Status: AC

## 2024-02-09 MED ORDER — FENTANYL CITRATE PF 50 MCG/ML IJ SOSY
50.0000 ug | PREFILLED_SYRINGE | Freq: Once | INTRAMUSCULAR | Status: AC
  Filled 2024-02-09: qty 1

## 2024-02-09 NOTE — ED Provider Notes (Signed)
 [place] EMERGENCY DEPARTMENT AT [facility] Provider Note   CSN: [id number] Arrival date & time: 02/09/24  1940     Patient presents with: Abdominal Pain and Vaginal Itching   Heidi Blevins is a 73 y.o. female.  {Add pertinent medical, surgical, social history, OB history to HPI:32947} HPI     73 year old patient comes in with chief complaint of vaginal bleeding.  Patient has history of complicated UTI/multiple UTI, CKD, renal mass that we are monitoring.  Patient resides at nursing home.  She started having vaginal bleeding, vaginal itching and lower abdominal pain for the last 1 day.  Patient does not take any blood thinners.  She normally does not get bleeding like this with UTI.  Patient's son at the bedside.  He provides collateral history as well.  Apparently patient was seen by oncology on 4-30.  Plan was made to reassess the patient in 3 months.   Patient also has history of recurrent blood transfusion due to anemia.  Bone marrow study was negative.  Prior to Admission medications   Medication Sig Start Date End Date Taking? Authorizing Provider  acetaminophen  (TYLENOL ) 500 MG tablet Take 1,000 mg by mouth in the morning and at bedtime.    [provider]  atorvastatin  (LIPITOR) 40 MG tablet Take 1 tablet (40 mg total) by mouth daily. Patient taking differently: Take 40 mg by mouth at bedtime. 08/13/20   Kraig Peru, MD  benztropine  (COGENTIN ) 1 MG tablet Take 1 mg by mouth 2 (two) times daily.    [provider]  clopidogrel  (PLAVIX ) 75 MG tablet Take 1 tablet (75 mg total) by mouth daily. 08/13/20   Kraig Peru, MD  Cranberry 425 MG CAPS Take 425 mg by mouth 2 (two) times a day.    [provider]  ferrous sulfate (FEROSUL) 325 (65 FE) MG tablet Take 325 mg by mouth daily with breakfast.    [provider]  fluPHENAZine  (PROLIXIN ) 10 MG tablet Take 20 mg by mouth in the morning and at bedtime.    [provider]  hydrocortisone 2.5 % ointment Apply 1 Application topically daily as needed (dry, itchy skin).    [provider]  ibuprofen  (ADVIL ) 600 MG tablet Take 600 mg by mouth 2 (two) times daily as needed. 06/22/23   [provider]  lisinopril  (ZESTRIL ) 20 MG tablet Take 20 mg by mouth daily. 04/18/22   [provider]  lithium  carbonate 150 MG capsule Take 1 capsule (150 mg total) by mouth at bedtime. 04/25/22 06/29/23  Lesa Rape, MD  loratadine  (CLARITIN ) 10 MG tablet Take 10 mg by mouth daily.    [provider]  LORazepam  (ATIVAN ) 0.5 MG tablet Take 0.5 mg by mouth in the morning, at noon, in the evening, and at bedtime.    [provider]  Lurasidone  HCl (LATUDA ) 120 MG TABS Take 120 mg by mouth daily.    [provider]  melatonin 5 MG TABS Take 5 mg by mouth at bedtime.    [provider]  metoprolol  tartrate (LOPRESSOR ) 25 MG tablet Take 25 mg by mouth 2 (two) times daily. 06/13/23   [provider]  Multiple Vitamins-Minerals (MULTIVITAMINS THER. W/MINERALS) TABS tablet Take 1 tablet by mouth daily.    [provider]  polyethylene glycol powder (GLYCOLAX /MIRALAX ) 17 GM/SCOOP powder Take 17 g by mouth daily.    [provider]  Pramoxine HCl (VAGISIL ANTI-ITCH MEDICATED) 1 % MISC Apply vaginally  once a day Apply 1 application topically daily as needed (for vaginal itching). Apply vaginally once a day Patient taking differently: Apply 1 application  topically daily as needed (for vaginal itching). 03/25/19   Armenta Landau, MD  Propylene Glycol (SYSTANE BALANCE OP) Place 1 drop into both eyes 3 (three) times daily.    [provider]  sertraline  (ZOLOFT ) 100 MG tablet Take 150 mg by mouth daily.    [provider]  simethicone  (MYLICON) 80 MG chewable tablet Chew 1 tablet (80 mg total) by mouth 4 (four) times daily. 08/13/20   Kraig Peru, MD    Allergies: Sulfonamide  derivatives    Review of Systems  All other systems reviewed and are negative.   Updated Vital Signs BP (!) 136/93   Pulse (!) 120   Temp 98.8 F (37.1 C) (Oral)   Resp 18   Wt 49.9 kg   SpO2 100%   BMI 18.30 kg/m   Physical Exam Vitals and nursing note reviewed.  Constitutional:      Appearance: She is well-developed.  HENT:     Head: Atraumatic.   Cardiovascular:     Rate and Rhythm: Normal rate.  Pulmonary:     Effort: Pulmonary effort is normal.  Abdominal:     Tenderness: There is abdominal tenderness in the right lower quadrant, suprapubic area and left lower quadrant.  Genitourinary:    Vagina: Bleeding present.   Musculoskeletal:     Cervical back: Normal range of motion and neck supple.   Skin:    General: Skin is warm and dry.   Neurological:     Mental Status: She is alert and oriented to person, place, and time.     (all labs ordered are listed, but only abnormal results are displayed) Labs Reviewed  COMPREHENSIVE METABOLIC PANEL WITH GFR - Abnormal; Notable for the following components:      Result Value   Sodium 131 (*)    CO2 17 (*)    Glucose, Bld 198 (*)    Creatinine, Ser 1.09 (*)    Albumin 2.8 (*)    AST 45 (*)    ALT 48 (*)    Alkaline Phosphatase 230 (*)    GFR, Estimated 54 (*)    All other components within normal limits  CBC WITH DIFFERENTIAL/PLATELET - Abnormal; Notable for the following components:   WBC 20.3 (*)    RBC 2.87 (*)    Hemoglobin 8.5 (*)    HCT 27.5 (*)    Platelets 514 (*)    Neutro Abs 16.7 (*)    Monocytes Absolute 1.1 (*)    Abs Immature Granulocytes 0.29 (*)    All other components within normal limits  WET PREP, GENITAL  URINE CULTURE  URINALYSIS, W/ REFLEX TO CULTURE (INFECTION SUSPECTED)  RPR  GC/CHLAMYDIA PROBE AMP ([place]) NOT AT [facility]    EKG: None  Radiology: No results found.  {Document cardiac monitor, telemetry assessment procedure when appropriate:32947} Procedures    Medications Ordered in the ED  benztropine  mesylate (COGENTIN ) injection 1 mg (has no administration in time range)  iohexol  (OMNIPAQUE ) 300 MG/ML solution 80 mL (80 mLs Intravenous Contrast Given 02/09/24 2230)  fentaNYL  (SUBLIMAZE ) injection 50 mcg (50 mcg Intravenous Given 02/09/24 2251)      {Click here for ABCD2, HEART and other calculators REFRESH Note before signing:1}  Medical Decision Making Amount and/or Complexity of Data Reviewed Labs: ordered. Radiology: ordered.  Risk Prescription drug management.   73 year old patient with history of recurrent UTI, known renal mass comes in with chief complaint of vaginal bleeding.  Collateral history  By patient's son.  Collateral history also gathered from reviewing patient's records.  Differential diagnosis for this patient includes hemorrhagic cystitis, bladder mass, renal mass.  On exam, it is clear that the bleeding is coming from the bladder.  Plan is to get basic labs, CT abdomen pelvis with contrast. Given that patient is having gross hematuria with large clots and has required blood transfusion in the past, plan is to admit the patient after CT scan.  Final diagnoses:  None    ED Discharge Orders     None

## 2024-02-09 NOTE — ED Triage Notes (Signed)
 Pt EMS from [facility] with reports abdominal pain, vaginal itching and pain for the last day. Staff at [facility] noted some vaginal bleeding today. Pt alert and oriented x4.

## 2024-02-09 NOTE — ED Notes (Signed)
 Patient transported to CT

## 2024-02-10 DIAGNOSIS — N1330 Unspecified hydronephrosis: Principal | ICD-10-CM

## 2024-02-10 DIAGNOSIS — E785 Hyperlipidemia, unspecified: Secondary | ICD-10-CM | POA: Diagnosis present

## 2024-02-10 DIAGNOSIS — N390 Urinary tract infection, site not specified: Secondary | ICD-10-CM | POA: Diagnosis not present

## 2024-02-10 DIAGNOSIS — E871 Hypo-osmolality and hyponatremia: Secondary | ICD-10-CM | POA: Diagnosis present

## 2024-02-10 DIAGNOSIS — N1831 Chronic kidney disease, stage 3a: Secondary | ICD-10-CM | POA: Diagnosis present

## 2024-02-10 DIAGNOSIS — F259 Schizoaffective disorder, unspecified: Secondary | ICD-10-CM | POA: Diagnosis present

## 2024-02-10 DIAGNOSIS — N3289 Other specified disorders of bladder: Secondary | ICD-10-CM

## 2024-02-10 DIAGNOSIS — I5032 Chronic diastolic (congestive) heart failure: Secondary | ICD-10-CM | POA: Diagnosis present

## 2024-02-10 DIAGNOSIS — Z96642 Presence of left artificial hip joint: Secondary | ICD-10-CM | POA: Diagnosis present

## 2024-02-10 DIAGNOSIS — D62 Acute posthemorrhagic anemia: Secondary | ICD-10-CM

## 2024-02-10 DIAGNOSIS — Z7902 Long term (current) use of antithrombotics/antiplatelets: Secondary | ICD-10-CM | POA: Diagnosis not present

## 2024-02-10 DIAGNOSIS — I130 Hypertensive heart and chronic kidney disease with heart failure and stage 1 through stage 4 chronic kidney disease, or unspecified chronic kidney disease: Secondary | ICD-10-CM | POA: Diagnosis present

## 2024-02-10 DIAGNOSIS — E872 Acidosis, unspecified: Secondary | ICD-10-CM | POA: Diagnosis present

## 2024-02-10 DIAGNOSIS — N3001 Acute cystitis with hematuria: Secondary | ICD-10-CM | POA: Diagnosis not present

## 2024-02-10 DIAGNOSIS — Z79899 Other long term (current) drug therapy: Secondary | ICD-10-CM | POA: Diagnosis not present

## 2024-02-10 DIAGNOSIS — F17210 Nicotine dependence, cigarettes, uncomplicated: Secondary | ICD-10-CM | POA: Diagnosis present

## 2024-02-10 DIAGNOSIS — Z882 Allergy status to sulfonamides status: Secondary | ICD-10-CM | POA: Diagnosis not present

## 2024-02-10 DIAGNOSIS — N939 Abnormal uterine and vaginal bleeding, unspecified: Secondary | ICD-10-CM | POA: Diagnosis present

## 2024-02-10 DIAGNOSIS — D649 Anemia, unspecified: Secondary | ICD-10-CM | POA: Diagnosis not present

## 2024-02-10 DIAGNOSIS — N179 Acute kidney failure, unspecified: Secondary | ICD-10-CM | POA: Diagnosis present

## 2024-02-10 DIAGNOSIS — B961 Klebsiella pneumoniae [K. pneumoniae] as the cause of diseases classified elsewhere: Secondary | ICD-10-CM | POA: Diagnosis present

## 2024-02-10 DIAGNOSIS — F32A Depression, unspecified: Secondary | ICD-10-CM | POA: Diagnosis present

## 2024-02-10 DIAGNOSIS — Z66 Do not resuscitate: Secondary | ICD-10-CM | POA: Diagnosis present

## 2024-02-10 DIAGNOSIS — N320 Bladder-neck obstruction: Secondary | ICD-10-CM | POA: Diagnosis not present

## 2024-02-10 DIAGNOSIS — E8809 Other disorders of plasma-protein metabolism, not elsewhere classified: Secondary | ICD-10-CM | POA: Diagnosis present

## 2024-02-10 DIAGNOSIS — N136 Pyonephrosis: Secondary | ICD-10-CM | POA: Diagnosis present

## 2024-02-10 DIAGNOSIS — Z1612 Extended spectrum beta lactamase (ESBL) resistance: Secondary | ICD-10-CM | POA: Diagnosis present

## 2024-02-10 DIAGNOSIS — Z8249 Family history of ischemic heart disease and other diseases of the circulatory system: Secondary | ICD-10-CM | POA: Diagnosis not present

## 2024-02-10 DIAGNOSIS — F419 Anxiety disorder, unspecified: Secondary | ICD-10-CM | POA: Diagnosis present

## 2024-02-10 LAB — COMPREHENSIVE METABOLIC PANEL WITH GFR
ALT: 33 U/L (ref 0–44)
AST: 28 U/L (ref 15–41)
Albumin: 2.2 g/dL — ABNORMAL LOW (ref 3.5–5.0)
Alkaline Phosphatase: 140 U/L — ABNORMAL HIGH (ref 38–126)
Anion gap: 8 (ref 5–15)
BUN: 23 mg/dL (ref 8–23)
CO2: 16 mmol/L — ABNORMAL LOW (ref 22–32)
Calcium: 9 mg/dL (ref 8.9–10.3)
Chloride: 107 mmol/L (ref 98–111)
Creatinine, Ser: 1.39 mg/dL — ABNORMAL HIGH (ref 0.44–1.00)
GFR, Estimated: 40 mL/min — ABNORMAL LOW (ref >60)
Glucose, Bld: 144 mg/dL — ABNORMAL HIGH (ref 70–99)
Potassium: 4.7 mmol/L (ref 3.5–5.1)
Sodium: 131 mmol/L — ABNORMAL LOW (ref 135–145)
Total Bilirubin: 0.2 mg/dL (ref 0.0–1.2)
Total Protein: 5.9 g/dL — ABNORMAL LOW (ref 6.5–8.1)

## 2024-02-10 LAB — CBC WITH DIFFERENTIAL/PLATELET
Abs Immature Granulocytes: 1.44 10*3/uL — ABNORMAL HIGH (ref 0.00–0.07)
Basophils Absolute: 0.1 10*3/uL (ref 0.0–0.1)
Basophils Relative: 0 %
Eosinophils Absolute: 0 10*3/uL (ref 0.0–0.5)
Eosinophils Relative: 0 %
HCT: 19.4 % — ABNORMAL LOW (ref 36.0–46.0)
Hemoglobin: 6.3 g/dL — CL (ref 12.0–15.0)
Immature Granulocytes: 3 %
Lymphocytes Relative: 4 %
Lymphs Abs: 1.9 10*3/uL (ref 0.7–4.0)
MCH: 31 pg (ref 26.0–34.0)
MCHC: 32.5 g/dL (ref 30.0–36.0)
MCV: 95.6 fL (ref 80.0–100.0)
Monocytes Absolute: 2.3 10*3/uL — ABNORMAL HIGH (ref 0.1–1.0)
Monocytes Relative: 5 %
Neutro Abs: 42.3 10*3/uL — ABNORMAL HIGH (ref 1.7–7.7)
Neutrophils Relative %: 88 %
Platelets: 417 10*3/uL — ABNORMAL HIGH (ref 150–400)
RBC: 2.03 MIL/uL — ABNORMAL LOW (ref 3.87–5.11)
RDW: 14.6 % (ref 11.5–15.5)
WBC: 48.1 10*3/uL — ABNORMAL HIGH (ref 4.0–10.5)
nRBC: 0 % (ref 0.0–0.2)

## 2024-02-10 LAB — URINALYSIS, W/ REFLEX TO CULTURE (INFECTION SUSPECTED)
Bacteria, UA: NONE SEEN
RBC / HPF: >50 RBC/hpf (ref 0–5)
WBC, UA: >50 WBC/hpf (ref 0–5)

## 2024-02-10 LAB — MAGNESIUM: Magnesium: 1.8 mg/dL (ref 1.7–2.4)

## 2024-02-10 LAB — PREPARE RBC (CROSSMATCH)

## 2024-02-10 LAB — LITHIUM LEVEL: Lithium Lvl: 0.14 mmol/L — ABNORMAL LOW (ref 0.60–1.20)

## 2024-02-10 LAB — RPR: RPR Ser Ql: NONREACTIVE

## 2024-02-10 LAB — PHOSPHORUS: Phosphorus: 3.5 mg/dL (ref 2.5–4.6)

## 2024-02-10 MED ORDER — SODIUM CHLORIDE 0.9% IV SOLUTION: Freq: Once | INTRAVENOUS | Status: AC

## 2024-02-10 MED ORDER — ACETAMINOPHEN 325 MG PO TABS
650.0000 mg | ORAL_TABLET | Freq: Four times a day (QID) | ORAL | Status: DC | PRN
  Administered 2024-02-13 – 2024-02-20 (×8): 650 mg via ORAL
  Filled 2024-02-10 (×8): qty 2

## 2024-02-10 MED ORDER — LORAZEPAM 0.5 MG PO TABS
0.5000 mg | ORAL_TABLET | Freq: Four times a day (QID) | ORAL | Status: DC
  Administered 2024-02-10 – 2024-02-20 (×39): 0.5 mg via ORAL
  Filled 2024-02-10 (×39): qty 1

## 2024-02-10 MED ORDER — METHOCARBAMOL 500 MG PO TABS
500.0000 mg | ORAL_TABLET | Freq: Once | ORAL | Status: AC
  Administered 2024-02-10: 500 mg via ORAL
  Filled 2024-02-10: qty 1

## 2024-02-10 MED ORDER — SENNOSIDES-DOCUSATE SODIUM 8.6-50 MG PO TABS
1.0000 | ORAL_TABLET | Freq: Every evening | ORAL | Status: DC | PRN
  Administered 2024-02-14 – 2024-02-15 (×2): 1 via ORAL
  Filled 2024-02-10 (×2): qty 1

## 2024-02-10 MED ORDER — ATORVASTATIN CALCIUM 40 MG PO TABS
40.0000 mg | ORAL_TABLET | Freq: Every day | ORAL | Status: DC
  Administered 2024-02-10 – 2024-02-12 (×3): 40 mg via ORAL
  Filled 2024-02-10 (×3): qty 1

## 2024-02-10 MED ORDER — ONDANSETRON HCL 4 MG/2ML IJ SOLN: 4.0000 mg | Freq: Four times a day (QID) | INTRAMUSCULAR | Status: DC | PRN

## 2024-02-10 MED ORDER — ACETAMINOPHEN 650 MG RE SUPP: 650.0000 mg | Freq: Four times a day (QID) | RECTAL | Status: DC | PRN

## 2024-02-10 MED ORDER — HYDROMORPHONE HCL 1 MG/ML IJ SOLN
0.5000 mg | Freq: Once | INTRAMUSCULAR | Status: AC
  Administered 2024-02-10: 0.5 mg via INTRAVENOUS
  Filled 2024-02-10: qty 0.5

## 2024-02-10 MED ORDER — BENZTROPINE MESYLATE 0.5 MG PO TABS
1.0000 mg | ORAL_TABLET | Freq: Two times a day (BID) | ORAL | Status: DC
  Administered 2024-02-10 – 2024-02-20 (×21): 1 mg via ORAL
  Filled 2024-02-10 (×21): qty 2

## 2024-02-10 MED ORDER — NICOTINE 7 MG/24HR TD PT24
7.0000 mg | MEDICATED_PATCH | Freq: Every day | TRANSDERMAL | Status: AC
  Administered 2024-02-10: 7 mg via TRANSDERMAL
  Filled 2024-02-10: qty 1

## 2024-02-10 MED ORDER — ONDANSETRON HCL 4 MG PO TABS
4.0000 mg | ORAL_TABLET | Freq: Four times a day (QID) | ORAL | Status: DC | PRN
  Administered 2024-02-20: 4 mg via ORAL
  Filled 2024-02-10: qty 1

## 2024-02-10 MED ORDER — LITHIUM CARBONATE 150 MG PO CAPS
150.0000 mg | ORAL_CAPSULE | Freq: Every day | ORAL | Status: DC
  Administered 2024-02-10 – 2024-02-19 (×11): 150 mg via ORAL
  Filled 2024-02-10 (×11): qty 1

## 2024-02-10 MED ORDER — SERTRALINE HCL 50 MG PO TABS
150.0000 mg | ORAL_TABLET | Freq: Every day | ORAL | Status: DC
  Administered 2024-02-10 – 2024-02-20 (×11): 150 mg via ORAL
  Filled 2024-02-10 (×11): qty 1

## 2024-02-10 MED ORDER — SODIUM CHLORIDE 0.9 % IR SOLN
3000.0000 mL | Status: DC
  Administered 2024-02-10 – 2024-02-12 (×3): 3000 mL

## 2024-02-10 MED ORDER — LACTATED RINGERS IV SOLN: INTRAVENOUS | Status: AC

## 2024-02-10 MED ORDER — SODIUM CHLORIDE 0.9 % IV BOLUS
1000.0000 mL | Freq: Once | INTRAVENOUS | Status: AC
  Administered 2024-02-10: 1000 mL via INTRAVENOUS

## 2024-02-10 MED ORDER — SODIUM CHLORIDE 0.9 % IV SOLN
1.0000 g | INTRAVENOUS | Status: DC
  Administered 2024-02-11 (×2): 1 g via INTRAVENOUS
  Filled 2024-02-10 (×2): qty 10

## 2024-02-10 MED ORDER — LURASIDONE HCL 40 MG PO TABS
120.0000 mg | ORAL_TABLET | Freq: Every day | ORAL | Status: DC
  Administered 2024-02-10 – 2024-02-20 (×11): 120 mg via ORAL
  Filled 2024-02-10 (×12): qty 3

## 2024-02-10 MED ORDER — FLUPHENAZINE HCL 5 MG PO TABS
20.0000 mg | ORAL_TABLET | Freq: Two times a day (BID) | ORAL | Status: DC
  Administered 2024-02-10 – 2024-02-20 (×21): 20 mg via ORAL
  Filled 2024-02-10 (×8): qty 4
  Filled 2024-02-10: qty 8
  Filled 2024-02-10 (×14): qty 4

## 2024-02-10 MED ORDER — HYDROMORPHONE HCL 1 MG/ML IJ SOLN
0.5000 mg | Freq: Four times a day (QID) | INTRAMUSCULAR | Status: DC | PRN
  Administered 2024-02-10 – 2024-02-14 (×4): 0.5 mg via INTRAVENOUS
  Filled 2024-02-10 (×4): qty 0.5

## 2024-02-10 NOTE — Progress Notes (Signed)
 Patient arrived unit, after admission process. Patient said stay away, I don' like you. RN checked with pt, Pt said there always someone talk to her. Per pt stated they hate me.

## 2024-02-10 NOTE — ED Provider Notes (Signed)
 1210 Case d/w Dr. Cherylene Corrente of urology, insert 3 way foley and start irrigation.  [facility] to consult    Foxx Klarich, MD 02/10/24 1610

## 2024-02-10 NOTE — Progress Notes (Addendum)
 Patient arrived to the 1441 with 3 way foley catheter with bloody urine in the bag. RN restarted the irrigation found the leaking from catheter. RN reach the CN, replace the Foley and obtain the order from provider. CBI running fast, RN came in to check the bag found out there is no output into the bag but the bed is wet, and pt sitting in the fluid. RN notified the CN, on-call, and paged the urology. Stop the CBI and waiting for further instruction.

## 2024-02-10 NOTE — H&P (Signed)
 History and Physical  Heidi Blevins WCB:[id number] DOB: 13-Feb-1951 DOA: 02/09/2024  PCP: Pcp, No   Chief Complaint: Lower abdominal pain, vaginal itching/pain and hematuria  HPI: Heidi Blevins is a 73 y.o. female with medical history significant for hypertension, multiple UTIs, CVA, schizoaffective disorder, depression, anxiety, insomnia, CKD 3A, chronic diastolic heart failure, left renal mass and anemia who presented to the ED for evaluation of lower abdominal pain, vaginal itching/pain and hematuria. Patient reports lower abdominal pain since last night. He has also had some dysuria, hematuria and vaginal itching. She endorses some associated nausea, subjective fevers and chills but denies any vomiting, headache, dizziness, chest pain, shortness of breath, bloody stools or falls.   ED Course: Initial vitals show temp 98.8, RR 20, HR 109, BP 139/99, SpO2 96% on room air. Initial labs significant for sodium 131, K+ 4.0, bicarb 17, glucose 198, creatinine 1.09, alk phos 230, AST/ALT 45/48, WBC 20.3, Hgb 8.5, platelet 514. UA too bloody to adequately report but shows WBC >50 and RBC >50. CT A/P shows distended bladder with hemorrhage and mild bilateral hydroureteronephrosis.  Pt received IM Cogentin  1 mg x 1, IV fentanyl  50 mcg x 2, IV Zofran  4 mg x 1, IV Rocephin  2 g x 1 and started on IV LR 125 cc/h.  Urology was consulted for evaluation. [facility] was consulted for admission.   Review of Systems: Please see HPI for pertinent positives and negatives. A complete 10 system review of systems are otherwise negative.  Past Medical History:  Diagnosis Date   Anxiety    Arrhythmia    Bifascicular block    Chronic diastolic heart failure (HCC)    Depression    Hypertension    Insomnia    Schizoaffective disorder    Scoliosis    Thyroid nodule 08/09/2020   Vitamin D  deficiency    Past Surgical History:  Procedure Laterality Date   BACK SURGERY     as a child after she fell out of a tree    TONSILLECTOMY     TOTAL HIP ARTHROPLASTY Left 09/27/2018   Procedure: LEFT TOTAL HIP ARTHROPLASTY ANTERIOR APPROACH;  Surgeon: Wes Hamman, MD;  Location: [facility] OR;  Service: Orthopedics;  Laterality: Left;   Social History:  reports that she has been smoking cigarettes. She has a 10 pack-year smoking history. She has never used smokeless tobacco. She reports that she does not drink alcohol  and does not use drugs.  Allergies  Allergen Reactions   Sulfonamide Derivatives Hives         Family History  Problem Relation Age of Onset   CVA Mother    Hypertension Mother    Lupus Sister    Hypertension Brother    Peptic Ulcer Disease Father    Colon cancer Neg Hx      Prior to Admission medications   Medication Sig Start Date End Date Taking? Authorizing Provider  acetaminophen  (TYLENOL ) 500 MG tablet Take 1,000 mg by mouth in the morning and at bedtime.   Yes [provider]  atorvastatin  (LIPITOR) 40 MG tablet Take 1 tablet (40 mg total) by mouth daily. Patient taking differently: Take 40 mg by mouth at bedtime. 08/13/20  Yes Kraig Peru, MD  benztropine  (COGENTIN ) 1 MG tablet Take 1 mg by mouth 2 (two) times daily.   Yes [provider]  clopidogrel  (PLAVIX ) 75 MG tablet Take 1 tablet (75 mg total) by mouth daily. 08/13/20  Yes Kraig Peru, MD  Cranberry 425 MG  CAPS Take 425 mg by mouth 2 (two) times a day.   Yes [provider]  estradiol (ESTRACE) 0.1 MG/GM vaginal cream Place 1 Applicatorful vaginally. 01/29/24  Yes [provider]  ferrous sulfate (FEROSUL) 325 (65 FE) MG tablet Take 325 mg by mouth daily with breakfast.   Yes [provider]  polyethylene glycol powder (GLYCOLAX /MIRALAX ) 17 GM/SCOOP powder Take 17 g by mouth daily.   Yes [provider]  fluPHENAZine  (PROLIXIN ) 10 MG tablet Take 20 mg by mouth in the morning and at bedtime.    [provider]  hydrocortisone 2.5 % ointment Apply 1 Application  topically daily as needed (dry, itchy skin).    [provider]  ibuprofen  (ADVIL ) 600 MG tablet Take 600 mg by mouth 2 (two) times daily as needed. 06/22/23   [provider]  lisinopril  (ZESTRIL ) 20 MG tablet Take 20 mg by mouth daily. 04/18/22   [provider]  lithium  carbonate 150 MG capsule Take 1 capsule (150 mg total) by mouth at bedtime. 04/25/22 06/29/23  Lesa Rape, MD  loratadine  (CLARITIN ) 10 MG tablet Take 10 mg by mouth daily.    [provider]  LORazepam  (ATIVAN ) 0.5 MG tablet Take 0.5 mg by mouth in the morning, at noon, in the evening, and at bedtime.    [provider]  Lurasidone  HCl (LATUDA ) 120 MG TABS Take 120 mg by mouth daily.    [provider]  melatonin 5 MG TABS Take 5 mg by mouth at bedtime.    [provider]  metoprolol  tartrate (LOPRESSOR ) 25 MG tablet Take 25 mg by mouth 2 (two) times daily. 06/13/23   [provider]  Multiple Vitamins-Minerals (MULTIVITAMINS THER. W/MINERALS) TABS tablet Take 1 tablet by mouth daily.    [provider]  Pramoxine HCl (VAGISIL ANTI-ITCH MEDICATED) 1 % MISC Apply vaginally once a day Apply 1 application topically daily as needed (for vaginal itching). Apply vaginally once a day Patient taking differently: Apply 1 application  topically daily as needed (for vaginal itching). 03/25/19   Armenta Landau, MD  Propylene Glycol (SYSTANE BALANCE OP) Place 1 drop into both eyes 3 (three) times daily.    [provider]  sertraline  (ZOLOFT ) 100 MG tablet Take 150 mg by mouth daily.    [provider]  simethicone  (MYLICON) 80 MG chewable tablet Chew 1 tablet (80 mg total) by mouth 4 (four) times daily. 08/13/20   Kraig Peru, MD    Physical Exam: BP (!) 136/93   Pulse (!) 120   Temp 98.2 F (36.8 C) (Oral)   Resp 18   Wt 49.9 kg   SpO2 100%   BMI 18.30 kg/m  General: Pleasant, acutely ill-appearing elderly woman laying in bed. No  acute distress. HEENT: [place]/AT. Anicteric sclera.  Dry mucous membrane CV: Tachycardic.  Regular rhythm. No murmurs, rubs, or gallops. No LE edema Pulmonary: Lungs CTAB. Normal effort. No wheezing or rales. Abdominal: Moderate distention. Mild ttp of the lower abdomen.  Normal bowel sounds. Extremities: Palpable radial and DP pulses. Normal ROM. Skin: Warm and dry. No obvious rash or lesions. Decreased skin turgor. GU: Indwelling catheter in place draining bloody urine in bag Neuro: A&Ox3. Moves all extremities. Normal sensation to light touch. No focal deficit. Psych: Normal mood and affect          Labs on Admission:  Basic Metabolic Panel: Recent Labs  Lab 02/09/24 2029  NA 131*  K 4.0  CL  104  CO2 17*  GLUCOSE 198*  BUN 23  CREATININE 1.09*  CALCIUM  9.5   Liver Function Tests: Recent Labs  Lab 02/09/24 2029  AST 45*  ALT 48*  ALKPHOS 230*  BILITOT 0.2  PROT 7.7  ALBUMIN 2.8*   No results for input(s): LIPASE, AMYLASE in the last 168 hours. No results for input(s): AMMONIA in the last 168 hours. CBC: Recent Labs  Lab 02/09/24 2029  WBC 20.3*  NEUTROABS 16.7*  HGB 8.5*  HCT 27.5*  MCV 95.8  PLT 514*   Cardiac Enzymes: No results for input(s): CKTOTAL, CKMB, CKMBINDEX, TROPONINI in the last 168 hours. BNP (last 3 results) No results for input(s): BNP in the last 8760 hours.  ProBNP (last 3 results) No results for input(s): PROBNP in the last 8760 hours.  CBG: No results for input(s): GLUCAP in the last 168 hours.  Radiological Exams on Admission: CT ABDOMEN PELVIS W CONTRAST Result Date: 02/09/2024 EXAM: CT ABDOMEN AND PELVIS WITH CONTRAST 02/09/2024 10:47:56 PM TECHNIQUE: CT of the abdomen and pelvis was performed with the administration of intravenous contrast. Multiplanar reformatted images are provided for review. Automated exposure control, iterative reconstruction, and/or weight based adjustment of the mA/kV was utilized to  reduce the radiation dose to as low as reasonably achievable. COMPARISON: 09/24/2023 CLINICAL HISTORY: Vaginal bleeding. Pt EMS from [facility] with reports abdominal pain, vaginal itching and pain for the last day. Staff at [facility] noted some vaginal bleeding today. Pt alert and oriented X 4. FINDINGS: LOWER CHEST: No acute abnormality. LIVER: The liver is unremarkable. GALLBLADDER AND BILE DUCTS: Gallbladder is unremarkable. No biliary ductal dilatation. SPLEEN: No acute abnormality. PANCREAS: No acute abnormality. ADRENAL GLANDS: No acute abnormality. KIDNEYS, URETERS AND BLADDER: Mild bilateral hydroureteronephrosis extending to the UVJ, suggesting sequela of bladder outlet obstruction. Distended bladder with hemorrhage. No stones in the kidneys or ureters. No perinephric or periureteral stranding. GI AND BOWEL: Normal appendix (image 57). Stomach demonstrates no acute abnormality. There is no bowel obstruction. No bowel wall thickening. PERITONEUM AND RETROPERITONEUM: No ascites. No free air. VASCULATURE: Atherosclerotic calcifications of the abdominal aorta and branch vessels, although patent. LYMPH NODES: No lymphadenopathy. REPRODUCTIVE ORGANS: No acute abnormality. BONES AND SOFT TISSUES: Old bilateral sacral and right pelvic fractures. Left hip arthroplasty. S-shaped thoracolumbar scoliosis with multilevel degenerative changes, including mild inferior endplate changes at T10 which are chronic. No acute osseous abnormality. No focal soft tissue abnormality. IMPRESSION: 1. Distended bladder with hemorrhage. 2. Mild bilateral hydroureteronephrosis extending to the UVJ, suggesting sequela of bladder obstruction. Electronically signed by: Zadie Herter MD 02/09/2024 11:24 PM EDT RP Workstation: [id number]   Assessment/Plan RAFFAELLA EDISON is a 73 y.o. female with medical history significant for hypertension, CVA, multiple UTIs, schizoaffective disorder, depression, anxiety, insomnia, CKD 3A,  chronic diastolic heart failure, left renal mass and anemia who presented to the ED for evaluation of lower abdominal pain, vaginal itching/pain and hematuria and admitted for bladder obstruction and bilateral hydroureteronephrosis.   # Bladder obstruction # Bilateral hydroureteronephrosis - Patient with recently diagnosed left renal mass concerning for RCC presenting with 1 day of hematuria and lower abdominal pain - Found to have abdominal distention, lower abdominal pain and gross hematuria on admission - CT A/P shows distended bladder with hemorrhage as well as mild bilateral hydroureteronephrosis extending to the UVJ - Urology consulted, recommended three-way Foley catheter and bladder irrigation, this was completed by EDP - Continue Foley catheter - Continue IV LR 125 cc/h - Pain control with  PRN IV Dilaudid   # Acute cystitis - Patient with reported dysuria, lower abdominal pain and vaginal pain - Urine to turbid for adequate report but shows significantly elevated white count - Continue IV Rocephin  - Follow-up urine culture  # Left renal mass - Patient found to have a 2.6 x 3.0 cm left renal mass on imaging earlier this year - Followed by oncology and urology - Urology following, appreciate recs  # Anemia - Currently followed by oncology with no identifiable cause - Hgb of 8.5 on admission only slight drop from baseline of 9-10 - Recent iron studies by oncologist shows possible anemia of chronic disease/inflammation - Trend CBC - Transfuse for Hgb >7  # HTN - BP initially elevated on admission, currently soft but SBP in the 90s to 100s after bladder irrigation - Hold BP meds for now  # Anxiety and depression # Schizoaffective disorder - Lithium  level subtherapeutic at 0.14 - Continue lithium , benztropine , lurasidone , fluphenazine  - Continue sertraline  and lorazepam   # Hx of CVA # HLD - Continue atorvastatin  - Hold Plavix  for now in the setting of active bleed  #  Home meds resumed based on recent refills.  Pharmacy tech unable to reach ALF to complete med rec. Please verify home meds and adjust as appropriate after completion of med rec  DVT prophylaxis: SCDs    Code Status: Limited: Do not attempt resuscitation (DNR) -DNR-LIMITED -Do Not Intubate/DNI   Consults called: Urology  Family Communication: No family at bedside  Severity of Illness: The appropriate patient status for this patient is INPATIENT. Inpatient status is judged to be reasonable and necessary in order to provide the required intensity of service to ensure the patient's safety. The patient's presenting symptoms, physical exam findings, and initial radiographic and laboratory data in the context of their chronic comorbidities is felt to place them at high risk for further clinical deterioration. Furthermore, it is not anticipated that the patient will be medically stable for discharge from the hospital within 2 midnights of admission.   * I certify that at the point of admission it is my clinical judgment that the patient will require inpatient hospital care spanning beyond 2 midnights from the point of admission due to high intensity of service, high risk for further deterioration and high frequency of surveillance required.*  Level of care: Telemetry   This record has been created using Conservation officer, historic buildings. Errors have been sought and corrected, but may not always be located. Such creation errors do not reflect on the standard of care.   Vita Grip, MD 02/10/2024, 12:24 AM [facility] Pager: [phone] Isaiah 41:10   If 7PM-7AM, please contact night-coverage [email or web address] Password [email or web address]

## 2024-02-10 NOTE — Progress Notes (Signed)
 Care started prior to midnight in the emergency room and patient was admitted early this morning after midnight by Dr. Redge Cancel.  Additional changes to the plan of care been made accordingly.  The patient is a 73 year old African-American female with past medical history significant for Mannam to essential hypertension, multiple UTIs, history of CVA, history of schizoaffective disorder, depression and anxiety, insomnia, CKD stage IIIa, chronic diastolic CHF, history of left renal mass with anemia and other comorbidities who presented to the ED for evaluation of lower abdominal pain, vaginal itching and pain as well as hematuria.  She started having abdominal pain last night and had some dysuria, hematuria and vaginal itching and had some associated nausea, fevers and chills.  Further workup was done and UA was too bloody to report adequately but showed greater than 50 WBCs.  CT of the abdomen pelvis was done and showed distended bladder with hemorrhage and mild bilateral hydroureteronephrosis.  Received IM Cogentin  1 mg, fentanyl , Zofran , Rocephin  and urology was consulted and she was placed on IV fluid hydration.  Since last night her blood count has dropped and she has been to be typed and screened and transfused 2 units of PRBCs.  Currently she is being admitted for and treated for the following but not limited to:  Bladder obstruction and Bilateral hydroureteronephrosis  - Patient with recently diagnosed left renal mass concerning for RCC presenting with 1 day of hematuria and lower abdominal pain - Found to have abdominal distention, lower abdominal pain and gross hematuria on admission - CT A/P shows distended bladder with hemorrhage as well as mild bilateral hydroureteronephrosis extending to the UVJ - Urology consulted, recommended three-way Foley catheter and bladder irrigation, this was completed by EDP - Continue Foley catheter drainage and this was block so urology had to evacuate and  evacuated quite a bit of blood.  Given her drop in hemoglobin we will type and screen and transfuse to units PRBCs - Continu likely in the setting of left renal mass concerning for RCC e IV LR 125 cc/h - Pain control with PRN IV Dilaudid    Acute Cystitis versus pyelonephritis - Patient with reported dysuria, lower abdominal pain and vaginal pain - Urine to turbid for adequate report but shows significantly elevated white count; BC has worsened and is now gone from 20.3 is now 48.1 - Continue IV ceftriaxone  - Follow-up urine culture   Left Renal Mass - Patient found to have a 2.6 x 3.0 cm left renal mass on imaging earlier this year - Followed by oncology and urology - Urology following, appreciate recs   Acute blood loss anemia - Currently followed by oncology with no identifiable cause - Hgb of 8.5 on admission only slight drop from baseline of 9-10; now hemoglobin 6.3 and there is quite a bit of blood in her bladder that was irrigated - Recent iron studies by oncologist shows possible anemia of chronic disease/inflammation - Trend CBC - Transfuse for Hgb >7 and she is being typed and screened and transfused 2 units PRBCs  AKI on CKD Stage 3a: BUN/Cr Trend: Recent Labs  Lab 02/09/24 2029 02/10/24 0956  BUN 23 23  CREATININE 1.09* 1.39*  -Avoid Nephrotoxic Medications, Contrast Dyes, Hypotension and Dehydration to Ensure Adequate Renal Perfusion and will need to Renally Adjust Meds. CTM and Trend Renal Function carefully and repeat CMP in the AM    Essential HTN - BP initially elevated on admission, currently soft but SBP in the 90s to 100s after bladder irrigation -  Hold BP meds for now   Anxiety and depression Schizoaffective disorder - Lithium  level subtherapeutic at 0.14 - Continue lithium , benztropine , lurasidone , fluphenazine  - Continue sertraline  and lorazepam    Hx of CVA HLD - Continue atorvastatin  - Hold Plavix  for now in the setting of active  bleed  Hypoalbuminemia: Patient's Albumin Level went from 2.8 -> 2.2. CTM and Trend and Repeat CMP in the AM

## 2024-02-10 NOTE — Progress Notes (Signed)
 Pt had foley exchanged to 24 [place] by Dr. Cherylene Corrente, this RN assisted. Pt also has critical hemoglobin of 6.3, MD was notified and order for 2 units PRBC's ordered. Type and screen attempted to be done around 2pm, the sample was not enough to test per phlebotomy and a redraw was performed right before 4pm to see if they could get adequate sample. Results came back as O-positive, awaiting for blood to be ready to infuse to pt. Will call to see to see progress of having blood ready for pick up, as call has not been received yet to this RN. Set up supplies in room and VS taken. Son- Polly Brink- signed on the behalf of pt for blood transfusion when he arrived right before 6p due to pt being lethargic and unable to physically sign, she gave verbal consent to MD, this RN, and son to sign. Will continue to monitor

## 2024-02-10 NOTE — Progress Notes (Signed)
 OT Cancellation Note  Patient Details Name: [name] MRN: [id number] DOB: 01/06/1951   Cancelled Treatment:    Reason Eval/Treat Not Completed: Medical issues which prohibited therapy.  HGB 6.3 and currently receiving blood products.  Will continue efforts next date.    Ankush Gintz D Tigerlily Christine 02/10/2024, 3:12 PM 02/10/2024  RP, OTR/L  Acute Rehabilitation Services  Office:  [phone]

## 2024-02-10 NOTE — Consult Note (Signed)
 Urology Consult  Requesting physician: Scarlette Currier, MD  Reason for consultation: Gross hematuria with clot retention   Assessment/Recommendations:  1.  Gross hematuria, undetermined etiology No evidence of renal bleeding on CT A 24 [place] three-way hematuria catheter was placed and an extensive amount of clot was removed from the bladder with manual irrigation.  After 3 L of irrigation the effluent was clear and remained clear-faint pink tinged on moderate CBI Titrate CBI to keep effluent clear-pink tinged Will need cystoscopy for further evaluation Will follow  2.  Bilateral hydronephrosis/hydroureter Secondary to urinary retention Should resolve with catheter drainage  3.  Left renal mass Mass not well-visualized on admission CT Follow-up imaging was planned next month   History of Present Illness: Heidi Blevins is a 73 y.o. female with past medical history significant for hypertension, CVA, CKD, chronic diastolic heart failure, schizoaffective disorder who resides at an SNF.  The history was obtained from her son and chart review.  Noted at her SNF yesterday to have vaginal bleeding and lower abdominal pain Evaluation in ED remarkable for Hgb 8.5; grossly bloody urine with >50 RBC/WBC on microscopy; urine culture pending; creatinine 1.09 CT abdomen pelvis with contrast remarkable for bladder distention with large amount of hyperdense material consistent with clot.  Mild bilateral hydronephrosis/hydroureter to UVJ A three-way Foley catheter was placed in the ED and the bladder was irrigated and she was placed on CBI and was apparently doing well until the catheter clotted off this morning.  It was changed to a 24 three-way and the catheter could not be irrigated. Hbg dropped to 6.3 this a.m. and transfusion ordered by hospitalist Is on Plavix  which has been held No prior episodes of significant hematuria   Incidentally noted to have a 2.2 cm vague, enhancing left  lower pole renal mass January 2025 She saw Dr. Cathi Cluster for mass evaluation and recurrent UTI After discussing management options with her son active surveillance was elected with plan of reimaging next month.      Past Medical History:  Diagnosis Date   Anxiety    Arrhythmia    Bifascicular block    Chronic diastolic heart failure (HCC)    Depression    Hypertension    Insomnia    Schizoaffective disorder    Scoliosis    Thyroid nodule 08/09/2020   Vitamin D  deficiency     Past Surgical History:  Procedure Laterality Date   BACK SURGERY     as a child after she fell out of a tree   TONSILLECTOMY     TOTAL HIP ARTHROPLASTY Left 09/27/2018   Procedure: LEFT TOTAL HIP ARTHROPLASTY ANTERIOR APPROACH;  Surgeon: Wes Hamman, MD;  Location: [facility] OR;  Service: Orthopedics;  Laterality: Left;    Home Medications:  Current Meds  Medication Sig   acetaminophen  (TYLENOL ) 500 MG tablet Take 1,000 mg by mouth in the morning and at bedtime.   atorvastatin  (LIPITOR) 40 MG tablet Take 1 tablet (40 mg total) by mouth daily. (Patient taking differently: Take 40 mg by mouth at bedtime.)   benztropine  (COGENTIN ) 1 MG tablet Take 1 mg by mouth 2 (two) times daily.   clopidogrel  (PLAVIX ) 75 MG tablet Take 1 tablet (75 mg total) by mouth daily.   Cranberry 425 MG CAPS Take 425 mg by mouth 2 (two) times a day.   estradiol (ESTRACE) 0.1 MG/GM vaginal cream Place 1 Applicatorful vaginally. TWICE PER WEEK   ferrous sulfate (FEROSUL) 325 (65 FE) MG tablet Take  325 mg by mouth daily with breakfast.   fluPHENAZine  (PROLIXIN ) 10 MG tablet Take 20 mg by mouth in the morning and at bedtime.   ibuprofen  (ADVIL ) 600 MG tablet Take 600 mg by mouth 2 (two) times daily as needed.   lidocaine  (LIDODERM ) 5 % Place 1-3 patches onto the skin daily. NEED SITE(S) SPECIFIED, FREQUENCY CONFIRMED AND NEED PARAMETERS FOR NUMBER OF PATCHES 12 HRS ON 12 HRS OFF   lisinopril  (ZESTRIL ) 20 MG tablet Take 20 mg by mouth  daily.   lithium  carbonate 150 MG capsule Take 1 capsule (150 mg total) by mouth at bedtime.   loratadine  (CLARITIN ) 10 MG tablet Take 10 mg by mouth daily.   LORazepam  (ATIVAN ) 0.5 MG tablet Take 0.5 mg by mouth in the morning, at noon, in the evening, and at bedtime.   Lurasidone  HCl (LATUDA ) 120 MG TABS Take 120 mg by mouth daily.   melatonin 5 MG TABS Take 5 mg by mouth at bedtime.   Menthol , Topical Analgesic, (BENGAY VANISHING SCENT) 2.5 % GEL Apply topically 3 (three) times daily as needed (BACK PAIN). APPLY TO LOWER BACK THREE TIMES DAILY AS NEEDED FOR BACK PAIN   metoprolol  tartrate (LOPRESSOR ) 25 MG tablet Take 25 mg by mouth 2 (two) times daily.   Multiple Vitamins-Minerals (MULTIVITAMINS THER. W/MINERALS) TABS tablet Take 1 tablet by mouth daily.   polyethylene glycol powder (GLYCOLAX /MIRALAX ) 17 GM/SCOOP powder Take 17 g by mouth daily.   Pramoxine HCl (VAGISIL ANTI-ITCH MEDICATED) 1 % MISC Apply vaginally once a day Apply 1 application topically daily as needed (for vaginal itching). Apply vaginally once a day (Patient taking differently: Apply 1 application  topically daily as needed (for vaginal itching).)   Propylene Glycol (SYSTANE BALANCE OP) Place 1 drop into both eyes 3 (three) times daily.   sertraline  (ZOLOFT ) 100 MG tablet Take 150 mg by mouth daily.   simethicone  (MYLICON) 80 MG chewable tablet Chew 1 tablet (80 mg total) by mouth 4 (four) times daily.    Allergies:  Allergies  Allergen Reactions   Sulfonamide Derivatives Hives         Family History  Problem Relation Age of Onset   CVA Mother    Hypertension Mother    Lupus Sister    Hypertension Brother    Peptic Ulcer Disease Father    Colon cancer Neg Hx     Social History:  reports that she has been smoking cigarettes. She has a 10 pack-year smoking history. She has never used smokeless tobacco. She reports that she does not drink alcohol  and does not use drugs.  ROS: Not obtainable  Physical Exam:   Vital signs in last 24 hours: Temp:  [98.2 F (36.8 C)-98.9 F (37.2 C)] 98.5 F (36.9 C) (06/15 0900) Pulse Rate:  [103-120] 115 (06/15 0900) Resp:  [18-20] 18 (06/15 0348) BP: (94-179)/(63-102) 110/69 (06/15 0900) SpO2:  [96 %-100 %] 99 % (06/15 0900) Weight:  [49.8 kg-49.9 kg] 49.8 kg (06/15 0356) Constitutional:  Alert, answers yes/no, no acute distress HEENT: [place] AT, moist mucus membranes.  Trachea midline, no masses Respiratory: Normal respiratory effort GI: Abdomen is soft, bladder distended GU: No CVA tenderness   Laboratory Data:  Recent Labs    02/09/24 2029 02/10/24 0956  WBC 20.3* 48.1*  HGB 8.5* 6.3*  HCT 27.5* 19.4*   Recent Labs    02/09/24 2029 02/10/24 0956  NA 131* 131*  K 4.0 4.7  CL 104 107  CO2 17* 16*  GLUCOSE 198*  144*  BUN 23 23  CREATININE 1.09* 1.39*  CALCIUM  9.5 9.0     Radiologic Imaging: CT images were personally reviewed and interpreted  CT ABDOMEN PELVIS W CONTRAST Result Date: 02/09/2024 EXAM: CT ABDOMEN AND PELVIS WITH CONTRAST 02/09/2024 10:47:56 PM TECHNIQUE: CT of the abdomen and pelvis was performed with the administration of intravenous contrast. Multiplanar reformatted images are provided for review. Automated exposure control, iterative reconstruction, and/or weight based adjustment of the mA/kV was utilized to reduce the radiation dose to as low as reasonably achievable. COMPARISON: 09/24/2023 CLINICAL HISTORY: Vaginal bleeding. Pt EMS from [facility] with reports abdominal pain, vaginal itching and pain for the last day. Staff at [facility] noted some vaginal bleeding today. Pt alert and oriented X 4. FINDINGS: LOWER CHEST: No acute abnormality. LIVER: The liver is unremarkable. GALLBLADDER AND BILE DUCTS: Gallbladder is unremarkable. No biliary ductal dilatation. SPLEEN: No acute abnormality. PANCREAS: No acute abnormality. ADRENAL GLANDS: No acute abnormality. KIDNEYS, URETERS AND BLADDER: Mild bilateral  hydroureteronephrosis extending to the UVJ, suggesting sequela of bladder outlet obstruction. Distended bladder with hemorrhage. No stones in the kidneys or ureters. No perinephric or periureteral stranding. GI AND BOWEL: Normal appendix (image 57). Stomach demonstrates no acute abnormality. There is no bowel obstruction. No bowel wall thickening. PERITONEUM AND RETROPERITONEUM: No ascites. No free air. VASCULATURE: Atherosclerotic calcifications of the abdominal aorta and branch vessels, although patent. LYMPH NODES: No lymphadenopathy. REPRODUCTIVE ORGANS: No acute abnormality. BONES AND SOFT TISSUES: Old bilateral sacral and right pelvic fractures. Left hip arthroplasty. S-shaped thoracolumbar scoliosis with multilevel degenerative changes, including mild inferior endplate changes at T10 which are chronic. No acute osseous abnormality. No focal soft tissue abnormality. IMPRESSION: 1. Distended bladder with hemorrhage. 2. Mild bilateral hydroureteronephrosis extending to the UVJ, suggesting sequela of bladder obstruction. Electronically signed by: Zadie Herter MD 02/09/2024 11:24 PM EDT RP Workstation: [id number]     02/10/2024, 1:58 PM  Darlynn Elam,  MD

## 2024-02-10 NOTE — ED Notes (Signed)
 Bladder irrigation completed, PT tolerated well, urine clear on completion.  1100 ml used

## 2024-02-10 NOTE — Plan of Care (Signed)

## 2024-02-11 DIAGNOSIS — N1330 Unspecified hydronephrosis: Secondary | ICD-10-CM | POA: Diagnosis not present

## 2024-02-11 DIAGNOSIS — D62 Acute posthemorrhagic anemia: Secondary | ICD-10-CM | POA: Diagnosis not present

## 2024-02-11 DIAGNOSIS — N320 Bladder-neck obstruction: Secondary | ICD-10-CM | POA: Diagnosis not present

## 2024-02-11 DIAGNOSIS — N3289 Other specified disorders of bladder: Secondary | ICD-10-CM | POA: Diagnosis not present

## 2024-02-11 LAB — BPAM RBC
Blood Product Expiration Date: 202506292359
Blood Product Expiration Date: 202507052359
ISSUE DATE / TIME: 202506151852
ISSUE DATE / TIME: 202506152240
Unit Type and Rh: 5100
Unit Type and Rh: 5100

## 2024-02-11 LAB — CBC WITH DIFFERENTIAL/PLATELET
Abs Immature Granulocytes: 0.79 10*3/uL — ABNORMAL HIGH (ref 0.00–0.07)
Basophils Absolute: 0.1 10*3/uL (ref 0.0–0.1)
Basophils Relative: 0 %
Eosinophils Absolute: 0 10*3/uL (ref 0.0–0.5)
Eosinophils Relative: 0 %
HCT: 25.8 % — ABNORMAL LOW (ref 36.0–46.0)
Hemoglobin: 8.6 g/dL — ABNORMAL LOW (ref 12.0–15.0)
Immature Granulocytes: 2 %
Lymphocytes Relative: 3 %
Lymphs Abs: 1 10*3/uL (ref 0.7–4.0)
MCH: 29.9 pg (ref 26.0–34.0)
MCHC: 33.3 g/dL (ref 30.0–36.0)
MCV: 89.6 fL (ref 80.0–100.0)
Monocytes Absolute: 1.6 10*3/uL — ABNORMAL HIGH (ref 0.1–1.0)
Monocytes Relative: 5 %
Neutro Abs: 30.5 10*3/uL — ABNORMAL HIGH (ref 1.7–7.7)
Neutrophils Relative %: 90 %
Platelets: 379 10*3/uL (ref 150–400)
RBC: 2.88 MIL/uL — ABNORMAL LOW (ref 3.87–5.11)
RDW: 16.5 % — ABNORMAL HIGH (ref 11.5–15.5)
WBC: 34 10*3/uL — ABNORMAL HIGH (ref 4.0–10.5)
nRBC: 0 % (ref 0.0–0.2)

## 2024-02-11 LAB — COMPREHENSIVE METABOLIC PANEL WITH GFR
ALT: 35 U/L (ref 0–44)
AST: 41 U/L (ref 15–41)
Albumin: 2.1 g/dL — ABNORMAL LOW (ref 3.5–5.0)
Alkaline Phosphatase: 149 U/L — ABNORMAL HIGH (ref 38–126)
Anion gap: 9 (ref 5–15)
BUN: 26 mg/dL — ABNORMAL HIGH (ref 8–23)
CO2: 16 mmol/L — ABNORMAL LOW (ref 22–32)
Calcium: 9.3 mg/dL (ref 8.9–10.3)
Chloride: 112 mmol/L — ABNORMAL HIGH (ref 98–111)
Creatinine, Ser: 1.27 mg/dL — ABNORMAL HIGH (ref 0.44–1.00)
GFR, Estimated: 45 mL/min — ABNORMAL LOW (ref >60)
Glucose, Bld: 117 mg/dL — ABNORMAL HIGH (ref 70–99)
Potassium: 4.1 mmol/L (ref 3.5–5.1)
Sodium: 137 mmol/L (ref 135–145)
Total Bilirubin: 0.5 mg/dL (ref 0.0–1.2)
Total Protein: 5.8 g/dL — ABNORMAL LOW (ref 6.5–8.1)

## 2024-02-11 LAB — TYPE AND SCREEN
ABO/RH(D): O POS
Antibody Screen: POSITIVE
DAT, IgG: NEGATIVE
Donor AG Type: NEGATIVE
Donor AG Type: NEGATIVE
Unit division: 0
Unit division: 0

## 2024-02-11 LAB — PHOSPHORUS: Phosphorus: 3.4 mg/dL (ref 2.5–4.6)

## 2024-02-11 LAB — MAGNESIUM: Magnesium: 2 mg/dL (ref 1.7–2.4)

## 2024-02-11 NOTE — TOC Initial Note (Signed)
 Transition of Care [facility]) - Initial/Assessment Note    Patient Details  Name: Heidi Blevins MRN: [id number] Date of Birth: September 22, 1950  [facility] CM/SW Contact:    Gertha Ku, LCSW Phone Number: 02/11/2024, 12:14 PM  Clinical Narrative:                  Pt has rec for PT/OT eval, TOC to follow for recommendations.     Barriers to Discharge: Continued Medical Work up   Patient Goals and CMS Choice            Expected Discharge Plan and Services                                              Prior Living Arrangements/Services                       Activities of Daily Living   ADL Screening (condition at time of admission) Independently performs ADLs?: No Does the patient have a NEW difficulty with bathing/dressing/toileting/self-feeding that is expected to last >3 days?: Yes (Initiates electronic notice to provider for possible OT consult) Does the patient have a NEW difficulty with getting in/out of bed, walking, or climbing stairs that is expected to last >3 days?: Yes (Initiates electronic notice to provider for possible PT consult) Does the patient have a NEW difficulty with communication that is expected to last >3 days?: No Is the patient deaf or have difficulty hearing?: No Does the patient have difficulty seeing, even when wearing glasses/contacts?: No Does the patient have difficulty concentrating, remembering, or making decisions?: No  Permission Sought/Granted                  Emotional Assessment              Admission diagnosis:  Hydroureteronephrosis [N13.30] Bladder hemorrhage [N32.89] Bladder obstruction [N32.0] Anemia, unspecified type [D64.9] Patient Active Problem List   Diagnosis Date Noted   Bladder obstruction 02/10/2024   Hydroureteronephrosis 02/10/2024   Anemia 02/10/2024   Bladder hemorrhage 02/10/2024   ABLA (acute blood loss anemia) 02/10/2024   CKD stage 3a, GFR 45-59  ml/min (HCC) 09/23/2023   Pericardial effusion 09/23/2023   Kidney lesion, native, left 09/23/2023   Acute cystitis 04/23/2022   Chronic diastolic CHF (congestive heart failure) (HCC) 04/23/2022   GAD (generalized anxiety disorder) 04/23/2022   HLD (hyperlipidemia) 04/23/2022   Polypharmacy 08/09/2020   Acute CVA (cerebrovascular accident) (HCC) 08/09/2020   Thyroid nodule 08/09/2020   History of CVA (cerebrovascular accident) 08/08/2020   Bradycardia    Acute metabolic encephalopathy    Sepsis secondary to UTI (HCC) 03/20/2019   Closed displaced fracture of left femoral neck (HCC) 09/26/2018   AKI (acute kidney injury) (HCC) 06/13/2016   UTI (urinary tract infection) 06/13/2016   Essential hypertension 06/13/2016   Urinary tract infection without hematuria    Complicated UTI (urinary tract infection) 05/03/2016   Hypercalcemia 05/03/2016   Dehydration 05/03/2016   Acute encephalopathy 05/03/2016   Cerebrovascular disease 05/03/2016   Schizoaffective disorder (HCC) 05/03/2016   Bifascicular block 05/03/2016   PCP:  Pcp, No Pharmacy:   [facility] [address], [address] [place] [address] Phone: [phone] Fax: [phone]     Social Drivers of Health (SDOH) Social History: SDOH Screenings   Food Insecurity:  No Food Insecurity (02/10/2024)  Housing: Low Risk  (02/10/2024)  Transportation Needs: No Transportation Needs (02/10/2024)  Utilities: Not At Risk (02/10/2024)  Social Connections: Moderately Integrated (02/10/2024)  Tobacco Use: High Risk (02/09/2024)   SDOH Interventions:     Readmission Risk Interventions    09/24/2023   12:58 PM  Readmission Risk Prevention Plan  Post Dischage Appt Complete  Medication Screening Complete  Transportation Screening Complete

## 2024-02-11 NOTE — Hospital Course (Addendum)
 The patient is a 73 year old African-American female with past medical history significant for Mannam to essential hypertension, multiple UTIs, history of CVA, history of schizoaffective disorder, depression and anxiety, insomnia, CKD stage IIIa, chronic diastolic CHF, history of left renal mass with anemia and other comorbidities who presented to the ED for evaluation of lower abdominal pain, vaginal itching and pain as well as hematuria.  She started having abdominal pain last night and had some dysuria, hematuria and vaginal itching and had some associated nausea, fevers and chills.  Further workup was done and UA was too bloody to report adequately but showed greater than 50 WBCs.  CT of the abdomen pelvis was done and showed distended bladder with hemorrhage and mild bilateral hydroureteronephrosis.  Received IM Cogentin  1 mg, fentanyl , Zofran , Rocephin  and urology was consulted and she was placed on IV fluid hydration.   Since last night her blood count has dropped and she has been to be typed and screened and transfused 2 units of PRBCs.  After the 2 units of blood count is improved and is stable at 8.6.  She continues to have a metabolic acidosis and CBC remains elevated but is trending down.  Assessment and Plan:  Bladder obstruction and Bilateral hydroureteronephrosis  - Patient with recently diagnosed left renal mass concerning for RCC presenting with 1 day of hematuria and lower abdominal pain - Found to have abdominal distention, lower abdominal pain and gross hematuria on admission - CT A/P shows distended bladder with hemorrhage as well as mild bilateral hydroureteronephrosis extending to the UVJ - Urology consulted, recommended three-way Foley catheter and bladder irrigation, this was completed by EDP; urology recommends continuing catheter drainage - Continue Foley catheter drainage and this was blocked so urology had to evacuate and evacuated quite a bit of blood.  Given her drop in  hemoglobin we will type and screen and transfuse to units PRBCs - Currently her symptoms are likely in the setting of left renal mass concerning for RCC; she was given IV fluids with IV LR 125 cc/hr and resumed at 75 mL/h for 1 day and now stopped -Pain control with PRN IV Dilaudid  -PT/OT to Evaluate and recommending Home Health    Acute Cystitis versus pyelonephritis - Patient with reported dysuria, lower abdominal pain and vaginal pain - Urine to turbid for adequate report but shows significantly elevated white count; BC has worsened and is now gone from 20.3 is now 48.1 and is now slowly trending down - Continue IV ceftriaxone  - Follow-up urine culture showing Klebsiella   Left Renal Mass: Patient found to have a 2.6 x 3.0 cm left renal mass on imaging earlier this year. Followed by oncology and urology.  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  Continue to monitor for signs or symptoms of bleeding and repeat CBC in a.m.   AKI on CKD Stage 3a/ Metabolic Acidosis: BUN/Cr Trend: Recent Labs  Lab 02/09/24 2029 02/10/24 0956 02/11/24 0958  BUN  23 23 26*  CREATININE 1.09* 1.39* 1.27*  -Patient has a metabolic acidosis with a CO2 of 16, anion gap of 9, chloride level of 112 -Avoid Nephrotoxic Medications, Contrast Dyes, Hypotension and Dehydration to Ensure Adequate Renal Perfusion and will need to Renally Adjust Meds. CTM and Trend Renal Function  carefully and repeat CMP in the AM    Essential HTN: BP initially elevated on admission, currently soft but SBP in the 90s to 100s after bladder irrigation. Continue to Hold BP meds for now.  Continue monitor blood pressures per protocol.  Last blood pressure reading was 126/70   Anxiety and depression Schizoaffective disorder - Lithium  level subtherapeutic at 0.14 - Continue lithium , benztropine , lurasidone , fluphenazine  - Continue sertraline  and lorazepam    Hx of CVA HLD - Continue atorvastatin  - Hold Plavix  for now in the setting of active bleed   Hypoalbuminemia: Patient's Albumin Level went from 2.8 -> 2.2 -> 2.1. CTM and Trend and Repeat CMP in the AM

## 2024-02-11 NOTE — Evaluation (Signed)
 Physical Therapy Evaluation Patient Details Name: Heidi Blevins MRN: [id number] DOB: 1951-03-16 Today's Date: 02/11/2024  History of Present Illness  Heidi Blevins is a 73 y.o. female with medical history significant for hypertension, multiple UTIs, CVA, schizoaffective disorder, depression, anxiety, insomnia, CKD 3A, chronic diastolic heart failure, left renal mass and anemia who presented to the ED for evaluation of lower abdominal pain, vaginal itching/pain and hematuria. Admitted to [facility] with bladder obstruction and acute cystitis  Clinical Impression  Pt admitted with above diagnosis.  Pt currently with functional limitations due to the deficits listed below (see PT Problem List). Pt will benefit from acute skilled PT to increase their independence and safety with mobility to allow discharge.       The patient's son present , patient comes from ALF. Patient transfers with assistance, limited   ambulation per patient. Patient should be able to return to  ALF, recommend HHPT. Patient tolerated transfers to [facility] then [facility].  n  If plan is discharge home, recommend the following: A little help with walking and/or transfers;A little help with bathing/dressing/bathroom   Can travel by private vehicle        Equipment Recommendations None recommended by PT  Recommendations for Other Services       Functional Status Assessment Patient has had a recent decline in their functional status and/or demonstrates limited ability to make significant improvements in function in a reasonable and predictable amount of time     Precautions / Restrictions Precautions Precautions: Fall Recall of Precautions/Restrictions: Intact Precaution/Restrictions Comments: bladder irigation Restrictions Weight Bearing Restrictions Per Provider Order: No      Mobility  Bed Mobility   Bed Mobility: Supine to Sit     Supine to sit: Min assist, HOB elevated, Used rails     General bed mobility  comments: able to scoot self to bed edge with cues    Transfers Overall transfer level: Needs assistance   Transfers: Sit to/from Stand, Bed to chair/wheelchair/BSC Sit to Stand: Min assist   Step pivot transfers: Min assist       General transfer comment: limited time tolerate on feet with need to sit prior to body aligned with surface, able to stand and step to [facility] then step around 180* to recliner with multimodal cues.    Ambulation/Gait                  Stairs            Wheelchair Mobility     Tilt Bed    Modified Rankin (Stroke Patients Only)       Balance   Sitting-balance support: Feet supported Sitting balance-Leahy Scale: Fair     Standing balance support: Reliant on assistive device for balance, During functional activity, Bilateral upper extremity supported Standing balance-Leahy Scale: Poor                               Pertinent Vitals/Pain Pain Assessment Pain Assessment: Faces Faces Pain Scale: Hurts little more Pain Location: points to abd Pain Descriptors / Indicators: Discomfort Pain Intervention(s): Monitored during session    Home Living Family/patient expects to be discharged to:: Assisted living                 Home Equipment: Rollator (4 wheels);BSC/3in1 Additional Comments: may benefit from w/c or transport w/c as son reports staff mobilize to patio to smoke via rollator seat    Prior Function  Prior Level of Function : Needs assist             Mobility Comments: reports does not ambulate ADLs Comments: patient reports use of commode and has showers assisted by staff for all self care     Extremity/Trunk Assessment   Upper Extremity Assessment Upper Extremity Assessment: Right hand dominant    Lower Extremity Assessment Lower Extremity Assessment: Generalized weakness    Cervical / Trunk Assessment Cervical / Trunk Assessment: Kyphotic  Communication   Communication Communication:  No apparent difficulties    Cognition Arousal: Alert Behavior During Therapy: WFL for tasks assessed/performed                             Following commands: Intact       Cueing Cueing Techniques: Verbal cues     General Comments General comments (skin integrity, edema, etc.): currently on bladder irrigation, foley cath    Exercises     Assessment/Plan    PT Assessment Patient needs continued PT services  PT Problem List Decreased strength;Decreased cognition;Decreased activity tolerance;Decreased mobility;Decreased knowledge of precautions;Decreased safety awareness;Decreased balance;Pain       PT Treatment Interventions DME instruction;Therapeutic exercise;Gait training;Functional mobility training;Patient/family education;Therapeutic activities;Wheelchair mobility training    PT Goals (Current goals can be found in the Care Plan section)  Acute Rehab PT Goals Patient Stated Goal: agreed to OOB PT Goal Formulation: With patient/family Time For Goal Achievement: 02/25/24 Potential to Achieve Goals: Good    Frequency Min 2X/week     Co-evaluation   Reason for Co-Treatment: Complexity of the patient's impairments (multi-system involvement);For patient/therapist safety;To address functional/ADL transfers PT goals addressed during session: Mobility/safety with mobility;Balance;Proper use of DME OT goals addressed during session: ADL's and self-care;Proper use of Adaptive equipment and DME       AM-PAC PT 6 Clicks Mobility  Outcome Measure Help needed turning from your back to your side while in a flat bed without using bedrails?: A Little Help needed moving from lying on your back to sitting on the side of a flat bed without using bedrails?: A Lot Help needed moving to and from a bed to a chair (including a wheelchair)?: A Lot Help needed standing up from a chair using your arms (e.g., wheelchair or bedside chair)?: A Lot Help needed to walk in  hospital room?: Total Help needed climbing 3-5 steps with a railing? : Total 6 Click Score: 11    End of Session Equipment Utilized During Treatment: Gait belt Activity Tolerance: Patient tolerated treatment well Patient left: in chair;with call bell/phone within reach;with chair alarm set Nurse Communication: Mobility status PT Visit Diagnosis: Unsteadiness on feet (R26.81);Muscle weakness (generalized) (M62.81);Pain    Time: 1041-1105 PT Time Calculation (min) (ACUTE ONLY): 24 min   Charges:   PT Evaluation $PT Eval Low Complexity: 1 Low   PT General Charges $$ ACUTE PT VISIT: 1 Visit         Abelina Hoes PT Acute Rehabilitation Services Office [phone]   Dareen Ebbing 02/11/2024, 1:37 PM

## 2024-02-11 NOTE — Progress Notes (Signed)
 PROGRESS NOTE    Heidi Blevins  ZOX:[id number] DOB: Aug 10, 1951 DOA: 02/09/2024 PCP: Pcp, No   Brief Narrative:  The patient is a 73 year old African-American female with past medical history significant for Mannam to essential hypertension, multiple UTIs, history of CVA, history of schizoaffective disorder, depression and anxiety, insomnia, CKD stage IIIa, chronic diastolic CHF, history of left renal mass with anemia and other comorbidities who presented to the ED for evaluation of lower abdominal pain, vaginal itching and pain as well as hematuria.  She started having abdominal pain last night and had some dysuria, hematuria and vaginal itching and had some associated nausea, fevers and chills.  Further workup was done and UA was too bloody to report adequately but showed greater than 50 WBCs.  CT of the abdomen pelvis was done and showed distended bladder with hemorrhage and mild bilateral hydroureteronephrosis.  Received IM Cogentin  1 mg, fentanyl , Zofran , Rocephin  and urology was consulted and she was placed on IV fluid hydration.   Since last night her blood count has dropped and she has been to be typed and screened and transfused 2 units of PRBCs.  After the 2 units of blood count is improved and is stable at 8.6.  She continues to have a metabolic acidosis and CBC remains elevated but is trending down.  Assessment and Plan:  Bladder obstruction and Bilateral hydroureteronephrosis  - Patient with recently diagnosed left renal mass concerning for RCC presenting with 1 day of hematuria and lower abdominal pain - Found to have abdominal distention, lower abdominal pain and gross hematuria on admission - CT A/P shows distended bladder with hemorrhage as well as mild bilateral hydroureteronephrosis extending to the UVJ - Urology consulted, recommended three-way Foley catheter and bladder irrigation, this was completed by EDP; urology recommends continuing catheter drainage - Continue Foley  catheter drainage and this was blocked so urology had to evacuate and evacuated quite a bit of blood.  Given her drop in hemoglobin we will type and screen and transfuse to units PRBCs - Currently her symptoms are likely in the setting of left renal mass concerning for RCC; she was given IV fluids with IV LR 125 cc/hr and resumed at 75 mL/h for 1 day and now stopped -Pain control with PRN IV Dilaudid  -PT/OT to Evaluate and recommending Home Health    Acute Cystitis versus pyelonephritis - Patient with reported dysuria, lower abdominal pain and vaginal pain - Urine to turbid for adequate report but shows significantly elevated white count; BC has worsened and is now gone from 20.3 is now 48.1 and is now slowly trending down - Continue IV ceftriaxone  - Follow-up urine culture   Left Renal Mass: Patient found to have a 2.6 x 3.0 cm left renal mass on imaging earlier this year. Followed by oncology and urology.  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  Continue to monitor for signs or symptoms of bleeding and repeat CBC in a.m.   AKI  on CKD Stage 3a/ Metabolic Acidosis: BUN/Cr Trend: Recent Labs  Lab 02/09/24 2029 02/10/24 0956 02/11/24 0958  BUN 23 23 26*  CREATININE 1.09* 1.39* 1.27*  -Patient has a metabolic acidosis with a CO2 of 16, anion gap of 9, chloride level of 112 -Avoid Nephrotoxic Medications, Contrast Dyes, Hypotension and Dehydration to  Ensure Adequate Renal Perfusion and will need to Renally Adjust Meds. CTM and Trend Renal Function carefully and repeat CMP in the AM    Essential HTN: BP initially elevated on admission, currently soft but SBP in the 90s to 100s after bladder irrigation. Continue to Hold BP meds for now.  Continue monitor blood pressures per protocol.  Last blood pressure reading was 126/70   Anxiety and depression Schizoaffective disorder - Lithium  level subtherapeutic at 0.14 - Continue lithium , benztropine , lurasidone , fluphenazine  - Continue sertraline  and lorazepam    Hx of CVA HLD - Continue atorvastatin  - Hold Plavix  for now in the setting of active bleed   Hypoalbuminemia: Patient's Albumin Level went from 2.8 -> 2.2 -> 2.1. CTM and Trend and Repeat CMP in the AM   DVT prophylaxis: SCDs Start: 02/10/24 0225    Code Status: Limited: Do not attempt resuscitation (DNR) -DNR-LIMITED -Do Not Intubate/DNI  Family Communication: No family present @ Bedside  Disposition Plan:  Level of care: Telemetry Status is: Inpatient Remains inpatient appropriate because: Needs further clinical improvement and clearance by Urology   Consultants:  Urology  Procedures:  As delineated as above  Antimicrobials:  Anti-infectives (From admission, onward)    Start     Dose/Rate Route Frequency Ordered Stop   02/10/24 2300  cefTRIAXone  (ROCEPHIN ) 1 g in sodium chloride  0.9 % 100 mL IVPB        1 g 200 mL/hr over 30 Minutes Intravenous Every 24 hours 02/10/24 0247     02/09/24 2315  cefTRIAXone  (ROCEPHIN ) 2 g in sodium chloride  0.9 % 100 mL IVPB        2 g 200 mL/hr over 30 Minutes Intravenous  Once 02/09/24 2306 02/09/24 2358       Subjective: Seen and examined at bedside and she is doing okay sitting up in the bedside chair.  States that she was having some itching and also states states that she was hungry.  No nausea or vomiting.  Catheter drainage is now clear.  Denies any other concerns or complaints  at this time.  Objective: Vitals:   02/10/24 2309 02/11/24 0136 02/11/24 0519 02/11/24 0948  BP: (!) 106/56 110/62 118/62 126/70  Pulse: (!) 115 (!) 114 (!) 108 (!) 109  Resp:   15   Temp: 99 F (37.2 C) 98.9 F (37.2 C) 98.8 F (37.1 C) 99.3 F (37.4 C)  TempSrc: Oral Oral Oral Oral  SpO2: 100% 100% 98% 98%  Weight:      Height:        Intake/Output Summary (Last 24 hours) at 02/11/2024 1734 Last data filed at 02/11/2024 1400 Gross per 24 hour  Intake 1430.3 ml  Output 5550 ml  Net -4119.7 ml   Filed Weights   02/09/24 1946 02/10/24 0356  Weight: 49.9 kg 49.8 kg   Examination: Physical Exam:  Constitutional: Thin African-American female who appears calm Respiratory: Diminished to auscultation bilaterally, no wheezing, rales, rhonchi or crackles. Normal respiratory effort and patient is not tachypenic. No accessory muscle use.  Unlabored breathing Cardiovascular: RRR, no murmurs / rubs / gallops. S1 and S2 auscultated. No extremity edema. Abdomen: Soft, non-tender, non-distended. Bowel sounds  positive.  GU: Deferred.  Foley catheter drainage now clear Musculoskeletal: No clubbing / cyanosis of digits/nails. No joint deformity upper and lower extremities.  Skin: No rashes, lesions, ulcers limited skin evaluation. No induration; Warm and dry.  Neurologic: CN 2-12 grossly intact with no focal deficits. Romberg sign and cerebellar reflexes not assessed.  Psychiatric: Normal judgment and insight. Awake and alert  Data Reviewed: I have personally reviewed following labs and imaging studies  CBC: Recent Labs  Lab 02/09/24 2029 02/10/24 0956 02/11/24 0958  WBC 20.3* 48.1* 34.0*  NEUTROABS 16.7* 42.3* 30.5*  HGB 8.5* 6.3* 8.6*  HCT 27.5* 19.4* 25.8*  MCV 95.8 95.6 89.6  PLT 514* 417* 379   Basic Metabolic Panel: Recent Labs  Lab 02/09/24 2029 02/10/24 0956 02/11/24 0958  NA 131* 131* 137  K 4.0 4.7 4.1  CL 104 107 112*  CO2 17* 16* 16*  GLUCOSE 198* 144* 117*   BUN 23 23 26*  CREATININE 1.09* 1.39* 1.27*  CALCIUM  9.5 9.0 9.3  MG  --  1.8 2.0  PHOS  --  3.5 3.4   GFR: Estimated Creatinine Clearance: 28.8 mL/min (A) (by C-G formula based on SCr of 1.27 mg/dL (H)). Liver Function Tests: Recent Labs  Lab 02/09/24 2029 02/10/24 0956 02/11/24 0958  AST 45* 28 41  ALT 48* 33 35  ALKPHOS 230* 140* 149*  BILITOT 0.2 0.2 0.5  PROT 7.7 5.9* 5.8*  ALBUMIN 2.8* 2.2* 2.1*   No results for input(s): LIPASE, AMYLASE in the last 168 hours. No results for input(s): AMMONIA in the last 168 hours. Coagulation Profile: No results for input(s): INR, PROTIME in the last 168 hours. Cardiac Enzymes: No results for input(s): CKTOTAL, CKMB, CKMBINDEX, TROPONINI in the last 168 hours. BNP (last 3 results) No results for input(s): PROBNP in the last 8760 hours. HbA1C: No results for input(s): HGBA1C in the last 72 hours. CBG: No results for input(s): GLUCAP in the last 168 hours. Lipid Profile: No results for input(s): CHOL, HDL, LDLCALC, TRIG, CHOLHDL, LDLDIRECT in the last 72 hours. Thyroid Function Tests: No results for input(s): TSH, T4TOTAL, FREET4, T3FREE, THYROIDAB in the last 72 hours. Anemia Panel: No results for input(s): VITAMINB12, FOLATE, FERRITIN, TIBC, IRON, RETICCTPCT in the last 72 hours. Sepsis Labs: No results for input(s): PROCALCITON, LATICACIDVEN in the last 168 hours.  Recent Results (from the past 240 hours)  Urine Culture     Status: Abnormal (Preliminary result)   Collection Time: 02/09/24 12:21 AM   Specimen: In/Out Cath Urine  Result Value Ref Range Status   Specimen Description   Final    IN/OUT CATH URINE Performed at [facility], [address], [place], [place] [address]    Special Requests   Final    NONE Performed at [facility], [address], [place], [place] [address]    Culture (A)  Final    60,000  COLONIES/mL KLEBSIELLA PNEUMONIAE CULTURE REINCUBATED FOR BETTER GROWTH SUSCEPTIBILITIES TO FOLLOW Performed at [facility], [address] [place], [place] [address]    Report Status PENDING  Incomplete  Urine Culture     Status: Abnormal (Preliminary result)   Collection Time: 02/09/24 12:21 AM   Specimen: Urine, Random  Result Value Ref Range Status   Specimen Description   Final    URINE, RANDOM Performed at [facility], [address] [place], [place] [address]    Special Requests   Final    NONE  Reflexed from [id number] Performed at [facility], [address] [place], [place] [address]    Culture (A)  Final    40,000 COLONIES/mL KLEBSIELLA PNEUMONIAE SUSCEPTIBILITIES TO FOLLOW Performed at [facility], [address], [place], [place] [address]    Report Status PENDING  Incomplete    Radiology Studies: CT ABDOMEN PELVIS W CONTRAST Result Date: 02/09/2024 EXAM: CT ABDOMEN AND PELVIS WITH CONTRAST 02/09/2024 10:47:56 PM TECHNIQUE: CT of the abdomen and pelvis was performed with the administration of intravenous contrast. Multiplanar reformatted images are provided for review. Automated exposure control, iterative reconstruction, and/or weight based adjustment of the mA/kV was utilized to reduce the radiation dose to as low as reasonably achievable. COMPARISON: 09/24/2023 CLINICAL HISTORY: Vaginal bleeding. Pt EMS from [facility] with reports abdominal pain, vaginal itching and pain for the last day. Staff at [facility] noted some vaginal bleeding today. Pt alert and oriented X 4. FINDINGS: LOWER CHEST: No acute abnormality. LIVER: The liver is unremarkable. GALLBLADDER AND BILE DUCTS: Gallbladder is unremarkable. No biliary ductal dilatation. SPLEEN: No acute abnormality. PANCREAS: No acute abnormality. ADRENAL GLANDS: No acute abnormality. KIDNEYS, URETERS AND BLADDER: Mild bilateral hydroureteronephrosis extending to the  UVJ, suggesting sequela of bladder outlet obstruction. Distended bladder with hemorrhage. No stones in the kidneys or ureters. No perinephric or periureteral stranding. GI AND BOWEL: Normal appendix (image 57). Stomach demonstrates no acute abnormality. There is no bowel obstruction. No bowel wall thickening. PERITONEUM AND RETROPERITONEUM: No ascites. No free air. VASCULATURE: Atherosclerotic calcifications of the abdominal aorta and branch vessels, although patent. LYMPH NODES: No lymphadenopathy. REPRODUCTIVE ORGANS: No acute abnormality. BONES AND SOFT TISSUES: Old bilateral sacral and right pelvic fractures. Left hip arthroplasty. S-shaped thoracolumbar scoliosis with multilevel degenerative changes, including mild inferior endplate changes at T10 which are chronic. No acute osseous abnormality. No focal soft tissue abnormality. IMPRESSION: 1. Distended bladder with hemorrhage. 2. Mild bilateral hydroureteronephrosis extending to the UVJ, suggesting sequela of bladder obstruction. Electronically signed by: Zadie Herter MD 02/09/2024 11:24 PM EDT RP Workstation: [id number]   Scheduled Meds:  atorvastatin   40 mg Oral QHS   benztropine   1 mg Oral BID   fluPHENAZine   20 mg Oral BID   lithium  carbonate  150 mg Oral QHS   LORazepam   0.5 mg Oral QID   lurasidone   120 mg Oral Daily   nicotine   7 mg Transdermal Daily   sertraline   150 mg Oral Daily   Continuous Infusions:  cefTRIAXone  (ROCEPHIN )  IV 1 g (02/11/24 0148)   sodium chloride  irrigation 0 mL (02/10/24 0715)    LOS: 1 day   Aura Leeds, DO [facility] Available via Epic secure chat 7am-7pm After these hours, please refer to coverage provider listed on [email or web address] 02/11/2024, 5:34 PM

## 2024-02-11 NOTE — Evaluation (Signed)
 Occupational Therapy Evaluation Patient Details Name: CRAIG Blevins MRN: [id number] DOB: [birth year] Today's Date: 02/11/2024   History of Present Illness   Heidi Blevins is a 73 y.o. female with medical history significant for hypertension, multiple UTIs, CVA, schizoaffective disorder, depression, anxiety, insomnia, CKD 3A, chronic diastolic heart failure, left renal mass and anemia who presented to the ED for evaluation of lower abdominal pain, vaginal itching/pain and hematuria. Admitted to [facility] with bladder obstruction and acute cystitis     Clinical Impressions PTA, patient resides at ALF with support and assistance for all mobility using a rollator to transfer and transport and most ADL's from staff as per son who was present bedside and patient who provided some history. Currently, patient presents with deficits outlined below (see OT Problem List for details) most significantly NPO, compromised bladder issues on bladder irrigation with Foley catheter, generalized muscle weakness, decreased activity tolerance and balance impacting ADL's and mobility. Patient was able to mobilize with assistance to and from commode and recliner with + effort put forth and motivation. Anticipate patient is close to baseline but would benefit from [facility] at ALF as this clinician endorses more gains are possible given therapy. OT will continue to follow in Acute hospital setting to progress function and allow for discharge.      If plan is discharge home, recommend the following:   A lot of help with walking and/or transfers;A lot of help with bathing/dressing/bathroom;Assistance with cooking/housework;Assistance with feeding;Direct supervision/assist for medications management;Direct supervision/assist for financial management;Assist for transportation;Help with stairs or ramp for entrance;Supervision due to cognitive status     Functional Status Assessment   Patient has had a recent decline in  their functional status and demonstrates the ability to make significant improvements in function in a reasonable and predictable amount of time.     Equipment Recommendations    (may benefit from w/c or transport w/c if neeeded for ALF mobility assist)      Precautions/Restrictions   Precautions Precautions: Fall Recall of Precautions/Restrictions: Intact Restrictions Weight Bearing Restrictions Per Provider Order: No     Mobility Bed Mobility Overal bed mobility: Needs Assistance Bed Mobility: Supine to Sit     Supine to sit: Min assist, HOB elevated, Used rails          Transfers Overall transfer level: Needs assistance Equipment used: Rolling walker (2 wheels) Transfers: Sit to/from Stand, Bed to chair/wheelchair/BSC Sit to Stand: Min assist     Step pivot transfers: Min assist     General transfer comment: limited time tolerate on feet with need to sit prior to body aligned with surface      Balance Overall balance assessment: Needs assistance Sitting-balance support: Feet supported Sitting balance-Leahy Scale: Fair     Standing balance support: Reliant on assistive device for balance, During functional activity, Bilateral upper extremity supported Standing balance-Leahy Scale: Poor                             ADL either performed or assessed with clinical judgement   ADL Overall ADL's : Needs assistance/impaired Eating/Feeding: NPO   Grooming: Wash/dry hands;Wash/dry face;Sitting;Set up   Upper Body Bathing: Minimal assistance;Sitting   Lower Body Bathing: Maximal assistance;Sit to/from stand   Upper Body Dressing : Moderate assistance;Sitting   Lower Body Dressing: Maximal assistance;Bed level   Toilet Transfer: Minimal assistance;BSC/3in1;Rolling walker (2 wheels)   Toileting- Clothing Manipulation and Hygiene: Maximal assistance;Sit to/from stand  Functional mobility during ADLs: Minimal assistance;Rolling walker (2  wheels) General ADL Comments: fatigue due to NPO and bladder irrigation in process     Vision Baseline Vision/History: 0 No visual deficits Ability to See in Adequate Light: 0 Adequate Patient Visual Report: No change from baseline Vision Assessment?: No apparent visual deficits Additional Comments: able to read wall clock and RO on white board            Pertinent Vitals/Pain Pain Assessment Pain Assessment: Faces Faces Pain Scale: No hurt     Extremity/Trunk Assessment Upper Extremity Assessment Upper Extremity Assessment: Right hand dominant;Generalized weakness   Lower Extremity Assessment Lower Extremity Assessment: Defer to PT evaluation   Cervical / Trunk Assessment Cervical / Trunk Assessment: Kyphotic   Communication Communication Communication: No apparent difficulties   Cognition Arousal: Alert Behavior During Therapy: WFL for tasks assessed/performed Cognition: No apparent impairments             OT - Cognition Comments: for basic recall and session tasks                 Following commands: Intact       Cueing  General Comments   Cueing Techniques: Verbal cues  currently on bladder irrigation, foley cath           Home Living Family/patient expects to be discharged to:: Assisted living                             Home Equipment: Rollator (4 wheels);BSC/3in1   Additional Comments: may benefit from w/c or transport w/c as son reports staff mobilize to patio to smoke via rollator seat      Prior Functioning/Environment Prior Level of Function : Needs assist             Mobility Comments:  (son reports transfers and transport via rollator by ALF staff) ADLs Comments: patient reports use of commode and has showers assisted by staff for all self care    OT Problem List: Decreased strength;Decreased activity tolerance;Impaired balance (sitting and/or standing)   OT Treatment/Interventions: Self-care/ADL  training;Therapeutic exercise;Neuromuscular education;Energy conservation;DME and/or AE instruction;Therapeutic activities;Patient/family education;Balance training      OT Goals(Current goals can be found in the care plan section)   Acute Rehab OT Goals Patient Stated Goal: to eat soon and get stronger OT Goal Formulation: With patient/family Time For Goal Achievement: 02/25/24 Potential to Achieve Goals: Good ADL Goals Pt Will Perform Grooming: with set-up;sitting Pt Will Perform Upper Body Bathing: with set-up;sitting Pt Will Perform Lower Body Bathing: with min assist;sit to/from stand Pt Will Perform Upper Body Dressing: with contact guard assist;sitting Pt Will Perform Lower Body Dressing: with mod assist;sit to/from stand Pt Will Transfer to Toilet: with contact guard assist;bedside commode Pt Will Perform Toileting - Clothing Manipulation and hygiene: with min assist;sit to/from stand Pt/caregiver will Perform Home Exercise Program: Increased strength;With Supervision;With written HEP provided;Both right and left upper extremity   OT Frequency:  Min 2X/week    Co-evaluation PT/OT/SLP Co-Evaluation/Treatment: Yes Reason for Co-Treatment: Complexity of the patient's impairments (multi-system involvement) PT goals addressed during session: Mobility/safety with mobility;Balance;Proper use of DME OT goals addressed during session: ADL's and self-care;Proper use of Adaptive equipment and DME      AM-PAC OT 6 Clicks Daily Activity     Outcome Measure Help from another person eating meals?: Total (NPO) Help from another person taking care of personal grooming?: A Little Help from  another person toileting, which includes using toliet, bedpan, or urinal?: A Lot Help from another person bathing (including washing, rinsing, drying)?: A Lot Help from another person to put on and taking off regular upper body clothing?: A Little Help from another person to put on and taking off  regular lower body clothing?: A Lot 6 Click Score: 13   End of Session Equipment Utilized During Treatment: Gait belt;Rolling walker (2 wheels) Nurse Communication: Mobility status  Activity Tolerance: Patient limited by fatigue Patient left: in chair;with call bell/phone within reach;with chair alarm set  OT Visit Diagnosis: Unsteadiness on feet (R26.81);Muscle weakness (generalized) (M62.81);History of falling (Z91.81)                Time: 4259-5638 OT Time Calculation (min): 20 min Charges:  OT General Charges $OT Visit: 1 Visit OT Evaluation $OT Eval Low Complexity: 1 Low  Shoichi Mielke OT/L Acute Rehabilitation Department  [phone]  02/11/2024, 12:22 PM

## 2024-02-11 NOTE — Progress Notes (Incomplete)
 PROGRESS NOTE    Heidi Blevins  ZOX:[id number] DOB: 02-27-1951 DOA: 02/09/2024 PCP: Pcp, No   Brief Narrative:  No notes on file     DVT prophylaxis: SCDs Start: 02/10/24 0225    Code Status: Limited: Do not attempt resuscitation (DNR) -DNR-LIMITED -Do Not Intubate/DNI  Family Communication:   Disposition Plan:  Level of care: Telemetry Status is: Inpatient {Inpatient:23812}    Consultants:  ***  Procedures:  ***  Antimicrobials:  Anti-infectives (From admission, onward)    Start     Dose/Rate Route Frequency Ordered Stop   02/10/24 2300  cefTRIAXone  (ROCEPHIN ) 1 g in sodium chloride  0.9 % 100 mL IVPB        1 g 200 mL/hr over 30 Minutes Intravenous Every 24 hours 02/10/24 0247     02/09/24 2315  cefTRIAXone  (ROCEPHIN ) 2 g in sodium chloride  0.9 % 100 mL IVPB        2 g 200 mL/hr over 30 Minutes Intravenous  Once 02/09/24 2306 02/09/24 2358        Subjective: ***  Objective: Vitals:   02/10/24 2309 02/11/24 0136 02/11/24 0519 02/11/24 0948  BP: (!) 106/56 110/62 118/62 126/70  Pulse: (!) 115 (!) 114 (!) 108 (!) 109  Resp:   15   Temp: 99 F (37.2 C) 98.9 F (37.2 C) 98.8 F (37.1 C) 99.3 F (37.4 C)  TempSrc: Oral Oral Oral Oral  SpO2: 100% 100% 98% 98%  Weight:      Height:        Intake/Output Summary (Last 24 hours) at 02/11/2024 1544 Last data filed at 02/11/2024 1400 Gross per 24 hour  Intake 1430.3 ml  Output 7150 ml  Net -5719.7 ml   Filed Weights   02/09/24 1946 02/10/24 0356  Weight: 49.9 kg 49.8 kg    Examination: Physical Exam:  Constitutional: WN/WD, NAD and appears calm and comfortable Eyes: PERRL, lids and conjunctivae normal, sclerae anicteric  ENMT: External Ears, Nose appear normal. Grossly normal hearing. Mucous membranes are moist. Posterior pharynx clear of any exudate or lesions. Normal dentition.  Neck: Appears normal, supple, no cervical masses, normal ROM, no appreciable thyromegaly Respiratory: Clear to  auscultation bilaterally, no wheezing, rales, rhonchi or crackles. Normal respiratory effort and patient is not tachypenic. No accessory muscle use.  Cardiovascular: RRR, no murmurs / rubs / gallops. S1 and S2 auscultated. No extremity edema. 2+ pedal pulses. No carotid bruits.  Abdomen: Soft, non-tender, non-distended. No masses palpated. No appreciable hepatosplenomegaly. Bowel sounds positive.  GU: Deferred. Musculoskeletal: No clubbing / cyanosis of digits/nails. No joint deformity upper and lower extremities. Good ROM, no contractures. Normal strength and muscle tone.  Skin: No rashes, lesions, ulcers. No induration; Warm and dry.  Neurologic: CN 2-12 grossly intact with no focal deficits. Sensation intact in all 4 Extremities, DTR normal. Strength 5/5 in all 4. Romberg sign cerebellar reflexes not assessed.  Psychiatric: Normal judgment and insight. Alert and oriented x 3. Normal mood and appropriate affect.   Data Reviewed: I have personally reviewed following labs and imaging studies  CBC: Recent Labs  Lab 02/09/24 2029 02/10/24 0956 02/11/24 0958  WBC 20.3* 48.1* 34.0*  NEUTROABS 16.7* 42.3* 30.5*  HGB 8.5* 6.3* 8.6*  HCT 27.5* 19.4* 25.8*  MCV 95.8 95.6 89.6  PLT 514* 417* 379   Basic Metabolic Panel: Recent Labs  Lab 02/09/24 2029 02/10/24 0956 02/11/24 0958  NA 131* 131* 137  K 4.0 4.7 4.1  CL 104 107 112*  CO2 17*  16* 16*  GLUCOSE 198* 144* 117*  BUN 23 23 26*  CREATININE 1.09* 1.39* 1.27*  CALCIUM  9.5 9.0 9.3  MG  --  1.8 2.0  PHOS  --  3.5 3.4   GFR: Estimated Creatinine Clearance: 28.8 mL/min (A) (by C-G formula based on SCr of 1.27 mg/dL (H)). Liver Function Tests: Recent Labs  Lab 02/09/24 2029 02/10/24 0956 02/11/24 0958  AST 45* 28 41  ALT 48* 33 35  ALKPHOS 230* 140* 149*  BILITOT 0.2 0.2 0.5  PROT 7.7 5.9* 5.8*  ALBUMIN 2.8* 2.2* 2.1*   No results for input(s): LIPASE, AMYLASE in the last 168 hours. No results for input(s): AMMONIA  in the last 168 hours. Coagulation Profile: No results for input(s): INR, PROTIME in the last 168 hours. Cardiac Enzymes: No results for input(s): CKTOTAL, CKMB, CKMBINDEX, TROPONINI in the last 168 hours. BNP (last 3 results) No results for input(s): PROBNP in the last 8760 hours. HbA1C: No results for input(s): HGBA1C in the last 72 hours. CBG: No results for input(s): GLUCAP in the last 168 hours. Lipid Profile: No results for input(s): CHOL, HDL, LDLCALC, TRIG, CHOLHDL, LDLDIRECT in the last 72 hours. Thyroid Function Tests: No results for input(s): TSH, T4TOTAL, FREET4, T3FREE, THYROIDAB in the last 72 hours. Anemia Panel: No results for input(s): VITAMINB12, FOLATE, FERRITIN, TIBC, IRON, RETICCTPCT in the last 72 hours. Sepsis Labs: No results for input(s): PROCALCITON, LATICACIDVEN in the last 168 hours.  Recent Results (from the past 240 hours)  Urine Culture     Status: Abnormal (Preliminary result)   Collection Time: 02/09/24 12:21 AM   Specimen: In/Out Cath Urine  Result Value Ref Range Status   Specimen Description   Final    IN/OUT CATH URINE Performed at [facility], [address], [place], [place] [address]    Special Requests   Final    NONE Performed at [facility], [address] [place] [address], [place], [place] [address]    Culture (A)  Final    60,000 COLONIES/mL KLEBSIELLA PNEUMONIAE CULTURE REINCUBATED FOR BETTER GROWTH SUSCEPTIBILITIES TO FOLLOW Performed at [facility], [address] [place], [place] [address]    Report Status PENDING  Incomplete  Urine Culture     Status: Abnormal (Preliminary result)   Collection Time: 02/09/24 12:21 AM   Specimen: Urine, Random  Result Value Ref Range Status   Specimen Description   Final    URINE, RANDOM Performed at [facility], [address], [place], [place] [address]    Special Requests   Final     NONE Reflexed from [id number] Performed at [facility], [address] [place], [place] [address]    Culture (A)  Final    40,000 COLONIES/mL KLEBSIELLA PNEUMONIAE SUSCEPTIBILITIES TO FOLLOW Performed at [facility], [address], [place], [place] [address]    Report Status PENDING  Incomplete     Radiology Studies: CT ABDOMEN PELVIS W CONTRAST Result Date: 02/09/2024 EXAM: CT ABDOMEN AND PELVIS WITH CONTRAST 02/09/2024 10:47:56 PM TECHNIQUE: CT of the abdomen and pelvis was performed with the administration of intravenous contrast. Multiplanar reformatted images are provided for review. Automated exposure control, iterative reconstruction, and/or weight based adjustment of the mA/kV was utilized to reduce the radiation dose to as low as reasonably achievable. COMPARISON: 09/24/2023 CLINICAL HISTORY: Vaginal bleeding. Pt EMS from [facility] with reports abdominal pain, vaginal itching and pain for the last day. Staff at [facility] noted some vaginal bleeding today. Pt alert and oriented X 4. FINDINGS: LOWER CHEST: No acute abnormality. LIVER: The liver is unremarkable. GALLBLADDER AND BILE DUCTS: Gallbladder is unremarkable. No biliary ductal dilatation. SPLEEN: No acute abnormality. PANCREAS: No acute abnormality. ADRENAL GLANDS: No acute abnormality. KIDNEYS, URETERS AND BLADDER: Mild bilateral hydroureteronephrosis extending to the UVJ, suggesting sequela of bladder outlet obstruction. Distended bladder with hemorrhage. No stones in the kidneys or ureters. No perinephric or periureteral stranding. GI AND BOWEL: Normal appendix (image 57). Stomach demonstrates no acute abnormality. There is no bowel obstruction. No bowel wall thickening. PERITONEUM AND RETROPERITONEUM: No ascites. No free air. VASCULATURE: Atherosclerotic calcifications of the abdominal aorta and branch vessels, although patent. LYMPH NODES: No lymphadenopathy. REPRODUCTIVE ORGANS: No acute  abnormality. BONES AND SOFT TISSUES: Old bilateral sacral and right pelvic fractures. Left hip arthroplasty. S-shaped thoracolumbar scoliosis with multilevel degenerative changes, including mild inferior endplate changes at T10 which are chronic. No acute osseous abnormality. No focal soft tissue abnormality. IMPRESSION: 1. Distended bladder with hemorrhage. 2. Mild bilateral hydroureteronephrosis extending to the UVJ, suggesting sequela of bladder obstruction. Electronically signed by: Sriyesh Krishnan MD 02/09/2024 11:24 PM EDT RP Workstation: [id number]     Scheduled Meds:  atorvastatin   40 mg Oral QHS   benztropine   1 mg Oral BID   fluPHENAZine   20 mg Oral BID   lithium  carbonate  150 mg Oral QHS   LORazepam   0.5 mg Oral QID   lurasidone   120 mg Oral Daily   nicotine   7 mg Transdermal Daily   sertraline   150 mg Oral Daily   Continuous Infusions:  cefTRIAXone  (ROCEPHIN )  IV 1 g (02/11/24 0148)   sodium chloride  irrigation 0 mL (02/10/24 0715)     LOS: 1 day    Time spent: *** minutes spent on chart review, discussion with nursing staff, consultants, updating family and interview/physical exam; more than 50% of that time was spent in counseling and/or coordination of care.   Eveline Hipps, DO [facility] Available via Epic secure chat 7am-7pm After these hours, please refer to coverage provider listed on [email or web address] 02/11/2024, 3:44 PM

## 2024-02-11 NOTE — Progress Notes (Signed)
 Subjective: Complaining of abdominal pain, denies nausea vomiting tolerating diet.  Catheter in place although CBI urine clear yellow.  Objective: Vital signs in last 24 hours: Temp:  [97.9 F (36.6 C)-99.4 F (37.4 C)] 99.3 F (37.4 C) (06/16 0948) Pulse Rate:  [105-115] 109 (06/16 0948) Resp:  [15-18] 15 (06/16 0519) BP: (102-126)/(48-70) 126/70 (06/16 0948) SpO2:  [98 %-100 %] 98 % (06/16 0948)  Intake/Output from previous day: 06/15 0701 - 06/16 0700 In: 2410.3 [I.V.:556.3; Blood:654; IV Piggyback:100] Out: 8150 [Urine:8150] Intake/Output this shift: No intake/output data recorded.  Physical Exam:  General: Alert and oriented CV: RRR Lungs: Clear Abdomen: Soft, minimally tender, no rebound or guarding. GU: Three-way catheter in place on low rate CBI.  Urine clear yellow.  Lab Results: Recent Labs    02/09/24 2029 02/10/24 0956 02/11/24 0958  HGB 8.5* 6.3* 8.6*  HCT 27.5* 19.4* 25.8*   BMET Recent Labs    02/10/24 0956 02/11/24 0958  NA 131* 137  K 4.7 4.1  CL 107 112*  CO2 16* 16*  GLUCOSE 144* 117*  BUN 23 26*  CREATININE 1.39* 1.27*  CALCIUM  9.0 9.3     Studies/Results: CT ABDOMEN PELVIS W CONTRAST Result Date: 02/09/2024 EXAM: CT ABDOMEN AND PELVIS WITH CONTRAST 02/09/2024 10:47:56 PM TECHNIQUE: CT of the abdomen and pelvis was performed with the administration of intravenous contrast. Multiplanar reformatted images are provided for review. Automated exposure control, iterative reconstruction, and/or weight based adjustment of the mA/kV was utilized to reduce the radiation dose to as low as reasonably achievable. COMPARISON: 09/24/2023 CLINICAL HISTORY: Vaginal bleeding. Pt EMS from [facility] with reports abdominal pain, vaginal itching and pain for the last day. Staff at [facility] noted some vaginal bleeding today. Pt alert and oriented X 4. FINDINGS: LOWER CHEST: No acute abnormality. LIVER: The liver is unremarkable. GALLBLADDER AND BILE  DUCTS: Gallbladder is unremarkable. No biliary ductal dilatation. SPLEEN: No acute abnormality. PANCREAS: No acute abnormality. ADRENAL GLANDS: No acute abnormality. KIDNEYS, URETERS AND BLADDER: Mild bilateral hydroureteronephrosis extending to the UVJ, suggesting sequela of bladder outlet obstruction. Distended bladder with hemorrhage. No stones in the kidneys or ureters. No perinephric or periureteral stranding. GI AND BOWEL: Normal appendix (image 57). Stomach demonstrates no acute abnormality. There is no bowel obstruction. No bowel wall thickening. PERITONEUM AND RETROPERITONEUM: No ascites. No free air. VASCULATURE: Atherosclerotic calcifications of the abdominal aorta and branch vessels, although patent. LYMPH NODES: No lymphadenopathy. REPRODUCTIVE ORGANS: No acute abnormality. BONES AND SOFT TISSUES: Old bilateral sacral and right pelvic fractures. Left hip arthroplasty. S-shaped thoracolumbar scoliosis with multilevel degenerative changes, including mild inferior endplate changes at T10 which are chronic. No acute osseous abnormality. No focal soft tissue abnormality. IMPRESSION: 1. Distended bladder with hemorrhage. 2. Mild bilateral hydroureteronephrosis extending to the UVJ, suggesting sequela of bladder obstruction. Electronically signed by: Zadie Herter MD 02/09/2024 11:24 PM EDT RP Workstation: [id number]    Assessment/Plan: 73 year old female with schizoaffective disorder presented gross hematuria clot retention.  Gross hematuria: -CBI on low rate with clear yellow urine no concerns for active bleed. - Continue to hold Plavix  - Hematocrit 25.8 this morning - Continue to monitor H&H  Tachycardia: -Concern for worsening heart failure history of arrhythmia - No evidence of bladder blood loss.  Urinary retention: - Bilateral hydronephrosis on CT likely resolved with clot irrigation - Continue catheter.  Urinary tract infection: -Urine culture positive for Klebsiella - Continue  ceftriaxone     LOS: 1 day   Aimee Houseman MD 02/11/2024, 7:10 PM  [facility]

## 2024-02-12 DIAGNOSIS — D62 Acute posthemorrhagic anemia: Secondary | ICD-10-CM | POA: Diagnosis not present

## 2024-02-12 DIAGNOSIS — N1330 Unspecified hydronephrosis: Secondary | ICD-10-CM | POA: Diagnosis not present

## 2024-02-12 DIAGNOSIS — D649 Anemia, unspecified: Secondary | ICD-10-CM | POA: Diagnosis not present

## 2024-02-12 DIAGNOSIS — N320 Bladder-neck obstruction: Secondary | ICD-10-CM | POA: Diagnosis not present

## 2024-02-12 DIAGNOSIS — R310 Gross hematuria: Secondary | ICD-10-CM

## 2024-02-12 LAB — CBC WITH DIFFERENTIAL/PLATELET
Abs Immature Granulocytes: 0.79 10*3/uL — ABNORMAL HIGH (ref 0.00–0.07)
Basophils Absolute: 0.1 10*3/uL (ref 0.0–0.1)
Basophils Relative: 0 %
Eosinophils Absolute: 0.1 10*3/uL (ref 0.0–0.5)
Eosinophils Relative: 0 %
HCT: 26.3 % — ABNORMAL LOW (ref 36.0–46.0)
Hemoglobin: 8.4 g/dL — ABNORMAL LOW (ref 12.0–15.0)
Immature Granulocytes: 2 %
Lymphocytes Relative: 4 %
Lymphs Abs: 1.5 10*3/uL (ref 0.7–4.0)
MCH: 29.3 pg (ref 26.0–34.0)
MCHC: 31.9 g/dL (ref 30.0–36.0)
MCV: 91.6 fL (ref 80.0–100.0)
Monocytes Absolute: 1.5 10*3/uL — ABNORMAL HIGH (ref 0.1–1.0)
Monocytes Relative: 4 %
Neutro Abs: 33.1 10*3/uL — ABNORMAL HIGH (ref 1.7–7.7)
Neutrophils Relative %: 90 %
Platelets: 398 10*3/uL (ref 150–400)
RBC: 2.87 MIL/uL — ABNORMAL LOW (ref 3.87–5.11)
RDW: 16.3 % — ABNORMAL HIGH (ref 11.5–15.5)
WBC: 37 10*3/uL — ABNORMAL HIGH (ref 4.0–10.5)
nRBC: 0 % (ref 0.0–0.2)

## 2024-02-12 LAB — URINE CULTURE
Culture: 40000 — AB
Culture: 60000 — AB

## 2024-02-12 LAB — PHOSPHORUS: Phosphorus: 2.6 mg/dL (ref 2.5–4.6)

## 2024-02-12 LAB — COMPREHENSIVE METABOLIC PANEL WITH GFR
ALT: 84 U/L — ABNORMAL HIGH (ref 0–44)
AST: 148 U/L — ABNORMAL HIGH (ref 15–41)
Albumin: 2.2 g/dL — ABNORMAL LOW (ref 3.5–5.0)
Alkaline Phosphatase: 178 U/L — ABNORMAL HIGH (ref 38–126)
Anion gap: 8 (ref 5–15)
BUN: 21 mg/dL (ref 8–23)
CO2: 18 mmol/L — ABNORMAL LOW (ref 22–32)
Calcium: 9.8 mg/dL (ref 8.9–10.3)
Chloride: 110 mmol/L (ref 98–111)
Creatinine, Ser: 1.08 mg/dL — ABNORMAL HIGH (ref 0.44–1.00)
GFR, Estimated: 55 mL/min — ABNORMAL LOW (ref >60)
Glucose, Bld: 119 mg/dL — ABNORMAL HIGH (ref 70–99)
Potassium: 3.5 mmol/L (ref 3.5–5.1)
Sodium: 136 mmol/L (ref 135–145)
Total Bilirubin: 0.6 mg/dL (ref 0.0–1.2)
Total Protein: 6.6 g/dL (ref 6.5–8.1)

## 2024-02-12 LAB — MAGNESIUM: Magnesium: 2.1 mg/dL (ref 1.7–2.4)

## 2024-02-12 MED ORDER — SODIUM BICARBONATE 650 MG PO TABS
650.0000 mg | ORAL_TABLET | Freq: Two times a day (BID) | ORAL | Status: DC
  Administered 2024-02-12 – 2024-02-20 (×16): 650 mg via ORAL
  Filled 2024-02-12 (×16): qty 1

## 2024-02-12 MED ORDER — SODIUM CHLORIDE 0.9 % IV SOLN
1.0000 g | Freq: Two times a day (BID) | INTRAVENOUS | Status: DC
  Administered 2024-02-12 – 2024-02-20 (×17): 1 g via INTRAVENOUS
  Filled 2024-02-12 (×17): qty 20

## 2024-02-12 NOTE — TOC Progression Note (Signed)
 Transition of Care [facility]) - Progression Note    Patient Details  Name: Heidi Blevins MRN: [id number] Date of Birth: Dec 30, 1950  Transition of Care [facility] CM/SW Contact  Gertha Ku, LCSW Phone Number: 02/12/2024, 11:50 AM  Clinical Narrative:     Pt is from [facility]. CSW spoke with [facility] from [employer] to discuss recommendations for home health services. She is requesting home health orders and FL2 to be faxed to [phone].TOC to follow.    Barriers to Discharge: Continued Medical Work up  Expected Discharge Plan and Services                                               Social Determinants of Health (SDOH) Interventions SDOH Screenings   Food Insecurity: No Food Insecurity (02/10/2024)  Housing: Low Risk  (02/10/2024)  Transportation Needs: No Transportation Needs (02/10/2024)  Utilities: Not At Risk (02/10/2024)  Social Connections: Moderately Integrated (02/10/2024)  Tobacco Use: High Risk (02/09/2024)    Readmission Risk Interventions    09/24/2023   12:58 PM  Readmission Risk Prevention Plan  Post Dischage Appt Complete  Medication Screening Complete  Transportation Screening Complete

## 2024-02-12 NOTE — Progress Notes (Signed)
 Pt complaining of abd pain, noticed clots , irrigated x3. Tolerated well.

## 2024-02-12 NOTE — Plan of Care (Signed)

## 2024-02-12 NOTE — Progress Notes (Signed)
 PROGRESS NOTE    Heidi Blevins  ZOX:[id number] DOB: Jan 08, 1951 DOA: 02/09/2024 PCP: Pcp, No   Brief Narrative:  The patient is a 73 year old African-American female with past medical history significant for Mannam to essential hypertension, multiple UTIs, history of CVA, history of schizoaffective disorder, depression and anxiety, insomnia, CKD stage IIIa, chronic diastolic CHF, history of left renal mass with anemia and other comorbidities who presented to the ED for evaluation of lower abdominal pain, vaginal itching and pain as well as hematuria.  She started having abdominal pain last night and had some dysuria, hematuria and vaginal itching and had some associated nausea, fevers and chills.  Further workup was done and UA was too bloody to report adequately but showed greater than 50 WBCs.  CT of the abdomen pelvis was done and showed distended bladder with hemorrhage and mild bilateral hydroureteronephrosis.  Received IM Cogentin  1 mg, fentanyl , Zofran , Rocephin  and urology was consulted and she was placed on IV fluid hydration.   Since last night her blood count has dropped and she has been to be typed and screened and transfused 2 units of PRBCs.  After the 2 units of blood count is improved and is stable at 8.4.  She continues to have a metabolic acidosis and WBC remains elevated. Changed Abx to IV Meropenem  Assessment and Plan:  Bladder obstruction and Bilateral Hydroureteronephrosis  - Patient with recently diagnosed left renal mass concerning for RCC presenting with 1 day of hematuria and lower abdominal pain - Found to have abdominal distention, lower abdominal pain and gross hematuria on admission - CT A/P shows distended bladder with hemorrhage as well as mild bilateral hydroureteronephrosis extending to the UVJ - Urology consulted, recommended three-way Foley catheter and bladder irrigation, this was completed by EDP; Urology recommends continuing catheter drainage at this time  and CBI clamped this AM but restarted again slowly this evening  - Continue Foley catheter drainage and this was blocked so urology had to evacuate and evacuated quite a bit of blood.  Given her drop in hemoglobin we will type and screen and transfuse to units PRBCs - She has  Left renal mass concerning for RCC; she was given IV fluids with IV LR 125 cc/hr and resumed at 75 mL/h for 1 day and now stopped -Pain control with PRN IV Dilaudid  -PT/OT to Evaluate and recommending Home Health when stable to D/C   Acute Cystitis versus Pyelonephritis - Patient with reported dysuria, lower abdominal pain and vaginal pain - Urine to turbid for adequate report but shows significantly elevated white count; WBC remains significantly elevate and is now gone from 20.3 -> 48.1 -> 34.0 -> 37.0 (Check at Onc office in 4/11 and . Check Blood Cx x2 (Unfortunately not done on Admit). CTA as above; Check CXR in the AM as well  - Follow-up urine culture showing Klebsiella 60,000 CFU of ESBL Klebsiella so will change IV Ceftriaxone  to IV Meropenem    Left Renal Mass: Patient found to have a 2.6 x 3.0 cm left renal mass on imaging earlier this year. Followed by oncology and urology.  Urology following, appreciate recs and she has follow-up imaging planned next month; Per discussion w/ Urology does not think that this is the source of her Gross Hematuria    Gross Hematuria/ Acute Blood Loss Anemia superimposed on Chronic Anemia - Followed by oncology in outpatient setting and underwent Bone Marrow Bx where pathology did not indicate underlying bone marrow disorder; Has undergone extensive workup for  Anemia and it was felt her Anemia was an Inflammatory Process - Hgb of 8.5 on admission only slight drop from baseline of 9-10; now hemoglobin 6.3 and there is quite a bit of blood in her bladder that was irrigated - Recent iron studies by oncologist shows possible anemia of chronic disease/inflammation - Trend CBC and repeat CBC  after blood transfusion shows stable hemoglobin and hematocrit of 8.4/26.3 but started slowly bleeding again so CBI reopened  - Transfuse for Hgb >7 and she is being typed and screened and transfused 2 units PRBCs.  Continue to monitor for signs or symptoms of bleeding and repeat CBC in a.m.   AKI on CKD Stage 3a/ Metabolic Acidosis: BUN/Cr Trend went from 23/1.09 -> 23/1.39 -> 26/1.27 -> 21/1.08. IVF now stopped. Patient has a metabolic acidosis with a CO2 of 18, anion gap of 8, chloride level of 110. Start Sodium Bicarbonate 650 mg po BID. Avoid Nephrotoxic Medications, Contrast Dyes, Hypotension and Dehydration to Ensure Adequate Renal Perfusion and will need to Renally Adjust Meds. CTM and Trend Renal Function carefully and repeat CMP in the AM    Essential HTN: BP initially elevated on admission, currently soft but SBP in the 90s to 100s after bladder irrigation. Continue to Hold BP meds for now.  Continue monitor blood pressures per protocol.  Last blood pressure reading was 122/83   Anxiety and depression Schizoaffective disorder - Lithium  level subtherapeutic at 0.14 - Continue lithium , benztropine , lurasidone , fluphenazine  - Continue sertraline  and lorazepam   Abnormal LFTs/ Elevated LFTs: Elevated on Admission and had normalize but now Worsened again. AST is now 148 and ALT is now 84. CTM and Trend and repeat CMP in the AM. Will obtain RUQ U/S in the AM and Acute Hepatitis Panel in the AM   Hx of CVA  HLD -Contining Atorvastatin  but may need to hold given Abnormal LFTs -Continue to Hold Plavix  for now in the setting of active bleed   Hypoalbuminemia: Patient's Albumin Level is now 2.2. CTM and Trend and Repeat CMP in the AM   DVT prophylaxis: SCDs Start: 02/10/24 0225    Code Status: Limited: Do not attempt resuscitation (DNR) -DNR-LIMITED -Do Not Intubate/DNI  Family Communication: No family @ Bedside  Disposition Plan:  Level of care: Telemetry Status is: Inpatient Remains  inpatient appropriate because: Needs further clinical improvement, workup, and clearance by Urology    Consultants:  Urology  Procedures:  As delineated as above  Antimicrobials:  Anti-infectives (From admission, onward)    Start     Dose/Rate Route Frequency Ordered Stop   02/12/24 1400  meropenem (MERREM) 1 g in sodium chloride  0.9 % 100 mL IVPB        1 g 200 mL/hr over 30 Minutes Intravenous Every 12 hours 02/12/24 1254     02/10/24 2300  cefTRIAXone  (ROCEPHIN ) 1 g in sodium chloride  0.9 % 100 mL IVPB  Status:  Discontinued        1 g 200 mL/hr over 30 Minutes Intravenous Every 24 hours 02/10/24 0247 02/12/24 1254   02/09/24 2315  cefTRIAXone  (ROCEPHIN ) 2 g in sodium chloride  0.9 % 100 mL IVPB        2 g 200 mL/hr over 30 Minutes Intravenous  Once 02/09/24 2306 02/09/24 2358       Subjective: Seen and examined at bedside she is sitting in a chair and asleep and awoken from sleep.  Had no complaints.  Not complaining of any abdominal discomfort or pain today.  Urine  was clear in the AM but becoming Bloody again this evening.  No other concerns or complaints this time.  Objective: Vitals:   02/11/24 0948 02/11/24 1952 02/12/24 0245 02/12/24 1300  BP: 126/70 118/70 131/78 122/83  Pulse: (!) 109 (!) 120 (!) 109 (!) 103  Resp:  18 18 19   Temp: 99.3 F (37.4 C) 99.5 F (37.5 C) 98.4 F (36.9 C) 99.8 F (37.7 C)  TempSrc: Oral Oral Axillary   SpO2: 98% 96% 97% 97%  Weight:      Height:        Intake/Output Summary (Last 24 hours) at 02/12/2024 1955 Last data filed at 02/12/2024 1300 Gross per 24 hour  Intake 240 ml  Output 9200 ml  Net -8960 ml   Filed Weights   02/09/24 1946 02/10/24 0356  Weight: 49.9 kg 49.8 kg   Examination: Physical Exam:  Constitutional: Chronically ill appearing AAF in NAD.  Respiratory: Diminished to auscultation bilaterally, no wheezing, rales, rhonchi or crackles. Normal respiratory effort and patient is not tachypenic. No accessory  muscle use. Unlabored breathing  Cardiovascular: RRR, no murmurs / rubs / gallops. S1 and S2 auscultated. No extremity edema Abdomen: Soft, non-tender, Distended 2/2 body habitus. Bowel sounds positive.  GU: Catheter is draining Yellow Clear Urine Musculoskeletal: No clubbing / cyanosis of digits/nails. No joint deformity upper and lower extremities.  Neurologic: CN 2-12 grossly intact with no focal deficits when awoken from sleep. Romberg sign cerebellar reflexes not assessed.  Psychiatric: Calm. Was a little somnolent and drowsy   Data Reviewed: I have personally reviewed following labs and imaging studies  CBC: Recent Labs  Lab 02/09/24 2029 02/10/24 0956 02/11/24 0958 02/12/24 1033  WBC 20.3* 48.1* 34.0* 37.0*  NEUTROABS 16.7* 42.3* 30.5* 33.1*  HGB 8.5* 6.3* 8.6* 8.4*  HCT 27.5* 19.4* 25.8* 26.3*  MCV 95.8 95.6 89.6 91.6  PLT 514* 417* 379 398   Basic Metabolic Panel: Recent Labs  Lab 02/09/24 2029 02/10/24 0956 02/11/24 0958 02/12/24 1033  NA 131* 131* 137 136  K 4.0 4.7 4.1 3.5  CL 104 107 112* 110  CO2 17* 16* 16* 18*  GLUCOSE 198* 144* 117* 119*  BUN 23 23 26* 21  CREATININE 1.09* 1.39* 1.27* 1.08*  CALCIUM  9.5 9.0 9.3 9.8  MG  --  1.8 2.0 2.1  PHOS  --  3.5 3.4 2.6   GFR: Estimated Creatinine Clearance: 33.8 mL/min (A) (by C-G formula based on SCr of 1.08 mg/dL (H)). Liver Function Tests: Recent Labs  Lab 02/09/24 2029 02/10/24 0956 02/11/24 0958 02/12/24 1033  AST 45* 28 41 148*  ALT 48* 33 35 84*  ALKPHOS 230* 140* 149* 178*  BILITOT 0.2 0.2 0.5 0.6  PROT 7.7 5.9* 5.8* 6.6  ALBUMIN 2.8* 2.2* 2.1* 2.2*   No results for input(s): LIPASE, AMYLASE in the last 168 hours. No results for input(s): AMMONIA in the last 168 hours. Coagulation Profile: No results for input(s): INR, PROTIME in the last 168 hours. Cardiac Enzymes: No results for input(s): CKTOTAL, CKMB, CKMBINDEX, TROPONINI in the last 168 hours. BNP (last 3  results) No results for input(s): PROBNP in the last 8760 hours. HbA1C: No results for input(s): HGBA1C in the last 72 hours. CBG: No results for input(s): GLUCAP in the last 168 hours. Lipid Profile: No results for input(s): CHOL, HDL, LDLCALC, TRIG, CHOLHDL, LDLDIRECT in the last 72 hours. Thyroid Function Tests: No results for input(s): TSH, T4TOTAL, FREET4, T3FREE, THYROIDAB in the last 72 hours. Anemia Panel:  No results for input(s): VITAMINB12, FOLATE, FERRITIN, TIBC, IRON, RETICCTPCT in the last 72 hours. Sepsis Labs: No results for input(s): PROCALCITON, LATICACIDVEN in the last 168 hours.  Recent Results (from the past 240 hours)  Urine Culture     Status: Abnormal   Collection Time: 02/09/24 12:21 AM   Specimen: In/Out Cath Urine  Result Value Ref Range Status   Specimen Description   Final    IN/OUT CATH URINE Performed at [facility], [address], [place], [place] [address]    Special Requests   Final    NONE Performed at [facility], [address], [place], [place] [address]    Culture (A)  Final    60,000 COLONIES/mL KLEBSIELLA PNEUMONIAE Confirmed Extended Spectrum Beta-Lactamase Producer (ESBL).  In bloodstream infections from ESBL organisms, carbapenems are preferred over piperacillin/tazobactam. They are shown to have a lower risk of mortality. Two isolates with different morphologies were identified as the same organism.The most resistant organism was reported. Performed at [facility], [address], [place], [place] [address]    Report Status 02/12/2024 FINAL  Final   Organism ID, Bacteria KLEBSIELLA PNEUMONIAE (A)  Final      Susceptibility   Klebsiella pneumoniae - MIC*    AMPICILLIN >=32 RESISTANT Resistant     CEFAZOLIN  >=64 RESISTANT Resistant     CEFEPIME  >=32 RESISTANT Resistant     CEFTRIAXONE  >=64 RESISTANT Resistant     CIPROFLOXACIN <=0.25 SENSITIVE  Sensitive     GENTAMICIN >=16 RESISTANT Resistant     IMIPENEM <=0.25 SENSITIVE Sensitive     NITROFURANTOIN 64 INTERMEDIATE Intermediate     TRIMETH/SULFA <=20 SENSITIVE Sensitive     AMPICILLIN/SULBACTAM 16 INTERMEDIATE Intermediate     PIP/TAZO <=4 SENSITIVE Sensitive ug/mL    * 60,000 COLONIES/mL KLEBSIELLA PNEUMONIAE  Urine Culture     Status: Abnormal   Collection Time: 02/09/24 12:21 AM   Specimen: Urine, Random  Result Value Ref Range Status   Specimen Description   Final    URINE, RANDOM Performed at [facility], [address] [place], [place] [address]    Special Requests   Final    NONE Reflexed from [id number] Performed at [facility], [address] [place], [place] [address]    Culture (A)  Final    40,000 COLONIES/mL KLEBSIELLA PNEUMONIAE Confirmed Extended Spectrum Beta-Lactamase Producer (ESBL).  In bloodstream infections from ESBL organisms, carbapenems are preferred over piperacillin/tazobactam. They are shown to have a lower risk of mortality.    Report Status 02/12/2024 FINAL  Final   Organism ID, Bacteria KLEBSIELLA PNEUMONIAE (A)  Final      Susceptibility   Klebsiella pneumoniae - MIC*    AMPICILLIN >=32 RESISTANT Resistant     CEFAZOLIN  >=64 RESISTANT Resistant     CEFEPIME  >=32 RESISTANT Resistant     CEFTRIAXONE  >=64 RESISTANT Resistant     CIPROFLOXACIN 0.5 INTERMEDIATE Intermediate     GENTAMICIN >=16 RESISTANT Resistant     IMIPENEM <=0.25 SENSITIVE Sensitive     NITROFURANTOIN 64 INTERMEDIATE Intermediate     TRIMETH/SULFA <=20 SENSITIVE Sensitive     AMPICILLIN/SULBACTAM >=32 RESISTANT Resistant     PIP/TAZO <=4 SENSITIVE Sensitive ug/mL    * 40,000 COLONIES/mL KLEBSIELLA PNEUMONIAE    Radiology Studies: No results found.  Scheduled Meds:  atorvastatin   40 mg Oral QHS   benztropine   1 mg Oral BID   fluPHENAZine   20 mg Oral BID   lithium   carbonate  150 mg Oral QHS   LORazepam   0.5 mg Oral QID    lurasidone   120 mg Oral Daily   sertraline   150 mg Oral Daily   sodium bicarbonate  650 mg Oral BID   Continuous Infusions:  meropenem (MERREM) IV 1 g (02/12/24 1521)   sodium chloride  irrigation 0 mL (02/10/24 0715)    LOS: 2 days   Aura Leeds, DO [facility] Available via Epic secure chat 7am-7pm After these hours, please refer to coverage provider listed on [email or web address] 02/12/2024, 7:55 PM

## 2024-02-12 NOTE — Progress Notes (Signed)
 Mobility Specialist - Progress Note   02/12/24 1002  Mobility  Activity Transferred from bed to chair  Level of Assistance Moderate assist, patient does 50-74%  Assistive Device Front wheel walker  Distance Ambulated (ft) 1 ft  Range of Motion/Exercises Active  Activity Response Tolerated well  Mobility Referral Yes  Mobility visit 1 Mobility  Mobility Specialist Start Time (ACUTE ONLY) 0950  Mobility Specialist Stop Time (ACUTE ONLY) 1002  Mobility Specialist Time Calculation (min) (ACUTE ONLY) 12 min   Pt was found in bed and agreeable to transfer to recliner chair. At EOS was left with all needs met. Call bell in reach and RN in room.  Lorna Rose,  Mobility Specialist Can be reached via Secure Chat

## 2024-02-12 NOTE — Progress Notes (Addendum)
  Subjective: Complaining of abdominal pain, denies nausea vomiting tolerating diet.  Catheter in place although CBI urine clear yellow.  Objective: Vital signs in last 24 hours: Temp:  [98.4 F (36.9 C)-99.5 F (37.5 C)] 98.4 F (36.9 C) (06/17 0245) Pulse Rate:  [109-120] 109 (06/17 0245) Resp:  [18] 18 (06/17 0245) BP: (118-131)/(70-78) 131/78 (06/17 0245) SpO2:  [96 %-97 %] 97 % (06/17 0245)  Intake/Output from previous day: 06/16 0701 - 06/17 0700 In: 300 [P.O.:300] Out: 7750 [Urine:7750] Intake/Output this shift: Total I/O In: 120 [P.O.:120] Out: 3100 [Urine:3100]  Physical Exam:  General: Alert and oriented CV: RRR Lungs: Clear Abdomen: Soft, minimally tender, no rebound or guarding. GU: Three-way catheter in place on low rate CBI.  Urine clear yellow.  Lab Results: Recent Labs    02/10/24 0956 02/11/24 0958 02/12/24 1033  HGB 6.3* 8.6* 8.4*  HCT 19.4* 25.8* 26.3*   BMET Recent Labs    02/11/24 0958 02/12/24 1033  NA 137 136  K 4.1 3.5  CL 112* 110  CO2 16* 18*  GLUCOSE 117* 119*  BUN 26* 21  CREATININE 1.27* 1.08*  CALCIUM  9.3 9.8     Studies/Results: No results found.   Assessment/Plan: 73 year old female with schizoaffective disorder presented gross hematuria clot retention.  Gross hematuria: - CBI on slow gtt on rounds. Clear irrigant. Clamped at around noon. Reassess tomorrow am.  - Continue to hold Plavix  - Hematocrit 25.8-->26.3 this morning - Continue to monitor H&H  Tachycardia: -Concern for worsening heart failure history of arrhythmia - No evidence of bladder blood loss.  Urinary retention: - Bilateral hydronephrosis on CT likely resolved with clot irrigation - Continue catheter.  Urinary tract infection: -Urine culture positive for Klebsiella - Continue ceftriaxone     LOS: 2 days  Alla Ar, NP [facility] Pager: [phone]   Dr. Cathi Cluster addendum: Urine clear abdomen soft kidney function  improving.  CBI clamped.  Patient should be able to void trial prior to discharge.

## 2024-02-13 ENCOUNTER — Inpatient Hospital Stay (HOSPITAL_COMMUNITY)

## 2024-02-13 DIAGNOSIS — N320 Bladder-neck obstruction: Secondary | ICD-10-CM | POA: Diagnosis not present

## 2024-02-13 LAB — CBC WITH DIFFERENTIAL/PLATELET
Abs Immature Granulocytes: 0.68 10*3/uL — ABNORMAL HIGH (ref 0.00–0.07)
Abs Immature Granulocytes: 0.67 10*3/uL — ABNORMAL HIGH (ref 0.00–0.07)
Basophils Absolute: 0.1 10*3/uL (ref 0.0–0.1)
Basophils Absolute: 0.1 10*3/uL (ref 0.0–0.1)
Basophils Relative: 0 %
Basophils Relative: 0 %
Eosinophils Absolute: 0.1 10*3/uL (ref 0.0–0.5)
Eosinophils Absolute: 0.2 10*3/uL (ref 0.0–0.5)
Eosinophils Relative: 0 %
Eosinophils Relative: 1 %
HCT: 23.3 % — ABNORMAL LOW (ref 36.0–46.0)
HCT: 21.5 % — ABNORMAL LOW (ref 36.0–46.0)
Hemoglobin: 7.4 g/dL — ABNORMAL LOW (ref 12.0–15.0)
Hemoglobin: 7 g/dL — ABNORMAL LOW (ref 12.0–15.0)
Immature Granulocytes: 3 %
Immature Granulocytes: 3 %
Lymphocytes Relative: 5 %
Lymphocytes Relative: 10 %
Lymphs Abs: 1.4 10*3/uL (ref 0.7–4.0)
Lymphs Abs: 2.4 10*3/uL (ref 0.7–4.0)
MCH: 29.6 pg (ref 26.0–34.0)
MCH: 28.8 pg (ref 26.0–34.0)
MCHC: 31.8 g/dL (ref 30.0–36.0)
MCHC: 32.6 g/dL (ref 30.0–36.0)
MCV: 93.2 fL (ref 80.0–100.0)
MCV: 88.5 fL (ref 80.0–100.0)
Monocytes Absolute: 1.1 10*3/uL — ABNORMAL HIGH (ref 0.1–1.0)
Monocytes Absolute: 1.8 10*3/uL — ABNORMAL HIGH (ref 0.1–1.0)
Monocytes Relative: 4 %
Monocytes Relative: 7 %
Neutro Abs: 24.2 10*3/uL — ABNORMAL HIGH (ref 1.7–7.7)
Neutro Abs: 20.5 10*3/uL — ABNORMAL HIGH (ref 1.7–7.7)
Neutrophils Relative %: 88 %
Neutrophils Relative %: 79 %
Platelets: 333 10*3/uL (ref 150–400)
Platelets: 270 10*3/uL (ref 150–400)
RBC: 2.5 MIL/uL — ABNORMAL LOW (ref 3.87–5.11)
RBC: 2.43 MIL/uL — ABNORMAL LOW (ref 3.87–5.11)
RDW: 15.9 % — ABNORMAL HIGH (ref 11.5–15.5)
RDW: 15.6 % — ABNORMAL HIGH (ref 11.5–15.5)
WBC: 27.5 10*3/uL — ABNORMAL HIGH (ref 4.0–10.5)
WBC: 25.7 10*3/uL — ABNORMAL HIGH (ref 4.0–10.5)
nRBC: 0 % (ref 0.0–0.2)
nRBC: 0 % (ref 0.0–0.2)

## 2024-02-13 LAB — COMPREHENSIVE METABOLIC PANEL WITH GFR
ALT: 131 U/L — ABNORMAL HIGH (ref 0–44)
AST: 191 U/L — ABNORMAL HIGH (ref 15–41)
Albumin: 2 g/dL — ABNORMAL LOW (ref 3.5–5.0)
Alkaline Phosphatase: 157 U/L — ABNORMAL HIGH (ref 38–126)
Anion gap: 8 (ref 5–15)
BUN: 18 mg/dL (ref 8–23)
CO2: 16 mmol/L — ABNORMAL LOW (ref 22–32)
Calcium: 9.5 mg/dL (ref 8.9–10.3)
Chloride: 115 mmol/L — ABNORMAL HIGH (ref 98–111)
Creatinine, Ser: 1.06 mg/dL — ABNORMAL HIGH (ref 0.44–1.00)
GFR, Estimated: 56 mL/min — ABNORMAL LOW (ref >60)
Glucose, Bld: 99 mg/dL (ref 70–99)
Potassium: 3.7 mmol/L (ref 3.5–5.1)
Sodium: 139 mmol/L (ref 135–145)
Total Bilirubin: 0.7 mg/dL (ref 0.0–1.2)
Total Protein: 5.8 g/dL — ABNORMAL LOW (ref 6.5–8.1)

## 2024-02-13 LAB — HEPATITIS PANEL, ACUTE
HCV Ab: NONREACTIVE
Hep A IgM: NONREACTIVE
Hep B C IgM: NONREACTIVE
Hepatitis B Surface Ag: NONREACTIVE

## 2024-02-13 LAB — MAGNESIUM: Magnesium: 2 mg/dL (ref 1.7–2.4)

## 2024-02-13 LAB — PHOSPHORUS: Phosphorus: 2.3 mg/dL — ABNORMAL LOW (ref 2.5–4.6)

## 2024-02-13 MED ORDER — SODIUM CHLORIDE (PF) 0.9 % IJ SOLN
INTRAMUSCULAR | Status: AC
Start: 2024-02-13 — End: 2024-02-13
  Filled 2024-02-13: qty 250

## 2024-02-13 MED ORDER — IOHEXOL 300 MG/ML  SOLN
100.0000 mL | Freq: Once | INTRAMUSCULAR | Status: AC | PRN
  Administered 2024-02-13: 100 mL via INTRAVENOUS

## 2024-02-13 MED ORDER — CHLORHEXIDINE GLUCONATE CLOTH 2 % EX PADS
6.0000 | MEDICATED_PAD | Freq: Every day | CUTANEOUS | Status: DC
  Administered 2024-02-14 – 2024-02-20 (×6): 6 via TOPICAL

## 2024-02-13 NOTE — Progress Notes (Signed)
 PROGRESS NOTE    Heidi Blevins  NGE:[id number] DOB: [birth year] DOA: 02/09/2024 PCP: Pcp, No   Brief Narrative:  The patient is a 73 year old African-American female with past medical history significant for Mannam to essential hypertension, multiple UTIs, history of CVA, history of schizoaffective disorder, depression and anxiety, insomnia, CKD stage IIIa, chronic diastolic CHF, history of left renal mass with anemia and other comorbidities who presented to the ED for evaluation of lower abdominal pain, vaginal itching and pain as well as hematuria.  She started having abdominal pain last night and had some dysuria, hematuria and vaginal itching and had some associated nausea, fevers and chills.  Further workup was done and UA was too bloody to report adequately but showed greater than 50 WBCs.  CT of the abdomen pelvis was done and showed distended bladder with hemorrhage and mild bilateral hydroureteronephrosis.  Received IM Cogentin  1 mg, fentanyl , Zofran , Rocephin  and urology was consulted and she was placed on IV fluid hydration.   Since last night her blood count has dropped and she has been to be typed and screened and transfused 2 units of PRBCs.  After the 2 units of blood count is improved and is stable at 8.4.  She continues to have a metabolic acidosis and WBC remains elevated. Changed Abx to IV Meropenem  Assessment & Plan:   Principal Problem:   Bladder obstruction Active Problems:   Hydroureteronephrosis   Anemia   Bladder hemorrhage   ABLA (acute blood loss anemia)  Bladder obstruction and Bilateral Hydroureteronephrosis  - Patient with recently diagnosed left renal mass concerning for RCC presented with 1 day of hematuria and lower abdominal pain. Found to have abdominal distention, lower abdominal pain and gross hematuria on admission - CT A/P shows distended bladder with hemorrhage as well as mild bilateral hydroureteronephrosis extending to the UVJ - Urology  consulted, recommended three-way Foley catheter and bladder irrigation, this was completed by EDP; Urology recommends continuing catheter drainage at this time- Continue Foley catheter drainage.   Acute Cystitis versus Pyelonephritis urine culture growing ESBL Klebsiella so antibiotics switched from Rocephin  to meropenem.   Left Renal Mass: Patient found to have a 2.6 x 3.0 cm left renal mass on imaging earlier this year. Followed by oncology and urology.  Urology following, appreciate recs and she has follow-up imaging planned next month;    Acute Blood Loss Anemia superimposed on Chronic Anemia secondary to gross hematuria - Followed by oncology in outpatient setting and underwent Bone Marrow Bx where pathology did not indicate underlying bone marrow disorder; Has undergone extensive workup for Anemia and it was felt her Anemia was an Inflammatory Process - Hgb of 8.5 on admission, dropped to 6.3 on 02/10/2024 for which she received 2 units of.  Recent transfusion, hemoglobin over 8 posttransfusion but dropped to 7.4.  Urology is now suspecting bleeding from the kidney, CT renal is ordered with contrast by them today.  Monitor H&H every 12 hours and transfuse if less than 7.    AKI on CKD Stage 3a/ Metabolic Acidosis: Baseline creatinine between 0.9-1.05.  Creatinine peaked at 1.4.  She received IV fluids.  Creatinine now down to 1.06 which is her baseline.   Essential HTN: BP initially elevated on admission, currently blood pressure is normal despite of holding antihypertensives so we will continue to hold for now.    Anxiety and depression Schizoaffective disorder - Lithium  level subtherapeutic at 0.14 - Continue lithium , benztropine , lurasidone , fluphenazine  - Continue sertraline  and lorazepam   Abnormal LFTs/ Elevated LFTs: Elevated on Admission and had normalize but now Worsened again.  Will discontinue atorvastatin .  Avoid hepatotoxic agents.   Hx of CVA  HLD Plavix  on hold in the  setting of active bleeding.  Will now hold statin as well.  DVT prophylaxis: SCDs Start: 02/10/24 0225   Code Status: Limited: Do not attempt resuscitation (DNR) -DNR-LIMITED -Do Not Intubate/DNI   Family Communication:  None present at bedside.   Status is: Inpatient Remains inpatient appropriate because: Actively bleeding and requiring blood transfusion.   Estimated body mass index is 21.44 kg/m as calculated from the following:   Height as of this encounter: 5' (1.524 m).   Weight as of this encounter: 49.8 kg.    Nutritional Assessment: Body mass index is 21.44 kg/m.Aaron Aas Seen by dietician.  I agree with the assessment and plan as outlined below: Nutrition Status:        . Skin Assessment: I have examined the patient's skin and I agree with the wound assessment as performed by the wound care RN as outlined below:    Consultants:  Urology  Procedures:  None  Antimicrobials:  Anti-infectives (From admission, onward)    Start     Dose/Rate Route Frequency Ordered Stop   02/12/24 1400  meropenem (MERREM) 1 g in sodium chloride  0.9 % 100 mL IVPB        1 g 200 mL/hr over 30 Minutes Intravenous Every 12 hours 02/12/24 1254     02/10/24 2300  cefTRIAXone  (ROCEPHIN ) 1 g in sodium chloride  0.9 % 100 mL IVPB  Status:  Discontinued        1 g 200 mL/hr over 30 Minutes Intravenous Every 24 hours 02/10/24 0247 02/12/24 1254   02/09/24 2315  cefTRIAXone  (ROCEPHIN ) 2 g in sodium chloride  0.9 % 100 mL IVPB        2 g 200 mL/hr over 30 Minutes Intravenous  Once 02/09/24 2306 02/09/24 2358         Subjective: Patient seen and examined.  She is alert but partially oriented.  Appears comfortable without any complaints.  Objective: Vitals:   02/12/24 1300 02/12/24 2024 02/12/24 2100 02/13/24 0428  BP: 122/83 135/88  130/83  Pulse: (!) 103 (!) 55  (!) 110  Resp: 19 18  19   Temp: 99.8 F (37.7 C) 98.3 F (36.8 C)  99.2 F (37.3 C)  TempSrc:  Oral  Oral  SpO2: 97% (!) 83%  100% 97%  Weight:      Height:        Intake/Output Summary (Last 24 hours) at 02/13/2024 1015 Last data filed at 02/13/2024 0800 Gross per 24 hour  Intake 760 ml  Output 6200 ml  Net -5440 ml   Filed Weights   02/09/24 1946 02/10/24 0356  Weight: 49.9 kg 49.8 kg    Examination:  General exam: Appears calm and comfortable  Respiratory system: Clear to auscultation. Respiratory effort normal. Cardiovascular system: S1 & S2 heard, RRR. No JVD, murmurs, rubs, gallops or clicks. No pedal edema. Gastrointestinal system: Abdomen is soft, distended but nontender.  No organomegaly or masses felt. Normal bowel sounds heard. Central nervous system: Alert and oriented x 2. No focal neurological deficits. Extremities: Symmetric 5 x 5 power. Skin: No rashes, lesions or ulcers  Data Reviewed: I have personally reviewed following labs and imaging studies  CBC: Recent Labs  Lab 02/09/24 2029 02/10/24 0956 02/11/24 0958 02/12/24 1033 02/13/24 0420  WBC 20.3* 48.1* 34.0* 37.0* 27.5*  NEUTROABS  16.7* 42.3* 30.5* 33.1* 24.2*  HGB 8.5* 6.3* 8.6* 8.4* 7.4*  HCT 27.5* 19.4* 25.8* 26.3* 23.3*  MCV 95.8 95.6 89.6 91.6 93.2  PLT 514* 417* 379 398 333   Basic Metabolic Panel: Recent Labs  Lab 02/09/24 2029 02/10/24 0956 02/11/24 0958 02/12/24 1033 02/13/24 0420  NA 131* 131* 137 136 139  K 4.0 4.7 4.1 3.5 3.7  CL 104 107 112* 110 115*  CO2 17* 16* 16* 18* 16*  GLUCOSE 198* 144* 117* 119* 99  BUN 23 23 26* 21 18  CREATININE 1.09* 1.39* 1.27* 1.08* 1.06*  CALCIUM  9.5 9.0 9.3 9.8 9.5  MG  --  1.8 2.0 2.1 2.0  PHOS  --  3.5 3.4 2.6 2.3*   GFR: Estimated Creatinine Clearance: 34.5 mL/min (A) (by C-G formula based on SCr of 1.06 mg/dL (H)). Liver Function Tests: Recent Labs  Lab 02/09/24 2029 02/10/24 0956 02/11/24 0958 02/12/24 1033 02/13/24 0420  AST 45* 28 41 148* 191*  ALT 48* 33 35 84* 131*  ALKPHOS 230* 140* 149* 178* 157*  BILITOT 0.2 0.2 0.5 0.6 0.7  PROT 7.7 5.9*  5.8* 6.6 5.8*  ALBUMIN 2.8* 2.2* 2.1* 2.2* 2.0*   No results for input(s): LIPASE, AMYLASE in the last 168 hours. No results for input(s): AMMONIA in the last 168 hours. Coagulation Profile: No results for input(s): INR, PROTIME in the last 168 hours. Cardiac Enzymes: No results for input(s): CKTOTAL, CKMB, CKMBINDEX, TROPONINI in the last 168 hours. BNP (last 3 results) No results for input(s): PROBNP in the last 8760 hours. HbA1C: No results for input(s): HGBA1C in the last 72 hours. CBG: No results for input(s): GLUCAP in the last 168 hours. Lipid Profile: No results for input(s): CHOL, HDL, LDLCALC, TRIG, CHOLHDL, LDLDIRECT in the last 72 hours. Thyroid Function Tests: No results for input(s): TSH, T4TOTAL, FREET4, T3FREE, THYROIDAB in the last 72 hours. Anemia Panel: No results for input(s): VITAMINB12, FOLATE, FERRITIN, TIBC, IRON, RETICCTPCT in the last 72 hours. Sepsis Labs: No results for input(s): PROCALCITON, LATICACIDVEN in the last 168 hours.  Recent Results (from the past 240 hours)  Urine Culture     Status: Abnormal   Collection Time: 02/09/24 12:21 AM   Specimen: In/Out Cath Urine  Result Value Ref Range Status   Specimen Description   Final    IN/OUT CATH URINE Performed at [facility], [address], [place], [place] [address]    Special Requests   Final    NONE Performed at [facility], [address], [place], [place] [address]    Culture (A)  Final    60,000 COLONIES/mL KLEBSIELLA PNEUMONIAE Confirmed Extended Spectrum Beta-Lactamase Producer (ESBL).  In bloodstream infections from ESBL organisms, carbapenems are preferred over piperacillin/tazobactam. They are shown to have a lower risk of mortality. Two isolates with different morphologies were identified as the same organism.The most resistant organism was reported. Performed at [facility], [address], [place], [place] [address]    Report Status 02/12/2024 FINAL  Final   Organism ID, Bacteria KLEBSIELLA PNEUMONIAE (A)  Final      Susceptibility   Klebsiella pneumoniae - MIC*    AMPICILLIN >=32 RESISTANT Resistant     CEFAZOLIN  >=64 RESISTANT Resistant     CEFEPIME  >=32 RESISTANT Resistant     CEFTRIAXONE  >=64 RESISTANT Resistant     CIPROFLOXACIN <=0.25 SENSITIVE Sensitive     GENTAMICIN >=16 RESISTANT Resistant     IMIPENEM <=0.25 SENSITIVE  Sensitive     NITROFURANTOIN 64 INTERMEDIATE Intermediate     TRIMETH/SULFA <=20 SENSITIVE Sensitive     AMPICILLIN/SULBACTAM 16 INTERMEDIATE Intermediate     PIP/TAZO <=4 SENSITIVE Sensitive ug/mL    * 60,000 COLONIES/mL KLEBSIELLA PNEUMONIAE  Urine Culture     Status: Abnormal   Collection Time: 02/09/24 12:21 AM   Specimen: Urine, Random  Result Value Ref Range Status   Specimen Description   Final    URINE, RANDOM Performed at [facility], [address], [place], [place] [address]    Special Requests   Final    NONE Reflexed from [id number] Performed at [facility], [address], [place], [place] [address]    Culture (A)  Final    40,000 COLONIES/mL KLEBSIELLA PNEUMONIAE Confirmed Extended Spectrum Beta-Lactamase Producer (ESBL).  In bloodstream infections from ESBL organisms, carbapenems are preferred over piperacillin/tazobactam. They are shown to have a lower risk of mortality.    Report Status 02/12/2024 FINAL  Final   Organism ID, Bacteria KLEBSIELLA PNEUMONIAE (A)  Final      Susceptibility   Klebsiella pneumoniae - MIC*    AMPICILLIN >=32 RESISTANT Resistant     CEFAZOLIN  >=64 RESISTANT Resistant     CEFEPIME  >=32 RESISTANT Resistant     CEFTRIAXONE  >=64 RESISTANT Resistant     CIPROFLOXACIN 0.5 INTERMEDIATE Intermediate     GENTAMICIN >=16 RESISTANT Resistant     IMIPENEM <=0.25 SENSITIVE Sensitive     NITROFURANTOIN 64 INTERMEDIATE Intermediate     TRIMETH/SULFA  <=20 SENSITIVE Sensitive     AMPICILLIN/SULBACTAM >=32 RESISTANT Resistant     PIP/TAZO <=4 SENSITIVE Sensitive ug/mL    * 40,000 COLONIES/mL KLEBSIELLA PNEUMONIAE  Culture, blood (Routine X 2) w Reflex to ID Panel     Status: None (Preliminary result)   Collection Time: 02/12/24  8:48 PM   Specimen: BLOOD  Result Value Ref Range Status   Specimen Description   Final    BLOOD BLOOD RIGHT ARM Performed at [facility], [address], [place], [place] [address]    Special Requests   Final    BOTTLES DRAWN AEROBIC ONLY Blood Culture results may not be optimal due to an inadequate volume of blood received in culture bottles Performed at [facility], [address], [place], [place] [address]    Culture   Final    NO GROWTH < 12 HOURS Performed at [facility], [address], [place], [place] [address]    Report Status PENDING  Incomplete  Culture, blood (Routine X 2) w Reflex to ID Panel     Status: None (Preliminary result)   Collection Time: 02/12/24  9:05 PM   Specimen: BLOOD LEFT HAND  Result Value Ref Range Status   Specimen Description   Final    BLOOD LEFT HAND Performed at [facility], [address] [place], [place] [address]    Special Requests   Final    BOTTLES DRAWN AEROBIC ONLY Blood Culture results may not be optimal due to an inadequate volume of blood received in culture bottles Performed at [facility], [address], [place], [place] [address]    Culture   Final    NO GROWTH < 12 HOURS Performed at [facility], [address], [place], [place] [address]    Report Status PENDING  Incomplete     Radiology Studies: US  Abdomen Limited RUQ (LIVER/GB) Result Date: 02/13/2024 CLINICAL  DATA:  Abnormal liver function tests. EXAM: ULTRASOUND ABDOMEN LIMITED RIGHT UPPER QUADRANT COMPARISON:  CT abdomen pelvis 02/09/2024. FINDINGS: Gallbladder: No gallstones or wall thickening visualized.  No sonographic Murphy sign noted by sonographer. Common bile duct: Diameter: 3 mm, within normal limits. No intrahepatic biliary ductal dilatation. Liver: Liver margin is irregular. Small perihepatic ascites. Portal vein is patent on color Doppler imaging with normal direction of blood flow towards the liver. Other: None. IMPRESSION: 1. No acute findings. 2. Irregular liver contour suggests cirrhosis. Please correlate clinically. 3. Small perihepatic ascites. Electronically Signed   By: Shearon Denis M.D.   On: 02/13/2024 09:56   DG CHEST PORT 1 VIEW Result Date: 02/13/2024 CLINICAL DATA:  Leukocytosis. EXAM: PORTABLE CHEST 1 VIEW COMPARISON:  09/22/2023 FINDINGS: The cardio pericardial silhouette is enlarged. Left lung clear. Interval development of collapse/consolidation with probable small effusion at the right base, suspicious for pneumonia. Bones are diffusely demineralized. Thoracolumbar fusion hardware evident with underlying scoliosis. IMPRESSION: Interval development of collapse/consolidation with probable small effusion at the right base, suspicious for pneumonia. Electronically Signed   By: Donnal Fusi M.D.   On: 02/13/2024 09:42    Scheduled Meds:  atorvastatin   40 mg Oral QHS   benztropine   1 mg Oral BID   fluPHENAZine   20 mg Oral BID   lithium  carbonate  150 mg Oral QHS   LORazepam   0.5 mg Oral QID   lurasidone   120 mg Oral Daily   sertraline   150 mg Oral Daily   sodium bicarbonate  650 mg Oral BID   Continuous Infusions:  meropenem (MERREM) IV 1 g (02/12/24 2151)   sodium chloride  irrigation 0 mL (02/10/24 0715)     LOS: 3 days   Modena Andes, MD [facility]  02/13/2024, 10:15 AM   *Please note that this is a verbal dictation therefore any spelling or grammatical errors are due to the Dragon Medical One system interpretation.  Please page via Amion and do not message via secure chat for urgent patient care matters. Secure chat can be used for non urgent  patient care matters.  How to contact the [facility] Attending or Consulting provider 7A - 7P or covering provider during after hours 7P -7A, for this patient?  Check the care team in [facility] and look for a) attending/consulting [facility] provider listed and b) the [facility] team listed. Page or secure chat 7A-7P. Log into [email or web address] and use [facility] universal password to access. If you do not have the password, please contact the hospital operator. Locate the [facility] provider you are looking for under [facility] and page to a number that you can be directly reached. If you still have difficulty reaching the provider, please page the [facility] (Director on Call) for the Hospitalists listed on amion for assistance.

## 2024-02-13 NOTE — Plan of Care (Signed)

## 2024-02-13 NOTE — Progress Notes (Signed)
 Mobility Specialist - Progress Note   02/13/24 0922  Mobility  Activity Transferred from bed to chair  Level of Assistance Moderate assist, patient does 50-74%  Assistive Device Front wheel walker  Distance Ambulated (ft) 1 ft  Range of Motion/Exercises Active  Activity Response Tolerated well  Mobility Referral Yes  Mobility visit 1 Mobility  Mobility Specialist Start Time (ACUTE ONLY) 0910  Mobility Specialist Stop Time (ACUTE ONLY) [id number]  Mobility Specialist Time Calculation (min) (ACUTE ONLY) 12 min   Pt was found in bed and agreeable to transfer to recliner chair. Stated feeling a little weaker today than yesterday. At EOS was left on recliner chair with all needs met. Call bell in reach and chair alarm on.  Lorna Rose,  Mobility Specialist Can be reached via Secure Chat

## 2024-02-13 NOTE — Progress Notes (Signed)
 Occupational Therapy Treatment Patient Details Name: Heidi NASE MRN: [id number] DOB: 01-21-1951 Today's Date: 02/13/2024   History of present illness Heidi Blevins is a 73 y.o. female with medical history significant for hypertension, multiple UTIs, CVA, schizoaffective disorder, depression, anxiety, insomnia, CKD 3A, chronic diastolic heart failure, left renal mass and anemia who presented to the ED for evaluation of lower abdominal pain, vaginal itching/pain and hematuria. Admitted to [facility] with bladder obstruction and acute cystitis   OT comments  Patient seen for skilled OT session session this am. Already seen but mobility for OOB and was in recliner resting upon OT arrival. Noted increased fatigue and SOB but SpO2 100% on RA and offered increased support for BADL's. MD assessed as well during session. Repositioned at end of session for improved respirations and swallowing due to significant kyphosis. Will continue to follow acutely to allow for discharge with recommendation to return to ALF with support and assistance as family reported patient performed better than baseline with HHOT services.       If plan is discharge home, recommend the following:  A lot of help with walking and/or transfers;A lot of help with bathing/dressing/bathroom;Assistance with cooking/housework;Assistance with feeding;Direct supervision/assist for medications management;Direct supervision/assist for financial management;Assist for transportation;Help with stairs or ramp for entrance;Supervision due to cognitive status   Equipment Recommendations  Other (comment);Wheelchair (measurements OT);Wheelchair cushion (measurements OT) (may benefit from w/c for staff transport as they had been using rollator)       Precautions / Restrictions Precautions Precautions: Fall Recall of Precautions/Restrictions: Intact Precaution/Restrictions Comments: bladder irigation Restrictions Weight Bearing Restrictions  Per Provider Order: No       Mobility Bed Mobility Overal bed mobility:  (up in recliner and remained)                  Transfers Overall transfer level: Needs assistance Equipment used: Rolling walker (2 wheels) Transfers: Sit to/from Stand, Bed to chair/wheelchair/BSC Sit to Stand: Min assist     Step pivot transfers: Min assist     General transfer comment: increased time and cues this session for hand placement and lines management     Balance Overall balance assessment: Needs assistance Sitting-balance support: Feet supported Sitting balance-Leahy Scale: Fair     Standing balance support: Reliant on assistive device for balance, During functional activity, Bilateral upper extremity supported Standing balance-Leahy Scale: Poor                             ADL either performed or assessed with clinical judgement   ADL Overall ADL's : Needs assistance/impaired Eating/Feeding: Set up (liquid diet)   Grooming: Wash/dry hands;Wash/dry face;Oral care;Set up;Sitting   Upper Body Bathing: Contact guard assist;Sitting   Lower Body Bathing: Maximal assistance;Sit to/from stand   Upper Body Dressing : Minimal assistance;Sitting;Cueing for sequencing   Lower Body Dressing: Maximal assistance;Sit to/from stand;Cueing for sequencing;Cueing for safety   Toilet Transfer: Minimal assistance;BSC/3in1;Rolling walker (2 wheels)   Toileting- Clothing Manipulation and Hygiene: Maximal assistance;Total assistance;Sit to/from stand Toileting - Clothing Manipulation Details (indicate cue type and reason): bladder scanning     Functional mobility during ADLs: Minimal assistance;Rolling walker (2 wheels) General ADL Comments: increased drowsiness this session intermittently    Extremity/Trunk Assessment Upper Extremity Assessment Upper Extremity Assessment: Generalized weakness;Right hand dominant   Lower Extremity Assessment Lower Extremity Assessment:  Generalized weakness        Vision   Vision Assessment?: No apparent visual  deficits Additional Comments: worked on [employer] on [employer] with no issues but slower processing this sesison         Communication Communication Communication: No apparent difficulties   Cognition Arousal: Alert, Lethargic Behavior During Therapy: WFL for tasks assessed/performed Cognition: Cognition impaired   Orientation impairments: Time Awareness: Online awareness impaired, Intellectual awareness impaired Memory impairment (select all impairments): Short-term memory     OT - Cognition Comments: increased drowsiness this session with increased processing time required                 Following commands: Intact        Cueing   Cueing Techniques: Verbal cues        General Comments appeared increased DOE RR 22 but O2 100% on RA    Pertinent Vitals/ Pain       Pain Assessment Pain Assessment: Faces Faces Pain Scale: No hurt Pain Intervention(s): Monitored during session   Frequency  Min 2X/week        Progress Toward Goals  OT Goals(current goals can now be found in the care plan section)  Progress towards OT goals: Progressing toward goals  Acute Rehab OT Goals Patient Stated Goal: to feel stronger OT Goal Formulation: With patient/family Time For Goal Achievement: 02/25/24 Potential to Achieve Goals: Good ADL Goals Pt Will Perform Grooming: with set-up;sitting Pt Will Perform Upper Body Bathing: with set-up;sitting Pt Will Perform Lower Body Bathing: with min assist;sit to/from stand Pt Will Perform Upper Body Dressing: with contact guard assist;sitting Pt Will Perform Lower Body Dressing: with mod assist;sit to/from stand Pt Will Transfer to Toilet: with contact guard assist;bedside commode Pt Will Perform Toileting - Clothing Manipulation and hygiene: with min assist;sit to/from stand Pt/caregiver will Perform Home Exercise Program: Increased strength;With  Supervision;With written HEP provided;Both right and left upper extremity  Plan         AM-PAC OT 6 Clicks Daily Activity     Outcome Measure   Help from another person eating meals?: A Little Help from another person taking care of personal grooming?: A Little (now liquids) Help from another person toileting, which includes using toliet, bedpan, or urinal?: A Lot Help from another person bathing (including washing, rinsing, drying)?: A Lot Help from another person to put on and taking off regular upper body clothing?: A Little Help from another person to put on and taking off regular lower body clothing?: A Lot 6 Click Score: 15    End of Session Equipment Utilized During Treatment: Gait belt;Rolling walker (2 wheels)  OT Visit Diagnosis: Unsteadiness on feet (R26.81);Muscle weakness (generalized) (M62.81);History of falling (Z91.81)   Activity Tolerance Patient limited by fatigue   Patient Left in chair;with call bell/phone within reach;with chair alarm set   Nurse Communication Mobility status        Time: 0950-1020 OT Time Calculation (min): 30 min  Charges: OT General Charges $OT Visit: 1 Visit OT Treatments $Self Care/Home Management : 23-37 mins  Zharia Conrow OT/L Acute Rehabilitation Department  [phone]  02/13/2024, 12:25 PM

## 2024-02-13 NOTE — Progress Notes (Signed)
   02/12/24 2200  Urethral Catheter Darlynn Elam, MD Triple-lumen 24 Fr.  Placement Date/Time: 02/10/24 1300   Inserted prior to hospital arrival?: No  Inserted prior to unit arrival?: No  Perineal care performed prior to insertion?: Yes  Person Inserting LDA: Darlynn Elam, MD  Person Assisting with Catheter Insertion: Tas...  Input (mL) 200 mL   Pt foley leaking, noticed clots in tube. Irrigated twice last night. Tolerated well. Foley bag now clear yellow.

## 2024-02-13 NOTE — Progress Notes (Signed)
 GH overnight requiring several irrigations. Concerns that bleeding is from kidney. Previous CT shows slight delayed nephrogram. Will get CT with delayed contrast.

## 2024-02-13 NOTE — Progress Notes (Signed)
 Subjective: Sleeping , no pain, nurse has not been required to irrigate the patient.   Objective: Vital signs in last 24 hours: Temp:  [97.6 F (36.4 C)-99.2 F (37.3 C)] 97.6 F (36.4 C) (06/18 1221) Pulse Rate:  [55-110] 89 (06/18 1221) Resp:  [18-19] 18 (06/18 1221) BP: (94-135)/(60-88) 94/60 (06/18 1221) SpO2:  [83 %-100 %] 98 % (06/18 1221)  Intake/Output from previous day: 06/17 0701 - 06/18 0700 In: 640 [P.O.:240; IV Piggyback:200] Out: 82956 [Urine:10680] Intake/Output this shift: Total I/O In: 240 [P.O.:240] Out: 2100 [Urine:2100]  Physical Exam:  General: Alert and oriented CV: RRR Lungs: Clear Abdomen: Soft, ND, ATTP;  GU: urine clear yellow off CBI.   Lab Results: Recent Labs    02/11/24 0958 02/12/24 1033 02/13/24 0420  HGB 8.6* 8.4* 7.4*  HCT 25.8* 26.3* 23.3*   BMET Recent Labs    02/12/24 1033 02/13/24 0420  NA 136 139  K 3.5 3.7  CL 110 115*  CO2 18* 16*  GLUCOSE 119* 99  BUN 21 18  CREATININE 1.08* 1.06*  CALCIUM  9.8 9.5     Studies/Results: CT HEMATURIA WORKUP Result Date: 02/13/2024 CLINICAL DATA:  Lower abdominal pain and hematuria. EXAM: CT ABDOMEN AND PELVIS WITHOUT AND WITH CONTRAST TECHNIQUE: Multidetector CT imaging of the abdomen and pelvis was performed following the standard protocol before and following the bolus administration of intravenous contrast. RADIATION DOSE REDUCTION: This exam was performed according to the departmental dose-optimization program which includes automated exposure control, adjustment of the mA and/or kV according to patient size and/or use of iterative reconstruction technique. CONTRAST:  OMNIPAQUE  IOHEXOL  300 MG/ML  SOLN COMPARISON:  02/09/2024 FINDINGS: Despite efforts by the technologist and patient, motion artifact is present on today's exam and could not be eliminated. This reduces exam sensitivity and specificity. Lower chest: Aortic and dense coronary atherosclerosis. Small pericardial  effusion. Mild cardiomegaly. Small amount of fluid in the left major fissure. Linear subsegmental atelectasis in the right lower lobe. Hepatobiliary: Small calcification dependently in the gallbladder suspicious for cholelithiasis. Otherwise unremarkable. Pancreas: Unremarkable Spleen: Unremarkable Adrenals/Urinary Tract: Stable scarring in the right kidney. At least partially duplicated right renal collecting system. No current hydronephrosis. Foley catheter is present in the urinary bladder which is otherwise collapsed. Adrenal glands unremarkable. Stomach/Bowel: Prominent stool throughout the colon favors constipation. Mild prominence of scattered gas in the small bowel without overtly dilated small bowel. Assessment of bowel significantly adversely affected by motion artifact. Vascular/Lymphatic: Atherosclerosis is present, including aortoiliac atherosclerotic disease. Considerable atheromatous plaque at the origin of the celiac trunk. The celiac trunk opacifies, accordingly is not felt to be totally occluded. Lesser atheromatous plaque at the origin of the SMA. Reproductive: Unremarkable Other: Trace free pelvic fluid. Musculoskeletal: Markedly severe dextroconvex thoracic and moderate levoconvex lumbar scoliosis with postoperative findings in the spine. Healing left rib fractures similar to previous. Stable vertical fractures of the sacral ala with transverse sclerotic healing fracture at the S2 vertebral level compatible with sacral insufficiency fractures. Left total hip prosthesis with cerclage wires. Stable appearance of fractures of the right pubic rami and right pubic body. Transverse sclerosis in the T10 vertebral body compatible with fracture, no change from previous. Substantial degenerative endplate findings at L4-5 and L5-S1 associated with spondylosis and foraminal narrowing at both levels. There is also right foraminal stenosis at L3-4. IMPRESSION: 1. Prominent stool throughout the colon favors  constipation. 2. Foley catheter is present in the urinary bladder which is otherwise collapsed. No current hydronephrosis. Stable scarring in  the right kidney. 3. Small pericardial effusion. Mild cardiomegaly. 4. Cholelithiasis. 5. Stable appearance of fractures of the sacral ala, S2 vertebral level, right pubic rami, and right pubic body. 6. Markedly severe dextroconvex thoracic and moderate levoconvex lumbar scoliosis with postoperative findings in the spine. 7. Substantial degenerative endplate findings at L4-5 and L5-S1 associated with spondylosis and foraminal narrowing at both levels. There is also right foraminal stenosis at L3-4. 8. Trace free pelvic fluid. 9. Aortic Atherosclerosis (ICD10-I70.0). Coronary and systemic atherosclerosis. Electronically Signed   By: Freida Jes M.D.   On: 02/13/2024 14:16   US  Abdomen Limited RUQ (LIVER/GB) Result Date: 02/13/2024 CLINICAL DATA:  Abnormal liver function tests. EXAM: ULTRASOUND ABDOMEN LIMITED RIGHT UPPER QUADRANT COMPARISON:  CT abdomen pelvis 02/09/2024. FINDINGS: Gallbladder: No gallstones or wall thickening visualized. No sonographic Murphy sign noted by sonographer. Common bile duct: Diameter: 3 mm, within normal limits. No intrahepatic biliary ductal dilatation. Liver: Liver margin is irregular. Small perihepatic ascites. Portal vein is patent on color Doppler imaging with normal direction of blood flow towards the liver. Other: None. IMPRESSION: 1. No acute findings. 2. Irregular liver contour suggests cirrhosis. Please correlate clinically. 3. Small perihepatic ascites. Electronically Signed   By: Shearon Denis M.D.   On: 02/13/2024 09:56   DG CHEST PORT 1 VIEW Result Date: 02/13/2024 CLINICAL DATA:  Leukocytosis. EXAM: PORTABLE CHEST 1 VIEW COMPARISON:  09/22/2023 FINDINGS: The cardio pericardial silhouette is enlarged. Left lung clear. Interval development of collapse/consolidation with probable small effusion at the right base,  suspicious for pneumonia. Bones are diffusely demineralized. Thoracolumbar fusion hardware evident with underlying scoliosis. IMPRESSION: Interval development of collapse/consolidation with probable small effusion at the right base, suspicious for pneumonia. Electronically Signed   By: Donnal Fusi M.D.   On: 02/13/2024 09:42    Assessment/Plan: 73 year old female with schizoaffective disorder presented gross hematuria clot retention.   Gross hematuria: - CBI on slow gtt on rounds. Clear irrigant. Clamped at around noon. Reassess tomorrow am.  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  Will VT tomorrow.    Tachycardia: -Concern for worsening heart failure history of arrhythmia - No evidence of bladder blood loss.   Urinary retention: - hydro has improved on CT 6/18 - Continue catheter.   Urinary tract infection: -Urine culture positive for Klebsiella - Continue ceftriaxone      LOS: 3 days   Aimee Houseman MD 02/13/2024, 5:45 PM [facility]

## 2024-02-14 DIAGNOSIS — N320 Bladder-neck obstruction: Secondary | ICD-10-CM | POA: Diagnosis not present

## 2024-02-14 LAB — CBC WITH DIFFERENTIAL/PLATELET
Abs Immature Granulocytes: 0.42 10*3/uL — ABNORMAL HIGH (ref 0.00–0.07)
Abs Immature Granulocytes: 0.46 10*3/uL — ABNORMAL HIGH (ref 0.00–0.07)
Basophils Absolute: 0.1 10*3/uL (ref 0.0–0.1)
Basophils Absolute: 0.1 10*3/uL (ref 0.0–0.1)
Basophils Relative: 0 %
Basophils Relative: 0 %
Eosinophils Absolute: 0.3 10*3/uL (ref 0.0–0.5)
Eosinophils Absolute: 0.3 10*3/uL (ref 0.0–0.5)
Eosinophils Relative: 1 %
Eosinophils Relative: 1 %
HCT: 22.3 % — ABNORMAL LOW (ref 36.0–46.0)
HCT: 28.1 % — ABNORMAL LOW (ref 36.0–46.0)
Hemoglobin: 6.8 g/dL — CL (ref 12.0–15.0)
Hemoglobin: 8.8 g/dL — ABNORMAL LOW (ref 12.0–15.0)
Immature Granulocytes: 2 %
Immature Granulocytes: 2 %
Lymphocytes Relative: 10 %
Lymphocytes Relative: 11 %
Lymphs Abs: 1.9 10*3/uL (ref 0.7–4.0)
Lymphs Abs: 2.2 10*3/uL (ref 0.7–4.0)
MCH: 28.8 pg (ref 26.0–34.0)
MCH: 29.1 pg (ref 26.0–34.0)
MCHC: 30.5 g/dL (ref 30.0–36.0)
MCHC: 31.3 g/dL (ref 30.0–36.0)
MCV: 94.5 fL (ref 80.0–100.0)
MCV: 93 fL (ref 80.0–100.0)
Monocytes Absolute: 1.5 10*3/uL — ABNORMAL HIGH (ref 0.1–1.0)
Monocytes Absolute: 1.5 10*3/uL — ABNORMAL HIGH (ref 0.1–1.0)
Monocytes Relative: 8 %
Monocytes Relative: 7 %
Neutro Abs: 14.9 10*3/uL — ABNORMAL HIGH (ref 1.7–7.7)
Neutro Abs: 15.7 10*3/uL — ABNORMAL HIGH (ref 1.7–7.7)
Neutrophils Relative %: 79 %
Neutrophils Relative %: 79 %
Platelets: 364 10*3/uL (ref 150–400)
Platelets: 353 10*3/uL (ref 150–400)
RBC: 2.36 MIL/uL — ABNORMAL LOW (ref 3.87–5.11)
RBC: 3.02 MIL/uL — ABNORMAL LOW (ref 3.87–5.11)
RDW: 15.9 % — ABNORMAL HIGH (ref 11.5–15.5)
RDW: 15.2 % (ref 11.5–15.5)
WBC: 19.1 10*3/uL — ABNORMAL HIGH (ref 4.0–10.5)
WBC: 20.2 10*3/uL — ABNORMAL HIGH (ref 4.0–10.5)
nRBC: 0 % (ref 0.0–0.2)
nRBC: 0 % (ref 0.0–0.2)

## 2024-02-14 LAB — COMPREHENSIVE METABOLIC PANEL WITH GFR
ALT: 191 U/L — ABNORMAL HIGH (ref 0–44)
AST: 242 U/L — ABNORMAL HIGH (ref 15–41)
Albumin: 1.9 g/dL — ABNORMAL LOW (ref 3.5–5.0)
Alkaline Phosphatase: 171 U/L — ABNORMAL HIGH (ref 38–126)
Anion gap: 7 (ref 5–15)
BUN: 22 mg/dL (ref 8–23)
CO2: 20 mmol/L — ABNORMAL LOW (ref 22–32)
Calcium: 9.3 mg/dL (ref 8.9–10.3)
Chloride: 110 mmol/L (ref 98–111)
Creatinine, Ser: 1.48 mg/dL — ABNORMAL HIGH (ref 0.44–1.00)
GFR, Estimated: 37 mL/min — ABNORMAL LOW (ref >60)
Glucose, Bld: 87 mg/dL (ref 70–99)
Potassium: 4 mmol/L (ref 3.5–5.1)
Sodium: 137 mmol/L (ref 135–145)
Total Bilirubin: 0.7 mg/dL (ref 0.0–1.2)
Total Protein: 5.9 g/dL — ABNORMAL LOW (ref 6.5–8.1)

## 2024-02-14 LAB — PREPARE RBC (CROSSMATCH)

## 2024-02-14 MED ORDER — SODIUM CHLORIDE 0.9% IV SOLUTION: Freq: Once | INTRAVENOUS | Status: AC

## 2024-02-14 NOTE — Progress Notes (Signed)
 PT Cancellation Note  Patient Details Name: Heidi Blevins MRN: [id number] DOB: 1951/03/06   Cancelled Treatment:     HgB 6.8  Will hold off Therapy for today and check back another.  Pt has been evaluated with rec for [facility] PT.  Bess Broody  PTA Acute  Rehabilitation Services Office M-F          [phone]

## 2024-02-14 NOTE — Plan of Care (Signed)

## 2024-02-14 NOTE — Progress Notes (Signed)
 PROGRESS NOTE    Heidi Blevins  HYQ:[id number] DOB: 01/15/1951 DOA: 02/09/2024 PCP: Pcp, No   Brief Narrative:  The patient is a 73 year old African-American female with past medical history significant for Mannam to essential hypertension, multiple UTIs, history of CVA, history of schizoaffective disorder, depression and anxiety, insomnia, CKD stage IIIa, chronic diastolic CHF, history of left renal mass with anemia and other comorbidities who presented to the ED for evaluation of lower abdominal pain, vaginal itching and pain as well as hematuria.  She started having abdominal pain last night and had some dysuria, hematuria and vaginal itching and had some associated nausea, fevers and chills.  Further workup was done and UA was too bloody to report adequately but showed greater than 50 WBCs.  CT of the abdomen pelvis was done and showed distended bladder with hemorrhage and mild bilateral hydroureteronephrosis.  Received IM Cogentin  1 mg, fentanyl , Zofran , Rocephin  and urology was consulted and she was placed on IV fluid hydration.   Since last night her blood count has dropped and she has been to be typed and screened and transfused 2 units of PRBCs.  After the 2 units of blood count is improved and is stable at 8.4.  She continues to have a metabolic acidosis and WBC remains elevated. Changed Abx to IV Meropenem  Assessment & Plan:   Principal Problem:   Bladder obstruction Active Problems:   Hydroureteronephrosis   Anemia   Bladder hemorrhage   ABLA (acute blood loss anemia)  Bladder obstruction and Bilateral Hydroureteronephrosis  - Patient with recently diagnosed left renal mass concerning for RCC presented with 1 day of hematuria and lower abdominal pain. Found to have abdominal distention, lower abdominal pain and gross hematuria on admission - CT A/P shows distended bladder with hemorrhage as well as mild bilateral hydroureteronephrosis extending to the UVJ - Urology  consulted, recommended three-way Foley catheter and bladder irrigation, this was completed by EDP; Urology recommends continuing catheter drainage at this time- Continue Foley catheter drainage.   Acute Cystitis versus Pyelonephritis urine culture growing ESBL Klebsiella so antibiotics switched from Rocephin  to meropenem.   Left Renal Mass: Patient found to have a 2.6 x 3.0 cm left renal mass on imaging earlier this year. Followed by oncology and urology.  Urology following, appreciate recs and she has follow-up imaging planned next month;    Acute Blood Loss Anemia superimposed on Chronic Anemia secondary to gross hematuria - Followed by oncology in outpatient setting and underwent Bone Marrow Bx where pathology did not indicate underlying bone marrow disorder; Has undergone extensive workup for Anemia and it was felt her Anemia was an Inflammatory Process - Hgb of 8.5 on admission, dropped to 6.3 on 02/10/2024 for which she received 2 units of.  Recent transfusion, hemoglobin over 8 posttransfusion but dropped to 7.4.  Urology is now suspecting bleeding from the kidney, CT renal with contrast ordered by urology 02/13/2024 did not show any source of extravasation/bleeding.  Patient's hemoglobin once again dropped to 6.8 early morning and 1 unit of PRBC transfusion is ordered.  I have ordered FOBT.  Although GI bleed has been ruled out in the past and there is no history of hematemesis or hematochezia or melena this hospitalization as well.  AKI on CKD Stage 3a/ Metabolic Acidosis: Baseline creatinine between 0.9-1.05.  Creatinine peaked at 1.4.  She received IV fluids.  Creatinine now down to 1.06 which is her baseline.   Essential HTN: BP initially elevated on admission, currently blood pressure  is normal despite of holding antihypertensives so we will continue to hold for now.    Anxiety and depression Schizoaffective disorder - Lithium  level subtherapeutic at 0.14 - Continue lithium , benztropine ,  lurasidone , fluphenazine  - Continue sertraline  and lorazepam    Abnormal LFTs/ Elevated LFTs: Elevated on Admission and had normalize but now Worsened again.  Discontinued atorvastatin  02/13/2024.  Avoid hepatotoxic agents.  Repeating CMP today.   Hx of CVA  HLD Plavix  on hold in the setting of active bleeding.  Hold statin as mentioned above.  DVT prophylaxis: SCDs Start: 02/10/24 0225   Code Status: Limited: Do not attempt resuscitation (DNR) -DNR-LIMITED -Do Not Intubate/DNI   Family Communication:  None present at bedside.   Status is: Inpatient Remains inpatient appropriate because: Actively bleeding and requiring blood transfusion.   Estimated body mass index is 21.44 kg/m as calculated from the following:   Height as of this encounter: 5' (1.524 m).   Weight as of this encounter: 49.8 kg.    Nutritional Assessment: Body mass index is 21.44 kg/m.Aaron Aas Seen by dietician.  I agree with the assessment and plan as outlined below: Nutrition Status:        . Skin Assessment: I have examined the patient's skin and I agree with the wound assessment as performed by the wound care RN as outlined below:    Consultants:  Urology  Procedures:  None  Antimicrobials:  Anti-infectives (From admission, onward)    Start     Dose/Rate Route Frequency Ordered Stop   02/12/24 1400  meropenem (MERREM) 1 g in sodium chloride  0.9 % 100 mL IVPB        1 g 200 mL/hr over 30 Minutes Intravenous Every 12 hours 02/12/24 1254     02/10/24 2300  cefTRIAXone  (ROCEPHIN ) 1 g in sodium chloride  0.9 % 100 mL IVPB  Status:  Discontinued        1 g 200 mL/hr over 30 Minutes Intravenous Every 24 hours 02/10/24 0247 02/12/24 1254   02/09/24 2315  cefTRIAXone  (ROCEPHIN ) 2 g in sodium chloride  0.9 % 100 mL IVPB        2 g 200 mL/hr over 30 Minutes Intravenous  Once 02/09/24 2306 02/09/24 2358         Subjective: Patient seen and examined.  She had no complaints at all.  She was asking when she  will be going home.  She was fully alert and oriented.  Objective: Vitals:   02/13/24 0428 02/13/24 1221 02/13/24 1925 02/14/24 0505  BP: 130/83 94/60 136/84 137/87  Pulse: (!) 110 89 87 82  Resp: 19 18 17 18   Temp: 99.2 F (37.3 C) 97.6 F (36.4 C) 97.8 F (36.6 C) 98 F (36.7 C)  TempSrc: Oral     SpO2: 97% 98% 100% 99%  Weight:      Height:        Intake/Output Summary (Last 24 hours) at 02/14/2024 1042 Last data filed at 02/14/2024 0900 Gross per 24 hour  Intake 680 ml  Output 4770 ml  Net -4090 ml   Filed Weights   02/09/24 1946 02/10/24 0356  Weight: 49.9 kg 49.8 kg    Examination:  General exam: Appears calm and comfortable  Respiratory system: Clear to auscultation. Respiratory effort normal. Cardiovascular system: S1 & S2 heard, RRR. No JVD, murmurs, rubs, gallops or clicks. No pedal edema. Gastrointestinal system: Abdomen is nondistended, soft and nontender. No organomegaly or masses felt. Normal bowel sounds heard. Central nervous system: Alert and oriented x  3. No focal neurological deficits. Extremities: Symmetric 5 x 5 power. Skin: No rashes, lesions or ulcers.   Data Reviewed: I have personally reviewed following labs and imaging studies  CBC: Recent Labs  Lab 02/11/24 0958 02/12/24 1033 02/13/24 0420 02/13/24 1649 02/14/24 0423  WBC 34.0* 37.0* 27.5* 25.7* 19.1*  NEUTROABS 30.5* 33.1* 24.2* 20.5* 14.9*  HGB 8.6* 8.4* 7.4* 7.0* 6.8*  HCT 25.8* 26.3* 23.3* 21.5* 22.3*  MCV 89.6 91.6 93.2 88.5 94.5  PLT 379 398 333 270 364   Basic Metabolic Panel: Recent Labs  Lab 02/09/24 2029 02/10/24 0956 02/11/24 0958 02/12/24 1033 02/13/24 0420  NA 131* 131* 137 136 139  K 4.0 4.7 4.1 3.5 3.7  CL 104 107 112* 110 115*  CO2 17* 16* 16* 18* 16*  GLUCOSE 198* 144* 117* 119* 99  BUN 23 23 26* 21 18  CREATININE 1.09* 1.39* 1.27* 1.08* 1.06*  CALCIUM  9.5 9.0 9.3 9.8 9.5  MG  --  1.8 2.0 2.1 2.0  PHOS  --  3.5 3.4 2.6 2.3*   GFR: Estimated  Creatinine Clearance: 34.5 mL/min (A) (by C-G formula based on SCr of 1.06 mg/dL (H)). Liver Function Tests: Recent Labs  Lab 02/09/24 2029 02/10/24 0956 02/11/24 0958 02/12/24 1033 02/13/24 0420  AST 45* 28 41 148* 191*  ALT 48* 33 35 84* 131*  ALKPHOS 230* 140* 149* 178* 157*  BILITOT 0.2 0.2 0.5 0.6 0.7  PROT 7.7 5.9* 5.8* 6.6 5.8*  ALBUMIN 2.8* 2.2* 2.1* 2.2* 2.0*   No results for input(s): LIPASE, AMYLASE in the last 168 hours. No results for input(s): AMMONIA in the last 168 hours. Coagulation Profile: No results for input(s): INR, PROTIME in the last 168 hours. Cardiac Enzymes: No results for input(s): CKTOTAL, CKMB, CKMBINDEX, TROPONINI in the last 168 hours. BNP (last 3 results) No results for input(s): PROBNP in the last 8760 hours. HbA1C: No results for input(s): HGBA1C in the last 72 hours. CBG: No results for input(s): GLUCAP in the last 168 hours. Lipid Profile: No results for input(s): CHOL, HDL, LDLCALC, TRIG, CHOLHDL, LDLDIRECT in the last 72 hours. Thyroid Function Tests: No results for input(s): TSH, T4TOTAL, FREET4, T3FREE, THYROIDAB in the last 72 hours. Anemia Panel: No results for input(s): VITAMINB12, FOLATE, FERRITIN, TIBC, IRON, RETICCTPCT in the last 72 hours. Sepsis Labs: No results for input(s): PROCALCITON, LATICACIDVEN in the last 168 hours.  Recent Results (from the past 240 hours)  Urine Culture     Status: Abnormal   Collection Time: 02/09/24 12:21 AM   Specimen: In/Out Cath Urine  Result Value Ref Range Status   Specimen Description   Final    IN/OUT CATH URINE Performed at [facility], [address] [place], [place] [address]    Special Requests   Final    NONE Performed at [facility], [address] [place], [place] [address]    Culture (A)  Final    60,000 COLONIES/mL KLEBSIELLA PNEUMONIAE Confirmed Extended Spectrum  Beta-Lactamase Producer (ESBL).  In bloodstream infections from ESBL organisms, carbapenems are preferred over piperacillin/tazobactam. They are shown to have a lower risk of mortality. Two isolates with different morphologies were identified as the same organism.The most resistant organism was reported. Performed at [facility], [address] [place], [place] [address]    Report Status 02/12/2024 FINAL  Final   Organism ID, Bacteria KLEBSIELLA PNEUMONIAE (A)  Final      Susceptibility   Klebsiella pneumoniae -  MIC*    AMPICILLIN >=32 RESISTANT Resistant     CEFAZOLIN  >=64 RESISTANT Resistant     CEFEPIME  >=32 RESISTANT Resistant     CEFTRIAXONE  >=64 RESISTANT Resistant     CIPROFLOXACIN <=0.25 SENSITIVE Sensitive     GENTAMICIN >=16 RESISTANT Resistant     IMIPENEM <=0.25 SENSITIVE Sensitive     NITROFURANTOIN 64 INTERMEDIATE Intermediate     TRIMETH/SULFA <=20 SENSITIVE Sensitive     AMPICILLIN/SULBACTAM 16 INTERMEDIATE Intermediate     PIP/TAZO <=4 SENSITIVE Sensitive ug/mL    * 60,000 COLONIES/mL KLEBSIELLA PNEUMONIAE  Urine Culture     Status: Abnormal   Collection Time: 02/09/24 12:21 AM   Specimen: Urine, Random  Result Value Ref Range Status   Specimen Description   Final    URINE, RANDOM Performed at [facility], [address], [place], [place] [address]    Special Requests   Final    NONE Reflexed from [id number] Performed at [facility], [address], [place], [place] [address]    Culture (A)  Final    40,000 COLONIES/mL KLEBSIELLA PNEUMONIAE Confirmed Extended Spectrum Beta-Lactamase Producer (ESBL).  In bloodstream infections from ESBL organisms, carbapenems are preferred over piperacillin/tazobactam. They are shown to have a lower risk of mortality.    Report Status 02/12/2024 FINAL  Final   Organism ID, Bacteria KLEBSIELLA PNEUMONIAE (A)  Final      Susceptibility   Klebsiella pneumoniae - MIC*    AMPICILLIN >=32  RESISTANT Resistant     CEFAZOLIN  >=64 RESISTANT Resistant     CEFEPIME  >=32 RESISTANT Resistant     CEFTRIAXONE  >=64 RESISTANT Resistant     CIPROFLOXACIN 0.5 INTERMEDIATE Intermediate     GENTAMICIN >=16 RESISTANT Resistant     IMIPENEM <=0.25 SENSITIVE Sensitive     NITROFURANTOIN 64 INTERMEDIATE Intermediate     TRIMETH/SULFA <=20 SENSITIVE Sensitive     AMPICILLIN/SULBACTAM >=32 RESISTANT Resistant     PIP/TAZO <=4 SENSITIVE Sensitive ug/mL    * 40,000 COLONIES/mL KLEBSIELLA PNEUMONIAE  Culture, blood (Routine X 2) w Reflex to ID Panel     Status: None (Preliminary result)   Collection Time: 02/12/24  8:48 PM   Specimen: BLOOD  Result Value Ref Range Status   Specimen Description   Final    BLOOD BLOOD RIGHT ARM Performed at [facility], [address], [place], [place] [address]    Special Requests   Final    BOTTLES DRAWN AEROBIC ONLY Blood Culture results may not be optimal due to an inadequate volume of blood received in culture bottles Performed at [facility], [address] [place], [place] [address]    Culture   Final    NO GROWTH 2 DAYS Performed at [facility], [address], [place], [place] [address]    Report Status PENDING  Incomplete  Culture, blood (Routine X 2) w Reflex to ID Panel     Status: None (Preliminary result)   Collection Time: 02/12/24  9:05 PM   Specimen: BLOOD LEFT HAND  Result Value Ref Range Status   Specimen Description   Final    BLOOD LEFT HAND Performed at [facility], [address] [place], [place] [address]    Special Requests   Final    BOTTLES DRAWN AEROBIC ONLY Blood Culture results may not be optimal due to an inadequate volume of blood received in culture bottles Performed at [facility], [address]  [place], [place] [address]    Culture   Final    NO GROWTH 2 DAYS Performed at [facility], [address], [place], [place] [address]     Report Status PENDING  Incomplete     Radiology Studies: CT HEMATURIA WORKUP Result Date: 02/13/2024 CLINICAL DATA:  Lower abdominal pain and hematuria. EXAM: CT ABDOMEN AND PELVIS WITHOUT AND WITH CONTRAST TECHNIQUE: Multidetector CT imaging of the abdomen and pelvis was performed following the standard protocol before and following the bolus administration of intravenous contrast. RADIATION DOSE REDUCTION: This exam was performed according to the departmental dose-optimization program which includes automated exposure control, adjustment of the mA and/or kV according to patient size and/or use of iterative reconstruction technique. CONTRAST:  OMNIPAQUE  IOHEXOL  300 MG/ML  SOLN COMPARISON:  02/09/2024 FINDINGS: Despite efforts by the technologist and patient, motion artifact is present on today's exam and could not be eliminated. This reduces exam sensitivity and specificity. Lower chest: Aortic and dense coronary atherosclerosis. Small pericardial effusion. Mild cardiomegaly. Small amount of fluid in the left major fissure. Linear subsegmental atelectasis in the right lower lobe. Hepatobiliary: Small calcification dependently in the gallbladder suspicious for cholelithiasis. Otherwise unremarkable. Pancreas: Unremarkable Spleen: Unremarkable Adrenals/Urinary Tract: Stable scarring in the right kidney. At least partially duplicated right renal collecting system. No current hydronephrosis. Foley catheter is present in the urinary bladder which is otherwise collapsed. Adrenal glands unremarkable. Stomach/Bowel: Prominent stool throughout the colon favors constipation. Mild prominence of scattered gas in the small bowel without overtly dilated small bowel. Assessment of bowel significantly adversely affected by motion artifact. Vascular/Lymphatic: Atherosclerosis is present, including aortoiliac atherosclerotic disease. Considerable atheromatous plaque at the origin of the celiac trunk. The celiac trunk  opacifies, accordingly is not felt to be totally occluded. Lesser atheromatous plaque at the origin of the SMA. Reproductive: Unremarkable Other: Trace free pelvic fluid. Musculoskeletal: Markedly severe dextroconvex thoracic and moderate levoconvex lumbar scoliosis with postoperative findings in the spine. Healing left rib fractures similar to previous. Stable vertical fractures of the sacral ala with transverse sclerotic healing fracture at the S2 vertebral level compatible with sacral insufficiency fractures. Left total hip prosthesis with cerclage wires. Stable appearance of fractures of the right pubic rami and right pubic body. Transverse sclerosis in the T10 vertebral body compatible with fracture, no change from previous. Substantial degenerative endplate findings at L4-5 and L5-S1 associated with spondylosis and foraminal narrowing at both levels. There is also right foraminal stenosis at L3-4. IMPRESSION: 1. Prominent stool throughout the colon favors constipation. 2. Foley catheter is present in the urinary bladder which is otherwise collapsed. No current hydronephrosis. Stable scarring in the right kidney. 3. Small pericardial effusion. Mild cardiomegaly. 4. Cholelithiasis. 5. Stable appearance of fractures of the sacral ala, S2 vertebral level, right pubic rami, and right pubic body. 6. Markedly severe dextroconvex thoracic and moderate levoconvex lumbar scoliosis with postoperative findings in the spine. 7. Substantial degenerative endplate findings at L4-5 and L5-S1 associated with spondylosis and foraminal narrowing at both levels. There is also right foraminal stenosis at L3-4. 8. Trace free pelvic fluid. 9. Aortic Atherosclerosis (ICD10-I70.0). Coronary and systemic atherosclerosis. Electronically Signed   By: Freida Jes M.D.   On: 02/13/2024 14:16   US  Abdomen Limited RUQ (LIVER/GB) Result Date: 02/13/2024 CLINICAL DATA:  Abnormal liver function tests. EXAM: ULTRASOUND ABDOMEN LIMITED  RIGHT UPPER QUADRANT COMPARISON:  CT abdomen pelvis 02/09/2024. FINDINGS: Gallbladder: No gallstones or wall thickening visualized. No sonographic Murphy sign noted by sonographer. Common bile  duct: Diameter: 3 mm, within normal limits. No intrahepatic biliary ductal dilatation. Liver: Liver margin is irregular. Small perihepatic ascites. Portal vein is patent on color Doppler imaging with normal direction of blood flow towards the liver. Other: None. IMPRESSION: 1. No acute findings. 2. Irregular liver contour suggests cirrhosis. Please correlate clinically. 3. Small perihepatic ascites. Electronically Signed   By: Shearon Denis M.D.   On: 02/13/2024 09:56   DG CHEST PORT 1 VIEW Result Date: 02/13/2024 CLINICAL DATA:  Leukocytosis. EXAM: PORTABLE CHEST 1 VIEW COMPARISON:  09/22/2023 FINDINGS: The cardio pericardial silhouette is enlarged. Left lung clear. Interval development of collapse/consolidation with probable small effusion at the right base, suspicious for pneumonia. Bones are diffusely demineralized. Thoracolumbar fusion hardware evident with underlying scoliosis. IMPRESSION: Interval development of collapse/consolidation with probable small effusion at the right base, suspicious for pneumonia. Electronically Signed   By: Donnal Fusi M.D.   On: 02/13/2024 09:42    Scheduled Meds:  benztropine   1 mg Oral BID   Chlorhexidine  Gluconate Cloth  6 each Topical Daily   fluPHENAZine   20 mg Oral BID   lithium  carbonate  150 mg Oral QHS   LORazepam   0.5 mg Oral QID   lurasidone   120 mg Oral Daily   sertraline   150 mg Oral Daily   sodium bicarbonate  650 mg Oral BID   Continuous Infusions:  meropenem (MERREM) IV 1 g (02/14/24 0947)   sodium chloride  irrigation 0 mL (02/10/24 0715)     LOS: 4 days   Modena Andes, MD [facility]  02/14/2024, 10:42 AM   *Please note that this is a verbal dictation therefore any spelling or grammatical errors are due to the Dragon Medical One system  interpretation.  Please page via Amion and do not message via secure chat for urgent patient care matters. Secure chat can be used for non urgent patient care matters.  How to contact the [facility] Attending or Consulting provider 7A - 7P or covering provider during after hours 7P -7A, for this patient?  Check the care team in [facility] and look for a) attending/consulting [facility] provider listed and b) the [facility] team listed. Page or secure chat 7A-7P. Log into [email or web address] and use [place]'s universal password to access. If you do not have the password, please contact the hospital operator. Locate the [facility] provider you are looking for under [facility] and page to a number that you can be directly reached. If you still have difficulty reaching the provider, please page the [facility] (Director on Call) for the Hospitalists listed on amion for assistance.

## 2024-02-14 NOTE — Progress Notes (Signed)
 Subjective: Doing well has urgency to urinate, catheter tubing clear yellow urine.   Objective: Vital signs in last 24 hours: Temp:  [97.6 F (36.4 C)-98 F (36.7 C)] 98 F (36.7 C) (06/19 0505) Pulse Rate:  [82-89] 82 (06/19 0505) Resp:  [17-18] 18 (06/19 0505) BP: (94-137)/(60-87) 137/87 (06/19 0505) SpO2:  [98 %-100 %] 99 % (06/19 0505)  Intake/Output from previous day: 06/18 0701 - 06/19 0700 In: 680 [P.O.:480; IV Piggyback:200] Out: 4550 [Urine:4550] Intake/Output this shift: No intake/output data recorded.  Physical Exam:  General: Alert and oriented CV: RRR Lungs: Clear GU: urine clear yellow  Lab Results: Recent Labs    02/13/24 0420 02/13/24 1649 02/14/24 0423  HGB 7.4* 7.0* 6.8*  HCT 23.3* 21.5* 22.3*   BMET Recent Labs    02/12/24 1033 02/13/24 0420  NA 136 139  K 3.5 3.7  CL 110 115*  CO2 18* 16*  GLUCOSE 119* 99  BUN 21 18  CREATININE 1.08* 1.06*  CALCIUM  9.8 9.5     Studies/Results: CT HEMATURIA WORKUP Result Date: 02/13/2024 CLINICAL DATA:  Lower abdominal pain and hematuria. EXAM: CT ABDOMEN AND PELVIS WITHOUT AND WITH CONTRAST TECHNIQUE: Multidetector CT imaging of the abdomen and pelvis was performed following the standard protocol before and following the bolus administration of intravenous contrast. RADIATION DOSE REDUCTION: This exam was performed according to the departmental dose-optimization program which includes automated exposure control, adjustment of the mA and/or kV according to patient size and/or use of iterative reconstruction technique. CONTRAST:  OMNIPAQUE  IOHEXOL  300 MG/ML  SOLN COMPARISON:  02/09/2024 FINDINGS: Despite efforts by the technologist and patient, motion artifact is present on today's exam and could not be eliminated. This reduces exam sensitivity and specificity. Lower chest: Aortic and dense coronary atherosclerosis. Small pericardial effusion. Mild cardiomegaly. Small amount of fluid in the left major  fissure. Linear subsegmental atelectasis in the right lower lobe. Hepatobiliary: Small calcification dependently in the gallbladder suspicious for cholelithiasis. Otherwise unremarkable. Pancreas: Unremarkable Spleen: Unremarkable Adrenals/Urinary Tract: Stable scarring in the right kidney. At least partially duplicated right renal collecting system. No current hydronephrosis. Foley catheter is present in the urinary bladder which is otherwise collapsed. Adrenal glands unremarkable. Stomach/Bowel: Prominent stool throughout the colon favors constipation. Mild prominence of scattered gas in the small bowel without overtly dilated small bowel. Assessment of bowel significantly adversely affected by motion artifact. Vascular/Lymphatic: Atherosclerosis is present, including aortoiliac atherosclerotic disease. Considerable atheromatous plaque at the origin of the celiac trunk. The celiac trunk opacifies, accordingly is not felt to be totally occluded. Lesser atheromatous plaque at the origin of the SMA. Reproductive: Unremarkable Other: Trace free pelvic fluid. Musculoskeletal: Markedly severe dextroconvex thoracic and moderate levoconvex lumbar scoliosis with postoperative findings in the spine. Healing left rib fractures similar to previous. Stable vertical fractures of the sacral ala with transverse sclerotic healing fracture at the S2 vertebral level compatible with sacral insufficiency fractures. Left total hip prosthesis with cerclage wires. Stable appearance of fractures of the right pubic rami and right pubic body. Transverse sclerosis in the T10 vertebral body compatible with fracture, no change from previous. Substantial degenerative endplate findings at L4-5 and L5-S1 associated with spondylosis and foraminal narrowing at both levels. There is also right foraminal stenosis at L3-4. IMPRESSION: 1. Prominent stool throughout the colon favors constipation. 2. Foley catheter is present in the urinary bladder which  is otherwise collapsed. No current hydronephrosis. Stable scarring in the right kidney. 3. Small pericardial effusion. Mild cardiomegaly. 4. Cholelithiasis. 5. Stable appearance  of fractures of the sacral ala, S2 vertebral level, right pubic rami, and right pubic body. 6. Markedly severe dextroconvex thoracic and moderate levoconvex lumbar scoliosis with postoperative findings in the spine. 7. Substantial degenerative endplate findings at L4-5 and L5-S1 associated with spondylosis and foraminal narrowing at both levels. There is also right foraminal stenosis at L3-4. 8. Trace free pelvic fluid. 9. Aortic Atherosclerosis (ICD10-I70.0). Coronary and systemic atherosclerosis. Electronically Signed   By: Freida Jes M.D.   On: 02/13/2024 14:16   US  Abdomen Limited RUQ (LIVER/GB) Result Date: 02/13/2024 CLINICAL DATA:  Abnormal liver function tests. EXAM: ULTRASOUND ABDOMEN LIMITED RIGHT UPPER QUADRANT COMPARISON:  CT abdomen pelvis 02/09/2024. FINDINGS: Gallbladder: No gallstones or wall thickening visualized. No sonographic Murphy sign noted by sonographer. Common bile duct: Diameter: 3 mm, within normal limits. No intrahepatic biliary ductal dilatation. Liver: Liver margin is irregular. Small perihepatic ascites. Portal vein is patent on color Doppler imaging with normal direction of blood flow towards the liver. Other: None. IMPRESSION: 1. No acute findings. 2. Irregular liver contour suggests cirrhosis. Please correlate clinically. 3. Small perihepatic ascites. Electronically Signed   By: Shearon Denis M.D.   On: 02/13/2024 09:56   DG CHEST PORT 1 VIEW Result Date: 02/13/2024 CLINICAL DATA:  Leukocytosis. EXAM: PORTABLE CHEST 1 VIEW COMPARISON:  09/22/2023 FINDINGS: The cardio pericardial silhouette is enlarged. Left lung clear. Interval development of collapse/consolidation with probable small effusion at the right base, suspicious for pneumonia. Bones are diffusely demineralized. Thoracolumbar  fusion hardware evident with underlying scoliosis. IMPRESSION: Interval development of collapse/consolidation with probable small effusion at the right base, suspicious for pneumonia. Electronically Signed   By: Donnal Fusi M.D.   On: 02/13/2024 09:42    Assessment/Plan: 73 year old female with schizoaffective disorder presented gross hematuria clot retention.   Gross hematuria: - CBI on slow gtt on rounds. Clear irrigant. Clamped at around noon. Reassess tomorrow am.  - Continue to hold Plavix  - due to repeat bleeding overnight repeated imaging no upper tract clots, difficult to visualize tumor in L kidney  - urine clear will VT today  - Hemoglobin decreased by 0.2 transfusion today, urine clear minimal H&H drop no concern for active bleed.    Tachycardia: -Concern for worsening heart failure history of arrhythmia - No evidence of bladder blood loss.   Urinary retention: - hydro has improved on CT 6/18 - Continue catheter.   Urinary tract infection: -Urine culture positive for Klebsiella - Continue ceftriaxone  - total antibiotic duration should be 14 days   LOS: 4 days   Heidi Houseman MD 02/14/2024, 7:43 AM [facility]

## 2024-02-15 DIAGNOSIS — Z1612 Extended spectrum beta lactamase (ESBL) resistance: Secondary | ICD-10-CM

## 2024-02-15 DIAGNOSIS — B961 Klebsiella pneumoniae [K. pneumoniae] as the cause of diseases classified elsewhere: Secondary | ICD-10-CM | POA: Diagnosis not present

## 2024-02-15 DIAGNOSIS — N320 Bladder-neck obstruction: Secondary | ICD-10-CM | POA: Diagnosis not present

## 2024-02-15 DIAGNOSIS — N390 Urinary tract infection, site not specified: Secondary | ICD-10-CM | POA: Diagnosis not present

## 2024-02-15 LAB — CBC WITH DIFFERENTIAL/PLATELET
Abs Immature Granulocytes: 0.48 10*3/uL — ABNORMAL HIGH (ref 0.00–0.07)
Basophils Absolute: 0.1 10*3/uL (ref 0.0–0.1)
Basophils Relative: 0 %
Eosinophils Absolute: 0.3 10*3/uL (ref 0.0–0.5)
Eosinophils Relative: 1 %
HCT: 30.6 % — ABNORMAL LOW (ref 36.0–46.0)
Hemoglobin: 9.5 g/dL — ABNORMAL LOW (ref 12.0–15.0)
Immature Granulocytes: 2 %
Lymphocytes Relative: 9 %
Lymphs Abs: 2.2 10*3/uL (ref 0.7–4.0)
MCH: 29.4 pg (ref 26.0–34.0)
MCHC: 31 g/dL (ref 30.0–36.0)
MCV: 94.7 fL (ref 80.0–100.0)
Monocytes Absolute: 1.3 10*3/uL — ABNORMAL HIGH (ref 0.1–1.0)
Monocytes Relative: 6 %
Neutro Abs: 18.8 10*3/uL — ABNORMAL HIGH (ref 1.7–7.7)
Neutrophils Relative %: 82 %
Platelets: 419 10*3/uL — ABNORMAL HIGH (ref 150–400)
RBC: 3.23 MIL/uL — ABNORMAL LOW (ref 3.87–5.11)
RDW: 15.8 % — ABNORMAL HIGH (ref 11.5–15.5)
WBC: 23.2 10*3/uL — ABNORMAL HIGH (ref 4.0–10.5)
nRBC: 0 % (ref 0.0–0.2)

## 2024-02-15 LAB — COMPREHENSIVE METABOLIC PANEL WITH GFR
ALT: 187 U/L — ABNORMAL HIGH (ref 0–44)
AST: 179 U/L — ABNORMAL HIGH (ref 15–41)
Albumin: 2.2 g/dL — ABNORMAL LOW (ref 3.5–5.0)
Alkaline Phosphatase: 178 U/L — ABNORMAL HIGH (ref 38–126)
Anion gap: 10 (ref 5–15)
BUN: 21 mg/dL (ref 8–23)
CO2: 19 mmol/L — ABNORMAL LOW (ref 22–32)
Calcium: 9.6 mg/dL (ref 8.9–10.3)
Chloride: 109 mmol/L (ref 98–111)
Creatinine, Ser: 1.31 mg/dL — ABNORMAL HIGH (ref 0.44–1.00)
GFR, Estimated: 43 mL/min — ABNORMAL LOW (ref >60)
Glucose, Bld: 79 mg/dL (ref 70–99)
Potassium: 4.2 mmol/L (ref 3.5–5.1)
Sodium: 138 mmol/L (ref 135–145)
Total Bilirubin: 0.8 mg/dL (ref 0.0–1.2)
Total Protein: 6.6 g/dL (ref 6.5–8.1)

## 2024-02-15 LAB — TYPE AND SCREEN
ABO/RH(D): O POS
Antibody Screen: POSITIVE
Donor AG Type: NEGATIVE
Unit division: 0

## 2024-02-15 LAB — CULTURE, BLOOD (ROUTINE X 2)

## 2024-02-15 LAB — BPAM RBC
Blood Product Expiration Date: 202507062359
ISSUE DATE / TIME: 202506191117
Unit Type and Rh: 5100

## 2024-02-15 MED ORDER — CLOPIDOGREL BISULFATE 75 MG PO TABS: 75.0000 mg | ORAL_TABLET | Freq: Every day | ORAL | Status: AC

## 2024-02-15 MED ORDER — ATORVASTATIN CALCIUM 40 MG PO TABS: 40.0000 mg | ORAL_TABLET | Freq: Every day | ORAL | Status: DC

## 2024-02-15 MED ORDER — LORAZEPAM 0.5 MG PO TABS: 0.5000 mg | ORAL_TABLET | Freq: Four times a day (QID) | ORAL | 0 refills | Status: AC

## 2024-02-15 MED ORDER — ERTAPENEM IV (FOR PTA / DISCHARGE USE ONLY): 500.0000 mg | INTRAVENOUS | 0 refills | Status: AC

## 2024-02-15 MED ORDER — TRAMADOL HCL 50 MG PO TABS
50.0000 mg | ORAL_TABLET | Freq: Four times a day (QID) | ORAL | Status: AC | PRN
  Administered 2024-02-15 – 2024-02-16 (×2): 50 mg via ORAL
  Filled 2024-02-15 (×2): qty 1

## 2024-02-15 NOTE — Progress Notes (Signed)
 Subjective: Abdomen soft urine clear   Objective: Vital signs in last 24 hours: Temp:  [98 F (36.7 C)-98.9 F (37.2 C)] 98.9 F (37.2 C) (06/20 0349) Pulse Rate:  [78-92] 81 (06/20 0349) Resp:  [15-19] 15 (06/20 0349) BP: (110-129)/(69-80) 129/76 (06/20 0349) SpO2:  [96 %-100 %] 96 % (06/20 0349)  Intake/Output from previous day: 06/19 0701 - 06/20 0700 In: 1440 [P.O.:800; Blood:440; IV Piggyback:200] Out: 1070 [Urine:1070] Intake/Output this shift: No intake/output data recorded.  Physical Exam:  General: Alert and oriented CV: RRR Lungs: Clear Abdomen: Soft, ND, non tender  GU: urine clear in collection bag   Lab Results: Recent Labs    02/14/24 0423 02/14/24 1624 02/15/24 0624  HGB 6.8* 8.8* 9.5*  HCT 22.3* 28.1* 30.6*   BMET Recent Labs    02/14/24 1624 02/15/24 0624  NA 137 138  K 4.0 4.2  CL 110 109  CO2 20* 19*  GLUCOSE 87 79  BUN 22 21  CREATININE 1.48* 1.31*  CALCIUM  9.3 9.6     Studies/Results: CT HEMATURIA WORKUP Result Date: 02/13/2024 CLINICAL DATA:  Lower abdominal pain and hematuria. EXAM: CT ABDOMEN AND PELVIS WITHOUT AND WITH CONTRAST TECHNIQUE: Multidetector CT imaging of the abdomen and pelvis was performed following the standard protocol before and following the bolus administration of intravenous contrast. RADIATION DOSE REDUCTION: This exam was performed according to the departmental dose-optimization program which includes automated exposure control, adjustment of the mA and/or kV according to patient size and/or use of iterative reconstruction technique. CONTRAST:  OMNIPAQUE  IOHEXOL  300 MG/ML  SOLN COMPARISON:  02/09/2024 FINDINGS: Despite efforts by the technologist and patient, motion artifact is present on today's exam and could not be eliminated. This reduces exam sensitivity and specificity. Lower chest: Aortic and dense coronary atherosclerosis. Small pericardial effusion. Mild cardiomegaly. Small amount of fluid in the  left major fissure. Linear subsegmental atelectasis in the right lower lobe. Hepatobiliary: Small calcification dependently in the gallbladder suspicious for cholelithiasis. Otherwise unremarkable. Pancreas: Unremarkable Spleen: Unremarkable Adrenals/Urinary Tract: Stable scarring in the right kidney. At least partially duplicated right renal collecting system. No current hydronephrosis. Foley catheter is present in the urinary bladder which is otherwise collapsed. Adrenal glands unremarkable. Stomach/Bowel: Prominent stool throughout the colon favors constipation. Mild prominence of scattered gas in the small bowel without overtly dilated small bowel. Assessment of bowel significantly adversely affected by motion artifact. Vascular/Lymphatic: Atherosclerosis is present, including aortoiliac atherosclerotic disease. Considerable atheromatous plaque at the origin of the celiac trunk. The celiac trunk opacifies, accordingly is not felt to be totally occluded. Lesser atheromatous plaque at the origin of the SMA. Reproductive: Unremarkable Other: Trace free pelvic fluid. Musculoskeletal: Markedly severe dextroconvex thoracic and moderate levoconvex lumbar scoliosis with postoperative findings in the spine. Healing left rib fractures similar to previous. Stable vertical fractures of the sacral ala with transverse sclerotic healing fracture at the S2 vertebral level compatible with sacral insufficiency fractures. Left total hip prosthesis with cerclage wires. Stable appearance of fractures of the right pubic rami and right pubic body. Transverse sclerosis in the T10 vertebral body compatible with fracture, no change from previous. Substantial degenerative endplate findings at L4-5 and L5-S1 associated with spondylosis and foraminal narrowing at both levels. There is also right foraminal stenosis at L3-4. IMPRESSION: 1. Prominent stool throughout the colon favors constipation. 2. Foley catheter is present in the urinary  bladder which is otherwise collapsed. No current hydronephrosis. Stable scarring in the right kidney. 3. Small pericardial effusion. Mild cardiomegaly. 4. Cholelithiasis.  5. Stable appearance of fractures of the sacral ala, S2 vertebral level, right pubic rami, and right pubic body. 6. Markedly severe dextroconvex thoracic and moderate levoconvex lumbar scoliosis with postoperative findings in the spine. 7. Substantial degenerative endplate findings at L4-5 and L5-S1 associated with spondylosis and foraminal narrowing at both levels. There is also right foraminal stenosis at L3-4. 8. Trace free pelvic fluid. 9. Aortic Atherosclerosis (ICD10-I70.0). Coronary and systemic atherosclerosis. Electronically Signed   By: Freida Jes M.D.   On: 02/13/2024 14:16    Assessment/Plan: 73 year old female with schizoaffective disorder presented gross hematuria clot retention.   Gross hematuria: Urine clear catheter out  Pt will be called to set up cystoscopy as outpatient   Tachycardia: - resolved    Urinary retention: - hydro has improved on CT 6/18 - Continue catheter.   Urinary tract infection: -Urine culture positive for Klebsiella - Continue ceftriaxone  - total antibiotic duration should be 14 days  Urology to follow peripherally.    LOS: 5 days   Heidi Houseman MD 02/15/2024, 8:02 AM [facility]

## 2024-02-15 NOTE — Progress Notes (Signed)

## 2024-02-15 NOTE — Progress Notes (Signed)
 Mobility Specialist - Progress Note   02/15/24 1156  Mobility  Activity Transferred from bed to chair  Level of Assistance Moderate assist, patient does 50-74%  Assistive Device Front wheel walker  Range of Motion/Exercises Active Assistive  Activity Response Tolerated fair  Mobility Referral Yes  Mobility visit 1 Mobility  Mobility Specialist Start Time (ACUTE ONLY) 1145  Mobility Specialist Stop Time (ACUTE ONLY) 1156  Mobility Specialist Time Calculation (min) (ACUTE ONLY) 11 min   Pt was found in bed and agreeable to transfer. Stated feeling very weak and repeatedly saying I can't do it during session. At EOS was left on recliner chair with all needs met. Call bell in reach.  Lorna Rose,  Mobility Specialist Can be reached via Secure Chat

## 2024-02-15 NOTE — NC FL2 (Signed)
 [place]  MEDICAID FL2 LEVEL OF CARE FORM     IDENTIFICATION  Patient Name: Heidi Blevins Birthdate: [birth year] Sex: female Admission Date (Current Location): 02/09/2024  [place] and [place] Number:  Ernesto Heady [id number] L Facility and Address:  [facility],  [address], [place] [address]      Provider Number: [id number]  Attending Physician Name and Address:  Modena Andes, MD  Relative Name and Phone Number:  Shenekia Riess (son) Ph: [phone]    Current Level of Care: Hospital Recommended Level of Care: Skilled Nursing Facility Prior Approval Number:    Date Approved/Denied:   PASRR Number: Pending  Discharge Plan: SNF    Current Diagnoses: Patient Active Problem List   Diagnosis Date Noted   Bladder obstruction 02/10/2024   Hydroureteronephrosis 02/10/2024   Anemia 02/10/2024   Bladder hemorrhage 02/10/2024   ABLA (acute blood loss anemia) 02/10/2024   CKD stage 3a, GFR 45-59 ml/min (HCC) 09/23/2023   Pericardial effusion 09/23/2023   Kidney lesion, native, left 09/23/2023   Acute cystitis 04/23/2022   Chronic diastolic CHF (congestive heart failure) (HCC) 04/23/2022   GAD (generalized anxiety disorder) 04/23/2022   HLD (hyperlipidemia) 04/23/2022   Polypharmacy 08/09/2020   Acute CVA (cerebrovascular accident) (HCC) 08/09/2020   Thyroid nodule 08/09/2020   History of CVA (cerebrovascular accident) 08/08/2020   Bradycardia    Acute metabolic encephalopathy    Sepsis secondary to UTI (HCC) 03/20/2019   Closed displaced fracture of left femoral neck (HCC) 09/26/2018   AKI (acute kidney injury) (HCC) 06/13/2016   UTI (urinary tract infection) 06/13/2016   Essential hypertension 06/13/2016   Urinary tract infection without hematuria    Complicated UTI (urinary tract infection) 05/03/2016   Hypercalcemia 05/03/2016   Dehydration 05/03/2016   Acute encephalopathy 05/03/2016   Cerebrovascular disease 05/03/2016   Schizoaffective  disorder (HCC) 05/03/2016   Bifascicular block 05/03/2016    Orientation RESPIRATION BLADDER Height & Weight     Self, Place  Normal Incontinent Weight: 109 lb 12.6 oz (49.8 kg) Height:  5' (152.4 cm)  BEHAVIORAL SYMPTOMS/MOOD NEUROLOGICAL BOWEL NUTRITION STATUS      Continent Diet (Regular diet)  AMBULATORY STATUS COMMUNICATION OF NEEDS Skin   Limited Assist Verbally Other (Comment) (Ecchymosis: bilateral arms; Erythema: buttocks)                       Personal Care Assistance Level of Assistance  Bathing, Feeding, Dressing Bathing Assistance: Limited assistance Feeding assistance: Limited assistance Dressing Assistance: Limited assistance     Functional Limitations Info  Sight, Hearing, Speech Sight Info: Adequate Hearing Info: Adequate Speech Info: Adequate    SPECIAL CARE FACTORS FREQUENCY                       Contractures Contractures Info: Not present    Additional Factors Info  Code Status, Allergies, Psychotropic Code Status Info: DNR Allergies Info: Sulfonamide Derivatives Psychotropic Info: See discharge summary         Current Medications (02/15/2024):  This is the current hospital active medication list Current Facility-Administered Medications  Medication Dose Route Frequency Provider Last Rate Last Admin   acetaminophen  (TYLENOL ) tablet 650 mg  650 mg Oral Q6H PRN Amponsah, Prosper M, MD   650 mg at 02/15/24 2841   Or   acetaminophen  (TYLENOL ) suppository 650 mg  650 mg Rectal Q6H PRN Vita Grip, MD       benztropine  (COGENTIN ) tablet 1 mg  1 mg Oral  BID Amponsah, Prosper M, MD   1 mg at 02/15/24 4098   Chlorhexidine  Gluconate Cloth 2 % PADS 6 each  6 each Topical Daily Modena Andes, MD   6 each at 02/14/24 0944   fluPHENAZine  (PROLIXIN ) tablet 20 mg  20 mg Oral BID Amponsah, Prosper M, MD   20 mg at 02/15/24 1191   HYDROmorphone  (DILAUDID ) injection 0.5 mg  0.5 mg Intravenous Q6H PRN Amponsah, Prosper M, MD   0.5 mg at 02/14/24  2211   lithium  carbonate capsule 150 mg  150 mg Oral QHS Amponsah, Prosper M, MD   150 mg at 02/14/24 2152   LORazepam  (ATIVAN ) tablet 0.5 mg  0.5 mg Oral QID Amponsah, Prosper M, MD   0.5 mg at 02/15/24 0857   lurasidone  (LATUDA ) tablet 120 mg  120 mg Oral Daily Amponsah, Prosper M, MD   120 mg at 02/15/24 0858   meropenem (MERREM) 1 g in sodium chloride  0.9 % 100 mL IVPB  1 g Intravenous Q12H Sheikh, Omair Latif, DO 200 mL/hr at 02/15/24 0908 1 g at 02/15/24 4782   ondansetron  (ZOFRAN ) tablet 4 mg  4 mg Oral Q6H PRN Amponsah, Prosper M, MD       Or   ondansetron  (ZOFRAN ) injection 4 mg  4 mg Intravenous Q6H PRN Amponsah, Prosper M, MD       senna-docusate (Senokot-S) tablet 1 tablet  1 tablet Oral QHS PRN Amponsah, Prosper M, MD   1 tablet at 02/14/24 9562   sertraline  (ZOLOFT ) tablet 150 mg  150 mg Oral Daily Vita Grip, MD   150 mg at 02/15/24 0857   sodium bicarbonate tablet 650 mg  650 mg Oral BID Sheikh, Omair Latif, DO   650 mg at 02/15/24 1308   sodium chloride  irrigation 0.9 % 3,000 mL  3,000 mL Irrigation Continuous Chavez, Abigail, NP 0 mL/hr at 02/10/24 0715 3,000 mL at 02/12/24 6578     Discharge Medications: Please see discharge summary for a list of discharge medications.  Relevant Imaging Results:  Relevant Lab Results:   Additional Information SSN: [id number]. Ertapenem 500 mg IV Q 24 hours.  End date: 02/25/24  Zenon Hilda, LCSW

## 2024-02-15 NOTE — Discharge Summary (Signed)
 Physician Discharge Summary  Heidi Blevins:[id number] DOB: 12/30/1950 DOA: 02/09/2024  PCP: Pcp, No  Admit date: 02/09/2024 Discharge date: 02/15/2024 30 Day Unplanned Readmission Risk Score    Flowsheet Row ED to Hosp-Admission (Current) from 02/09/2024 in [facility]TH FLOOR [facility] UROLOGY  30 Day Unplanned Readmission Risk Score (%) 18.41 Filed at 02/15/2024 1200    This score is the patient's risk of an unplanned readmission within 30 days of being discharged (0 -100%). The score is based on dignosis, age, lab data, medications, orders, and past utilization.   Low:  0-14.9   Medium: 15-21.9   High: 22-29.9   Extreme: 30 and above          Admitted From: [facility] Disposition: [facility]  Recommendations for Outpatient Follow-up:  Follow up with PCP in 1-2 weeks Please obtain BMP/CBC in one week Follow-up with [facility]-they will call you to schedule outpatient cystoscopy Please follow up with your PCP on the following pending results: Unresulted Labs (From admission, onward)     Start     Ordered   02/14/24 1055  Comprehensive metabolic panel  Daily,   R     Question:  Specimen collection method  Answer:  Lab=Lab collect   02/14/24 1054   02/10/24 0341  MRSA Next Gen by PCR, Nasal  Once,   R        02/10/24 0340   Unscheduled  Occult blood card to lab, stool  As needed,   R      02/14/24 0821              Home Health: Yes Equipment/Devices: None  Discharge Condition: Stable CODE STATUS: DNR Diet recommendation: Cardiac  Subjective: Seen and examined, she has no complaints at all.  Brief/Interim Summary: The patient is a 73 year old African-American female with past medical history significant for essential hypertension, multiple UTIs, history of CVA, history of schizoaffective disorder, depression and anxiety, insomnia, CKD stage IIIa, chronic diastolic CHF, history of left renal mass with anemia and other comorbidities  who presented to the ED for evaluation of lower abdominal pain, vaginal itching and pain as well as hematuria, abdominal pain and dysuria.  Further workup was done and UA was too bloody to report adequately but showed greater than 50 WBCs.  CT of the abdomen pelvis was done and showed distended bladder with hemorrhage and mild bilateral hydroureteronephrosis.  Received IM Cogentin  1 mg, fentanyl , Zofran , Rocephin  and urology was consulted and she was placed on IV fluid hydration.  Patient was further hospitalized, details of the hospitalization as below.  Bladder obstruction and Bilateral Hydroureteronephrosis  - Patient with recently diagnosed left renal mass concerning for RCC presented with 1 day of hematuria and lower abdominal pain. Found to have abdominal distention, lower abdominal pain and gross hematuria on admission - CT A/P shows distended bladder with hemorrhage as well as mild bilateral hydroureteronephrosis extending to the UVJ - Urology consulted, recommended three-way Foley catheter and bladder irrigation, this was completed by EDP; patient's hematuria resolved, eventually Foley catheter was removed yesterday.  Patient's urine is clear.  Urology has cleared the patient for discharge and they are going to call her to schedule outpatient cystoscopy.   Acute Cystitis versus Pyelonephritis urine culture growing ESBL Klebsiella so antibiotics switched from Rocephin  to meropenem.  ID consulted.  They have recommended continuing ertapenem until 02/25/2024.  Patient will get PICC line placed before discharge today.   Left Renal Mass: Patient found to  have a 2.6 x 3.0 cm left renal mass on imaging earlier this year. Followed by oncology and urology.  Urology followed, cystoscopy as outpatient, they will call and schedule.   Acute Blood Loss Anemia superimposed on Chronic Anemia secondary to gross hematuria - Followed by oncology in outpatient setting and underwent Bone Marrow Bx where pathology  did not indicate underlying bone marrow disorder; Has undergone extensive workup for Anemia and it was felt her Anemia was an Inflammatory Process - Hgb of 8.5 on admission, dropped to 6.3 on 02/10/2024 for which she received 2 units of.  Recent transfusion, hemoglobin over 8 posttransfusion but dropped to 7.4.  Urology is now suspecting bleeding from the kidney, CT renal with contrast ordered by urology 02/13/2024 did not show any source of extravasation/bleeding.  Patient's hemoglobin once again dropped to 6.8 early morning of 02/14/2024 and 1 unit of PRBC transfusion is ordered.  GI bleed has been ruled out in the past.  Patient's hemoglobin has significantly improved, 8.8 after transfusion yesterday and 9.5 today.   AKI on CKD Stage 3a/ Metabolic Acidosis: Baseline creatinine between 0.9-1.05.  Creatinine peaked at 1.4.  She received IV fluids.  Creatinine now down to 1.31 which is very close to her baseline.   Essential HTN: BP initially elevated on admission, currently blood pressure is normal despite of holding antihypertensives so we decided to discontinue her lisinopril  but continue metoprolol  at discharge.   Anxiety and depression Schizoaffective disorder - Lithium  level subtherapeutic at 0.14 - Continue lithium , benztropine , lurasidone , fluphenazine  - Continue sertraline  and lorazepam    Abnormal LFTs/ Elevated LFTs: Elevated on Admission and had normalize but then worsened again and then atorvastatin  was discontinued, however LFTs have improved.  I recommend holding atorvastatin  for another week and then resume if LFTs continue to improve.   Hx of CVA  HLD Plavix  on hold in the setting of active bleeding.  However hemoglobin is stable however I would recommend to hold Plavix  for 4 more days and then resume.  Hold statin as mentioned above.  Discharge plan was discussed with patient and/or family member and they verbalized understanding and agreed with it.  Discharge Diagnoses:  Principal  Problem:   Bladder obstruction Active Problems:   Hydroureteronephrosis   Anemia   Bladder hemorrhage   ABLA (acute blood loss anemia)    Discharge Instructions  Discharge Instructions     [facility] pharmacist to adjust dose for Vancomycin , Aminoglycosides and other anti-infective therapies as requested by physician.   Complete by: As directed    [facility] to provide Cath Flo 2mg    Complete by: As directed    Administer for PICC line occlusion and as ordered by physician for other access device issues.   Anaphylaxis Kit: Provided to treat any anaphylactic reaction to the medication being provided to the patient if First Dose or when requested by physician   Complete by: As directed    Epinephrine 1mg /ml vial / amp: Administer 0.3mg  (0.43ml) subcutaneously once for moderate to severe anaphylaxis, nurse to call physician and pharmacy when reaction occurs and call 911 if needed for immediate care   Diphenhydramine  50mg /ml IV vial: Administer 25-50mg  IV/IM PRN for first dose reaction, rash, itching, mild reaction, nurse to call physician and pharmacy when reaction occurs   Sodium Chloride  0.9% NS 500ml IV: Administer if needed for hypovolemic blood pressure drop or as ordered by physician after call to physician with anaphylactic reaction   Change dressing on IV access line weekly  and PRN   Complete by: As directed    Flush IV access with Sodium Chloride  0.9% and Heparin 10 units/ml or 100 units/ml   Complete by: As directed    Home infusion instructions - [facility]   Complete by: As directed    Instructions: Flush IV access with Sodium Chloride  0.9% and Heparin 10units/ml or 100units/ml   Change dressing on IV access line: Weekly and PRN   Instructions Cath Flo 2mg : Administer for PICC Line occlusion and as ordered by physician for other access device   [facility] pharmacist to adjust dose for: Vancomycin , Aminoglycosides and other  anti-infective therapies as requested by physician   Method of administration may be changed at the discretion of home infusion pharmacist based upon assessment of the patient and/or caregiver's ability to self-administer the medication ordered   Complete by: As directed       Allergies as of 02/15/2024       Reactions   Sulfonamide Derivatives Hives            Medication List     STOP taking these medications    hydrocortisone 2.5 % ointment   lisinopril  20 MG tablet Commonly known as: ZESTRIL        TAKE these medications    acetaminophen  500 MG tablet Commonly known as: TYLENOL  Take 1,000 mg by mouth in the morning and at bedtime.   atorvastatin  40 MG tablet Commonly known as: LIPITOR Take 1 tablet (40 mg total) by mouth daily. Start taking on: February 22, 2024 What changed: These instructions start on February 22, 2024. If you are unsure what to do until then, ask your doctor or other care provider.   Bengay Vanishing Scent 2.5 % Gel Generic drug: Menthol  (Topical Analgesic) Apply topically 3 (three) times daily as needed (BACK PAIN). APPLY TO LOWER BACK THREE TIMES DAILY AS NEEDED FOR BACK PAIN   benztropine  1 MG tablet Commonly known as: COGENTIN  Take 1 mg by mouth 2 (two) times daily.   clopidogrel  75 MG tablet Commonly known as: PLAVIX  Take 1 tablet (75 mg total) by mouth daily.   Cranberry 425 MG Caps Take 425 mg by mouth 2 (two) times a day.   ertapenem IVPB Commonly known as: INVANZ Inject 500 mg into the vein daily for 10 days. Indication:  ESBL Klebsiella UTI  First Dose: Yes Last Day of Therapy:  02/25/24  Labs - Once weekly:  CBC/D and BMP, Labs - Once weekly: ESR and CRP Method of administration: Mini-Bag Plus / Gravity Method of administration may be changed at the discretion of home infusion pharmacist based upon assessment of the patient and/or caregiver's ability to self-administer the medication ordered.   estradiol 0.1 MG/GM vaginal  cream Commonly known as: ESTRACE Place 1 Applicatorful vaginally. TWICE PER WEEK   FeroSul 325 (65 FE) MG tablet Generic drug: ferrous sulfate Take 325 mg by mouth daily with breakfast.   fluPHENAZine  10 MG tablet Commonly known as: PROLIXIN  Take 20 mg by mouth in the morning and at bedtime.   ibuprofen  600 MG tablet Commonly known as: ADVIL  Take 600 mg by mouth 2 (two) times daily as needed.   Latuda  120 MG Tabs Generic drug: Lurasidone  HCl Take 120 mg by mouth daily.   lidocaine  5 % Commonly known as: LIDODERM  Place 1-3 patches onto the skin daily. NEED SITE(S) SPECIFIED, FREQUENCY CONFIRMED AND NEED PARAMETERS FOR NUMBER OF PATCHES 12 HRS ON 12 HRS OFF   lithium  carbonate 150 MG  capsule Take 1 capsule (150 mg total) by mouth at bedtime.   loratadine  10 MG tablet Commonly known as: CLARITIN  Take 10 mg by mouth daily.   LORazepam  0.5 MG tablet Commonly known as: ATIVAN  Take 1 tablet (0.5 mg total) by mouth in the morning, at noon, in the evening, and at bedtime.   melatonin 5 MG Tabs Take 5 mg by mouth at bedtime.   metoprolol  tartrate 25 MG tablet Commonly known as: LOPRESSOR  Take 25 mg by mouth 2 (two) times daily.   multivitamins ther. w/minerals Tabs tablet Take 1 tablet by mouth daily.   polyethylene glycol powder 17 GM/SCOOP powder Commonly known as: GLYCOLAX /MIRALAX  Take 17 g by mouth daily.   sertraline  100 MG tablet Commonly known as: ZOLOFT  Take 150 mg by mouth daily.   simethicone  80 MG chewable tablet Commonly known as: MYLICON Chew 1 tablet (80 mg total) by mouth 4 (four) times daily.   SYSTANE BALANCE OP Place 1 drop into both eyes 3 (three) times daily.   Vagisil Anti-Itch Medicated 1 % Misc Generic drug: Pramoxine HCl Apply vaginally once a day Apply 1 application topically daily as needed (for vaginal itching). Apply vaginally once a day What changed:  how much to take how to take this when to take this reasons to take  this additional instructions               Discharge Care Instructions  (From admission, onward)           Start     Ordered   02/15/24 0000  Change dressing on IV access line weekly and PRN  (Home infusion instructions - [facility] )        02/15/24 1159            Follow-up Information     PCP Follow up in 1 week(s).          [facility] Follow up in 1 week(s).   Contact information: [address] [place] [address] [place]  [address] [phone]               Allergies  Allergen Reactions   Sulfonamide Derivatives Hives         Consultations: Urology   Procedures/Studies: CT HEMATURIA WORKUP Result Date: 02/13/2024 CLINICAL DATA:  Lower abdominal pain and hematuria. EXAM: CT ABDOMEN AND PELVIS WITHOUT AND WITH CONTRAST TECHNIQUE: Multidetector CT imaging of the abdomen and pelvis was performed following the standard protocol before and following the bolus administration of intravenous contrast. RADIATION DOSE REDUCTION: This exam was performed according to the departmental dose-optimization program which includes automated exposure control, adjustment of the mA and/or kV according to patient size and/or use of iterative reconstruction technique. CONTRAST:  OMNIPAQUE  IOHEXOL  300 MG/ML  SOLN COMPARISON:  02/09/2024 FINDINGS: Despite efforts by the technologist and patient, motion artifact is present on today's exam and could not be eliminated. This reduces exam sensitivity and specificity. Lower chest: Aortic and dense coronary atherosclerosis. Small pericardial effusion. Mild cardiomegaly. Small amount of fluid in the left major fissure. Linear subsegmental atelectasis in the right lower lobe. Hepatobiliary: Small calcification dependently in the gallbladder suspicious for cholelithiasis. Otherwise unremarkable. Pancreas: Unremarkable Spleen: Unremarkable Adrenals/Urinary Tract: Stable scarring in the right kidney. At  least partially duplicated right renal collecting system. No current hydronephrosis. Foley catheter is present in the urinary bladder which is otherwise collapsed. Adrenal glands unremarkable. Stomach/Bowel: Prominent stool throughout the colon favors constipation. Mild prominence of scattered gas  in the small bowel without overtly dilated small bowel. Assessment of bowel significantly adversely affected by motion artifact. Vascular/Lymphatic: Atherosclerosis is present, including aortoiliac atherosclerotic disease. Considerable atheromatous plaque at the origin of the celiac trunk. The celiac trunk opacifies, accordingly is not felt to be totally occluded. Lesser atheromatous plaque at the origin of the SMA. Reproductive: Unremarkable Other: Trace free pelvic fluid. Musculoskeletal: Markedly severe dextroconvex thoracic and moderate levoconvex lumbar scoliosis with postoperative findings in the spine. Healing left rib fractures similar to previous. Stable vertical fractures of the sacral ala with transverse sclerotic healing fracture at the S2 vertebral level compatible with sacral insufficiency fractures. Left total hip prosthesis with cerclage wires. Stable appearance of fractures of the right pubic rami and right pubic body. Transverse sclerosis in the T10 vertebral body compatible with fracture, no change from previous. Substantial degenerative endplate findings at L4-5 and L5-S1 associated with spondylosis and foraminal narrowing at both levels. There is also right foraminal stenosis at L3-4. IMPRESSION: 1. Prominent stool throughout the colon favors constipation. 2. Foley catheter is present in the urinary bladder which is otherwise collapsed. No current hydronephrosis. Stable scarring in the right kidney. 3. Small pericardial effusion. Mild cardiomegaly. 4. Cholelithiasis. 5. Stable appearance of fractures of the sacral ala, S2 vertebral level, right pubic rami, and right pubic body. 6. Markedly severe  dextroconvex thoracic and moderate levoconvex lumbar scoliosis with postoperative findings in the spine. 7. Substantial degenerative endplate findings at L4-5 and L5-S1 associated with spondylosis and foraminal narrowing at both levels. There is also right foraminal stenosis at L3-4. 8. Trace free pelvic fluid. 9. Aortic Atherosclerosis (ICD10-I70.0). Coronary and systemic atherosclerosis. Electronically Signed   By: Freida Jes M.D.   On: 02/13/2024 14:16   US  Abdomen Limited RUQ (LIVER/GB) Result Date: 02/13/2024 CLINICAL DATA:  Abnormal liver function tests. EXAM: ULTRASOUND ABDOMEN LIMITED RIGHT UPPER QUADRANT COMPARISON:  CT abdomen pelvis 02/09/2024. FINDINGS: Gallbladder: No gallstones or wall thickening visualized. No sonographic Murphy sign noted by sonographer. Common bile duct: Diameter: 3 mm, within normal limits. No intrahepatic biliary ductal dilatation. Liver: Liver margin is irregular. Small perihepatic ascites. Portal vein is patent on color Doppler imaging with normal direction of blood flow towards the liver. Other: None. IMPRESSION: 1. No acute findings. 2. Irregular liver contour suggests cirrhosis. Please correlate clinically. 3. Small perihepatic ascites. Electronically Signed   By: Shearon Denis M.D.   On: 02/13/2024 09:56   DG CHEST PORT 1 VIEW Result Date: 02/13/2024 CLINICAL DATA:  Leukocytosis. EXAM: PORTABLE CHEST 1 VIEW COMPARISON:  09/22/2023 FINDINGS: The cardio pericardial silhouette is enlarged. Left lung clear. Interval development of collapse/consolidation with probable small effusion at the right base, suspicious for pneumonia. Bones are diffusely demineralized. Thoracolumbar fusion hardware evident with underlying scoliosis. IMPRESSION: Interval development of collapse/consolidation with probable small effusion at the right base, suspicious for pneumonia. Electronically Signed   By: Donnal Fusi M.D.   On: 02/13/2024 09:42   CT ABDOMEN PELVIS W CONTRAST Result  Date: 02/09/2024 EXAM: CT ABDOMEN AND PELVIS WITH CONTRAST 02/09/2024 10:47:56 PM TECHNIQUE: CT of the abdomen and pelvis was performed with the administration of intravenous contrast. Multiplanar reformatted images are provided for review. Automated exposure control, iterative reconstruction, and/or weight based adjustment of the mA/kV was utilized to reduce the radiation dose to as low as reasonably achievable. COMPARISON: 09/24/2023 CLINICAL HISTORY: Vaginal bleeding. Pt EMS from [facility] with reports abdominal pain, vaginal itching and pain for the last day. Staff at [facility] noted some vaginal  bleeding today. Pt alert and oriented X 4. FINDINGS: LOWER CHEST: No acute abnormality. LIVER: The liver is unremarkable. GALLBLADDER AND BILE DUCTS: Gallbladder is unremarkable. No biliary ductal dilatation. SPLEEN: No acute abnormality. PANCREAS: No acute abnormality. ADRENAL GLANDS: No acute abnormality. KIDNEYS, URETERS AND BLADDER: Mild bilateral hydroureteronephrosis extending to the UVJ, suggesting sequela of bladder outlet obstruction. Distended bladder with hemorrhage. No stones in the kidneys or ureters. No perinephric or periureteral stranding. GI AND BOWEL: Normal appendix (image 57). Stomach demonstrates no acute abnormality. There is no bowel obstruction. No bowel wall thickening. PERITONEUM AND RETROPERITONEUM: No ascites. No free air. VASCULATURE: Atherosclerotic calcifications of the abdominal aorta and branch vessels, although patent. LYMPH NODES: No lymphadenopathy. REPRODUCTIVE ORGANS: No acute abnormality. BONES AND SOFT TISSUES: Old bilateral sacral and right pelvic fractures. Left hip arthroplasty. S-shaped thoracolumbar scoliosis with multilevel degenerative changes, including mild inferior endplate changes at T10 which are chronic. No acute osseous abnormality. No focal soft tissue abnormality. IMPRESSION: 1. Distended bladder with hemorrhage. 2. Mild bilateral hydroureteronephrosis  extending to the UVJ, suggesting sequela of bladder obstruction. Electronically signed by: Zadie Herter MD 02/09/2024 11:24 PM EDT RP Workstation: [id number]     Discharge Exam: Vitals:   02/14/24 2010 02/15/24 0349  BP: 123/80 129/76  Pulse: 81 81  Resp: 15 15  Temp: 98 F (36.7 C) 98.9 F (37.2 C)  SpO2: 100% 96%   Vitals:   02/14/24 1147 02/14/24 1512 02/14/24 2010 02/15/24 0349  BP: 113/69 122/70 123/80 129/76  Pulse: 92 78 81 81  Resp: 19 18 15 15   Temp: 98.7 F (37.1 C) 98.3 F (36.8 C) 98 F (36.7 C) 98.9 F (37.2 C)  TempSrc: Oral Oral    SpO2: 100% 98% 100% 96%  Weight:      Height:        General: Pt is alert, awake, not in acute distress Cardiovascular: RRR, S1/S2 +, no rubs, no gallops Respiratory: CTA bilaterally, no wheezing, no rhonchi Abdominal: Soft, NT, ND, bowel sounds + Extremities: no edema, no cyanosis    The results of significant diagnostics from this hospitalization (including imaging, microbiology, ancillary and laboratory) are listed below for reference.     Microbiology: Recent Results (from the past 240 hours)  Urine Culture     Status: Abnormal   Collection Time: 02/09/24 12:21 AM   Specimen: In/Out Cath Urine  Result Value Ref Range Status   Specimen Description   Final    IN/OUT CATH URINE Performed at [facility], [address], [place], [place] [address]    Special Requests   Final    NONE Performed at [facility], [address] [place], [place] [address]    Culture (A)  Final    60,000 COLONIES/mL KLEBSIELLA PNEUMONIAE Confirmed Extended Spectrum Beta-Lactamase Producer (ESBL).  In bloodstream infections from ESBL organisms, carbapenems are preferred over piperacillin/tazobactam. They are shown to have a lower risk of mortality. Two isolates with different morphologies were identified as the same organism.The most resistant organism was reported. Performed at [facility], [address] [place], [place] [address]    Report Status 02/12/2024 FINAL  Final   Organism ID, Bacteria KLEBSIELLA PNEUMONIAE (A)  Final      Susceptibility   Klebsiella pneumoniae - MIC*    AMPICILLIN >=32 RESISTANT Resistant     CEFAZOLIN  >=64 RESISTANT Resistant     CEFEPIME  >=32 RESISTANT Resistant     CEFTRIAXONE  >=64 RESISTANT Resistant  CIPROFLOXACIN <=0.25 SENSITIVE Sensitive     GENTAMICIN >=16 RESISTANT Resistant     IMIPENEM <=0.25 SENSITIVE Sensitive     NITROFURANTOIN 64 INTERMEDIATE Intermediate     TRIMETH/SULFA <=20 SENSITIVE Sensitive     AMPICILLIN/SULBACTAM 16 INTERMEDIATE Intermediate     PIP/TAZO <=4 SENSITIVE Sensitive ug/mL    * 60,000 COLONIES/mL KLEBSIELLA PNEUMONIAE  Urine Culture     Status: Abnormal   Collection Time: 02/09/24 12:21 AM   Specimen: Urine, Random  Result Value Ref Range Status   Specimen Description   Final    URINE, RANDOM Performed at [facility], [address], [place], [place] [address]    Special Requests   Final    NONE Reflexed from [id number] Performed at [facility], [address] [place], [place] [address]    Culture (A)  Final    40,000 COLONIES/mL KLEBSIELLA PNEUMONIAE Confirmed Extended Spectrum Beta-Lactamase Producer (ESBL).  In bloodstream infections from ESBL organisms, carbapenems are preferred over piperacillin/tazobactam. They are shown to have a lower risk of mortality.    Report Status 02/12/2024 FINAL  Final   Organism ID, Bacteria KLEBSIELLA PNEUMONIAE (A)  Final      Susceptibility   Klebsiella pneumoniae - MIC*    AMPICILLIN >=32 RESISTANT Resistant     CEFAZOLIN  >=64 RESISTANT Resistant     CEFEPIME  >=32 RESISTANT Resistant     CEFTRIAXONE  >=64 RESISTANT Resistant     CIPROFLOXACIN 0.5 INTERMEDIATE Intermediate     GENTAMICIN >=16 RESISTANT Resistant     IMIPENEM <=0.25 SENSITIVE Sensitive     NITROFURANTOIN 64 INTERMEDIATE Intermediate      TRIMETH/SULFA <=20 SENSITIVE Sensitive     AMPICILLIN/SULBACTAM >=32 RESISTANT Resistant     PIP/TAZO <=4 SENSITIVE Sensitive ug/mL    * 40,000 COLONIES/mL KLEBSIELLA PNEUMONIAE  Culture, blood (Routine X 2) w Reflex to ID Panel     Status: None (Preliminary result)   Collection Time: 02/12/24  8:48 PM   Specimen: BLOOD  Result Value Ref Range Status   Specimen Description   Final    BLOOD BLOOD RIGHT ARM Performed at [facility], [address], [place], [place] [address]    Special Requests   Final    BOTTLES DRAWN AEROBIC ONLY Blood Culture results may not be optimal due to an inadequate volume of blood received in culture bottles Performed at [facility], [address], [place], [place] [address]    Culture   Final    NO GROWTH 3 DAYS Performed at [facility], [address] [address], [place], [place] [address]    Report Status PENDING  Incomplete  Culture, blood (Routine X 2) w Reflex to ID Panel     Status: None (Preliminary result)   Collection Time: 02/12/24  9:05 PM   Specimen: BLOOD LEFT HAND  Result Value Ref Range Status   Specimen Description   Final    BLOOD LEFT HAND Performed at [facility], [address] [place], [place] [address]    Special Requests   Final    BOTTLES DRAWN AEROBIC ONLY Blood Culture results may not be optimal due to an inadequate volume of blood received in culture bottles Performed at [facility], [address], [place], [place] [address]    Culture   Final    NO GROWTH 3 DAYS Performed at [facility], [address] [place], [place] [address]    Report Status PENDING  Incomplete     Labs: BNP (last 3 results) No results for input(s): BNP in the last 8760 hours. Basic Metabolic Panel: Recent Labs  Lab 02/10/24 0956 02/11/24 0958 02/12/24 1033 02/13/24 0420 02/14/24 1624 02/15/24 0624  NA 131* 137 136 139 137 138  K 4.7 4.1 3.5 3.7 4.0 4.2  CL 107  112* 110 115* 110 109  CO2 16* 16* 18* 16* 20* 19*  GLUCOSE 144* 117* 119* 99 87 79  BUN 23 26* 21 18 22 21   CREATININE 1.39* 1.27* 1.08* 1.06* 1.48* 1.31*  CALCIUM  9.0 9.3 9.8 9.5 9.3 9.6  MG 1.8 2.0 2.1 2.0  --   --   PHOS 3.5 3.4 2.6 2.3*  --   --    Liver Function Tests: Recent Labs  Lab 02/11/24 0958 02/12/24 1033 02/13/24 0420 02/14/24 1624 02/15/24 0624  AST 41 148* 191* 242* 179*  ALT 35 84* 131* 191* 187*  ALKPHOS 149* 178* 157* 171* 178*  BILITOT 0.5 0.6 0.7 0.7 0.8  PROT 5.8* 6.6 5.8* 5.9* 6.6  ALBUMIN 2.1* 2.2* 2.0* 1.9* 2.2*   No results for input(s): LIPASE, AMYLASE in the last 168 hours. No results for input(s): AMMONIA in the last 168 hours. CBC: Recent Labs  Lab 02/13/24 0420 02/13/24 1649 02/14/24 0423 02/14/24 1624 02/15/24 0624  WBC 27.5* 25.7* 19.1* 20.2* 23.2*  NEUTROABS 24.2* 20.5* 14.9* 15.7* 18.8*  HGB 7.4* 7.0* 6.8* 8.8* 9.5*  HCT 23.3* 21.5* 22.3* 28.1* 30.6*  MCV 93.2 88.5 94.5 93.0 94.7  PLT 333 270 364 353 419*   Cardiac Enzymes: No results for input(s): CKTOTAL, CKMB, CKMBINDEX, TROPONINI in the last 168 hours. BNP: Invalid input(s): POCBNP CBG: No results for input(s): GLUCAP in the last 168 hours. D-Dimer No results for input(s): DDIMER in the last 72 hours. Hgb A1c No results for input(s): HGBA1C in the last 72 hours. Lipid Profile No results for input(s): CHOL, HDL, LDLCALC, TRIG, CHOLHDL, LDLDIRECT in the last 72 hours. Thyroid function studies No results for input(s): TSH, T4TOTAL, T3FREE, THYROIDAB in the last 72 hours.  Invalid input(s): FREET3 Anemia work up No results for input(s): VITAMINB12, FOLATE, FERRITIN, TIBC, IRON, RETICCTPCT in the last 72 hours. Urinalysis    Component Value Date/Time   COLORURINE RED (A) 02/09/2024 0021   APPEARANCEUR TURBID (A) 02/09/2024 0021   LABSPEC  02/09/2024 0021    TEST NOT REPORTED DUE TO COLOR INTERFERENCE OF URINE  PIGMENT   PHURINE  02/09/2024 0021    TEST NOT REPORTED DUE TO COLOR INTERFERENCE OF URINE PIGMENT   GLUCOSEU (A) 02/09/2024 0021    TEST NOT REPORTED DUE TO COLOR INTERFERENCE OF URINE PIGMENT   HGBUR (A) 02/09/2024 0021    TEST NOT REPORTED DUE TO COLOR INTERFERENCE OF URINE PIGMENT   BILIRUBINUR (A) 02/09/2024 0021    TEST NOT REPORTED DUE TO COLOR INTERFERENCE OF URINE PIGMENT   KETONESUR (A) 02/09/2024 0021    TEST NOT REPORTED DUE TO COLOR INTERFERENCE OF URINE PIGMENT   PROTEINUR (A) 02/09/2024 0021    TEST NOT REPORTED DUE TO COLOR INTERFERENCE OF URINE PIGMENT   UROBILINOGEN 1.0 08/05/2011 1751   NITRITE (A) 02/09/2024 0021    TEST NOT REPORTED DUE TO COLOR INTERFERENCE OF URINE PIGMENT   LEUKOCYTESUR (A) 02/09/2024 0021    TEST NOT REPORTED DUE TO COLOR INTERFERENCE OF URINE PIGMENT   Sepsis Labs Recent Labs  Lab 02/13/24 1649 02/14/24 0423 02/14/24 1624 02/15/24 0624  WBC 25.7* 19.1* 20.2* 23.2*  Microbiology Recent Results (from the past 240 hours)  Urine Culture     Status: Abnormal   Collection Time: 02/09/24 12:21 AM   Specimen: In/Out Cath Urine  Result Value Ref Range Status   Specimen Description   Final    IN/OUT CATH URINE Performed at [facility], [address], [place], [place] [address]    Special Requests   Final    NONE Performed at [facility], [address], [place], [place] [address]    Culture (A)  Final    60,000 COLONIES/mL KLEBSIELLA PNEUMONIAE Confirmed Extended Spectrum Beta-Lactamase Producer (ESBL).  In bloodstream infections from ESBL organisms, carbapenems are preferred over piperacillin/tazobactam. They are shown to have a lower risk of mortality. Two isolates with different morphologies were identified as the same organism.The most resistant organism was reported. Performed at [facility], [address], [place], [place] [address]    Report Status 02/12/2024 FINAL  Final    Organism ID, Bacteria KLEBSIELLA PNEUMONIAE (A)  Final      Susceptibility   Klebsiella pneumoniae - MIC*    AMPICILLIN >=32 RESISTANT Resistant     CEFAZOLIN  >=64 RESISTANT Resistant     CEFEPIME  >=32 RESISTANT Resistant     CEFTRIAXONE  >=64 RESISTANT Resistant     CIPROFLOXACIN <=0.25 SENSITIVE Sensitive     GENTAMICIN >=16 RESISTANT Resistant     IMIPENEM <=0.25 SENSITIVE Sensitive     NITROFURANTOIN 64 INTERMEDIATE Intermediate     TRIMETH/SULFA <=20 SENSITIVE Sensitive     AMPICILLIN/SULBACTAM 16 INTERMEDIATE Intermediate     PIP/TAZO <=4 SENSITIVE Sensitive ug/mL    * 60,000 COLONIES/mL KLEBSIELLA PNEUMONIAE  Urine Culture     Status: Abnormal   Collection Time: 02/09/24 12:21 AM   Specimen: Urine, Random  Result Value Ref Range Status   Specimen Description   Final    URINE, RANDOM Performed at [facility], [address] [place], [place] [address]    Special Requests   Final    NONE Reflexed from [id number] Performed at [facility], [address] [place], [place] [address]    Culture (A)  Final    40,000 COLONIES/mL KLEBSIELLA PNEUMONIAE Confirmed Extended Spectrum Beta-Lactamase Producer (ESBL).  In bloodstream infections from ESBL organisms, carbapenems are preferred over piperacillin/tazobactam. They are shown to have a lower risk of mortality.    Report Status 02/12/2024 FINAL  Final   Organism ID, Bacteria KLEBSIELLA PNEUMONIAE (A)  Final      Susceptibility   Klebsiella pneumoniae - MIC*    AMPICILLIN >=32 RESISTANT Resistant     CEFAZOLIN  >=64 RESISTANT Resistant     CEFEPIME  >=32 RESISTANT Resistant     CEFTRIAXONE  >=64 RESISTANT Resistant     CIPROFLOXACIN 0.5 INTERMEDIATE Intermediate     GENTAMICIN >=16 RESISTANT Resistant     IMIPENEM <=0.25 SENSITIVE Sensitive     NITROFURANTOIN 64 INTERMEDIATE Intermediate     TRIMETH/SULFA <=20 SENSITIVE Sensitive     AMPICILLIN/SULBACTAM >=32 RESISTANT Resistant     PIP/TAZO  <=4 SENSITIVE Sensitive ug/mL    * 40,000 COLONIES/mL KLEBSIELLA PNEUMONIAE  Culture, blood (Routine X 2) w Reflex to ID Panel     Status: None (Preliminary result)   Collection Time: 02/12/24  8:48 PM   Specimen: BLOOD  Result Value Ref Range Status   Specimen Description   Final    BLOOD BLOOD RIGHT ARM Performed at [facility], [address] [place], [place] [address]  Special Requests   Final    BOTTLES DRAWN AEROBIC ONLY Blood Culture results may not be optimal due to an inadequate volume of blood received in culture bottles Performed at [facility], [address], [place], [place] [address]    Culture   Final    NO GROWTH 3 DAYS Performed at [facility], [address] [place], [place] [address]    Report Status PENDING  Incomplete  Culture, blood (Routine X 2) w Reflex to ID Panel     Status: None (Preliminary result)   Collection Time: 02/12/24  9:05 PM   Specimen: BLOOD LEFT HAND  Result Value Ref Range Status   Specimen Description   Final    BLOOD LEFT HAND Performed at [facility], [address], [place], [place] [address]    Special Requests   Final    BOTTLES DRAWN AEROBIC ONLY Blood Culture results may not be optimal due to an inadequate volume of blood received in culture bottles Performed at [facility], [address], [place], [place] [address]    Culture   Final    NO GROWTH 3 DAYS Performed at [facility], [address], [place], [place] [address]    Report Status PENDING  Incomplete    FURTHER DISCHARGE INSTRUCTIONS:   Get Medicines reviewed and adjusted: Please take all your medications with you for your next visit with your Primary MD   Laboratory/radiological data: Please request your Primary MD to go over all hospital tests and procedure/radiological results at the follow up, please ask your Primary MD to get all Hospital records sent to his/her office.   In  some cases, they will be blood work, cultures and biopsy results pending at the time of your discharge. Please request that your primary care M.D. goes through all the records of your hospital data and follows up on these results.   Also Note the following: If you experience worsening of your admission symptoms, develop shortness of breath, life threatening emergency, suicidal or homicidal thoughts you must seek medical attention immediately by calling 911 or calling your MD immediately  if symptoms less severe.   You must read complete instructions/literature along with all the possible adverse reactions/side effects for all the Medicines you take and that have been prescribed to you. Take any new Medicines after you have completely understood and accpet all the possible adverse reactions/side effects.    patient was instructed, not to drive, operate heavy machinery, perform activities at heights, swimming or participation in water activities or provide baby-sitting services while on Pain, Sleep and Anxiety Medications; until their outpatient Physician has advised to do so again. Also recommended to not to take more than prescribed Pain, Sleep and Anxiety Medications.  It is not advisable to combine anxiety, sleep and pain medications without talking with your primary care provider.     Wear Seat belts while driving.   Please note: You were cared for by a hospitalist during your hospital stay. Once you are discharged, your primary care physician will handle any further medical issues. Please note that NO REFILLS for any discharge medications will be authorized once you are discharged, as it is imperative that you return to your primary care physician (or establish a relationship with a primary care physician if you do not have one) for your post hospital discharge needs so that they can reassess your need for medications and monitor your lab values  Time  coordinating discharge: Over 30  minutes  SIGNED:   Modena Andes, MD  [facility] 02/15/2024, 1:12 PM *Please note that this is a verbal dictation therefore any spelling or grammatical errors are due to the Dragon Medical One system interpretation. If 7PM-7AM, please contact night-coverage [email or web address]

## 2024-02-15 NOTE — Plan of Care (Signed)
 Patient's discharge was completed and after that, I was informed by TOC that patient's current ALF does not take patients with the PICC line which patient has and needs for prolonged IV antibiotics and thus discharge needed to be canceled.  TOC is now pursuing SNF, family has agreed.

## 2024-02-15 NOTE — TOC PASRR Note (Signed)
 30 Day PASRR Note  Patient Details  Name: Heidi Blevins Date of Birth: 16-Mar-1951  Transition of Care [facility]) CM/SW Contact:    Zenon Hilda, LCSW Phone Number: 02/15/2024, 1:57 PM  To Whom It May Concern:  Please be advised that this patient will require a short-term nursing home stay - anticipated 30 days or less for rehabilitation and strengthening. The plan is for return to ALF.

## 2024-02-15 NOTE — Consult Note (Signed)
 [facility]    Date of Admission:  02/09/2024   Total days of inpatient antibiotics 3        Reason for Consult: UTI    Principal Problem:   Bladder obstruction Active Problems:   Hydroureteronephrosis   Anemia   Bladder hemorrhage   ABLA (acute blood loss anemia)   Assessment: 73 year old female with schizoaffective disorder, multiple UTIs, CKD stage III AA, anxiety/depression presented with abdominal pain, vaginal itching.  CT abdomen show pelvis showed distended bladder with hemorrhage and mild bilateral hydro ureter nephrosis.  Urology consulted.  Patient underwent three-way Foley catheter and bladder irrigation.  Urine cultures from In-N-Out catheter grew ESBL Klebsiella. #Complicated UTI with ESBL Klebsiella - Patient was initially started ceftriaxone  and then transition to meropenem. - Recommended placing midline and completing 2 weeks of Carbapenem based therapy EOT 6/30 - Relayed to primary.  Midline order placed. - ID will sign off.  Evaluation of this patient requires complex antimicrobial therapy evaluation and counseling + isolation needs for disease transmission risk assessment and mitigation    Recommendations:   OPAT ORDERS:  Diagnosis: complicated uti  Culture Result:  ESBL Klebsiella UTI   Allergies  Allergen Reactions   Sulfonamide Derivatives Hives          Discharge antibiotics to be given via PICC line:  Per pharmacy protocol ertapenem  500 mg q24h     End Date: 01/29/24  [facility] Care Per Protocol with Biopatch Use: Home health RN for IV administration and teaching, line care and labs.    Labs weekly while on IV antibiotics: x__ CBC with differential __ BMP **TWICE WEEKLY ON VANCOMYCIN   x__ CMP __ CRP __ ESR __ Vancomycin  trough TWICE WEEKLY __ CK  __ Please pull PIC at completion of IV antibiotics __ Please leave PIC in place until doctor has seen patient or been notified  Fax weekly labs to  [phone]  Clinic Follow Up Appt: 7/10  @ [facility] with Dr. Zelda Hickman  Microbiology:   Antibiotics: ctx 01/1505/17 Merrem 6/17-   Cultures: Blood 6/17 ng Urine  Other 6/13 ecoli esbl  HPI: Heidi Blevins is a 73 y.o. female with history of hypertension, multiple UTIs, CVA, schizoaffective disorder, depression/anxiety, insomnia, CKD stage A, chronic diastolic heart failure, left renal mass and anemia presented to ED with lower abdominal pain.  She has some dysuria, hematuria and vaginal itching.  CTAP showed distended gallbladder with hemorrhage and mild bilateral hydroureteronephrosis.  Patient admitted for bladder obstruction and bilateral hydro utero nephrosis.Urology was engaged.  Found to have gross hematuria of unclear etiology.  Patient had three-way Foley catheter and bladder irrigation completed by EDP.  Urine cultures from an Grew ESBL Klebsiella.  Patient was transition ceftriaxone  to meropenem.   Review of Systems: Review of Systems  All other systems reviewed and are negative.   Past Medical History:  Diagnosis Date   Anxiety    Arrhythmia    Bifascicular block    Chronic diastolic heart failure (HCC)    Depression    Hypertension    Insomnia    Schizoaffective disorder    Scoliosis    Thyroid nodule 08/09/2020   Vitamin D  deficiency     Social History   Tobacco Use   Smoking status: Every Day    Current packs/day: 0.25    Average packs/day: 0.3 packs/day for 40.0 years (10.0 ttl pk-yrs)    Types: Cigarettes   Smokeless tobacco:  Never  Substance Use Topics   Alcohol  use: No   Drug use: No    Family History  Problem Relation Age of Onset   CVA Mother    Hypertension Mother    Lupus Sister    Hypertension Brother    Peptic Ulcer Disease Father    Colon cancer Neg Hx    Scheduled Meds:  benztropine   1 mg Oral BID   Chlorhexidine  Gluconate Cloth  6 each Topical Daily   fluPHENAZine   20 mg Oral BID   lithium  carbonate  150 mg Oral QHS    LORazepam   0.5 mg Oral QID   lurasidone   120 mg Oral Daily   sertraline   150 mg Oral Daily   sodium bicarbonate  650 mg Oral BID   Continuous Infusions:  meropenem (MERREM) IV 1 g (02/15/24 2100)   sodium chloride  irrigation 0 mL (02/10/24 0715)   PRN Meds:.acetaminophen  **OR** acetaminophen , HYDROmorphone  (DILAUDID ) injection, ondansetron  **OR** ondansetron  (ZOFRAN ) IV, senna-docusate, traMADol  Allergies  Allergen Reactions   Sulfonamide Derivatives Hives         OBJECTIVE: Blood pressure (!) 142/81, pulse 88, temperature 98.8 F (37.1 C), resp. rate 16, height 5' (1.524 m), weight 49.8 kg, SpO2 94%.  Physical Exam Constitutional:      Appearance: Normal appearance.  HENT:     Head: Normocephalic and atraumatic.     Right Ear: Tympanic membrane normal.     Left Ear: Tympanic membrane normal.     Nose: Nose normal.     Mouth/Throat:     Mouth: Mucous membranes are moist.   Eyes:     Extraocular Movements: Extraocular movements intact.     Conjunctiva/sclera: Conjunctivae normal.     Pupils: Pupils are equal, round, and reactive to light.    Cardiovascular:     Rate and Rhythm: Normal rate and regular rhythm.     Heart sounds: No murmur heard.    No friction rub. No gallop.  Pulmonary:     Effort: Pulmonary effort is normal.     Breath sounds: Normal breath sounds.  Abdominal:     General: Abdomen is flat.     Palpations: Abdomen is soft.   Musculoskeletal:        General: Normal range of motion.   Skin:    General: Skin is warm and dry.   Neurological:     General: No focal deficit present.     Mental Status: She is alert and oriented to person, place, and time.   Psychiatric:        Mood and Affect: Mood normal.     Lab Results Lab Results  Component Value Date   WBC 23.2 (H) 02/15/2024   HGB 9.5 (L) 02/15/2024   HCT 30.6 (L) 02/15/2024   MCV 94.7 02/15/2024   PLT 419 (H) 02/15/2024    Lab Results  Component Value Date   CREATININE 1.31 (H)  02/15/2024   BUN 21 02/15/2024   NA 138 02/15/2024   K 4.2 02/15/2024   CL 109 02/15/2024   CO2 19 (L) 02/15/2024    Lab Results  Component Value Date   ALT 187 (H) 02/15/2024   AST 179 (H) 02/15/2024   ALKPHOS 178 (H) 02/15/2024   BILITOT 0.8 02/15/2024       Orlie Bjornstad, MD [facility] 02/15/2024, 10:48 PM

## 2024-02-15 NOTE — Progress Notes (Signed)
 PHARMACY CONSULT NOTE FOR:  OUTPATIENT  PARENTERAL ANTIBIOTIC THERAPY (OPAT)  Indication: ESBL Klebsiella UTI Regimen: Ertapenem 500 mg IV Q 24 hours End date: 02/25/24   IV antibiotic discharge orders are pended. To discharging provider:  please sign these orders via discharge navigator,  Select New Orders & click on the button choice - Manage This Unsigned Work.     Thank you for allowing pharmacy to be a part of this patient's care.  Denson Flake, PharmD, BCPS, BCIDP Infectious Diseases Clinical Pharmacist Phone: [phone] 02/15/2024, 11:57 AM

## 2024-02-15 NOTE — TOC Progression Note (Signed)
 Transition of Care [facility]) - Progression Note   Patient Details  Name: Heidi Blevins MRN: [id number] Date of Birth: [birth year]  [facility] CM/SW Contact  Zenon Hilda, LCSW Phone Number: 02/15/2024, 2:45 PM  Clinical Narrative: CSW notified patient will need to discharge on IV antibiotics. CSW spoke with Moira Andrews at [facility] and the facility does not allow PICC lines, so patient will need SNF. CSW spoke with son, Keyshawna Prouse, and he is agreeable to SNF. Son requested that [facility] follow up with his brother, Polly Brink, to go over bed offers as he will be intermittently available. FL2 done; PASRR pending. Initial referral faxed out. Patient will require a level II PASRR. TOC to upload clinicals to [place] MUST once FL2 and 30 day note are cosigned.  Expected Discharge Plan: Skilled Nursing Facility Barriers to Discharge: English as a second language teacher, SNF Pending bed offer, Awaiting State Approval (PASRR)  Expected Discharge Plan and Services In-house Referral: Clinical Social Work Post Acute Care Choice: Skilled Nursing Facility Living arrangements for the past 2 months: Assisted Living Facility Expected Discharge Date: 02/15/24               DME Arranged: N/A DME Agency: NA  Social Determinants of Health (SDOH) Interventions SDOH Screenings   Food Insecurity: No Food Insecurity (02/10/2024)  Housing: Low Risk  (02/10/2024)  Transportation Needs: No Transportation Needs (02/10/2024)  Utilities: Not At Risk (02/10/2024)  Social Connections: Moderately Integrated (02/10/2024)  Tobacco Use: High Risk (02/09/2024)   Readmission Risk Interventions    09/24/2023   12:58 PM  Readmission Risk Prevention Plan  Post Dischage Appt Complete  Medication Screening Complete  Transportation Screening Complete

## 2024-02-16 ENCOUNTER — Other Ambulatory Visit: Payer: Self-pay

## 2024-02-16 DIAGNOSIS — N320 Bladder-neck obstruction: Secondary | ICD-10-CM | POA: Diagnosis not present

## 2024-02-16 LAB — CBC WITH DIFFERENTIAL/PLATELET
Abs Immature Granulocytes: 0.59 10*3/uL — ABNORMAL HIGH (ref 0.00–0.07)
Basophils Absolute: 0.1 10*3/uL (ref 0.0–0.1)
Basophils Relative: 0 %
Eosinophils Absolute: 0.3 10*3/uL (ref 0.0–0.5)
Eosinophils Relative: 1 %
HCT: 26.9 % — ABNORMAL LOW (ref 36.0–46.0)
Hemoglobin: 8.5 g/dL — ABNORMAL LOW (ref 12.0–15.0)
Immature Granulocytes: 3 %
Lymphocytes Relative: 9 %
Lymphs Abs: 1.9 10*3/uL (ref 0.7–4.0)
MCH: 29 pg (ref 26.0–34.0)
MCHC: 31.6 g/dL (ref 30.0–36.0)
MCV: 91.8 fL (ref 80.0–100.0)
Monocytes Absolute: 1.4 10*3/uL — ABNORMAL HIGH (ref 0.1–1.0)
Monocytes Relative: 6 %
Neutro Abs: 17.1 10*3/uL — ABNORMAL HIGH (ref 1.7–7.7)
Neutrophils Relative %: 81 %
Platelets: 387 10*3/uL (ref 150–400)
RBC: 2.93 MIL/uL — ABNORMAL LOW (ref 3.87–5.11)
RDW: 15.2 % (ref 11.5–15.5)
WBC: 21.4 10*3/uL — ABNORMAL HIGH (ref 4.0–10.5)
nRBC: 0 % (ref 0.0–0.2)

## 2024-02-16 LAB — COMPREHENSIVE METABOLIC PANEL WITH GFR
ALT: 110 U/L — ABNORMAL HIGH (ref 0–44)
AST: 75 U/L — ABNORMAL HIGH (ref 15–41)
Albumin: 1.9 g/dL — ABNORMAL LOW (ref 3.5–5.0)
Alkaline Phosphatase: 146 U/L — ABNORMAL HIGH (ref 38–126)
Anion gap: 8 (ref 5–15)
BUN: 18 mg/dL (ref 8–23)
CO2: 21 mmol/L — ABNORMAL LOW (ref 22–32)
Calcium: 9.4 mg/dL (ref 8.9–10.3)
Chloride: 109 mmol/L (ref 98–111)
Creatinine, Ser: 1.01 mg/dL — ABNORMAL HIGH (ref 0.44–1.00)
GFR, Estimated: 59 mL/min — ABNORMAL LOW (ref >60)
Glucose, Bld: 79 mg/dL (ref 70–99)
Potassium: 4 mmol/L (ref 3.5–5.1)
Sodium: 138 mmol/L (ref 135–145)
Total Bilirubin: 0.7 mg/dL (ref 0.0–1.2)
Total Protein: 5.9 g/dL — ABNORMAL LOW (ref 6.5–8.1)

## 2024-02-16 MED ORDER — BISACODYL 10 MG RE SUPP
10.0000 mg | Freq: Once | RECTAL | Status: AC
  Administered 2024-02-16: 10 mg via RECTAL
  Filled 2024-02-16: qty 1

## 2024-02-16 MED ORDER — NYSTATIN 100000 UNIT/ML MT SUSP
5.0000 mL | Freq: Four times a day (QID) | OROMUCOSAL | Status: DC
  Administered 2024-02-16 – 2024-02-20 (×17): 500000 [IU] via ORAL
  Filled 2024-02-16 (×16): qty 5

## 2024-02-16 MED ORDER — FLEET ENEMA RE ENEM: 1.0000 | ENEMA | Freq: Every day | RECTAL | Status: DC | PRN

## 2024-02-16 NOTE — Progress Notes (Signed)
 PROGRESS NOTE    Heidi Blevins  FMW:[id number] DOB: 1951/08/20 DOA: 02/09/2024 PCP: Pcp, No   Brief Narrative:  The patient is a 73 year old African-American female with past medical history significant for Mannam to essential hypertension, multiple UTIs, history of CVA, history of schizoaffective disorder, depression and anxiety, insomnia, CKD stage IIIa, chronic diastolic CHF, history of left renal mass with anemia and other comorbidities who presented to the ED for evaluation of lower abdominal pain, vaginal itching and pain as well as hematuria.  She started having abdominal pain last night and had some dysuria, hematuria and vaginal itching and had some associated nausea, fevers and chills.  Further workup was done and UA was too bloody to report adequately but showed greater than 50 WBCs.  CT of the abdomen pelvis was done and showed distended bladder with hemorrhage and mild bilateral hydroureteronephrosis.  Received IM Cogentin  1 mg, fentanyl , Zofran , Rocephin  and urology was consulted and she was placed on IV fluid hydration.   Since last night her blood count has dropped and she has been to be typed and screened and transfused 2 units of PRBCs.  After the 2 units of blood count is improved and is stable at 8.4.  She continues to have a metabolic acidosis and WBC remains elevated. 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 Found to have abdominal distention, lower abdominal pain and gross hematuria on admission - CT A/P shows distended bladder with hemorrhage as well as mild bilateral hydroureteronephrosis extending to the UVJ - Urology  consulted, recommended three-way Foley catheter and bladder irrigation, this was completed by EDP; Urology recommends continuing catheter drainage at this time- Continue Foley catheter drainage.   Acute Cystitis versus Pyelonephritis urine culture growing ESBL Klebsiella so antibiotics switched from Rocephin  to meropenem .  ID consulted, based on the sensitivities, patient could only get Bactrim DS however patient is allergic to sulfa so we do not have any oral antibiotics options.  ID recommended continuing ertapenem  until 02/25/2024.  PICC line was placed 02/15/2024.   Left Renal Mass: Patient found to have a 2.6 x 3.0 cm left renal mass on imaging earlier this year. Followed by oncology and urology.  Urology following, appreciate recs and she has follow-up imaging planned next month;    Acute Blood Loss Anemia superimposed on Chronic Anemia secondary to gross hematuria - Followed by oncology in outpatient setting and underwent Bone Marrow Bx where pathology did not indicate underlying bone marrow disorder; Has undergone extensive workup for Anemia and it was felt her Anemia was an Inflammatory Process - Hgb of 8.5 on admission, dropped to 6.3 on 02/10/2024 for which she received 2 units of.  Recent transfusion, hemoglobin over 8 posttransfusion but dropped to 7.4.  Urology is now suspecting bleeding from the kidney, CT renal with contrast ordered by urology 02/13/2024 did not show any source of extravasation/bleeding.  Patient's hemoglobin once again dropped to 6.8 early morning on 02/14/2024 and 1 unit of PRBC transfusion is ordered.  Hemoglobin has remained over 8 since then.  Monitor daily.  AKI on CKD Stage 3a/ Metabolic Acidosis: Baseline creatinine between 0.9-1.05.  Creatinine peaked at 1.4.  She received IV  fluids.  Creatinine now down to 1.06 which is her baseline.   Essential HTN: BP initially elevated on admission, currently blood pressure is normal despite of holding antihypertensives so we  will continue to hold for now.    Anxiety and depression Schizoaffective disorder - Lithium  level subtherapeutic at 0.14 - Continue lithium , benztropine , lurasidone , fluphenazine  - Continue sertraline  and lorazepam    Abnormal LFTs/ Elevated LFTs: Elevated on Admission and had normalize but then worsened again.  Discontinued atorvastatin  02/13/2024.  Avoid hepatotoxic agents.  LFTs have started to improve now.   Hx of CVA  HLD Plavix  on hold in the setting of active bleeding.  Hold statin as mentioned above.  Disposition: Patient was going to be discharged on 02/15/2024 after PICC line placement.  All the discharge paperwork was completed.  However, I was informed by TOC afterwards that patient's assisted living facility does not take patients with the PICC line and thus discharge was canceled.  TOC is pursuing SNF after talking to the patient and the family.  However I was informed by the [facility] that patient needs PASSR in order to proceed and that department is closed until Monday.  DVT prophylaxis: SCDs Start: 02/10/24 0225   Code Status: Limited: Do not attempt resuscitation (DNR) -DNR-LIMITED -Do Not Intubate/DNI   Family Communication:  None present at bedside.   Status is: Inpatient Remains inpatient appropriate because: Medically stable, pending placement.  Estimated body mass index is 21.44 kg/m as calculated from the following:   Height as of this encounter: 5' (1.524 m).   Weight as of this encounter: 49.8 kg.    Nutritional Assessment: Body mass index is 21.44 kg/m.SABRA Seen by dietician.  I agree with the assessment and plan as outlined below: Nutrition Status:        . Skin Assessment: I have examined the patient's skin and I agree with the wound assessment as performed by the wound care RN as outlined below:    Consultants:  Urology  Procedures:  None  Antimicrobials:  Anti-infectives (From admission, onward)    Start     Dose/Rate Route Frequency Ordered  Stop   02/15/24 0000  ertapenem  (INVANZ ) IVPB        500 mg Intravenous Every 24 hours 02/15/24 1159 02/25/24 2359   02/12/24 1400  meropenem  (MERREM ) 1 g in sodium chloride  0.9 % 100 mL IVPB        1 g 200 mL/hr over 30 Minutes Intravenous Every 12 hours 02/12/24 1254     02/10/24 2300  cefTRIAXone  (ROCEPHIN ) 1 g in sodium chloride  0.9 % 100 mL IVPB  Status:  Discontinued        1 g 200 mL/hr over 30 Minutes Intravenous Every 24 hours 02/10/24 0247 02/12/24 1254   02/09/24 2315  cefTRIAXone  (ROCEPHIN ) 2 g in sodium chloride  0.9 % 100 mL IVPB        2 g 200 mL/hr over 30 Minutes Intravenous  Once 02/09/24 2306 02/09/24 2358         Subjective: Patient seen and examined.  She has no complaints.  Objective: Vitals:   02/14/24 1512 02/14/24 2010 02/15/24 0349 02/15/24 2029  BP: 122/70 123/80 129/76 (!) 142/81  Pulse: 78 81 81 88  Resp: 18 15 15 16   Temp: 98.3 F (36.8 C) 98 F (36.7 C) 98.9 F (37.2 C) 98.8 F (37.1 C)  TempSrc: Oral     SpO2: 98% 100% 96% 94%  Weight:      Height:  Intake/Output Summary (Last 24 hours) at 02/16/2024 1018 Last data filed at 02/16/2024 1009 Gross per 24 hour  Intake 630.56 ml  Output 1700 ml  Net -1069.44 ml   Filed Weights   02/09/24 1946 02/10/24 0356  Weight: 49.9 kg 49.8 kg    Examination:  General exam: Appears calm and comfortable  Respiratory system: Clear to auscultation. Respiratory effort normal. Cardiovascular system: S1 & S2 heard, RRR. No JVD, murmurs, rubs, gallops or clicks. No pedal edema. Gastrointestinal system: Abdomen is nondistended, soft and nontender. No organomegaly or masses felt. Normal bowel sounds heard. Central nervous system: Alert and oriented x 3. No focal neurological deficits. Extremities: Symmetric 5 x 5 power. Skin: No rashes, lesions or ulcers.   Data Reviewed: I have personally reviewed following labs and imaging studies  CBC: Recent Labs  Lab 02/13/24 1649 02/14/24 0423  02/14/24 1624 02/15/24 0624 02/16/24 0429  WBC 25.7* 19.1* 20.2* 23.2* 21.4*  NEUTROABS 20.5* 14.9* 15.7* 18.8* 17.1*  HGB 7.0* 6.8* 8.8* 9.5* 8.5*  HCT 21.5* 22.3* 28.1* 30.6* 26.9*  MCV 88.5 94.5 93.0 94.7 91.8  PLT 270 364 353 419* 387   Basic Metabolic Panel: Recent Labs  Lab 02/10/24 0956 02/11/24 0958 02/12/24 1033 02/13/24 0420 02/14/24 1624 02/15/24 0624 02/16/24 0429  NA 131* 137 136 139 137 138 138  K 4.7 4.1 3.5 3.7 4.0 4.2 4.0  CL 107 112* 110 115* 110 109 109  CO2 16* 16* 18* 16* 20* 19* 21*  GLUCOSE 144* 117* 119* 99 87 79 79  BUN 23 26* 21 18 22 21 18   CREATININE 1.39* 1.27* 1.08* 1.06* 1.48* 1.31* 1.01*  CALCIUM  9.0 9.3 9.8 9.5 9.3 9.6 9.4  MG 1.8 2.0 2.1 2.0  --   --   --   PHOS 3.5 3.4 2.6 2.3*  --   --   --    GFR: Estimated Creatinine Clearance: 36.2 mL/min (A) (by C-G formula based on SCr of 1.01 mg/dL (H)). Liver Function Tests: Recent Labs  Lab 02/12/24 1033 02/13/24 0420 02/14/24 1624 02/15/24 0624 02/16/24 0429  AST 148* 191* 242* 179* 75*  ALT 84* 131* 191* 187* 110*  ALKPHOS 178* 157* 171* 178* 146*  BILITOT 0.6 0.7 0.7 0.8 0.7  PROT 6.6 5.8* 5.9* 6.6 5.9*  ALBUMIN 2.2* 2.0* 1.9* 2.2* 1.9*   No results for input(s): LIPASE, AMYLASE in the last 168 hours. No results for input(s): AMMONIA in the last 168 hours. Coagulation Profile: No results for input(s): INR, PROTIME in the last 168 hours. Cardiac Enzymes: No results for input(s): CKTOTAL, CKMB, CKMBINDEX, TROPONINI in the last 168 hours. BNP (last 3 results) No results for input(s): PROBNP in the last 8760 hours. HbA1C: No results for input(s): HGBA1C in the last 72 hours. CBG: No results for input(s): GLUCAP in the last 168 hours. Lipid Profile: No results for input(s): CHOL, HDL, LDLCALC, TRIG, CHOLHDL, LDLDIRECT in the last 72 hours. Thyroid Function Tests: No results for input(s): TSH, T4TOTAL, FREET4, T3FREE, THYROIDAB in the  last 72 hours. Anemia Panel: No results for input(s): VITAMINB12, FOLATE, FERRITIN, TIBC, IRON, RETICCTPCT in the last 72 hours. Sepsis Labs: No results for input(s): PROCALCITON, LATICACIDVEN in the last 168 hours.  Recent Results (from the past 240 hours)  Urine Culture     Status: Abnormal   Collection Time: 02/09/24 12:21 AM   Specimen: In/Out Cath Urine  Result Value Ref Range Status   Specimen Description   Final    IN/OUT CATH URINE  Performed at [facility], [address] [place], [place] [address]    Special Requests   Final    NONE Performed at [facility], [address] [place], [place] [address]    Culture (A)  Final    60,000 COLONIES/mL KLEBSIELLA PNEUMONIAE Confirmed Extended Spectrum Beta-Lactamase Producer (ESBL).  In bloodstream infections from ESBL organisms, carbapenems are preferred over piperacillin/tazobactam. They are shown to have a lower risk of mortality. Two isolates with different morphologies were identified as the same organism.The most resistant organism was reported. Performed at [facility], [address] [place], [place] [address]    Report Status 02/12/2024 FINAL  Final   Organism ID, Bacteria KLEBSIELLA PNEUMONIAE (A)  Final      Susceptibility   Klebsiella pneumoniae - MIC*    AMPICILLIN >=32 RESISTANT Resistant     CEFAZOLIN  >=64 RESISTANT Resistant     CEFEPIME  >=32 RESISTANT Resistant     CEFTRIAXONE  >=64 RESISTANT Resistant     CIPROFLOXACIN <=0.25 SENSITIVE Sensitive     GENTAMICIN >=16 RESISTANT Resistant     IMIPENEM <=0.25 SENSITIVE Sensitive     NITROFURANTOIN 64 INTERMEDIATE Intermediate     TRIMETH/SULFA <=20 SENSITIVE Sensitive     AMPICILLIN/SULBACTAM 16 INTERMEDIATE Intermediate     PIP/TAZO <=4 SENSITIVE Sensitive ug/mL    * 60,000 COLONIES/mL KLEBSIELLA PNEUMONIAE  Urine Culture     Status: Abnormal   Collection Time: 02/09/24 12:21 AM   Specimen: Urine,  Random  Result Value Ref Range Status   Specimen Description   Final    URINE, RANDOM Performed at [facility], [address] [place], [place] [address]    Special Requests   Final    NONE Reflexed from [id number] Performed at [facility], [address], [place], [place] [address]    Culture (A)  Final    40,000 COLONIES/mL KLEBSIELLA PNEUMONIAE Confirmed Extended Spectrum Beta-Lactamase Producer (ESBL).  In bloodstream infections from ESBL organisms, carbapenems are preferred over piperacillin/tazobactam. They are shown to have a lower risk of mortality.    Report Status 02/12/2024 FINAL  Final   Organism ID, Bacteria KLEBSIELLA PNEUMONIAE (A)  Final      Susceptibility   Klebsiella pneumoniae - MIC*    AMPICILLIN >=32 RESISTANT Resistant     CEFAZOLIN  >=64 RESISTANT Resistant     CEFEPIME  >=32 RESISTANT Resistant     CEFTRIAXONE  >=64 RESISTANT Resistant     CIPROFLOXACIN 0.5 INTERMEDIATE Intermediate     GENTAMICIN >=16 RESISTANT Resistant     IMIPENEM <=0.25 SENSITIVE Sensitive     NITROFURANTOIN 64 INTERMEDIATE Intermediate     TRIMETH/SULFA <=20 SENSITIVE Sensitive     AMPICILLIN/SULBACTAM >=32 RESISTANT Resistant     PIP/TAZO <=4 SENSITIVE Sensitive ug/mL    * 40,000 COLONIES/mL KLEBSIELLA PNEUMONIAE  Culture, blood (Routine X 2) w Reflex to ID Panel     Status: None (Preliminary result)   Collection Time: 02/12/24  8:48 PM   Specimen: BLOOD  Result Value Ref Range Status   Specimen Description   Final    BLOOD BLOOD RIGHT ARM Performed at [facility], [address], [place], [place] [address]    Special Requests   Final    BOTTLES DRAWN AEROBIC ONLY Blood Culture results may not be optimal due to an inadequate volume of blood received in culture bottles Performed at [facility], [address], [place], [place] [address]    Culture  Final    NO GROWTH 4 DAYS Performed at [facility], [address] [place], [place] [address]    Report Status PENDING  Incomplete  Culture, blood (Routine X 2) w Reflex to ID Panel     Status: None (Preliminary result)   Collection Time: 02/12/24  9:05 PM   Specimen: BLOOD LEFT HAND  Result Value Ref Range Status   Specimen Description   Final    BLOOD LEFT HAND Performed at [facility], [address], [place], [place] [address]    Special Requests   Final    BOTTLES DRAWN AEROBIC ONLY Blood Culture results may not be optimal due to an inadequate volume of blood received in culture bottles Performed at [facility], [address], [place], [place] [address]    Culture   Final    NO GROWTH 4 DAYS Performed at [facility], [address] [place], [place] [address]    Report Status PENDING  Incomplete     Radiology Studies: No results found.   Scheduled Meds:  benztropine   1 mg Oral BID   Chlorhexidine  Gluconate Cloth  6 each Topical Daily   fluPHENAZine   20 mg Oral BID   lithium  carbonate  150 mg Oral QHS   LORazepam   0.5 mg Oral QID   lurasidone   120 mg Oral Daily   sertraline   150 mg Oral Daily   sodium bicarbonate   650 mg Oral BID   Continuous Infusions:  meropenem  (MERREM ) IV 1 g (02/15/24 2100)   sodium chloride  irrigation 0 mL (02/10/24 0715)     LOS: 6 days   Fredia Skeeter, MD [facility]  02/16/2024, 10:18 AM   *Please note that this is a verbal dictation therefore any spelling or grammatical errors are due to the Dragon Medical One system interpretation.  Please page via Amion and do not message via secure chat for urgent patient care matters. Secure chat can be used for non urgent patient care matters.  How to contact the [facility] Attending or Consulting provider 7A - 7P or covering provider during after hours 7P -7A, for this patient?  Check the care team in [facility] and look for a) attending/consulting [facility] provider listed and b) the [facility] team listed. Page or  secure chat 7A-7P. Log into [email or web address] and use [facility] universal password to access. If you do not have the password, please contact the hospital operator. Locate the [facility] provider you are looking for under [facility] and page to a number that you can be directly reached. If you still have difficulty reaching the provider, please page the [employer] (Director on Call) for the Hospitalists listed on amion for assistance.

## 2024-02-16 NOTE — TOC Progression Note (Signed)
 Transition of Care [facility]) - Progression Note   Patient Details  Name: ABBE BULA MRN: [id number] Date of Birth: [birth year]  Transition of Care [facility]) CM/SW Contact  Duwaine GORMAN Aran, LCSW Phone Number: 02/16/2024, 9:01 AM  Clinical Narrative: Clinicals uploaded to [facility] MUST for review. TOC awaiting PASRR number.  Expected Discharge Plan: Skilled Nursing Facility Barriers to Discharge: English as a second language teacher, SNF Pending bed offer, Awaiting State Approval (PASRR)  Expected Discharge Plan and Services In-house Referral: Clinical Social Work Post Acute Care Choice: Skilled Nursing Facility Living arrangements for the past 2 months: Assisted Living Facility Expected Discharge Date: 02/15/24               DME Arranged: N/A DME Agency: NA  Social Determinants of Health (SDOH) Interventions SDOH Screenings   Food Insecurity: No Food Insecurity (02/10/2024)  Housing: Low Risk  (02/10/2024)  Transportation Needs: No Transportation Needs (02/10/2024)  Utilities: Not At Risk (02/10/2024)  Social Connections: Moderately Integrated (02/10/2024)  Tobacco Use: High Risk (02/09/2024)   Readmission Risk Interventions    09/24/2023   12:58 PM  Readmission Risk Prevention Plan  Post Dischage Appt Complete  Medication Screening Complete  Transportation Screening Complete

## 2024-02-16 NOTE — Progress Notes (Signed)
 AT bedside for PICC placement.  No family at bedside and no family answering phone numbers provided.  Gustavo RN aware, will obtain consent if they return.  Pt has adequate PIV access for today.

## 2024-02-17 DIAGNOSIS — N320 Bladder-neck obstruction: Secondary | ICD-10-CM | POA: Diagnosis not present

## 2024-02-17 LAB — CULTURE, BLOOD (ROUTINE X 2)
Culture: NO GROWTH
Culture: NO GROWTH

## 2024-02-17 LAB — CBC WITH DIFFERENTIAL/PLATELET
Abs Immature Granulocytes: 0.56 10*3/uL — ABNORMAL HIGH (ref 0.00–0.07)
Basophils Absolute: 0.1 10*3/uL (ref 0.0–0.1)
Basophils Relative: 1 %
Eosinophils Absolute: 0.2 10*3/uL (ref 0.0–0.5)
Eosinophils Relative: 1 %
HCT: 28 % — ABNORMAL LOW (ref 36.0–46.0)
Hemoglobin: 8.8 g/dL — ABNORMAL LOW (ref 12.0–15.0)
Immature Granulocytes: 3 %
Lymphocytes Relative: 12 %
Lymphs Abs: 2.3 10*3/uL (ref 0.7–4.0)
MCH: 29.1 pg (ref 26.0–34.0)
MCHC: 31.4 g/dL (ref 30.0–36.0)
MCV: 92.7 fL (ref 80.0–100.0)
Monocytes Absolute: 1.3 10*3/uL — ABNORMAL HIGH (ref 0.1–1.0)
Monocytes Relative: 7 %
Neutro Abs: 14.8 10*3/uL — ABNORMAL HIGH (ref 1.7–7.7)
Neutrophils Relative %: 76 %
Platelets: 429 10*3/uL — ABNORMAL HIGH (ref 150–400)
RBC: 3.02 MIL/uL — ABNORMAL LOW (ref 3.87–5.11)
RDW: 14.9 % (ref 11.5–15.5)
WBC: 19.2 10*3/uL — ABNORMAL HIGH (ref 4.0–10.5)
nRBC: 0 % (ref 0.0–0.2)

## 2024-02-17 LAB — BASIC METABOLIC PANEL WITH GFR
Anion gap: 5 (ref 5–15)
BUN: 16 mg/dL (ref 8–23)
CO2: 22 mmol/L (ref 22–32)
Calcium: 9.5 mg/dL (ref 8.9–10.3)
Chloride: 112 mmol/L — ABNORMAL HIGH (ref 98–111)
Creatinine, Ser: 1.05 mg/dL — ABNORMAL HIGH (ref 0.44–1.00)
GFR, Estimated: 56 mL/min — ABNORMAL LOW (ref >60)
Glucose, Bld: 102 mg/dL — ABNORMAL HIGH (ref 70–99)
Potassium: 4.1 mmol/L (ref 3.5–5.1)
Sodium: 139 mmol/L (ref 135–145)

## 2024-02-17 MED ORDER — SODIUM CHLORIDE 0.9% FLUSH: 10.0000 mL | INTRAVENOUS | Status: DC | PRN

## 2024-02-17 NOTE — Progress Notes (Signed)
 Peripherally Inserted Central Catheter Placement  The IV Nurse has discussed with the patient and/or persons authorized to consent for the patient, the purpose of this procedure and the potential benefits and risks involved with this procedure.  The benefits include less needle sticks, lab draws from the catheter, and the patient may be discharged home with the catheter. Risks include, but not limited to, infection, bleeding, blood clot (thrombus formation), and puncture of an artery; nerve damage and irregular heartbeat and possibility to perform a PICC exchange if needed/ordered by physician.  Alternatives to this procedure were also discussed.  Bard Power PICC patient education guide, fact sheet on infection prevention and patient information card has been provided to patient /or left at bedside. Obtained consent from son.    PICC Placement Documentation  PICC Single Lumen 02/17/24 Right Brachial 36 cm 1 cm (Active)  Indication for Insertion or Continuance of Line Home intravenous therapies (PICC only) 02/17/24 1232  Exposed Catheter (cm) 1 cm 02/17/24 1232  Site Assessment Clean, Dry, Intact 02/17/24 1232  Line Status Flushed;Saline locked;Blood return noted 02/17/24 1232  Dressing Type Transparent;Securing device 02/17/24 1232  Dressing Status Antimicrobial disc/dressing in place;Clean, Dry, Intact 02/17/24 1232  Line Care Connections checked and tightened 02/17/24 1232  Line Adjustment (NICU/IV Team Only) No 02/17/24 1232  Dressing Intervention New dressing;Adhesive placed at insertion site (IV team only) 02/17/24 1232  Dressing Change Due 02/24/24 02/17/24 1232       Masen Salvas Sheral Ruth 02/17/2024, 12:36 PM

## 2024-02-17 NOTE — Progress Notes (Signed)
 PROGRESS NOTE    Heidi Blevins  FMW:[id number] DOB: 03/27/1951 DOA: 02/09/2024 PCP: Pcp, No   Brief Narrative:  The patient is a 73 year old African-American female with past medical history significant for Mannam to essential hypertension, multiple UTIs, history of CVA, history of schizoaffective disorder, depression and anxiety, insomnia, CKD stage IIIa, chronic diastolic CHF, history of left renal mass with anemia and other comorbidities who presented to the ED for evaluation of lower abdominal pain, vaginal itching and pain as well as hematuria.  She started having abdominal pain last night and had some dysuria, hematuria and vaginal itching and had some associated nausea, fevers and chills.  Further workup was done and UA was too bloody to report adequately but showed greater than 50 WBCs.  CT of the abdomen pelvis was done and showed distended bladder with hemorrhage and mild bilateral hydroureteronephrosis.  Received IM Cogentin  1 mg, fentanyl , Zofran , Rocephin  and urology was consulted and she was placed on IV fluid hydration.   Since last night her blood count has dropped and she has been to be typed and screened and transfused 2 units of PRBCs.  After the 2 units of blood count is improved and is stable at 8.4.  She continues to have a metabolic acidosis and WBC remains elevated. 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 Found to have abdominal distention, lower abdominal pain and gross hematuria on admission - CT A/P shows distended bladder with hemorrhage as well as mild bilateral hydroureteronephrosis extending to the UVJ - Urology  consulted, recommended three-way Foley catheter and bladder irrigation, this was completed by EDP; Foley catheter was removed several days ago.   Acute Cystitis versus Pyelonephritis urine culture growing ESBL Klebsiella so antibiotics switched from Rocephin  to meropenem .  ID consulted, based on the sensitivities, patient could only get Bactrim DS however patient is allergic to sulfa so we do not have any oral antibiotics options.  ID recommended continuing ertapenem  until 02/25/2024.  Reportedly a midline was placed on 02/15/2024 however there was accidentally pulled out.  IV team was consulted again on 02/16/2024 but PICC line was not inserted since they could not reach out to the family for consent.  Currently she has adequate peripheral line.  Son at the bedside tells me that he has signed the consent today.   Left Renal Mass: Patient found to have a 2.6 x 3.0 cm left renal mass on imaging earlier this year. Followed by oncology and urology.  Urology following, appreciate recs and she has follow-up imaging planned next month;    Acute Blood Loss Anemia superimposed on Chronic Anemia secondary to gross hematuria - Followed by oncology in outpatient setting and underwent Bone Marrow Bx where pathology did not indicate underlying bone marrow disorder; Has undergone extensive workup for Anemia and it was felt her Anemia was an Inflammatory Process - Hgb of 8.5 on admission, dropped to 6.3 on 02/10/2024 for which she received 2 units of.  Recent transfusion, hemoglobin over 8 posttransfusion but dropped to 7.4.  Urology is now suspecting bleeding from the kidney, CT renal with contrast ordered by urology 02/13/2024 did not show any source of extravasation/bleeding.  Patient's hemoglobin  once again dropped to 6.8 early morning on 02/14/2024 and 1 unit of PRBC transfusion is ordered.  Hemoglobin has remained over 8 since then.  Monitor daily.  AKI on CKD Stage 3a/ Metabolic Acidosis: Baseline creatinine between  0.9-1.05.  Creatinine peaked at 1.4.  She received IV fluids.  Creatinine now down to 1.06 which is her baseline.   Essential HTN: BP initially elevated on admission, currently blood pressure is normal despite of holding antihypertensives so we will continue to hold for now.    Anxiety and depression Schizoaffective disorder - Lithium  level subtherapeutic at 0.14 - Continue lithium , benztropine , lurasidone , fluphenazine  - Continue sertraline  and lorazepam    Abnormal LFTs/ Elevated LFTs: Elevated on Admission and had normalize but then worsened again.  Discontinued atorvastatin  02/13/2024.  Avoid hepatotoxic agents.  LFTs have started to improve now.   Hx of CVA  HLD Plavix  on hold in the setting of active bleeding.  Hold statin as mentioned above.  Disposition: Patient was going to be discharged on 02/15/2024 after PICC line placement.  All the discharge paperwork was completed.  However, I was informed by TOC afterwards that patient's assisted living facility does not take patients with the PICC line and thus discharge was canceled.  TOC is pursuing SNF after talking to the patient and the family.  However I was informed by the [facility] that patient needs PASSR in order to proceed and that department is closed until Monday.  DVT prophylaxis: SCDs Start: 02/10/24 0225   Code Status: Limited: Do not attempt resuscitation (DNR) -DNR-LIMITED -Do Not Intubate/DNI   Family Communication:  None present at bedside.   Status is: Inpatient Remains inpatient appropriate because: Medically stable, pending placement.  Estimated body mass index is 21.44 kg/m as calculated from the following:   Height as of this encounter: 5' (1.524 m).   Weight as of this encounter: 49.8 kg.    Nutritional Assessment: Body mass index is 21.44 kg/m.SABRA Seen by dietician.  I agree with the assessment and plan as outlined below: Nutrition Status:        . Skin Assessment: I have examined the patient's skin and I  agree with the wound assessment as performed by the wound care RN as outlined below:    Consultants:  Urology  Procedures:  None  Antimicrobials:  Anti-infectives (From admission, onward)    Start     Dose/Rate Route Frequency Ordered Stop   02/15/24 0000  ertapenem  (INVANZ ) IVPB        500 mg Intravenous Every 24 hours 02/15/24 1159 02/25/24 2359   02/12/24 1400  meropenem  (MERREM ) 1 g in sodium chloride  0.9 % 100 mL IVPB        1 g 200 mL/hr over 30 Minutes Intravenous Every 12 hours 02/12/24 1254     02/10/24 2300  cefTRIAXone  (ROCEPHIN ) 1 g in sodium chloride  0.9 % 100 mL IVPB  Status:  Discontinued        1 g 200 mL/hr over 30 Minutes Intravenous Every 24 hours 02/10/24 0247 02/12/24 1254   02/09/24 2315  cefTRIAXone  (ROCEPHIN ) 2 g in sodium chloride  0.9 % 100 mL IVPB        2 g 200 mL/hr over 30 Minutes Intravenous  Once 02/09/24 2306 02/09/24 2358         Subjective: Patient seen and examined.  She has no complaints at all.  Son at the bedside.  Objective: Vitals:   02/16/24 0540 02/16/24 1141 02/16/24 2041 02/17/24 0400  BP: 132/82  132/83 (!) 147/85 129/75  Pulse: 81 86 78 77  Resp: 15 19  18   Temp: 98.8 F (37.1 C) (!) 97.4 F (36.3 C) 98 F (36.7 C) 98.6 F (37 C)  TempSrc:  Oral Axillary Oral  SpO2: 96% 98% 100% 96%  Weight:      Height:        Intake/Output Summary (Last 24 hours) at 02/17/2024 0839 Last data filed at 02/17/2024 0532 Gross per 24 hour  Intake 800 ml  Output 1600 ml  Net -800 ml   Filed Weights   02/09/24 1946 02/10/24 0356  Weight: 49.9 kg 49.8 kg    Examination:  General exam: Appears calm and comfortable  Respiratory system: Clear to auscultation. Respiratory effort normal. Cardiovascular system: S1 & S2 heard, RRR. No JVD, murmurs, rubs, gallops or clicks. No pedal edema. Gastrointestinal system: Abdomen is nondistended, soft and nontender. No organomegaly or masses felt. Normal bowel sounds heard. Central nervous  system: Alert and oriented x 3. No focal neurological deficits. Extremities: Symmetric 5 x 5 power. Skin: No rashes, lesions or ulcers.   Data Reviewed: I have personally reviewed following labs and imaging studies  CBC: Recent Labs  Lab 02/13/24 1649 02/14/24 0423 02/14/24 1624 02/15/24 0624 02/16/24 0429  WBC 25.7* 19.1* 20.2* 23.2* 21.4*  NEUTROABS 20.5* 14.9* 15.7* 18.8* 17.1*  HGB 7.0* 6.8* 8.8* 9.5* 8.5*  HCT 21.5* 22.3* 28.1* 30.6* 26.9*  MCV 88.5 94.5 93.0 94.7 91.8  PLT 270 364 353 419* 387   Basic Metabolic Panel: Recent Labs  Lab 02/10/24 0956 02/11/24 0958 02/12/24 1033 02/13/24 0420 02/14/24 1624 02/15/24 0624 02/16/24 0429  NA 131* 137 136 139 137 138 138  K 4.7 4.1 3.5 3.7 4.0 4.2 4.0  CL 107 112* 110 115* 110 109 109  CO2 16* 16* 18* 16* 20* 19* 21*  GLUCOSE 144* 117* 119* 99 87 79 79  BUN 23 26* 21 18 22 21 18   CREATININE 1.39* 1.27* 1.08* 1.06* 1.48* 1.31* 1.01*  CALCIUM  9.0 9.3 9.8 9.5 9.3 9.6 9.4  MG 1.8 2.0 2.1 2.0  --   --   --   PHOS 3.5 3.4 2.6 2.3*  --   --   --    GFR: Estimated Creatinine Clearance: 36.2 mL/min (A) (by C-G formula based on SCr of 1.01 mg/dL (H)). Liver Function Tests: Recent Labs  Lab 02/12/24 1033 02/13/24 0420 02/14/24 1624 02/15/24 0624 02/16/24 0429  AST 148* 191* 242* 179* 75*  ALT 84* 131* 191* 187* 110*  ALKPHOS 178* 157* 171* 178* 146*  BILITOT 0.6 0.7 0.7 0.8 0.7  PROT 6.6 5.8* 5.9* 6.6 5.9*  ALBUMIN 2.2* 2.0* 1.9* 2.2* 1.9*   No results for input(s): LIPASE, AMYLASE in the last 168 hours. No results for input(s): AMMONIA in the last 168 hours. Coagulation Profile: No results for input(s): INR, PROTIME in the last 168 hours. Cardiac Enzymes: No results for input(s): CKTOTAL, CKMB, CKMBINDEX, TROPONINI in the last 168 hours. BNP (last 3 results) No results for input(s): PROBNP in the last 8760 hours. HbA1C: No results for input(s): HGBA1C in the last 72 hours. CBG: No  results for input(s): GLUCAP in the last 168 hours. Lipid Profile: No results for input(s): CHOL, HDL, LDLCALC, TRIG, CHOLHDL, LDLDIRECT in the last 72 hours. Thyroid Function Tests: No results for input(s): TSH, T4TOTAL, FREET4, T3FREE, THYROIDAB in the last 72 hours. Anemia Panel: No results for input(s): VITAMINB12, FOLATE, FERRITIN, TIBC, IRON, RETICCTPCT in the last 72 hours.  Sepsis Labs: No results for input(s): PROCALCITON, LATICACIDVEN in the last 168 hours.  Recent Results (from the past 240 hours)  Urine Culture     Status: Abnormal   Collection Time: 02/09/24 12:21 AM   Specimen: In/Out Cath Urine  Result Value Ref Range Status   Specimen Description   Final    IN/OUT CATH URINE Performed at [facility], [address], [place], [place] [address]    Special Requests   Final    NONE Performed at [facility], [address] [place], [place] [address]    Culture (A)  Final    60,000 COLONIES/mL KLEBSIELLA PNEUMONIAE Confirmed Extended Spectrum Beta-Lactamase Producer (ESBL).  In bloodstream infections from ESBL organisms, carbapenems are preferred over piperacillin/tazobactam. They are shown to have a lower risk of mortality. Two isolates with different morphologies were identified as the same organism.The most resistant organism was reported. Performed at [facility], [address] [place], [place] [address]    Report Status 02/12/2024 FINAL  Final   Organism ID, Bacteria KLEBSIELLA PNEUMONIAE (A)  Final      Susceptibility   Klebsiella pneumoniae - MIC*    AMPICILLIN >=32 RESISTANT Resistant     CEFAZOLIN  >=64 RESISTANT Resistant     CEFEPIME  >=32 RESISTANT Resistant     CEFTRIAXONE  >=64 RESISTANT Resistant     CIPROFLOXACIN <=0.25 SENSITIVE Sensitive     GENTAMICIN >=16 RESISTANT Resistant     IMIPENEM <=0.25 SENSITIVE Sensitive     NITROFURANTOIN 64 INTERMEDIATE Intermediate      TRIMETH/SULFA <=20 SENSITIVE Sensitive     AMPICILLIN/SULBACTAM 16 INTERMEDIATE Intermediate     PIP/TAZO <=4 SENSITIVE Sensitive ug/mL    * 60,000 COLONIES/mL KLEBSIELLA PNEUMONIAE  Urine Culture     Status: Abnormal   Collection Time: 02/09/24 12:21 AM   Specimen: Urine, Random  Result Value Ref Range Status   Specimen Description   Final    URINE, RANDOM Performed at [facility], [address], [place], [place] [address]    Special Requests   Final    NONE Reflexed from [id number] Performed at [facility], [address], [place], [place] [address]    Culture (A)  Final    40,000 COLONIES/mL KLEBSIELLA PNEUMONIAE Confirmed Extended Spectrum Beta-Lactamase Producer (ESBL).  In bloodstream infections from ESBL organisms, carbapenems are preferred over piperacillin/tazobactam. They are shown to have a lower risk of mortality.    Report Status 02/12/2024 FINAL  Final   Organism ID, Bacteria KLEBSIELLA PNEUMONIAE (A)  Final      Susceptibility   Klebsiella pneumoniae - MIC*    AMPICILLIN >=32 RESISTANT Resistant     CEFAZOLIN  >=64 RESISTANT Resistant     CEFEPIME  >=32 RESISTANT Resistant     CEFTRIAXONE  >=64 RESISTANT Resistant     CIPROFLOXACIN 0.5 INTERMEDIATE Intermediate     GENTAMICIN >=16 RESISTANT Resistant     IMIPENEM <=0.25 SENSITIVE Sensitive     NITROFURANTOIN 64 INTERMEDIATE Intermediate     TRIMETH/SULFA <=20 SENSITIVE Sensitive     AMPICILLIN/SULBACTAM >=32 RESISTANT Resistant     PIP/TAZO <=4 SENSITIVE Sensitive ug/mL    * 40,000 COLONIES/mL KLEBSIELLA PNEUMONIAE  Culture, blood (Routine X 2) w Reflex to ID Panel     Status: None   Collection Time: 02/12/24  8:48 PM   Specimen: BLOOD  Result Value Ref Range Status   Specimen Description   Final    BLOOD BLOOD RIGHT ARM Performed at [facility], [address] [place], [place] [address]    Special Requests   Final    BOTTLES DRAWN AEROBIC ONLY  Blood Culture results may not be optimal due to an inadequate volume of blood received in culture bottles Performed at [facility], [address] [place], [place] [address]    Culture   Final    NO GROWTH 5 DAYS Performed at [facility], [address], [place], [place] [address]    Report Status 02/17/2024 FINAL  Final  Culture, blood (Routine X 2) w Reflex to ID Panel     Status: None   Collection Time: 02/12/24  9:05 PM   Specimen: BLOOD LEFT HAND  Result Value Ref Range Status   Specimen Description   Final    BLOOD LEFT HAND Performed at [facility], [address] [place], [place] [address]    Special Requests   Final    BOTTLES DRAWN AEROBIC ONLY Blood Culture results may not be optimal due to an inadequate volume of blood received in culture bottles Performed at [facility], [address], [place], [place] [address]    Culture   Final    NO GROWTH 5 DAYS Performed at [facility], [address], [place], [place] [address]    Report Status 02/17/2024 FINAL  Final     Radiology Studies: US  EKG SITE RITE Result Date: 02/16/2024 If Site Rite image not attached, placement could not be confirmed due to current cardiac rhythm.    Scheduled Meds:  benztropine   1 mg Oral BID   Chlorhexidine  Gluconate Cloth  6 each Topical Daily   fluPHENAZine   20 mg Oral BID   lithium  carbonate  150 mg Oral QHS   LORazepam   0.5 mg Oral QID   lurasidone   120 mg Oral Daily   nystatin   5 mL Oral QID   sertraline   150 mg Oral Daily   sodium bicarbonate   650 mg Oral BID   Continuous Infusions:  meropenem  (MERREM ) IV 1 g (02/16/24 2142)   sodium chloride  irrigation 0 mL (02/10/24 0715)     LOS: 7 days   Fredia Skeeter, MD [facility]  02/17/2024, 8:39 AM   *Please note that this is a verbal dictation therefore any spelling or grammatical errors are due to the Dragon Medical One system interpretation.  Please page  via Amion and do not message via secure chat for urgent patient care matters. Secure chat can be used for non urgent patient care matters.  How to contact the [facility] Attending or Consulting provider 7A - 7P or covering provider during after hours 7P -7A, for this patient?  Check the care team in [facility] and look for a) attending/consulting TRH provider listed and b) the TR[facility] team listed. Page or secure chat 7A-7P. Log into [email or web address] and use [facility]'s universal password to access. If you do not have the password, please contact the hospital operator. Locate the [facility] provider you are looking for under [facility] and page to a number that you can be directly reached. If you still have difficulty reaching the provider, please page the [facility] (Director on Call) for the Hospitalists listed on amion for assistance.

## 2024-02-18 DIAGNOSIS — N320 Bladder-neck obstruction: Secondary | ICD-10-CM | POA: Diagnosis not present

## 2024-02-18 NOTE — TOC Progression Note (Addendum)
 Transition of Care [facility]) - Progression Note    Patient Details  Name: Heidi Blevins MRN: [id number] Date of Birth: 10-01-1950  Transition of [facility]) CM/SW Contact  Heather DELENA Saltness, LCSW Phone Number: 02/18/2024, 2:09 PM  Clinical Narrative:     [employer] number received, [phone] E. CSW will begin insurance authorization, pending updated PT note.  CSW spoke with pt's son, [name] [phone], to discuss SNF bed offers and offer bed choice. CSW provided list of facilities with available beds, including name of facility, location, and Medicare Star-Ratings. [name] chooses bed at [facility]. CSW will request insurance authorization once PASRR number is provided. TOC will continue to follow.  Medicare Star-Ratings  [facility] [address] [place], [place] [address] [phone] Overall rating ??  [facility] [address] [place], [place] [address] [phone] Overall rating?  [facility] [address] [place], [place] [address] [phone] Overall rating ? Much below average  [facility] [address] [place], [place] [address] [phone] Overall rating ?? Much below average  [facility] [address] [place], [place] [address] [phone] Overall rating ?????   Expected Discharge Plan: Skilled Nursing Facility Barriers to Discharge: English as a second language teacher, SNF Pending bed offer, Awaiting State Approval (PASRR)  Expected Discharge Plan and Services In-house Referral: Clinical Social Work   Post Acute Care Choice: Skilled Nursing Facility Living arrangements for the past 2 months: Assisted Living Facility Expected Discharge Date: 02/15/24               DME Arranged: N/A DME Agency: NA      Social Determinants of Health (SDOH) Interventions SDOH Screenings   Food Insecurity:  No Food Insecurity (02/10/2024)  Housing: Low Risk  (02/10/2024)  Transportation Needs: No Transportation Needs (02/10/2024)  Utilities: Not At Risk (02/10/2024)  Social Connections: Moderately Integrated (02/10/2024)  Tobacco Use: High Risk (02/09/2024)    Readmission Risk Interventions    09/24/2023   12:58 PM  Readmission Risk Prevention Plan  Post Dischage Appt Complete  Medication Screening Complete  Transportation Screening Complete    Heather Saltness, MSW, LCSW 02/18/2024 2:12 PM

## 2024-02-18 NOTE — Care Management Important Message (Signed)
 Important Message  Patient Details IM Letter given. Name: CARMELL ELGIN MRN: [id number] Date of Birth: 04/08/1951   Important Message Given:  Yes - Medicare IM     Melba Ates 02/18/2024, 10:09 AM

## 2024-02-18 NOTE — Progress Notes (Signed)
 Occupational Therapy Treatment Patient Details Name: MAXI CARRERAS MRN: [id number] DOB: 1951-08-22 Today's Date: 02/18/2024   History of present illness TUERE NWOSU is a 73 y.o. female with medical history significant for hypertension, multiple UTIs, CVA, schizoaffective disorder, depression, anxiety, insomnia, CKD 3A, chronic diastolic heart failure, left renal mass and anemia who presented to the ED for evaluation of lower abdominal pain, vaginal itching/pain and hematuria. Admitted to [facility] with bladder obstruction and acute cystitis   OT comments  Patient was sitting in chair position with nurse feeding patient upon entrance to room. Patient and nurse were educated on tray positioning to optimize patients ability to reach food on tray. Patient did need help at times to scoop items and would benefit from continued use of knife to provide barrier for patient to scoop/spear items onto utensils. Patient reported it was easier to hold the utensils with red foam in place. Patient provided with two to keep in room. Nurse demonstrated understanding. Patient will benefit from continued inpatient follow up therapy, <3 hours/day.       If plan is discharge home, recommend the following:  A lot of help with walking and/or transfers;A lot of help with bathing/dressing/bathroom;Assistance with cooking/housework;Assistance with feeding;Direct supervision/assist for medications management;Direct supervision/assist for financial management;Assist for transportation;Help with stairs or ramp for entrance;Supervision due to cognitive status         Precautions / Restrictions Precautions Precautions: Fall Recall of Precautions/Restrictions: Intact Restrictions Weight Bearing Restrictions Per Provider Order: No              ADL either performed or assessed with clinical judgement   ADL Overall ADL's : Needs assistance/impaired Eating/Feeding: Minimal assistance;Bed level Eating/Feeding  Details (indicate cue type and reason): with red foam provided. education on holding utensil close to fork prongs to have more control. patient was noted to be able to scoop/spear items if provided with boarder with knife. patient able to bring to mouth with no spillage. patients nurse educated on positioning of tray to promote patients ability to reach items on tray. patietn verbalized that it was better after positioning was adjusted. patietn was able to pick up typical cup and take sips with no spillage. patient will need encoragement to engage in this task as patient is eager to just open mouth and have someone feed her. educaed on preventing learned helplessness. patient verbalized understanding.                                          Cognition Arousal: Alert Behavior During Therapy: WFL for tasks assessed/performed Cognition: Cognition impaired             OT - Cognition Comments: plesantly confused                                        Pertinent Vitals/ Pain       Pain Assessment Pain Assessment: Faces Faces Pain Scale: No hurt         Frequency  Min 2X/week        Progress Toward Goals  OT Goals(current goals can now be found in the care plan section)  Progress towards OT goals: Progressing toward goals     Plan         AM-PAC OT 6 Clicks Daily Activity  Outcome Measure   Help from another person eating meals?: A Little Help from another person taking care of personal grooming?: A Little Help from another person toileting, which includes using toliet, bedpan, or urinal?: A Lot Help from another person bathing (including washing, rinsing, drying)?: A Lot Help from another person to put on and taking off regular upper body clothing?: A Little Help from another person to put on and taking off regular lower body clothing?: A Lot 6 Click Score: 15    End of Session Equipment Utilized During Treatment: Other (comment)  (red foam)  OT Visit Diagnosis: Unsteadiness on feet (R26.81);Muscle weakness (generalized) (M62.81);History of falling (Z91.81)   Activity Tolerance Patient limited by fatigue   Patient Left in bed;with call bell/phone within reach;with bed alarm set   Nurse Communication Other (comment) (in room during session)        Time: 8694-8673 OT Time Calculation (min): 21 min  Charges: OT General Charges $OT Visit: 1 Visit OT Treatments $Self Care/Home Management : 8-22 mins  Geofm LEYLAND, MS Acute Rehabilitation Department Office# [phone]   Geofm CHRISTELLA Dance 02/18/2024, 1:36 PM

## 2024-02-18 NOTE — TOC Progression Note (Addendum)
 Transition of Care [facility]) - Progression Note    Patient Details  Name: Heidi Blevins MRN: [id number] Date of Birth: 04-Oct-1950  Transition of Care [facility]) CM/SW Contact  Heidi DELENA Saltness, LCSW Phone Number: 02/18/2024, 10:08 AM  Clinical Narrative:    ADDENDUM  12:50 PM - CSW attempted to speak with pt's son, Ocie Stanzione [phone], via phone call to discuss bed offers for SNF. No answe, voicemail left.  PASRR number still pending. TOC will continue to follow.   Expected Discharge Plan: Skilled Nursing Facility Barriers to Discharge: English as a second language teacher, SNF Pending bed offer, Awaiting State Approval (PASRR)  Expected Discharge Plan and Services In-house Referral: Clinical Social Work   Post Acute Care Choice: Skilled Nursing Facility Living arrangements for the past 2 months: Assisted Living Facility Expected Discharge Date: 02/15/24               DME Arranged: N/A DME Agency: NA                   Social Determinants of Health (SDOH) Interventions SDOH Screenings   Food Insecurity: No Food Insecurity (02/10/2024)  Housing: Low Risk  (02/10/2024)  Transportation Needs: No Transportation Needs (02/10/2024)  Utilities: Not At Risk (02/10/2024)  Social Connections: Moderately Integrated (02/10/2024)  Tobacco Use: High Risk (02/09/2024)    Readmission Risk Interventions    09/24/2023   12:58 PM  Readmission Risk Prevention Plan  Post Dischage Appt Complete  Medication Screening Complete  Transportation Screening Complete    Heidi Blevins, MSW, LCSW 02/18/2024 10:08 AM

## 2024-02-18 NOTE — Progress Notes (Signed)
 PROGRESS NOTE    Heidi Blevins  FMW:[id number] DOB: Dec 27, 1950 DOA: 02/09/2024 PCP: Heidi Blevins   Brief Narrative:  The patient is a 73 year old African-American female with past medical history significant for Mannam to essential hypertension, multiple UTIs, history of CVA, history of schizoaffective disorder, depression and anxiety, insomnia, CKD stage IIIa, chronic diastolic CHF, history of left renal mass with anemia and other comorbidities who presented to the ED for evaluation of lower abdominal pain, vaginal itching and pain as well as hematuria.  She started having abdominal pain last night and had some dysuria, hematuria and vaginal itching and had some associated nausea, fevers and chills.  Further workup was done and UA was too bloody to report adequately but showed greater than 50 WBCs.  CT of the abdomen pelvis was done and showed distended bladder with hemorrhage and mild bilateral hydroureteronephrosis.  Received IM Cogentin  1 mg, fentanyl , Zofran , Rocephin  and urology was consulted and she was placed on IV fluid hydration.   Since last night her blood count has dropped and she has been to be typed and screened and transfused 2 units of PRBCs.  After the 2 units of blood count is improved and is stable at 8.4.  She continues to have a metabolic acidosis and WBC remains elevated. 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 Found to have abdominal distention, lower abdominal pain and gross hematuria on admission - CT A/P shows distended bladder with hemorrhage as well as mild bilateral hydroureteronephrosis extending to the UVJ - Urology  consulted, recommended three-way Foley catheter and bladder irrigation, this was completed by EDP; Foley catheter was removed several days ago.   Acute Cystitis versus Pyelonephritis urine culture growing ESBL Klebsiella so antibiotics switched from Rocephin  to meropenem .  ID consulted, based on the sensitivities, patient could only get Bactrim DS however patient is allergic to sulfa so we do not have any oral antibiotics options.  ID recommended continuing ertapenem  until 02/25/2024.  Reportedly a midline was placed on 02/15/2024 however there was accidentally pulled out.  IV team was consulted again and PICC line was placed on 02/17/2024.  She will need IV ertapenem  through 02/25/2024.   Left Renal Mass: Patient found to have a 2.6 x 3.0 cm left renal mass on imaging earlier this year. Followed by oncology and urology.  Urology following, appreciate recs and she has follow-up imaging planned next month;    Acute Blood Loss Anemia superimposed on Chronic Anemia secondary to gross hematuria - Followed by oncology in outpatient setting and underwent Bone Marrow Bx where pathology did not indicate underlying bone marrow disorder; Has undergone extensive workup for Anemia and it was felt her Anemia was an Inflammatory Process - Hgb of 8.5 on admission, dropped to 6.3 on 02/10/2024 for which she received 2 units of.  Recent transfusion, hemoglobin over 8 posttransfusion but dropped to 7.4.  Urology is now suspecting bleeding from the kidney, CT renal with contrast ordered by urology 02/13/2024 did not show any source of extravasation/bleeding.  Patient's hemoglobin once again dropped to 6.8 early morning on 02/14/2024 and 1 unit of PRBC transfusion is ordered.  Hemoglobin has remained over 8 since then.  Monitor daily.  AKI on CKD Stage 3a/ Metabolic Acidosis: Baseline creatinine between 0.9-1.05.  Creatinine peaked at 1.4.  She received IV fluids.  Creatinine now down to 1.06 which is her baseline.   Essential  HTN: BP initially elevated on admission, currently blood pressure is normal despite of holding antihypertensives so we will continue to hold for now.    Anxiety and depression Schizoaffective disorder - Lithium  level subtherapeutic at 0.14 - Continue lithium , benztropine , lurasidone , fluphenazine  - Continue sertraline  and lorazepam    Abnormal LFTs/ Elevated LFTs: Elevated on Admission and had normalize but then worsened again.  Discontinued atorvastatin  02/13/2024.  Avoid hepatotoxic agents.  LFTs have started to improve now.   Hx of CVA  HLD Plavix  on hold in the setting of active bleeding.  Hold statin as mentioned above.  Disposition: Patient was going to be discharged on 02/15/2024 after PICC line placement.  All the discharge paperwork was completed.  However, I was informed by TOC afterwards that patient's assisted living facility does not take patients with the PICC line and thus discharge was canceled.  TOC is pursuing SNF after talking to the patient and the family.  However I was informed by the [facility] that patient needs PASSR in order to proceed and that department is closed until Monday.  Hoping that there will be some progress today.  DVT prophylaxis: SCDs Start: 02/10/24 0225   Code Status: Limited: Do not attempt resuscitation (DNR) -DNR-LIMITED -Do Not Intubate/DNI   Family Communication:  None present at bedside.   Status is: Inpatient Remains inpatient appropriate because: Medically stable, pending placement.  Estimated body mass index is 21.44 kg/m as calculated from the following:   Height as of this encounter: 5' (1.524 m).   Weight as of this encounter: 49.8 kg.    Nutritional Assessment: Body mass index is 21.44 kg/m.Heidi Blevins Seen by dietician.  I agree with the assessment and plan as outlined below: Nutrition Status:        . Skin Assessment: I have examined the patient's skin and I agree with the wound assessment as performed by the wound care RN as outlined  below:    Consultants:  Urology  Procedures:  None  Antimicrobials:  Anti-infectives (From admission, onward)    Start     Dose/Rate Route Frequency Ordered Stop   02/15/24 0000  ertapenem  (INVANZ ) IVPB        500 mg Intravenous Every 24 hours 02/15/24 1159 02/25/24 2359   02/12/24 1400  meropenem  (MERREM ) 1 g in sodium chloride  0.9 % 100 mL IVPB        1 g 200 mL/hr over 30 Minutes Intravenous Every 12 hours 02/12/24 1254     02/10/24 2300  cefTRIAXone  (ROCEPHIN ) 1 g in sodium chloride  0.9 % 100 mL IVPB  Status:  Discontinued        1 g 200 mL/hr over 30 Minutes Intravenous Every 24 hours 02/10/24 0247 02/12/24 1254   02/09/24 2315  cefTRIAXone  (ROCEPHIN ) 2 g in sodium chloride  0.9 % 100 mL IVPB        2 g 200 mL/hr over 30 Minutes Intravenous  Once 02/09/24 2306 02/09/24 2358         Subjective: Seen and examined.  Other than low back pain due to laying in the bed for long time, she has Blevins other complaint.  Blevins abdominal pain.  Objective: Vitals:   02/17/24 1258 02/17/24 1322 02/17/24 2006 02/18/24 0502  BP: 108/60  (!) 146/91 120/75  Pulse:  82  87 80  Resp: 18  16 18   Temp: 97.9 F (36.6 C) 97.8 F (36.6 C) 98.4 F (36.9 C) 97.8 F (36.6 C)  TempSrc: Oral Oral Oral Oral  SpO2: 97%  99% 95%  Weight:      Height:        Intake/Output Summary (Last 24 hours) at 02/18/2024 0959 Last data filed at 02/18/2024 0505 Gross per 24 hour  Intake 460 ml  Output 1450 ml  Net -990 ml   Filed Weights   02/09/24 1946 02/10/24 0356  Weight: 49.9 kg 49.8 kg    Examination:  General exam: Appears calm and comfortable  Respiratory system: Clear to auscultation. Respiratory effort normal. Cardiovascular system: S1 & S2 heard, RRR. Blevins JVD, murmurs, rubs, gallops or clicks. Blevins pedal edema. Gastrointestinal system: Abdomen is nondistended, soft and nontender. Blevins organomegaly or masses felt. Normal bowel sounds heard. Central nervous system: Alert and oriented. Blevins focal  neurological deficits. Extremities: Symmetric 5 x 5 power. Skin: Blevins rashes, lesions or ulcers.   Data Reviewed: I have personally reviewed following labs and imaging studies  CBC: Recent Labs  Lab 02/14/24 0423 02/14/24 1624 02/15/24 0624 02/16/24 0429 02/17/24 0941  WBC 19.1* 20.2* 23.2* 21.4* 19.2*  NEUTROABS 14.9* 15.7* 18.8* 17.1* 14.8*  HGB 6.8* 8.8* 9.5* 8.5* 8.8*  HCT 22.3* 28.1* 30.6* 26.9* 28.0*  MCV 94.5 93.0 94.7 91.8 92.7  PLT 364 353 419* 387 429*   Basic Metabolic Panel: Recent Labs  Lab 02/12/24 1033 02/13/24 0420 02/14/24 1624 02/15/24 0624 02/16/24 0429 02/17/24 0941  NA 136 139 137 138 138 139  K 3.5 3.7 4.0 4.2 4.0 4.1  CL 110 115* 110 109 109 112*  CO2 18* 16* 20* 19* 21* 22  GLUCOSE 119* 99 87 79 79 102*  BUN 21 18 22 21 18 16   CREATININE 1.08* 1.06* 1.48* 1.31* 1.01* 1.05*  CALCIUM  9.8 9.5 9.3 9.6 9.4 9.5  MG 2.1 2.0  --   --   --   --   PHOS 2.6 2.3*  --   --   --   --    GFR: Estimated Creatinine Clearance: 34.8 mL/min (A) (by C-G formula based on SCr of 1.05 mg/dL (H)). Liver Function Tests: Recent Labs  Lab 02/12/24 1033 02/13/24 0420 02/14/24 1624 02/15/24 0624 02/16/24 0429  AST 148* 191* 242* 179* 75*  ALT 84* 131* 191* 187* 110*  ALKPHOS 178* 157* 171* 178* 146*  BILITOT 0.6 0.7 0.7 0.8 0.7  PROT 6.6 5.8* 5.9* 6.6 5.9*  ALBUMIN 2.2* 2.0* 1.9* 2.2* 1.9*   Blevins results for input(s): LIPASE, AMYLASE in the last 168 hours. Blevins results for input(s): AMMONIA in the last 168 hours. Coagulation Profile: Blevins results for input(s): INR, PROTIME in the last 168 hours. Cardiac Enzymes: Blevins results for input(s): CKTOTAL, CKMB, CKMBINDEX, TROPONINI in the last 168 hours. BNP (last 3 results) Blevins results for input(s): PROBNP in the last 8760 hours. HbA1C: Blevins results for input(s): HGBA1C in the last 72 hours. CBG: Blevins results for input(s): GLUCAP in the last 168 hours. Lipid Profile: Blevins results for input(s): CHOL,  HDL, LDLCALC, TRIG, CHOLHDL, LDLDIRECT in the last 72 hours. Thyroid Function Tests: Blevins results for input(s): TSH, T4TOTAL, FREET4, T3FREE, THYROIDAB in the last 72 hours. Anemia Panel: Blevins results for input(s): VITAMINB12, FOLATE, FERRITIN, TIBC, IRON, RETICCTPCT in the last 72 hours. Sepsis Labs: Blevins results for input(s): PROCALCITON, LATICACIDVEN in the last 168 hours.  Recent Results (from  the past 240 hours)  Urine Culture     Status: Abnormal   Collection Time: 02/09/24 12:21 AM   Specimen: In/Out Cath Urine  Result Value Ref Range Status   Specimen Description   Final    IN/OUT CATH URINE Performed at [facility], [address], [place], [place] [address]    Special Requests   Final    NONE Performed at [facility], [address], [place], [place] [address]    Culture (A)  Final    60,000 COLONIES/mL KLEBSIELLA PNEUMONIAE Confirmed Extended Spectrum Beta-Lactamase Producer (ESBL).  In bloodstream infections from ESBL organisms, carbapenems are preferred over piperacillin/tazobactam. They are shown to have a lower risk of mortality. Two isolates with different morphologies were identified as the same organism.The most resistant organism was reported. Performed at [facility], [address], [place], [place] [address]    Report Status 02/12/2024 FINAL  Final   Organism ID, Bacteria KLEBSIELLA PNEUMONIAE (A)  Final      Susceptibility   Klebsiella pneumoniae - MIC*    AMPICILLIN >=32 RESISTANT Resistant     CEFAZOLIN  >=64 RESISTANT Resistant     CEFEPIME  >=32 RESISTANT Resistant     CEFTRIAXONE  >=64 RESISTANT Resistant     CIPROFLOXACIN <=0.25 SENSITIVE Sensitive     GENTAMICIN >=16 RESISTANT Resistant     IMIPENEM <=0.25 SENSITIVE Sensitive     NITROFURANTOIN 64 INTERMEDIATE Intermediate     TRIMETH/SULFA <=20 SENSITIVE Sensitive     AMPICILLIN/SULBACTAM 16 INTERMEDIATE Intermediate      PIP/TAZO <=4 SENSITIVE Sensitive ug/mL    * 60,000 COLONIES/mL KLEBSIELLA PNEUMONIAE  Urine Culture     Status: Abnormal   Collection Time: 02/09/24 12:21 AM   Specimen: Urine, Random  Result Value Ref Range Status   Specimen Description   Final    URINE, RANDOM Performed at [facility], [address], [place], [place] [address]    Special Requests   Final    NONE Reflexed from [id number] Performed at [facility], [address] [place], [place] [address]    Culture (A)  Final    40,000 COLONIES/mL KLEBSIELLA PNEUMONIAE Confirmed Extended Spectrum Beta-Lactamase Producer (ESBL).  In bloodstream infections from ESBL organisms, carbapenems are preferred over piperacillin/tazobactam. They are shown to have a lower risk of mortality.    Report Status 02/12/2024 FINAL  Final   Organism ID, Bacteria KLEBSIELLA PNEUMONIAE (A)  Final      Susceptibility   Klebsiella pneumoniae - MIC*    AMPICILLIN >=32 RESISTANT Resistant     CEFAZOLIN  >=64 RESISTANT Resistant     CEFEPIME  >=32 RESISTANT Resistant     CEFTRIAXONE  >=64 RESISTANT Resistant     CIPROFLOXACIN 0.5 INTERMEDIATE Intermediate     GENTAMICIN >=16 RESISTANT Resistant     IMIPENEM <=0.25 SENSITIVE Sensitive     NITROFURANTOIN 64 INTERMEDIATE Intermediate     TRIMETH/SULFA <=20 SENSITIVE Sensitive     AMPICILLIN/SULBACTAM >=32 RESISTANT Resistant     PIP/TAZO <=4 SENSITIVE Sensitive ug/mL    * 40,000 COLONIES/mL KLEBSIELLA PNEUMONIAE  Culture, blood (Routine X 2) w Reflex to ID Panel     Status: None   Collection Time: 02/12/24  8:48 PM   Specimen: BLOOD  Result Value Ref Range Status   Specimen Description   Final    BLOOD BLOOD RIGHT ARM Performed at [facility], [address], [place], [place] [address]    Special Requests  Final    BOTTLES DRAWN AEROBIC ONLY Blood Culture results may not be optimal due to an inadequate volume of blood received in culture  bottles Performed at [facility], [address] [place], [place] [address]    Culture   Final    Blevins GROWTH 5 DAYS Performed at [facility], [address], [place], [place] [address]    Report Status 02/17/2024 FINAL  Final  Culture, blood (Routine X 2) w Reflex to ID Panel     Status: None   Collection Time: 02/12/24  9:05 PM   Specimen: BLOOD LEFT HAND  Result Value Ref Range Status   Specimen Description   Final    BLOOD LEFT HAND Performed at [facility], [address], [place], [place] [address]    Special Requests   Final    BOTTLES DRAWN AEROBIC ONLY Blood Culture results may not be optimal due to an inadequate volume of blood received in culture bottles Performed at [facility], [address] [place], [place] [address]    Culture   Final    Blevins GROWTH 5 DAYS Performed at [facility], [address], [place], [place] [address]    Report Status 02/17/2024 FINAL  Final     Radiology Studies: US  EKG SITE RITE Result Date: 02/16/2024 If Site Rite image not attached, placement could not be confirmed due to current cardiac rhythm.    Scheduled Meds:  benztropine   1 mg Oral BID   Chlorhexidine  Gluconate Cloth  6 each Topical Daily   fluPHENAZine   20 mg Oral BID   lithium  carbonate  150 mg Oral QHS   LORazepam   0.5 mg Oral QID   lurasidone   120 mg Oral Daily   nystatin   5 mL Oral QID   sertraline   150 mg Oral Daily   sodium bicarbonate   650 mg Oral BID   Continuous Infusions:  meropenem  (MERREM ) IV 1 g (02/18/24 0926)   sodium chloride  irrigation 0 mL (02/10/24 0715)     LOS: 8 days   Fredia Skeeter, MD [facility]  02/18/2024, 9:59 AM   *Please note that this is a verbal dictation therefore any spelling or grammatical errors are due to the Dragon Medical One system interpretation.  Please page via Amion and do not message via secure chat for urgent patient care matters. Secure chat can be  used for non urgent patient care matters.  How to contact the [facility] Attending or Consulting provider 7A - 7P or covering provider during after hours 7P -7A, for this patient?  Check the care team in [facility] and look for a) attending/consulting [facility] provider listed and b) the [facility] team listed. Page or secure chat 7A-7P. Log into [email or web address] and use [facility] universal password to access. If you do not have the password, please contact the hospital operator. Locate the [facility] provider you are looking for under [facility] and page to a number that you can be directly reached. If you still have difficulty reaching the provider, please page the [facility] (Director on Call) for the Hospitalists listed on amion for assistance.

## 2024-02-18 NOTE — Progress Notes (Signed)
 Physical Therapy Treatment Patient Details Name: Heidi Blevins MRN: [id number] DOB: 1951-03-12 Today's Date: 02/18/2024   History of Present Illness Heidi Blevins is a 73 y.o. female with medical history significant for hypertension, multiple UTIs, CVA, schizoaffective disorder, depression, anxiety, insomnia, CKD 3A, chronic diastolic heart failure, left renal mass and anemia who presented to the ED for evaluation of lower abdominal pain, vaginal itching/pain and hematuria. Admitted to [facility] with bladder obstruction and acute cystitis    PT Comments  The patient is quiet, asking for a lot to drink.  Patient  moves to sitting with no assistance, Stands and steps to recliner using the RW, steady min assistance.  Per TOC, patient will DC to SNF. Patient will benefit from continued inpatient follow up therapy, <3 hours/day    If plan is discharge home, recommend the following: A little help with bathing/dressing/bathroom;Two people to help with walking and/or transfers   Can travel by private vehicle        Equipment Recommendations  None recommended by PT    Recommendations for Other Services       Precautions / Restrictions Precautions Precaution/Restrictions Comments: bladder incontinence Restrictions Weight Bearing Restrictions Per Provider Order: No     Mobility  Bed Mobility   Bed Mobility: Supine to Sit     Supine to sit: Contact guard     General bed mobility comments: able to scoot self to bed edge with cues    Transfers Overall transfer level: Needs assistance Equipment used: Rolling walker (2 wheels) Transfers: Sit to/from Stand, Bed to chair/wheelchair/BSC Sit to Stand: Min assist   Step pivot transfers: Min assist       General transfer comment: increased time and cues this session for hand placement , able to sep to recliner and stand up again to repsition    Ambulation/Gait                   Stairs             Wheelchair  Mobility     Tilt Bed    Modified Rankin (Stroke Patients Only)       Balance Overall balance assessment: Needs assistance Sitting-balance support: Feet supported Sitting balance-Leahy Scale: Fair     Standing balance support: Reliant on assistive device for balance, During functional activity, Bilateral upper extremity supported Standing balance-Leahy Scale: Poor                              Communication Communication Communication: No apparent difficulties  Cognition Arousal: Alert Behavior During Therapy: WFL for tasks assessed/performed   PT - Cognitive impairments: Difficult to assess                         Following commands: Intact      Cueing Cueing Techniques: Verbal cues  Exercises      General Comments        Pertinent Vitals/Pain Pain Assessment Faces Pain Scale: No hurt    Home Living                          Prior Function            PT Goals (current goals can now be found in the care plan section) Progress towards PT goals: Progressing toward goals    Frequency    Min 2X/week  PT Plan      Co-evaluation              AM-PAC PT 6 Clicks Mobility   Outcome Measure  Help needed turning from your back to your side while in a flat bed without using bedrails?: A Little Help needed moving from lying on your back to sitting on the side of a flat bed without using bedrails?: A Little Help needed moving to and from a bed to a chair (including a wheelchair)?: A Lot Help needed standing up from a chair using your arms (e.g., wheelchair or bedside chair)?: A Lot Help needed to walk in hospital room?: Total Help needed climbing 3-5 steps with a railing? : Total 6 Click Score: 12    End of Session Equipment Utilized During Treatment: Gait belt Activity Tolerance: Patient tolerated treatment well Patient left: in chair;with call bell/phone within reach;with chair alarm set Nurse  Communication: Mobility status PT Visit Diagnosis: Unsteadiness on feet (R26.81);Muscle weakness (generalized) (M62.81);Pain     Time: 1455-1526 PT Time Calculation (min) (ACUTE ONLY): 31 min  Charges:    $Therapeutic Activity: 23-37 mins PT General Charges $$ ACUTE PT VISIT: 1 Visit                    Darice Potters PT Acute Rehabilitation Services Office [phone]    Potters Darice Norris 02/18/2024, 3:36 PM

## 2024-02-19 DIAGNOSIS — N320 Bladder-neck obstruction: Secondary | ICD-10-CM | POA: Diagnosis not present

## 2024-02-19 NOTE — Progress Notes (Signed)
 PROGRESS NOTE    Heidi Blevins  FMW:[id number] DOB: 1950/11/28 DOA: 02/09/2024 PCP: Pcp, No   Brief Narrative:  The patient is a 73 year old African-American female with past medical history significant for Mannam to essential hypertension, multiple UTIs, history of CVA, history of schizoaffective disorder, depression and anxiety, insomnia, CKD stage IIIa, chronic diastolic CHF, history of left renal mass with anemia and other comorbidities who presented to the ED for evaluation of lower abdominal pain, vaginal itching and pain as well as hematuria.  She started having abdominal pain last night and had some dysuria, hematuria and vaginal itching and had some associated nausea, fevers and chills.  Further workup was done and UA was too bloody to report adequately but showed greater than 50 WBCs.  CT of the abdomen pelvis was done and showed distended bladder with hemorrhage and mild bilateral hydroureteronephrosis.  Received IM Cogentin  1 mg, fentanyl , Zofran , Rocephin  and urology was consulted and she was placed on IV fluid hydration.   Since last night her blood count has dropped and she has been to be typed and screened and transfused 2 units of PRBCs.  After the 2 units of blood count is improved and is stable at 8.4.  She continues to have a metabolic acidosis and WBC remains elevated. 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 Found to have abdominal distention, lower abdominal pain and gross hematuria on admission - CT A/P shows distended bladder with hemorrhage as well as mild bilateral hydroureteronephrosis extending to the UVJ - Urology  consulted, recommended three-way Foley catheter and bladder irrigation, this was completed by EDP; Foley catheter was removed several days ago.   Acute Cystitis versus Pyelonephritis urine culture growing ESBL Klebsiella so antibiotics switched from Rocephin  to meropenem .  ID consulted, based on the sensitivities, patient could only get Bactrim DS however patient is allergic to sulfa so we do not have any oral antibiotics options.  ID recommended continuing ertapenem  until 02/25/2024.  Reportedly a midline was placed on 02/15/2024 however there was accidentally pulled out.  IV team was consulted again and PICC line was placed on 02/17/2024.  She will need IV ertapenem  through 02/25/2024.   Left Renal Mass: Patient found to have a 2.6 x 3.0 cm left renal mass on imaging earlier this year. Followed by oncology and urology.  Urology following, appreciate recs and she has follow-up imaging planned next month;    Acute Blood Loss Anemia superimposed on Chronic Anemia secondary to gross hematuria - Followed by oncology in outpatient setting and underwent Bone Marrow Bx where pathology did not indicate underlying bone marrow disorder; Has undergone extensive workup for Anemia and it was felt her Anemia was an Inflammatory Process - Hgb of 8.5 on admission, dropped to 6.3 on 02/10/2024 for which she received 2 units of.  Recent transfusion, hemoglobin over 8 posttransfusion but dropped to 7.4.  Urology is now suspecting bleeding from the kidney, CT renal with contrast ordered by urology 02/13/2024 did not show any source of extravasation/bleeding.  Patient's hemoglobin once again dropped to 6.8 early morning on 02/14/2024 and 1 unit of PRBC transfusion is ordered.  Hemoglobin has remained over 8 since then.  Monitor daily.  AKI on CKD Stage 3a/ Metabolic Acidosis: Baseline creatinine between 0.9-1.05.  Creatinine peaked at 1.4.  She received IV fluids.  Creatinine now down to 1.06 which is her baseline.   Essential  HTN: BP initially elevated on admission, currently blood pressure is normal despite of holding antihypertensives so we will continue to hold for now.    Anxiety and depression Schizoaffective disorder - Lithium  level subtherapeutic at 0.14 - Continue lithium , benztropine , lurasidone , fluphenazine  - Continue sertraline  and lorazepam    Abnormal LFTs/ Elevated LFTs: Elevated on Admission and had normalize but then worsened again.  Discontinued atorvastatin  02/13/2024.  Avoid hepatotoxic agents.  LFTs have started to improve now.   Hx of CVA  HLD Plavix  on hold in the setting of active bleeding.  Hold statin as mentioned above.  Disposition: Patient was going to be discharged on 02/15/2024 after PICC line placement.  All the discharge paperwork was completed.  However, I was informed by TOC afterwards that patient's assisted living facility does not take patients with the PICC line and thus discharge was canceled.  TOC is pursuing SNF after talking to the patient and the family.  We were waiting for insurance authorization all day long.  Authorization was received around 3:20 PM.  Facility unable to accept patient today since it is too late for them.  Plan to discharge tomorrow.  DVT prophylaxis: SCDs Start: 02/10/24 0225   Code Status: Limited: Do not attempt resuscitation (DNR) -DNR-LIMITED -Do Not Intubate/DNI   Family Communication:  None present at bedside.   Status is: Inpatient Remains inpatient appropriate because: Medically stable, pending placement.  Estimated body mass index is 21.44 kg/m as calculated from the following:   Height as of this encounter: 5' (1.524 m).   Weight as of this encounter: 49.8 kg.    Nutritional Assessment: Body mass index is 21.44 kg/m.SABRA Seen by dietician.  I agree with the assessment and plan as outlined below: Nutrition Status:        . Skin Assessment: I have examined the patient's skin and I agree with the wound assessment as performed by the  wound care RN as outlined below:    Consultants:  Urology  Procedures:  None  Antimicrobials:  Anti-infectives (From admission, onward)    Start     Dose/Rate Route Frequency Ordered Stop   02/15/24 0000  ertapenem  (INVANZ ) IVPB        500 mg Intravenous Every 24 hours 02/15/24 1159 02/25/24 2359   02/12/24 1400  meropenem  (MERREM ) 1 g in sodium chloride  0.9 % 100 mL IVPB        1 g 200 mL/hr over 30 Minutes Intravenous Every 12 hours 02/12/24 1254     02/10/24 2300  cefTRIAXone  (ROCEPHIN ) 1 g in sodium chloride  0.9 % 100 mL IVPB  Status:  Discontinued        1 g 200 mL/hr over 30 Minutes Intravenous Every 24 hours 02/10/24 0247 02/12/24 1254   02/09/24 2315  cefTRIAXone  (ROCEPHIN ) 2 g in sodium chloride  0.9 % 100 mL IVPB        2 g 200 mL/hr over 30 Minutes Intravenous  Once 02/09/24 2306 02/09/24 2358         Subjective: Seen and examined earlier today.  She has no complaints.  She is as stable as she has been for the last couple of days.  Objective: Vitals:   02/18/24 1938 02/19/24 0411 02/19/24 0413 02/19/24 1256  BP: 117/77  125/72 134/86  Pulse: 76  76 80  Resp: 18  19 14   Temp: 98.2 F (36.8 C)  98.9 F (37.2 C) 98.9 F (37.2 C)  TempSrc: Oral Oral Oral Oral  SpO2: 100%  98% 100%  Weight:      Height:        Intake/Output Summary (Last 24 hours) at 02/19/2024 1535 Last data filed at 02/19/2024 0342 Gross per 24 hour  Intake 900 ml  Output 1600 ml  Net -700 ml   Filed Weights   02/09/24 1946 02/10/24 0356  Weight: 49.9 kg 49.8 kg    Examination:  General exam: Appears calm and comfortable  Respiratory system: Clear to auscultation. Respiratory effort normal. Cardiovascular system: S1 & S2 heard, RRR. No JVD, murmurs, rubs, gallops or clicks. No pedal edema. Gastrointestinal system: Abdomen is nondistended, soft and nontender. No organomegaly or masses felt. Normal bowel sounds heard. Central nervous system: Alert and oriented. No focal  neurological deficits. Extremities: Symmetric 5 x 5 power. Skin: No rashes, lesions or ulcers.   Data Reviewed: I have personally reviewed following labs and imaging studies  CBC: Recent Labs  Lab 02/14/24 0423 02/14/24 1624 02/15/24 0624 02/16/24 0429 02/17/24 0941  WBC 19.1* 20.2* 23.2* 21.4* 19.2*  NEUTROABS 14.9* 15.7* 18.8* 17.1* 14.8*  HGB 6.8* 8.8* 9.5* 8.5* 8.8*  HCT 22.3* 28.1* 30.6* 26.9* 28.0*  MCV 94.5 93.0 94.7 91.8 92.7  PLT 364 353 419* 387 429*   Basic Metabolic Panel: Recent Labs  Lab 02/13/24 0420 02/14/24 1624 02/15/24 0624 02/16/24 0429 02/17/24 0941  NA 139 137 138 138 139  K 3.7 4.0 4.2 4.0 4.1  CL 115* 110 109 109 112*  CO2 16* 20* 19* 21* 22  GLUCOSE 99 87 79 79 102*  BUN 18 22 21 18 16   CREATININE 1.06* 1.48* 1.31* 1.01* 1.05*  CALCIUM  9.5 9.3 9.6 9.4 9.5  MG 2.0  --   --   --   --   PHOS 2.3*  --   --   --   --    GFR: Estimated Creatinine Clearance: 34.8 mL/min (A) (by C-G formula based on SCr of 1.05 mg/dL (H)). Liver Function Tests: Recent Labs  Lab 02/13/24 0420 02/14/24 1624 02/15/24 0624 02/16/24 0429  AST 191* 242* 179* 75*  ALT 131* 191* 187* 110*  ALKPHOS 157* 171* 178* 146*  BILITOT 0.7 0.7 0.8 0.7  PROT 5.8* 5.9* 6.6 5.9*  ALBUMIN 2.0* 1.9* 2.2* 1.9*   No results for input(s): LIPASE, AMYLASE in the last 168 hours. No results for input(s): AMMONIA in the last 168 hours. Coagulation Profile: No results for input(s): INR, PROTIME in the last 168 hours. Cardiac Enzymes: No results for input(s): CKTOTAL, CKMB, CKMBINDEX, TROPONINI in the last 168 hours. BNP (last 3 results) No results for input(s): PROBNP in the last 8760 hours. HbA1C: No results for input(s): HGBA1C in the last 72 hours. CBG: No results for input(s): GLUCAP in the last 168 hours. Lipid Profile: No results for input(s): CHOL, HDL, LDLCALC, TRIG, CHOLHDL, LDLDIRECT in the last 72 hours. Thyroid Function Tests: No  results for input(s): TSH, T4TOTAL, FREET4, T3FREE, THYROIDAB in the last 72 hours. Anemia Panel: No results for input(s): VITAMINB12, FOLATE, FERRITIN, TIBC, IRON, RETICCTPCT in the last 72 hours. Sepsis Labs: No results for input(s): PROCALCITON, LATICACIDVEN in the last 168 hours.  Recent Results (from the past 240 hours)  Culture, blood (Routine X 2) w Reflex to ID Panel     Status: None  Collection Time: 02/12/24  8:48 PM   Specimen: BLOOD  Result Value Ref Range Status   Specimen Description   Final    BLOOD BLOOD RIGHT ARM Performed at [facility], [address], [place], [place] [address]    Special Requests   Final    BOTTLES DRAWN AEROBIC ONLY Blood Culture results may not be optimal due to an inadequate volume of blood received in culture bottles Performed at [facility], [address], [place], [place] [address]    Culture   Final    NO GROWTH 5 DAYS Performed at [facility], [address] [place], [place] [address]    Report Status 02/17/2024 FINAL  Final  Culture, blood (Routine X 2) w Reflex to ID Panel     Status: None   Collection Time: 02/12/24  9:05 PM   Specimen: BLOOD LEFT HAND  Result Value Ref Range Status   Specimen Description   Final    BLOOD LEFT HAND Performed at [facility], [address], [place], [place] [address]    Special Requests   Final    BOTTLES DRAWN AEROBIC ONLY Blood Culture results may not be optimal due to an inadequate volume of blood received in culture bottles Performed at [facility], [address] [place], [place] [address]    Culture   Final    NO GROWTH 5 DAYS Performed at [facility], [address] [place], [place] [address]    Report Status 02/17/2024 FINAL  Final     Radiology Studies: No results found.    Scheduled Meds:  benztropine   1 mg Oral BID   Chlorhexidine  Gluconate Cloth  6 each Topical  Daily   fluPHENAZine   20 mg Oral BID   lithium  carbonate  150 mg Oral QHS   LORazepam   0.5 mg Oral QID   lurasidone   120 mg Oral Daily   nystatin   5 mL Oral QID   sertraline   150 mg Oral Daily   sodium bicarbonate   650 mg Oral BID   Continuous Infusions:  meropenem  (MERREM ) IV 1 g (02/19/24 0938)   sodium chloride  irrigation 0 mL (02/10/24 0715)     LOS: 9 days   Fredia Skeeter, MD [facility]  02/19/2024, 3:35 PM   *Please note that this is a verbal dictation therefore any spelling or grammatical errors are due to the Dragon Medical One system interpretation.  Please page via Amion and do not message via secure chat for urgent patient care matters. Secure chat can be used for non urgent patient care matters.  How to contact the [facility] Attending or Consulting provider 7A - 7P or covering provider during after hours 7P -7A, for this patient?  Check the care team in [facility] and look for a) attending/consulting [facility] provider listed and b) the [facility] team listed. Page or secure chat 7A-7P. Log into [email or web address] and use [facility]'s universal password to access. If you do not have the password, please contact the hospital operator. Locate the [facility] provider you are looking for under [facility] and page to a number that you can be directly reached. If you still have difficulty reaching the provider, please page the [facility] (Director on Call) for the Hospitalists listed on amion for assistance.

## 2024-02-19 NOTE — TOC Progression Note (Addendum)
 Transition of Care [facility]) - Progression Note    Patient Details  Name: GLENDI MOHIUDDIN MRN: [id number] Date of Birth: 10-25-1950  Transition of Care [facility] CM/SW Contact  Heather DELENA Saltness, LCSW Phone Number: 02/19/2024, 8:57 AM  Clinical Narrative:     [facility]  Insurance authorization has been approved. Auth ID: [id number]. K[facility] at [facility] notified. TOC will continue to follow.  Insurance authorization requested for [employer]. TOC will continue to follow.   Expected Discharge Plan: Skilled Nursing Facility Barriers to Discharge: English as a second language teacher, SNF Pending bed offer, Awaiting State Approval (PASRR)  Expected Discharge Plan and Services In-house Referral: Clinical Social Work   Post Acute Care Choice: Skilled Nursing Facility Living arrangements for the past 2 months: Assisted Living Facility Expected Discharge Date: 02/15/24               DME Arranged: N/A DME Agency: NA    Social Determinants of Health (SDOH) Interventions SDOH Screenings   Food Insecurity: No Food Insecurity (02/10/2024)  Housing: Low Risk  (02/10/2024)  Transportation Needs: No Transportation Needs (02/10/2024)  Utilities: Not At Risk (02/10/2024)  Social Connections: Moderately Integrated (02/10/2024)  Tobacco Use: High Risk (02/09/2024)    Readmission Risk Interventions    09/24/2023   12:58 PM  Readmission Risk Prevention Plan  Post Dischage Appt Complete  Medication Screening Complete  Transportation Screening Complete   Heather Saltness, MSW, LCSW 02/19/2024 9:02 AM

## 2024-02-19 NOTE — Discharge Summary (Incomplete)
 Physician Discharge Summary  MERIT GADSBY FMW:[id number] DOB: [birth year] DOA: 02/09/2024  PCP: Pcp, No  Admit date: 02/09/2024 Discharge date: 02/19/2024 30 Day Unplanned Readmission Risk Score    Flowsheet Row ED to Hosp-Admission (Current) from 02/09/2024 in [facility] 4TH FLOOR PROGRESSIVE CARE AND UROLOGY  30 Day Unplanned Readmission Risk Score (%) 18.41 Filed at 02/15/2024 1200    This score is the patient's risk of an unplanned readmission within 30 days of being discharged (0 -100%). The score is based on dignosis, age, lab data, medications, orders, and past utilization.   Low:  0-14.9   Medium: 15-21.9   High: 22-29.9   Extreme: 30 and above          Admitted From: [facility] Disposition: [facility]  Recommendations for Outpatient Follow-up:  Follow up with PCP in 1-2 weeks Please obtain BMP/CBC in one week Follow-up with [facility]-they will call you to schedule outpatient cystoscopy Please follow up with your PCP on the following pending results: Unresulted Labs (From admission, onward)     Start     Ordered   Unscheduled  Occult blood card to lab, stool  As needed,   R      02/14/24 0821              Home Health: Yes Equipment/Devices: None  Discharge Condition: Stable CODE STATUS: DNR Diet recommendation: Cardiac  Subjective: Seen and examined, she has no complaints at all.  Brief/Interim Summary: The patient is a 73 year old African-American female with past medical history significant for essential hypertension, multiple UTIs, history of CVA, history of schizoaffective disorder, depression and anxiety, insomnia, CKD stage IIIa, chronic diastolic CHF, history of left renal mass with anemia and other comorbidities who presented to the ED for evaluation of lower abdominal pain, vaginal itching and pain as well as hematuria, abdominal pain and dysuria.  Further workup was done and UA was too bloody to report adequately but showed greater  than 50 WBCs.  CT of the abdomen pelvis was done and showed distended bladder with hemorrhage and mild bilateral hydroureteronephrosis.  Received IM Cogentin  1 mg, fentanyl , Zofran , Rocephin  and urology was consulted and she was placed on IV fluid hydration.  Patient was further hospitalized, details of the hospitalization as below.  Bladder obstruction and Bilateral Hydroureteronephrosis  - Patient with recently diagnosed left renal mass concerning for RCC presented with 1 day of hematuria and lower abdominal pain. Found to have abdominal distention, lower abdominal pain and gross hematuria on admission - CT A/P shows distended bladder with hemorrhage as well as mild bilateral hydroureteronephrosis extending to the UVJ - Urology consulted, recommended three-way Foley catheter and bladder irrigation, this was completed by EDP; patient's hematuria resolved, eventually Foley catheter was removed yesterday.  Patient's urine is clear.  Urology has cleared the patient for discharge and they are going to call her to schedule outpatient cystoscopy.   Acute Cystitis versus Pyelonephritis urine culture growing ESBL Klebsiella so antibiotics switched from Rocephin  to meropenem .  ID consulted.  They have recommended continuing ertapenem  until 02/25/2024.  Patient will get PICC line placed before discharge today.   Left Renal Mass: Patient found to have a 2.6 x 3.0 cm left renal mass on imaging earlier this year. Followed by oncology and urology.  Urology followed, cystoscopy as outpatient, they will call and schedule.   Acute Blood Loss Anemia superimposed on Chronic Anemia secondary to gross hematuria - Followed by oncology in outpatient setting and underwent Bone Marrow Bx  where pathology did not indicate underlying bone marrow disorder; Has undergone extensive workup for Anemia and it was felt her Anemia was an Inflammatory Process - Hgb of 8.5 on admission, dropped to 6.3 on 02/10/2024 for which she received 2  units of.  Recent transfusion, hemoglobin over 8 posttransfusion but dropped to 7.4.  Urology is now suspecting bleeding from the kidney, CT renal with contrast ordered by urology 02/13/2024 did not show any source of extravasation/bleeding.  Patient's hemoglobin once again dropped to 6.8 early morning of 02/14/2024 and 1 unit of PRBC transfusion is ordered.  GI bleed has been ruled out in the past.  Patient's hemoglobin has significantly improved, 8.8 after transfusion yesterday and 9.5 today.   AKI on CKD Stage 3a/ Metabolic Acidosis: Baseline creatinine between 0.9-1.05.  Creatinine peaked at 1.4.  She received IV fluids.  Creatinine now down to 1.31 which is very close to her baseline.   Essential HTN: BP initially elevated on admission, currently blood pressure is normal despite of holding antihypertensives so we decided to discontinue her lisinopril  but continue metoprolol  at discharge.   Anxiety and depression Schizoaffective disorder - Lithium  level subtherapeutic at 0.14 - Continue lithium , benztropine , lurasidone , fluphenazine  - Continue sertraline  and lorazepam    Abnormal LFTs/ Elevated LFTs: Elevated on Admission and had normalize but then worsened again and then atorvastatin  was discontinued, however LFTs have improved.  I recommend holding atorvastatin  for another week and then resume if LFTs continue to improve.   Hx of CVA  HLD Plavix  on hold in the setting of active bleeding.  However hemoglobin is stable however I would recommend to hold Plavix  for 4 more days and then resume.  Hold statin as mentioned above.  Discharge plan was discussed with patient and/or family member and they verbalized understanding and agreed with it.  Discharge Diagnoses:  Principal Problem:   Bladder obstruction Active Problems:   Hydroureteronephrosis   Anemia   Bladder hemorrhage   ABLA (acute blood loss anemia)    Discharge Instructions  Discharge Instructions     [facility]  pharmacist to adjust dose for Vancomycin , Aminoglycosides and other anti-infective therapies as requested by physician.   Complete by: As directed    [facility] to provide Cath Flo 2mg    Complete by: As directed    Administer for PICC line occlusion and as ordered by physician for other access device issues.   Anaphylaxis Kit: Provided to treat any anaphylactic reaction to the medication being provided to the patient if First Dose or when requested by physician   Complete by: As directed    Epinephrine 1mg /ml vial / amp: Administer 0.3mg  (0.34ml) subcutaneously once for moderate to severe anaphylaxis, nurse to call physician and pharmacy when reaction occurs and call 911 if needed for immediate care   Diphenhydramine  50mg /ml IV vial: Administer 25-50mg  IV/IM PRN for first dose reaction, rash, itching, mild reaction, nurse to call physician and pharmacy when reaction occurs   Sodium Chloride  0.9% NS 500ml IV: Administer if needed for hypovolemic blood pressure drop or as ordered by physician after call to physician with anaphylactic reaction   Change dressing on IV access line weekly and PRN   Complete by: As directed    Flush IV access with Sodium Chloride  0.9% and Heparin 10 units/ml or 100 units/ml   Complete by: As directed    Home infusion instructions - [facility]   Complete by: As directed    Instructions: Flush IV access with Sodium  Chloride 0.9% and Heparin 10units/ml or 100units/ml   Change dressing on IV access line: Weekly and PRN   Instructions Cath Flo 2mg : Administer for PICC Line occlusion and as ordered by physician for other access device   [facility] pharmacist to adjust dose for: Vancomycin , Aminoglycosides and other anti-infective therapies as requested by physician   Method of administration may be changed at the discretion of home infusion pharmacist based upon assessment of the patient and/or caregiver's ability to self-administer the  medication ordered   Complete by: As directed       Allergies as of 02/19/2024       Reactions   Sulfonamide Derivatives Hives            Medication List     STOP taking these medications    hydrocortisone 2.5 % ointment   lisinopril  20 MG tablet Commonly known as: ZESTRIL        TAKE these medications    acetaminophen  500 MG tablet Commonly known as: TYLENOL  Take 1,000 mg by mouth in the morning and at bedtime.   atorvastatin  40 MG tablet Commonly known as: LIPITOR Take 1 tablet (40 mg total) by mouth daily. Start taking on: February 22, 2024 What changed: These instructions start on February 22, 2024. If you are unsure what to do until then, ask your doctor or other care provider.   Bengay Vanishing Scent 2.5 % Gel Generic drug: Menthol  (Topical Analgesic) Apply topically 3 (three) times daily as needed (BACK PAIN). APPLY TO LOWER BACK THREE TIMES DAILY AS NEEDED FOR BACK PAIN   benztropine  1 MG tablet Commonly known as: COGENTIN  Take 1 mg by mouth 2 (two) times daily.   clopidogrel  75 MG tablet Commonly known as: PLAVIX  Take 1 tablet (75 mg total) by mouth daily.   Cranberry 425 MG Caps Take 425 mg by mouth 2 (two) times a day.   ertapenem  IVPB Commonly known as: INVANZ  Inject 500 mg into the vein daily for 10 days. Indication:  ESBL Klebsiella UTI  First Dose: Yes Last Day of Therapy:  02/25/24  Labs - Once weekly:  CBC/D and BMP, Labs - Once weekly: ESR and CRP Method of administration: Mini-Bag Plus / Gravity Method of administration may be changed at the discretion of home infusion pharmacist based upon assessment of the patient and/or caregiver's ability to self-administer the medication ordered.   estradiol 0.1 MG/GM vaginal cream Commonly known as: ESTRACE Place 1 Applicatorful vaginally. TWICE PER WEEK   FeroSul 325 (65 FE) MG tablet Generic drug: ferrous sulfate Take 325 mg by mouth daily with breakfast.   fluPHENAZine  10 MG tablet Commonly  known as: PROLIXIN  Take 20 mg by mouth in the morning and at bedtime.   ibuprofen  600 MG tablet Commonly known as: ADVIL  Take 600 mg by mouth 2 (two) times daily as needed.   Latuda  120 MG Tabs Generic drug: Lurasidone  HCl Take 120 mg by mouth daily.   lidocaine  5 % Commonly known as: LIDODERM  Place 1-3 patches onto the skin daily. NEED SITE(S) SPECIFIED, FREQUENCY CONFIRMED AND NEED PARAMETERS FOR NUMBER OF PATCHES 12 HRS ON 12 HRS OFF   lithium  carbonate 150 MG capsule Take 1 capsule (150 mg total) by mouth at bedtime.   loratadine  10 MG tablet Commonly known as: CLARITIN  Take 10 mg by mouth daily.   LORazepam  0.5 MG tablet Commonly known as: ATIVAN  Take 1 tablet (0.5 mg total) by mouth in the morning, at noon, in the evening, and at bedtime.  melatonin 5 MG Tabs Take 5 mg by mouth at bedtime.   metoprolol  tartrate 25 MG tablet Commonly known as: LOPRESSOR  Take 25 mg by mouth 2 (two) times daily.   multivitamins ther. w/minerals Tabs tablet Take 1 tablet by mouth daily.   polyethylene glycol powder 17 GM/SCOOP powder Commonly known as: GLYCOLAX /MIRALAX  Take 17 g by mouth daily.   sertraline  100 MG tablet Commonly known as: ZOLOFT  Take 150 mg by mouth daily.   simethicone  80 MG chewable tablet Commonly known as: MYLICON Chew 1 tablet (80 mg total) by mouth 4 (four) times daily.   SYSTANE BALANCE OP Place 1 drop into both eyes 3 (three) times daily.   Vagisil Anti-Itch Medicated 1 % Misc Generic drug: Pramoxine HCl Apply vaginally once a day Apply 1 application topically daily as needed (for vaginal itching). Apply vaginally once a day What changed:  how much to take how to take this when to take this reasons to take this additional instructions               Discharge Care Instructions  (From admission, onward)           Start     Ordered   02/15/24 0000  Change dressing on IV access line weekly and PRN  (Home infusion instructions -  [facility] )        02/15/24 1159            Contact information for follow-up providers     PCP Follow up in 1 week(s).          [facility] Follow up in 1 week(s).   Contact information: [address] [place] [address] [place]  [address] [phone]             Contact information for after-discharge care     Destination     [facility] .   Service: Skilled Nursing Contact information: [address] [place]  [address] [phone]                    Allergies  Allergen Reactions   Sulfonamide Derivatives Hives         Consultations: Urology   Procedures/Studies: US  EKG SITE RITE Result Date: 02/16/2024 If Site Rite image not attached, placement could not be confirmed due to current cardiac rhythm.  CT HEMATURIA WORKUP Result Date: 02/13/2024 CLINICAL DATA:  Lower abdominal pain and hematuria. EXAM: CT ABDOMEN AND PELVIS WITHOUT AND WITH CONTRAST TECHNIQUE: Multidetector CT imaging of the abdomen and pelvis was performed following the standard protocol before and following the bolus administration of intravenous contrast. RADIATION DOSE REDUCTION: This exam was performed according to the departmental dose-optimization program which includes automated exposure control, adjustment of the mA and/or kV according to patient size and/or use of iterative reconstruction technique. CONTRAST:  OMNIPAQUE  IOHEXOL  300 MG/ML  SOLN COMPARISON:  02/09/2024 FINDINGS: Despite efforts by the technologist and patient, motion artifact is present on today's exam and could not be eliminated. This reduces exam sensitivity and specificity. Lower chest: Aortic and dense coronary atherosclerosis. Small pericardial effusion. Mild cardiomegaly. Small amount of fluid in the left major fissure. Linear subsegmental atelectasis in the right lower lobe. Hepatobiliary: Small calcification dependently in the gallbladder  suspicious for cholelithiasis. Otherwise unremarkable. Pancreas: Unremarkable Spleen: Unremarkable Adrenals/Urinary Tract: Stable scarring in the right kidney. At least partially duplicated right renal collecting system. No current hydronephrosis. Foley catheter is present in the urinary bladder which is  otherwise collapsed. Adrenal glands unremarkable. Stomach/Bowel: Prominent stool throughout the colon favors constipation. Mild prominence of scattered gas in the small bowel without overtly dilated small bowel. Assessment of bowel significantly adversely affected by motion artifact. Vascular/Lymphatic: Atherosclerosis is present, including aortoiliac atherosclerotic disease. Considerable atheromatous plaque at the origin of the celiac trunk. The celiac trunk opacifies, accordingly is not felt to be totally occluded. Lesser atheromatous plaque at the origin of the SMA. Reproductive: Unremarkable Other: Trace free pelvic fluid. Musculoskeletal: Markedly severe dextroconvex thoracic and moderate levoconvex lumbar scoliosis with postoperative findings in the spine. Healing left rib fractures similar to previous. Stable vertical fractures of the sacral ala with transverse sclerotic healing fracture at the S2 vertebral level compatible with sacral insufficiency fractures. Left total hip prosthesis with cerclage wires. Stable appearance of fractures of the right pubic rami and right pubic body. Transverse sclerosis in the T10 vertebral body compatible with fracture, no change from previous. Substantial degenerative endplate findings at L4-5 and L5-S1 associated with spondylosis and foraminal narrowing at both levels. There is also right foraminal stenosis at L3-4. IMPRESSION: 1. Prominent stool throughout the colon favors constipation. 2. Foley catheter is present in the urinary bladder which is otherwise collapsed. No current hydronephrosis. Stable scarring in the right kidney. 3. Small pericardial effusion. Mild  cardiomegaly. 4. Cholelithiasis. 5. Stable appearance of fractures of the sacral ala, S2 vertebral level, right pubic rami, and right pubic body. 6. Markedly severe dextroconvex thoracic and moderate levoconvex lumbar scoliosis with postoperative findings in the spine. 7. Substantial degenerative endplate findings at L4-5 and L5-S1 associated with spondylosis and foraminal narrowing at both levels. There is also right foraminal stenosis at L3-4. 8. Trace free pelvic fluid. 9. Aortic Atherosclerosis (ICD10-I70.0). Coronary and systemic atherosclerosis. Electronically Signed   By: Ryan Salvage M.D.   On: 02/13/2024 14:16   US  Abdomen Limited RUQ (LIVER/GB) Result Date: 02/13/2024 CLINICAL DATA:  Abnormal liver function tests. EXAM: ULTRASOUND ABDOMEN LIMITED RIGHT UPPER QUADRANT COMPARISON:  CT abdomen pelvis 02/09/2024. FINDINGS: Gallbladder: No gallstones or wall thickening visualized. No sonographic Murphy sign noted by sonographer. Common bile duct: Diameter: 3 mm, within normal limits. No intrahepatic biliary ductal dilatation. Liver: Liver margin is irregular. Small perihepatic ascites. Portal vein is patent on color Doppler imaging with normal direction of blood flow towards the liver. Other: None. IMPRESSION: 1. No acute findings. 2. Irregular liver contour suggests cirrhosis. Please correlate clinically. 3. Small perihepatic ascites. Electronically Signed   By: Newell Eke M.D.   On: 02/13/2024 09:56   DG CHEST PORT 1 VIEW Result Date: 02/13/2024 CLINICAL DATA:  Leukocytosis. EXAM: PORTABLE CHEST 1 VIEW COMPARISON:  09/22/2023 FINDINGS: The cardio pericardial silhouette is enlarged. Left lung clear. Interval development of collapse/consolidation with probable small effusion at the right base, suspicious for pneumonia. Bones are diffusely demineralized. Thoracolumbar fusion hardware evident with underlying scoliosis. IMPRESSION: Interval development of collapse/consolidation with probable small  effusion at the right base, suspicious for pneumonia. Electronically Signed   By: Camellia Candle M.D.   On: 02/13/2024 09:42   CT ABDOMEN PELVIS W CONTRAST Result Date: 02/09/2024 EXAM: CT ABDOMEN AND PELVIS WITH CONTRAST 02/09/2024 10:47:56 PM TECHNIQUE: CT of the abdomen and pelvis was performed with the administration of intravenous contrast. Multiplanar reformatted images are provided for review. Automated exposure control, iterative reconstruction, and/or weight based adjustment of the mA/kV was utilized to reduce the radiation dose to as low as reasonably achievable. COMPARISON: 09/24/2023 CLINICAL HISTORY: Vaginal bleeding. Pt EMS from [facility] with  reports abdominal pain, vaginal itching and pain for the last day. Staff at [facility] noted some vaginal bleeding today. Pt alert and oriented X 4. FINDINGS: LOWER CHEST: No acute abnormality. LIVER: The liver is unremarkable. GALLBLADDER AND BILE DUCTS: Gallbladder is unremarkable. No biliary ductal dilatation. SPLEEN: No acute abnormality. PANCREAS: No acute abnormality. ADRENAL GLANDS: No acute abnormality. KIDNEYS, URETERS AND BLADDER: Mild bilateral hydroureteronephrosis extending to the UVJ, suggesting sequela of bladder outlet obstruction. Distended bladder with hemorrhage. No stones in the kidneys or ureters. No perinephric or periureteral stranding. GI AND BOWEL: Normal appendix (image 57). Stomach demonstrates no acute abnormality. There is no bowel obstruction. No bowel wall thickening. PERITONEUM AND RETROPERITONEUM: No ascites. No free air. VASCULATURE: Atherosclerotic calcifications of the abdominal aorta and branch vessels, although patent. LYMPH NODES: No lymphadenopathy. REPRODUCTIVE ORGANS: No acute abnormality. BONES AND SOFT TISSUES: Old bilateral sacral and right pelvic fractures. Left hip arthroplasty. S-shaped thoracolumbar scoliosis with multilevel degenerative changes, including mild inferior endplate changes at T10 which are  chronic. No acute osseous abnormality. No focal soft tissue abnormality. IMPRESSION: 1. Distended bladder with hemorrhage. 2. Mild bilateral hydroureteronephrosis extending to the UVJ, suggesting sequela of bladder obstruction. Electronically signed by: Pinkie Pebbles MD 02/09/2024 11:24 PM EDT RP Workstation: [id number]     Discharge Exam: Vitals:   02/18/24 1938 02/19/24 0413  BP: 117/77 125/72  Pulse: 76 76  Resp: 18 19  Temp: 98.2 F (36.8 C) 98.9 F (37.2 C)  SpO2: 100% 98%   Vitals:   02/18/24 1236 02/18/24 1938 02/19/24 0411 02/19/24 0413  BP: 114/86 117/77  125/72  Pulse: 88 76  76  Resp: 16 18  19   Temp: 98.9 F (37.2 C) 98.2 F (36.8 C)  98.9 F (37.2 C)  TempSrc: Oral Oral Oral Oral  SpO2: 98% 100%  98%  Weight:      Height:        General: Pt is alert, awake, not in acute distress Cardiovascular: RRR, S1/S2 +, no rubs, no gallops Respiratory: CTA bilaterally, no wheezing, no rhonchi Abdominal: Soft, NT, ND, bowel sounds + Extremities: no edema, no cyanosis    The results of significant diagnostics from this hospitalization (including imaging, microbiology, ancillary and laboratory) are listed below for reference.     Microbiology: Recent Results (from the past 240 hours)  Culture, blood (Routine X 2) w Reflex to ID Panel     Status: None   Collection Time: 02/12/24  8:48 PM   Specimen: BLOOD  Result Value Ref Range Status   Specimen Description   Final    BLOOD BLOOD RIGHT ARM Performed at [facility], [address] [place], [place] [address]    Special Requests   Final    BOTTLES DRAWN AEROBIC ONLY Blood Culture results may not be optimal due to an inadequate volume of blood received in culture bottles Performed at [facility], [address] [place], [place] [address]    Culture   Final    NO GROWTH 5 DAYS Performed at [facility], [address], [place], [place] [address]    Report Status  02/17/2024 FINAL  Final  Culture, blood (Routine X 2) w Reflex to ID Panel     Status: None   Collection Time: 02/12/24  9:05 PM   Specimen: BLOOD LEFT HAND  Result Value Ref Range Status   Specimen Description   Final    BLOOD LEFT HAND Performed at [facility],  [address], [place], [place] [address]    Special Requests   Final    BOTTLES DRAWN AEROBIC ONLY Blood Culture results may not be optimal due to an inadequate volume of blood received in culture bottles Performed at [facility], [address] [place], [place] [address]    Culture   Final    NO GROWTH 5 DAYS Performed at [facility], [address] [place], [place] [address]    Report Status 02/17/2024 FINAL  Final     Labs: BNP (last 3 results) No results for input(s): BNP in the last 8760 hours. Basic Metabolic Panel: Recent Labs  Lab 02/12/24 1033 02/13/24 0420 02/14/24 1624 02/15/24 0624 02/16/24 0429 02/17/24 0941  NA 136 139 137 138 138 139  K 3.5 3.7 4.0 4.2 4.0 4.1  CL 110 115* 110 109 109 112*  CO2 18* 16* 20* 19* 21* 22  GLUCOSE 119* 99 87 79 79 102*  BUN 21 18 22 21 18 16   CREATININE 1.08* 1.06* 1.48* 1.31* 1.01* 1.05*  CALCIUM  9.8 9.5 9.3 9.6 9.4 9.5  MG 2.1 2.0  --   --   --   --   PHOS 2.6 2.3*  --   --   --   --    Liver Function Tests: Recent Labs  Lab 02/12/24 1033 02/13/24 0420 02/14/24 1624 02/15/24 0624 02/16/24 0429  AST 148* 191* 242* 179* 75*  ALT 84* 131* 191* 187* 110*  ALKPHOS 178* 157* 171* 178* 146*  BILITOT 0.6 0.7 0.7 0.8 0.7  PROT 6.6 5.8* 5.9* 6.6 5.9*  ALBUMIN 2.2* 2.0* 1.9* 2.2* 1.9*   No results for input(s): LIPASE, AMYLASE in the last 168 hours. No results for input(s): AMMONIA in the last 168 hours. CBC: Recent Labs  Lab 02/14/24 0423 02/14/24 1624 02/15/24 0624 02/16/24 0429 02/17/24 0941  WBC 19.1* 20.2* 23.2* 21.4* 19.2*  NEUTROABS 14.9* 15.7* 18.8* 17.1* 14.8*  HGB 6.8* 8.8* 9.5* 8.5* 8.8*  HCT  22.3* 28.1* 30.6* 26.9* 28.0*  MCV 94.5 93.0 94.7 91.8 92.7  PLT 364 353 419* 387 429*   Cardiac Enzymes: No results for input(s): CKTOTAL, CKMB, CKMBINDEX, TROPONINI in the last 168 hours. BNP: Invalid input(s): POCBNP CBG: No results for input(s): GLUCAP in the last 168 hours. D-Dimer No results for input(s): DDIMER in the last 72 hours. Hgb A1c No results for input(s): HGBA1C in the last 72 hours. Lipid Profile No results for input(s): CHOL, HDL, LDLCALC, TRIG, CHOLHDL, LDLDIRECT in the last 72 hours. Thyroid function studies No results for input(s): TSH, T4TOTAL, T3FREE, THYROIDAB in the last 72 hours.  Invalid input(s): FREET3 Anemia work up No results for input(s): VITAMINB12, FOLATE, FERRITIN, TIBC, IRON, RETICCTPCT in the last 72 hours. Urinalysis    Component Value Date/Time   COLORURINE RED (A) 02/09/2024 0021   APPEARANCEUR TURBID (A) 02/09/2024 0021   LABSPEC  02/09/2024 0021    TEST NOT REPORTED DUE TO COLOR INTERFERENCE OF URINE PIGMENT   PHURINE  02/09/2024 0021    TEST NOT REPORTED DUE TO COLOR INTERFERENCE OF URINE PIGMENT   GLUCOSEU (A) 02/09/2024 0021    TEST NOT REPORTED DUE TO COLOR INTERFERENCE OF URINE PIGMENT   HGBUR (A) 02/09/2024 0021    TEST NOT REPORTED DUE TO COLOR INTERFERENCE OF URINE PIGMENT   BILIRUBINUR (A) 02/09/2024 0021    TEST NOT REPORTED DUE TO COLOR INTERFERENCE OF URINE PIGMENT   KETONESUR (A) 02/09/2024 0021  TEST NOT REPORTED DUE TO COLOR INTERFERENCE OF URINE PIGMENT   PROTEINUR (A) 02/09/2024 0021    TEST NOT REPORTED DUE TO COLOR INTERFERENCE OF URINE PIGMENT   UROBILINOGEN 1.0 08/05/2011 1751   NITRITE (A) 02/09/2024 0021    TEST NOT REPORTED DUE TO COLOR INTERFERENCE OF URINE PIGMENT   LEUKOCYTESUR (A) 02/09/2024 0021    TEST NOT REPORTED DUE TO COLOR INTERFERENCE OF URINE PIGMENT   Sepsis Labs Recent Labs  Lab 02/14/24 1624 02/15/24 0624 02/16/24 0429  02/17/24 0941  WBC 20.2* 23.2* 21.4* 19.2*   Microbiology Recent Results (from the past 240 hours)  Culture, blood (Routine X 2) w Reflex to ID Panel     Status: None   Collection Time: 02/12/24  8:48 PM   Specimen: BLOOD  Result Value Ref Range Status   Specimen Description   Final    BLOOD BLOOD RIGHT ARM Performed at [facility], [address], [place], [place] [address]    Special Requests   Final    BOTTLES DRAWN AEROBIC ONLY Blood Culture results may not be optimal due to an inadequate volume of blood received in culture bottles Performed at [facility], [address], [place], [place] [address]    Culture   Final    NO GROWTH 5 DAYS Performed at [facility], [address] [place], [place] [address]    Report Status 02/17/2024 FINAL  Final  Culture, blood (Routine X 2) w Reflex to ID Panel     Status: None   Collection Time: 02/12/24  9:05 PM   Specimen: BLOOD LEFT HAND  Result Value Ref Range Status   Specimen Description   Final    BLOOD LEFT HAND Performed at [facility], [address], [place], [place] [address]    Special Requests   Final    BOTTLES DRAWN AEROBIC ONLY Blood Culture results may not be optimal due to an inadequate volume of blood received in culture bottles Performed at [facility], [address] [place], [place] [address]    Culture   Final    NO GROWTH 5 DAYS Performed at [facility], [address] [place], [place] [address]    Report Status 02/17/2024 FINAL  Final    FURTHER DISCHARGE INSTRUCTIONS:   Get Medicines reviewed and adjusted: Please take all your medications with you for your next visit with your Primary MD   Laboratory/radiological data: Please request your Primary MD to go over all hospital tests and procedure/radiological results at the follow up, please ask your Primary MD to get all Hospital records sent to his/her office.   In some  cases, they will be blood work, cultures and biopsy results pending at the time of your discharge. Please request that your primary care M.D. goes through all the records of your hospital data and follows up on these results.   Also Note the following: If you experience worsening of your admission symptoms, develop shortness of breath, life threatening emergency, suicidal or homicidal thoughts you must seek medical attention immediately by calling 911 or calling your MD immediately  if symptoms less severe.   You must read complete instructions/literature along with all the possible adverse reactions/side effects for all the Medicines you take and that have been prescribed to you. Take any new Medicines after you have completely understood and accpet all the possible adverse reactions/side effects.    patient was instructed, not to  drive, operate heavy machinery, perform activities at heights, swimming or participation in water activities or provide baby-sitting services while on Pain, Sleep and Anxiety Medications; until their outpatient Physician has advised to do so again. Also recommended to not to take more than prescribed Pain, Sleep and Anxiety Medications.  It is not advisable to combine anxiety, sleep and pain medications without talking with your primary care provider.     Wear Seat belts while driving.   Please note: You were cared for by a hospitalist during your hospital stay. Once you are discharged, your primary care physician will handle any further medical issues. Please note that NO REFILLS for any discharge medications will be authorized once you are discharged, as it is imperative that you return to your primary care physician (or establish a relationship with a primary care physician if you do not have one) for your post hospital discharge needs so that they can reassess your need for medications and monitor your lab values  Time coordinating discharge: Over 30  minutes  SIGNED:   Fredia Skeeter, MD  [facility] 02/19/2024, 8:11 AM *Please note that this is a verbal dictation therefore any spelling or grammatical errors are due to the Dragon Medical One system interpretation. If 7PM-7AM, please contact night-coverage [email or web address]

## 2024-02-20 DIAGNOSIS — N320 Bladder-neck obstruction: Secondary | ICD-10-CM | POA: Diagnosis not present

## 2024-02-20 NOTE — Care Plan (Signed)
 Attempted to call report to facility, sherry took message, SNF RN to call 4W RN.

## 2024-02-20 NOTE — Plan of Care (Signed)

## 2024-02-20 NOTE — TOC Transition Note (Signed)
 Transition of Care [facility]) - Discharge Note   Patient Details  Name: Heidi Blevins MRN: [id number] Date of Birth: 09-03-1950  [facility] CM/SW Contact:  Heather DELENA Saltness, LCSW Phone Number: 02/20/2024, 10:09 AM   Clinical Narrative:    Pt to discharge to [facility] for short-term SNF rehab. Pt accepted to room 111p. D/C packet with singed DNR and prescription placed in pt's chart at RN station. RN to call report to [phone]. PT[facility] called at 10:23 AM. PT[facility] to pick pt up around 1 PM. [name], Mayo Owczarzak, notified of transfer, and in agreement with discharge plan. No further TOC needs at this time.   Final next level of care: Skilled Nursing Facility Barriers to Discharge: Barriers Resolved   Patient Goals and CMS Choice Patient states their goals for this hospitalization and ongoing recovery are:: To go to [facility] for SNF CMS Medicare.gov Compare Post Acute Care list provided to:: Patient Represenative (must comment) (pt's son, Conya Ellinwood) Choice offered to / list presented to : Adult Children [facility]ady ownership interest in [facility]provided to:: Adult Children    Discharge Placement [facility]              Patient to be transferred to facility by: PTAR Name of family member notified: Melvinia Ashby Patient and family notified of of transfer: 02/20/24  Discharge Plan and Services Additional resources added to the After Visit Summary for N/A In-house Referral: Clinical Social Work   Post Acute Care Choice: Skilled Nursing Facility          DME Arranged: N/A DME Agency: NA         HH Agency: NA        Social Drivers of Health (SDOH) Interventions SDOH Screenings   Food Insecurity: No Food Insecurity (02/10/2024)  Housing: Low Risk  (02/10/2024)  Transportation Needs: No Transportation Needs (02/10/2024)  Utilities: Not At Risk (02/10/2024)  Social Connections: Moderately Integrated (02/10/2024)  Tobacco Use:  High Risk (02/09/2024)     Readmission Risk Interventions    09/24/2023   12:58 PM  Readmission Risk Prevention Plan  Post Dischage Appt Complete  Medication Screening Complete  Transportation Screening Complete     Heather Saltness, MSW, LCSW 02/20/2024 10:30 AM

## 2024-02-20 NOTE — Discharge Summary (Signed)
 Physician Discharge Summary  Heidi Blevins FMW:[id number] DOB: [birth year] DOA: 02/09/2024  PCP: Pcp, No  Admit date: 02/09/2024 Discharge date: 02/20/2024   Discharge disposition: Skilled nursing facility   Recommendations for Outpatient Follow-Up:   CBC/CMP 1 week PICC and completing 2 weeks of Carbapenem based therapy EOT 6/30  Outpatient urology follow up   Discharge Diagnosis:   Principal Problem:   Bladder obstruction Active Problems:   Hydroureteronephrosis   Anemia   Bladder hemorrhage   ABLA (acute blood loss anemia)    Discharge Condition: Stable  Diet recommendation: Regular.    Code status: DNR   History of Present Illness:   73 year old African-American female with past medical history significant for Mannam to essential hypertension, multiple UTIs, history of CVA, history of schizoaffective disorder, depression and anxiety, insomnia, CKD stage IIIa, chronic diastolic CHF, history of left renal mass with anemia and other comorbidities who presented to the ED for evaluation of lower abdominal pain, vaginal itching and pain as well as hematuria.  She started having abdominal pain last night and had some dysuria, hematuria and vaginal itching and had some associated nausea, fevers and chills.  Further workup was done and UA was too bloody to report adequately but showed greater than 50 WBCs.  CT of the abdomen pelvis was done and showed distended bladder with hemorrhage and mild bilateral hydroureteronephrosis.  Received IM Cogentin  1 mg, fentanyl , Zofran , Rocephin  and urology was consulted and she was placed on IV fluid hydration.   Since last night her blood count has dropped and she has been to be typed and screened and transfused 2 units of PRBCs.  After the 2 units of blood count is improved and is stable at 8.4.  She continues to have a metabolic acidosis and WBC remains elevated. Changed Abx to IV Meropenem    Hospital Course by Problem:    Bladder obstruction and Bilateral Hydroureteronephrosis  - Patient with recently diagnosed left renal mass concerning for RCC presented with 1 day of hematuria and lower abdominal pain. Found to have abdominal distention, lower abdominal pain and gross hematuria on admission - CT A/P shows distended bladder with hemorrhage as well as mild bilateral hydroureteronephrosis extending to the UVJ - Urology consulted, recommended three-way Foley catheter and bladder irrigation, this was completed by EDP; Foley catheter was removed several days ago.   Acute Cystitis versus Pyelonephritis urine culture growing ESBL Klebsiella so antibiotics switched from Rocephin  to meropenem .  ID consulted, based on the sensitivities, patient could only get Bactrim DS however patient is allergic to sulfa so we do not have any oral antibiotics options.  ID recommended continuing ertapenem  until 02/25/2024.  Reportedly a midline was placed on 02/15/2024 however there was accidentally pulled out.  IV team was consulted again and PICC line was placed on 02/17/2024.  She will need IV ertapenem  through 02/25/2024.   Left Renal Mass: Patient found to have a 2.6 x 3.0 cm left renal mass on imaging earlier this year. Followed by oncology and urology.  Urology following, appreciate recs and she has follow-up imaging planned next month;    Acute Blood Loss Anemia superimposed on Chronic Anemia secondary to gross hematuria - Followed by oncology in outpatient setting and underwent Bone Marrow Bx where pathology did not indicate underlying bone marrow disorder; Has undergone extensive workup for Anemia and it was felt her Anemia was an Inflammatory Process - Hgb of 8.5 on admission, dropped to 6.3 on 02/10/2024 for which she received  2 units of.  Recent transfusion, hemoglobin over 8 posttransfusion but dropped to 7.4.  Urology is now suspecting bleeding from the kidney, CT renal with contrast ordered by urology 02/13/2024 did not show any  source of extravasation/bleeding.  Patient's hemoglobin once again dropped to 6.8 early morning on 02/14/2024 and 1 unit of PRBC transfusion is ordered.  Hemoglobin has remained over 8 since then.  Monitor daily.   AKI on CKD Stage 3a/ Metabolic Acidosis: Baseline creatinine between 0.9-1.05.  Creatinine peaked at 1.4.  She received IV fluids.  Creatinine now down to 1.06 which is her baseline.   Essential HTN: BP initially elevated on admission, currently blood pressure is normal despite of holding  -resume meds as able   Anxiety and depression Schizoaffective disorder - Lithium  level subtherapeutic at 0.14 - Continue lithium , benztropine , lurasidone , fluphenazine  - Continue sertraline  and lorazepam    Abnormal LFTs/ Elevated LFTs: Elevated on Admission and had normalize but then worsened again.  Discontinued atorvastatin  02/13/2024.  Avoid hepatotoxic agents.  LFTs have started to improve now.   Hx of CVA  HLD Plavix  on hold in the setting of active bleeding.  Hold statin as mentioned above.    Medical Consultants:   Urology ID   Discharge Exam:   Vitals:   02/19/24 1927 02/20/24 0453  BP: 130/71 136/85  Pulse: 83 79  Resp: 18 18  Temp: 98.2 F (36.8 C) 98.3 F (36.8 C)  SpO2: 99% 100%   Vitals:   02/19/24 0413 02/19/24 1256 02/19/24 1927 02/20/24 0453  BP: 125/72 134/86 130/71 136/85  Pulse: 76 80 83 79  Resp: 19 14 18 18   Temp: 98.9 F (37.2 C) 98.9 F (37.2 C) 98.2 F (36.8 C) 98.3 F (36.8 C)  TempSrc: Oral Oral Oral Oral  SpO2: 98% 100% 99% 100%  Weight:      Height:        General exam: Appears calm and comfortable.   The results of significant diagnostics from this hospitalization (including imaging, microbiology, ancillary and laboratory) are listed below for reference.     Procedures and Diagnostic Studies:   CT ABDOMEN PELVIS W CONTRAST Result Date: 02/09/2024 EXAM: CT ABDOMEN AND PELVIS WITH CONTRAST 02/09/2024 10:47:56 PM TECHNIQUE: CT of the  abdomen and pelvis was performed with the administration of intravenous contrast. Multiplanar reformatted images are provided for review. Automated exposure control, iterative reconstruction, and/or weight based adjustment of the mA/kV was utilized to reduce the radiation dose to as low as reasonably achievable. COMPARISON: 09/24/2023 CLINICAL HISTORY: Vaginal bleeding. Pt EMS from [facility] with reports abdominal pain, vaginal itching and pain for the last day. Staff at [facility] noted some vaginal bleeding today. Pt alert and oriented X 4. FINDINGS: LOWER CHEST: No acute abnormality. LIVER: The liver is unremarkable. GALLBLADDER AND BILE DUCTS: Gallbladder is unremarkable. No biliary ductal dilatation. SPLEEN: No acute abnormality. PANCREAS: No acute abnormality. ADRENAL GLANDS: No acute abnormality. KIDNEYS, URETERS AND BLADDER: Mild bilateral hydroureteronephrosis extending to the UVJ, suggesting sequela of bladder outlet obstruction. Distended bladder with hemorrhage. No stones in the kidneys or ureters. No perinephric or periureteral stranding. GI AND BOWEL: Normal appendix (image 57). Stomach demonstrates no acute abnormality. There is no bowel obstruction. No bowel wall thickening. PERITONEUM AND RETROPERITONEUM: No ascites. No free air. VASCULATURE: Atherosclerotic calcifications of the abdominal aorta and branch vessels, although patent. LYMPH NODES: No lymphadenopathy. REPRODUCTIVE ORGANS: No acute abnormality. BONES AND SOFT TISSUES: Old bilateral sacral and right pelvic fractures. Left hip arthroplasty. S-shaped  thoracolumbar scoliosis with multilevel degenerative changes, including mild inferior endplate changes at T10 which are chronic. No acute osseous abnormality. No focal soft tissue abnormality. IMPRESSION: 1. Distended bladder with hemorrhage. 2. Mild bilateral hydroureteronephrosis extending to the UVJ, suggesting sequela of bladder obstruction. Electronically signed by: Pinkie Pebbles  MD 02/09/2024 11:24 PM EDT RP Workstation: [id number]     Labs:   Basic Metabolic Panel: Recent Labs  Lab 02/14/24 1624 02/15/24 0624 02/16/24 0429 02/17/24 0941  NA 137 138 138 139  K 4.0 4.2 4.0 4.1  CL 110 109 109 112*  CO2 20* 19* 21* 22  GLUCOSE 87 79 79 102*  BUN 22 21 18 16   CREATININE 1.48* 1.31* 1.01* 1.05*  CALCIUM  9.3 9.6 9.4 9.5   GFR Estimated Creatinine Clearance: 34.8 mL/min (A) (by C-G formula based on SCr of 1.05 mg/dL (H)). Liver Function Tests: Recent Labs  Lab 02/14/24 1624 02/15/24 0624 02/16/24 0429  AST 242* 179* 75*  ALT 191* 187* 110*  ALKPHOS 171* 178* 146*  BILITOT 0.7 0.8 0.7  PROT 5.9* 6.6 5.9*  ALBUMIN 1.9* 2.2* 1.9*   No results for input(s): LIPASE, AMYLASE in the last 168 hours. No results for input(s): AMMONIA in the last 168 hours. Coagulation profile No results for input(s): INR, PROTIME in the last 168 hours.  CBC: Recent Labs  Lab 02/14/24 0423 02/14/24 1624 02/15/24 0624 02/16/24 0429 02/17/24 0941  WBC 19.1* 20.2* 23.2* 21.4* 19.2*  NEUTROABS 14.9* 15.7* 18.8* 17.1* 14.8*  HGB 6.8* 8.8* 9.5* 8.5* 8.8*  HCT 22.3* 28.1* 30.6* 26.9* 28.0*  MCV 94.5 93.0 94.7 91.8 92.7  PLT 364 353 419* 387 429*   Cardiac Enzymes: No results for input(s): CKTOTAL, CKMB, CKMBINDEX, TROPONINI in the last 168 hours. BNP: Invalid input(s): POCBNP CBG: No results for input(s): GLUCAP in the last 168 hours. D-Dimer No results for input(s): DDIMER in the last 72 hours. Hgb A1c No results for input(s): HGBA1C in the last 72 hours. Lipid Profile No results for input(s): CHOL, HDL, LDLCALC, TRIG, CHOLHDL, LDLDIRECT in the last 72 hours. Thyroid function studies No results for input(s): TSH, T4TOTAL, T3FREE, THYROIDAB in the last 72 hours.  Invalid input(s): FREET3 Anemia work up No results for input(s): VITAMINB12, FOLATE, FERRITIN, TIBC, IRON, RETICCTPCT in the last 72  hours. Microbiology Recent Results (from the past 240 hours)  Culture, blood (Routine X 2) w Reflex to ID Panel     Status: None   Collection Time: 02/12/24  8:48 PM   Specimen: BLOOD  Result Value Ref Range Status   Specimen Description   Final    BLOOD BLOOD RIGHT ARM Performed at [facility], [address], [place], [place] [address]    Special Requests   Final    BOTTLES DRAWN AEROBIC ONLY Blood Culture results may not be optimal due to an inadequate volume of blood received in culture bottles Performed at [facility], [address] [place], [place] [address]    Culture   Final    NO GROWTH 5 DAYS Performed at [facility], [address], [place], [place] [address]    Report Status 02/17/2024 FINAL  Final  Culture, blood (Routine X 2) w Reflex to ID Panel     Status: None   Collection Time: 02/12/24  9:05 PM   Specimen: BLOOD LEFT HAND  Result Value Ref Range Status   Specimen Description   Final    BLOOD LEFT HAND Performed at [facility], [address] [place], [place] [address]    Special Requests   Final    BOTTLES DRAWN AEROBIC ONLY Blood Culture results may not be optimal due to an inadequate volume of blood received in culture bottles Performed at [facility], [address] [place], [place] [address]    Culture   Final    NO GROWTH 5 DAYS Performed at [facility], [address] [place], [place] [address]    Report Status 02/17/2024 FINAL  Final     Discharge Instructions:   Discharge Instructions     [facility] pharmacist to adjust dose for Vancomycin , Aminoglycosides and other anti-infective therapies as requested by physician.   Complete by: As directed    [facility] to provide Cath Flo 2mg    Complete by: As directed    Administer for PICC line occlusion and as ordered by physician for other access device issues.   Anaphylaxis Kit: Provided to  treat any anaphylactic reaction to the medication being provided to the patient if First Dose or when requested by physician   Complete by: As directed    Epinephrine 1mg /ml vial / amp: Administer 0.3mg  (0.39ml) subcutaneously once for moderate to severe anaphylaxis, nurse to call physician and pharmacy when reaction occurs and call 911 if needed for immediate care   Diphenhydramine  50mg /ml IV vial: Administer 25-50mg  IV/IM PRN for first dose reaction, rash, itching, mild reaction, nurse to call physician and pharmacy when reaction occurs   Sodium Chloride  0.9% NS 500ml IV: Administer if needed for hypovolemic blood pressure drop or as ordered by physician after call to physician with anaphylactic reaction   Change dressing on IV access line weekly and PRN   Complete by: As directed    Flush IV access with Sodium Chloride  0.9% and Heparin 10 units/ml or 100 units/ml   Complete by: As directed    Home infusion instructions - [facility]   Complete by: As directed    Instructions: Flush IV access with Sodium Chloride  0.9% and Heparin 10units/ml or 100units/ml   Change dressing on IV access line: Weekly and PRN   Instructions Cath Flo 2mg : Administer for PICC Line occlusion and as ordered by physician for other access device   [facility] pharmacist to adjust dose for: Vancomycin , Aminoglycosides and other anti-infective therapies as requested by physician   Method of administration may be changed at the discretion of home infusion pharmacist based upon assessment of the patient and/or caregiver's ability to self-administer the medication ordered   Complete by: As directed       Allergies as of 02/20/2024       Reactions   Sulfonamide Derivatives Hives            Medication List     STOP taking these medications    hydrocortisone 2.5 % ointment   lisinopril  20 MG tablet Commonly known as: ZESTRIL        TAKE these medications    acetaminophen  500 MG  tablet Commonly known as: TYLENOL  Take 1,000 mg by mouth in the morning and at bedtime.   atorvastatin  40 MG tablet Commonly known as: LIPITOR Take 1 tablet (40 mg total) by mouth daily. Start taking on: February 22, 2024 What changed: These instructions start on February 22, 2024. If you are unsure what to do until then, ask your doctor or other care provider.   Bengay Vanishing Scent 2.5 % Gel Generic drug: Menthol  (Topical  Analgesic) Apply topically 3 (three) times daily as needed (BACK PAIN). APPLY TO LOWER BACK THREE TIMES DAILY AS NEEDED FOR BACK PAIN   benztropine  1 MG tablet Commonly known as: COGENTIN  Take 1 mg by mouth 2 (two) times daily.   clopidogrel  75 MG tablet Commonly known as: PLAVIX  Take 1 tablet (75 mg total) by mouth daily.   Cranberry 425 MG Caps Take 425 mg by mouth 2 (two) times a day.   ertapenem  IVPB Commonly known as: INVANZ  Inject 500 mg into the vein daily for 10 days. Indication:  ESBL Klebsiella UTI  First Dose: Yes Last Day of Therapy:  02/25/24  Labs - Once weekly:  CBC/D and BMP, Labs - Once weekly: ESR and CRP Method of administration: Mini-Bag Plus / Gravity Method of administration may be changed at the discretion of home infusion pharmacist based upon assessment of the patient and/or caregiver's ability to self-administer the medication ordered.   estradiol 0.1 MG/GM vaginal cream Commonly known as: ESTRACE Place 1 Applicatorful vaginally. TWICE PER WEEK   FeroSul 325 (65 FE) MG tablet Generic drug: ferrous sulfate Take 325 mg by mouth daily with breakfast.   fluPHENAZine  10 MG tablet Commonly known as: PROLIXIN  Take 20 mg by mouth in the morning and at bedtime.   ibuprofen  600 MG tablet Commonly known as: ADVIL  Take 600 mg by mouth 2 (two) times daily as needed.   Latuda  120 MG Tabs Generic drug: Lurasidone  HCl Take 120 mg by mouth daily.   lidocaine  5 % Commonly known as: LIDODERM  Place 1-3 patches onto the skin daily. NEED  SITE(S) SPECIFIED, FREQUENCY CONFIRMED AND NEED PARAMETERS FOR NUMBER OF PATCHES 12 HRS ON 12 HRS OFF   lithium  carbonate 150 MG capsule Take 1 capsule (150 mg total) by mouth at bedtime.   loratadine  10 MG tablet Commonly known as: CLARITIN  Take 10 mg by mouth daily.   LORazepam  0.5 MG tablet Commonly known as: ATIVAN  Take 1 tablet (0.5 mg total) by mouth in the morning, at noon, in the evening, and at bedtime.   melatonin 5 MG Tabs Take 5 mg by mouth at bedtime.   metoprolol  tartrate 25 MG tablet Commonly known as: LOPRESSOR  Take 25 mg by mouth 2 (two) times daily.   multivitamins ther. w/minerals Tabs tablet Take 1 tablet by mouth daily.   polyethylene glycol powder 17 GM/SCOOP powder Commonly known as: GLYCOLAX /MIRALAX  Take 17 g by mouth daily.   sertraline  100 MG tablet Commonly known as: ZOLOFT  Take 150 mg by mouth daily.   simethicone  80 MG chewable tablet Commonly known as: MYLICON Chew 1 tablet (80 mg total) by mouth 4 (four) times daily.   SYSTANE BALANCE OP Place 1 drop into both eyes 3 (three) times daily.   Vagisil Anti-Itch Medicated 1 % Misc Generic drug: Pramoxine HCl Apply vaginally once a day Apply 1 application topically daily as needed (for vaginal itching). Apply vaginally once a day What changed:  how much to take how to take this when to take this reasons to take this additional instructions               Discharge Care Instructions  (From admission, onward)           Start     Ordered   02/15/24 0000  Change dressing on IV access line weekly and PRN  (Home infusion instructions - [facility] )        02/15/24 1159  Contact information for follow-up providers     PCP Follow up in 1 week(s).          [facility] Follow up in 1 week(s).   Contact information: [address] [place] [address] [place]  [address] [phone]             Contact information for  after-discharge care     Destination     [facility] .   Service: Skilled Nursing Contact information: [address] [place]  [address] [phone]                      Time coordinating discharge: 45 min  Signed:  Harlene RAYMOND Bowl DO  [facility] 02/20/2024, 7:53 AM

## 2024-03-05 ENCOUNTER — Telehealth: Payer: Self-pay

## 2024-03-05 ENCOUNTER — Inpatient Hospital Stay: Payer: Self-pay | Admitting: Internal Medicine

## 2024-03-05 NOTE — Telephone Encounter (Addendum)
 Patient is currently at [facility] healthcare facility, she missed her appointment today. Per Dr. Dennise if patient picc line is out no further follow up need. Picc end date was 6/30.  Waiting for a call back from facility to find out if patients picc line has been taken out.  Facility called back, patient picc line is out. Per Dr. Dennise no further follow up.

## 2024-03-06 ENCOUNTER — Inpatient Hospital Stay: Payer: Self-pay | Admitting: Internal Medicine

## 2024-03-07 ENCOUNTER — Encounter: Payer: Self-pay | Admitting: Medical Oncology

## 2024-03-11 ENCOUNTER — Other Ambulatory Visit

## 2024-03-11 ENCOUNTER — Other Ambulatory Visit: Payer: Self-pay

## 2024-03-13 ENCOUNTER — Encounter: Payer: Self-pay | Admitting: Oncology

## 2024-04-16 ENCOUNTER — Encounter (HOSPITAL_COMMUNITY): Payer: Self-pay | Admitting: Emergency Medicine

## 2024-04-16 ENCOUNTER — Other Ambulatory Visit: Payer: Self-pay

## 2024-04-16 ENCOUNTER — Emergency Department (HOSPITAL_COMMUNITY)
Admission: EM | Admit: 2024-04-16 | Discharge: 2024-04-17 | Disposition: A | Discharge status: Home / Self Care | Attending: Emergency Medicine | Admitting: Emergency Medicine

## 2024-04-16 DIAGNOSIS — R1030 Lower abdominal pain, unspecified: Secondary | ICD-10-CM | POA: Diagnosis present

## 2024-04-16 DIAGNOSIS — F0390 Unspecified dementia without behavioral disturbance: Secondary | ICD-10-CM | POA: Insufficient documentation

## 2024-04-16 DIAGNOSIS — Z8616 Personal history of COVID-19: Secondary | ICD-10-CM | POA: Diagnosis not present

## 2024-04-16 DIAGNOSIS — R410 Disorientation, unspecified: Secondary | ICD-10-CM | POA: Diagnosis not present

## 2024-04-16 DIAGNOSIS — Z7902 Long term (current) use of antithrombotics/antiplatelets: Secondary | ICD-10-CM | POA: Diagnosis not present

## 2024-04-16 DIAGNOSIS — N3001 Acute cystitis with hematuria: Secondary | ICD-10-CM | POA: Diagnosis not present

## 2024-04-16 DIAGNOSIS — Z79899 Other long term (current) drug therapy: Secondary | ICD-10-CM | POA: Diagnosis not present

## 2024-04-16 DIAGNOSIS — R531 Weakness: Secondary | ICD-10-CM | POA: Insufficient documentation

## 2024-04-16 LAB — URINALYSIS, ROUTINE W REFLEX MICROSCOPIC
Bilirubin Urine: NEGATIVE
Glucose, UA: NEGATIVE mg/dL
Ketones, ur: 5 mg/dL — AB
Nitrite: NEGATIVE
Protein, ur: >=300 mg/dL — AB
Specific Gravity, Urine: 1.013 (ref 1.005–1.030)
WBC, UA: >50 WBC/hpf (ref 0–5)
pH: 6 (ref 5.0–8.0)

## 2024-04-16 LAB — CBC WITH DIFFERENTIAL/PLATELET
Abs Immature Granulocytes: 0.12 K/uL — ABNORMAL HIGH (ref 0.00–0.07)
Basophils Absolute: 0.1 K/uL (ref 0.0–0.1)
Basophils Relative: 1 %
Eosinophils Absolute: 0.3 K/uL (ref 0.0–0.5)
Eosinophils Relative: 3 %
HCT: 39.6 % (ref 36.0–46.0)
Hemoglobin: 11.9 g/dL — ABNORMAL LOW (ref 12.0–15.0)
Immature Granulocytes: 1 %
Lymphocytes Relative: 22 %
Lymphs Abs: 2.7 K/uL (ref 0.7–4.0)
MCH: 28.7 pg (ref 26.0–34.0)
MCHC: 30.1 g/dL (ref 30.0–36.0)
MCV: 95.7 fL (ref 80.0–100.0)
Monocytes Absolute: 1 K/uL (ref 0.1–1.0)
Monocytes Relative: 8 %
Neutro Abs: 7.7 K/uL (ref 1.7–7.7)
Neutrophils Relative %: 65 %
Platelets: 318 K/uL (ref 150–400)
RBC: 4.14 MIL/uL (ref 3.87–5.11)
RDW: 13.8 % (ref 11.5–15.5)
WBC: 11.9 K/uL — ABNORMAL HIGH (ref 4.0–10.5)
nRBC: 0 % (ref 0.0–0.2)

## 2024-04-16 LAB — COMPREHENSIVE METABOLIC PANEL WITH GFR
ALT: 30 U/L (ref 0–44)
AST: 31 U/L (ref 15–41)
Albumin: 3.7 g/dL (ref 3.5–5.0)
Alkaline Phosphatase: 140 U/L — ABNORMAL HIGH (ref 38–126)
Anion gap: 10 (ref 5–15)
BUN: 28 mg/dL — ABNORMAL HIGH (ref 8–23)
CO2: 18 mmol/L — ABNORMAL LOW (ref 22–32)
Calcium: 11 mg/dL — ABNORMAL HIGH (ref 8.9–10.3)
Chloride: 109 mmol/L (ref 98–111)
Creatinine, Ser: 1.21 mg/dL — ABNORMAL HIGH (ref 0.44–1.00)
GFR, Estimated: 47 mL/min — ABNORMAL LOW (ref >60)
Glucose, Bld: 91 mg/dL (ref 70–99)
Potassium: 3.8 mmol/L (ref 3.5–5.1)
Sodium: 137 mmol/L (ref 135–145)
Total Bilirubin: 0.4 mg/dL (ref 0.0–1.2)
Total Protein: 8.5 g/dL — ABNORMAL HIGH (ref 6.5–8.1)

## 2024-04-16 LAB — RESP PANEL BY RT-PCR (RSV, FLU A&B, COVID)  RVPGX2
Influenza A by PCR: NEGATIVE
Influenza B by PCR: NEGATIVE
Resp Syncytial Virus by PCR: NEGATIVE
SARS Coronavirus 2 by RT PCR: NEGATIVE

## 2024-04-16 LAB — LIPASE, BLOOD: Lipase: 36 U/L (ref 11–51)

## 2024-04-16 MED ORDER — SODIUM CHLORIDE 0.9 % IV SOLN
1.0000 g | Freq: Once | INTRAVENOUS | Status: AC
  Administered 2024-04-16: 1 g via INTRAVENOUS
  Filled 2024-04-16: qty 10

## 2024-04-16 MED ORDER — CEPHALEXIN 500 MG PO CAPS
500.0000 mg | ORAL_CAPSULE | Freq: Three times a day (TID) | ORAL | 0 refills | Status: DC
Start: 2024-04-17 — End: 2024-04-23
  Filled 2024-04-16: qty 18, 6d supply, fill #0

## 2024-04-16 NOTE — ED Triage Notes (Signed)
 Pt BIB EMS from [facility] with c/o abdominal pain and generalized weakness. Tested for covid, negative.

## 2024-04-16 NOTE — ED Provider Notes (Signed)
 [place] EMERGENCY DEPARTMENT AT [facility] Provider Note   CSN: [id number] Arrival date & time: 04/16/24  2024     Patient presents with: Abdominal Pain   Heidi Blevins is a 73 y.o. female with a history of schizophrenia, recurring UTIs, presenting from a nursing facility with concern for generalized weakness and confusion.  Her son at the bedside reports that the patient's roommate had COVID last week.  The patient has not been herself for 2 or 3 days, she is sleeping all the time.  She was complaining of lower abdominal and back pain today, although she chronically complains of back pain due to scoliosis and problems with her spine.  She has a history of UTIs and her son is concerned she may have a urinary infection.  Patient is a level 5 caveat due to dementia on exam, appears pleasantly confused but denies to me any abdominal pain   HPI     Prior to Admission medications   Medication Sig Start Date End Date Taking? Authorizing Provider  cephALEXin  (KEFLEX ) 500 MG capsule Take 1 capsule (500 mg total) by mouth 3 (three) times daily for 6 days. 04/17/24 04/23/24 Yes Ardit Danh, Donnice PARAS, MD  acetaminophen  (TYLENOL ) 500 MG tablet Take 1,000 mg by mouth in the morning and at bedtime.    [provider]  atorvastatin  (LIPITOR) 40 MG tablet Take 1 tablet (40 mg total) by mouth daily. 02/22/24   Vernon Ranks, MD  benztropine  (COGENTIN ) 1 MG tablet Take 1 mg by mouth 2 (two) times daily.    [provider]  clopidogrel  (PLAVIX ) 75 MG tablet Take 1 tablet (75 mg total) by mouth daily. 02/18/24   Vernon Ranks, MD  Cranberry 425 MG CAPS Take 425 mg by mouth 2 (two) times a day.    [provider]  estradiol (ESTRACE) 0.1 MG/GM vaginal cream Place 1 Applicatorful vaginally. TWICE PER WEEK 01/29/24   [provider]  ferrous sulfate (FEROSUL) 325 (65 FE) MG tablet Take 325 mg by mouth daily with breakfast.    [provider]   fluPHENAZine  (PROLIXIN ) 10 MG tablet Take 20 mg by mouth in the morning and at bedtime.    [provider]  ibuprofen  (ADVIL ) 600 MG tablet Take 600 mg by mouth 2 (two) times daily as needed. 06/22/23   [provider]  lidocaine  (LIDODERM ) 5 % Place 1-3 patches onto the skin daily. NEED SITE(S) SPECIFIED, FREQUENCY CONFIRMED AND NEED PARAMETERS FOR NUMBER OF PATCHES 12 HRS ON 12 HRS OFF    [provider]  lithium  carbonate 150 MG capsule Take 1 capsule (150 mg total) by mouth at bedtime. 04/25/22 02/10/24  Christobal Guadalajara, MD  loratadine  (CLARITIN ) 10 MG tablet Take 10 mg by mouth daily.    [provider]  LORazepam  (ATIVAN ) 0.5 MG tablet Take 1 tablet (0.5 mg total) by mouth in the morning, at noon, in the evening, and at bedtime. 02/15/24   Pahwani, Ravi, MD  Lurasidone  HCl (LATUDA ) 120 MG TABS Take 120 mg by mouth daily.    [provider]  melatonin 5 MG TABS Take 5 mg by mouth at bedtime.    [provider]  Menthol , Topical Analgesic, (BENGAY VANISHING SCENT) 2.5 % GEL Apply topically 3 (three) times daily as needed (BACK PAIN). APPLY TO LOWER BACK THREE TIMES DAILY AS NEEDED FOR BACK PAIN    [provider]  metoprolol  tartrate (LOPRESSOR ) 25 MG tablet Take 25 mg by mouth 2 (  two) times daily. 06/13/23   [provider]  Multiple Vitamins-Minerals (MULTIVITAMINS THER. W/MINERALS) TABS tablet Take 1 tablet by mouth daily.    [provider]  polyethylene glycol powder (GLYCOLAX /MIRALAX ) 17 GM/SCOOP powder Take 17 g by mouth daily.    [provider]  Pramoxine HCl (VAGISIL ANTI-ITCH MEDICATED) 1 % MISC Apply vaginally once a day Apply 1 application topically daily as needed (for vaginal itching). Apply vaginally once a day Patient taking differently: Apply 1 application  topically daily as needed (for vaginal itching). 03/25/19   Sebastian Toribio GAILS, MD  Propylene Glycol (SYSTANE BALANCE OP) Place 1 drop into both  eyes 3 (three) times daily.    [provider]  sertraline  (ZOLOFT ) 100 MG tablet Take 150 mg by mouth daily.    [provider]  simethicone  (MYLICON) 80 MG chewable tablet Chew 1 tablet (80 mg total) by mouth 4 (four) times daily. 08/13/20   Tobie Yetta HERO, MD    Allergies: Sulfonamide derivatives    Review of Systems  Updated Vital Signs BP (!) 147/102   Pulse 71   Temp 98 F (36.7 C) (Oral)   Resp 18   SpO2 99%   Physical Exam Constitutional:      General: She is not in acute distress. HENT:     Head: Normocephalic and atraumatic.  Eyes:     Conjunctiva/sclera: Conjunctivae normal.     Pupils: Pupils are equal, round, and reactive to light.  Cardiovascular:     Rate and Rhythm: Normal rate and regular rhythm.  Pulmonary:     Effort: Pulmonary effort is normal. No respiratory distress.  Abdominal:     General: There is no distension.     Tenderness: There is abdominal tenderness in the suprapubic area. There is no guarding or rebound. Negative signs include Murphy's sign.  Skin:    General: Skin is warm and dry.  Neurological:     General: No focal deficit present.     Mental Status: She is alert. Mental status is at baseline.  Psychiatric:        Mood and Affect: Mood normal.        Behavior: Behavior normal.     (all labs ordered are listed, but only abnormal results are displayed) Labs Reviewed  COMPREHENSIVE METABOLIC PANEL WITH GFR - Abnormal; Notable for the following components:      Result Value   CO2 18 (*)    BUN 28 (*)    Creatinine, Ser 1.21 (*)    Calcium  11.0 (*)    Total Protein 8.5 (*)    Alkaline Phosphatase 140 (*)    GFR, Estimated 47 (*)    All other components within normal limits  CBC WITH DIFFERENTIAL/PLATELET - Abnormal; Notable for the following components:   WBC 11.9 (*)    Hemoglobin 11.9 (*)    Abs Immature Granulocytes 0.12 (*)    All other components within normal limits  URINALYSIS, ROUTINE W REFLEX  MICROSCOPIC - Abnormal; Notable for the following components:   APPearance TURBID (*)    Hgb urine dipstick MODERATE (*)    Ketones, ur 5 (*)    Protein, ur >=300 (*)    Leukocytes,Ua MODERATE (*)    Bacteria, UA MANY (*)    All other components within normal limits  RESP PANEL BY RT-PCR (RSV, FLU A&B, COVID)  [id number]  URINE CULTURE  LIPASE, BLOOD    EKG: None  Radiology: No results found.   Procedures  Medications Ordered in the ED  cefTRIAXone  (ROCEPHIN ) 1 g in sodium chloride  0.9 % 100 mL IVPB (1 g Intravenous New Bag/Given 04/16/24 2321)    Clinical Course as of 04/16/24 2337  Wed Apr 16, 2024  2306 Son updated regarding UTI and plan to treat, likely discharge back to facility (will need PTAR as she does not ambulate) [MT]    Clinical Course User Index [MT] Masie Bermingham, Donnice PARAS, MD                                 Medical Decision Making Amount and/or Complexity of Data Reviewed Labs: ordered.  Risk Prescription drug management.   This patient presents to the ED with concern for abdominal pain. This involves an extensive number of treatment options, and is a complaint that carries with it a high risk of complications and morbidity.  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  I ordered and personally interpreted labs.  The pertinent results include: UA consistent with infection.  Remaining labs are baseline levels  I ordered medication including IV  Rocephin  for UTI  I have reviewed the patients home medicines and have made adjustments as needed  Test Considered: No indication for CT imaging of the abdomen at this time.  Overall benign abdominal exam with clear source of infection and discomfort - cystitis  After the interventions noted above, I reevaluated the patient and found that they have: improved  Patient is a level 5 caveat due to dementia.  Her son was updated by phone regarding the workup and plan for discharge  Disposition:  After consideration of the diagnostic results and the patient's response to treatment, I feel that the patient would benefit from outpatient follow-up.      Final diagnoses:  Acute cystitis with hematuria    ED Discharge Orders          Ordered    cephALEXin  (KEFLEX ) 500 MG capsule  3 times daily        04/16/24 2318               Cottie Donnice PARAS, MD 04/16/24 2337

## 2024-04-16 NOTE — Discharge Instructions (Signed)
 Heidi Blevins was diagnosed with urinary tract infection in the ER today.  She was given IV antibiotics in the ER.  Prescription was sent to her pharmacy for 6 more days of oral antibiotics, Keflex .

## 2024-04-17 ENCOUNTER — Other Ambulatory Visit (HOSPITAL_COMMUNITY): Payer: Self-pay

## 2024-04-17 NOTE — ED Notes (Signed)
 Called for PTAR transport to [facility]

## 2024-04-18 ENCOUNTER — Other Ambulatory Visit (HOSPITAL_COMMUNITY): Payer: Self-pay

## 2024-04-19 LAB — URINE CULTURE: Culture: >=100000 — AB

## 2024-04-20 ENCOUNTER — Encounter (HOSPITAL_COMMUNITY): Payer: Self-pay

## 2024-04-20 ENCOUNTER — Inpatient Hospital Stay (HOSPITAL_COMMUNITY)
Admission: EM | Admit: 2024-04-20 | Discharge: 2024-04-23 | DRG: 689 | Disposition: A | Discharge status: Skilled Nursing Facility | Source: Skilled Nursing Facility | Attending: Internal Medicine | Admitting: Internal Medicine

## 2024-04-20 ENCOUNTER — Emergency Department (HOSPITAL_COMMUNITY)

## 2024-04-20 ENCOUNTER — Other Ambulatory Visit: Payer: Self-pay

## 2024-04-20 ENCOUNTER — Telehealth (HOSPITAL_BASED_OUTPATIENT_CLINIC_OR_DEPARTMENT_OTHER): Payer: Self-pay | Admitting: *Deleted

## 2024-04-20 DIAGNOSIS — N289 Disorder of kidney and ureter, unspecified: Secondary | ICD-10-CM | POA: Diagnosis not present

## 2024-04-20 DIAGNOSIS — Z8249 Family history of ischemic heart disease and other diseases of the circulatory system: Secondary | ICD-10-CM | POA: Diagnosis not present

## 2024-04-20 DIAGNOSIS — Z66 Do not resuscitate: Secondary | ICD-10-CM | POA: Diagnosis present

## 2024-04-20 DIAGNOSIS — I5032 Chronic diastolic (congestive) heart failure: Secondary | ICD-10-CM | POA: Diagnosis present

## 2024-04-20 DIAGNOSIS — F17210 Nicotine dependence, cigarettes, uncomplicated: Secondary | ICD-10-CM | POA: Diagnosis present

## 2024-04-20 DIAGNOSIS — F0394 Unspecified dementia, unspecified severity, with anxiety: Secondary | ICD-10-CM | POA: Diagnosis present

## 2024-04-20 DIAGNOSIS — Z681 Body mass index (BMI) 19 or less, adult: Secondary | ICD-10-CM

## 2024-04-20 DIAGNOSIS — M419 Scoliosis, unspecified: Secondary | ICD-10-CM | POA: Diagnosis present

## 2024-04-20 DIAGNOSIS — B9689 Other specified bacterial agents as the cause of diseases classified elsewhere: Secondary | ICD-10-CM | POA: Diagnosis present

## 2024-04-20 DIAGNOSIS — F32A Depression, unspecified: Secondary | ICD-10-CM | POA: Diagnosis present

## 2024-04-20 DIAGNOSIS — Z1612 Extended spectrum beta lactamase (ESBL) resistance: Secondary | ICD-10-CM | POA: Diagnosis present

## 2024-04-20 DIAGNOSIS — B961 Klebsiella pneumoniae [K. pneumoniae] as the cause of diseases classified elsewhere: Secondary | ICD-10-CM | POA: Diagnosis present

## 2024-04-20 DIAGNOSIS — Z8673 Personal history of transient ischemic attack (TIA), and cerebral infarction without residual deficits: Secondary | ICD-10-CM

## 2024-04-20 DIAGNOSIS — Z881 Allergy status to other antibiotic agents status: Secondary | ICD-10-CM | POA: Diagnosis not present

## 2024-04-20 DIAGNOSIS — Z882 Allergy status to sulfonamides status: Secondary | ICD-10-CM

## 2024-04-20 DIAGNOSIS — R5383 Other fatigue: Secondary | ICD-10-CM | POA: Diagnosis not present

## 2024-04-20 DIAGNOSIS — Z79899 Other long term (current) drug therapy: Secondary | ICD-10-CM

## 2024-04-20 DIAGNOSIS — Z7902 Long term (current) use of antithrombotics/antiplatelets: Secondary | ICD-10-CM

## 2024-04-20 DIAGNOSIS — F259 Schizoaffective disorder, unspecified: Secondary | ICD-10-CM | POA: Diagnosis present

## 2024-04-20 DIAGNOSIS — Z823 Family history of stroke: Secondary | ICD-10-CM

## 2024-04-20 DIAGNOSIS — R519 Headache, unspecified: Secondary | ICD-10-CM | POA: Diagnosis not present

## 2024-04-20 DIAGNOSIS — I10 Essential (primary) hypertension: Secondary | ICD-10-CM | POA: Diagnosis present

## 2024-04-20 DIAGNOSIS — I130 Hypertensive heart and chronic kidney disease with heart failure and stage 1 through stage 4 chronic kidney disease, or unspecified chronic kidney disease: Secondary | ICD-10-CM | POA: Diagnosis present

## 2024-04-20 DIAGNOSIS — N390 Urinary tract infection, site not specified: Secondary | ICD-10-CM

## 2024-04-20 DIAGNOSIS — F411 Generalized anxiety disorder: Secondary | ICD-10-CM | POA: Diagnosis present

## 2024-04-20 DIAGNOSIS — N3001 Acute cystitis with hematuria: Principal | ICD-10-CM | POA: Diagnosis present

## 2024-04-20 DIAGNOSIS — Z1629 Resistance to other single specified antibiotic: Secondary | ICD-10-CM | POA: Diagnosis present

## 2024-04-20 DIAGNOSIS — E43 Unspecified severe protein-calorie malnutrition: Secondary | ICD-10-CM | POA: Diagnosis present

## 2024-04-20 DIAGNOSIS — N1831 Chronic kidney disease, stage 3a: Secondary | ICD-10-CM | POA: Diagnosis present

## 2024-04-20 DIAGNOSIS — G4700 Insomnia, unspecified: Secondary | ICD-10-CM | POA: Diagnosis present

## 2024-04-20 DIAGNOSIS — N3090 Cystitis, unspecified without hematuria: Secondary | ICD-10-CM | POA: Diagnosis not present

## 2024-04-20 DIAGNOSIS — Z96642 Presence of left artificial hip joint: Secondary | ICD-10-CM | POA: Diagnosis present

## 2024-04-20 DIAGNOSIS — Z1624 Resistance to multiple antibiotics: Secondary | ICD-10-CM | POA: Diagnosis present

## 2024-04-20 DIAGNOSIS — Z8269 Family history of other diseases of the musculoskeletal system and connective tissue: Secondary | ICD-10-CM

## 2024-04-20 LAB — URINALYSIS, ROUTINE W REFLEX MICROSCOPIC
Glucose, UA: NEGATIVE mg/dL
Nitrite: POSITIVE — AB
Specific Gravity, Urine: 1.01 (ref 1.005–1.030)
pH: 6 (ref 5.0–8.0)

## 2024-04-20 LAB — CBC WITH DIFFERENTIAL/PLATELET
Abs Immature Granulocytes: 0.22 K/uL — ABNORMAL HIGH (ref 0.00–0.07)
Basophils Absolute: 0.1 K/uL (ref 0.0–0.1)
Basophils Relative: 1 %
Eosinophils Absolute: 0.3 K/uL (ref 0.0–0.5)
Eosinophils Relative: 3 %
HCT: 36.9 % (ref 36.0–46.0)
Hemoglobin: 11.5 g/dL — ABNORMAL LOW (ref 12.0–15.0)
Immature Granulocytes: 2 %
Lymphocytes Relative: 24 %
Lymphs Abs: 2.2 K/uL (ref 0.7–4.0)
MCH: 29.3 pg (ref 26.0–34.0)
MCHC: 31.2 g/dL (ref 30.0–36.0)
MCV: 94.1 fL (ref 80.0–100.0)
Monocytes Absolute: 0.7 K/uL (ref 0.1–1.0)
Monocytes Relative: 8 %
Neutro Abs: 5.6 K/uL (ref 1.7–7.7)
Neutrophils Relative %: 62 %
Platelets: 317 K/uL (ref 150–400)
RBC: 3.92 MIL/uL (ref 3.87–5.11)
RDW: 13.5 % (ref 11.5–15.5)
WBC: 9.1 K/uL (ref 4.0–10.5)
nRBC: 0 % (ref 0.0–0.2)

## 2024-04-20 LAB — BASIC METABOLIC PANEL WITH GFR
Anion gap: 11 (ref 5–15)
BUN: 17 mg/dL (ref 8–23)
CO2: 19 mmol/L — ABNORMAL LOW (ref 22–32)
Calcium: 10.2 mg/dL (ref 8.9–10.3)
Chloride: 107 mmol/L (ref 98–111)
Creatinine, Ser: 1.25 mg/dL — ABNORMAL HIGH (ref 0.44–1.00)
GFR, Estimated: 46 mL/min — ABNORMAL LOW (ref >60)
Glucose, Bld: 83 mg/dL (ref 70–99)
Potassium: 4.1 mmol/L (ref 3.5–5.1)
Sodium: 137 mmol/L (ref 135–145)

## 2024-04-20 LAB — URINALYSIS, MICROSCOPIC (REFLEX)

## 2024-04-20 MED ORDER — ACETAMINOPHEN 650 MG RE SUPP: 650.0000 mg | Freq: Four times a day (QID) | RECTAL | Status: DC | PRN

## 2024-04-20 MED ORDER — METOPROLOL TARTRATE 25 MG PO TABS
25.0000 mg | ORAL_TABLET | Freq: Two times a day (BID) | ORAL | Status: DC
  Administered 2024-04-21 (×2): 25 mg via ORAL
  Filled 2024-04-20 (×4): qty 1

## 2024-04-20 MED ORDER — ACETAMINOPHEN 325 MG PO TABS
650.0000 mg | ORAL_TABLET | Freq: Four times a day (QID) | ORAL | Status: DC | PRN
  Administered 2024-04-23: 650 mg via ORAL
  Filled 2024-04-20: qty 2

## 2024-04-20 MED ORDER — BENZTROPINE MESYLATE 1 MG PO TABS
1.0000 mg | ORAL_TABLET | Freq: Two times a day (BID) | ORAL | Status: DC
Start: 2024-04-21 — End: 2024-04-24
  Administered 2024-04-21 – 2024-04-23 (×5): 1 mg via ORAL
  Filled 2024-04-20 (×6): qty 1

## 2024-04-20 MED ORDER — SODIUM CHLORIDE 0.9 % IV SOLN
1.0000 g | Freq: Once | INTRAVENOUS | Status: DC
  Filled 2024-04-20: qty 20

## 2024-04-20 MED ORDER — SIMETHICONE 80 MG PO CHEW
80.0000 mg | CHEWABLE_TABLET | Freq: Four times a day (QID) | ORAL | Status: DC
  Administered 2024-04-21 – 2024-04-23 (×8): 80 mg via ORAL
  Filled 2024-04-20 (×9): qty 1

## 2024-04-20 MED ORDER — LITHIUM CARBONATE 150 MG PO CAPS
150.0000 mg | ORAL_CAPSULE | Freq: Every day | ORAL | Status: DC
Start: 2024-04-20 — End: 2024-04-24
  Administered 2024-04-21 – 2024-04-22 (×2): 150 mg via ORAL
  Filled 2024-04-20 (×4): qty 1

## 2024-04-20 MED ORDER — SODIUM CHLORIDE 0.9 % IV SOLN
1.0000 g | Freq: Two times a day (BID) | INTRAVENOUS | Status: AC
  Administered 2024-04-20 – 2024-04-23 (×6): 1 g via INTRAVENOUS
  Filled 2024-04-20 (×6): qty 20

## 2024-04-20 MED ORDER — LORAZEPAM 0.5 MG PO TABS: 0.5000 mg | ORAL_TABLET | Freq: Four times a day (QID) | ORAL | Status: DC

## 2024-04-20 MED ORDER — POLYETHYLENE GLYCOL 3350 17 G PO PACK
17.0000 g | PACK | Freq: Every day | ORAL | Status: DC
  Administered 2024-04-21 – 2024-04-23 (×3): 17 g via ORAL
  Filled 2024-04-20 (×3): qty 1

## 2024-04-20 MED ORDER — MELATONIN 5 MG PO TABS
5.0000 mg | ORAL_TABLET | Freq: Every day | ORAL | Status: DC
  Administered 2024-04-21 – 2024-04-22 (×2): 5 mg via ORAL
  Filled 2024-04-20 (×2): qty 1

## 2024-04-20 MED ORDER — MENTHOL (TOPICAL ANALGESIC) 2.5 % EX GEL: Freq: Three times a day (TID) | CUTANEOUS | Status: DC | PRN

## 2024-04-20 MED ORDER — ENOXAPARIN SODIUM 40 MG/0.4ML IJ SOSY: 40.0000 mg | PREFILLED_SYRINGE | INTRAMUSCULAR | Status: DC

## 2024-04-20 MED ORDER — FLUPHENAZINE HCL 5 MG PO TABS
20.0000 mg | ORAL_TABLET | Freq: Two times a day (BID) | ORAL | Status: DC
Start: 2024-04-21 — End: 2024-04-24
  Administered 2024-04-21 – 2024-04-23 (×5): 20 mg via ORAL
  Filled 2024-04-20 (×6): qty 4

## 2024-04-20 MED ORDER — LURASIDONE HCL 40 MG PO TABS
120.0000 mg | ORAL_TABLET | Freq: Every day | ORAL | Status: DC
  Administered 2024-04-21 – 2024-04-23 (×3): 120 mg via ORAL
  Filled 2024-04-20 (×3): qty 3

## 2024-04-20 MED ORDER — LIDOCAINE 5 % EX PTCH
1.0000 | MEDICATED_PATCH | CUTANEOUS | Status: DC
  Administered 2024-04-21 – 2024-04-23 (×3): 1 via TRANSDERMAL
  Filled 2024-04-20 (×3): qty 1

## 2024-04-20 MED ORDER — POLYVINYL ALCOHOL 1.4 % OP SOLN
1.0000 [drp] | Freq: Three times a day (TID) | OPHTHALMIC | Status: DC
  Administered 2024-04-21 – 2024-04-23 (×8): 1 [drp] via OPHTHALMIC
  Filled 2024-04-20: qty 15

## 2024-04-20 MED ORDER — ONDANSETRON HCL 4 MG/2ML IJ SOLN: 4.0000 mg | Freq: Four times a day (QID) | INTRAMUSCULAR | Status: DC | PRN

## 2024-04-20 MED ORDER — ATORVASTATIN CALCIUM 40 MG PO TABS
40.0000 mg | ORAL_TABLET | Freq: Every day | ORAL | Status: DC
  Administered 2024-04-21 – 2024-04-23 (×3): 40 mg via ORAL
  Filled 2024-04-20 (×3): qty 1

## 2024-04-20 MED ORDER — SERTRALINE HCL 50 MG PO TABS
150.0000 mg | ORAL_TABLET | Freq: Every day | ORAL | Status: DC
  Administered 2024-04-21 – 2024-04-23 (×3): 150 mg via ORAL
  Filled 2024-04-20 (×3): qty 1

## 2024-04-20 MED ORDER — SENNOSIDES-DOCUSATE SODIUM 8.6-50 MG PO TABS
1.0000 | ORAL_TABLET | Freq: Every evening | ORAL | Status: DC | PRN
Start: 2024-04-20 — End: 2024-04-24
  Administered 2024-04-22: 1 via ORAL
  Filled 2024-04-20: qty 1

## 2024-04-20 MED ORDER — CLOPIDOGREL BISULFATE 75 MG PO TABS
75.0000 mg | ORAL_TABLET | Freq: Every day | ORAL | Status: DC
  Administered 2024-04-21 – 2024-04-23 (×3): 75 mg via ORAL
  Filled 2024-04-20 (×3): qty 1

## 2024-04-20 NOTE — Progress Notes (Signed)
 Pharmacy Antibiotic Note  Heidi Blevins is a 73 y.o. female admitted on 04/20/2024 presenting from nursing facility with recent UTI and UCx results positive for ESBL Klebsiella (carbapenem sens.  Pharmacy has been consulted for Merrem  dosing.  Plan: Merrem  1g IV q 12h Monitor renal function, LOT  Height: 5' (152.4 cm) Weight: 50 kg (110 lb 3.7 oz) IBW/kg (Calculated) : 45.5  Temp (24hrs), Avg:98.7 F (37.1 C), Min:98.7 F (37.1 C), Max:98.7 F (37.1 C)  Recent Labs  Lab 04/16/24 2252 04/20/24 1656  WBC 11.9* 9.1  CREATININE 1.21* 1.25*    Estimated Creatinine Clearance: 28.8 mL/min (A) (by C-G formula based on SCr of 1.25 mg/dL (H)).    Allergies  Allergen Reactions   Sulfonamide Derivatives Hives         Bracen Schum, PharmD, [facility] Clinical Pharmacist ED Pharmacist Phone # [phone] 04/20/2024 7:59 PM

## 2024-04-20 NOTE — H&P (Signed)
 History and Physical    Heidi Blevins FMW:[id number] DOB: 19-May-1951 DOA: 04/20/2024  PCP: Pcp, No   Patient coming from: SNF   Chief Complaint: Lethargic, UTI d/t ESBL Klebsiella   HPI: Heidi Blevins is a 73 y.o. female with medical history significant for hypertension, history of CVA, depression, anxiety, schizoaffective disorder, and CKD 3A who was seen in the ED on 04/16/2024 for lethargy, was diagnosed with UTI and discharged back to her SNF with Keflex  at that time, but now returns due to urine culture growing ESBL Klebsiella.  Patient has remained lethargic at her nursing facility with no improvement in her condition since starting Keflex .  She provides a limited history, denies abdominal pain or flank pain, complains of chronic midline back pain, and denies chills.  ED Course: Upon arrival to the ED, patient is found to be afebrile and saturating well on room air with normal HR and stable BP.  Labs are most notable for creatinine 1.25 and normal WBC.  Head CT is negative for acute findings.  Patient was started on meropenem  in the ED.  Review of Systems:  ROS limited by patient's clinical condition.  Past Medical History:  Diagnosis Date   Anxiety    Arrhythmia    Bifascicular block    Chronic diastolic heart failure (HCC)    Depression    Hypertension    Insomnia    Schizoaffective disorder    Scoliosis    Thyroid nodule 08/09/2020   Vitamin D  deficiency     Past Surgical History:  Procedure Laterality Date   BACK SURGERY     as a child after she fell out of a tree   TONSILLECTOMY     TOTAL HIP ARTHROPLASTY Left 09/27/2018   Procedure: LEFT TOTAL HIP ARTHROPLASTY ANTERIOR APPROACH;  Surgeon: Jerri Kay HERO, MD;  Location: [facility] OR;  Service: Orthopedics;  Laterality: Left;    Social History:   reports that she has been smoking cigarettes. She has a 10 pack-year smoking history. She has never used smokeless tobacco. She reports that she does not drink  alcohol  and does not use drugs.  Allergies  Allergen Reactions   Sulfonamide Derivatives Hives         Family History  Problem Relation Age of Onset   CVA Mother    Hypertension Mother    Lupus Sister    Hypertension Brother    Peptic Ulcer Disease Father    Colon cancer Neg Hx      Prior to Admission medications   Medication Sig Start Date End Date Taking? Authorizing Provider  acetaminophen  (TYLENOL ) 500 MG tablet Take 1,000 mg by mouth in the morning and at bedtime.    [provider]  atorvastatin  (LIPITOR) 40 MG tablet Take 1 tablet (40 mg total) by mouth daily. 02/22/24   Vernon Ranks, MD  benztropine  (COGENTIN ) 1 MG tablet Take 1 mg by mouth 2 (two) times daily.    [provider]  cephALEXin  (KEFLEX ) 500 MG capsule Take 1 capsule (500 mg total) by mouth 3 (three) times daily for 6 days. 04/17/24 04/24/24  Cottie Donnice PARAS, MD  clopidogrel  (PLAVIX ) 75 MG tablet Take 1 tablet (75 mg total) by mouth daily. 02/18/24   Vernon Ranks, MD  Cranberry 425 MG CAPS Take 425 mg by mouth 2 (two) times a day.    [provider]  estradiol (ESTRACE) 0.1 MG/GM vaginal cream Place 1 Applicatorful vaginally. TWICE PER WEEK 01/29/24   [provider]  ferrous sulfate (FEROSUL) 325 (65 FE) MG tablet Take 325 mg by mouth daily with breakfast.    [provider]  fluPHENAZine  (PROLIXIN ) 10 MG tablet Take 20 mg by mouth in the morning and at bedtime.    [provider]  ibuprofen  (ADVIL ) 600 MG tablet Take 600 mg by mouth 2 (two) times daily as needed. 06/22/23   [provider]  lidocaine  (LIDODERM ) 5 % Place 1-3 patches onto the skin daily. NEED SITE(S) SPECIFIED, FREQUENCY CONFIRMED AND NEED PARAMETERS FOR NUMBER OF PATCHES 12 HRS ON 12 HRS OFF    [provider]  lithium  carbonate 150 MG capsule Take 1 capsule (150 mg total) by mouth at bedtime. 04/25/22 02/10/24  Christobal Guadalajara, MD  loratadine  (CLARITIN ) 10 MG tablet Take 10 mg by  mouth daily.    [provider]  LORazepam  (ATIVAN ) 0.5 MG tablet Take 1 tablet (0.5 mg total) by mouth in the morning, at noon, in the evening, and at bedtime. 02/15/24   Pahwani, Ravi, MD  Lurasidone  HCl (LATUDA ) 120 MG TABS Take 120 mg by mouth daily.    [provider]  melatonin 5 MG TABS Take 5 mg by mouth at bedtime.    [provider]  Menthol , Topical Analgesic, (BENGAY VANISHING SCENT) 2.5 % GEL Apply topically 3 (three) times daily as needed (BACK PAIN). APPLY TO LOWER BACK THREE TIMES DAILY AS NEEDED FOR BACK PAIN    [provider]  metoprolol  tartrate (LOPRESSOR ) 25 MG tablet Take 25 mg by mouth 2 (two) times daily. 06/13/23   [provider]  Multiple Vitamins-Minerals (MULTIVITAMINS THER. W/MINERALS) TABS tablet Take 1 tablet by mouth daily.    [provider]  polyethylene glycol powder (GLYCOLAX /MIRALAX ) 17 GM/SCOOP powder Take 17 g by mouth daily.    [provider]  Pramoxine HCl (VAGISIL ANTI-ITCH MEDICATED) 1 % MISC Apply vaginally once a day Apply 1 application topically daily as needed (for vaginal itching). Apply vaginally once a day Patient taking differently: Apply 1 application  topically daily as needed (for vaginal itching). 03/25/19   Sebastian Toribio GAILS, MD  Propylene Glycol (SYSTANE BALANCE OP) Place 1 drop into both eyes 3 (three) times daily.    [provider]  sertraline  (ZOLOFT ) 100 MG tablet Take 150 mg by mouth daily.    [provider]  simethicone  (MYLICON) 80 MG chewable tablet Chew 1 tablet (80 mg total) by mouth 4 (four) times daily. 08/13/20   Tobie Yetta HERO, MD    Physical Exam: Vitals:   04/20/24 1645 04/20/24 1649 04/20/24 1930  BP: 117/73  115/69  Pulse: (!) 58  63  Resp: 16  20  Temp: 98.7 F (37.1 C)    SpO2: 100%  97%  Weight:  50 kg   Height:  5' (1.524 m)     Constitutional: NAD, no pallor or diaphoresis   Eyes: PERTLA, lids and conjunctivae normal ENMT:  Mucous membranes are moist. Posterior pharynx clear of any exudate or lesions.   Neck: supple, no masses  Respiratory: no wheezing, no crackles. No accessory muscle use.  Cardiovascular: S1 & S2 heard, regular rate and rhythm. No extremity edema.  Abdomen: No tenderness, soft. Bowel sounds active.  Musculoskeletal: no clubbing / cyanosis. No joint deformity upper and lower extremities.   Skin: no significant rashes, lesions, ulcers. Warm, dry, well-perfused. Neurologic: Mild dysarthria. Moving all extremities. Alert and oriented to person.  Psychiatric: Calm. Cooperative.    Labs and Imaging on Admission:  I have personally reviewed following labs and imaging studies  CBC: Recent Labs  Lab 04/16/24 2252 04/20/24 1656  WBC 11.9* 9.1  NEUTROABS 7.7 5.6  HGB 11.9* 11.5*  HCT 39.6 36.9  MCV 95.7 94.1  PLT 318 317   Basic Metabolic Panel: Recent Labs  Lab 04/16/24 2252 04/20/24 1656  NA 137 137  K 3.8 4.1  CL 109 107  CO2 18* 19*  GLUCOSE 91 83  BUN 28* 17  CREATININE 1.21* 1.25*  CALCIUM  11.0* 10.2   GFR: Estimated Creatinine Clearance: 28.8 mL/min (A) (by C-G formula based on SCr of 1.25 mg/dL (H)). Liver Function Tests: Recent Labs  Lab 04/16/24 2252  AST 31  ALT 30  ALKPHOS 140*  BILITOT 0.4  PROT 8.5*  ALBUMIN 3.7   Recent Labs  Lab 04/16/24 2252  LIPASE 36   No results for input(s): AMMONIA in the last 168 hours. Coagulation Profile: No results for input(s): INR, PROTIME in the last 168 hours. Cardiac Enzymes: No results for input(s): CKTOTAL, CKMB, CKMBINDEX, TROPONINI in the last 168 hours. BNP (last 3 results) No results for input(s): PROBNP in the last 8760 hours. HbA1C: No results for input(s): HGBA1C in the last 72 hours. CBG: No results for input(s): GLUCAP in the last 168 hours. Lipid Profile: No results for input(s): CHOL, HDL, LDLCALC, TRIG, CHOLHDL, LDLDIRECT in the last 72 hours. Thyroid Function  Tests: No results for input(s): TSH, T4TOTAL, FREET4, T3FREE, THYROIDAB in the last 72 hours. Anemia Panel: No results for input(s): VITAMINB12, FOLATE, FERRITIN, TIBC, IRON, RETICCTPCT in the last 72 hours. Urine analysis:    Component Value Date/Time   COLORURINE YELLOW 04/20/2024 1657   APPEARANCEUR CLEAR 04/20/2024 1657   LABSPEC 1.010 04/20/2024 1657   PHURINE 6.0 04/20/2024 1657   GLUCOSEU NEGATIVE 04/20/2024 1657   HGBUR TRACE (A) 04/20/2024 1657   BILIRUBINUR SMALL (A) 04/20/2024 1657   KETONESUR TRACE (A) 04/20/2024 1657   PROTEINUR TRACE (A) 04/20/2024 1657   UROBILINOGEN 1.0 08/05/2011 1751   NITRITE POSITIVE (A) 04/20/2024 1657   LEUKOCYTESUR MODERATE (A) 04/20/2024 1657   Sepsis Labs: @LABRCNTIP (procalcitonin:4,lacticidven:4) ) Recent Results (from the past 240 hours)  Resp panel by RT-PCR (RSV, Flu A&B, Covid) Urine, Clean Catch     Status: None   Collection Time: 04/16/24 10:04 PM   Specimen: Urine, Clean Catch; Nasal Swab  Result Value Ref Range Status   SARS Coronavirus 2 by RT PCR NEGATIVE NEGATIVE Final    Comment: (NOTE) SARS-CoV-2 target nucleic acids are NOT DETECTED.  The SARS-CoV-2 RNA is generally detectable in upper respiratory specimens during the acute phase of infection. The lowest concentration of SARS-CoV-2 viral copies this assay can detect is 138 copies/mL. A negative result does not preclude SARS-Cov-2 infection and should not be used as the sole basis for treatment or other patient management decisions. A negative result may occur with  improper specimen collection/handling, submission of specimen other than nasopharyngeal swab, presence of viral mutation(s) within the areas targeted by this assay, and inadequate number of viral copies(<138 copies/mL). A negative result must be combined with clinical observations, patient history, and epidemiological information. The expected result is Negative.  Fact Sheet for  Patients:  BloggerCourse.com  Fact Sheet for Healthcare Providers:  SeriousBroker.it  This test is no t yet approved or cleared by the [place]  FDA and  has been authorized for detection and/or diagnosis of SARS-CoV-2 by FDA under an Emergency Use Authorization (EUA). This EUA will remain  in effect (  meaning this test can be used) for the duration of the COVID-19 declaration under Section 564(b)(1) of the Act, 21 U.S.C.section 360bbb-3(b)(1), unless the authorization is terminated  or revoked sooner.       Influenza A by PCR NEGATIVE NEGATIVE Final   Influenza B by PCR NEGATIVE NEGATIVE Final    Comment: (NOTE) The Xpert Xpress SARS-CoV-2/FLU/RSV plus assay is intended as an aid in the diagnosis of influenza from Nasopharyngeal swab specimens and should not be used as a sole basis for treatment. Nasal washings and aspirates are unacceptable for Xpert Xpress SARS-CoV-2/FLU/RSV testing.  Fact Sheet for Patients: BloggerCourse.com  Fact Sheet for Healthcare Providers: SeriousBroker.it  This test is not yet approved or cleared by the [place]  FDA and has been authorized for detection and/or diagnosis of SARS-CoV-2 by FDA under an Emergency Use Authorization (EUA). This EUA will remain in effect (meaning this test can be used) for the duration of the COVID-19 declaration under Section 564(b)(1) of the Act, 21 U.S.C. section 360bbb-3(b)(1), unless the authorization is terminated or revoked.     Resp Syncytial Virus by PCR NEGATIVE NEGATIVE Final    Comment: (NOTE) Fact Sheet for Patients: BloggerCourse.com  Fact Sheet for Healthcare Providers: SeriousBroker.it  This test is not yet approved or cleared by the [place]  FDA and has been authorized for detection and/or diagnosis of SARS-CoV-2 by FDA under an Emergency  Use Authorization (EUA). This EUA will remain in effect (meaning this test can be used) for the duration of the COVID-19 declaration under Section 564(b)(1) of the Act, 21 U.S.C. section 360bbb-3(b)(1), unless the authorization is terminated or revoked.  Performed at [facility], [address], [place], [place] [address]   Urine Culture     Status: Abnormal   Collection Time: 04/16/24 10:04 PM   Specimen: Urine, Clean Catch  Result Value Ref Range Status   Specimen Description   Final    URINE, CLEAN CATCH Performed at [facility], [address], [place], [place] [address]    Special Requests   Final    NONE Performed at [facility], [address] [place], [place] [address]    Culture (A)  Final    >=100,000 COLONIES/mL KLEBSIELLA PNEUMONIAE Confirmed Extended Spectrum Beta-Lactamase Producer (ESBL).  In bloodstream infections from ESBL organisms, carbapenems are preferred over piperacillin/tazobactam. They are shown to have a lower risk of mortality. 60,000 COLONIES/mL AEROCOCCUS SPECIES Standardized susceptibility testing for this organism is not available. Performed at [facility], [address], [place], [place] [address]    Report Status 04/19/2024 FINAL  Final   Organism ID, Bacteria KLEBSIELLA PNEUMONIAE (A)  Final      Susceptibility   Klebsiella pneumoniae - MIC*    AMPICILLIN >=32 RESISTANT Resistant     CEFAZOLIN  (URINE) Value in next row Resistant      >=32 RESISTANTThis is a modified FDA-approved test that has been validated and its performance characteristics determined by the reporting laboratory.  This laboratory is certified under the Clinical Laboratory Improvement Amendments CLIA as qualified to perform high complexity clinical laboratory testing.    CEFEPIME  Value in next row Resistant      >=32 RESISTANTThis is a modified FDA-approved test that has been validated and its performance  characteristics determined by the reporting laboratory.  This laboratory is certified under the Clinical Laboratory Improvement Amendments CLIA as qualified to perform high complexity clinical laboratory testing.    ERTAPENEM  Value in next row  Sensitive      >=32 RESISTANTThis is a modified FDA-approved test that has been validated and its performance characteristics determined by the reporting laboratory.  This laboratory is certified under the Clinical Laboratory Improvement Amendments CLIA as qualified to perform high complexity clinical laboratory testing.    CEFTRIAXONE  Value in next row Resistant      >=32 RESISTANTThis is a modified FDA-approved test that has been validated and its performance characteristics determined by the reporting laboratory.  This laboratory is certified under the Clinical Laboratory Improvement Amendments CLIA as qualified to perform high complexity clinical laboratory testing.    CIPROFLOXACIN Value in next row Intermediate      >=32 RESISTANTThis is a modified FDA-approved test that has been validated and its performance characteristics determined by the reporting laboratory.  This laboratory is certified under the Clinical Laboratory Improvement Amendments CLIA as qualified to perform high complexity clinical laboratory testing.    GENTAMICIN Value in next row Resistant      >=32 RESISTANTThis is a modified FDA-approved test that has been validated and its performance characteristics determined by the reporting laboratory.  This laboratory is certified under the Clinical Laboratory Improvement Amendments CLIA as qualified to perform high complexity clinical laboratory testing.    NITROFURANTOIN Value in next row Resistant      >=32 RESISTANTThis is a modified FDA-approved test that has been validated and its performance characteristics determined by the reporting laboratory.  This laboratory is certified under the Clinical Laboratory Improvement Amendments CLIA as  qualified to perform high complexity clinical laboratory testing.    TRIMETH/SULFA Value in next row Sensitive      >=32 RESISTANTThis is a modified FDA-approved test that has been validated and its performance characteristics determined by the reporting laboratory.  This laboratory is certified under the Clinical Laboratory Improvement Amendments CLIA as qualified to perform high complexity clinical laboratory testing.    AMPICILLIN/SULBACTAM Value in next row Resistant      >=32 RESISTANTThis is a modified FDA-approved test that has been validated and its performance characteristics determined by the reporting laboratory.  This laboratory is certified under the Clinical Laboratory Improvement Amendments CLIA as qualified to perform high complexity clinical laboratory testing.    PIP/TAZO Value in next row Sensitive ug/mL     <=4 SENSITIVEThis is a modified FDA-approved test that has been validated and its performance characteristics determined by the reporting laboratory.  This laboratory is certified under the Clinical Laboratory Improvement Amendments CLIA as qualified to perform high complexity clinical laboratory testing.    MEROPENEM  Value in next row Sensitive      <=4 SENSITIVEThis is a modified FDA-approved test that has been validated and its performance characteristics determined by the reporting laboratory.  This laboratory is certified under the Clinical Laboratory Improvement Amendments CLIA as qualified to perform high complexity clinical laboratory testing.    * >=100,000 COLONIES/mL KLEBSIELLA PNEUMONIAE     Radiological Exams on Admission: CT Head Wo Contrast Result Date: 04/20/2024 CLINICAL DATA:  Mental status change, unknown cause Pt arrived via EMS from Facility [facility] Call back with positive urine culture from hospital visit last week. Current abx do not cover. Hx Dementia EXAM: CT HEAD WITHOUT CONTRAST TECHNIQUE: Contiguous axial images were obtained from the base of the skull  through the vertex without intravenous contrast. RADIATION DOSE REDUCTION: This exam was performed according to the departmental dose-optimization program which includes automated exposure control, adjustment of the mA and/or kV according to patient size and/or use of iterative  reconstruction technique. COMPARISON:  CT head 09/22/2023 FINDINGS: Brain: Patchy and confluent areas of decreased attenuation are noted throughout the deep and periventricular white matter of the cerebral hemispheres bilaterally, compatible with chronic microvascular ischemic disease. No evidence of large-territorial acute infarction. No parenchymal hemorrhage. No mass lesion. No extra-axial collection. No mass effect or midline shift. No hydrocephalus. Basilar cisterns are patent. Vascular: No hyperdense vessel. Atherosclerotic calcifications are present within the cavernous internal carotid and vertebral arteries. Skull: No acute fracture or focal lesion. Sinuses/Orbits: Paranasal sinuses and mastoid air cells are clear. The orbits are unremarkable. Other: None. IMPRESSION: No acute intracranial abnormality. Electronically Signed   By: Morgane  Naveau M.D.   On: 04/20/2024 18:00     Assessment/Plan   1. UTI d/t ESBL Klebsiella  - Not septic on admission but organism is resistant to nitrofurantoin and patient allergic to Bactrim   - Continue meropenem , she may be able to complete course at her SNF, will consult TOC    2. Depression, anxiety, schizoaffective disorder  - Continue Latuda , fluphenazine , lithium , Ativan , Zoloft , and Cogentin , use delirium precautions   3. Hx of CVA  - Lipitor, Plavix     4. Hypertension  - Metoprolol    5. CKD 3A  - Appears close to baseline  - Renally-dose medications    6. Left kidney lesion  - Followed by urology    DVT prophylaxis: Lovenox   Code Status: DNR  Level of Care: Level of care: Med-Surg Family Communication: Son updatead in ED  Disposition Plan:  Patient is from: SNF   Anticipated d/c is to: SNF  Anticipated d/c date is: 04/23/24  Patient currently: Pending treatment of ESBL UTI Consults called: None  Admission status: Inpatient     Evalene GORMAN Sprinkles, MD [facility]  04/20/2024, 9:06 PM

## 2024-04-20 NOTE — ED Provider Notes (Signed)
 [place] [facility] DE[facility]MENT AT [facility] Provider Note   CSN: [id number] Arrival date & time: 04/20/24  1637     Patient presents with: Urinary Frequency ( Positive Urine culture)   Heidi Blevins is a 73 y.o. female presented to ED back from her nursing facility, [facility], with concern for positive urine culture.  I did see the patient myself 4 days ago in the ER, at that time sent over for confusion, excessive somnolence, which was unusual for her behavior.  Her workup was largely unremarkable except for the possibility of UTI on her urinalysis.  I started the patient on Keflex  and discharged back to her facility.  Subsequently urine cultures have returned with multidrug resistance.  The patient is allergic to sulfa antibiotics therefore cannot have Bactrim. Klebsiella Pneumoniae 100K  Susceptibility   Klebsiella pneumoniae    MIC    AMPICILLIN >=32 RESIST... Resistant    AMPICILLIN/SULBACTAM >=32 RESIST... Resistant    CEFAZOLIN  (URINE)  Resistant 1    CEFEPIME  >=32 RESIST... Resistant    CEFTRIAXONE  >=64 RESIST... Resistant    CIPROFLOXACIN 0.5 INTERME... Intermediate    ERTAPENEM  <=0.12 SENS... Sensitive    GENTAMICIN >=16 RESIST... Resistant    MEROPENEM  <=0.25 SENS... Sensitive    NITROFURANTOIN 128 RESISTANT Resistant    PIP/TAZO  Sensitive 2    TRIMETH/SULFA <=20 SENSIT... Sensitive       I spoke to the staff at [facility] who reports that the patient continues to be more confused or not behaving like herself, particularly sleeping all day.  This November tells me normally she is really perky and almost crawling out of bed all the time.   Her son Darlynn by phone tells me that the patient has not gotten better since returning from the ED 4 days ago.  He reports that she has been excessively exhausted.  Needing to be fed by family.  This is extremely unusual for her.   The patient has dementia and is a level 5 caveat, cannot provide any  further information   HPI     Prior to Admission medications   Medication Sig Start Date End Date Taking? Authorizing Provider  acetaminophen  (TYLENOL ) 500 MG tablet Take 1,000 mg by mouth in the morning and at bedtime.    [provider]  atorvastatin  (LIPITOR) 40 MG tablet Take 1 tablet (40 mg total) by mouth daily. 02/22/24   Vernon Ranks, MD  benztropine  (COGENTIN ) 1 MG tablet Take 1 mg by mouth 2 (two) times daily.    [provider]  cephALEXin  (KEFLEX ) 500 MG capsule Take 1 capsule (500 mg total) by mouth 3 (three) times daily for 6 days. 04/17/24 04/24/24  Cottie Donnice PARAS, MD  clopidogrel  (PLAVIX ) 75 MG tablet Take 1 tablet (75 mg total) by mouth daily. 02/18/24   Vernon Ranks, MD  Cranberry 425 MG CAPS Take 425 mg by mouth 2 (two) times a day.    [provider]  estradiol (ESTRACE) 0.1 MG/GM vaginal cream Place 1 Applicatorful vaginally. TWICE PER WEEK 01/29/24   [provider]  ferrous sulfate (FEROSUL) 325 (65 FE) MG tablet Take 325 mg by mouth daily with breakfast.    [provider]  fluPHENAZine  (PROLIXIN ) 10 MG tablet Take 20 mg by mouth in the morning and at bedtime.    [provider]  ibuprofen  (ADVIL ) 600 MG tablet Take 600 mg by mouth 2 (two) times daily as needed. 06/22/23   [provider]  lidocaine  (  LIDODERM ) 5 % Place 1-3 patches onto the skin daily. NEED SITE(S) SPECIFIED, FREQUENCY CONFIRMED AND NEED PARAMETERS FOR NUMBER OF PATCHES 12 HRS ON 12 HRS OFF    [provider]  lithium  carbonate 150 MG capsule Take 1 capsule (150 mg total) by mouth at bedtime. 04/25/22 02/10/24  Christobal Guadalajara, MD  loratadine  (CLARITIN ) 10 MG tablet Take 10 mg by mouth daily.    [provider]  LORazepam  (ATIVAN ) 0.5 MG tablet Take 1 tablet (0.5 mg total) by mouth in the morning, at noon, in the evening, and at bedtime. 02/15/24   Pahwani, Ravi, MD  Lurasidone  HCl (LATUDA ) 120 MG TABS Take 120 mg by mouth daily.     [provider]  melatonin 5 MG TABS Take 5 mg by mouth at bedtime.    [provider]  Menthol , Topical Analgesic, (BENGAY VANISHING SCENT) 2.5 % GEL Apply topically 3 (three) times daily as needed (BACK PAIN). APPLY TO LOWER BACK THREE TIMES DAILY AS NEEDED FOR BACK PAIN    [provider]  metoprolol  tartrate (LOPRESSOR ) 25 MG tablet Take 25 mg by mouth 2 (two) times daily. 06/13/23   [provider]  Multiple Vitamins-Minerals (MULTIVITAMINS THER. W/MINERALS) TABS tablet Take 1 tablet by mouth daily.    [provider]  polyethylene glycol powder (GLYCOLAX /MIRALAX ) 17 GM/SCOOP powder Take 17 g by mouth daily.    [provider]  Pramoxine HCl (VAGISIL ANTI-ITCH MEDICATED) 1 % MISC Apply vaginally once a day Apply 1 application topically daily as needed (for vaginal itching). Apply vaginally once a day Patient taking differently: Apply 1 application  topically daily as needed (for vaginal itching). 03/25/19   Sebastian Toribio GAILS, MD  Propylene Glycol (SYSTANE BALANCE OP) Place 1 drop into both eyes 3 (three) times daily.    [provider]  sertraline  (ZOLOFT ) 100 MG tablet Take 150 mg by mouth daily.    [provider]  simethicone  (MYLICON) 80 MG chewable tablet Chew 1 tablet (80 mg total) by mouth 4 (four) times daily. 08/13/20   Tobie Yetta HERO, MD    Allergies: Sulfonamide derivatives    Review of Systems  Updated Vital Signs BP 115/69   Pulse 63   Temp 98.7 F (37.1 C)   Resp 20   Ht 5' (1.524 m)   Wt 50 kg   SpO2 97%   BMI 21.53 kg/m   Physical Exam Constitutional:      General: She is not in acute distress.    Comments: Appears pleasantly demented  HENT:     Head: Normocephalic and atraumatic.  Eyes:     Conjunctiva/sclera: Conjunctivae normal.     Pupils: Pupils are equal, round, and reactive to light.  Cardiovascular:     Rate and Rhythm: Normal rate and regular rhythm.  Pulmonary:     Effort:  Pulmonary effort is normal. No respiratory distress.  Abdominal:     General: There is no distension.     Tenderness: There is no abdominal tenderness.  Skin:    General: Skin is warm and dry.  Neurological:     General: No focal deficit present.     Mental Status: She is alert and oriented to person, place, and time. Mental status is at baseline.     (all labs ordered are listed, but only abnormal results are displayed) Labs Reviewed  BASIC METABOLIC PANEL WITH GFR - Abnormal; Notable for the following components:      Result Value  CO2 19 (*)    Creatinine, Ser 1.25 (*)    GFR, Estimated 46 (*)    All other components within normal limits  CBC WITH DIFFERENTIAL/PLATELET - Abnormal; Notable for the following components:   Hemoglobin 11.5 (*)    Abs Immature Granulocytes 0.22 (*)    All other components within normal limits  URINALYSIS, ROUTINE W REFLEX MICROSCOPIC - Abnormal; Notable for the following components:   Hgb urine dipstick TRACE (*)    Bilirubin Urine SMALL (*)    Ketones, ur TRACE (*)    Protein, ur TRACE (*)    Nitrite POSITIVE (*)    Leukocytes,Ua MODERATE (*)    All other components within normal limits  URINALYSIS, MICROSCOPIC (REFLEX) - Abnormal; Notable for the following components:   Bacteria, UA RARE (*)    All other components within normal limits  URINE CULTURE    EKG: None  Radiology: CT Head Wo Contrast Result Date: 04/20/2024 CLINICAL DATA:  Mental status change, unknown cause Pt arrived via EMS from [facility] Call back with positive urine culture from hospital visit last week. Current abx do not cover. Hx Dementia EXAM: CT HEAD WITHOUT CONTRAST TECHNIQUE: Contiguous axial images were obtained from the base of the skull through the vertex without intravenous contrast. RADIATION DOSE REDUCTION: This exam was performed according to the departmental dose-optimization program which includes automated exposure control, adjustment of the mA and/or kV  according to patient size and/or use of iterative reconstruction technique. COMPARISON:  CT head 09/22/2023 FINDINGS: Brain: Patchy and confluent areas of decreased attenuation are noted throughout the deep and periventricular white matter of the cerebral hemispheres bilaterally, compatible with chronic microvascular ischemic disease. No evidence of large-territorial acute infarction. No parenchymal hemorrhage. No mass lesion. No extra-axial collection. No mass effect or midline shift. No hydrocephalus. Basilar cisterns are patent. Vascular: No hyperdense vessel. Atherosclerotic calcifications are present within the cavernous internal carotid and vertebral arteries. Skull: No acute fracture or focal lesion. Sinuses/Orbits: Paranasal sinuses and mastoid air cells are clear. The orbits are unremarkable. Other: None. IMPRESSION: No acute intracranial abnormality. Electronically Signed   By: Morgane  Naveau M.D.   On: 04/20/2024 18:00     Procedures   Medications Ordered in the ED  meropenem  (MERREM ) 1 g in sodium chloride  0.9 % 100 mL IVPB (has no administration in time range)                                    Medical Decision Making Amount and/or Complexity of Data Reviewed Labs: ordered. Radiology: ordered.  Risk Decision regarding hospitalization.   This patient presents to the ED with concern for weakness, confusion, behavior change. This involves an extensive number of treatment options, and is a complaint that carries with it a high risk of complications and morbidity.  The differential diagnosis includes symptomatic UTI versus metabolic derangement versus other  Co-morbidities that complicate the patient evaluation: History of dementia, UTI  Additional history obtained from EMS, patient's son, facility provider  External records from outside source obtained and reviewed including urine culture results as noted above  I ordered and personally interpreted labs.  The pertinent results  include:  no emergent findings on blood tests; UA consistent with infection  I ordered imaging studies including CT scan of the head I independently visualized and interpreted imaging which showed no emergent findings I agree with the radiologist interpretation  I ordered medication  including IV meropenem  for ESBL UTI  I have reviewed the patients home medicines and have made adjustments as needed  Test Considered: no indication for CT imaging of abdomen at this time  After the interventions noted above, I reevaluated the patient and found that they have: stayed the same  Given family and facility concern for weakness, behavior change, I think we had best proceed with treatment for UTI with IV antibiotics.  I spoke to her son Birdia by phone who agreed.  Plan for admission.  She does not demonstrate signs of sepsis on admission.  Disposition:  After consideration of the diagnostic results and the patient's response to treatment, I feel that the patient would benefit from medical admission for IV antibiotics      Final diagnoses:  Acute cystitis with hematuria    ED Discharge Orders     None          Cottie Donnice PARAS, MD 04/20/24 [phone]

## 2024-04-20 NOTE — ED Notes (Signed)
 Unable to get labs, called phleb for assistance. Pt son at bedside

## 2024-04-20 NOTE — Progress Notes (Signed)
 ED Antimicrobial Stewardship Positive Culture Follow Up   Heidi Blevins is an 73 y.o. female who presented to [facility] on 04/16/2024 with a chief complaint of  Chief Complaint  Patient presents with   Abdominal Pain    Recent Results (from the past 720 hours)  Resp panel by RT-PCR (RSV, Flu A&B, Covid) Urine, Clean Catch     Status: None   Collection Time: 04/16/24 10:04 PM   Specimen: Urine, Clean Catch; Nasal Swab  Result Value Ref Range Status   SARS Coronavirus 2 by RT PCR NEGATIVE NEGATIVE Final    Comment: (NOTE) SARS-CoV-2 target nucleic acids are NOT DETECTED.  The SARS-CoV-2 RNA is generally detectable in upper respiratory specimens during the acute phase of infection. The lowest concentration of SARS-CoV-2 viral copies this assay can detect is 138 copies/mL. A negative result does not preclude SARS-Cov-2 infection and should not be used as the sole basis for treatment or other patient management decisions. A negative result may occur with  improper specimen collection/handling, submission of specimen other than nasopharyngeal swab, presence of viral mutation(s) within the areas targeted by this assay, and inadequate number of viral copies(<138 copies/mL). A negative result must be combined with clinical observations, patient history, and epidemiological information. The expected result is Negative.  Fact Sheet for Patients:  BloggerCourse.com  Fact Sheet for Healthcare Providers:  SeriousBroker.it  This test is no t yet approved or cleared by the [place]  FDA and  has been authorized for detection and/or diagnosis of SARS-CoV-2 by FDA under an Emergency Use Authorization (EUA). This EUA will remain  in effect (meaning this test can be used) for the duration of the COVID-19 declaration under Section 564(b)(1) of the Act, 21 U.S.C.section 360bbb-3(b)(1), unless the authorization is terminated  or revoked  sooner.       Influenza A by PCR NEGATIVE NEGATIVE Final   Influenza B by PCR NEGATIVE NEGATIVE Final    Comment: (NOTE) The Xpert Xpress SARS-CoV-2/FLU/RSV plus assay is intended as an aid in the diagnosis of influenza from Nasopharyngeal swab specimens and should not be used as a sole basis for treatment. Nasal washings and aspirates are unacceptable for Xpert Xpress SARS-CoV-2/FLU/RSV testing.  Fact Sheet for Patients: BloggerCourse.com  Fact Sheet for Healthcare Providers: SeriousBroker.it  This test is not yet approved or cleared by the [place]  FDA and has been authorized for detection and/or diagnosis of SARS-CoV-2 by FDA under an Emergency Use Authorization (EUA). This EUA will remain in effect (meaning this test can be used) for the duration of the COVID-19 declaration under Section 564(b)(1) of the Act, 21 U.S.C. section 360bbb-3(b)(1), unless the authorization is terminated or revoked.     Resp Syncytial Virus by PCR NEGATIVE NEGATIVE Final    Comment: (NOTE) Fact Sheet for Patients: BloggerCourse.com  Fact Sheet for Healthcare Providers: SeriousBroker.it  This test is not yet approved or cleared by the [place]  FDA and has been authorized for detection and/or diagnosis of SARS-CoV-2 by FDA under an Emergency Use Authorization (EUA). This EUA will remain in effect (meaning this test can be used) for the duration of the COVID-19 declaration under Section 564(b)(1) of the Act, 21 U.S.C. section 360bbb-3(b)(1), unless the authorization is terminated or revoked.  Performed at [facility], [address], [place], [place] [address]   Urine Culture     Status: Abnormal   Collection Time: 04/16/24 10:04 PM   Specimen: Urine, Clean Catch  Result Value Ref Range Status  Specimen Description   Final    URINE, CLEAN CATCH Performed at  [facility], [address], [place], [place] [address]    Special Requests   Final    NONE Performed at [facility], [address] [place], [place] [address]    Culture (A)  Final    >=100,000 COLONIES/mL KLEBSIELLA PNEUMONIAE Confirmed Extended Spectrum Beta-Lactamase Producer (ESBL).  In bloodstream infections from ESBL organisms, carbapenems are preferred over piperacillin/tazobactam. They are shown to have a lower risk of mortality. 60,000 COLONIES/mL AEROCOCCUS SPECIES Standardized susceptibility testing for this organism is not available. Performed at [facility], [address] [place], [place] [address]    Report Status 04/19/2024 FINAL  Final   Organism ID, Bacteria KLEBSIELLA PNEUMONIAE (A)  Final      Susceptibility   Klebsiella pneumoniae - MIC*    AMPICILLIN >=32 RESISTANT Resistant     CEFAZOLIN  (URINE) Value in next row Resistant      >=32 RESISTANTThis is a modified FDA-approved test that has been validated and its performance characteristics determined by the reporting laboratory.  This laboratory is certified under the Clinical Laboratory Improvement Amendments CLIA as qualified to perform high complexity clinical laboratory testing.    CEFEPIME  Value in next row Resistant      >=32 RESISTANTThis is a modified FDA-approved test that has been validated and its performance characteristics determined by the reporting laboratory.  This laboratory is certified under the Clinical Laboratory Improvement Amendments CLIA as qualified to perform high complexity clinical laboratory testing.    ERTAPENEM  Value in next row Sensitive      >=32 RESISTANTThis is a modified FDA-approved test that has been validated and its performance characteristics determined by the reporting laboratory.  This laboratory is certified under the Clinical Laboratory Improvement Amendments CLIA as qualified to perform high complexity clinical laboratory  testing.    CEFTRIAXONE  Value in next row Resistant      >=32 RESISTANTThis is a modified FDA-approved test that has been validated and its performance characteristics determined by the reporting laboratory.  This laboratory is certified under the Clinical Laboratory Improvement Amendments CLIA as qualified to perform high complexity clinical laboratory testing.    CIPROFLOXACIN Value in next row Intermediate      >=32 RESISTANTThis is a modified FDA-approved test that has been validated and its performance characteristics determined by the reporting laboratory.  This laboratory is certified under the Clinical Laboratory Improvement Amendments CLIA as qualified to perform high complexity clinical laboratory testing.    GENTAMICIN Value in next row Resistant      >=32 RESISTANTThis is a modified FDA-approved test that has been validated and its performance characteristics determined by the reporting laboratory.  This laboratory is certified under the Clinical Laboratory Improvement Amendments CLIA as qualified to perform high complexity clinical laboratory testing.    NITROFURANTOIN Value in next row Resistant      >=32 RESISTANTThis is a modified FDA-approved test that has been validated and its performance characteristics determined by the reporting laboratory.  This laboratory is certified under the Clinical Laboratory Improvement Amendments CLIA as qualified to perform high complexity clinical laboratory testing.    TRIMETH/SULFA Value in next row Sensitive      >=32 RESISTANTThis is a modified FDA-approved test that has been validated and its performance characteristics determined by the reporting laboratory.  This laboratory is certified under the Clinical Laboratory Improvement Amendments CLIA as qualified to perform high complexity clinical laboratory testing.  AMPICILLIN/SULBACTAM Value in next row Resistant      >=32 RESISTANTThis is a modified FDA-approved test that has been validated and its  performance characteristics determined by the reporting laboratory.  This laboratory is certified under the Clinical Laboratory Improvement Amendments CLIA as qualified to perform high complexity clinical laboratory testing.    PIP/TAZO Value in next row Sensitive ug/mL     <=4 SENSITIVEThis is a modified FDA-approved test that has been validated and its performance characteristics determined by the reporting laboratory.  This laboratory is certified under the Clinical Laboratory Improvement Amendments CLIA as qualified to perform high complexity clinical laboratory testing.    MEROPENEM  Value in next row Sensitive      <=4 SENSITIVEThis is a modified FDA-approved test that has been validated and its performance characteristics determined by the reporting laboratory.  This laboratory is certified under the Clinical Laboratory Improvement Amendments CLIA as qualified to perform high complexity clinical laboratory testing.    * >=100,000 COLONIES/mL KLEBSIELLA PNEUMONIAE    Patient is a 73yoF presenting with lower abdominal tenderness in the suprapubic area and generalized weakness. UA with >50 WBC and many bacteria. Pertinent PMH of recurrent UTIs (40k klebsiella pneumoniae in 01/2024, during which ID was consulted and patient subsequently received a 10 day course of IV Ertapenem  after PICC placement).  [x]  Treated with Cephalexin , organism resistant to prescribed antimicrobial  Patient to return to hospital to receive IV antibiotics. ID consult upon admission is suggested.    ED Provider: Saddie Showers, PA   Huber Mathers 04/20/2024, 10:44 AM Clinical Pharmacist Monday - Friday phone -  [phone] Saturday - Sunday phone - [phone]

## 2024-04-20 NOTE — ED Triage Notes (Signed)
 Pt arrived via EMS from [facility] Call back with positive urine culture from hospital visit last week. Current abx do not cover. Hx Dementia

## 2024-04-20 NOTE — Telephone Encounter (Signed)
 Post ED Visit - Positive Culture Follow-up: Successful Patient Follow-Up  Culture assessed and recommendations reviewed by:  []  Rankin Dee, Pharm.D. []  Venetia Gully, Pharm.D., BCPS AQ-ID []  Garrel Crews, Pharm.D., BCPS []  Almarie Lunger, Pharm.D., BCPS []  [place], [address].D., BCPS, AAHIVP []  Rosaline Bihari, Pharm.D., BCPS, AAHIVP []  Vernell Meier, PharmD, BCPS []  Latanya Hint, PharmD, BCPS []  Donald Medley, PharmD, BCPS []  Rocky Bold, PharmD  Positive urine culture  []  Patient discharged without antimicrobial prescription and treatment is now indicated [x]  Organism is resistant to prescribed ED discharge antimicrobial []  Patient with positive blood cultures  Changes discussed with ED provider: [name] Return to ED for IV antibiotics. Spoken to [facility] Med Forensic scientist and  [employer] Resident Care Coodinator and fax culture results to [phone]. Plans to transport patient back to hosptial.   Contacted patient, date 04/21/07, time 1600   [name] 04/20/2024, 3:58 PM

## 2024-04-20 NOTE — ED Notes (Signed)
 Pt tolerate in and out cath well urine sent

## 2024-04-21 DIAGNOSIS — N1831 Chronic kidney disease, stage 3a: Secondary | ICD-10-CM | POA: Diagnosis not present

## 2024-04-21 DIAGNOSIS — Z8673 Personal history of transient ischemic attack (TIA), and cerebral infarction without residual deficits: Secondary | ICD-10-CM | POA: Diagnosis not present

## 2024-04-21 DIAGNOSIS — I10 Essential (primary) hypertension: Secondary | ICD-10-CM | POA: Diagnosis not present

## 2024-04-21 DIAGNOSIS — N390 Urinary tract infection, site not specified: Secondary | ICD-10-CM | POA: Diagnosis not present

## 2024-04-21 LAB — CBC
HCT: 35.6 % — ABNORMAL LOW (ref 36.0–46.0)
Hemoglobin: 11.4 g/dL — ABNORMAL LOW (ref 12.0–15.0)
MCH: 29.1 pg (ref 26.0–34.0)
MCHC: 32 g/dL (ref 30.0–36.0)
MCV: 90.8 fL (ref 80.0–100.0)
Platelets: 354 K/uL (ref 150–400)
RBC: 3.92 MIL/uL (ref 3.87–5.11)
RDW: 13.6 % (ref 11.5–15.5)
WBC: 9.1 K/uL (ref 4.0–10.5)
nRBC: 0 % (ref 0.0–0.2)

## 2024-04-21 LAB — BASIC METABOLIC PANEL WITH GFR
Anion gap: 3 — ABNORMAL LOW (ref 5–15)
BUN: 16 mg/dL (ref 8–23)
CO2: 22 mmol/L (ref 22–32)
Calcium: 10.1 mg/dL (ref 8.9–10.3)
Chloride: 113 mmol/L — ABNORMAL HIGH (ref 98–111)
Creatinine, Ser: 1.04 mg/dL — ABNORMAL HIGH (ref 0.44–1.00)
GFR, Estimated: 57 mL/min — ABNORMAL LOW (ref >60)
Glucose, Bld: 93 mg/dL (ref 70–99)
Potassium: 3.7 mmol/L (ref 3.5–5.1)
Sodium: 138 mmol/L (ref 135–145)

## 2024-04-21 MED ORDER — LORAZEPAM 0.5 MG PO TABS
0.5000 mg | ORAL_TABLET | Freq: Four times a day (QID) | ORAL | Status: DC
  Administered 2024-04-21 – 2024-04-23 (×9): 0.5 mg via ORAL
  Filled 2024-04-21 (×9): qty 1

## 2024-04-21 MED ORDER — MUSCLE RUB 10-15 % EX CREA: TOPICAL_CREAM | Freq: Three times a day (TID) | CUTANEOUS | Status: DC | PRN

## 2024-04-21 MED ORDER — ENOXAPARIN SODIUM 30 MG/0.3ML IJ SOSY
30.0000 mg | PREFILLED_SYRINGE | INTRAMUSCULAR | Status: DC
  Administered 2024-04-21 – 2024-04-23 (×3): 30 mg via SUBCUTANEOUS
  Filled 2024-04-21 (×3): qty 0.3

## 2024-04-21 NOTE — TOC Initial Note (Signed)
 Transition of Care [facility]) - Initial/Assessment Note    Patient Details  Name: Heidi Blevins MRN: [id number] Date of Birth: May 05, 1951  Transition of Care [facility]) CM/SW Contact:    Lendia Dais, LCSWA Phone Number: 04/21/2024, 3:08 PM  Clinical Narrative:  Patient is from [facility].  CSW spoke to [place] in admissions at [facility] and stated that the pt is ambulatory at baseline. Fax number for [facility] is [phone]. Carlos stated that they would need an FL2 upon discharge and for medications to be sent to their Pharmacy [facility] , [address], [place], [place].  CSW will continue to follow.                 Patient Goals and CMS Choice            Expected Discharge Plan and Services                                              Prior Living Arrangements/Services                       Activities of Daily Living   ADL Screening (condition at time of admission) Independently performs ADLs?: No Does the patient have a NEW difficulty with bathing/dressing/toileting/self-feeding that is expected to last >3 days?: Yes (Initiates electronic notice to provider for possible OT consult) Does the patient have a NEW difficulty with getting in/out of bed, walking, or climbing stairs that is expected to last >3 days?: Yes (Initiates electronic notice to provider for possible PT consult) Does the patient have a NEW difficulty with communication that is expected to last >3 days?: No Is the patient deaf or have difficulty hearing?: Yes Does the patient have difficulty seeing, even when wearing glasses/contacts?: No Does the patient have difficulty concentrating, remembering, or making decisions?: Yes  Permission Sought/Granted                  Emotional Assessment              Admission diagnosis:  Acute cystitis with hematuria [N30.01] UTI due to extended-spectrum beta lactamase (ESBL) producing Escherichia coli [N39.0, B96.29,  Z16.12] Patient Active Problem List   Diagnosis Date Noted   Urinary tract infection due to ESBL Klebsiella 04/20/2024   Bladder obstruction 02/10/2024   Hydroureteronephrosis 02/10/2024   Anemia 02/10/2024   Bladder hemorrhage 02/10/2024   ABLA (acute blood loss anemia) 02/10/2024   CKD stage 3a, GFR 45-59 ml/min (HCC) 09/23/2023   Pericardial effusion 09/23/2023   Kidney lesion, native, left 09/23/2023   Acute cystitis 04/23/2022   Chronic diastolic CHF (congestive heart failure) (HCC) 04/23/2022   GAD (generalized anxiety disorder) 04/23/2022   HLD (hyperlipidemia) 04/23/2022   Polypharmacy 08/09/2020   Acute CVA (cerebrovascular accident) (HCC) 08/09/2020   Thyroid nodule 08/09/2020   History of CVA (cerebrovascular accident) 08/08/2020   Bradycardia    Acute metabolic encephalopathy    Sepsis secondary to UTI (HCC) 03/20/2019   Closed displaced fracture of left femoral neck (HCC) 09/26/2018   AKI (acute kidney injury) (HCC) 06/13/2016   UTI (urinary tract infection) 06/13/2016   Essential hypertension 06/13/2016   Urinary tract infection without hematuria    Complicated UTI (urinary tract infection) 05/03/2016   Hypercalcemia 05/03/2016   Dehydration 05/03/2016   Acute encephalopathy 05/03/2016   Cerebrovascular disease 05/03/2016  Schizoaffective disorder (HCC) 05/03/2016   Bifascicular block 05/03/2016   PCP:  Pcp, No Pharmacy:   [facility] [address] [place] [address] Phone: [phone] Fax: [phone]     Social Drivers of Health (SDOH) Social History: SDOH Screenings   Food Insecurity: Patient Unable To Answer (04/21/2024)  Housing: Patient Unable To Answer (04/21/2024)  Transportation Needs: Patient Unable To Answer (04/21/2024)  Utilities: Patient Unable To Answer (04/21/2024)  Social Connections: Patient Unable To Answer (04/21/2024)  Tobacco Use: High Risk (04/20/2024)   SDOH Interventions:      Readmission Risk Interventions    09/24/2023   12:58 PM  Readmission Risk Prevention Plan  Post Dischage Appt Complete  Medication Screening Complete  Transportation Screening Complete

## 2024-04-21 NOTE — Progress Notes (Signed)
 Progress Note   Patient: Heidi Blevins FMW:[id number] DOB: 06-18-1951 DOA: 04/20/2024     1 DOS: the patient was seen and examined on 04/21/2024   Brief hospital course: Heidi Blevins is a 73 y.o. female with medical history significant for hypertension, history of CVA, depression, anxiety, schizoaffective disorder, and CKD 3A who was seen in the ED on 04/16/2024 for lethargy, was diagnosed with UTI and discharged back to her SNF with Keflex  at that time, but now returns due to urine culture growing ESBL Klebsiella. Started on Meropenem  and admitted to TRH service.  Assessment and Plan: ESBL Klebsiella UTI- Urine culture sensitivities reviewed. Continue meropenem . She does not have fever, white count normal. TOC consult for return to SNF with IV antibiotics if possible  Depression anxiety, schizoaffective disorder- Continue Latuda , fluphenazine , lithium , Ativan , Zoloft  and Cogentin  Delirium precautions.  History of CVA= Continue statin and Plavix .  Hypertension- BP stable.  Continue metoprolol .  CKD stage IIIa- Stable creatinine. Monitor daily renal function. Avoid nephrotoxins.  Left kidney lesion followed by urology. Patient's prior admission in June reviewed. Cultures positive for ESBL, she had undergone bladder irrigation. Outpatient urology follow-up suggested.  BMI 19.46- dietician consulted.     Out of bed to chair. Incentive spirometry. Nursing supportive care. Fall, aspiration precautions. Diet:  Diet Orders (From admission, onward)     Start     Ordered   04/20/24 2009  Diet regular Fluid consistency: Thin  Diet effective now       Question:  Fluid consistency:  Answer:  Thin   04/20/24 2009           DVT prophylaxis: enoxaparin  (LOVENOX ) injection 30 mg Start: 04/21/24 1200  Level of care: Med-Surg   Code Status: Limited: Do not attempt resuscitation (DNR) -DNR-LIMITED -Do Not Intubate/DNI   Subjective: Patient is seen and examined  today morning.  She is calm, lying in the bed.  Son at bedside.  She denies any complaints.  States she ate breakfast.  Physical Exam: Vitals:   04/20/24 2126 04/21/24 0120 04/21/24 0540 04/21/24 0904  BP: 117/67 (!) 105/59 108/62 119/69  Pulse: 61 75 78 76  Resp: 20 20 18 18   Temp: 98 F (36.7 C) 99.2 F (37.3 C) 98.8 F (37.1 C) 98.4 F (36.9 C)  TempSrc: Oral Oral Oral   SpO2: 100% 97% 99% 100%  Weight: 45.2 kg     Height:        General - Elderly African-American female, no apparent distress HEENT - PERRLA, EOMI, atraumatic head, non tender sinuses. Lung - Clear, no rales, rhonchi, wheezes. Heart - S1, S2 heard, no murmurs, rubs, no pedal edema. Abdomen - Soft, non tender, bowel sounds good Neuro - Alert, awake and oriented, non focal exam. Skin - Warm and dry. Contracted extremities, scoliosis noted.  Data Reviewed:      Latest Ref Rng & Units 04/21/2024    5:36 AM 04/20/2024    4:56 PM 04/16/2024   10:52 PM  CBC  WBC 4.0 - 10.5 K/uL 9.1  9.1  11.9   Hemoglobin 12.0 - 15.0 g/dL 88.5  88.4  88.0   Hematocrit 36.0 - 46.0 % 35.6  36.9  39.6   Platelets 150 - 400 K/uL 354  317  318       Latest Ref Rng & Units 04/21/2024    5:36 AM 04/20/2024    4:56 PM 04/16/2024   10:52 PM  BMP  Glucose 70 - 99 mg/dL 93  83  91   BUN 8 - 23 mg/dL 16  17  28    Creatinine 0.44 - 1.00 mg/dL 8.95  8.74  8.78   Sodium 135 - 145 mmol/L 138  137  137   Potassium 3.5 - 5.1 mmol/L 3.7  4.1  3.8   Chloride 98 - 111 mmol/L 113  107  109   CO2 22 - 32 mmol/L 22  19  18    Calcium  8.9 - 10.3 mg/dL 89.8  89.7  88.9    CT Head Wo Contrast Result Date: 04/20/2024 CLINICAL DATA:  Mental status change, unknown cause Pt arrived via EMS from Facility CC Call back with positive urine culture from hospital visit last week. Current abx do not cover. Hx Dementia EXAM: CT HEAD WITHOUT CONTRAST TECHNIQUE: Contiguous axial images were obtained from the base of the skull through the vertex without  intravenous contrast. RADIATION DOSE REDUCTION: This exam was performed according to the departmental dose-optimization program which includes automated exposure control, adjustment of the mA and/or kV according to patient size and/or use of iterative reconstruction technique. COMPARISON:  CT head 09/22/2023 FINDINGS: Brain: Patchy and confluent areas of decreased attenuation are noted throughout the deep and periventricular white matter of the cerebral hemispheres bilaterally, compatible with chronic microvascular ischemic disease. No evidence of large-territorial acute infarction. No parenchymal hemorrhage. No mass lesion. No extra-axial collection. No mass effect or midline shift. No hydrocephalus. Basilar cisterns are patent. Vascular: No hyperdense vessel. Atherosclerotic calcifications are present within the cavernous internal carotid and vertebral arteries. Skull: No acute fracture or focal lesion. Sinuses/Orbits: Paranasal sinuses and mastoid air cells are clear. The orbits are unremarkable. Other: None. IMPRESSION: No acute intracranial abnormality. Electronically Signed   By: Morgane  Naveau M.D.   On: 04/20/2024 18:00    Family Communication: Discussed with patient's son at bedside, understand and agree. All questions answered.  Disposition: Status is: Inpatient Remains inpatient appropriate because: IV meropenem   Planned Discharge Destination: Skilled nursing facility     Time spent: 44 minutes  Author: Concepcion Riser, MD 04/21/2024 5:39 PM Secure chat 7am to 7pm For on call review [email or web address].

## 2024-04-22 DIAGNOSIS — R5383 Other fatigue: Secondary | ICD-10-CM

## 2024-04-22 DIAGNOSIS — R519 Headache, unspecified: Secondary | ICD-10-CM

## 2024-04-22 DIAGNOSIS — E43 Unspecified severe protein-calorie malnutrition: Secondary | ICD-10-CM | POA: Insufficient documentation

## 2024-04-22 DIAGNOSIS — N390 Urinary tract infection, site not specified: Secondary | ICD-10-CM | POA: Diagnosis not present

## 2024-04-22 DIAGNOSIS — N3090 Cystitis, unspecified without hematuria: Secondary | ICD-10-CM

## 2024-04-22 DIAGNOSIS — N1831 Chronic kidney disease, stage 3a: Secondary | ICD-10-CM | POA: Diagnosis not present

## 2024-04-22 DIAGNOSIS — I10 Essential (primary) hypertension: Secondary | ICD-10-CM | POA: Diagnosis not present

## 2024-04-22 DIAGNOSIS — Z8673 Personal history of transient ischemic attack (TIA), and cerebral infarction without residual deficits: Secondary | ICD-10-CM | POA: Diagnosis not present

## 2024-04-22 LAB — BASIC METABOLIC PANEL WITH GFR
Anion gap: 7 (ref 5–15)
BUN: 20 mg/dL (ref 8–23)
CO2: 21 mmol/L — ABNORMAL LOW (ref 22–32)
Calcium: 10 mg/dL (ref 8.9–10.3)
Chloride: 108 mmol/L (ref 98–111)
Creatinine, Ser: 1.19 mg/dL — ABNORMAL HIGH (ref 0.44–1.00)
GFR, Estimated: 48 mL/min — ABNORMAL LOW (ref >60)
Glucose, Bld: 79 mg/dL (ref 70–99)
Potassium: 4 mmol/L (ref 3.5–5.1)
Sodium: 136 mmol/L (ref 135–145)

## 2024-04-22 LAB — CBC
HCT: 32.6 % — ABNORMAL LOW (ref 36.0–46.0)
Hemoglobin: 10.5 g/dL — ABNORMAL LOW (ref 12.0–15.0)
MCH: 29.2 pg (ref 26.0–34.0)
MCHC: 32.2 g/dL (ref 30.0–36.0)
MCV: 90.8 fL (ref 80.0–100.0)
Platelets: 313 K/uL (ref 150–400)
RBC: 3.59 MIL/uL — ABNORMAL LOW (ref 3.87–5.11)
RDW: 13.4 % (ref 11.5–15.5)
WBC: 9.7 K/uL (ref 4.0–10.5)
nRBC: 0 % (ref 0.0–0.2)

## 2024-04-22 MED ORDER — ADULT MULTIVITAMIN W/MINERALS CH
1.0000 | ORAL_TABLET | Freq: Every day | ORAL | Status: DC
  Administered 2024-04-22 – 2024-04-23 (×2): 1 via ORAL
  Filled 2024-04-22 (×2): qty 1

## 2024-04-22 MED ORDER — ENSURE PLUS HIGH PROTEIN PO LIQD
237.0000 mL | Freq: Two times a day (BID) | ORAL | Status: DC
  Administered 2024-04-22 – 2024-04-23 (×3): 237 mL via ORAL

## 2024-04-22 MED ORDER — THIAMINE MONONITRATE 100 MG PO TABS
100.0000 mg | ORAL_TABLET | Freq: Every day | ORAL | Status: DC
  Administered 2024-04-22 – 2024-04-23 (×2): 100 mg via ORAL
  Filled 2024-04-22 (×2): qty 1

## 2024-04-22 NOTE — Progress Notes (Signed)
 Progress Note   Patient: Heidi Blevins FMW:[id number] DOB: 1950-09-18 DOA: 04/20/2024     2 DOS: the patient was seen and examined on 04/22/2024   Brief hospital course: NEISHA HINGER is a 73 y.o. female with medical history significant for hypertension, history of CVA, depression, anxiety, schizoaffective disorder, and CKD 3A who was seen in the ED on 04/16/2024 for lethargy, was diagnosed with UTI and discharged back to her SNF with Keflex  at that time, but now returns due to urine culture growing ESBL Klebsiella. Started on Meropenem  and admitted to [facility] service.  Assessment and Plan: ESBL Klebsiella UTI- Urine culture sensitivities reviewed. Continue meropenem . She does not have fever, white count normal. ID advised 3 days of meropenem  and stop. TOC consulted. She is from assisted living facility.  Depression anxiety, schizoaffective disorder- Continue Latuda , fluphenazine , lithium , Ativan , Zoloft  and Cogentin  Delirium precautions.  History of CVA= Continue statin and Plavix .  Hypertension- BP stable.  Continue metoprolol .  CKD stage IIIa- Stable creatinine. Monitor daily renal function. Avoid nephrotoxins.  Left kidney lesion followed by urology. Patient's prior admission in June reviewed. Cultures positive for ESBL, she had undergone bladder irrigation. Outpatient urology follow-up suggested.  BMI 19.46- dietician consulted.     Out of bed to chair. Incentive spirometry. Nursing supportive care. Fall, aspiration precautions. Diet:  Diet Orders (From admission, onward)     Start     Ordered   04/22/24 1213  DIET DYS 3 Room service appropriate? Yes with Assist; Fluid consistency: Thin  Diet effective now       Question Answer Comment  Room service appropriate? Yes with Assist   Fluid consistency: Thin      04/22/24 1213           DVT prophylaxis: enoxaparin  (LOVENOX ) injection 30 mg Start: 04/21/24 1200  Level of care: Med-Surg   Code Status:  Limited: Do not attempt resuscitation (DNR) -DNR-LIMITED -Do Not Intubate/DNI   Subjective: Patient is seen and examined today morning.  She is calm, lying in the bed. Eating poor. No family at bedside.  Physical Exam: Vitals:   04/22/24 0600 04/22/24 0813 04/22/24 1130 04/22/24 1644  BP: 120/67 138/66 124/70 112/69  Pulse: (!) 51 (!) 48 (!) 49 68  Resp: 15 18 16 18   Temp: 98.3 F (36.8 C) 98.7 F (37.1 C)  98.5 F (36.9 C)  TempSrc: Oral Oral  Oral  SpO2: 100% 96%  95%  Weight:      Height:        General - Elderly African-American female, no apparent distress HEENT - PERRLA, EOMI, atraumatic head, non tender sinuses. Lung - Clear, no rales, rhonchi, wheezes. Heart - S1, S2 heard, no murmurs, rubs, no pedal edema. Abdomen - Soft, non tender, bowel sounds good Neuro - Alert, awake and oriented, non focal exam. Skin - Warm and dry. Contracted extremities, scoliosis noted.  Data Reviewed:      Latest Ref Rng & Units 04/22/2024    5:20 AM 04/21/2024    5:36 AM 04/20/2024    4:56 PM  CBC  WBC 4.0 - 10.5 K/uL 9.7  9.1  9.1   Hemoglobin 12.0 - 15.0 g/dL 89.4  88.5  88.4   Hematocrit 36.0 - 46.0 % 32.6  35.6  36.9   Platelets 150 - 400 K/uL 313  354  317       Latest Ref Rng & Units 04/22/2024    5:20 AM 04/21/2024    5:36 AM 04/20/2024  4:56 PM  BMP  Glucose 70 - 99 mg/dL 79  93  83   BUN 8 - 23 mg/dL 20  16  17    Creatinine 0.44 - 1.00 mg/dL 8.80  8.95  8.74   Sodium 135 - 145 mmol/L 136  138  137   Potassium 3.5 - 5.1 mmol/L 4.0  3.7  4.1   Chloride 98 - 111 mmol/L 108  113  107   CO2 22 - 32 mmol/L 21  22  19    Calcium  8.9 - 10.3 mg/dL 89.9  89.8  89.7    No results found.   Disposition: Status is: Inpatient Remains inpatient appropriate because: IV meropenem   Planned Discharge Destination: Rehab     Time spent: 40 minutes  Author: Concepcion Riser, MD 04/22/2024 6:40 PM Secure chat 7am to 7pm For on call review [email or web address].

## 2024-04-22 NOTE — Plan of Care (Signed)

## 2024-04-22 NOTE — Progress Notes (Signed)
 Contacted on call Dr. Marien Piety. BP 97/66 (77), HR 84. Pt has 25mg  metoprolol  scheduled. BP is lowest since admission. Dr. Piety requested metoprolol  be held this evening. Informed pt, verbalized understanding.

## 2024-04-22 NOTE — Progress Notes (Signed)
 Nutrition Follow-up  DOCUMENTATION CODES:   Severe malnutrition in context of chronic illness  INTERVENTION:  Encourage PO intake Nursing to assist with meals Room service with assist  Downgrade pt's diet to dysphagia 3 as pt does not have her dentures here Ensure Plus High Protein po BID, each supplement provides 350 kcal and 20 grams of protein Magic cup TID with meals, each supplement provides 290 kcal and 9 grams of protein  MVI with minerals daily 100 mg Thiamine  x 5 days   NUTRITION DIAGNOSIS:   Severe Malnutrition related to chronic illness as evidenced by severe muscle depletion, severe fat depletion, percent weight loss (In 1 year pt has lost 48 lbs, 32% of her body weight.).  GOAL:   Patient will meet greater than or equal to 90% of their needs  MONITOR:   PO intake, Supplement acceptance, Labs, Weight trends, I & O's  REASON FOR ASSESSMENT:   Consult Assessment of nutrition requirement/status  ASSESSMENT:  73 y.o. female with PMH of HTN, history of CVA, depression, anxiety, schizoaffective disorder, and CKD 3A who was seen in the ED on 04/16/2024 for lethargy, was diagnosed with UTI and discharged back to her SNF. Now returns due to urine culture growing ESBL Klebsiella.   Pt resting in bed, pt is soft spoken, able to provide a limited history. Frail appearing.   Pt recently seen in ED for UTI on 04/16/2024. She was discharged back to SNF and it is noted that pt has remained lethargic at SNF with no improvement in her condition since leaving on antibiotics. Pt reports after discharge she was eating fairly well. Pt does not typically eat breakfast but does eat lunch and dinner.  She was unable to provide specifics on what she was eating. Pt does not know if she taking any supplements at SNF. Per TOC she is ambulatory at baseline.   Pt does endorse losing weight which is reflected in her weight history, however she does not know how much or why she has been losing  weight. In 1 year pt has lost 48 lbs, 32% of her body weight. If weight is accurate this is extremely concerning. On exam pt with severe muscle and fat wasting. Pt is severely malnourished. Encouraged pt to drink Ensure and increase PO intake.   Of note RD observed pt taking meds given by RN. Pt was struggling to bring the cup up to her mouth. Nursing to assist with feeding pt.  Pt does not have her dentures here with her and she has no teeth, downgrade diet to dysphagia 3 to help with chewing. Pt did not touch her breakfast this morning. However she did have 75% of meatloaf and mac n cheese for lunch yesterday, and 100% of chicken, rice, green beans, and fruit cup for dinner yesterday.   Endorses no nausea, vomiting, abdominal pain. Monitor PO intake as pt is at high nutritional risk and may benefit from nutrition support.   Disposition: return to SNF with IV antibiotics if possible   Admit weight: 45.2 kg Current weight: 45.2 kg    Average Meal Intake: 8/25-8/26: 50% intake x 4 recorded meals  Nutritionally Relevant Medications: Scheduled Meds:  feeding supplement  237 mL Oral BID BM   multivitamin with minerals  1 tablet Oral Daily   thiamine   100 mg Oral Daily   Continuous Infusions:  meropenem  (MERREM ) IV 1 g (04/22/24 1244)   Labs Reviewed: Creatinine 1.19  GFR 48 CBG ranges from 79-93 mg/dL over the last  24 hours HgbA1c 5.3   NUTRITION - FOCUSED PHYSICAL EXAM:  Flowsheet Row Most Recent Value  Orbital Region Severe depletion  Upper Arm Region Severe depletion  Thoracic and Lumbar Region Severe depletion  Buccal Region Severe depletion  Temple Region Severe depletion  Clavicle Bone Region Severe depletion  Clavicle and Acromion Bone Region Severe depletion  Scapular Bone Region Severe depletion  Dorsal Hand Severe depletion  Patellar Region Severe depletion  Anterior Thigh Region Severe depletion  Posterior Calf Region Severe depletion  Edema (RD Assessment) None   Hair Reviewed  Eyes Reviewed  Mouth Reviewed  [No teeth]  Skin Reviewed  Nails Reviewed    Diet Order:   Diet Order             DIET DYS 3 Room service appropriate? Yes with Assist; Fluid consistency: Thin  Diet effective now                   EDUCATION NEEDS:   Not appropriate for education at this time  Skin:  Skin Assessment: Reviewed RN Assessment  Last BM:  PTA  Height:   Ht Readings from Last 1 Encounters:  04/20/24 5' (1.524 m)    Weight:   Wt Readings from Last 1 Encounters:  04/20/24 45.2 kg    BMI:  Body mass index is 19.46 kg/m.  Estimated Nutritional Needs:   Kcal:  1400-1600 kcal  Protein:  60-80 gm  Fluid:  >1.4L/day   Olivia Kenning, RD Registered Dietitian  See Amion for more information

## 2024-04-22 NOTE — Evaluation (Signed)
 Occupational Therapy Evaluation Patient Details Name: Heidi Blevins MRN: [id number] DOB: 1951-02-05 Today's Date: 04/22/2024   History of Present Illness   73 y.o. female with who was seen in the ED on 04/16/2024 for lethargy, was diagnosed with UTI and discharged back to her SNF. Returns with urine culture growing ESBL. Medical history significant for hypertension, history of CVA, depression, anxiety, schizoaffective disorder, and CKD 3A     Clinical Impressions [name] was evaluated s/p the above admission list. She needs assist from the staff at her ALF for mobility and ADLs at baseline, pt states she does not walk much but transfers with AD. Upon evaluation the pt was limited by global weakness, unsteady balance and limited activity tolerance. Overall she needed min A for bed mobility, mod A to stand and min A for pivotal stepping with HHA. Due to the deficits listed below the pt also needs up to max A for LB ADLs and set up A for UB ADLs. Pt will benefit from continued acute OT services and discharge back to her ALF with continued staff support.       If plan is discharge home, recommend the following:   A lot of help with walking and/or transfers;A lot of help with bathing/dressing/bathroom;Assist for transportation;Help with stairs or ramp for entrance     Functional Status Assessment   Patient has had a recent decline in their functional status and demonstrates the ability to make significant improvements in function in a reasonable and predictable amount of time.     Equipment Recommendations   None recommended by OT      Precautions/Restrictions   Precautions Precautions: Fall Restrictions Weight Bearing Restrictions Per Provider Order: No     Mobility Bed Mobility Overal bed mobility: Needs Assistance Bed Mobility: Sit to Supine, Supine to Sit     Supine to sit: Min assist, HOB elevated Sit to supine: Min assist        Transfers Overall  transfer level: Needs assistance Equipment used: 1 person hand held assist Transfers: Sit to/from Stand, Bed to chair/wheelchair/BSC Sit to Stand: Mod assist     Step pivot transfers: Min assist            Balance Overall balance assessment: Needs assistance Sitting-balance support: Feet supported Sitting balance-Leahy Scale: Good     Standing balance support: Bilateral upper extremity supported, During functional activity Standing balance-Leahy Scale: Poor                             ADL either performed or assessed with clinical judgement   ADL Overall ADL's : Needs assistance/impaired Eating/Feeding: Set up;Sitting   Grooming: Set up;Sitting   Upper Body Bathing: Set up;Sitting   Lower Body Bathing: Maximal assistance;Sit to/from stand   Upper Body Dressing : Set up;Sitting   Lower Body Dressing: Maximal assistance;Sit to/from stand   Toilet Transfer: Moderate assistance;Stand-pivot   Toileting- Clothing Manipulation and Hygiene: Contact guard assist;Sitting/lateral lean       Functional mobility during ADLs: Moderate assistance General ADL Comments: likely near her functional baseline     Vision Baseline Vision/History: 0 No visual deficits Vision Assessment?: No apparent visual deficits     Perception Perception: Not tested       Praxis Praxis: Not tested       Pertinent Vitals/Pain Pain Assessment Pain Assessment: No/denies pain     Extremity/Trunk Assessment Upper Extremity Assessment Upper Extremity Assessment: Generalized weakness   Lower  Extremity Assessment Lower Extremity Assessment: Defer to PT evaluation   Cervical / Trunk Assessment Cervical / Trunk Assessment: Kyphotic   Communication Communication Communication: No apparent difficulties   Cognition Arousal: Alert Behavior During Therapy: Flat affect Cognition: No family/caregiver present to determine baseline             OT - Cognition Comments: limited  historian, question accuracy. followed most simple commands.                 Following commands: Impaired Following commands impaired: Only follows one step commands consistently     Cueing  General Comments   Cueing Techniques: Verbal cues  VSS           Home Living Family/patient expects to be discharged to:: Assisted living                             Home Equipment: Rollator (4 wheels);BSC/3in1   Additional Comments: [facility]      Prior Functioning/Environment Prior Level of Function : Needs assist             Mobility Comments: transfers with AD adn staff assist, walks some with walker ADLs Comments: staff assists with ADLs    OT Problem List: Decreased strength;Decreased range of motion;Decreased activity tolerance;Impaired balance (sitting and/or standing);Decreased safety awareness;Decreased cognition;Decreased knowledge of use of DME or AE;Decreased knowledge of precautions   OT Treatment/Interventions: Self-care/ADL training;Therapeutic exercise;DME and/or AE instruction;Balance training;Patient/family education      OT Goals(Current goals can be found in the care plan section)   Acute Rehab OT Goals Patient Stated Goal: back home OT Goal Formulation: With patient Time For Goal Achievement: 05/06/24 Potential to Achieve Goals: Good ADL Goals Pt Will Perform Lower Body Dressing: with contact guard assist;sit to/from stand Pt Will Transfer to Toilet: with contact guard assist;stand pivot transfer;bedside commode Pt Will Perform Toileting - Clothing Manipulation and hygiene: with modified independence   OT Frequency:  Min 2X/week       AM-PAC OT 6 Clicks Daily Activity     Outcome Measure Help from another person eating meals?: A Little Help from another person taking care of personal grooming?: A Little Help from another person toileting, which includes using toliet, bedpan, or urinal?: A Lot Help from another  person bathing (including washing, rinsing, drying)?: A Lot Help from another person to put on and taking off regular upper body clothing?: A Little Help from another person to put on and taking off regular lower body clothing?: A Lot 6 Click Score: 15   End of Session Nurse Communication: Mobility status  Activity Tolerance: Patient tolerated treatment well Patient left: in bed;with call bell/phone within reach;with nursing/sitter in room  OT Visit Diagnosis: Unsteadiness on feet (R26.81);Other abnormalities of gait and mobility (R26.89);Muscle weakness (generalized) (M62.81)                Time: 8643-8584 OT Time Calculation (min): 19 min Charges:  OT General Charges $OT Visit: 1 Visit OT Evaluation $OT Eval Moderate Complexity: 1 Mod  Lucie Kendall, OTR/L Acute Rehabilitation Services Office [phone] Secure Chat Communication Preferred   Lucie JONETTA Kendall 04/22/2024, 3:10 PM

## 2024-04-22 NOTE — Consult Note (Signed)
 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 I am unclear her baseline. She is able to tell me previously that when she had bladder infection she has the dysuria/urgency. She hasn't had those symptoms  Furthermore there is really no other sign of sepsis and her albumin is at normal level 3.7 this admission  I am unclear if she truly has uti. Regardless for this case would categorize it as simple cystitis and 3 day of abx is enough. If she has no change lethargy would look elsewhere for cause    Plan: Finish 3 days meropenem  on 8/27 Maintain contact isolation precaution Ok to discharge from id standpoint when primary team clears Id will sign off Discussed with primary team      ------------------------------------------------ Principal Problem:   Urinary tract infection due to ESBL Klebsiella Active Problems:   Schizoaffective disorder (HCC)   Essential hypertension   History of CVA (cerebrovascular accident)   GAD (generalized anxiety disorder)   CKD stage 3a, GFR 45-59 ml/min (HCC)   Kidney lesion, native, left    HPI: Heidi Blevins is a 73 y.o. 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 , admitted for concern of uti  She has had headache/lethargy but no fever, chill, or uti sx otherwise She was seen in ED 8/20 and ucx esbl kleb pna (colonized with kleb from previous cx review), sent home on cephalexin  no improvement in sx and recalled   Afebrile no leukocytosis this admission   8/20 on presentation mild wbc up 11's but afebrile She complains of headache and no other sx otherwise  Lethargy reported in h&p  On meropenem  here unclear if any change in her presentation   No other complaint    Family History  Problem Relation Age of Onset   CVA Mother    Hypertension Mother    Lupus Sister    Hypertension Brother    Peptic Ulcer Disease Father    Colon cancer Neg Hx     Social History   Tobacco Use   Smoking status: Every Day    Current packs/day: 0.25    Average packs/day: 0.3 packs/day for 40.0 years (10.0 ttl pk-yrs)    Types: Cigarettes   Smokeless tobacco: Never  Substance Use Topics   Alcohol  use: No   Drug use: No    Allergies  Allergen Reactions   Sulfonamide Derivatives Hives        Sulfa Antibiotics Hives    Review of Systems: ROS All Other ROS was negative, except mentioned above   Past Medical History:  Diagnosis Date   Anxiety    Arrhythmia    Bifascicular block  Chronic diastolic heart failure (HCC)    Depression    Hypertension    Insomnia    Schizoaffective disorder    Scoliosis    Thyroid nodule 08/09/2020   Vitamin D  deficiency        Scheduled Meds:  artificial tears  1 drop Both Eyes TID   atorvastatin   40 mg Oral Daily   benztropine   1 mg Oral BID   clopidogrel   75 mg Oral Daily   enoxaparin  (LOVENOX ) injection  30 mg Subcutaneous Q24H   feeding supplement  237 mL Oral BID BM   fluPHENAZine   20 mg Oral BID   lidocaine   1-3 patch Transdermal Q24H   lithium  carbonate  150 mg Oral QHS   LORazepam   0.5 mg Oral QID   lurasidone   120 mg Oral Daily    melatonin  5 mg Oral QHS   metoprolol  tartrate  25 mg Oral BID   multivitamin with minerals  1 tablet Oral Daily   polyethylene glycol  17 g Oral Daily   sertraline   150 mg Oral Daily   simethicone   80 mg Oral QID   thiamine   100 mg Oral Daily   Continuous Infusions:  meropenem  (MERREM ) IV 1 g (04/22/24 1244)   PRN Meds:.acetaminophen  **OR** acetaminophen , Muscle Rub, ondansetron  (ZOFRAN ) IV, senna-docusate   OBJECTIVE: Blood pressure 124/70, pulse (!) 49, temperature 98.7 F (37.1 C), temperature source Oral, resp. rate 16, height 5' (1.524 m), weight 45.2 kg, SpO2 96%.  Physical Exam  General/constitutional: no distress, pleasant, conversant HEENT: Normocephalic, PER, Conj Clear, EOMI, Oropharynx clear Neck supple CV: rrr no mrg Lungs: clear to auscultation, normal respiratory effort Abd: Soft, Nontender Ext: no edema Skin: No Rash Neuro: nonfocal; oriented to self; cooperative; doesn't know where she is MSK: no peripheral joint swelling/tenderness/warmth; back spines nontender    Lab Results Lab Results  Component Value Date   WBC 9.7 04/22/2024   HGB 10.5 (L) 04/22/2024   HCT 32.6 (L) 04/22/2024   MCV 90.8 04/22/2024   PLT 313 04/22/2024    Lab Results  Component Value Date   CREATININE 1.19 (H) 04/22/2024   BUN 20 04/22/2024   NA 136 04/22/2024   K 4.0 04/22/2024   CL 108 04/22/2024   CO2 21 (L) 04/22/2024    Lab Results  Component Value Date   ALT 30 04/16/2024   AST 31 04/16/2024   ALKPHOS 140 (H) 04/16/2024   BILITOT 0.4 04/16/2024      Microbiology: Recent Results (from the past 240 hours)  Resp panel by RT-PCR (RSV, Flu A&B, Covid) Urine, Clean Catch     Status: None   Collection Time: 04/16/24 10:04 PM   Specimen: Urine, Clean Catch; Nasal Swab  Result Value Ref Range Status   SARS Coronavirus 2 by RT PCR NEGATIVE NEGATIVE Final    Comment: (NOTE) SARS-CoV-2 target nucleic acids are NOT DETECTED.  The SARS-CoV-2 RNA is generally  detectable in upper respiratory specimens during the acute phase of infection. The lowest concentration of SARS-CoV-2 viral copies this assay can detect is 138 copies/mL. A negative result does not preclude SARS-Cov-2 infection and should not be used as the sole basis for treatment or other patient management decisions. A negative result may occur with  improper specimen collection/handling, submission of specimen other than nasopharyngeal swab, presence of viral mutation(s) within the areas targeted by this assay, and inadequate number of viral copies(<138 copies/mL). A negative result must be combined with clinical observations, patient history, and epidemiological  information. The expected result is Negative.  Fact Sheet for Patients:  BloggerCourse.com  Fact Sheet for Healthcare Providers:  SeriousBroker.it  This test is no t yet approved or cleared by the [place]  FDA and  has been authorized for detection and/or diagnosis of SARS-CoV-2 by FDA under an Emergency Use Authorization (EUA). This EUA will remain  in effect (meaning this test can be used) for the duration of the COVID-19 declaration under Section 564(b)(1) of the Act, 21 U.S.C.section 360bbb-3(b)(1), unless the authorization is terminated  or revoked sooner.       Influenza A by PCR NEGATIVE NEGATIVE Final   Influenza B by PCR NEGATIVE NEGATIVE Final    Comment: (NOTE) The Xpert Xpress SARS-CoV-2/FLU/RSV plus assay is intended as an aid in the diagnosis of influenza from Nasopharyngeal swab specimens and should not be used as a sole basis for treatment. Nasal washings and aspirates are unacceptable for Xpert Xpress SARS-CoV-2/FLU/RSV testing.  Fact Sheet for Patients: BloggerCourse.com  Fact Sheet for Healthcare Providers: SeriousBroker.it  This test is not yet approved or cleared by the [place]  FDA  and has been authorized for detection and/or diagnosis of SARS-CoV-2 by FDA under an Emergency Use Authorization (EUA). This EUA will remain in effect (meaning this test can be used) for the duration of the COVID-19 declaration under Section 564(b)(1) of the Act, 21 U.S.C. section 360bbb-3(b)(1), unless the authorization is terminated or revoked.     Resp Syncytial Virus by PCR NEGATIVE NEGATIVE Final    Comment: (NOTE) Fact Sheet for Patients: BloggerCourse.com  Fact Sheet for Healthcare Providers: SeriousBroker.it  This test is not yet approved or cleared by the [place]  FDA and has been authorized for detection and/or diagnosis of SARS-CoV-2 by FDA under an Emergency Use Authorization (EUA). This EUA will remain in effect (meaning this test can be used) for the duration of the COVID-19 declaration under Section 564(b)(1) of the Act, 21 U.S.C. section 360bbb-3(b)(1), unless the authorization is terminated or revoked.  Performed at [facility], [address] [place], [place] [address]   Urine Culture     Status: Abnormal   Collection Time: 04/16/24 10:04 PM   Specimen: Urine, Clean Catch  Result Value Ref Range Status   Specimen Description   Final    URINE, CLEAN CATCH Performed at [facility], [address], [place], [place] [address]    Special Requests   Final    NONE Performed at [facility], [address] [place], [place] [address]    Culture (A)  Final    >=100,000 COLONIES/mL KLEBSIELLA PNEUMONIAE Confirmed Extended Spectrum Beta-Lactamase Producer (ESBL).  In bloodstream infections from ESBL organisms, carbapenems are preferred over piperacillin/tazobactam. They are shown to have a lower risk of mortality. 60,000 COLONIES/mL AEROCOCCUS SPECIES Standardized susceptibility testing for this organism is not available. Performed at [facility], [address] [place], [place] [address]    Report Status 04/19/2024 FINAL  Final   Organism ID, Bacteria KLEBSIELLA PNEUMONIAE (A)  Final      Susceptibility   Klebsiella pneumoniae - MIC*    AMPICILLIN >=32 RESISTANT Resistant     CEFAZOLIN  (URINE) Value in next row Resistant      >=32 RESISTANTThis is a modified FDA-approved test that has been validated and its performance characteristics determined by the reporting laboratory.  This laboratory is certified under the Clinical Laboratory Improvement Amendments CLIA as qualified to perform high complexity clinical laboratory testing.  CEFEPIME  Value in next row Resistant      >=32 RESISTANTThis is a modified FDA-approved test that has been validated and its performance characteristics determined by the reporting laboratory.  This laboratory is certified under the Clinical Laboratory Improvement Amendments CLIA as qualified to perform high complexity clinical laboratory testing.    ERTAPENEM  Value in next row Sensitive      >=32 RESISTANTThis is a modified FDA-approved test that has been validated and its performance characteristics determined by the reporting laboratory.  This laboratory is certified under the Clinical Laboratory Improvement Amendments CLIA as qualified to perform high complexity clinical laboratory testing.    CEFTRIAXONE  Value in next row Resistant      >=32 RESISTANTThis is a modified FDA-approved test that has been validated and its performance characteristics determined by the reporting laboratory.  This laboratory is certified under the Clinical Laboratory Improvement Amendments CLIA as qualified to perform high complexity clinical laboratory testing.    CIPROFLOXACIN Value in next row Intermediate      >=32 RESISTANTThis is a modified FDA-approved test that has been validated and its performance characteristics determined by the reporting laboratory.  This laboratory is certified under the Clinical Laboratory  Improvement Amendments CLIA as qualified to perform high complexity clinical laboratory testing.    GENTAMICIN Value in next row Resistant      >=32 RESISTANTThis is a modified FDA-approved test that has been validated and its performance characteristics determined by the reporting laboratory.  This laboratory is certified under the Clinical Laboratory Improvement Amendments CLIA as qualified to perform high complexity clinical laboratory testing.    NITROFURANTOIN Value in next row Resistant      >=32 RESISTANTThis is a modified FDA-approved test that has been validated and its performance characteristics determined by the reporting laboratory.  This laboratory is certified under the Clinical Laboratory Improvement Amendments CLIA as qualified to perform high complexity clinical laboratory testing.    TRIMETH/SULFA Value in next row Sensitive      >=32 RESISTANTThis is a modified FDA-approved test that has been validated and its performance characteristics determined by the reporting laboratory.  This laboratory is certified under the Clinical Laboratory Improvement Amendments CLIA as qualified to perform high complexity clinical laboratory testing.    AMPICILLIN/SULBACTAM Value in next row Resistant      >=32 RESISTANTThis is a modified FDA-approved test that has been validated and its performance characteristics determined by the reporting laboratory.  This laboratory is certified under the Clinical Laboratory Improvement Amendments CLIA as qualified to perform high complexity clinical laboratory testing.    PIP/TAZO Value in next row Sensitive ug/mL     <=4 SENSITIVEThis is a modified FDA-approved test that has been validated and its performance characteristics determined by the reporting laboratory.  This laboratory is certified under the Clinical Laboratory Improvement Amendments CLIA as qualified to perform high complexity clinical laboratory testing.    MEROPENEM  Value in next row Sensitive       <=4 SENSITIVEThis is a modified FDA-approved test that has been validated and its performance characteristics determined by the reporting laboratory.  This laboratory is certified under the Clinical Laboratory Improvement Amendments CLIA as qualified to perform high complexity clinical laboratory testing.    * >=100,000 COLONIES/mL KLEBSIELLA PNEUMONIAE     Serology:    Imaging: If present, new imagings (plain films, ct scans, and mri) have been personally visualized and interpreted; radiology reports have been reviewed. Decision making incorporated into the Impression / Recommendations.  8/24 ct head No  acute abnormality    Constance ONEIDA Passer, MD [facility] [phone] pager    04/22/2024, 3:30 PM

## 2024-04-22 NOTE — Evaluation (Signed)
 Physical Therapy Evaluation Patient Details Name: Heidi Blevins MRN: [id number] DOB: October 12, 1950 Today's Date: 04/22/2024  History of Present Illness  73 y.o. female who was seen in the ED on 04/16/2024 for lethargy, was diagnosed with UTI and discharged back to her SNF. Returns with urine culture growing ESBL. Medical history significant for hypertension, history of CVA, depression, anxiety, schizoaffective disorder, and CKD 3A  Clinical Impression   Pt admitted secondary to problem above with deficits below. PTA patient resides in ALF. She reports she required rollator and staff assist for transfers and walking short distances (sometimes walks to bathroom, mostly uses BSC).  Pt currently required min assist to sit EOB and refused to demonstrate a transfer due to fatigue and having shown OT earlier this afternoon. Anticipate pt is at or near her baseline and can return to ALF with staff assist. Acute PT will follow while hospitalized and further assess transfers and gait.  Anticipate patient will benefit from PT to address problems listed below. Will continue to follow acutely to maximize functional mobility, independence, and safety.           If plan is discharge home, recommend the following: A little help with walking and/or transfers   Can travel by private vehicle        Equipment Recommendations None recommended by PT  Recommendations for Other Services       Functional Status Assessment Patient has not had a recent decline in their functional status     Precautions / Restrictions Precautions Precautions: Fall Restrictions Weight Bearing Restrictions Per Provider Order: No      Mobility  Bed Mobility Overal bed mobility: Needs Assistance Bed Mobility: Sit to Supine, Supine to Sit     Supine to sit: Min assist, HOB elevated Sit to supine: Contact guard assist   General bed mobility comments: came to long-sitting and then turned to put legs over EOB     Transfers                   General transfer comment: refused due to fatigue and I already did this (saw OT earlier)    Ambulation/Gait                  Stairs            Wheelchair Mobility     Tilt Bed    Modified Rankin (Stroke Patients Only)       Balance Overall balance assessment: Needs assistance Sitting-balance support: Feet supported Sitting balance-Leahy Scale: Good                                       Pertinent Vitals/Pain Pain Assessment Pain Assessment: No/denies pain    Home Living Family/patient expects to be discharged to:: Assisted living                 Home Equipment: Rollator (4 wheels);BSC/3in1 Additional Comments: [facility]    Prior Function Prior Level of Function : Needs assist             Mobility Comments: transfers with AD and staff assist, walks some with walker (sometimes to bathroom, but usually uses BSC; cannot walk to dining room) ADLs Comments: staff assists with ADLs     Extremity/Trunk Assessment   Upper Extremity Assessment Upper Extremity Assessment: Defer to OT evaluation    Lower Extremity Assessment Lower Extremity Assessment:  RLE deficits/detail;LLE deficits/detail RLE Deficits / Details: AAROM WFL; hip flexion 2+, hip extension ~3, knee extension 3+ RLE Sensation:  (denies deficit) RLE Coordination: WNL LLE Deficits / Details: AAROM WFL; hip flexion 2+, hip extension ~3, knee extension 3+ LLE Sensation:  (denies deficits) LLE Coordination: WNL    Cervical / Trunk Assessment Cervical / Trunk Assessment: Kyphotic  Communication   Communication Communication: No apparent difficulties Factors Affecting Communication: Other (comment) (very low volume, soft voice)    Cognition Arousal: Alert Behavior During Therapy: Flat affect                           PT - Cognition Comments: not oriented to place or situation (time NT) Following  commands: Impaired Following commands impaired: Only follows one step commands consistently     Cueing Cueing Techniques: Verbal cues     General Comments General comments (skin integrity, edema, etc.): VSS    Exercises     Assessment/Plan    PT Assessment Patient needs continued PT services  PT Problem List Decreased strength;Decreased activity tolerance;Decreased balance;Decreased mobility       PT Treatment Interventions DME instruction;Gait training;Functional mobility training;Therapeutic activities;Therapeutic exercise;Balance training;Cognitive remediation;Patient/family education    PT Goals (Current goals can be found in the Care Plan section)  Acute Rehab PT Goals Patient Stated Goal: none stated PT Goal Formulation: With patient Time For Goal Achievement: 05/06/24 Potential to Achieve Goals: Fair    Frequency Min 2X/week     Co-evaluation               AM-PAC PT 6 Clicks Mobility  Outcome Measure Help needed turning from your back to your side while in a flat bed without using bedrails?: None Help needed moving from lying on your back to sitting on the side of a flat bed without using bedrails?: A Little Help needed moving to and from a bed to a chair (including a wheelchair)?: A Lot Help needed standing up from a chair using your arms (e.g., wheelchair or bedside chair)?: A Lot Help needed to walk in hospital room?: A Lot Help needed climbing 3-5 steps with a railing? : Total 6 Click Score: 14    End of Session   Activity Tolerance: Patient limited by fatigue Patient left: in bed;with call bell/phone within reach;with bed alarm set Nurse Communication: Mobility status PT Visit Diagnosis: Muscle weakness (generalized) (M62.81);Difficulty in walking, not elsewhere classified (R26.2)    Time: 8481-8471 PT Time Calculation (min) (ACUTE ONLY): 10 min   Charges:   PT Evaluation $PT Eval Low Complexity: 1 Low   PT General Charges $$ ACUTE PT  VISIT: 1 Visit          Macario RAMAN, PT Acute Rehabilitation Services  Office [phone]   Macario SHAUNNA Soja 04/22/2024, 3:44 PM

## 2024-04-23 DIAGNOSIS — N390 Urinary tract infection, site not specified: Secondary | ICD-10-CM | POA: Diagnosis not present

## 2024-04-23 DIAGNOSIS — N1831 Chronic kidney disease, stage 3a: Secondary | ICD-10-CM | POA: Diagnosis not present

## 2024-04-23 DIAGNOSIS — E43 Unspecified severe protein-calorie malnutrition: Secondary | ICD-10-CM

## 2024-04-23 DIAGNOSIS — F411 Generalized anxiety disorder: Secondary | ICD-10-CM | POA: Diagnosis not present

## 2024-04-23 LAB — CBC
HCT: 32.4 % — ABNORMAL LOW (ref 36.0–46.0)
Hemoglobin: 10.4 g/dL — ABNORMAL LOW (ref 12.0–15.0)
MCH: 29.1 pg (ref 26.0–34.0)
MCHC: 32.1 g/dL (ref 30.0–36.0)
MCV: 90.8 fL (ref 80.0–100.0)
Platelets: 346 K/uL (ref 150–400)
RBC: 3.57 MIL/uL — ABNORMAL LOW (ref 3.87–5.11)
RDW: 13.5 % (ref 11.5–15.5)
WBC: 10.2 K/uL (ref 4.0–10.5)
nRBC: 0 % (ref 0.0–0.2)

## 2024-04-23 LAB — BASIC METABOLIC PANEL WITH GFR
Anion gap: 8 (ref 5–15)
BUN: 27 mg/dL — ABNORMAL HIGH (ref 8–23)
CO2: 21 mmol/L — ABNORMAL LOW (ref 22–32)
Calcium: 10.1 mg/dL (ref 8.9–10.3)
Chloride: 110 mmol/L (ref 98–111)
Creatinine, Ser: 1.25 mg/dL — ABNORMAL HIGH (ref 0.44–1.00)
GFR, Estimated: 46 mL/min — ABNORMAL LOW (ref >60)
Glucose, Bld: 88 mg/dL (ref 70–99)
Potassium: 4.3 mmol/L (ref 3.5–5.1)
Sodium: 139 mmol/L (ref 135–145)

## 2024-04-23 MED ORDER — METOPROLOL TARTRATE 25 MG PO TABS
12.5000 mg | ORAL_TABLET | Freq: Two times a day (BID) | ORAL | Status: AC
Start: 2024-04-23 — End: ?

## 2024-04-23 NOTE — Discharge Summary (Signed)
 Physician Discharge Summary   Patient: Heidi Blevins MRN: [id number] DOB: [birth year]  Admit date:     04/20/2024  Discharge date: 04/23/24  Discharge Physician: Concepcion Riser   PCP: Pcp, No   Recommendations at discharge:    PCP follow up in 1 week.  Discharge Diagnoses: Principal Problem:   Urinary tract infection due to ESBL Klebsiella Active Problems:   History of CVA (cerebrovascular accident)   Schizoaffective disorder (HCC)   Essential hypertension   GAD (generalized anxiety disorder)   CKD stage 3a, GFR 45-59 ml/min (HCC)   Kidney lesion, native, left   Protein-calorie malnutrition, severe  Resolved Problems:   * No resolved hospital problems. *  Hospital Course: Heidi Blevins is a 73 y.o. female with medical history significant for hypertension, history of CVA, depression, anxiety, schizoaffective disorder, and CKD 3A who was seen in the ED on 04/16/2024 for lethargy, was diagnosed with UTI and discharged back to her SNF with Keflex  at that time, but now returns due to urine culture growing ESBL Klebsiella. Started on Meropenem  and admitted to [facility] service.   Assessment and Plan: ESBL Klebsiella UTI- Urine culture sensitivities reviewed. She does not have fever, white count normal. Continue contact precautions. ID advised 3 days of meropenem  and stop. She can return to assisted living facility with no further antibiotics.   Depression anxiety, schizoaffective disorder- Continue Latuda , fluphenazine , lithium , Ativan , Zoloft  and Cogentin  Delirium precautions.   History of CVA- Continue statin and Plavix .   Hypertension- BP stable.  Continue metoprolol .   CKD stage IIIa- Stable creatinine. Monitor renal function as outpatient. Avoid nephrotoxins.   Left kidney lesion followed by urology. Patient's prior admission in June reviewed. Cultures positive for ESBL, she had undergone bladder irrigation. Outpatient urology follow-up suggested.       Consultants: ID Procedures performed: none  Disposition: Assisted living Diet recommendation:  Discharge Diet Orders (From admission, onward)     Start     Ordered   04/23/24 0000  Diet - low sodium heart healthy        04/23/24 1054           Cardiac diet DISCHARGE MEDICATION: Allergies as of 04/23/2024       Reactions   Sulfonamide Derivatives Hives       Sulfa Antibiotics Hives        Medication List     STOP taking these medications    cephALEXin  500 MG capsule Commonly known as: KEFLEX    ibuprofen  600 MG tablet Commonly known as: ADVIL        TAKE these medications    benztropine  1 MG tablet Commonly known as: COGENTIN  Take 1 mg by mouth 2 (two) times daily.   clopidogrel  75 MG tablet Commonly known as: PLAVIX  Take 1 tablet (75 mg total) by mouth daily.   Cranberry 425 MG Caps Take 425 mg by mouth 2 (two) times a day.   estradiol 0.1 MG/GM vaginal cream Commonly known as: ESTRACE Place 1 Applicatorful vaginally See admin instructions. Insert 1g vaginally twice a week on Monday and Friday - at night.   FeroSul 325 (65 FE) MG tablet Generic drug: ferrous sulfate Take 325 mg by mouth daily.   fluPHENAZine  10 MG tablet Commonly known as: PROLIXIN  Take 20 mg by mouth in the morning and at bedtime.   Latuda  120 MG Tabs Generic drug: Lurasidone  HCl Take 120 mg by mouth daily.   lidocaine  4 % Place 1 patch onto the skin daily.   lithium   carbonate 150 MG capsule Take 1 capsule (150 mg total) by mouth at bedtime.   loratadine  10 MG tablet Commonly known as: CLARITIN  Take 10 mg by mouth daily.   LORazepam  0.5 MG tablet Commonly known as: ATIVAN  Take 1 tablet (0.5 mg total) by mouth in the morning, at noon, in the evening, and at bedtime.   melatonin 5 MG Tabs Take 5 mg by mouth at bedtime.   metoprolol  tartrate 25 MG tablet Commonly known as: LOPRESSOR  Take 0.5 tablets (12.5 mg total) by mouth 2 (two) times daily. What changed:  how much to take   multivitamins ther. w/minerals Tabs tablet Take 1 tablet by mouth daily.   polyethylene glycol powder 17 GM/SCOOP powder Commonly known as: GLYCOLAX /MIRALAX  Take 17 g by mouth daily.   senna 8.6 MG Tabs tablet Commonly known as: SENOKOT Take 17.2 mg by mouth at bedtime.   sertraline  100 MG tablet Commonly known as: ZOLOFT  Take 150 mg by mouth daily.   simethicone  80 MG chewable tablet Commonly known as: MYLICON Chew 1 tablet (80 mg total) by mouth 4 (four) times daily.        Discharge Exam: Filed Weights   04/20/24 1649 04/20/24 2126  Weight: 50 kg 45.2 kg      04/23/2024    8:07 AM 04/23/2024    5:33 AM 04/22/2024    9:57 PM  Vitals with BMI  Systolic 109 114 97  Diastolic 64 73 66  Pulse 65 71 84    General - Elderly African-American female, no apparent distress HEENT - PERRLA, EOMI, atraumatic head, non tender sinuses. Lung - Clear, no rales, rhonchi, wheezes. Heart - S1, S2 heard, no murmurs, rubs, no pedal edema. Abdomen - Soft, non tender, bowel sounds good Neuro - Alert, awake and oriented, non focal exam. Skin - Warm and dry. Contracted extremities, scoliosis noted.  Condition at discharge: stable  The results of significant diagnostics from this hospitalization (including imaging, microbiology, ancillary and laboratory) are listed below for reference.   Imaging Studies: CT Head Wo Contrast Result Date: 04/20/2024 CLINICAL DATA:  Mental status change, unknown cause Pt arrived via EMS from Facility CC Call back with positive urine culture from hospital visit last week. Current abx do not cover. Hx Dementia EXAM: CT HEAD WITHOUT CONTRAST TECHNIQUE: Contiguous axial images were obtained from the base of the skull through the vertex without intravenous contrast. RADIATION DOSE REDUCTION: This exam was performed according to the departmental dose-optimization program which includes automated exposure control, adjustment of the mA and/or kV  according to patient size and/or use of iterative reconstruction technique. COMPARISON:  CT head 09/22/2023 FINDINGS: Brain: Patchy and confluent areas of decreased attenuation are noted throughout the deep and periventricular white matter of the cerebral hemispheres bilaterally, compatible with chronic microvascular ischemic disease. No evidence of large-territorial acute infarction. No parenchymal hemorrhage. No mass lesion. No extra-axial collection. No mass effect or midline shift. No hydrocephalus. Basilar cisterns are patent. Vascular: No hyperdense vessel. Atherosclerotic calcifications are present within the cavernous internal carotid and vertebral arteries. Skull: No acute fracture or focal lesion. Sinuses/Orbits: Paranasal sinuses and mastoid air cells are clear. The orbits are unremarkable. Other: None. IMPRESSION: No acute intracranial abnormality. Electronically Signed   By: Morgane  Naveau M.D.   On: 04/20/2024 18:00    Microbiology: Results for orders placed or performed during the hospital encounter of 04/16/24  Resp panel by RT-PCR (RSV, Flu A&B, Covid) Urine, Clean Catch     Status: None  Collection Time: 04/16/24 10:04 PM   Specimen: Urine, Clean Catch; Nasal Swab  Result Value Ref Range Status   SARS Coronavirus 2 by RT PCR NEGATIVE NEGATIVE Final    Comment: (NOTE) SARS-CoV-2 target nucleic acids are NOT DETECTED.  The SARS-CoV-2 RNA is generally detectable in upper respiratory specimens during the acute phase of infection. The lowest concentration of SARS-CoV-2 viral copies this assay can detect is 138 copies/mL. A negative result does not preclude SARS-Cov-2 infection and should not be used as the sole basis for treatment or other patient management decisions. A negative result may occur with  improper specimen collection/handling, submission of specimen other than nasopharyngeal swab, presence of viral mutation(s) within the areas targeted by this assay, and inadequate  number of viral copies(<138 copies/mL). A negative result must be combined with clinical observations, patient history, and epidemiological information. The expected result is Negative.  Fact Sheet for Patients:  BloggerCourse.com  Fact Sheet for Healthcare Providers:  SeriousBroker.it  This test is no t yet approved or cleared by the [place]  FDA and  has been authorized for detection and/or diagnosis of SARS-CoV-2 by FDA under an Emergency Use Authorization (EUA). This EUA will remain  in effect (meaning this test can be used) for the duration of the COVID-19 declaration under Section 564(b)(1) of the Act, 21 U.S.C.section 360bbb-3(b)(1), unless the authorization is terminated  or revoked sooner.       Influenza A by PCR NEGATIVE NEGATIVE Final   Influenza B by PCR NEGATIVE NEGATIVE Final    Comment: (NOTE) The Xpert Xpress SARS-CoV-2/FLU/RSV plus assay is intended as an aid in the diagnosis of influenza from Nasopharyngeal swab specimens and should not be used as a sole basis for treatment. Nasal washings and aspirates are unacceptable for Xpert Xpress SARS-CoV-2/FLU/RSV testing.  Fact Sheet for Patients: BloggerCourse.com  Fact Sheet for Healthcare Providers: SeriousBroker.it  This test is not yet approved or cleared by the [place]  FDA and has been authorized for detection and/or diagnosis of SARS-CoV-2 by FDA under an Emergency Use Authorization (EUA). This EUA will remain in effect (meaning this test can be used) for the duration of the COVID-19 declaration under Section 564(b)(1) of the Act, 21 U.S.C. section 360bbb-3(b)(1), unless the authorization is terminated or revoked.     Resp Syncytial Virus by PCR NEGATIVE NEGATIVE Final    Comment: (NOTE) Fact Sheet for Patients: BloggerCourse.com  Fact Sheet for Healthcare  Providers: SeriousBroker.it  This test is not yet approved or cleared by the [place]  FDA and has been authorized for detection and/or diagnosis of SARS-CoV-2 by FDA under an Emergency Use Authorization (EUA). This EUA will remain in effect (meaning this test can be used) for the duration of the COVID-19 declaration under Section 564(b)(1) of the Act, 21 U.S.C. section 360bbb-3(b)(1), unless the authorization is terminated or revoked.  Performed at [facility], [address], [place], [place] [address]   Urine Culture     Status: Abnormal   Collection Time: 04/16/24 10:04 PM   Specimen: Urine, Clean Catch  Result Value Ref Range Status   Specimen Description   Final    URINE, CLEAN CATCH Performed at [facility], [address] [place], [place] [address]    Special Requests   Final    NONE Performed at [facility], [address] [place], [place] [address]    Culture (A)  Final    >=100,000 COLONIES/mL KLEBSIELLA PNEUMONIAE Confirmed Extended Spectrum Beta-Lactamase Producer (  ESBL).  In bloodstream infections from ESBL organisms, carbapenems are preferred over piperacillin/tazobactam. They are shown to have a lower risk of mortality. 60,000 COLONIES/mL AEROCOCCUS SPECIES Standardized susceptibility testing for this organism is not available. Performed at [facility], [address] [place], [place] [address]    Report Status 04/19/2024 FINAL  Final   Organism ID, Bacteria KLEBSIELLA PNEUMONIAE (A)  Final      Susceptibility   Klebsiella pneumoniae - MIC*    AMPICILLIN >=32 RESISTANT Resistant     CEFAZOLIN  (URINE) Value in next row Resistant      >=32 RESISTANTThis is a modified FDA-approved test that has been validated and its performance characteristics determined by the reporting laboratory.  This laboratory is certified under the Clinical Laboratory Improvement Amendments  CLIA as qualified to perform high complexity clinical laboratory testing.    CEFEPIME  Value in next row Resistant      >=32 RESISTANTThis is a modified FDA-approved test that has been validated and its performance characteristics determined by the reporting laboratory.  This laboratory is certified under the Clinical Laboratory Improvement Amendments CLIA as qualified to perform high complexity clinical laboratory testing.    ERTAPENEM  Value in next row Sensitive      >=32 RESISTANTThis is a modified FDA-approved test that has been validated and its performance characteristics determined by the reporting laboratory.  This laboratory is certified under the Clinical Laboratory Improvement Amendments CLIA as qualified to perform high complexity clinical laboratory testing.    CEFTRIAXONE  Value in next row Resistant      >=32 RESISTANTThis is a modified FDA-approved test that has been validated and its performance characteristics determined by the reporting laboratory.  This laboratory is certified under the Clinical Laboratory Improvement Amendments CLIA as qualified to perform high complexity clinical laboratory testing.    CIPROFLOXACIN Value in next row Intermediate      >=32 RESISTANTThis is a modified FDA-approved test that has been validated and its performance characteristics determined by the reporting laboratory.  This laboratory is certified under the Clinical Laboratory Improvement Amendments CLIA as qualified to perform high complexity clinical laboratory testing.    GENTAMICIN Value in next row Resistant      >=32 RESISTANTThis is a modified FDA-approved test that has been validated and its performance characteristics determined by the reporting laboratory.  This laboratory is certified under the Clinical Laboratory Improvement Amendments CLIA as qualified to perform high complexity clinical laboratory testing.    NITROFURANTOIN Value in next row Resistant      >=32 RESISTANTThis is a modified  FDA-approved test that has been validated and its performance characteristics determined by the reporting laboratory.  This laboratory is certified under the Clinical Laboratory Improvement Amendments CLIA as qualified to perform high complexity clinical laboratory testing.    TRIMETH/SULFA Value in next row Sensitive      >=32 RESISTANTThis is a modified FDA-approved test that has been validated and its performance characteristics determined by the reporting laboratory.  This laboratory is certified under the Clinical Laboratory Improvement Amendments CLIA as qualified to perform high complexity clinical laboratory testing.    AMPICILLIN/SULBACTAM Value in next row Resistant      >=32 RESISTANTThis is a modified FDA-approved test that has been validated and its performance characteristics determined by the reporting laboratory.  This laboratory is certified under the Clinical Laboratory Improvement Amendments CLIA as qualified to perform high complexity clinical laboratory testing.    PIP/TAZO Value in next row Sensitive ug/mL     <=  4 SENSITIVEThis is a modified FDA-approved test that has been validated and its performance characteristics determined by the reporting laboratory.  This laboratory is certified under the Clinical Laboratory Improvement Amendments CLIA as qualified to perform high complexity clinical laboratory testing.    MEROPENEM  Value in next row Sensitive      <=4 SENSITIVEThis is a modified FDA-approved test that has been validated and its performance characteristics determined by the reporting laboratory.  This laboratory is certified under the Clinical Laboratory Improvement Amendments CLIA as qualified to perform high complexity clinical laboratory testing.    * >=100,000 COLONIES/mL KLEBSIELLA PNEUMONIAE    Labs: CBC: Recent Labs  Lab 04/16/24 2252 04/20/24 1656 04/21/24 0536 04/22/24 0520 04/23/24 0444  WBC 11.9* 9.1 9.1 9.7 10.2  NEUTROABS 7.7 5.6  --   --   --   HGB  11.9* 11.5* 11.4* 10.5* 10.4*  HCT 39.6 36.9 35.6* 32.6* 32.4*  MCV 95.7 94.1 90.8 90.8 90.8  PLT 318 317 354 313 346   Basic Metabolic Panel: Recent Labs  Lab 04/16/24 2252 04/20/24 1656 04/21/24 0536 04/22/24 0520 04/23/24 0444  NA 137 137 138 136 139  K 3.8 4.1 3.7 4.0 4.3  CL 109 107 113* 108 110  CO2 18* 19* 22 21* 21*  GLUCOSE 91 83 93 79 88  BUN 28* 17 16 20  27*  CREATININE 1.21* 1.25* 1.04* 1.19* 1.25*  CALCIUM  11.0* 10.2 10.1 10.0 10.1   Liver Function Tests: Recent Labs  Lab 04/16/24 2252  AST 31  ALT 30  ALKPHOS 140*  BILITOT 0.4  PROT 8.5*  ALBUMIN 3.7   CBG: No results for input(s): GLUCAP in the last 168 hours.  Discharge time spent: 35 minutes.  Signed: Concepcion Riser, MD [facility] 04/23/2024

## 2024-04-23 NOTE — Plan of Care (Signed)
  Problem: Education: °Goal: Knowledge of General Education information will improve °Description: Including pain rating scale, medication(s)/side effects and non-pharmacologic comfort measures °Outcome: Progressing °  °Problem: Health Behavior/Discharge Planning: °Goal: Ability to manage health-related needs will improve °Outcome: Progressing °  °Problem: Activity: °Goal: Risk for activity intolerance will decrease °Outcome: Progressing °  °Problem: Nutrition: °Goal: Adequate nutrition will be maintained °Outcome: Progressing °  °Problem: Coping: °Goal: Level of anxiety will decrease °Outcome: Progressing °  °Problem: Elimination: °Goal: Will not experience complications related to bowel motility °Outcome: Progressing °Goal: Will not experience complications related to urinary retention °Outcome: Progressing °  °Problem: Safety: °Goal: Ability to remain free from injury will improve °Outcome: Progressing °  °Problem: Skin Integrity: °Goal: Risk for impaired skin integrity will decrease °Outcome: Progressing °  °

## 2024-04-23 NOTE — Progress Notes (Signed)
 Avelina DELENA Mau to be discharged [facility] per MD order. Patient verbalized understanding.  Report given to [place] and answered all her questions.    Skin clean, dry and intact without evidence of skin break down, no evidence of skin tears noted. IV catheter discontinued intact. Site without signs and symptoms of complications. Dressing and pressure applied. Pt denies pain at the site currently. No complaints noted.  Patient free of lines, drains, and wounds.   Discharge packet assembled. An After Visit Summary (AVS) was printed and given to the EMS personnel. Patient escorted via stretcher and discharged to [facility] via ambulance. Report called to accepting facility; all questions and concerns addressed.   Franciso Bodily, RN

## 2024-04-23 NOTE — TOC Transition Note (Signed)
 Transition of Care [facility]) - Discharge Note   Patient Details  Name: Heidi Blevins MRN: [id number] Date of Birth: 1951/08/13  Transition of Care [facility]) CM/SW Contact:  Lendia Dais, LCSWA Phone Number: 04/23/2024, 1:38 PM   Clinical Narrative: Pt is disoriented times 2. CSW called the brother Birdia and left a VM stating that the patient will return to [facility] today by ambulance.   CSW scheduled PTAR at 1415.  No further ICM needs.    Final next level of care: Assisted Living Barriers to Discharge: Barriers Resolved   Patient Goals and CMS Choice            Discharge Placement              Patient chooses bed at:  [facility]) Patient to be transferred to facility by: PTAR Name of family member notified: Burnie Hank Patient and family notified of of transfer: 04/23/24  Discharge Plan and Services Additional resources added to the After Visit Summary for                                       Social Drivers of Health (SDOH) Interventions SDOH Screenings   Food Insecurity: Patient Unable To Answer (04/21/2024)  Housing: Patient Unable To Answer (04/21/2024)  Transportation Needs: Patient Unable To Answer (04/21/2024)  Utilities: Patient Unable To Answer (04/21/2024)  Social Connections: Patient Unable To Answer (04/21/2024)  Tobacco Use: High Risk (04/20/2024)     Readmission Risk Interventions    09/24/2023   12:58 PM  Readmission Risk Prevention Plan  Post Dischage Appt Complete  Medication Screening Complete  Transportation Screening Complete

## 2024-04-23 NOTE — NC FL2 (Addendum)
 [place]  MEDICAID FL2 LEVEL OF CARE FORM     IDENTIFICATION  Patient Name: Heidi Blevins Birthdate: Apr 23, 1951 Sex: female Admission Date (Current Location): 04/20/2024  [facility] and [place] Number:  Producer, television/film/video and Address:  [facility] [place], [address], [place], [place] [address]      Provider Number: [id number]  Attending Physician Name and Address:  Darci Pore, MD  Relative Name and Phone Number:  Birdia (son) [phone]    Current Level of Care: Hospital Recommended Level of Care:   Prior Approval Number:    Date Approved/Denied:   PASRR Number: [id number] Expired 03/19/2024  Discharge Plan: Other (Comment) (ALF)    Current Diagnoses: Patient Active Problem List   Diagnosis Date Noted   Protein-calorie malnutrition, severe 04/22/2024   Urinary tract infection due to ESBL Klebsiella 04/20/2024   Bladder obstruction 02/10/2024   Hydroureteronephrosis 02/10/2024   Anemia 02/10/2024   Bladder hemorrhage 02/10/2024   ABLA (acute blood loss anemia) 02/10/2024   CKD stage 3a, GFR 45-59 ml/min (HCC) 09/23/2023   Pericardial effusion 09/23/2023   Kidney lesion, native, left 09/23/2023   Acute cystitis 04/23/2022   Chronic diastolic CHF (congestive heart failure) (HCC) 04/23/2022   GAD (generalized anxiety disorder) 04/23/2022   HLD (hyperlipidemia) 04/23/2022   Polypharmacy 08/09/2020   Acute CVA (cerebrovascular accident) (HCC) 08/09/2020   Thyroid nodule 08/09/2020   History of CVA (cerebrovascular accident) 08/08/2020   Bradycardia    Acute metabolic encephalopathy    Sepsis secondary to UTI (HCC) 03/20/2019   Closed displaced fracture of left femoral neck (HCC) 09/26/2018   AKI (acute kidney injury) (HCC) 06/13/2016   UTI (urinary tract infection) 06/13/2016   Essential hypertension 06/13/2016   Urinary tract infection without hematuria    Complicated UTI (urinary tract infection) 05/03/2016   Hypercalcemia  05/03/2016   Dehydration 05/03/2016   Acute encephalopathy 05/03/2016   Cerebrovascular disease 05/03/2016   Schizoaffective disorder (HCC) 05/03/2016   Bifascicular block 05/03/2016    Orientation RESPIRATION BLADDER Height & Weight     Self, Situation  Normal Continent Weight: 99 lb 10.4 oz (45.2 kg) Height:  5' (152.4 cm)  BEHAVIORAL SYMPTOMS/MOOD NEUROLOGICAL BOWEL NUTRITION STATUS      Continent Diet (regular, no salt added)  AMBULATORY STATUS COMMUNICATION OF NEEDS Skin   Extensive Assist Verbally Normal                       Personal Care Assistance Level of Assistance  Bathing, Feeding, Dressing Bathing Assistance: Maximum assistance Feeding assistance: Independent Dressing Assistance: Maximum assistance     Functional Limitations Info  Sight, Hearing, Speech Sight Info: Adequate Hearing Info: Impaired Speech Info: Adequate    SPECIAL CARE FACTORS FREQUENCY                       Contractures      Additional Factors Info                    Discharge Medications:  Expand All Collapse All      Physician Discharge Summary    Patient: Heidi Blevins MRN: [id number] DOB: August 05, 1951  Admit date:     04/20/2024  Discharge date: 04/23/24  Discharge Physician: Pore Darci    PCP: Pcp, No    Recommendations at discharge:     PCP follow up in 1 week.   Discharge Diagnoses: Principal Problem:   Urinary tract infection due  to ESBL Klebsiella Active Problems:   History of CVA (cerebrovascular accident)   Schizoaffective disorder (HCC)   Essential hypertension   GAD (generalized anxiety disorder)   CKD stage 3a, GFR 45-59 ml/min (HCC)   Kidney lesion, native, left   Protein-calorie malnutrition, severe   Resolved Problems:   * No resolved hospital problems. *   Hospital Course: KRITHIKA TOME is a 73 y.o. female with medical history significant for hypertension, history of CVA, depression, anxiety, schizoaffective  disorder, and CKD 3A who was seen in the ED on 04/16/2024 for lethargy, was diagnosed with UTI and discharged back to her SNF with Keflex  at that time, but now returns due to urine culture growing ESBL Klebsiella. Started on Meropenem  and admitted to TRH service.   Assessment and Plan: ESBL Klebsiella UTI- Urine culture sensitivities reviewed. She does not have fever, white count normal. Continue contact precautions. ID advised 3 days of meropenem  and stop. She can return to assisted living facility with no further antibiotics.   Depression anxiety, schizoaffective disorder- Continue Latuda , fluphenazine , lithium , Ativan , Zoloft  and Cogentin  Delirium precautions.   History of CVA- Continue statin and Plavix .   Hypertension- BP stable.  Continue metoprolol .   CKD stage IIIa- Stable creatinine. Monitor renal function as outpatient. Avoid nephrotoxins.   Left kidney lesion followed by urology. Patient's prior admission in June reviewed. Cultures positive for ESBL, she had undergone bladder irrigation. Outpatient urology follow-up suggested.         Consultants: ID Procedures performed: none  Disposition: Assisted living Diet recommendation:  Discharge Diet Orders (From admission, onward)        Start     Ordered    04/23/24 0000   Diet - low sodium heart healthy        04/23/24 1054                Cardiac diet DISCHARGE MEDICATION: Allergies as of 04/23/2024         Reactions    Sulfonamide Derivatives Hives         Sulfa Antibiotics Hives            Medication List       STOP taking these medications     cephALEXin  500 MG capsule Commonly known as: KEFLEX     ibuprofen  600 MG tablet Commonly known as: ADVIL            TAKE these medications     benztropine  1 MG tablet Commonly known as: COGENTIN  Take 1 mg by mouth 2 (two) times daily.    clopidogrel  75 MG tablet Commonly known as: PLAVIX  Take 1 tablet (75 mg total) by mouth daily.     Cranberry 425 MG Caps Take 425 mg by mouth 2 (two) times a day.    estradiol 0.1 MG/GM vaginal cream Commonly known as: ESTRACE Place 1 Applicatorful vaginally See admin instructions. Insert 1g vaginally twice a week on Monday and Friday - at night.    FeroSul 325 (65 FE) MG tablet Generic drug: ferrous sulfate Take 325 mg by mouth daily.    fluPHENAZine  10 MG tablet Commonly known as: PROLIXIN  Take 20 mg by mouth in the morning and at bedtime.    Latuda  120 MG Tabs Generic drug: Lurasidone  HCl Take 120 mg by mouth daily.    lidocaine  4 % Place 1 patch onto the skin daily.    lithium  carbonate 150 MG capsule Take 1 capsule (150 mg total) by mouth at bedtime.    loratadine   10 MG tablet Commonly known as: CLARITIN  Take 10 mg by mouth daily.    LORazepam  0.5 MG tablet Commonly known as: ATIVAN  Take 1 tablet (0.5 mg total) by mouth in the morning, at noon, in the evening, and at bedtime.    melatonin 5 MG Tabs Take 5 mg by mouth at bedtime.    metoprolol  tartrate 25 MG tablet Commonly known as: LOPRESSOR  Take 0.5 tablets (12.5 mg total) by mouth 2 (two) times daily. What changed: how much to take    multivitamins ther. w/minerals Tabs tablet Take 1 tablet by mouth daily.    polyethylene glycol powder 17 GM/SCOOP powder Commonly known as: GLYCOLAX /MIRALAX  Take 17 g by mouth daily.    senna 8.6 MG Tabs tablet Commonly known as: SENOKOT Take 17.2 mg by mouth at bedtime.    sertraline  100 MG tablet Commonly known as: ZOLOFT  Take 150 mg by mouth daily.    simethicone  80 MG chewable tablet Commonly known as: MYLICON Chew 1 tablet (80 mg total) by mouth 4 (four) times daily.         Relevant Imaging Results:  Relevant Lab Results:   Additional Information SSN [id number]  [place], LCSWA

## 2024-04-29 ENCOUNTER — Encounter: Payer: Self-pay | Admitting: Medical Oncology

## 2024-05-14 ENCOUNTER — Encounter: Admitting: Internal Medicine

## 2024-05-14 NOTE — Progress Notes (Deleted)
 Office Visit Note  Patient: Heidi Blevins             Date of Birth: 1950-12-30           MRN: [id number]             PCP: Pcp, No Referring: Thayil, Irene T, PA-C Visit Date: 05/14/2024 Occupation: Data Unavailable  Subjective:  No chief complaint on file.   History of Present Illness: Heidi Blevins is a 73 y.o. female ***     Activities of Daily Living:  Patient reports morning stiffness for *** {minute/hour:19697}.   Patient {ACTIONS;DENIES/REPORTS:21021675::Denies} nocturnal pain.  Difficulty dressing/grooming: {ACTIONS;DENIES/REPORTS:21021675::Denies} Difficulty climbing stairs: {ACTIONS;DENIES/REPORTS:21021675::Denies} Difficulty getting out of chair: {ACTIONS;DENIES/REPORTS:21021675::Denies} Difficulty using hands for taps, buttons, cutlery, and/or writing: {ACTIONS;DENIES/REPORTS:21021675::Denies}  No Rheumatology ROS completed.   PMFS History:  Patient Active Problem List   Diagnosis Date Noted   Protein-calorie malnutrition, severe 04/22/2024   Urinary tract infection due to ESBL Klebsiella 04/20/2024   Bladder obstruction 02/10/2024   Hydroureteronephrosis 02/10/2024   Anemia 02/10/2024   Bladder hemorrhage 02/10/2024   ABLA (acute blood loss anemia) 02/10/2024   CKD stage 3a, GFR 45-59 ml/min (HCC) 09/23/2023   Pericardial effusion 09/23/2023   Kidney lesion, native, left 09/23/2023   Acute cystitis 04/23/2022   Chronic diastolic CHF (congestive heart failure) (HCC) 04/23/2022   GAD (generalized anxiety disorder) 04/23/2022   HLD (hyperlipidemia) 04/23/2022   Polypharmacy 08/09/2020   Acute CVA (cerebrovascular accident) (HCC) 08/09/2020   Thyroid nodule 08/09/2020   History of CVA (cerebrovascular accident) 08/08/2020   Bradycardia    Acute metabolic encephalopathy    Sepsis secondary to UTI (HCC) 03/20/2019   Closed displaced fracture of left femoral neck (HCC) 09/26/2018   AKI (acute kidney injury) (HCC) 06/13/2016   UTI  (urinary tract infection) 06/13/2016   Essential hypertension 06/13/2016   Urinary tract infection without hematuria    Complicated UTI (urinary tract infection) 05/03/2016   Hypercalcemia 05/03/2016   Dehydration 05/03/2016   Acute encephalopathy 05/03/2016   Cerebrovascular disease 05/03/2016   Schizoaffective disorder (HCC) 05/03/2016   Bifascicular block 05/03/2016    Past Medical History:  Diagnosis Date   Anxiety    Arrhythmia    Bifascicular block    Chronic diastolic heart failure (HCC)    Depression    Hypertension    Insomnia    Schizoaffective disorder    Scoliosis    Thyroid nodule 08/09/2020   Vitamin D  deficiency     Family History  Problem Relation Age of Onset   CVA Mother    Hypertension Mother    Lupus Sister    Hypertension Brother    Peptic Ulcer Disease Father    Colon cancer Neg Hx    Past Surgical History:  Procedure Laterality Date   BACK SURGERY     as a child after she fell out of a tree   TONSILLECTOMY     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  Normally independent of ADLs and with ambulation.     There is no immunization history for the selected administration types on file for this patient.   Objective: Vital Signs: There were no vitals taken for this visit.   Physical Exam   Musculoskeletal Exam: ***  CDAI Exam: CDAI Score: -- Patient Global: --; Provider Global: -- Swollen: --; Tender: -- Joint Exam 05/14/2024   No joint exam has been documented  for this visit   There is currently no information documented on the homunculus. Go to the Rheumatology activity and complete the homunculus joint exam.  Investigation: No additional findings.  Imaging: CT Head Wo Contrast Result Date: 04/20/2024 CLINICAL DATA:  Mental status change, unknown cause Pt arrived via EMS from Facility CC Call back with positive urine culture from hospital visit last week. Current abx do not cover. Hx Dementia EXAM: CT HEAD WITHOUT CONTRAST TECHNIQUE: Contiguous axial images were obtained from the base of the skull through the vertex without intravenous contrast. RADIATION DOSE REDUCTION: This exam was performed according to the departmental dose-optimization program which includes automated exposure control, adjustment of the mA and/or kV according to patient size and/or use of iterative reconstruction technique. COMPARISON:  CT head 09/22/2023 FINDINGS: Brain: Patchy and confluent areas of decreased attenuation are noted throughout the deep and periventricular white matter of the cerebral hemispheres bilaterally, compatible with chronic microvascular ischemic disease. No evidence of large-territorial acute infarction. No parenchymal hemorrhage. No mass lesion. No extra-axial collection. No mass effect or midline shift. No hydrocephalus. Basilar cisterns are patent. Vascular: No hyperdense vessel. Atherosclerotic calcifications are present within the cavernous internal carotid and vertebral arteries. Skull: No acute fracture or focal lesion. Sinuses/Orbits: Paranasal sinuses and mastoid air cells are clear. The orbits are unremarkable. Other: None. IMPRESSION: No acute intracranial abnormality. Electronically Signed   By: Morgane  Naveau M.D.   On: 04/20/2024 18:00    Recent Labs: Lab Results  Component Value Date   WBC 10.2 04/23/2024   HGB 10.4 (L) 04/23/2024   PLT 346 04/23/2024   NA 139 04/23/2024   K 4.3 04/23/2024   CL 110 04/23/2024   CO2 21 (L) 04/23/2024    GLUCOSE 88 04/23/2024   BUN 27 (H) 04/23/2024   CREATININE 1.25 (H) 04/23/2024   BILITOT 0.4 04/16/2024   ALKPHOS 140 (H) 04/16/2024   AST 31 04/16/2024   ALT 30 04/16/2024   PROT 8.5 (H) 04/16/2024   ALBUMIN 3.7 04/16/2024   CALCIUM  10.1 04/23/2024   GFRAA >60 12/05/2019    Speciality Comments: No specialty comments available.  Procedures:  No procedures performed Allergies: Sulfonamide derivatives and Sulfa antibiotics   Assessment / Plan:     Visit Diagnoses: No diagnosis found.  Orders: No orders of the defined types were placed in this encounter.  No orders of the defined types were placed in this encounter.   Face-to-face time spent with patient was *** minutes. Greater than 50% of time was spent in counseling and coordination of care.  Follow-Up Instructions: No follow-ups on file.   Lonni LELON Ester, MD  Note - This record has been created using [employer].  Chart creation errors have been sought, but may not always  have been located. Such creation errors do not reflect on  the standard of medical care.

## 2024-05-29 ENCOUNTER — Other Ambulatory Visit: Payer: Self-pay | Admitting: Hematology and Oncology

## 2024-05-29 DIAGNOSIS — D464 Refractory anemia, unspecified: Secondary | ICD-10-CM

## 2024-05-30 ENCOUNTER — Inpatient Hospital Stay

## 2024-06-02 ENCOUNTER — Other Ambulatory Visit: Payer: Self-pay

## 2024-06-02 ENCOUNTER — Emergency Department (HOSPITAL_COMMUNITY)

## 2024-06-02 ENCOUNTER — Emergency Department (HOSPITAL_COMMUNITY)
Admission: EM | Admit: 2024-06-02 | Discharge: 2024-06-03 | Disposition: A | Discharge status: Skilled Nursing Facility | Attending: Emergency Medicine | Admitting: Emergency Medicine

## 2024-06-02 ENCOUNTER — Encounter (HOSPITAL_COMMUNITY): Payer: Self-pay

## 2024-06-02 DIAGNOSIS — F0390 Unspecified dementia without behavioral disturbance: Secondary | ICD-10-CM | POA: Insufficient documentation

## 2024-06-02 DIAGNOSIS — R030 Elevated blood-pressure reading, without diagnosis of hypertension: Secondary | ICD-10-CM

## 2024-06-02 DIAGNOSIS — R531 Weakness: Secondary | ICD-10-CM | POA: Diagnosis not present

## 2024-06-02 DIAGNOSIS — I10 Essential (primary) hypertension: Secondary | ICD-10-CM | POA: Diagnosis not present

## 2024-06-02 DIAGNOSIS — N390 Urinary tract infection, site not specified: Secondary | ICD-10-CM | POA: Diagnosis present

## 2024-06-02 DIAGNOSIS — Z8659 Personal history of other mental and behavioral disorders: Secondary | ICD-10-CM

## 2024-06-02 LAB — CBC WITH DIFFERENTIAL/PLATELET
Abs Immature Granulocytes: 0.03 K/uL (ref 0.00–0.07)
Basophils Absolute: 0.1 K/uL (ref 0.0–0.1)
Basophils Relative: 1 %
Eosinophils Absolute: 0.3 K/uL (ref 0.0–0.5)
Eosinophils Relative: 3 %
HCT: 36.2 % (ref 36.0–46.0)
Hemoglobin: 11.3 g/dL — ABNORMAL LOW (ref 12.0–15.0)
Immature Granulocytes: 0 %
Lymphocytes Relative: 23 %
Lymphs Abs: 2.1 K/uL (ref 0.7–4.0)
MCH: 29.4 pg (ref 26.0–34.0)
MCHC: 31.2 g/dL (ref 30.0–36.0)
MCV: 94.3 fL (ref 80.0–100.0)
Monocytes Absolute: 0.8 K/uL (ref 0.1–1.0)
Monocytes Relative: 9 %
Neutro Abs: 5.8 K/uL (ref 1.7–7.7)
Neutrophils Relative %: 64 %
Platelets: 327 K/uL (ref 150–400)
RBC: 3.84 MIL/uL — ABNORMAL LOW (ref 3.87–5.11)
RDW: 13.8 % (ref 11.5–15.5)
WBC: 9.2 K/uL (ref 4.0–10.5)
nRBC: 0 % (ref 0.0–0.2)

## 2024-06-02 LAB — URINALYSIS, ROUTINE W REFLEX MICROSCOPIC
Bilirubin Urine: NEGATIVE
Glucose, UA: NEGATIVE mg/dL
Ketones, ur: NEGATIVE mg/dL
Nitrite: NEGATIVE
Protein, ur: 100 mg/dL — AB
Specific Gravity, Urine: 1.005 (ref 1.005–1.030)
WBC, UA: >50 WBC/hpf (ref 0–5)
pH: 7 (ref 5.0–8.0)

## 2024-06-02 LAB — COMPREHENSIVE METABOLIC PANEL WITH GFR
ALT: 23 U/L (ref 0–44)
AST: 30 U/L (ref 15–41)
Albumin: 3.5 g/dL (ref 3.5–5.0)
Alkaline Phosphatase: 118 U/L (ref 38–126)
Anion gap: 10 (ref 5–15)
BUN: 16 mg/dL (ref 8–23)
CO2: 19 mmol/L — ABNORMAL LOW (ref 22–32)
Calcium: 10.5 mg/dL — ABNORMAL HIGH (ref 8.9–10.3)
Chloride: 109 mmol/L (ref 98–111)
Creatinine, Ser: 0.93 mg/dL (ref 0.44–1.00)
GFR, Estimated: >60 mL/min (ref >60)
Glucose, Bld: 86 mg/dL (ref 70–99)
Potassium: 4 mmol/L (ref 3.5–5.1)
Sodium: 138 mmol/L (ref 135–145)
Total Bilirubin: 0.6 mg/dL (ref 0.0–1.2)
Total Protein: 7.2 g/dL (ref 6.5–8.1)

## 2024-06-02 LAB — CBG MONITORING, ED: Glucose-Capillary: 83 mg/dL (ref 70–99)

## 2024-06-02 MED ORDER — METOPROLOL TARTRATE 25 MG PO TABS
12.5000 mg | ORAL_TABLET | Freq: Once | ORAL | Status: AC
  Administered 2024-06-02: 12.5 mg via ORAL
  Filled 2024-06-02: qty 1

## 2024-06-02 MED ORDER — SODIUM CHLORIDE 0.9 % IV SOLN
1.0000 g | Freq: Once | INTRAVENOUS | Status: AC
  Administered 2024-06-02: 1 g via INTRAVENOUS
  Filled 2024-06-02: qty 10

## 2024-06-02 MED ORDER — LACTATED RINGERS IV BOLUS
1000.0000 mL | Freq: Once | INTRAVENOUS | Status: AC
  Administered 2024-06-02: 1000 mL via INTRAVENOUS

## 2024-06-02 MED ORDER — LEVOFLOXACIN 500 MG PO TABS
500.0000 mg | ORAL_TABLET | Freq: Every day | ORAL | 0 refills | Status: AC
  Filled 2024-06-02: qty 5, 5d supply, fill #0

## 2024-06-02 NOTE — Discharge Instructions (Addendum)
 It was our pleasure to provide your ER care today - we hope that you feel better.  Drink plenty of fluids/stay well hydrated.   Take antibiotic as prescribed for possible urine infection.  Follow up closely with primary care doctor in the coming week for recheck. Also have your blood pressure rechecked then, as it is high today.  Return to ER if worse, new symptoms, fevers, new/severe pain, persistent vomiting, trouble breathing, or other concern.

## 2024-06-02 NOTE — ED Provider Notes (Signed)
 [place] EMERGENCY DEPARTMENT AT [facility] Provider Note   CSN: [id number] Arrival date & time: 06/02/24  1806     Patient presents with: Weakness   Heidi Blevins is a 73 y.o. female.   Patient with hx dementia, presents from SNF concerned for possible uti.  Pt is alert and content appearing, is a very limited historian/dementia, and currently denies any specific pain or other specific complaints. No report of vomiting. No report of trauma/fall. No report of fevers.   The history is provided by the patient, medical records and the EMS personnel. The history is limited by the condition of the patient.  Urinary Frequency Pertinent negatives include no chest pain, no abdominal pain, no headaches and no shortness of breath.       Prior to Admission medications   Medication Sig Start Date End Date Taking? Authorizing Provider  benztropine  (COGENTIN ) 1 MG tablet Take 1 mg by mouth 2 (two) times daily.    [provider]  clopidogrel  (PLAVIX ) 75 MG tablet Take 1 tablet (75 mg total) by mouth daily. 02/18/24   Vernon Ranks, MD  Cranberry 425 MG CAPS Take 425 mg by mouth 2 (two) times a day.    [provider]  estradiol (ESTRACE) 0.1 MG/GM vaginal cream Place 1 Applicatorful vaginally See admin instructions. Insert 1g vaginally twice a week on Monday and Friday - at night. 01/29/24   [provider]  ferrous sulfate (FEROSUL) 325 (65 FE) MG tablet Take 325 mg by mouth daily.    [provider]  fluPHENAZine  (PROLIXIN ) 10 MG tablet Take 20 mg by mouth in the morning and at bedtime.    [provider]  lidocaine  4 % Place 1 patch onto the skin daily.    [provider]  lithium  carbonate 150 MG capsule Take 1 capsule (150 mg total) by mouth at bedtime. 04/25/22 06/07/24  Christobal Guadalajara, MD  loratadine  (CLARITIN ) 10 MG tablet Take 10 mg by mouth daily.    [provider]  LORazepam  (ATIVAN ) 0.5 MG tablet Take 1 tablet  (0.5 mg total) by mouth in the morning, at noon, in the evening, and at bedtime. 02/15/24   Pahwani, Ravi, MD  Lurasidone  HCl (LATUDA ) 120 MG TABS Take 120 mg by mouth daily.    [provider]  melatonin 5 MG TABS Take 5 mg by mouth at bedtime.    [provider]  metoprolol  tartrate (LOPRESSOR ) 25 MG tablet Take 0.5 tablets (12.5 mg total) by mouth 2 (two) times daily. 04/23/24   Darci Pore, MD  Multiple Vitamins-Minerals (MULTIVITAMINS THER. W/MINERALS) TABS tablet Take 1 tablet by mouth daily.    [provider]  polyethylene glycol powder (GLYCOLAX /MIRALAX ) 17 GM/SCOOP powder Take 17 g by mouth daily.    [provider]  senna (SENOKOT) 8.6 MG TABS tablet Take 17.2 mg by mouth at bedtime.    [provider]  sertraline  (ZOLOFT ) 100 MG tablet Take 150 mg by mouth daily.    [provider]  simethicone  (MYLICON) 80 MG chewable tablet Chew 1 tablet (80 mg total) by mouth 4 (four) times daily. 08/13/20   Tobie Yetta HERO, MD    Allergies: Sulfonamide derivatives and Sulfa antibiotics    Review of Systems  Unable to perform ROS: Dementia  Constitutional:  Negative for fever.  HENT:  Negative for sore throat.   Respiratory:  Negative for shortness of breath.   Cardiovascular:  Negative for chest pain.  Gastrointestinal:  Negative for abdominal pain and vomiting.  Genitourinary:  Negative for dysuria.  Musculoskeletal:  Negative for back pain and neck pain.  Neurological:  Negative for headaches.    Updated Vital Signs BP (!) 179/119   Pulse 86   Temp 98.6 F (37 C) (Oral)   Resp 13   Ht 1.524 m (5')   Wt 45 kg   SpO2 100%   BMI 19.38 kg/m   Physical Exam Vitals and nursing note reviewed.  Constitutional:      Appearance: Normal appearance. She is well-developed.  HENT:     Head: Atraumatic.     Comments: No sinus or temporal tenderness.     Nose: Nose normal.     Mouth/Throat:     Mouth: Mucous membranes are  moist.     Pharynx: Oropharynx is clear. No oropharyngeal exudate or posterior oropharyngeal erythema.  Eyes:     General: No scleral icterus.    Conjunctiva/sclera: Conjunctivae normal.     Pupils: Pupils are equal, round, and reactive to light.  Neck:     Vascular: No carotid bruit.     Trachea: No tracheal deviation.     Comments: Trachea midline, thyroid not grossly enlarged or tender. No neck stiffness or rigidity.  Cardiovascular:     Rate and Rhythm: Normal rate and regular rhythm.     Pulses: Normal pulses.     Heart sounds: Normal heart sounds. No murmur heard.    No friction rub. No gallop.  Pulmonary:     Effort: Pulmonary effort is normal. No respiratory distress.     Breath sounds: Normal breath sounds.  Abdominal:     General: Bowel sounds are normal. There is no distension.     Palpations: Abdomen is soft.     Tenderness: There is no abdominal tenderness.  Genitourinary:    Comments: No cva tenderness.  Musculoskeletal:        General: No swelling or tenderness.     Cervical back: Normal range of motion and neck supple. No rigidity. No muscular tenderness.     Right lower leg: No edema.     Left lower leg: No edema.  Skin:    General: Skin is warm and dry.     Findings: No rash.  Neurological:     Mental Status: She is alert.     Comments: Alert, content, responds to some questions. Motor/sens grossly intact bil.   Psychiatric:        Mood and Affect: Mood normal.     (all labs ordered are listed, but only abnormal results are displayed) Results for orders placed or performed during the hospital encounter of 06/02/24  CBC with Differential   Collection Time: 06/02/24  6:29 PM  Result Value Ref Range   WBC 9.2 4.0 - 10.5 K/uL   RBC 3.84 (L) 3.87 - 5.11 MIL/uL   Hemoglobin 11.3 (L) 12.0 - 15.0 g/dL   HCT 63.7 63.9 - 53.9 %   MCV 94.3 80.0 - 100.0 fL   MCH 29.4 26.0 - 34.0 pg   MCHC 31.2 30.0 - 36.0 g/dL   RDW 86.1 88.4 - 84.4 %   Platelets 327 150 -  400 K/uL   nRBC 0.0 0.0 - 0.2 %   Neutrophils Relative % 64 %   Neutro Abs 5.8 1.7 - 7.7 K/uL   Lymphocytes Relative 23 %   Lymphs Abs 2.1 0.7 - 4.0 K/uL   Monocytes Relative 9 %   Monocytes Absolute 0.8 0.1 -  1.0 K/uL   Eosinophils Relative 3 %   Eosinophils Absolute 0.3 0.0 - 0.5 K/uL   Basophils Relative 1 %   Basophils Absolute 0.1 0.0 - 0.1 K/uL   Immature Granulocytes 0 %   Abs Immature Granulocytes 0.03 0.00 - 0.07 K/uL  Comprehensive metabolic panel   Collection Time: 06/02/24  6:29 PM  Result Value Ref Range   Sodium 138 135 - 145 mmol/L   Potassium 4.0 3.5 - 5.1 mmol/L   Chloride 109 98 - 111 mmol/L   CO2 19 (L) 22 - 32 mmol/L   Glucose, Bld 86 70 - 99 mg/dL   BUN 16 8 - 23 mg/dL   Creatinine, Ser 9.06 0.44 - 1.00 mg/dL   Calcium  10.5 (H) 8.9 - 10.3 mg/dL   Total Protein 7.2 6.5 - 8.1 g/dL   Albumin 3.5 3.5 - 5.0 g/dL   AST 30 15 - 41 U/L   ALT 23 0 - 44 U/L   Alkaline Phosphatase 118 38 - 126 U/L   Total Bilirubin 0.6 0.0 - 1.2 mg/dL   GFR, Estimated >39 >39 mL/min   Anion gap 10 5 - 15  Urinalysis, Routine w reflex microscopic -Urine, Unspecified Source   Collection Time: 06/02/24  6:29 PM  Result Value Ref Range   Color, Urine YELLOW YELLOW   APPearance CLOUDY (A) CLEAR   Specific Gravity, Urine 1.005 1.005 - 1.030   pH 7.0 5.0 - 8.0   Glucose, UA NEGATIVE NEGATIVE mg/dL   Hgb urine dipstick MODERATE (A) NEGATIVE   Bilirubin Urine NEGATIVE NEGATIVE   Ketones, ur NEGATIVE NEGATIVE mg/dL   Protein, ur 899 (A) NEGATIVE mg/dL   Nitrite NEGATIVE NEGATIVE   Leukocytes,Ua MODERATE (A) NEGATIVE   RBC / HPF 21-50 0 - 5 RBC/hpf   WBC, UA >50 0 - 5 WBC/hpf   Bacteria, UA MANY (A) NONE SEEN   Squamous Epithelial / HPF 0-5 0 - 5 /HPF   WBC Clumps PRESENT   CBG monitoring, ED   Collection Time: 06/02/24  6:52 PM  Result Value Ref Range   Glucose-Capillary 83 70 - 99 mg/dL   CT Head Wo Contrast Result Date: 06/02/2024 CLINICAL DATA:  Mental status change,  unknown cause EXAM: CT HEAD WITHOUT CONTRAST TECHNIQUE: Contiguous axial images were obtained from the base of the skull through the vertex without intravenous contrast. RADIATION DOSE REDUCTION: This exam was performed according to the departmental dose-optimization program which includes automated exposure control, adjustment of the mA and/or kV according to patient size and/or use of iterative reconstruction technique. COMPARISON:  Head CT 04/20/2024 FINDINGS: Brain: No intracranial hemorrhage, mass effect, or midline shift. No hydrocephalus. The basilar cisterns are patent. Stable appearance of chronic small vessel ischemia. 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 No evidence of territorial infarct or acute ischemia. No extra-axial or intracranial fluid collection. Vascular: Atherosclerosis of skullbase vasculature without hyperdense vessel or abnormal calcification. Skull: No fracture or focal lesion. Sinuses/Orbits: Paranasal sinuses and mastoid air cells are clear. The visualized orbits are unremarkable. Other: None. IMPRESSION: 1. No acute intracranial abnormality. 2. Stable atrophy and chronic ischemia. Electronically Signed   By: Andrea Gasman M.D.   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 RADIATION DOSE REDUCTION: This exam was performed according  to the departmental dose-optimization program which includes automated exposure control, adjustment of the mA and/or kV according to patient size and/or use of iterative reconstruction technique. COMPARISON:  Head CT 04/20/2024 FINDINGS: Brain: No intracranial hemorrhage, mass effect, or midline shift. No hydrocephalus. The basilar cisterns are patent. Stable appearance of chronic small vessel ischemia. 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 No evidence of territorial infarct or acute ischemia. No extra-axial or intracranial fluid collection. Vascular: Atherosclerosis of skullbase vasculature without hyperdense vessel or abnormal calcification. Skull: No fracture or focal lesion. Sinuses/Orbits: Paranasal sinuses and mastoid air cells are clear. The visualized orbits are unremarkable. Other: None. IMPRESSION: 1. No acute intracranial abnormality. 2. Stable atrophy and chronic ischemia. Electronically Signed   By: Andrea Gasman M.D.   On: 06/02/2024 19:52     Procedures   Medications Ordered in the ED  metoprolol  tartrate (LOPRESSOR ) tablet 12.5 mg (has no administration in time range)  cefTRIAXone  (ROCEPHIN ) 1 g in sodium chloride  0.9 % 100 mL IVPB (has no administration in time range)  lactated ringers  bolus 1,000 mL (0 mLs Intravenous Stopped 06/02/24 2241)                                    Medical Decision Making Problems Addressed: Acute UTI: acute illness or injury Elevated blood pressure reading: acute illness or injury Essential hypertension: chronic illness or injury with exacerbation, progression, or side effects of treatment that poses a threat to life or bodily functions Generalized weakness: acute illness or injury with systemic symptoms that poses a threat to life or bodily functions History of dementia: chronic illness or injury that poses a threat to life or bodily functions  Amount and/or Complexity of Data Reviewed Independent Historian: EMS    Details:  hx External Data Reviewed: notes. Labs: ordered. Decision-making details documented in ED Course. Radiology: ordered and independent interpretation performed. Decision-making details documented in ED Course. ECG/medicine tests: ordered and independent interpretation performed. Decision-making details documented in ED Course.  Risk Prescription drug management. Decision regarding hospitalization.   Iv ns. Continuous pulse ox and cardiac monitoring. Labs ordered/sent. Imaging ordered.   Differential diagnosis includes dementia, altered ms, uti, etc. Dispo decision including potential need for admission considered - will get labs and imaging and reassess.   Reviewed nursing notes and prior charts for additional history. External reports reviewed. Additional history from: EMS.   Recent prior admission reviewed, and ID note from same - then was noted to have u cxs consistently positive, and not clear acute urinary symptoms (I.e. no abd or flank pain, no fevers, no nv, no specific gu c/o), ?possible colonization.   Cardiac monitor: sinus rhythm, rate 74.  Labs reviewed/interpreted by me - wbc and hct normal. Chem largely unremarkable.   CT reviewed/interpreted by me - no hem.   A few bp readings very high - on checking patient, cuff was not on/not on appropriately/patient lying on.  Rechecked, is 170/92. Pt given a dose of her bp med.  Pt is awake and alert, content, responsive.  Is provided po fluids/food. No abd or flank pain or tenderness. No fevers.   Additional labs reviewed/interpreted by me - ua w uti. Rocephin  iv. Rx levaquin .   Pt tolerating po, no distress. Abd soft nt. Afebrile. Pt currently appears stable for ed d/c.   Rec close pcp f/u.  Return precautions provided.       Final diagnoses:  None    ED Discharge Orders     None          Bernard Drivers, MD 06/02/24 2348

## 2024-06-02 NOTE — ED Triage Notes (Signed)
 Patient arrives via [facility] for possible UTI. Per facility patient is not acting at baseline, weak, lethargic, and less sociable, hx of UTI. Presenting the same per facility when UTI is present. Patient endorses headache. Alert and oriented x2, at baseline.   EMS vitals 166/90 HR 60 RR 16 94 CBG

## 2024-06-02 NOTE — ED Provider Triage Note (Signed)
 Emergency Medicine Provider Triage Evaluation Note  Heidi Blevins , a 73 y.o. female  was evaluated in triage.  Pt complains of altered mental status, brought in by EMS for decreased mentation, increased generalized weakness and lethargy.  Has a known history of UTI and the facility where she lives was concerned that she may have a UTI.  Normally A/O x 2.  Review of Systems  Positive: As above Negative:   Physical Exam  BP (!) 148/90   Pulse 64   Temp 98.5 F (36.9 C) (Oral)   Resp 17   Ht 5' (1.524 m)   Wt 45 kg   SpO2 100%   BMI 19.38 kg/m  Gen:   Awake, no distress   Resp:  Normal effort  MSK:   Moves extremities without difficulty  Other:  Oriented to person, slow to respond, lethargic.  Medical Decision Making  Medically screening exam initiated at 6:30 PM.  Appropriate orders placed.  Heidi Blevins was informed that the remainder of the evaluation will be completed by another provider, this initial triage assessment does not replace that evaluation, and the importance of remaining in the ED until their evaluation is complete.  Initial labs and imaging placed for altered LOC.   Heidi Blevins, Heidi Blevins 06/02/24 [phone]

## 2024-06-03 ENCOUNTER — Other Ambulatory Visit (HOSPITAL_COMMUNITY): Payer: Self-pay

## 2024-06-03 DIAGNOSIS — N390 Urinary tract infection, site not specified: Secondary | ICD-10-CM | POA: Diagnosis not present

## 2024-06-03 NOTE — ED Notes (Signed)
 [facility] called

## 2024-06-06 ENCOUNTER — Telehealth: Payer: Self-pay | Admitting: Hematology and Oncology

## 2024-06-06 NOTE — Telephone Encounter (Signed)
 Left [facility] a voicemail with the scheduled and rescheduled appointment details with a  call back number with any questions or to reschedule.

## 2024-06-09 ENCOUNTER — Inpatient Hospital Stay

## 2024-06-12 ENCOUNTER — Encounter: Payer: Self-pay | Admitting: Urology

## 2024-06-12 ENCOUNTER — Other Ambulatory Visit: Payer: Self-pay | Admitting: Physician Assistant

## 2024-06-12 DIAGNOSIS — D464 Refractory anemia, unspecified: Secondary | ICD-10-CM

## 2024-06-13 ENCOUNTER — Inpatient Hospital Stay

## 2024-06-13 ENCOUNTER — Inpatient Hospital Stay: Admitting: Physician Assistant

## 2024-06-16 ENCOUNTER — Encounter: Payer: Self-pay | Admitting: Urology

## 2024-06-23 ENCOUNTER — Other Ambulatory Visit: Payer: Self-pay | Admitting: Urology

## 2024-06-23 DIAGNOSIS — D49512 Neoplasm of unspecified behavior of left kidney: Secondary | ICD-10-CM

## 2024-07-28 ENCOUNTER — Encounter

## 2024-07-29 ENCOUNTER — Encounter

## 2024-08-06 ENCOUNTER — Other Ambulatory Visit (HOSPITAL_COMMUNITY): Payer: Self-pay | Admitting: Urology

## 2024-08-06 DIAGNOSIS — D49519 Neoplasm of unspecified behavior of unspecified kidney: Secondary | ICD-10-CM

## 2024-08-26 ENCOUNTER — Ambulatory Visit (HOSPITAL_COMMUNITY)
Admission: RE | Admit: 2024-08-26 | Discharge: 2024-08-26 | Disposition: A | Discharge status: Home / Self Care | Source: Ambulatory Visit | Attending: Urology | Admitting: Urology

## 2024-08-26 DIAGNOSIS — D49519 Neoplasm of unspecified behavior of unspecified kidney: Secondary | ICD-10-CM | POA: Insufficient documentation

## 2024-08-26 MED ORDER — IOHEXOL 300 MG/ML  SOLN
100.0000 mL | Freq: Once | INTRAMUSCULAR | Status: AC | PRN
  Administered 2024-08-26: 100 mL via INTRAVENOUS
